# Patient Record
Sex: Female | Born: 1945 | Race: White | Hispanic: No | Smoking: Former smoker
Health system: Southern US, Community
[De-identification: ages and names within clinical notes are randomized; demographics above are authoritative.]

## PROBLEM LIST (undated history)

## (undated) DIAGNOSIS — J45909 Unspecified asthma, uncomplicated: Secondary | ICD-10-CM

## (undated) DIAGNOSIS — R413 Other amnesia: Secondary | ICD-10-CM

## (undated) DIAGNOSIS — F32A Depression, unspecified: Secondary | ICD-10-CM

## (undated) DIAGNOSIS — M549 Dorsalgia, unspecified: Secondary | ICD-10-CM

## (undated) DIAGNOSIS — G3184 Mild cognitive impairment, so stated: Secondary | ICD-10-CM

## (undated) DIAGNOSIS — R296 Repeated falls: Secondary | ICD-10-CM

## (undated) DIAGNOSIS — F419 Anxiety disorder, unspecified: Secondary | ICD-10-CM

## (undated) DIAGNOSIS — E039 Hypothyroidism, unspecified: Secondary | ICD-10-CM

## (undated) DIAGNOSIS — I10 Essential (primary) hypertension: Secondary | ICD-10-CM

## (undated) DIAGNOSIS — K529 Noninfective gastroenteritis and colitis, unspecified: Secondary | ICD-10-CM

## (undated) DIAGNOSIS — T7840XA Allergy, unspecified, initial encounter: Secondary | ICD-10-CM

## (undated) DIAGNOSIS — K219 Gastro-esophageal reflux disease without esophagitis: Secondary | ICD-10-CM

## (undated) DIAGNOSIS — F329 Major depressive disorder, single episode, unspecified: Secondary | ICD-10-CM

## (undated) DIAGNOSIS — E785 Hyperlipidemia, unspecified: Secondary | ICD-10-CM

## (undated) DIAGNOSIS — R011 Cardiac murmur, unspecified: Secondary | ICD-10-CM

## (undated) DIAGNOSIS — N281 Cyst of kidney, acquired: Secondary | ICD-10-CM

## (undated) DIAGNOSIS — Z8719 Personal history of other diseases of the digestive system: Secondary | ICD-10-CM

## (undated) DIAGNOSIS — K5790 Diverticulosis of intestine, part unspecified, without perforation or abscess without bleeding: Secondary | ICD-10-CM

## (undated) DIAGNOSIS — T8859XA Other complications of anesthesia, initial encounter: Secondary | ICD-10-CM

## (undated) DIAGNOSIS — K635 Polyp of colon: Secondary | ICD-10-CM

## (undated) DIAGNOSIS — F988 Other specified behavioral and emotional disorders with onset usually occurring in childhood and adolescence: Secondary | ICD-10-CM

## (undated) DIAGNOSIS — T4145XA Adverse effect of unspecified anesthetic, initial encounter: Secondary | ICD-10-CM

## (undated) DIAGNOSIS — M1990 Unspecified osteoarthritis, unspecified site: Secondary | ICD-10-CM

## (undated) DIAGNOSIS — E079 Disorder of thyroid, unspecified: Secondary | ICD-10-CM

## (undated) DIAGNOSIS — K589 Irritable bowel syndrome without diarrhea: Secondary | ICD-10-CM

## (undated) HISTORY — PX: HIP FRACTURE SURGERY: SHX118

## (undated) HISTORY — DX: Polyp of colon: K63.5

## (undated) HISTORY — PX: COLONOSCOPY: SHX174

## (undated) HISTORY — PX: CARPAL TUNNEL RELEASE: SHX101

## (undated) HISTORY — DX: Repeated falls: R29.6

## (undated) HISTORY — DX: Other amnesia: R41.3

## (undated) HISTORY — DX: Other specified behavioral and emotional disorders with onset usually occurring in childhood and adolescence: F98.8

## (undated) HISTORY — DX: Depression, unspecified: F32.A

## (undated) HISTORY — DX: Essential (primary) hypertension: I10

## (undated) HISTORY — DX: Hyperlipidemia, unspecified: E78.5

## (undated) HISTORY — DX: Disorder of thyroid, unspecified: E07.9

## (undated) HISTORY — PX: BREAST SURGERY: SHX581

## (undated) HISTORY — DX: Unspecified osteoarthritis, unspecified site: M19.90

## (undated) HISTORY — DX: Mild cognitive impairment of uncertain or unknown etiology: G31.84

## (undated) HISTORY — DX: Anxiety disorder, unspecified: F41.9

## (undated) HISTORY — DX: Irritable bowel syndrome, unspecified: K58.9

## (undated) HISTORY — PX: CHOLECYSTECTOMY: SHX55

## (undated) HISTORY — DX: Unspecified asthma, uncomplicated: J45.909

## (undated) HISTORY — DX: Allergy, unspecified, initial encounter: T78.40XA

## (undated) HISTORY — DX: Major depressive disorder, single episode, unspecified: F32.9

## (undated) HISTORY — DX: Gastro-esophageal reflux disease without esophagitis: K21.9

## (undated) HISTORY — DX: Noninfective gastroenteritis and colitis, unspecified: K52.9

## (undated) HISTORY — PX: TOE SURGERY: SHX1073

## (undated) HISTORY — DX: Diverticulosis of intestine, part unspecified, without perforation or abscess without bleeding: K57.90

## (undated) HISTORY — PX: BACK SURGERY: SHX140

## (undated) HISTORY — PX: SHOULDER SURGERY: SHX246

## (undated) HISTORY — PX: TOTAL SHOULDER ARTHROPLASTY: SHX126

---

## 1997-09-28 ENCOUNTER — Ambulatory Visit (HOSPITAL_COMMUNITY): Admission: RE | Admit: 1997-09-28 | Discharge: 1997-09-28 | Payer: Self-pay | Admitting: Neurosurgery

## 1998-09-27 ENCOUNTER — Other Ambulatory Visit: Admission: RE | Admit: 1998-09-27 | Discharge: 1998-09-27 | Payer: Self-pay | Admitting: Gynecology

## 1998-11-28 ENCOUNTER — Ambulatory Visit (HOSPITAL_COMMUNITY): Admission: RE | Admit: 1998-11-28 | Discharge: 1998-11-28 | Payer: Self-pay | Admitting: Gynecology

## 1999-09-07 ENCOUNTER — Encounter: Payer: Self-pay | Admitting: Internal Medicine

## 1999-09-07 ENCOUNTER — Ambulatory Visit (HOSPITAL_COMMUNITY): Admission: RE | Admit: 1999-09-07 | Discharge: 1999-09-07 | Payer: Self-pay | Admitting: Internal Medicine

## 1999-10-12 ENCOUNTER — Other Ambulatory Visit: Admission: RE | Admit: 1999-10-12 | Discharge: 1999-10-12 | Payer: Self-pay | Admitting: Gynecology

## 1999-10-12 ENCOUNTER — Encounter (INDEPENDENT_AMBULATORY_CARE_PROVIDER_SITE_OTHER): Payer: Self-pay

## 2000-03-23 ENCOUNTER — Ambulatory Visit (HOSPITAL_COMMUNITY): Admission: RE | Admit: 2000-03-23 | Discharge: 2000-03-23 | Payer: Self-pay | Admitting: Neurosurgery

## 2000-03-23 ENCOUNTER — Encounter: Payer: Self-pay | Admitting: Neurosurgery

## 2000-03-25 ENCOUNTER — Encounter: Admission: RE | Admit: 2000-03-25 | Discharge: 2000-03-25 | Payer: Self-pay | Admitting: Neurosurgery

## 2000-03-25 ENCOUNTER — Encounter: Payer: Self-pay | Admitting: Neurosurgery

## 2000-04-01 ENCOUNTER — Ambulatory Visit (HOSPITAL_COMMUNITY): Admission: RE | Admit: 2000-04-01 | Discharge: 2000-04-01 | Payer: Self-pay | Admitting: Neurosurgery

## 2000-04-01 ENCOUNTER — Encounter: Payer: Self-pay | Admitting: Neurosurgery

## 2000-04-15 ENCOUNTER — Encounter: Payer: Self-pay | Admitting: Neurosurgery

## 2000-04-15 ENCOUNTER — Ambulatory Visit (HOSPITAL_COMMUNITY): Admission: RE | Admit: 2000-04-15 | Discharge: 2000-04-15 | Payer: Self-pay | Admitting: Neurosurgery

## 2000-04-29 ENCOUNTER — Encounter: Payer: Self-pay | Admitting: Neurosurgery

## 2000-04-29 ENCOUNTER — Ambulatory Visit (HOSPITAL_COMMUNITY): Admission: RE | Admit: 2000-04-29 | Discharge: 2000-04-29 | Payer: Self-pay | Admitting: Neurosurgery

## 2001-08-26 ENCOUNTER — Ambulatory Visit (HOSPITAL_COMMUNITY): Admission: RE | Admit: 2001-08-26 | Discharge: 2001-08-26 | Payer: Self-pay | Admitting: Orthopedic Surgery

## 2001-08-26 ENCOUNTER — Encounter: Payer: Self-pay | Admitting: Orthopedic Surgery

## 2003-08-23 ENCOUNTER — Encounter: Admission: RE | Admit: 2003-08-23 | Discharge: 2003-08-23 | Payer: Self-pay | Admitting: Orthopedic Surgery

## 2003-11-10 ENCOUNTER — Encounter: Admission: RE | Admit: 2003-11-10 | Discharge: 2003-11-10 | Payer: Self-pay | Admitting: Orthopedic Surgery

## 2004-05-02 ENCOUNTER — Ambulatory Visit: Payer: Self-pay | Admitting: Internal Medicine

## 2004-10-11 ENCOUNTER — Ambulatory Visit: Payer: Self-pay | Admitting: Internal Medicine

## 2004-12-21 ENCOUNTER — Ambulatory Visit: Payer: Self-pay | Admitting: Internal Medicine

## 2005-01-21 ENCOUNTER — Ambulatory Visit: Payer: Self-pay | Admitting: Gastroenterology

## 2005-01-24 ENCOUNTER — Ambulatory Visit: Payer: Self-pay | Admitting: Gastroenterology

## 2005-01-24 ENCOUNTER — Ambulatory Visit (HOSPITAL_COMMUNITY): Admission: RE | Admit: 2005-01-24 | Discharge: 2005-01-24 | Payer: Self-pay | Admitting: Gastroenterology

## 2005-02-12 ENCOUNTER — Ambulatory Visit: Payer: Self-pay | Admitting: Gastroenterology

## 2005-02-19 ENCOUNTER — Ambulatory Visit: Payer: Self-pay | Admitting: Gastroenterology

## 2005-02-19 ENCOUNTER — Encounter (INDEPENDENT_AMBULATORY_CARE_PROVIDER_SITE_OTHER): Payer: Self-pay | Admitting: *Deleted

## 2005-02-23 ENCOUNTER — Emergency Department (HOSPITAL_COMMUNITY): Admission: EM | Admit: 2005-02-23 | Discharge: 2005-02-23 | Payer: Self-pay | Admitting: Emergency Medicine

## 2005-02-26 ENCOUNTER — Ambulatory Visit (HOSPITAL_COMMUNITY): Admission: RE | Admit: 2005-02-26 | Discharge: 2005-02-27 | Payer: Self-pay | Admitting: General Surgery

## 2005-02-26 ENCOUNTER — Encounter (INDEPENDENT_AMBULATORY_CARE_PROVIDER_SITE_OTHER): Payer: Self-pay | Admitting: *Deleted

## 2005-12-19 ENCOUNTER — Other Ambulatory Visit: Admission: RE | Admit: 2005-12-19 | Discharge: 2005-12-19 | Payer: Self-pay | Admitting: Gynecology

## 2005-12-23 ENCOUNTER — Encounter: Payer: Self-pay | Admitting: Internal Medicine

## 2006-01-06 ENCOUNTER — Ambulatory Visit: Payer: Self-pay | Admitting: Internal Medicine

## 2006-06-04 ENCOUNTER — Ambulatory Visit: Payer: Self-pay | Admitting: Internal Medicine

## 2006-11-19 ENCOUNTER — Ambulatory Visit: Payer: Self-pay | Admitting: Internal Medicine

## 2006-11-21 ENCOUNTER — Ambulatory Visit: Payer: Self-pay | Admitting: Internal Medicine

## 2006-11-21 LAB — CONVERTED CEMR LAB
Basophils Relative: 0.1 % (ref 0.0–1.0)
Eosinophils Absolute: 0.4 10*3/uL (ref 0.0–0.6)
HCT: 35.6 % — ABNORMAL LOW (ref 36.0–46.0)
Hemoglobin: 12.1 g/dL (ref 12.0–15.0)
Lymphocytes Relative: 20.2 % (ref 12.0–46.0)
MCHC: 33.9 g/dL (ref 30.0–36.0)
MCV: 90.9 fL (ref 78.0–100.0)
Monocytes Absolute: 0.4 10*3/uL (ref 0.2–0.7)
Neutro Abs: 4.3 10*3/uL (ref 1.4–7.7)
RDW: 12.4 % (ref 11.5–14.6)
Sed Rate: 2 mm/hr (ref 0–22)
WBC: 6.4 10*3/uL (ref 4.5–10.5)

## 2007-01-28 ENCOUNTER — Ambulatory Visit: Payer: Self-pay | Admitting: Psychology

## 2007-01-28 ENCOUNTER — Ambulatory Visit: Payer: Self-pay | Admitting: Internal Medicine

## 2007-01-30 ENCOUNTER — Ambulatory Visit: Payer: Self-pay | Admitting: Licensed Clinical Social Worker

## 2007-02-03 ENCOUNTER — Ambulatory Visit: Payer: Self-pay | Admitting: Licensed Clinical Social Worker

## 2007-02-06 ENCOUNTER — Ambulatory Visit: Payer: Self-pay | Admitting: Licensed Clinical Social Worker

## 2007-02-10 ENCOUNTER — Ambulatory Visit: Payer: Self-pay | Admitting: Licensed Clinical Social Worker

## 2007-02-12 ENCOUNTER — Ambulatory Visit: Payer: Self-pay | Admitting: Licensed Clinical Social Worker

## 2007-02-17 ENCOUNTER — Ambulatory Visit: Payer: Self-pay | Admitting: Licensed Clinical Social Worker

## 2007-02-20 ENCOUNTER — Ambulatory Visit: Payer: Self-pay | Admitting: Licensed Clinical Social Worker

## 2007-03-11 ENCOUNTER — Ambulatory Visit: Payer: Self-pay | Admitting: Licensed Clinical Social Worker

## 2007-03-16 ENCOUNTER — Ambulatory Visit: Payer: Self-pay | Admitting: Licensed Clinical Social Worker

## 2007-03-18 ENCOUNTER — Ambulatory Visit: Payer: Self-pay | Admitting: Licensed Clinical Social Worker

## 2007-03-20 ENCOUNTER — Encounter: Payer: Self-pay | Admitting: Internal Medicine

## 2007-03-20 ENCOUNTER — Ambulatory Visit: Payer: Self-pay | Admitting: Internal Medicine

## 2007-03-21 DIAGNOSIS — F988 Other specified behavioral and emotional disorders with onset usually occurring in childhood and adolescence: Secondary | ICD-10-CM

## 2007-03-21 DIAGNOSIS — K589 Irritable bowel syndrome without diarrhea: Secondary | ICD-10-CM

## 2007-03-21 DIAGNOSIS — Z8679 Personal history of other diseases of the circulatory system: Secondary | ICD-10-CM | POA: Insufficient documentation

## 2007-03-21 DIAGNOSIS — F411 Generalized anxiety disorder: Secondary | ICD-10-CM

## 2007-03-21 DIAGNOSIS — E039 Hypothyroidism, unspecified: Secondary | ICD-10-CM

## 2007-03-21 DIAGNOSIS — F329 Major depressive disorder, single episode, unspecified: Secondary | ICD-10-CM

## 2007-03-21 DIAGNOSIS — I10 Essential (primary) hypertension: Secondary | ICD-10-CM

## 2007-03-21 DIAGNOSIS — R1013 Epigastric pain: Secondary | ICD-10-CM

## 2007-03-21 DIAGNOSIS — J45909 Unspecified asthma, uncomplicated: Secondary | ICD-10-CM

## 2007-03-21 DIAGNOSIS — K3189 Other diseases of stomach and duodenum: Secondary | ICD-10-CM

## 2007-03-23 ENCOUNTER — Ambulatory Visit: Payer: Self-pay | Admitting: Licensed Clinical Social Worker

## 2007-03-31 ENCOUNTER — Ambulatory Visit: Payer: Self-pay | Admitting: Licensed Clinical Social Worker

## 2007-04-02 ENCOUNTER — Ambulatory Visit: Payer: Self-pay | Admitting: Internal Medicine

## 2007-04-04 ENCOUNTER — Encounter: Payer: Self-pay | Admitting: Internal Medicine

## 2007-04-04 LAB — CONVERTED CEMR LAB: Dopamine 24 Hr Urine: 202 mcg/24hr (ref <500)

## 2007-04-15 ENCOUNTER — Encounter: Payer: Self-pay | Admitting: Internal Medicine

## 2007-04-15 ENCOUNTER — Other Ambulatory Visit: Admission: RE | Admit: 2007-04-15 | Discharge: 2007-04-15 | Payer: Self-pay | Admitting: Gynecology

## 2007-04-15 ENCOUNTER — Ambulatory Visit: Payer: Self-pay | Admitting: Internal Medicine

## 2007-04-17 ENCOUNTER — Ambulatory Visit: Payer: Self-pay | Admitting: Licensed Clinical Social Worker

## 2007-04-21 ENCOUNTER — Encounter: Admission: RE | Admit: 2007-04-21 | Discharge: 2007-04-21 | Payer: Self-pay | Admitting: Internal Medicine

## 2007-04-29 ENCOUNTER — Ambulatory Visit: Payer: Self-pay | Admitting: Licensed Clinical Social Worker

## 2007-05-08 ENCOUNTER — Ambulatory Visit: Payer: Self-pay | Admitting: Licensed Clinical Social Worker

## 2007-05-19 ENCOUNTER — Telehealth: Payer: Self-pay | Admitting: Internal Medicine

## 2007-05-25 ENCOUNTER — Ambulatory Visit: Payer: Self-pay | Admitting: Licensed Clinical Social Worker

## 2007-05-26 ENCOUNTER — Telehealth: Payer: Self-pay | Admitting: Internal Medicine

## 2007-05-26 ENCOUNTER — Ambulatory Visit: Payer: Self-pay | Admitting: Internal Medicine

## 2007-05-26 DIAGNOSIS — J309 Allergic rhinitis, unspecified: Secondary | ICD-10-CM | POA: Insufficient documentation

## 2007-05-28 ENCOUNTER — Telehealth: Payer: Self-pay | Admitting: Internal Medicine

## 2007-07-20 ENCOUNTER — Telehealth: Payer: Self-pay | Admitting: Internal Medicine

## 2007-09-16 ENCOUNTER — Telehealth: Payer: Self-pay | Admitting: Internal Medicine

## 2007-09-28 ENCOUNTER — Telehealth: Payer: Self-pay | Admitting: Internal Medicine

## 2007-10-12 ENCOUNTER — Encounter: Payer: Self-pay | Admitting: Internal Medicine

## 2007-10-30 ENCOUNTER — Telehealth: Payer: Self-pay | Admitting: Internal Medicine

## 2007-11-20 ENCOUNTER — Telehealth: Payer: Self-pay | Admitting: Internal Medicine

## 2008-01-12 ENCOUNTER — Telehealth: Payer: Self-pay | Admitting: Internal Medicine

## 2008-01-13 ENCOUNTER — Telehealth: Payer: Self-pay | Admitting: Internal Medicine

## 2008-01-15 ENCOUNTER — Ambulatory Visit: Payer: Self-pay | Admitting: Internal Medicine

## 2008-01-15 ENCOUNTER — Observation Stay (HOSPITAL_COMMUNITY): Admission: EM | Admit: 2008-01-15 | Discharge: 2008-01-16 | Payer: Self-pay | Admitting: *Deleted

## 2008-02-02 ENCOUNTER — Ambulatory Visit: Payer: Self-pay | Admitting: Internal Medicine

## 2008-02-02 ENCOUNTER — Telehealth: Payer: Self-pay | Admitting: Internal Medicine

## 2008-02-02 ENCOUNTER — Other Ambulatory Visit (HOSPITAL_COMMUNITY): Admission: RE | Admit: 2008-02-02 | Discharge: 2008-02-22 | Payer: Self-pay | Admitting: Psychiatry

## 2008-02-02 LAB — CONVERTED CEMR LAB
BUN: 18 mg/dL (ref 6–23)
Basophils Absolute: 0.1 10*3/uL (ref 0.0–0.1)
CO2: 29 meq/L (ref 19–32)
Calcium: 9.3 mg/dL (ref 8.4–10.5)
Chloride: 105 meq/L (ref 96–112)
Eosinophils Absolute: 0.4 10*3/uL (ref 0.0–0.7)
Eosinophils Relative: 5.1 % — ABNORMAL HIGH (ref 0.0–5.0)
Glucose, Bld: 87 mg/dL (ref 70–99)
HDL: 39.4 mg/dL (ref >39.0)
Hemoglobin: 13.2 g/dL (ref 12.0–15.0)
Lymphocytes Relative: 21.6 % (ref 12.0–46.0)
MCHC: 34.2 g/dL (ref 30.0–36.0)
Monocytes Absolute: 0.6 10*3/uL (ref 0.1–1.0)
Neutrophils Relative %: 64.6 % (ref 43.0–77.0)
Sodium: 140 meq/L (ref 135–145)
Total CHOL/HDL Ratio: 5.7
VLDL: 18 mg/dL (ref 0–40)

## 2008-02-03 ENCOUNTER — Ambulatory Visit: Payer: Self-pay | Admitting: Psychiatry

## 2008-02-05 ENCOUNTER — Encounter: Payer: Self-pay | Admitting: Internal Medicine

## 2008-02-19 ENCOUNTER — Ambulatory Visit: Payer: Self-pay | Admitting: Internal Medicine

## 2008-02-19 DIAGNOSIS — E785 Hyperlipidemia, unspecified: Secondary | ICD-10-CM

## 2008-05-06 ENCOUNTER — Telehealth: Payer: Self-pay | Admitting: Internal Medicine

## 2008-05-12 ENCOUNTER — Ambulatory Visit: Payer: Self-pay | Admitting: Internal Medicine

## 2008-05-12 ENCOUNTER — Telehealth: Payer: Self-pay | Admitting: Internal Medicine

## 2008-05-12 LAB — CONVERTED CEMR LAB
Bilirubin Urine: NEGATIVE
Crystals: NEGATIVE
Hemoglobin, Urine: NEGATIVE
Ketones, ur: NEGATIVE mg/dL
Urine Glucose: NEGATIVE mg/dL
Urobilinogen, UA: 0.2 (ref 0.0–1.0)

## 2008-05-17 ENCOUNTER — Telehealth: Payer: Self-pay | Admitting: Internal Medicine

## 2008-05-18 ENCOUNTER — Ambulatory Visit: Payer: Self-pay | Admitting: Internal Medicine

## 2008-05-18 LAB — CONVERTED CEMR LAB
Bacteria, UA: NEGATIVE
Bilirubin Urine: NEGATIVE
Hemoglobin, Urine: NEGATIVE
Ketones, ur: NEGATIVE mg/dL
Mucus, UA: NEGATIVE
RBC / HPF: NONE SEEN
Total Protein, Urine: NEGATIVE mg/dL

## 2008-05-25 ENCOUNTER — Telehealth: Payer: Self-pay | Admitting: Internal Medicine

## 2008-05-26 ENCOUNTER — Ambulatory Visit: Payer: Self-pay | Admitting: Internal Medicine

## 2008-05-26 LAB — CONVERTED CEMR LAB
Bilirubin Urine: NEGATIVE
Total Protein, Urine: NEGATIVE mg/dL
Urine Glucose: NEGATIVE mg/dL
Urobilinogen, UA: 0.2 (ref 0.0–1.0)

## 2008-05-27 ENCOUNTER — Encounter: Payer: Self-pay | Admitting: Internal Medicine

## 2008-05-30 ENCOUNTER — Other Ambulatory Visit: Admission: RE | Admit: 2008-05-30 | Discharge: 2008-05-30 | Payer: Self-pay | Admitting: Gynecology

## 2008-05-30 ENCOUNTER — Encounter: Payer: Self-pay | Admitting: Gynecology

## 2008-05-30 ENCOUNTER — Ambulatory Visit: Payer: Self-pay | Admitting: Gynecology

## 2008-06-02 ENCOUNTER — Telehealth: Payer: Self-pay | Admitting: Internal Medicine

## 2008-06-27 ENCOUNTER — Telehealth (INDEPENDENT_AMBULATORY_CARE_PROVIDER_SITE_OTHER): Payer: Self-pay | Admitting: *Deleted

## 2008-06-28 ENCOUNTER — Ambulatory Visit: Payer: Self-pay | Admitting: Internal Medicine

## 2008-07-22 ENCOUNTER — Telehealth: Payer: Self-pay | Admitting: Internal Medicine

## 2008-08-10 ENCOUNTER — Encounter: Payer: Self-pay | Admitting: Internal Medicine

## 2008-08-10 ENCOUNTER — Telehealth: Payer: Self-pay | Admitting: Internal Medicine

## 2008-09-14 ENCOUNTER — Telehealth: Payer: Self-pay | Admitting: Internal Medicine

## 2008-09-15 ENCOUNTER — Encounter: Payer: Self-pay | Admitting: Internal Medicine

## 2008-09-23 LAB — CONVERTED CEMR LAB: Pap Smear: NORMAL

## 2008-10-05 ENCOUNTER — Telehealth: Payer: Self-pay | Admitting: Internal Medicine

## 2008-10-24 ENCOUNTER — Telehealth: Payer: Self-pay | Admitting: Internal Medicine

## 2008-10-30 ENCOUNTER — Encounter: Payer: Self-pay | Admitting: Internal Medicine

## 2008-11-09 ENCOUNTER — Telehealth: Payer: Self-pay | Admitting: Internal Medicine

## 2008-11-25 ENCOUNTER — Ambulatory Visit (HOSPITAL_COMMUNITY): Admission: RE | Admit: 2008-11-25 | Discharge: 2008-11-28 | Payer: Self-pay | Admitting: Orthopedic Surgery

## 2008-11-26 ENCOUNTER — Ambulatory Visit: Payer: Self-pay | Admitting: Internal Medicine

## 2008-11-30 ENCOUNTER — Telehealth: Payer: Self-pay | Admitting: Internal Medicine

## 2009-01-13 ENCOUNTER — Encounter: Payer: Self-pay | Admitting: Internal Medicine

## 2009-01-16 ENCOUNTER — Telehealth: Payer: Self-pay | Admitting: Internal Medicine

## 2009-01-18 ENCOUNTER — Telehealth: Payer: Self-pay | Admitting: Internal Medicine

## 2009-01-19 ENCOUNTER — Encounter: Payer: Self-pay | Admitting: Internal Medicine

## 2009-02-14 ENCOUNTER — Telehealth: Payer: Self-pay | Admitting: Internal Medicine

## 2009-03-08 ENCOUNTER — Telehealth: Payer: Self-pay | Admitting: Internal Medicine

## 2009-03-14 ENCOUNTER — Telehealth: Payer: Self-pay | Admitting: Internal Medicine

## 2009-03-22 ENCOUNTER — Ambulatory Visit: Payer: Self-pay | Admitting: Internal Medicine

## 2009-03-28 ENCOUNTER — Encounter: Admission: RE | Admit: 2009-03-28 | Discharge: 2009-03-28 | Payer: Self-pay | Admitting: Orthopedic Surgery

## 2009-03-29 ENCOUNTER — Ambulatory Visit: Payer: Self-pay | Admitting: Internal Medicine

## 2009-03-29 LAB — CONVERTED CEMR LAB
BUN: 12 mg/dL (ref 6–23)
Basophils Absolute: 0.1 10*3/uL (ref 0.0–0.1)
Basophils Relative: 0.8 % (ref 0.0–3.0)
CO2: 29 meq/L (ref 19–32)
Calcium: 9.5 mg/dL (ref 8.4–10.5)
Creatinine, Ser: 1 mg/dL (ref 0.4–1.2)
Eosinophils Absolute: 0.3 10*3/uL (ref 0.0–0.7)
GFR calc non Af Amer: 59.46 mL/min (ref >60)
HCT: 39.7 % (ref 36.0–46.0)
Hemoglobin: 13.5 g/dL (ref 12.0–15.0)
Lymphs Abs: 1.8 10*3/uL (ref 0.7–4.0)
MCV: 88.2 fL (ref 78.0–100.0)
Monocytes Absolute: 0.7 10*3/uL (ref 0.1–1.0)
Monocytes Relative: 9.1 % (ref 3.0–12.0)
Neutro Abs: 4.7 10*3/uL (ref 1.4–7.7)
Neutrophils Relative %: 62.5 % (ref 43.0–77.0)
Sodium: 141 meq/L (ref 135–145)
TSH: 2.22 microintl units/mL (ref 0.35–5.50)
Total CHOL/HDL Ratio: 3
Total Protein: 6.5 g/dL (ref 6.0–8.3)
Triglycerides: 68 mg/dL (ref 0.0–149.0)
VLDL: 13.6 mg/dL (ref 0.0–40.0)

## 2009-05-02 ENCOUNTER — Ambulatory Visit: Payer: Self-pay | Admitting: Internal Medicine

## 2009-05-02 LAB — CONVERTED CEMR LAB
Basophils Absolute: 0.1 10*3/uL (ref 0.0–0.1)
Basophils Relative: 1.5 % (ref 0.0–3.0)
CO2: 29 meq/L (ref 19–32)
Creatinine, Ser: 0.9 mg/dL (ref 0.4–1.2)
GFR calc non Af Amer: 67.12 mL/min (ref >60)
Glucose, Bld: 101 mg/dL — ABNORMAL HIGH (ref 70–99)
Lymphocytes Relative: 20.5 % (ref 12.0–46.0)
Lymphs Abs: 1.4 10*3/uL (ref 0.7–4.0)
MCHC: 34 g/dL (ref 30.0–36.0)
Monocytes Absolute: 0.7 10*3/uL (ref 0.1–1.0)
Monocytes Relative: 11 % (ref 3.0–12.0)
Neutrophils Relative %: 60.8 % (ref 43.0–77.0)
WBC: 6.6 10*3/uL (ref 4.5–10.5)

## 2009-05-03 ENCOUNTER — Ambulatory Visit: Payer: Self-pay | Admitting: Internal Medicine

## 2009-05-04 ENCOUNTER — Telehealth: Payer: Self-pay | Admitting: Internal Medicine

## 2009-05-10 ENCOUNTER — Telehealth: Payer: Self-pay | Admitting: Internal Medicine

## 2009-06-14 ENCOUNTER — Telehealth (INDEPENDENT_AMBULATORY_CARE_PROVIDER_SITE_OTHER): Payer: Self-pay | Admitting: *Deleted

## 2009-06-22 ENCOUNTER — Telehealth: Payer: Self-pay | Admitting: Internal Medicine

## 2009-09-01 ENCOUNTER — Telehealth: Payer: Self-pay | Admitting: Internal Medicine

## 2009-09-13 ENCOUNTER — Telehealth: Payer: Self-pay | Admitting: Internal Medicine

## 2009-10-20 ENCOUNTER — Telehealth: Payer: Self-pay | Admitting: Internal Medicine

## 2009-10-24 ENCOUNTER — Ambulatory Visit: Payer: Self-pay | Admitting: Internal Medicine

## 2009-11-29 ENCOUNTER — Telehealth: Payer: Self-pay | Admitting: Internal Medicine

## 2009-12-21 ENCOUNTER — Ambulatory Visit: Payer: Self-pay | Admitting: Internal Medicine

## 2009-12-21 LAB — CONVERTED CEMR LAB
Anti Nuclear Antibody(ANA): NEGATIVE
Sed Rate: 5 mm/hr (ref 0–22)

## 2009-12-23 ENCOUNTER — Encounter: Payer: Self-pay | Admitting: Internal Medicine

## 2010-01-22 ENCOUNTER — Telehealth: Payer: Self-pay | Admitting: Internal Medicine

## 2010-01-24 ENCOUNTER — Encounter: Payer: Self-pay | Admitting: Gastroenterology

## 2010-01-25 ENCOUNTER — Ambulatory Visit: Payer: Self-pay | Admitting: Internal Medicine

## 2010-01-28 ENCOUNTER — Telehealth: Payer: Self-pay | Admitting: Internal Medicine

## 2010-02-06 ENCOUNTER — Encounter: Payer: Self-pay | Admitting: Internal Medicine

## 2010-02-08 ENCOUNTER — Encounter: Payer: Self-pay | Admitting: Internal Medicine

## 2010-02-22 ENCOUNTER — Telehealth: Payer: Self-pay | Admitting: Internal Medicine

## 2010-02-28 ENCOUNTER — Telehealth: Payer: Self-pay | Admitting: Internal Medicine

## 2010-03-01 ENCOUNTER — Ambulatory Visit: Payer: Self-pay | Admitting: Internal Medicine

## 2010-03-01 DIAGNOSIS — R109 Unspecified abdominal pain: Secondary | ICD-10-CM | POA: Insufficient documentation

## 2010-04-02 ENCOUNTER — Telehealth: Payer: Self-pay | Admitting: Internal Medicine

## 2010-04-06 ENCOUNTER — Ambulatory Visit: Payer: Self-pay | Admitting: Internal Medicine

## 2010-04-06 ENCOUNTER — Telehealth: Payer: Self-pay | Admitting: Internal Medicine

## 2010-04-06 LAB — CONVERTED CEMR LAB
Bilirubin Urine: NEGATIVE
Ketones, ur: NEGATIVE mg/dL
Nitrite: POSITIVE
Urobilinogen, UA: 0.2 (ref 0.0–1.0)
pH: 6 (ref 5.0–8.0)

## 2010-04-18 ENCOUNTER — Telehealth: Payer: Self-pay | Admitting: Internal Medicine

## 2010-06-06 ENCOUNTER — Telehealth: Payer: Self-pay | Admitting: Internal Medicine

## 2010-06-08 ENCOUNTER — Telehealth: Payer: Self-pay | Admitting: Internal Medicine

## 2010-06-19 ENCOUNTER — Telehealth: Payer: Self-pay | Admitting: Internal Medicine

## 2010-06-27 ENCOUNTER — Telehealth: Payer: Self-pay | Admitting: Internal Medicine

## 2010-07-02 ENCOUNTER — Telehealth: Payer: Self-pay | Admitting: Internal Medicine

## 2010-07-04 ENCOUNTER — Telehealth: Payer: Self-pay | Admitting: Internal Medicine

## 2010-07-05 ENCOUNTER — Other Ambulatory Visit: Payer: Self-pay | Admitting: Internal Medicine

## 2010-07-05 ENCOUNTER — Ambulatory Visit
Admission: RE | Admit: 2010-07-05 | Discharge: 2010-07-05 | Payer: Self-pay | Source: Home / Self Care | Attending: Internal Medicine | Admitting: Internal Medicine

## 2010-07-05 ENCOUNTER — Telehealth: Payer: Self-pay | Admitting: Internal Medicine

## 2010-07-05 LAB — BASIC METABOLIC PANEL
BUN: 11 mg/dL (ref 6–23)
CO2: 27 mEq/L (ref 19–32)
Calcium: 8.8 mg/dL (ref 8.4–10.5)
Chloride: 104 mEq/L (ref 96–112)
Creatinine, Ser: 0.9 mg/dL (ref 0.4–1.2)
GFR: 68.63 mL/min (ref >60.00)
Glucose, Bld: 93 mg/dL (ref 70–99)
Potassium: 3.7 mEq/L (ref 3.5–5.1)
Sodium: 141 mEq/L (ref 135–145)

## 2010-07-05 LAB — TSH: TSH: 1.87 u[IU]/mL (ref 0.35–5.50)

## 2010-07-05 LAB — CBC WITH DIFFERENTIAL/PLATELET
Basophils Absolute: 0 10*3/uL (ref 0.0–0.1)
Basophils Relative: 0.7 % (ref 0.0–3.0)
Eosinophils Absolute: 0.2 10*3/uL (ref 0.0–0.7)
Eosinophils Relative: 2.8 % (ref 0.0–5.0)
HCT: 40 % (ref 36.0–46.0)
Hemoglobin: 13.5 g/dL (ref 12.0–15.0)
Lymphocytes Relative: 16.7 % (ref 12.0–46.0)
Lymphs Abs: 1.2 10*3/uL (ref 0.7–4.0)
MCHC: 33.8 g/dL (ref 30.0–36.0)
MCV: 91 fl (ref 78.0–100.0)
Monocytes Absolute: 0.5 10*3/uL (ref 0.1–1.0)
Monocytes Relative: 7.4 % (ref 3.0–12.0)
Neutro Abs: 5.1 10*3/uL (ref 1.4–7.7)
Neutrophils Relative %: 72.4 % (ref 43.0–77.0)
Platelets: 298 10*3/uL (ref 150.0–400.0)
RBC: 4.4 Mil/uL (ref 3.87–5.11)
RDW: 13.7 % (ref 11.5–14.6)
WBC: 7.1 10*3/uL (ref 4.5–10.5)

## 2010-07-06 ENCOUNTER — Telehealth: Payer: Self-pay | Admitting: Internal Medicine

## 2010-07-24 NOTE — Assessment & Plan Note (Signed)
Summary: abd pain /SD   Vital Signs:  Patient profile:   65 year old female Height:      69 inches Weight:      178 pounds BMI:     26.38 O2 Sat:      89 % on Room air Temp:     97.6 degrees F oral Pulse rate:   74 / minute BP sitting:   122 / 74  (left arm) Cuff size:   regular  Vitals Entered By: Glenda Chroman (March 01, 2010 4:41 PM)  O2 Flow:  Room air CC: pt has OV to discuss ongoing abdominal pain./cp sma    Primary Care Provider:  Norins  CC:  pt has OV to discuss ongoing abdominal pain./cp sma .  History of Present Illness: Patinet presents due to several year h/o abdominal pain and discomfort but worse over the past several weeks. She has a diagnosis of IBS but reports that hyoscymine has not helped. Chart reviewed: MR Abdomen in '08 negative. CT pelvis Oct '10 with question of diverticulitis otherwise normal. No intra-abdominal surgery.   She describes her discomfort as a dull grippe. She does not have bloating. She reports chronic constipation for which she is not taking a routine medication. She has had no hematochezia or melena. She has had a normal diet.   Current Medications (verified): 1)  Concerta 36 Mg Cr-Tabs (Methylphenidate Hcl) .[name].. 1 Tab Daily Fill On or After 03/22/2010 2)  Synthroid 100 Mcg  Tabs (Levothyroxine Sodium) .... Once Daily 3)  Ketoprofen Cr 200 Mg  Cp24 (Ketoprofen) .... Once Daily 4)  Prozac 20 Mg  Caps (Fluoxetine Hcl) .... By Mouth Once Daily 5)  Wellbutrin Xl 300 Mg  Tb24 (Bupropion Hcl) .... Once Daily 6)  Pantoprazole Sodium 40 Mg Tbec (Pantoprazole Sodium) .[name].. 1 Two Times A Day 7)  Neurontin 300 Mg  Caps (Gabapentin) .... Three Times A Day 8)  Maxair Autohaler 200 Mcg/inh  Aerb (Pirbuterol Acetate) .[name].. 1 or 2 Puffs As Needed 9)  Ambien 10 Mg  Tabs (Zolpidem Tartrate) .... At Bedtime As Needed 10)  Losartan Potassium-Hctz 50-12.5 Mg Tabs (Losartan Potassium-Hctz) .[name].. 1 By Mouth Once Daily 11)  Fluticasone Propionate 50  Mcg/act  Susp (Fluticasone Propionate) .[name].. 1 Spray/nares Daily 12)  Simvastatin 20 Mg Tabs (Simvastatin) .[name].. 1 By Mouth Qpm 13)  Alprazolam 0.5 Mg Tabs (Alprazolam) .... 0.5-1 By Mouth Three Times A Day Prn 14)  Hyoscyamine Sulfate 0.125 Mg Tabs (Hyoscyamine Sulfate) .[name].. 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 Judeen Hammans  Family History: Last updated: 2009/04/04 mother - deceased in her late 80's:COPD, cardiomyopathy, CAD Father-1915: HTN, chronic prosthetic hip replacement Neg- breast or colon cancer; DM  Social History: Last updated: 04/04/09 [facility]; post-graduate study [employer]. 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  The patient denies anorexia, fever, weight loss, weight gain, hoarseness, chest pain, peripheral edema, prolonged cough, melena, hematochezia, severe indigestion/heartburn, incontinence, difficulty walking, abnormal bleeding, and enlarged lymph nodes.    Physical Exam  General:  Heavyset white  female in no distress Head:  normocephalic and atraumatic.   Eyes:  C&S clear witout icterus Lungs:  normal respiratory effort and normal breath sounds.   Heart:  normal rate and regular rhythm.   Abdomen:  obese, BS hypoactive. soft, non-tender, no guarding, no rigidity, and no hepatomegaly.   Neurologic:  alert & oriented X3, cranial nerves II-XII intact, and gait normal.   Skin:  turgor normal, color normal, no rashes, and no suspicious lesions.   Psych:  Oriented X3, normally interactive, and good eye contact.     Impression & Recommendations:  Problem # 1:  ABDOMINAL PAIN, CHRONIC (ICD-789.00) Patinet with long-standing abdominal pain. She reports this is different from her IBS type bloating etc. She does admit to chronic constipation.  Plan - KUB           bulk laxative on a daily basis to improve bowel habit.           If x-ray ormal and she continues to have pain after a trial of bulk laxative will refer to GI.   Complete Medication List: 1)  Concerta 36 Mg Cr-tabs (Methylphenidate hcl) .[name].. 1 tab daily fill on or after 03/22/2010 2)  Synthroid 100 Mcg Tabs (Levothyroxine sodium) .... Once daily 3)  Ketoprofen Cr 200 Mg Cp24 (Ketoprofen) .... Once daily 4)  Prozac 20 Mg Caps (Fluoxetine hcl) .... By mouth once daily 5)  Wellbutrin Xl 300 Mg Tb24 (Bupropion hcl) .... Once daily 6)  Pantoprazole Sodium 40 Mg Tbec (Pantoprazole sodium) .[name].. 1 two times a day 7)  Neurontin 300 Mg Caps (Gabapentin) .... Three times a day 8)  Maxair Autohaler 200 Mcg/inh Aerb (Pirbuterol acetate) .[name].. 1 or 2 puffs as needed 9)  Ambien 10 Mg Tabs (Zolpidem tartrate) .... At bedtime as needed 10)  Losartan Potassium-hctz 50-12.5 Mg Tabs (Losartan potassium-hctz) .[name].. 1 by mouth once daily 11)  Fluticasone Propionate 50 Mcg/act Susp (Fluticasone propionate) .[name].. 1 spray/nares daily 12)  Simvastatin 20 Mg Tabs (Simvastatin) .[name].. 1 by mouth qpm 13)  Alprazolam 0.5 Mg Tabs (Alprazolam) .... 0.5-1 by  mouth three times a day prn 14)  Hyoscyamine Sulfate 0.125 Mg Tabs (Hyoscyamine sulfate) .[name].. 1 every 2 to 4 hours as needed  Other Orders: T-Abdomen 2-view ([id number])

## 2010-07-24 NOTE — Progress Notes (Signed)
Summary: concerta  Phone Note Refill Request Call back at [phone] Message from:  Patient on October 20, 2009 11:27 AM  Refills Requested: Medication #1:  Concerta  Patient called requesting a couple of refills on concerta to pick up or either a 40mosupply and appt. Please advise   Initial call taken by: LEstell HarpinCMA,  October 20, 2009 11:29 AM  Follow-up for Phone Call        OK to provide 30 day supply of concerta with 3 Rxs. Follow-up by: MNeena RhymesMD,  October 20, 2009 1:07 PM  Additional Follow-up for Phone Call Additional follow up Details #1::        waiting on md to sign Additional Follow-up by: Ami Bullins CMA,  October 20, 2009 4:33 PM    Additional Follow-up for Phone Call Additional follow up Details #2::    pt has appt tom with Dr NLinda Hedges prescriptions will be given to her at appt. Follow-up by: Ami Bullins CMA,  Oct 23, 2009 9:53 AM  New/Updated Medications: CONCERTA 36 MG CR-TABS (METHYLPHENIDATE HCL) 1 tab daily fill on or after 064/15/8309CONCERTA 36 MG CR-TABS (METHYLPHENIDATE HCL) 1 tab daily fill on or after 040/76/8088CONCERTA 36 MG CR-TABS (METHYLPHENIDATE HCL) 1 tab daily fill on or after 12/20/2009 Prescriptions: CONCERTA 36 MG CR-TABS (METHYLPHENIDATE HCL) 1 tab daily fill on or after 12/20/2009  #30 x 0   Entered by:   Ami Bullins CMA   Authorized by:   MNeena RhymesMD   Signed by:   ACharlynne CousinsCMA on 10/20/2009   Method used:   Print then Give to Patient   RxID:   [id number] 36 MG CR-TABS (METHYLPHENIDATE HCL) 1 tab daily fill on or after 11/19/2009  #30 x 0   Entered by:   Ami Bullins CMA   Authorized by:   MNeena RhymesMD   Signed by:   ACharlynne CousinsCMA on 10/20/2009   Method used:   Print then Give to Patient   RxID:   [id number] 36 MG CR-TABS (METHYLPHENIDATE HCL) 1 tab daily fill on or after 10/20/2009  #30 x 0   Entered by:   Ami Bullins CMA   Authorized by:   MNeena RhymesMD   Signed by:    ACharlynne CousinsCMA on 10/20/2009   Method used:   Print then Give to Patient   RxID:   [id number]

## 2010-07-24 NOTE — Letter (Signed)
Summary: Email from patient  Email from patient   Imported By: Lester [place] 03/01/2010 08:21:37  _____________________________________________________________________  External Attachment:    Type:   Image     Comment:   External Document

## 2010-07-24 NOTE — Assessment & Plan Note (Signed)
Summary: ?throat infection/cd   Vital Signs:  Patient profile:   65 year old female Height:      69 inches Weight:      179 pounds BMI:     26.53 O2 Sat:      96 % on Room air Temp:     98.0 degrees F oral Pulse rate:   78 / minute BP sitting:   128 / 72  (left arm) Cuff size:   regular  Vitals Entered By: Charlynne Cousins CMA (Oct 24, 2009 4:15 PM)  O2 Flow:  Room air CC: pt here with complaint of coughing, sore throat, drainage and fatigue x 2 months. With ears feeling clogged/ ab   Primary Care Provider:  Nehan Flaum  CC:  pt here with complaint of coughing, sore throat, and drainage and fatigue x 2 months. With ears feeling clogged/ ab.  History of Present Illness: The patient began having symptoms approximately three months ago.  Initially she had a severe sore throat.  She was treated by a physician in [place] at that time with a ten day course of antibiotics (unsure which type).  However, her symptoms have not improved since then.  Her primary complaint now is cough, which is worse in the morning and produces small quantities of brown-green sputum.  Other symptoms are a sensation of her ears being plugged and rhinorrhea.  She has tried Mucinex for her symptoms but is not currently taking anything for symptomatic relief.  She denies foreign travel, sick contacts, fever, chills.  She has lost approximately 5lbs over the past three months unintentionally.   Preventive Screening-Counseling & Management  Alcohol-Tobacco     Smoking Status: quit  Caffeine-Diet-Exercise     Does Patient Exercise: no  Current Medications (verified): 1)  Synthroid 100 Mcg  Tabs (Levothyroxine Sodium) .... Once Daily 2)  Ketoprofen Cr 200 Mg  Cp24 (Ketoprofen) .... Once Daily 3)  Prozac 20 Mg  Caps (Fluoxetine Hcl) .... By Mouth Once Daily 4)  Wellbutrin Xl 300 Mg  Tb24 (Bupropion Hcl) .... Once Daily 5)  Pantoprazole Sodium 40 Mg Tbec (Pantoprazole Sodium) .[name].. 1 Two Times A Day 6)  Neurontin 300 Mg   Caps (Gabapentin) .... Three Times A Day 7)  Maxair Autohaler 200 Mcg/inh  Aerb (Pirbuterol Acetate) .[name].. 1 or 2 Puffs As Needed 8)  Ambien 10 Mg  Tabs (Zolpidem Tartrate) .... At Bedtime As Needed 9)  Losartan Potassium-Hctz 50-12.5 Mg Tabs (Losartan Potassium-Hctz) .[name].. 1 By Mouth Once Daily 10)  Fluticasone Propionate 50 Mcg/act  Susp (Fluticasone Propionate) .[name].. 1 Spray/nares Daily 11)  Simvastatin 20 Mg Tabs (Simvastatin) .[name].. 1 By Mouth Qpm 12)  Concerta 36 Mg Cr-Tabs (Methylphenidate Hcl) .[name].. 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 Judeen Hammans  Family History: Last updated: 04/13/09 mother - deceased in her late 80's:COPD, cardiomyopathy, CAD Father-1915: HTN, chronic prosthetic hip replacement Neg- breast or colon cancer; DM  Social History: Last updated: Apr 13, 2009 [facility]; post-graduate study  [employer]. 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   Head:  normocephalic and atraumatic.   Eyes:  vision grossly  intact, pupils equal, pupils round, pupils reactive to light, and no injection.   Ears:  Cerumen present in bilateral ear canals.  No erythema or bulging of the TMs. Nose:  no external erythema, no nasal discharge, and no sinus percussion tenderness.   Mouth:  Mild pharyngeal erythema with no exudates. Neck:  supple, no masses, and no thyromegaly.   Lungs:  normal respiratory effort, no accessory muscle use, normal breath sounds, no crackles, and no wheezes.   Heart:  normal rate, regular rhythm, no murmur, no gallop, and no rub.   Cervical Nodes:  no anterior cervical adenopathy and no posterior cervical adenopathy.     Impression & Recommendations:  Problem # 1:  ALLERGIC RHINITIS CAUSE UNSPECIFIED (ICD-477.9) The patient's current symptoms are likely allergic in nature.  Recommended symptomatic treatment with Claritin, Robitussin DM, hydration, nasal saline, vitamin C and echinacea.    Problem # 2:  DEPRESSION (ICD-311) Patient endorses worsening of depression symptoms after the death of her dog. She denies SI.  She is followed regularly by a psychologist and has an appointment this week.    Her updated medication list for this problem includes:    Prozac 20 Mg Caps (Fluoxetine hcl) ..... By mouth once daily    Wellbutrin Xl 300 Mg Tb24 (Bupropion hcl) ..... Once daily  Complete Medication List: 1)  Synthroid 100 Mcg Tabs (Levothyroxine sodium) .... Once daily 2)  Ketoprofen Cr 200 Mg Cp24 (Ketoprofen) .... Once daily 3)  Prozac 20 Mg Caps (Fluoxetine hcl) .... By mouth once daily 4)  Wellbutrin Xl 300 Mg Tb24 (Bupropion hcl) .... Once daily 5)  Pantoprazole Sodium 40 Mg Tbec (Pantoprazole sodium) .[name].. 1 two times a day 6)  Neurontin 300 Mg Caps (Gabapentin) .... Three times a day 7)  Maxair Autohaler 200 Mcg/inh Aerb (Pirbuterol acetate) .[name].. 1 or 2 puffs as needed 8)  Ambien 10 Mg Tabs (Zolpidem tartrate) .... At bedtime as needed 9)  Losartan Potassium-hctz 50-12.5 Mg Tabs  (Losartan potassium-hctz) .[name].. 1 by mouth once daily 10)  Fluticasone Propionate 50 Mcg/act Susp (Fluticasone propionate) .[name].. 1 spray/nares daily 11)  Simvastatin 20 Mg Tabs (Simvastatin) .[name].. 1 by mouth qpm 12)  Concerta 36 Mg Cr-tabs (Methylphenidate hcl) .[name].. 1 tab daily fill on or after 12/20/2009

## 2010-07-24 NOTE — Progress Notes (Signed)
  Phone Note Outgoing Call   Reason for Call: Discuss lab or test results Summary of Call: Please!  Call patient - lyme titres are negative - no evidence of Lyme's disease. Vit D level is normal at 37.  Thanks Initial call taken by: Neena Rhymes MD,  January 28, 2010 2:41 PM  Follow-up for Phone Call        lmoam for pt to call back Follow-up by: Ami Bullins CMA,  January 29, 2010 10:15 AM  Additional Follow-up for Phone Call Additional follow up Details #1::        lmoam for pt to call back Additional Follow-up by: Ami Bullins CMA,  January 30, 2010 9:52 AM    Additional Follow-up for Phone Call Additional follow up Details #2::    Left message on machine to call back to office. Ernestene Mention CMA  February 01, 2010 9:41 AM   left mess to call office back.....................Marland KitchenCharlsie Quest, CMA  February 03, 2010 9:27 AM   Additional Follow-up for Phone Call Additional follow up Details #3:: Details for Additional Follow-up Action Taken: after several attempt could not reach pt. I mailed pt a letter with her lab results. Additional Follow-up by: Ami Bullins CMA,  February 06, 2010 2:22 PM

## 2010-07-24 NOTE — Progress Notes (Signed)
Summary: u/a?  Phone Note Call from Patient Call back at [phone]   Reason for Call: Insurance Question Summary of Call: Pt c/o urinary burning. Ok for u/a only?  Initial call taken by: Lamar Sprinkles, CMA,  April 06, 2010 10:41 AM  Follow-up for Phone Call        OK u/a per MD, Pt informed, HOLD PHONE NOTE OPEN TO WAIT ON RESULTS. Follow-up by: Lamar Sprinkles, CMA,  April 06, 2010 10:56 AM  Additional Follow-up for Phone Call Additional follow up Details #1::        U/A positive  Plan septra DS generic two times a day x 5 Additional Follow-up by: Jacques Navy MD,  April 06, 2010 1:22 PM    Additional Follow-up for Phone Call Additional follow up Details #2::    Left detailed vm on pt's cell Follow-up by: Lamar Sprinkles, CMA,  April 06, 2010 2:43 PM  New/Updated Medications: SEPTRA DS 800-160 MG TABS (SULFAMETHOXAZOLE-TRIMETHOPRIM) 1 two times a day x 5 days Prescriptions: SEPTRA DS 800-160 MG TABS (SULFAMETHOXAZOLE-TRIMETHOPRIM) 1 two times a day x 5 days  #10 x 0   Entered by:   Lamar Sprinkles, CMA   Authorized by:   Jacques Navy MD   Signed by:   Lamar Sprinkles, CMA on 04/06/2010   Method used:   Electronically to        [facility] (retail)       [address]       [place], [place]  [id number]       Ph: [phone] or [phone]       Fax: [phone]   RxID:   [id number]

## 2010-07-24 NOTE — Progress Notes (Signed)
Summary: Concerta  Phone Note Call from Patient   Summary of Call: Patient left message on triage that she is in [place] and is out of Concerta. Patient needs script sent to [address], [place], [place]. Please advise. Initial call taken by: Ernestene Mention,  September 13, 2009 3:10 PM  Follow-up for Phone Call        OK to call in a refill for her concerta - see medlist, one month supply Follow-up by: Neena Rhymes MD,  September 13, 2009 6:04 PM  Additional Follow-up for Phone Call Additional follow up Details #1::        Mailed Additional Follow-up by: Charlsie Quest, CMA,  September 19, 2009 9:07 AM    New/Updated Medications: CONCERTA 36 MG  TBCR (METHYLPHENIDATE HCL) once daily fill on or after 09/13/2009 Prescriptions: CONCERTA 36 MG  TBCR (METHYLPHENIDATE HCL) once daily fill on or after 09/13/2009  #30 x 0   Entered by:   Ernestene Mention   Authorized by:   Neena Rhymes MD   Signed by:   Ernestene Mention on 09/14/2009   Method used:   Print then Give to Patient   RxID:   [id number]

## 2010-07-24 NOTE — Letter (Signed)
   [facility] [address] [place], [place]  [address] Phone: [phone]      December 24, 2009   [facility] [address] [place],  [address]  RE:  LAB RESULTS  Dear  Ms. [facility],  The following is an interpretation of your most recent lab tests.  Please take note of any instructions provided or changes to medications that have resulted from your lab work.   THYROID STUDIES:  Thyroid studies normal TSH: 1.72    B12 normal @ 383; sed rate normal @ 34m/hr; CK with trivial elevation @ 182 (7-177); ANA negative - no indication of connective tissue disease, e.g. lupus; Rhematoid Factor negative @ <20.   All the lab work is negative. If you have p;rogressive symptoms or more focal symptoms please return to see me.   Sincerely Yours,    MNeena RhymesMD

## 2010-07-24 NOTE — Assessment & Plan Note (Signed)
Summary: TIRED/ LEG ACHES/NWS   Vital Signs:  Patient profile:   65 year old female Height:      69 inches Weight:      173 pounds BMI:     25.64 O2 Sat:      96 % on Room air Temp:     98.3 degrees F oral Pulse rate:   84 / minute BP sitting:   148 / 80  (left arm) Cuff size:   regular  Vitals Entered By: Charlynne Cousins CMA (December 21, 2009 3:47 PM)  O2 Flow:  Room air CC: pt here with c/o weakness and fatigue/ ab Comments 3 original rx's for concerta were printed but signed wrong - must be BMN/DAW. Pt also has rx at [facility] to fill 6/29 - it is not for brand name. Called pharmacy and canclled rx for June. Rx's were reprinted and MD signed on DAW line. Original 3 rx's were shredded. ...........Marland KitchenCharlsie Quest, CMA  December 21, 2009 4:52 PM    Primary Care Provider:  Teigan Sahli  CC:  pt here with c/o weakness and fatigue/ ab.  History of Present Illness: c/o leg weakness and inability to be as active as usual. The discomfort originates distally and radiates cephalad. She has chronic back discomfort but there is some increase. She denies dense paresthesia or loss of sensation but does describe aching and sense of poor circulation.   Current Medications (verified): 1)  Synthroid 100 Mcg  Tabs (Levothyroxine Sodium) .... Once Daily 2)  Ketoprofen Cr 200 Mg  Cp24 (Ketoprofen) .... Once Daily 3)  Prozac 20 Mg  Caps (Fluoxetine Hcl) .... By Mouth Once Daily 4)  Wellbutrin Xl 300 Mg  Tb24 (Bupropion Hcl) .... Once Daily 5)  Pantoprazole Sodium 40 Mg Tbec (Pantoprazole Sodium) .[name].. 1 Two Times A Day 6)  Neurontin 300 Mg  Caps (Gabapentin) .... Three Times A Day 7)  Maxair Autohaler 200 Mcg/inh  Aerb (Pirbuterol Acetate) .[name].. 1 or 2 Puffs As Needed 8)  Ambien 10 Mg  Tabs (Zolpidem Tartrate) .... At Bedtime As Needed 9)  Losartan Potassium-Hctz 50-12.5 Mg Tabs (Losartan Potassium-Hctz) .[name].. 1 By Mouth Once Daily 10)  Fluticasone Propionate 50 Mcg/act  Susp (Fluticasone Propionate) .[name].. 1  Spray/nares Daily 11)  Simvastatin 20 Mg Tabs (Simvastatin) .[name].. 1 By Mouth Qpm 12)  Concerta 36 Mg Cr-Tabs (Methylphenidate Hcl) .[name].. 1 Tab Daily Fill On or After 12/20/2009 13)  Alprazolam 0.5 Mg Tabs (Alprazolam) .... 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 Judeen Hammans [facility] reviewed for relevance, FH reviewed for relevance  Review of Systems  The patient denies anorexia, weight loss, chest pain, dyspnea on exertion, peripheral edema, prolonged cough, abdominal pain, severe indigestion/heartburn, muscle weakness, difficulty walking, and enlarged lymph nodes.    Physical Exam  General:  WNWD mildly overweight white female in no distress Head:  normocephalic and atraumatic.   Eyes:  pupils equal, pupils round, and corneas and lenses clear.   Lungs:  Normal respiratory effort, chest expands symmetrically. Lungs are clear to auscultation, no crackles or wheezes. Heart:  Normal rate and regular rhythm. S1 and S2 normal without gallop, murmur, click, rub or other extra sounds. Abdomen:  soft and normal bowel sounds.   Msk:  no joint tenderness, no joint swelling, no joint warmth, and no joint deformities.  ACE wrap on left knee Pulses:  2+  radial Extremities:  No clubbing, cyanosis, edema, or deformity noted with normal full range of motion of all joints.   Neurologic:  alert & oriented X3, cranial nerves II-XII intact, gait normal, and DTRs symmetrical and normal.   Skin:  turgor normal, color normal, and no suspicious lesions.   Psych:  Oriented X3, normally interactive, and good eye contact.     Patient: [name] Note: All result statuses are Final unless otherwise noted.  Tests: (1) B12 + Folate Panel (B12/FOL)   Vitamin B12               383 pg/mL                   211-911   Folate                    13.1 ng/mL     Deficient  0.4 - 3.4 ng/mL     Indeterminate  3.4 - 5.4 ng/mL     Normal  >5.4 ng/mL  Tests: (2) TSH (TSH)   FastTSH                   1.72 uIU/mL                 0.35-5.50  Tests: (3) Sed Rate (ESR)   Sed Rate                  5 mm/hr                     0-22  Tests: (4) Creatine Kinase (CK)   Creatine Kinase      [H]  182 U/L                     7-177Tests: (1) Anti Nuclear Antibody (ANA) Reflex (23900)  Anti Nuclear Antibody (ANA)                             NEG                         NEGATIVE Tests: (1) Rheumatoid (RA) Factor (49702)  Rheumatoid (RA) Factor                             < 20 IU/mL                  0-20  Impression & Recommendations:  Problem # 1:  PERIPHERAL NEUROPATHY (ICD-356.9) Normal exam eith minimal decreased deep vibratory sensation, i.e. she could not tell when vibration stopped. Lab results are all normal except for trivial elevation in CK.  Plan - no further eval at this time. Watch for progressive symptoms.  Orders: TLB-B12 + Folate Pnl (63785_88502-D74/JOI) TLB-TSH (Thyroid Stimulating Hormone) (84443-TSH) TLB-Sedimentation Rate (ESR) (85652-ESR) T-Antinuclear Antib (ANA) (78676-72094) TLB-CK Total Only(Creatine Kinase/CPK) (82550-CK) T- * Misc. 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  Plan - rewrote Rx for brand-name only  Complete Medication List: 1)  Concerta 36 Mg Cr-tabs (Methylphenidate hcl) .[name].. 1 tab daily fill on or after 03/22/2010 2)  Synthroid 100 Mcg Tabs (Levothyroxine sodium) .... Once daily 3)  Ketoprofen Cr 200 Mg Cp24 (Ketoprofen) .... Once daily 4)  Prozac 20 Mg Caps (Fluoxetine hcl) .... By mouth once daily 5)  Wellbutrin Xl 300 Mg Tb24 (Bupropion hcl) .... Once daily 6)  Pantoprazole Sodium 40 Mg Tbec (Pantoprazole sodium) .[name].. 1 two times a day 7)  Neurontin 300 Mg Caps (Gabapentin) .... Three times a day 8)  Maxair Autohaler 200 Mcg/inh Aerb (Pirbuterol acetate) .[name].. 1 or 2 puffs as needed 9)  Ambien 10 Mg Tabs (Zolpidem tartrate) .... At bedtime as needed 10)  Losartan Potassium-hctz 50-12.5 Mg Tabs (Losartan potassium-hctz) .[name].. 1 by mouth once daily 11)  Fluticasone Propionate 50 Mcg/act Susp (Fluticasone propionate) .[name].. 1 spray/nares daily 12)  Simvastatin 20 Mg Tabs (Simvastatin) .[name].. 1 by mouth qpm 13)  Alprazolam 0.5 Mg Tabs (Alprazolam) .... 0.5-1 by mouth three times a day prn Prescriptions: CONCERTA 36 MG CR-TABS (METHYLPHENIDATE HCL) 1 tab daily fill on or after 03/22/2010 Brand medically necessary #30 x 0   Entered by:   Charlsie Quest, CMA   Authorized by:   Neena Rhymes MD   Signed by:   Charlsie Quest, CMA on 12/21/2009   Method used:   Print then Give to Patient   RxID:   [id number] CONCERTA 36 MG CR-TABS (METHYLPHENIDATE HCL) 1 tab daily fill on or after 02/19/2010 Brand medically necessary #30 x 0   Entered by:   Charlsie Quest, CMA   Authorized by:   Neena Rhymes MD   Signed by:   Charlsie Quest, CMA on 12/21/2009   Method used:   Print then Give to Patient   RxID:   [id number] CONCERTA 36 MG CR-TABS (METHYLPHENIDATE HCL) 1 tab daily fill on or after 01/19/2010 Brand medically necessary #30 x 0   Entered by:   Charlsie Quest, CMA   Authorized by:   Neena Rhymes MD   Signed by:   Charlsie Quest, CMA on 12/21/2009   Method used:   Print then Give to Patient   RxID:   [id number] CONCERTA 36 MG CR-TABS (METHYLPHENIDATE HCL) 1 tab daily fill on or after 12/20/2009 Brand medically necessary #30 x 0   Entered by:   Charlsie Quest, CMA   Authorized by:   Neena Rhymes MD   Signed by:   Charlsie Quest, CMA on 12/21/2009   Method used:   Print then Give to Patient   RxID:   [id number] CONCERTA 36 MG CR-TABS (METHYLPHENIDATE HCL) 1 tab daily fill on or after 03/22/2010 Brand medically necessary #30 x 0   Entered by:   Ami Bullins CMA   Authorized by:   Neena Rhymes MD   Signed by:   Charlynne Cousins CMA on 12/21/2009   Method used:   Print then Give to Patient   RxID:   [id number] CONCERTA 36 MG CR-TABS (METHYLPHENIDATE HCL) 1 tab daily fill on or after 02/19/2010 Brand medically necessary #30 x 0   Entered by:   Ami Bullins CMA   Authorized by:   Neena Rhymes MD   Signed by:   Charlynne Cousins CMA on 12/21/2009   Method used:   Print then Give to Patient   RxID:   [id number] CONCERTA 35 MG  CR-TABS (METHYLPHENIDATE HCL) 1 tab daily fill on or after 01/19/2010 Brand medically necessary #30 x 0   Entered by:   Ami Bullins CMA   Authorized by:   Neena Rhymes MD   Signed by:   Charlynne Cousins CMA on 12/21/2009   Method used:   Print then Give to Patient   RxID:   [id number]

## 2010-07-24 NOTE — Progress Notes (Signed)
Summary: REFILLS  Phone Note Refill Request Call back at [phone]   Refills Requested: Medication #1:  CONCERTA 36 MG CR-TABS 1 tab daily fill on or after 03/22/2010 [BMN] Ok for 3 mths?   Initial call taken by: Charlsie Quest, [place],  April 18, 2010 10:34 AM  Follow-up for Phone Call        ok for 30 day supply x 3 Follow-up by: Neena Rhymes MD,  April 18, 2010 2:01 PM  Additional Follow-up for Phone Call Additional follow up Details #1::        called pt and spoke with her husband Dominica Severin, let him know to inform pt prescriptions are ready to be picked up. They were put up front in cabinet Additional Follow-up by: Ami Bullins CMA,  April 19, 2010 10:56 AM    New/Updated Medications: CONCERTA 36 MG CR-TABS (METHYLPHENIDATE HCL) 1 tab daily fill on or after 48/27/0786 [BMN] CONCERTA 36 MG CR-TABS (METHYLPHENIDATE HCL) 1 tab daily fill on or after 75/44/9201 [BMN] CONCERTA 36 MG CR-TABS (METHYLPHENIDATE HCL) 1 tab daily fill on or after 06/21/2010 [BMN] Prescriptions: CONCERTA 36 MG CR-TABS (METHYLPHENIDATE HCL) 1 tab daily fill on or after 06/21/2010 Brand medically necessary #30 x 0   Entered by:   Charlsie Quest, CMA   Authorized by:   Neena Rhymes MD   Signed by:   Charlsie Quest, CMA on 04/18/2010   Method used:   Print then Give to Patient   RxID:   [id number] CONCERTA 36 MG CR-TABS (METHYLPHENIDATE HCL) 1 tab daily fill on or after 05/22/2010 Brand medically necessary #30 x 0   Entered by:   Charlsie Quest, CMA   Authorized by:   Neena Rhymes MD   Signed by:   Charlsie Quest, CMA on 04/18/2010   Method used:   Print then Give to Patient   RxID:   [id number] CONCERTA 36 MG CR-TABS (METHYLPHENIDATE HCL) 1 tab daily fill on or after 04/21/2010 Brand medically necessary #30 x 0   Entered by:   Charlsie Quest, CMA   Authorized by:   Neena Rhymes MD   Signed by:   Charlsie Quest, CMA on 04/18/2010   Method used:   Print then Give to Patient   RxID:    [id number]

## 2010-07-24 NOTE — Progress Notes (Signed)
  Phone Note Refill Request Message from:  Fax from Pharmacy on September 01, 2009 8:20 AM  Refills Requested: Medication #1:  WELLBUTRIN XL 300 MG  TB24 once daily Initial call taken by: Ami Bullins CMA,  September 01, 2009 8:20 AM    Prescriptions: WELLBUTRIN XL 300 MG  TB24 (BUPROPION HCL) once daily  #30 x 6   Entered by:   Ami Bullins CMA   Authorized by:   Neena Rhymes MD   Signed by:   Charlynne Cousins CMA on 09/01/2009   Method used:   Telephoned to ...       [facility] (retail)       [place]  [address]       Ph: [phone]       Fax: [phone]   RxID:   [id number]

## 2010-07-24 NOTE — Progress Notes (Signed)
Summary: REFERRAL   Phone Note Call from Patient Call back at Home Phone [phone] Call back at [facility] VM ON HM #   Summary of Call: Patient is requesting referral for rheumatologist.  Initial call taken by: Lamar Sprinkles, CMA,  February 22, 2010 11:39 AM  Follow-up for Phone Call        ok. [facility] notified. Will refer to Azzie Roup Follow-up by: Jacques Navy MD,  February 22, 2010 5:56 PM  Additional Follow-up for Phone Call Additional follow up Details #1::        Pt informed  Additional Follow-up by: Lamar Sprinkles, CMA,  February 22, 2010 6:18 PM

## 2010-07-24 NOTE — Letter (Signed)
Summary: Colonoscopy Letter  [facility]  [address] [place], [place] [address]   Phone: [phone]  Fax: [phone]      January 24, 2010 MRN: [id number]   [facility] [address] [place], [place]  [address]   Dear Ms. [facility],   According to your medical record, it is time for you to schedule a Colonoscopy. The American Cancer Society recommends this procedure as a method to detect early colon cancer. Patients with a family history of colon cancer, or a personal history of colon polyps or inflammatory bowel disease are at increased risk.  This letter has been generated based on the recommendations made at the time of your procedure. If you feel that in your particular situation this may no longer apply, please contact our office.  Please call our office at [phone] to schedule this appointment or to update your records at your earliest convenience.  Thank you for cooperating with [place] to provide you with the very best care possible.   Sincerely,   Barbette Hair. Arlyce Dice, M.D.  [facility]astroenterology [facility] [phone]

## 2010-07-24 NOTE — Letter (Signed)
Summary: Generic Letter  [facility]   [place], [place] [address]   Phone: [phone]  Fax: [phone]    02/06/2010  [facility] [address] [place], [place]  [address]  Dear Ms. [facility],     This letter is to inform you that your - lyme titres are negative - no evidence of Lyme's disease. Vit D level is normal at 37. If you have any questions feel free to give our office a call [phone].        Sincerely,   Ami Bullins CMA

## 2010-07-24 NOTE — Progress Notes (Signed)
Summary: CALL  Phone Note Call from Patient Call back at [phone]   Summary of Call: Patient is requesting a call. C/o continued pain. Her psychologist has told pt that there is a "long battery of tests" that can be done to figure out the problem. Initial call taken by: Charlsie Quest, [place],  January 22, 2010 10:05 AM  Follow-up for Phone Call        Spoke w/pt. Advised patient that she had many tests at last office visit and if symptoms continued to make f/u office visit. Her psycologist had told her that someone they know had similar symptoms and had low Vit D levels. She would like labs to check Vit D and anything else that may be the cause. Please advise. Follow-up by: Charlsie Quest, [place],  January 22, 2010 1:56 PM  Additional Follow-up for Phone Call Additional follow up Details #1::        Advise the patient that 1) we are happy to order the test 780.79, 2) no reports of Vit D being symptomatic, i.e. causing pain.  Additional Follow-up by: Neena Rhymes MD,  January 22, 2010 2:35 PM    Additional Follow-up for Phone Call Additional follow up Details #2::    Pt informed. She had a 2 tick bites in the last couple months and would like testing for lymes disease. Please advise...........Marland KitchenCharlsie Quest, CMA  January 23, 2010 3:40 PM   OK - 780.79.   although in the absence of having had a fed tick that she removed, in the  absence of erythema migrans rash and if the tick bites were in [facility] it is very unlikely that she would have late manifestations of lyme's disease Follow-up by: Neena Rhymes MD,  January 23, 2010 4:17 PM  Additional Follow-up for Phone Call Additional follow up Details #3:: Details for Additional Follow-up Action Taken: Pt informed, order in idx Additional Follow-up by: Charlsie Quest, CMA,  January 23, 2010 5:12 PM

## 2010-07-24 NOTE — Progress Notes (Signed)
Summary: ABD PAIN   Phone Note Call from Patient Call back at [facility] Phone [phone]   Summary of Call: Pt c/o abd cramps off and on x approx 1 year. Discomfort is in the center of abd just below navel. She describes it as a painful grabbing sensation, "like someone is squeezing her ovaries". Per pt it is not associated with any bowel change. She has hyoscymine but does not see any relief, even when taking 2 at a time. Does she need eval with PCP? GI referral? GYN referral? Please advise.  Initial call taken by: Lamar Sprinkles, CMA,  February 28, 2010 11:30 AM  Follow-up for Phone Call        always good to start wiht PCP Follow-up by: Jacques Navy MD,  February 28, 2010 3:34 PM  Additional Follow-up for Phone Call Additional follow up Details #1::        Pt informed, scheduled for tomorrow Additional Follow-up by: Lamar Sprinkles, CMA,  February 28, 2010 4:48 PM    New/Updated Medications: HYOSCYAMINE SULFATE 0.125 MG TABS (HYOSCYAMINE SULFATE) 1 every 2 to 4 hours as needed

## 2010-07-24 NOTE — Progress Notes (Signed)
Phone Note Refill Request Message from:  Fax from Pharmacy on April 02, 2010 11:21 AM  Refills Requested: Medication #1:  NEURONTIN 300 MG  CAPS three times a day  Medication #2:  SIMVASTATIN 20 MG TABS 1 by mouth qPM  Medication #3:  LOSARTAN POTASSIUM-HCTZ 50-12.5 MG TABS 1 by mouth once daily  Medication #4:  KETOPROFEN CR 200 MG  CP24 once daily Initial call taken by: Ami Bullins CMA,  April 02, 2010 11:22 AM    Prescriptions: SIMVASTATIN 20 MG TABS (SIMVASTATIN) 1 by mouth qPM  #30 Tablet x 12   Entered by:   Ami Bullins CMA   Authorized by:   Neena Rhymes MD   Signed by:   Charlynne Cousins CMA on 04/02/2010   Method used:   Faxed to ...       [facility] (retail)       [address] [place], [place]  [id number]       Ph: [phone] or [phone]       Fax: [phone]   RxID:   [id number] LOSARTAN POTASSIUM-HCTZ 50-12.5 MG TABS (LOSARTAN POTASSIUM-HCTZ) 1 by mouth once daily  #30 Tablet x 12   Entered by:   Ami Bullins CMA   Authorized by:   Neena Rhymes MD   Signed by:   Charlynne Cousins CMA on 04/02/2010   Method used:   Faxed to ...       [facility] (retail)       [address] [place], [place]  [id number]       Ph: [phone] or [phone]       Fax: [phone]   RxID:   [id number] NEURONTIN 300 MG  CAPS (GABAPENTIN) three times a day  #90 x 1   Entered by:   Charlynne Cousins CMA   Authorized by:   Neena Rhymes MD   Signed by:   Charlynne Cousins CMA on 04/02/2010   Method used:   Faxed to ...       [facility] (retail)       [address] [place], [place]  [id number]       Ph: [phone] or [phone]       Fax: [phone]   RxID:   [id number] KETOPROFEN CR 200 MG  CP24 (KETOPROFEN) once daily  #30 x 12   Entered by:   Ami Bullins CMA   Authorized by:   Neena Rhymes MD   Signed by:   Charlynne Cousins CMA on 04/02/2010   Method used:   Faxed to ...       [facility]  (retail)       [address] [place], [place]  [id number]       Ph: [phone] or [phone]       Fax: [phone]   RxID:   [id number] SYNTHROID 100 MCG  TABS (LEVOTHYROXINE SODIUM) once daily  #30 Tablet x 12   Entered by:   Ami Bullins CMA   Authorized by:   Neena Rhymes MD   Signed by:   Charlynne Cousins CMA on 04/02/2010   Method used:   Faxed to ...       [facility] (retail)       [address]       [place], [place]  [id number]  Ph: [phone] or [phone]       Fax: [phone]   RxID:   [id number]

## 2010-07-24 NOTE — Progress Notes (Signed)
Summary: Refill--Alprazolam  Phone Note Refill Request Message from:  Fax from Pharmacy on November 29, 2009 2:20 PM  Refills Requested: Medication #1:  Alprazolam 0.30m   Notes: 0.5-1 tid prn  Medication #2:  PROZAC 20 MG  CAPS by mouth once daily   Notes: [facility]  Medication #3:  AMBIEN 10 MG  TABS at bedtime as needed   Notes: [facility] Next Appointment Scheduled: none Initial call taken by: KErnestene Mention  November 29, 2009 2:20 PM  Follow-up for Phone Call        OK for refills: xanax x 5, others as needed. Follow-up by: MNeena RhymesMD,  November 29, 2009 5:24 PM    New/Updated Medications: ALPRAZOLAM 0.5 MG TABS (ALPRAZOLAM) 0.5-1 by mouth three times a day prn Prescriptions: ALPRAZOLAM 0.5 MG TABS (ALPRAZOLAM) 0.5-1 by mouth three times a day prn  #90 x 5   Entered by:   KErnestene Mention  Authorized by:   MNeena RhymesMD   Signed by:   KErnestene Mentionon 11/30/2009   Method used:   Telephoned to ...       [facility] (retail)       [address] [place] [place] [id number]      Ph: [phone] [phone]      Fax: [phone]  RxID:   [id number] 10 MG  TABS (ZOLPIDEM TARTRATE) at bedtime as needed  #30 x 11   Entered by:   KErnestene Mention  Authorized by:   MNeena RhymesMD   Signed by:   KErnestene Mentionon 11/30/2009   Method used:   Telephoned to ...       [facility] (retail)       [address] [place] [place] [id number]      Ph: [phone] [phone]      Fax: [phone]  RxID:   [id number] 20 MG  CAPS (FLUOXETINE HCL) by mouth once daily  #90 x 3   Entered by:   KErnestene Mention  Authorized by:   MNeena RhymesMD   Signed by:   KErnestene Mentionon 11/30/2009   Method used:   Telephoned to ...       [facility] (retail)       [address] [place] [place] [id number]      Ph: [phone] [phone]      Fax: [phone]  RxID:    [id number]    Current Medications (verified): 1)  Synthroid 100 Mcg  Tabs (Levothyroxine Sodium) .... Once Daily 2)  Ketoprofen Cr 200 Mg  Cp24 (Ketoprofen) .... Once Daily 3)  Prozac 20 Mg  Caps (Fluoxetine Hcl) .... By Mouth Once Daily 4)  Wellbutrin Xl 300 Mg  Tb24 (Bupropion Hcl) .... Once Daily 5)  Pantoprazole Sodium 40 Mg Tbec (Pantoprazole Sodium) ..[name]. 1 Two Times A Day 6)  Neurontin 300 Mg  Caps (Gabapentin) .... Three Times A Day 7)  Maxair Autohaler 200 Mcg/inh  Aerb (Pirbuterol Acetate) ..[name]. 1 or 2 Puffs As Needed 8)  Ambien 10 Mg  Tabs (Zolpidem Tartrate) .... At Bedtime As Needed 9)  Losartan Potassium-Hctz 50-12.5 Mg Tabs (Losartan Potassium-Hctz) ..[name]. 1 By Mouth Once Daily 10)  Fluticasone Propionate 50 Mcg/act  Susp (  Fluticasone Propionate) .[name].. 1 Spray/nares Daily 11)  Simvastatin 20 Mg Tabs (Simvastatin) .[name].. 1 By Mouth Qpm 12)  Concerta 36 Mg Cr-Tabs (Methylphenidate Hcl) .[name].. 1 Tab Daily Fill On or After 12/20/2009 13)  Alprazolam 0.5 Mg Tabs (Alprazolam) .... 0.5-1 By Mouth Three Times A Day Prn  Allergies: No Known Drug Allergies

## 2010-07-26 NOTE — Progress Notes (Signed)
Summary: REGLAN?  Phone Note Call from Patient Call back at [facility] Phone [phone]   Summary of Call: Patient is requesting to try reglan for nausea b/c phenergan makes her too sleepy.  Initial call taken by: Charlsie Quest, [place],  July 05, 2010 3:13 PM  Follow-up for Phone Call        ok to try reglan 5 mg q 6 # 20 with 1 refill. Can also take promethazine if needed.  Follow-up by: Neena Rhymes MD,  July 05, 2010 3:35 PM  Additional Follow-up for Phone Call Additional follow up Details #1::        Pt informed  Additional Follow-up by: Charlsie Quest, CMA,  July 05, 2010 4:08 PM    New/Updated Medications: REGLAN 5 MG TABS (METOCLOPRAMIDE HCL) 1 every 6 hours as needed nausea Prescriptions: REGLAN 5 MG TABS (METOCLOPRAMIDE HCL) 1 every 6 hours as needed nausea  #20 x 1   Entered by:   Charlsie Quest, CMA   Authorized by:   Neena Rhymes MD   Signed by:   Charlsie Quest, CMA on 07/05/2010   Method used:   Electronically to        [facility] (retail)       [address] [place], [place]  [id number]       Ph: [phone] or [phone]       Fax: [phone]   RxID:   [id number]

## 2010-07-26 NOTE — Progress Notes (Signed)
Summary: RESULTS  Phone Note Outgoing Call   Reason for Call: Discuss lab or test results Summary of Call: please call patient - labs are normal: normal sodium and potassium. Continue supportive care. Call me for problems.  Thanks. Initial call taken by: Neena Rhymes MD,  July 06, 2010 6:03 AM  Follow-up for Phone Call        Pt informed. She forgot to talk to Dr about swalloing problem. She says it is difficult to swallow and the foods and/or liquids get "stuck" about midway down.  She is not choking or coughing on liquids or solids, just gets "stuck"  Follow-up by: Charlsie Quest, [place],  July 06, 2010 11:01 AM  Additional Follow-up for Phone Call Additional follow up Details #1::        when she is ready I recommend GI evaluation for possible esophageal stricture Additional Follow-up by: Neena Rhymes MD,  July 06, 2010 12:51 PM    Additional Follow-up for Phone Call Additional follow up Details #2::    left mess on hm #  Follow-up by: Charlsie Quest, [place],  July 06, 2010 5:19 PM

## 2010-07-26 NOTE — Progress Notes (Signed)
  Phone Note Refill Request Message from:  Fax from Pharmacy on June 06, 2010 2:04 PM  Refills Requested: Medication #1:  NEURONTIN 300 MG  CAPS three times a day fax from [facility], please advise  Initial call taken by: Ami Bullins CMA,  June 06, 2010 2:05 PM  Follow-up for Phone Call        ok for refill as needed  Follow-up by: Neena Rhymes MD,  June 06, 2010 4:59 PM    Prescriptions: NEURONTIN 300 MG  CAPS (GABAPENTIN) three times a day  #90 x 1   Entered by:   Ami Bullins CMA   Authorized by:   Neena Rhymes MD   Signed by:   Charlynne Cousins CMA on 06/07/2010   Method used:   Electronically to        [facility] (retail)       [address] [place], [place]  [id number]       Ph: [phone] or [phone]       Fax: [phone]   RxID:   [id number]

## 2010-07-26 NOTE — Progress Notes (Signed)
Summary: not feeling well  Phone Note Call from Patient Call back at Home Phone [phone]   Caller: Patient Summary of Call: Patient called lmovm c/o cold symptoms, diarrhea, vomiting x 10 days and lack of appetite x 3 days. She states that she has been in bed and wants advisement on if she needs to be seen. Thanks.Marland KitchenMarland KitchenEllison Hughs Archie CMA  July 05, 2010 9:25 AM   Follow-up for Phone Call        Scheduled for office visit this pm Follow-up by: Charlsie Quest, CMA,  July 05, 2010 12:37 PM

## 2010-07-26 NOTE — Progress Notes (Signed)
Summary: CONCERTA RFs  Phone Note Refill Request Message from:  Fax from Pharmacy on June 27, 2010 2:46 PM  Refills Requested: Medication #1:  CONCERTA 36 MG CR-TABS 1 tab daily fill on or after 06/21/2010 [BMN] Please Advise refill  Initial call taken by: Alexander,  June 27, 2010 2:46 PM  Follow-up for Phone Call        ok for refill with 3 scripts Follow-up by: Neena Rhymes MD,  June 28, 2010 12:56 PM  Additional Follow-up for Phone Call Additional follow up Details #1::        rxs printed and signed pt informed put up front for pick up Additional Follow-up by: Ami Bullins CMA,  June 28, 2010 3:51 PM    New/Updated Medications: CONCERTA 36 MG CR-TABS (METHYLPHENIDATE HCL) 1 tab daily fill on or after 40/76/8088 [BMN] CONCERTA 36 MG CR-TABS (METHYLPHENIDATE HCL) 1 tab daily fill on or after 04/26/1593 [BMN] CONCERTA 36 MG CR-TABS (METHYLPHENIDATE HCL) 1 tab daily fill on or after 09/20/2010 [BMN] Prescriptions: CONCERTA 36 MG CR-TABS (METHYLPHENIDATE HCL) 1 tab daily fill on or after 09/20/2010 Brand medically necessary #30 x 0   Entered by:   Ami Bullins CMA   Authorized by:   Neena Rhymes MD   Signed by:   Charlynne Cousins CMA on 06/28/2010   Method used:   Print then Give to Patient   RxID:   [id number] CONCERTA 36 MG CR-TABS (METHYLPHENIDATE HCL) 1 tab daily fill on or after 08/22/2010 Brand medically necessary #30 x 0   Entered by:   Ami Bullins CMA   Authorized by:   Neena Rhymes MD   Signed by:   Charlynne Cousins CMA on 06/28/2010   Method used:   Print then Give to Patient   RxID:   [id number] CONCERTA 36 MG CR-TABS (METHYLPHENIDATE HCL) 1 tab daily fill on or after 07/22/2010 Brand medically necessary #30 x 0   Entered by:   Ami Bullins CMA   Authorized by:   Neena Rhymes MD   Signed by:   Charlynne Cousins CMA on 06/28/2010   Method used:   Print then Give to Patient   RxID:   [id number]

## 2010-07-26 NOTE — Progress Notes (Signed)
Summary: GI MD?   Phone Note Call from Patient Call back at [phone]   Summary of Call: Pt wants to know name of who MD reccomends for GI MD.  Initial call taken by: Charlsie Quest, CMA,  June 19, 2010 11:14 AM  Follow-up for Phone Call        Owens Loffler Follow-up by: Neena Rhymes MD,  June 19, 2010 12:46 PM  Additional Follow-up for Phone Call Additional follow up Details #1::        Pt c/o 5 wks of loose stools, she is usually constipated. Feels this is stress related. Stools are almost after every meal. Should she see GI? Or office visit w/you?   Pt has been taking metimucil as directed by dr Linda Hedges for only about 3 days w/little change. She has IBS, would GI be waste of time?  Additional Follow-up by: Charlsie Quest, [place],  June 19, 2010 1:35 PM    Additional Follow-up for Phone Call Additional follow up Details #2::    as always it is better to start with a visit with the PCP Follow-up by: Neena Rhymes MD,  June 19, 2010 5:54 PM  Additional Follow-up for Phone Call Additional follow up Details #3:: Details for Additional Follow-up Action Taken: Informed pt, she states since she has been taking the metamucil her symptoms are much better. She is going to stick with this and will call later on if she feels she needs an appt Additional Follow-up by: Ami Bullins CMA,  June 20, 2010 1:22 PM

## 2010-07-26 NOTE — Assessment & Plan Note (Signed)
Summary: cold symptoms/diarrhea,vomitingx10 days loa/lb   Vital Signs:  Patient profile:   65 year old female Height:      69 inches Weight:      162 pounds BMI:     24.01 Temp:     97.8 degrees F oral Pulse rate:   88 / minute Pulse rhythm:   regular Resp:     16 per minute BP sitting:   130 / 90  (left arm) Cuff size:   regular  Vitals Entered By: Jonathon Resides, CMA(AAMA) (July 05, 2010 2:08 PM) CC: Nausea, vomitting and diarrhea X 4 days Is Patient Diabetic? No Comments pt is not using Fluticasone   Primary Care Provider:  Aniela Caniglia  CC:  Nausea and vomitting and diarrhea X 4 days.  History of Present Illness: Patient presents for a long illness, maybe a month, with worsening symptoms with nausea and voimting and diarrhea since monday night. She has not been eating or taking in much fluid. She is weak and shaky, confusion and lack of focus, difficulty with concentration when driving and at other times. No fevers, shaking chills, no hematochezia, melena, no hemetemesis. Mild shortness of breath but not worse then usual. She has been coughing productive of a scant amount of green sputum. No focal pain other than sore muscles in the leg from a fall.   Current Medications (verified): 1)  Concerta 36 Mg Cr-Tabs (Methylphenidate Hcl) .[name].. 1 Tab Daily Fill On or After 09/20/2010 2)  Synthroid 100 Mcg  Tabs (Levothyroxine Sodium) .... Once Daily 3)  Ketoprofen Cr 200 Mg  Cp24 (Ketoprofen) .... Once Daily 4)  Prozac 20 Mg  Caps (Fluoxetine Hcl) .... By Mouth Once Daily 5)  Wellbutrin Xl 300 Mg  Tb24 (Bupropion Hcl) .... Once Daily 6)  Pantoprazole Sodium 40 Mg Tbec (Pantoprazole Sodium) .[name].. 1 Two Times A Day 7)  Neurontin 300 Mg  Caps (Gabapentin) .... Three Times A Day 8)  Maxair Autohaler 200 Mcg/inh  Aerb (Pirbuterol Acetate) .[name].. 1 or 2 Puffs As Needed 9)  Ambien 10 Mg  Tabs (Zolpidem Tartrate) .... At Bedtime As Needed 10)  Losartan Potassium-Hctz 50-12.5 Mg Tabs (Losartan  Potassium-Hctz) .[name].. 1 By Mouth Once Daily 11)  Fluticasone Propionate 50 Mcg/act  Susp (Fluticasone Propionate) .[name].. 1 Spray/nares Daily 12)  Simvastatin 20 Mg Tabs (Simvastatin) .[name].. 1 By Mouth Qpm 13)  Alprazolam 0.5 Mg Tabs (Alprazolam) .... 0.5-1 By Mouth Three Times A Day Prn 14)  Hyoscyamine Sulfate 0.125 Mg Tabs (Hyoscyamine Sulfate) .[name].. 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 Judeen Hammans [place] reviewed for relevance, SH/Risk Factors reviewed for relevance  Physical Exam  General:  ortgho-static supine 138/82  84, sitting 140/84 80, standing 140/84 80. Head:  normocephalic, atraumatic, and no abnormalities observed.   Eyes:  vision grossly intact, pupils equal, and pupils round.   Ears:  R ear normal and L ear normal.   Mouth:  good dentition.   Neck:  supple, full ROM, and  no masses.   Chest Wall:  no deformities.   Lungs:  normal respiratory effort and normal breath sounds.   Heart:  normal rate, regular rhythm, and no JVD.   Abdomen:  soft, non-tender, normal bowel sounds, no masses, no rigidity, and no abdominal hernia.   Msk:  normal ROM, no joint tenderness, no joint warmth, and no joint deformities.   Pulses:  2+ radial Neurologic:   alert & oriented X3, cranial nerves II-XII intact, strength normal in all extremities, sensation intact to light touch, gait normal, and DTRs symmetrical and normal.   Skin:  turgor normal, color normal, no suspicious lesions, and no purpura.   Cervical Nodes:  no anterior cervical adenopathy and no posterior cervical adenopathy.   Psych:  Oriented X3, memory intact for recent and remote, good eye contact, and not anxious appearing.     Impression & Recommendations:  Problem # 1:  GASTROENTERITIS, VIRAL, ACUTE (ICD-008.8) Patient with symptoms c/w viral gastroenteritis. Orthostatic vitals are normal and exam is non-focal.  Plan symptomatic treatment for nausea - reglan and or promethazine                                              for diarreha - lomotil  Orders: TLB-BMP (Basic Metabolic Panel-BMET) (28208-HNGITJL) TLB-CBC Platelet - w/Differential (85025-CBCD) TLB-TSH (Thyroid Stimulating Hormone) (84443-TSH)  Addendum - labs are normal.  Complete Medication List: 1)  Concerta 36 Mg Cr-tabs (Methylphenidate hcl) .[name].. 1 tab daily fill on or after 09/20/2010 2)  Synthroid 100 Mcg Tabs (Levothyroxine sodium) .... Once daily 3)  Ketoprofen Cr 200 Mg Cp24 (Ketoprofen) .... Once daily 4)  Prozac 20 Mg Caps (Fluoxetine hcl) .... By mouth once daily 5)  Wellbutrin Xl 300 Mg Tb24 (Bupropion hcl) .... Once daily 6)  Pantoprazole Sodium 40 Mg Tbec (Pantoprazole sodium) .[name].. 1 two times a day 7)  Neurontin 300 Mg Caps (Gabapentin) .... Three times a day 8)  Maxair Autohaler 200 Mcg/inh Aerb (Pirbuterol acetate) .[name].. 1 or 2 puffs as needed 9)  Ambien 10 Mg Tabs (Zolpidem tartrate) .... At bedtime as needed 10)  Losartan Potassium-hctz 50-12.5 Mg Tabs (Losartan potassium-hctz) .[name].. 1 by mouth once daily 11)  Fluticasone Propionate 50 Mcg/act Susp (Fluticasone propionate) .[name].. 1 spray/nares daily 12)  Simvastatin 20 Mg Tabs (Simvastatin) .[name].. 1 by mouth qpm 13)  Alprazolam 0.5 Mg Tabs  (Alprazolam) .... 0.5-1 by mouth three times a day prn 14)  Hyoscyamine Sulfate 0.125 Mg Tabs (Hyoscyamine sulfate) .[name].. 1 every 2 to 4 hours as needed 15)  Promethazine Hcl 12.5 Mg Tabs (Promethazine hcl) .[name].. 1 by mouth q6 as needed fo rnausea 16)  Diphenoxylate-atropine 2.5-0.025 Mg Tabs (Diphenoxylate-atropine) .[name].. 1 by mouth after loose stool with a limit of 4 doses in 24 hrs for diarrhea 17)  Reglan 5 Mg Tabs (Metoclopramide hcl) .[name].. 1 every 6 hours as needed nausea Prescriptions: DIPHENOXYLATE-ATROPINE 2.5-0.025 MG TABS (DIPHENOXYLATE-ATROPINE) 1 by mouth after loose stool with a limit of 4 doses in 24 hrs for diarrhea  #16 x 1   Entered and Authorized by:   Neena Rhymes MD   Signed by:   Neena Rhymes MD on 07/05/2010   Method used:   Telephoned to ...       [facility] (retail)       [address]  [place], [place]  [id number]       Ph: [phone] or [phone]       Fax: [phone]   RxID:   [id number] PROMETHAZINE HCL 12.5 MG TABS (PROMETHAZINE HCL) 1 by mouth q6 as needed fo rnausea  #25 x 1   Entered and Authorized by:   Neena Rhymes MD   Signed by:   Neena Rhymes MD on 07/05/2010   Method used:   Electronically to        [facility] (retail)       [address] [place], [place]  [id number]       Ph: [phone] or [phone]       Fax: [phone]   RxID:   [id number]    Orders Added: 1)  TLB-BMP (Basic Metabolic Panel-BMET) [[id number]] 2)  TLB-CBC Platelet - w/Differential [85025-CBCD] 3)  TLB-TSH (Thyroid Stimulating Hormone) [84443-TSH] 4)  Est. Patient Level III [31281]

## 2010-07-26 NOTE — Progress Notes (Signed)
  Phone Note Refill Request Message from:  Fax from Pharmacy on July 02, 2010 11:55 AM  Refills Requested: Medication #1:  HYOSCYAMINE SULFATE 0.125 MG TABS 1 every 2 to 4 hours as needed.    Prescriptions: HYOSCYAMINE SULFATE 0.125 MG TABS (HYOSCYAMINE SULFATE) 1 every 2 to 4 hours as needed  #90 x 2   Entered by:   Ami Bullins CMA   Authorized by:   Neena Rhymes MD   Signed by:   Charlynne Cousins CMA on 07/02/2010   Method used:   Electronically to        [facility] (retail)       [address] [place], [place]  [id number]       Ph: [phone] or [phone]       Fax: [phone]   RxID:   [id number]

## 2010-07-26 NOTE — Progress Notes (Signed)
Summary: RF - AMBIEN  Phone Note Refill Request   Refills Requested: Medication #1:  AMBIEN 10 MG  TABS at bedtime as needed [facility]  Initial call taken by: Charlsie Quest, [place],  July 04, 2010 6:56 PM  Follow-up for Phone Call        ok for refill x 12 Follow-up by: Neena Rhymes MD,  July 04, 2010 10:51 PM    Prescriptions: AMBIEN 10 MG  TABS (ZOLPIDEM TARTRATE) at bedtime as needed  #30 x 11   Entered and Authorized by:   Crissie Sickles, CMA   Signed by:   Crissie Sickles, CMA on 07/05/2010   Method used:   Telephoned to ...       [facility] (retail)       [address] [place], [place]  [id number]       Ph: [phone] or [phone]       Fax: [phone]   RxID:   [id number]

## 2010-07-26 NOTE — Progress Notes (Signed)
  Phone Note Refill Request Message from:  Fax from Pharmacy on June 08, 2010 9:54 AM  Refills Requested: Medication #1:  AMBIEN 10 MG  TABS at bedtime as needed   Last Refilled: 05/01/2010  Medication #2:  PANTOPRAZOLE SODIUM 40 MG TBEC 1 two times a day fax from [facility], please Advise refills  Initial call taken by: Ami Bullins CMA,  June 08, 2010 9:54 AM  Follow-up for Phone Call        ok to refill as needed  Follow-up by: Neena Rhymes MD,  June 08, 2010 12:59 PM    Prescriptions: PANTOPRAZOLE SODIUM 40 MG TBEC (PANTOPRAZOLE SODIUM) 1 two times a day  #60 x 6   Entered by:   Ami Bullins CMA   Authorized by:   Neena Rhymes MD   Signed by:   Charlynne Cousins CMA on 06/08/2010   Method used:   Telephoned to ...       [facility] (retail)       [address] [place], [place]  [id number]       Ph: [phone] or [phone]       Fax: [phone]   RxID:   [id number] AMBIEN 10 MG  TABS (ZOLPIDEM TARTRATE) at bedtime as needed  #30 x 1   Entered by:   Ami Bullins CMA   Authorized by:   Neena Rhymes MD   Signed by:   Charlynne Cousins CMA on 06/08/2010   Method used:   Telephoned to ...       [facility] (retail)       [address] [place], [place]  [id number]       Ph: [phone] or [phone]       Fax: [phone]   RxID:   [id number]

## 2010-08-09 ENCOUNTER — Telehealth: Payer: Self-pay | Admitting: Internal Medicine

## 2010-08-09 DIAGNOSIS — R1319 Other dysphagia: Secondary | ICD-10-CM | POA: Insufficient documentation

## 2010-08-10 ENCOUNTER — Telehealth: Payer: Self-pay | Admitting: Gastroenterology

## 2010-08-15 NOTE — Progress Notes (Signed)
Summary: Triage  Phone Note Other Incoming   Caller: Debra @ Dr. Debby Bud [phone] Summary of Call: Increased problems swallowing. No inpaction or complete dysphagia....requesting pt. be seen in 7-10 days Initial call taken by: Karna Christmas,  August 10, 2010 9:58 AM  Follow-up for Phone Call        Scheduled patient to see Mike Gip, PA 08/17/10@10am . Scheduled with Debra Dr. Debby Bud, she will notify patient of appointment date and time.  Follow-up by: Selinda Michaels RN,  August 10, 2010 10:14 AM

## 2010-08-15 NOTE — Progress Notes (Signed)
Summary: referral  Phone Note Call from Patient Call back at Home Phone [phone] Call back at c [phone]   Caller: Mom Summary of Call: Patient lmovm requesting status of GI referral that was discussed. Did not see order in EMR, please advise Thanks.Ellison Hughs Archie CMA  August 09, 2010 12:07 PM   Follow-up for Phone Call        refer to GI for dysphagia, chronic N/V Follow-up by: Neena Rhymes MD,  August 09, 2010 1:36 PM  Additional Follow-up for Phone Call Additional follow up Details #1::        Patient notified.Ellison Hughs Archie CMA  August 09, 2010 1:48 PM   New Problems: OTHER DYSPHAGIA [phone])   New Problems: OTHER DYSPHAGIA (ICD-787.29)

## 2010-08-17 ENCOUNTER — Encounter: Payer: Self-pay | Admitting: Physician Assistant

## 2010-08-17 ENCOUNTER — Ambulatory Visit (INDEPENDENT_AMBULATORY_CARE_PROVIDER_SITE_OTHER): Payer: BC Managed Care – PPO | Admitting: Physician Assistant

## 2010-08-17 ENCOUNTER — Encounter: Payer: Self-pay | Admitting: Internal Medicine

## 2010-08-17 DIAGNOSIS — K589 Irritable bowel syndrome without diarrhea: Secondary | ICD-10-CM

## 2010-08-17 DIAGNOSIS — K219 Gastro-esophageal reflux disease without esophagitis: Secondary | ICD-10-CM | POA: Insufficient documentation

## 2010-08-17 DIAGNOSIS — R197 Diarrhea, unspecified: Secondary | ICD-10-CM | POA: Insufficient documentation

## 2010-08-17 DIAGNOSIS — A09 Infectious gastroenteritis and colitis, unspecified: Secondary | ICD-10-CM

## 2010-08-17 DIAGNOSIS — R1310 Dysphagia, unspecified: Secondary | ICD-10-CM

## 2010-08-17 DIAGNOSIS — K5730 Diverticulosis of large intestine without perforation or abscess without bleeding: Secondary | ICD-10-CM | POA: Insufficient documentation

## 2010-08-17 DIAGNOSIS — M1990 Unspecified osteoarthritis, unspecified site: Secondary | ICD-10-CM | POA: Insufficient documentation

## 2010-08-20 ENCOUNTER — Telehealth: Payer: Self-pay | Admitting: Internal Medicine

## 2010-08-20 DIAGNOSIS — R69 Illness, unspecified: Secondary | ICD-10-CM

## 2010-08-21 ENCOUNTER — Encounter: Payer: BC Managed Care – PPO | Admitting: Internal Medicine

## 2010-08-21 NOTE — Procedures (Signed)
Summary: [facility] COLON   Colonoscopy  Procedure date:  02/19/2005  Findings:      Location:  [facility].   Patient Name: Tammy, [name]. MRN:  Procedure Procedures: Colonoscopy CPT: [phone].  Personnel: Endoscopist: Ulyess Mort, MD.  Exam Location: Exam performed in Outpatient Clinic. Outpatient  Patient Consent: Procedure, Alternatives, Risks and Benefits discussed, consent obtained, from patient. Consent was obtained by the RN.  Indications  Surveillance of: Adenomatous Polyp(s).  Average Risk Screening Routine.  History  Current Medications: Patient is not currently taking Coumadin.  Pre-Exam Physical: Entire physical exam was normal.  Exam Exam: Extent of exam reached: Cecum, extent intended: Cecum.  The cecum was identified by appendiceal orifice and IC valve. Images were not taken. ASA Classification: II. Tolerance: good.  Monitoring: Pulse and BP monitoring, Oximetry used. Supplemental O2 given.  Colon Prep Prep results: good.  Sedation Meds: Patient assessed and found to be appropriate for moderate (conscious) sedation. Fentanyl 125 mcg. given IV. Versed 12 given IV.  Findings - DIVERTICULOSIS: Descending Colon to Sigmoid Colon. ICD9: Diverticulosis: 562.10. Comments: mild.   Assessment Abnormal examination, see findings above.  Diagnoses: 562.10: Diverticulosis.   Events  Unplanned Interventions: No intervention was required.  Unplanned Events: There were no complications. Plans Medication Plan: Continue current medications.  Patient Education: Patient given standard instructions for: Diverticulosis. Yearly hemoccult testing recommended. Patient instructed to get routine colonoscopy every 5 years.  Disposition: After procedure patient sent to recovery. After recovery patient sent home.   cc: Illene Regulus, MD  This report was created from the original endoscopy report, which was reviewed and signed by the above  listed endoscopist.

## 2010-08-21 NOTE — Procedures (Signed)
Summary: [facility] EGD Dr. Corinda Gubler   EGD  Procedure date:  02/19/2005  Findings:      Location: [facility]   Patient Name: [name], [name]. MRN:  Procedure Procedures: Panendoscopy (EGD) CPT: 43235.  Personnel: Endoscopist: Ulyess Mort, MD.  Exam Location: Exam performed in Outpatient Clinic. Outpatient  Patient Consent: Procedure, Alternatives, Risks and Benefits discussed, consent obtained, from patient. Consent was obtained by the RN.  Indications Symptoms: Dysphagia. Reflux symptoms  History  Current Medications: Patient is not currently taking Coumadin.  Pre-Exam Physical: Entire physical exam was normal.  Exam Exam Info: Maximum depth of insertion Duodenum, intended Duodenum. Patient position: on left side. Vocal cords visualized. Gastric retroflexion performed. Images were not taken. ASA Classification: II. Tolerance: good.  Sedation Meds: Patient assessed and found to be appropriate for moderate (conscious) sedation. Fentanyl 75 mcg. given IV. Versed 8 mg. given IV. Cetacaine Spray 2 sprays given aerosolized.  Monitoring: BP and pulse monitoring done. Oximetry used. Supplemental O2 given  Findings - OTHER FINDING: in Mid Esophagus. Comments: increased spasm.  - Dilation: Duodenal Bulb. Maloney dilator used, Diameter: 56 mm, Minimal Resistance, No Heme present on extraction. Patient tolerance excellent.  - HIATAL HERNIA: 2 cms. in length. ICD9: GERD: 530.81. - MUCOSAL ABNORMALITY: Body to Antrum. Granular mucosa. RUT done, results pending.  - MUCOSAL ABNORMALITY: Duodenal Bulb to Jejunum. Granular mucosa.   Assessment Abnormal examination, see findings above.  Diagnoses: 530.81: GERD.   Events  Unplanned Intervention: No unplanned interventions were required.  Unplanned Events: There were no complications. Plans Medication(s): Continue current medications. PPI:   Patient Education: Patient given standard instructions for:  Hiatal Hernia. Reflux. Mucosal Abnormality.  Disposition: After procedure patient sent to recovery. After recovery patient sent home.   cc:  Kayleen Memos. Norins, MD  This report was created from the original endoscopy report, which was reviewed and signed by the above listed endoscopist.

## 2010-08-21 NOTE — Letter (Signed)
Summary: [facility] Instructions  [facility]  [place], [place] [address]   Phone: [phone]  Fax: [phone]       [facility] BUTRUM    [birth year]    MRN: [id number]        Procedure Day /Date:08-21-2010     Arrival Time:1:30 PM      Procedure Time: 2:30 PM     Location of Procedure:                    X      [place] (4th Floor)                       [place]   Starting 5 days prior to your procedure 08-16-2010  do not eat nuts, seeds, popcorn, corn, beans, peas,  salads, or any raw vegetables.  Do not take any fiber supplements (e.g. Metamucil, Citrucel, and Benefiber).  THE DAY BEFORE YOUR PROCEDURE         DATE:08-20-2010  DAY: Monday  1.  Drink clear liquids the entire day-NO SOLID FOOD  2.  Do not drink anything colored red or purple.  Avoid juices with pulp.  No orange juice.  3.  Drink at least 64 oz. (8 glasses) of fluid/clear liquids during the day to prevent dehydration and help the prep work efficiently.  CLEAR LIQUIDS INCLUDE: Water Jello Ice Popsicles Tea (sugar ok, no milk/cream) Powdered fruit flavored drinks Coffee (sugar ok, no milk/cream) Gatorade Juice: apple, white grape, white cranberry  Lemonade Clear bullion, consomm, broth Carbonated beverages (any kind) Strained chicken noodle soup Hard Candy                             4.  In the morning, mix first dose of MoviPrep solution:    Empty 1 Pouch A and 1 Pouch B into the disposable container    Add lukewarm drinking water to the top line of the container. Mix to dissolve    Refrigerate (mixed solution should be used within 24 hrs)  5.  Begin drinking the prep at 5:00 p.m. The MoviPrep container is divided by 4 marks.   Every 15 minutes drink the solution down to the next mark (approximately 8 oz) until the full liter is complete.   6.  Follow completed prep with 16 oz of clear liquid of your choice (Nothing red  or purple).  Continue to drink clear liquids until bedtime.  7.  Before going to bed, mix second dose of MoviPrep solution:    Empty 1 Pouch A and 1 Pouch B into the disposable container    Add lukewarm drinking water to the top line of the container. Mix to dissolve    Refrigerate  THE DAY OF YOUR PROCEDURE      DATE: 08-21-2010 DAY: Tuesday  Beginning at 9:30 AM  (5 hours before procedure):         1. Every 15 minutes, drink the solution down to the next mark (approx 8 oz) until the full liter is complete.  2. Follow completed prep with 16 oz. of clear liquid of your choice.    3. You may drink clear liquids until 7:30 Am  (2 HOURS BEFORE PROCEDURE).   MEDICATION INSTRUCTIONS  Unless otherwise instructed, you should take regular prescription medications with a small sip of water   as early as possible the  morning of your procedure.           OTHER INSTRUCTIONS  You will need a responsible adult at least 65 years of age to accompany you and drive you home.   This person must remain in the waiting room during your procedure.  Wear loose fitting clothing that is easily removed.  Leave jewelry and other valuables at home.  However, you may wish to bring a book to read or  an iPod/MP3 player to listen to music as you wait for your procedure to start.  Remove all body piercing jewelry and leave at home.  Total time from sign-in until discharge is approximately 2-3 hours.  You should go home directly after your procedure and rest.  You can resume normal activities the  day after your procedure.  The day of your procedure you should not:   Drive   Make legal decisions   Operate machinery   Drink alcohol   Return to work  You will receive specific instructions about eating, activities and medications before you leave.    The above instructions have been reviewed and explained to me by   _______________________    I fully understand and can verbalize these  instructions _____________________________ Date _________

## 2010-08-27 ENCOUNTER — Telehealth (INDEPENDENT_AMBULATORY_CARE_PROVIDER_SITE_OTHER): Payer: Self-pay | Admitting: *Deleted

## 2010-08-30 ENCOUNTER — Ambulatory Visit (INDEPENDENT_AMBULATORY_CARE_PROVIDER_SITE_OTHER): Payer: BC Managed Care – PPO | Admitting: Internal Medicine

## 2010-08-30 ENCOUNTER — Ambulatory Visit: Payer: BC Managed Care – PPO | Admitting: Internal Medicine

## 2010-08-30 ENCOUNTER — Encounter: Payer: Self-pay | Admitting: Internal Medicine

## 2010-08-30 DIAGNOSIS — R279 Unspecified lack of coordination: Secondary | ICD-10-CM

## 2010-08-30 DIAGNOSIS — F329 Major depressive disorder, single episode, unspecified: Secondary | ICD-10-CM

## 2010-08-30 NOTE — Assessment & Plan Note (Addendum)
Summary: Problems swallowing / DIARRHEA, WT LOSS   History of Present Illness Visit Type: Initial Consult Primary GI MD: Yancey Flemings MD Primary Provider: Illene Regulus, MD Requesting Provider: Illene Regulus, MD Chief Complaint: Abd cramping and loose watery diarrhea  with 15 BM's from yesterday and today. Pt denies any bleeding. Pt did states she has been losing her appetite and has lost 30lbs since her father was dx with colon cancer last November.  History of Present Illness:   PLEASANT 64 YO FEMALE KNOWN TO DR. PERRY FROM EGD AND COLONOSCOPY IN 2006. SHE HAD A DISTAL ESOPHAGEAL STRICTURE DILATED. COLONOSCOPY NEGATIVE EXCEPT FOR DIVERTICULOSIS/LEFT COLON.  SHE COMES IN TODAY WITH C/O GENERALLY FEELING POORLY OVER THE PAST YEAR. SHE IS UNDER A LOT OF CONSTANT STRESS .[name] SHE REPORTS HX OF DIARRHEA OVER THE PAST 3 MONTHS. SHE HAS LOST 6 POUNDS OVER THE PAST MONTH OR SO. SHE SAYS EVERYTHING SHE EATS GOES RIGHT THRU HER.  ABOUT 24 HOURS AGO SHE HAD ONSET OF MULTIPLE DIARRHEAL STOOLS WITH 15 BM'S   ATLEAST. NO NAUSEA OR VOMITING. SHE HAS HAD ABDOMINAL CRAMPING. NO MELENA OR HEME.NO FEVER. SHE HAS NOT HAD ANY RECENT ANTIBIOTICS.  TODAY SO FAR SHE FEELS BETTER.  SHE ALSO HAS C/O INTERMITTENT DUSPHAGIA OVER THE PAST COUPLE MONTHS,PRIMARILY TO SOLIDS. SHE DENIES HEARTBURN OR INDIGESTION. SHE HAS BOTH LOMOTIL AND LEVSIN AT HOME BUT HAS NOT BEEN TAKING EITHER. HAS REGLAN FOR as needed USE BUT SAYS SHE DOES NOT TAKE.   GI Review of Systems    Reports abdominal pain and  weight loss.     Location of  Abdominal pain: lower abdomen. Weight loss of 40 lbs  pounds over 7-8 months.    Reports change in bowel habits and  diarrhea.     Denies anal fissure, black tarry stools, constipation, diverticulosis, fecal incontinence, heme positive stool, hemorrhoids, irritable bowel syndrome, jaundice, light color stool, liver problems, rectal bleeding, and  rectal pain.    Current Medications (verified): 1)  Concerta  36 Mg Cr-Tabs (Methylphenidate Hcl) .[name].. 1 Tab Daily Fill On or After 09/20/2010 2)  Synthroid 100 Mcg  Tabs (Levothyroxine Sodium) .... Once Daily 3)  Ketoprofen Cr 200 Mg  Cp24 (Ketoprofen) .... Once Daily 4)  Prozac 20 Mg  Caps (Fluoxetine Hcl) .... By Mouth Once Daily 5)  Wellbutrin Xl 300 Mg  Tb24 (Bupropion Hcl) .... Once Daily 6)  Pantoprazole Sodium 40 Mg Tbec (Pantoprazole Sodium) .[name].. 1 Two Times A Day 7)  Neurontin 300 Mg  Caps (Gabapentin) .... Three Times A Day 8)  Maxair Autohaler 200 Mcg/inh  Aerb (Pirbuterol Acetate) .[name].. 1 or 2 Puffs As Needed 9)  Ambien 10 Mg  Tabs (Zolpidem Tartrate) .... At Bedtime As Needed 10)  Losartan Potassium-Hctz 50-12.5 Mg Tabs (Losartan Potassium-Hctz) .[name].. 1 By Mouth Once Daily 11)  Fluticasone Propionate 50 Mcg/act  Susp (Fluticasone Propionate) .[name].. 1 Spray/nares Daily 12)  Simvastatin 20 Mg Tabs (Simvastatin) .[name].. 1 By Mouth Qpm 13)  Alprazolam 0.5 Mg Tabs (Alprazolam) .... 0.5-1 By Mouth Three Times A Day Prn 14)  Hyoscyamine Sulfate 0.125 Mg Tabs (Hyoscyamine Sulfate) .[name].. 1 Every 2 To 4 Hours As Needed 15)  Promethazine Hcl 12.5 Mg Tabs (Promethazine Hcl) .[name].. 1 By Mouth Q6 As Needed Fo Rnausea 16)  Diphenoxylate-Atropine 2.5-0.025 Mg Tabs (Diphenoxylate-Atropine) .[name].. 1 By Mouth After Loose Stool With A Limit of 4 Doses in 24 Hrs For Diarrhea 17)  Reglan 5 Mg Tabs (Metoclopramide Hcl) .[name].. 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 Ranell Patrick COLONOSCOPY 2006-PERRY EGD 2006-PERRY   G3P3  Family History: Reviewed history from 03/29/2009 and no changes required. mother - deceased in her late 80's:COPD, cardiomyopathy, CAD Father-1915: HTN, chronic prosthetic hip replacement Neg- breast or colon cancer; DM Family History of Colon Cancer:Father dx in November  Social History: Reviewed history from 03/29/2009 and no changes required. [employer]; post-graduate study [employer]. 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 3 children: 2 daughters - '70, '72; 1 son '82; 6 grand-children Lost her mother 2008, elderly father lives alone-in [place] SO-in good health  Review of Systems  The patient denies allergy/sinus, anemia, anxiety-new, arthritis/joint pain, back pain, blood in urine, breast changes/lumps, change in vision, confusion, cough, coughing up blood, depression-new, fainting, fatigue, fever, headaches-new, hearing problems, heart murmur, heart rhythm changes, itching, menstrual pain, muscle pains/cramps, night sweats, nosebleeds, pregnancy symptoms, shortness of breath, skin rash, sleeping problems, sore throat, swelling of feet/legs, swollen lymph glands, thirst - excessive , urination - excessive , urination changes/pain, urine leakage, vision changes, and voice change.         SEE HPI  Vital Signs:  Patient profile:   65 year old female Height:      69 inches Weight:      156 pounds BMI:     23.12 Pulse rate:   90 / minute Pulse rhythm:   regular BP sitting:   126 / 68  (left arm) Cuff size:   regular  Vitals Entered By: Christie Nottingham CMA Duncan Dull) (August 17, 2010 10:26 AM)  Physical Exam  General:  Well developed, well nourished, no acute  distress. Head:  Normocephalic and atraumatic. Eyes:  PERRLA, no icterus. Lungs:  Clear throughout to auscultation. Heart:  Regular rate and rhythm; no murmurs, rubs,  or bruits. Abdomen:  SOFT, MILDLY TENDER ACROSS LOWER ABDOMEN, NO GUARDING, B, NO MASS OR HSM Rectal:  NOT DONE Extremities:  No clubbing, cyanosis, edema or deformities noted. Neurologic:  Alert and  oriented x4;  grossly normal neurologically. Psych:  Alert and cooperative. Normal mood and affect.anxious.     Impression & Recommendations:  Problem # 1:  DIARRHEA (ICD-787.91) Assessment Deteriorated 64 YO FEMALE WITH HX OF DIVERTICULOSIS,IBS AND DIARRHEA X 3 MONTHS WITH ACUTE WORSENING X 24 HOURS. SUSPECT SXS SECONDARY TO IBS/ANXIETY, R/O UNDERLYING COLITIS/MICROSCOPIC COLITIS  RESTART LEVSIN SL Q 6 HOURS AS NEEDED USE LOMOTIL AS NEEDED 1-2 X DAILY SCHEDULE FOR COLONOSCOPY  WITH DR. PERRY.LAST PROCEDURE 5 YEARS AGO, AND PT NOW ALSO HAS FAMILY HX OF COLON CANCER IN FATHER.PROCEDURE DISCUSSED IN DETAIL WITH PT.  Problem # 2:  OTHER DYSPHAGIA (ICD-787.29) Assessment: Deteriorated RECURRENT DYSPHAGIA/ HX OF ESOPHAGEAL STRICTURE.   CONTINUE PROTONIX 40 MG  TWICE DAILY SCHEDULE FOR EGD WITH PROBABLE SAVARY DILATION WITH DR. PERRY. PROCEDURE DISCUSSED IN DETAIL WITH PT  Orders: Colon/Endo (Colon/Endo)  Problem # 3:  IBS (ICD-564.1) Assessment: Comment Only  Orders: Colon/Endo (Colon/Endo)  Problem # 4:  HYPERTENSION, ESSENTIAL NOS (ICD-401.9) Assessment: Comment Only  Patient Instructions: 1)  We scheduled the colonoscopy /Endoscopy with Dr. Yancey Flemings on 08-21-2010. 2)  Directions and brochure provided. 3)  [facility] Patient Information Guide given to patient. 4)  We sent prescriptions for the colonscopy prep and Levsin Sub Lingual to [facility]. 5)  Copy sent to : Illene Regulus, MD 6)  The medication list was reviewed and reconciled.  All changed / newly prescribed medications were  explained.  A complete medication list was provided to the patient / caregiver. Prescriptions: LEVSIN/SL 0.125 MG SUBL (HYOSCYAMINE SULFATE) Put 1 tab on your tongue to dissolve every 6 hours as needed for pain and spasms  #45 x 1   Entered by:   Lowry Ram NCMA   Authorized by:   Sammuel Cooper PA-c   Signed by:   Lowry Ram NCMA on 08/17/2010   Method used:   Electronically to        [facility] (retail)       [address]       [place], [place]  [id number]       Ph: [phone] or [phone]       Fax: [phone]   RxID:   [id number] MOVIPREP 100 GM  SOLR (PEG-KCL-NACL-NASULF-NA ASC-C) As per prep instructions.  #1 x 0   Entered by:   Lowry Ram NCMA   Authorized by:   Sammuel Cooper PA-c   Signed by:   Lowry Ram NCMA on 08/17/2010   Method used:   Electronically to        [facility] (retail)       [address]       [place], [place]  [id number]       Ph: [phone] or [phone]       Fax: [phone]   RxID:   [id number]

## 2010-09-04 NOTE — Assessment & Plan Note (Signed)
Summary: consult/lb   Vital Signs:  Patient profile:   65 year old female Height:      69 inches Weight:      156 pounds BMI:     23.12 O2 Sat:      97 % on Room air Temp:     98.1 degrees F oral Pulse rate:   83 / minute BP sitting:   122 / 62  (left arm) Cuff size:   regular  Vitals Entered By: Charlynne Cousins CMA (August 30, 2010 9:51 AM)  O2 Flow:  Room air CC: pt here for follow up office visit and to discuss medications/ ab   Primary Care Provider:  Adella Hare, MD  CC:  pt here for follow up office visit and to discuss medications/ ab.  History of Present Illness: Mrs. [name] presents c/o increased depression. She has a history of depresssion, anxiety and ADD. she is on multiple medications managed by her psychiatrist. She has had increased emotional stress with the terminal care for her father, 30 iwth recnelty diagnosed colon cancer at risk for SBO. There is a lot of help with his care but she has shouldered the care-giver role. He is a hospice patient and she has had anticipatory bereavement counseling. This plus the additional strain in her relationship with her husband around this set of circumstances is a major factor in her depression.  She is having increasing trouble with balance and has had several falls. She has had no focal weakness, loss of sensation, headache, double vision or other CNS symptoms. In addition she is having memory problems and has no recall for several events, including falling down the stairs.  Lastly. she is having a problem with memory and cognition.  Current Medications (verified): 1)  Concerta 36 Mg Cr-Tabs (Methylphenidate Hcl) .[name].. 1 Tab Daily Fill On or After 09/20/2010 2)  Synthroid 100 Mcg  Tabs (Levothyroxine Sodium) .... Once Daily 3)  Ketoprofen Cr 200 Mg  Cp24 (Ketoprofen) .... Once Daily 4)  Prozac 20 Mg  Caps (Fluoxetine Hcl) .... By Mouth Once Daily 5)  Wellbutrin Xl 300 Mg  Tb24 (Bupropion Hcl) .... Once Daily 6)   Pantoprazole Sodium 40 Mg Tbec (Pantoprazole Sodium) .[name].. 1 Two Times A Day 7)  Neurontin 300 Mg  Caps (Gabapentin) .... Three Times A Day 8)  Maxair Autohaler 200 Mcg/inh  Aerb (Pirbuterol Acetate) .[name].. 1 or 2 Puffs As Needed 9)  Ambien 10 Mg  Tabs (Zolpidem Tartrate) .... At Bedtime As Needed 10)  Losartan Potassium-Hctz 50-12.5 Mg Tabs (Losartan Potassium-Hctz) .[name].. 1 By Mouth Once Daily 11)  Fluticasone Propionate 50 Mcg/act  Susp (Fluticasone Propionate) .[name].. 1 Spray/nares Daily 12)  Simvastatin 20 Mg Tabs (Simvastatin) .[name].. 1 By Mouth Qpm 13)  Alprazolam 0.5 Mg Tabs (Alprazolam) .... 0.5-1 By Mouth Three Times A Day Prn 14)  Hyoscyamine Sulfate 0.125 Mg Tabs (Hyoscyamine Sulfate) .[name].. 1 Every 2 To 4 Hours As Needed 15)  Promethazine Hcl 12.5 Mg Tabs (Promethazine Hcl) .[name].. 1 By Mouth Q6 As Needed Fo Rnausea 16)  Diphenoxylate-Atropine 2.5-0.025 Mg Tabs (Diphenoxylate-Atropine) .[name].. 1 By Mouth After Loose Stool With A Limit of 4 Doses in 24 Hrs For Diarrhea 17)  Reglan 5 Mg Tabs (Metoclopramide Hcl) .[name].. 1 Every 6 Hours As Needed Nausea 18)  Moviprep 100 Gm  Solr (Peg-Kcl-Nacl-Nasulf-Na Asc-C) .... As Per Prep Instructions. 19)  Levsin/sl 0.125 Mg Subl (Hyoscyamine Sulfate) .... 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 Veverly Fells COLONOSCOPY 2006-PERRY EGD 2006-PERRY   G3P3  Family History: mother - deceased in her late 80's:COPD, cardiomyopathy, CAD Father-1915: HTN, chronic prosthetic hip replacement infection, colon cancer at home with Hospice (W '12) Neg- breast or colon cancer; DM Family History of Colon Cancer:Father dx in November  Social History: Reviewed history from 03/29/2009 and no changes required. [employer]; post-graduate study [employer]. 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  The patient denies anorexia, fever, vision loss, decreased hearing, hoarseness, chest pain, dyspnea on exertion, peripheral edema, prolonged cough, headaches, abdominal pain, severe indigestion/heartburn, hematuria, muscle weakness, suspicious skin lesions, abnormal bleeding, enlarged lymph nodes, and angioedema.   Neuro:  Complains of difficulty with concentration, disturbances in coordination, falling down, memory loss, and poor balance; denies brief paralysis, headaches, inability to speak, seizures, sensation of room spinning, tingling, and visual disturbances. Psych:  Complains of anxiety, depression, easily tearful, and irritability; denies alternate hallucination ( auditory/visual), easily angered, mental problems, panic attacks, sense of great danger, and suicidal thoughts/plans.  Physical Exam  General:  alert, well-developed, well-nourished, and well-hydrated.   Head:  normocephalic and atraumatic.   Eyes:  vision grossly intact, pupils equal, and pupils round.   Neck:  supple  and full ROM.   Lungs:  normal respiratory effort and normal breath sounds.   Heart:  normal rate, regular rhythm, no murmur, no gallop, and no JVD.   Msk:  no joint tenderness, no joint swelling, no joint warmth, and no redness over joints.   Pulses:  2+ radial Neurologic:  MMSE: 1. Day-ok year -ok 2. Content - president -ok, Gov - ok 3. content- current events: new iPAD,  4. 5 fwd - ok; 5 rev - 4 rev -ok; world 5. serial 7's - 1 erro but correct; change -fluid 6. word recall: 2/3  7. naming  -ok, 4 legged - fluid 8. parables - fluid on house, not on unfamiliar 9. judgement - ok 10. Clock - fluid and accurate  alert & oriented X3, cranial nerves II-XII intact, strength normal in all extremities, gait normal, and DTRs symmetrical and normal. Finger to nose slow, heel to shin done with effort. Postive for dysdiadochokinesia with slow movement. Cannot tandem gait.  Skin:  turgor normal and color normal.  Bruises on right arm. Psych:  Oriented X3, memory intact for recent and remote, good eye contact, and moderately anxious.     Impression & Recommendations:  Problem # 1:  ATAXIA (XBM-841.3) Patient's exam is suggestive of cerebellar dysfunction. Concern for possible CVA vs V-B insufficiency  Plan - MRI brain and MRA intracranial  Orders: Radiology Referral (Radiology)  Problem # 2:  DEPRESSION (ICD-311) Chronic conditions with  exacerbation. 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  Plan - continue bereavement counseling and establish boundries in regard to the care of her father           consult with psychiatrist about any modification of medications.           step up personal counselng           encourage husband to seek counseling as well.   Her updated medication list for this problem includes:    Prozac 20 Mg Caps (Fluoxetine hcl) ..... By mouth once daily    Wellbutrin Xl 300 Mg Tb24 (Bupropion hcl)  ..... Once daily    Alprazolam 0.5 Mg Tabs (Alprazolam) .[name]... 0.5-1 by mouth three times a day prn  Complete Medication List: 1)  Concerta 36 Mg Cr-tabs (Methylphenidate hcl) .[name].. 1 tab daily fill on or after 09/20/2010 2)  Synthroid 100 Mcg Tabs (Levothyroxine sodium) .... Once daily 3)  Ketoprofen Cr 200 Mg Cp24 (Ketoprofen) .... Once daily 4)  Prozac 20 Mg Caps (Fluoxetine hcl) .... By mouth once daily 5)  Wellbutrin Xl 300 Mg Tb24 (Bupropion hcl) .... Once daily 6)  Pantoprazole Sodium 40 Mg Tbec (Pantoprazole sodium) .[name].. 1 two times a day 7)  Neurontin 300 Mg Caps (Gabapentin) .... Three times a day 8)  Maxair Autohaler 200 Mcg/inh Aerb (Pirbuterol acetate) .[name].. 1 or 2 puffs as needed 9)  Ambien 10 Mg Tabs (Zolpidem tartrate) .... At bedtime as needed 10)  Losartan Potassium-hctz 50-12.5 Mg Tabs (Losartan potassium-hctz) .[name].. 1 by mouth once daily 11)  Fluticasone Propionate 50 Mcg/act Susp (Fluticasone propionate) .[name].. 1 spray/nares daily 12)  Simvastatin 20 Mg Tabs (Simvastatin) .[name].. 1 by mouth qpm 13)  Alprazolam 0.5 Mg Tabs (Alprazolam) .... 0.5-1 by mouth three times a day prn 14)  Hyoscyamine Sulfate 0.125 Mg Tabs (Hyoscyamine sulfate) .[name].. 1 every 2 to 4 hours as needed 15)  Promethazine Hcl 12.5 Mg Tabs (Promethazine hcl) .[name].. 1 by mouth q6 as needed fo rnausea 16)  Diphenoxylate-atropine 2.5-0.025 Mg Tabs (Diphenoxylate-atropine) .[name].. 1 by mouth after loose stool with a limit of 4 doses in 24 hrs for diarrhea 17)  Reglan 5 Mg Tabs (Metoclopramide hcl) .[name].. 1 every 6 hours as needed nausea 18)  Moviprep 100 Gm Solr (Peg-kcl-nacl-nasulf-na asc-c) .... As per prep instructions. 19)  Levsin/sl 0.125 Mg Subl (Hyoscyamine sulfate) .... Put 1 tab on your tongue to dissolve every 6 hours as needed for pain and spasms   Orders Added: 1)  Radiology Referral [Radiology] 2)  Est. Patient Level IV [63817]

## 2010-09-04 NOTE — Progress Notes (Signed)
Summary: WORK IN?   Phone Note Call from Patient   Summary of Call: Patient is requesting office visit w/Dr Norins for "mental problem" Ok to work pt in tomorrow, end of the day?  Initial call taken by: Lamar Sprinkles, CMA,  August 27, 2010 4:07 PM  Follow-up for Phone Call        Called patient - she is not in urgent distress. Thursday will be just fine. Please work her into my schedule. Follow-up by: Jacques Navy MD,  August 27, 2010 5:56 PM  Additional Follow-up for Phone Call Additional follow up Details #1::        Please set up pt for 30 min office visit on Thursday. THANKS!! Additional Follow-up by: Lamar Sprinkles, CMA,  August 27, 2010 6:00 PM    Additional Follow-up for Phone Call Additional follow up Details #2::    Pt seen 3/8. Follow-up by: Verdell Face,  August 31, 2010 8:14 AM

## 2010-09-04 NOTE — Progress Notes (Signed)
Summary: Canceled COL  Phone Note Call from Patient   Caller: Patient Call For: Dr. Marina Goodell Summary of Call: pt. canceled her procedure for tomorrow. She is very upset, she just found out her father has COL cancer. Would you like pt. charged the cancelation fee? Initial call taken by: Karna Christmas,  August 20, 2010 4:27 PM  Follow-up for Phone Call         as best I can tell, her father was diagnosed with colon cancer last November. I would   she wait  less than  24 hours before the exam to cancel. I think she should be charged a no-show  fee,  as she  should have been aware of the policy Follow-up by: Hilarie Fredrickson MD,  August 20, 2010 4:39 PM  Additional Follow-up for Phone Call Additional follow up Details #1::        Patient BILLED Colon Cx fee. Additional Follow-up by: Leanor Kail [facility],  August 31, 2010 8:56 AM

## 2010-09-05 ENCOUNTER — Other Ambulatory Visit: Payer: Self-pay | Admitting: Internal Medicine

## 2010-09-05 ENCOUNTER — Telehealth (INDEPENDENT_AMBULATORY_CARE_PROVIDER_SITE_OTHER): Payer: Self-pay | Admitting: *Deleted

## 2010-09-05 DIAGNOSIS — R279 Unspecified lack of coordination: Secondary | ICD-10-CM

## 2010-09-11 NOTE — Letter (Signed)
Summary: [facility] / [place]   [facility] / [place]   Imported By: Rise Patience 09/04/2010 10:23:37  _____________________________________________________________________  External Attachment:    Type:   Image     Comment:   External Document

## 2010-09-12 ENCOUNTER — Other Ambulatory Visit (HOSPITAL_COMMUNITY): Payer: BC Managed Care – PPO

## 2010-09-20 NOTE — Progress Notes (Signed)
  Phone Note Call from Patient   Caller: Patient Summary of Call: Patient called [facility] requesting call back from Dr. Debby Bud nurse.Alvy Beal Archie CMA  September 05, 2010 4:51 PM  Initial call taken by: Rock Nephew CMA,  September 05, 2010 4:51 PM  Follow-up for Phone Call        lmoam for pt to call back Follow-up by: Ami Bullins CMA,  September 05, 2010 4:55 PM  Additional Follow-up for Phone Call Additional follow up Details #1::        Pt has an appt for an MRI on September 12, 2010. She states she is going out of town for two weeks and needs the appt sch at the begining of april. Can you resch. this Debra Additional Follow-up by: Ami Bullins CMA,  September 07, 2010 9:48 AM    Additional Follow-up for Phone Call Additional follow up Details #2::    appt  rescheduled for April 9@10 :00 wl Follow-up by: Shelbie Proctor,  September 12, 2010 9:51 AM

## 2010-10-01 ENCOUNTER — Other Ambulatory Visit (HOSPITAL_COMMUNITY): Payer: BC Managed Care – PPO

## 2010-10-01 ENCOUNTER — Inpatient Hospital Stay (HOSPITAL_COMMUNITY): Admission: RE | Admit: 2010-10-01 | Payer: BC Managed Care – PPO | Source: Ambulatory Visit

## 2010-10-01 LAB — URINALYSIS, ROUTINE W REFLEX MICROSCOPIC
Hgb urine dipstick: NEGATIVE
Nitrite: NEGATIVE
Protein, ur: NEGATIVE mg/dL
Specific Gravity, Urine: 1.011 (ref 1.005–1.030)
Urobilinogen, UA: 0.2 mg/dL (ref 0.0–1.0)

## 2010-10-01 LAB — BASIC METABOLIC PANEL
BUN: 21 mg/dL (ref 6–23)
BUN: 24 mg/dL — ABNORMAL HIGH (ref 6–23)
BUN: 28 mg/dL — ABNORMAL HIGH (ref 6–23)
CO2: 25 mEq/L (ref 19–32)
CO2: 27 mEq/L (ref 19–32)
CO2: 27 mEq/L (ref 19–32)
Chloride: 100 mEq/L (ref 96–112)
Chloride: 103 mEq/L (ref 96–112)
Chloride: 103 mEq/L (ref 96–112)
Creatinine, Ser: 1.02 mg/dL (ref 0.4–1.2)
Creatinine, Ser: 1.62 mg/dL — ABNORMAL HIGH (ref 0.4–1.2)
GFR calc Af Amer: 39 mL/min — ABNORMAL LOW (ref >60)
GFR calc non Af Amer: 25 mL/min — ABNORMAL LOW (ref >60)
GFR calc non Af Amer: 32 mL/min — ABNORMAL LOW (ref >60)
GFR calc non Af Amer: 35 mL/min — ABNORMAL LOW (ref >60)
Glucose, Bld: 107 mg/dL — ABNORMAL HIGH (ref 70–99)
Glucose, Bld: 108 mg/dL — ABNORMAL HIGH (ref 70–99)
Potassium: 4.1 mEq/L (ref 3.5–5.1)
Potassium: 4.6 mEq/L (ref 3.5–5.1)
Sodium: 138 mEq/L (ref 135–145)

## 2010-10-01 LAB — CBC
HCT: 29.2 % — ABNORMAL LOW (ref 36.0–46.0)
Hemoglobin: 12.7 g/dL (ref 12.0–15.0)
Hemoglobin: 9.9 g/dL — ABNORMAL LOW (ref 12.0–15.0)
MCHC: 34.3 g/dL (ref 30.0–36.0)
MCV: 91.7 fL (ref 78.0–100.0)
Platelets: 144 10*3/uL — ABNORMAL LOW (ref 150–400)
Platelets: 155 10*3/uL (ref 150–400)
RDW: 12.7 % (ref 11.5–15.5)
RDW: 13.1 % (ref 11.5–15.5)
RDW: 13.3 % (ref 11.5–15.5)
WBC: 6 10*3/uL (ref 4.0–10.5)

## 2010-10-01 LAB — DIFFERENTIAL
Eosinophils Absolute: 0.3 10*3/uL (ref 0.0–0.7)
Eosinophils Relative: 5 % (ref 0–5)
Lymphocytes Relative: 23 % (ref 12–46)
Lymphs Abs: 1.6 10*3/uL (ref 0.7–4.0)
Monocytes Absolute: 0.7 10*3/uL (ref 0.1–1.0)
Monocytes Relative: 10 % (ref 3–12)

## 2010-10-01 LAB — TYPE AND SCREEN
ABO/RH(D): A POS
Antibody Screen: NEGATIVE

## 2010-10-01 LAB — ABO/RH: ABO/RH(D): A POS

## 2010-10-01 LAB — PROTIME-INR: Prothrombin Time: 12.8 seconds (ref 11.6–15.2)

## 2010-10-04 ENCOUNTER — Encounter: Payer: BC Managed Care – PPO | Admitting: Internal Medicine

## 2010-10-25 ENCOUNTER — Telehealth: Payer: Self-pay | Admitting: *Deleted

## 2010-10-25 DIAGNOSIS — F411 Generalized anxiety disorder: Secondary | ICD-10-CM

## 2010-10-25 DIAGNOSIS — F329 Major depressive disorder, single episode, unspecified: Secondary | ICD-10-CM

## 2010-10-25 DIAGNOSIS — F988 Other specified behavioral and emotional disorders with onset usually occurring in childhood and adolescence: Secondary | ICD-10-CM

## 2010-10-25 NOTE — Telephone Encounter (Signed)
Patient requesting referral to Dr Caroll Rancher (psychiatrist) - says her office needs referral.

## 2010-10-25 NOTE — Telephone Encounter (Signed)
Referral done

## 2010-10-30 ENCOUNTER — Encounter: Payer: Self-pay | Admitting: Internal Medicine

## 2010-10-30 ENCOUNTER — Telehealth: Payer: Self-pay | Admitting: *Deleted

## 2010-10-30 ENCOUNTER — Ambulatory Visit (AMBULATORY_SURGERY_CENTER): Payer: BC Managed Care – PPO

## 2010-10-30 ENCOUNTER — Telehealth: Payer: Self-pay

## 2010-10-30 VITALS — Ht 66.0 in | Wt 151.6 lb

## 2010-10-30 DIAGNOSIS — Z800 Family history of malignant neoplasm of digestive organs: Secondary | ICD-10-CM

## 2010-10-30 DIAGNOSIS — R1310 Dysphagia, unspecified: Secondary | ICD-10-CM

## 2010-10-30 MED ORDER — PEG-KCL-NACL-NASULF-NA ASC-C 100 G PO SOLR: 1.0000 | Freq: Once | ORAL | Status: AC

## 2010-10-30 NOTE — Telephone Encounter (Signed)
Pt came into office for a pre-visit today.Due to pt being on Concerta ,Wellbutrin,Xanax, Prozac, Lamictal, I advised pt to schedule her colon/endo with propofol. Pt refused propofol saying she did not need deep sedation because she did fine with the last colon. The pt had Fentanyl and Versed 12mg  with last colon with Dr Doreatha Martin in 2006. Please advise. Ulis Rias

## 2010-10-30 NOTE — Telephone Encounter (Signed)
Pt left mess - She says letter only needs to state pt needs further review regarding meds she takes.

## 2010-10-30 NOTE — Progress Notes (Signed)
Pt came into office for a previsit on 10/30/10.Reviewed meds with pt and since she was on 4 different mood enhancers, I advised pt to schedule the colon/endo  with propofol. Pt refused and stated she did not need deep sedation since she is only taking these meds due to the loss of her father 2 months ago.Will notify Dr Henrene Pastor.

## 2010-10-30 NOTE — Telephone Encounter (Signed)
Pt came in today. She needs a letter of necessity sent to Dr Madaline Guthrie and states she needs another order put in for an MRA

## 2010-10-30 NOTE — Telephone Encounter (Signed)
I think that she would be an excellent propofol candidate and I would support this. However, if she declines, we will do the best that we can do with standard conscious sedation

## 2010-10-31 NOTE — Telephone Encounter (Signed)
Unfortunately all psychiatrists are hard to get in and have long waiting lists. I do not have any particular pull to help here. If it is a medication issue perhaps I can help in the short term....happy to see in the office

## 2010-10-31 NOTE — Telephone Encounter (Signed)
Pt left ANOTHER message - She left her previous psych MD b/c they told her they could not do anything further. Now says this psych Dr Madaline Guthrie ONLY needs a referral but can't get in for 3 weeks. She is desperate to see psychiatrist asap and wants to know what Dr Debby Bud thinks? Does he have MD that could see her soon?

## 2010-11-01 ENCOUNTER — Ambulatory Visit (INDEPENDENT_AMBULATORY_CARE_PROVIDER_SITE_OTHER): Payer: BC Managed Care – PPO | Admitting: Psychology

## 2010-11-01 DIAGNOSIS — F331 Major depressive disorder, recurrent, moderate: Secondary | ICD-10-CM

## 2010-11-02 ENCOUNTER — Encounter: Payer: Self-pay | Admitting: Internal Medicine

## 2010-11-05 ENCOUNTER — Encounter: Payer: Self-pay | Admitting: Internal Medicine

## 2010-11-05 ENCOUNTER — Ambulatory Visit (INDEPENDENT_AMBULATORY_CARE_PROVIDER_SITE_OTHER): Payer: BC Managed Care – PPO | Admitting: Internal Medicine

## 2010-11-05 ENCOUNTER — Other Ambulatory Visit (INDEPENDENT_AMBULATORY_CARE_PROVIDER_SITE_OTHER): Payer: BC Managed Care – PPO

## 2010-11-05 DIAGNOSIS — L659 Nonscarring hair loss, unspecified: Secondary | ICD-10-CM

## 2010-11-05 DIAGNOSIS — E039 Hypothyroidism, unspecified: Secondary | ICD-10-CM

## 2010-11-05 DIAGNOSIS — R634 Abnormal weight loss: Secondary | ICD-10-CM

## 2010-11-05 LAB — HEPATIC FUNCTION PANEL
ALT: 13 U/L (ref 0–35)
AST: 15 U/L (ref 0–37)
Alkaline Phosphatase: 67 U/L (ref 39–117)
Bilirubin, Direct: 0 mg/dL (ref 0.0–0.3)
Total Bilirubin: 0.5 mg/dL (ref 0.3–1.2)
Total Protein: 5.7 g/dL — ABNORMAL LOW (ref 6.0–8.3)

## 2010-11-05 LAB — COMPREHENSIVE METABOLIC PANEL
AST: 15 U/L (ref 0–37)
Albumin: 3.6 g/dL (ref 3.5–5.2)
Alkaline Phosphatase: 67 U/L (ref 39–117)
BUN: 12 mg/dL (ref 6–23)
Creatinine, Ser: 0.8 mg/dL (ref 0.4–1.2)
Glucose, Bld: 92 mg/dL (ref 70–99)
Total Bilirubin: 0.5 mg/dL (ref 0.3–1.2)

## 2010-11-05 LAB — VITAMIN B12: Vitamin B-12: 371 pg/mL (ref 211–911)

## 2010-11-05 LAB — T4, FREE: Free T4: 1.02 ng/dL (ref 0.60–1.60)

## 2010-11-05 MED ORDER — GABAPENTIN 300 MG PO CAPS: 300.0000 mg | ORAL_CAPSULE | Freq: Three times a day (TID) | ORAL | Status: DC

## 2010-11-05 MED ORDER — METHYLPHENIDATE HCL ER (OSM) 36 MG PO TBCR: 36.0000 mg | EXTENDED_RELEASE_TABLET | ORAL | Status: DC

## 2010-11-05 NOTE — Telephone Encounter (Signed)
Pt seen in the office today.  

## 2010-11-06 ENCOUNTER — Encounter: Payer: Self-pay | Admitting: Internal Medicine

## 2010-11-06 NOTE — Assessment & Plan Note (Signed)
[facility]                           PRIMARY CARE OFFICE NOTE   NAME:Berberian, MARGEL JOENS                     MRN:          [id number]  DATE:11/19/2006                            DOB:          12/17/1945    Ms. Orama is a 65 year old women who presents today acutely because  of increased urinary frequency, burning and discomfort.  She has had no  fevers or chills.  She has been able to keep foods and fluids without  difficulty.  She had no back pain or discomfort.   VITAL SIGNS:  Temperature was 97.4, blood pressure 139/87, pulse 83,  weight 187.  GENERAL APPEARANCE:  This is a heavyset women in no acute distress.   Dipstick urinalysis was markedly positive for leukocyte esterase, blood  and bacteria.   PLAN:  Ciprofloxacin 250 mg b.i.d. for 5 days.   SOCIAL HISTORY:  The patient's mother recently passed away.  The patient  has made a good adjustment to her loss.  She reports her father is  living independently.  He is more active than ever.  He continues to  drive and manage his own activities of daily living.   The patient is told to contact me if her symptoms are not relieved by  antibiotic therapy.  Otherwise she will return on a p.r.n. basis.     [name], MD  Electronically Signed    MEN/MedQ  DD: 11/20/2006  DT: 11/20/2006  Job #: [id number]

## 2010-11-06 NOTE — Progress Notes (Signed)
Subjective:    Patient ID: [name], female    DOB: 06-02-1946, 65 y.o.   MRN: [id number]  HPI [name] presents for follow-up. She has had a 45 lb weight loss during the past several months when her father was terminally ill. She is also complaining of severe fatigue, loss of hair and muscle weakness. She denies fever, sweats, abnormal lymphadenopathy, GI symptoms. She has IBS but reports that her diarrhea has been worse for several months: 2-5 loose stools a day. There has been no hematochezia or mucus in the stools. She has been taking levsin prn abdominal pain and bloating. She reports that she has an appointment for EGD and colonoscopy but that she is planning to reschedule until after her 65th birthday this spring.  [name] has chronic depression but this has also been worse with the terminal illness of her father after a several year decline which required a lot of her time and energy. There were many pluses in getting closer to her father but it was very demanding. She has continued on multiple medications including wellbutrin, prozac, lamictal, zolpidem and concerta. She has started to see a counselor, Jon Billings in [facility] and has an appointment in several days to see Dr. Carlton Adam, psychiatrist, for medication management.  She reports that she is not suicidal.   Past Medical History  Diagnosis Date  . Anxiety   . Depression   . Thyroid disease   . Allergy   . GERD (gastroesophageal reflux disease)   . IBS (irritable bowel syndrome)    Past Surgical History  Procedure Date  . Cholecystectomy   . Colonoscopy   . Upper gastrointestinal endoscopy   . Back surgery     fluid removed from between disks  . Shoulder surgery     right shoulder 3x   . Shoulder replscement   . Toe surgery     right big toe   Family History  Problem Relation Age of Onset  . Colon cancer Father   . Hypertension Father   . Cancer Father 25    colon  . Arthritis Father    . Heart disease Father     CHF  . Diabetes Neg Hx   . Hyperlipidemia Neg Hx    History   Social History  . Marital Status: Married    Spouse Name: N/A    Number of Children: 3  . Years of Education: 16   Occupational History  . interior decorator    Social History Main Topics  . Smoking status: Former Smoker    Quit date: 10/30/1987  . Smokeless tobacco: Never Used  . Alcohol Use: 0.5 oz/week    1 drink(s) per week  . Drug Use: No  . Sexually Active: Not on file   Other Topics Concern  . Not on file   Social History Narrative   [employer]; post-graduate study [employer].  married '68.  3 children: 2 daughters - '70, '72; 1 son '82; 6 grand-children. Lost her mother 2008, elderly father lives alone-in [place] died in Sep 25, 2022. owner/operator interior Agricultural consultant business. SO-in good health.  Marriage is a work in progress       Review of Systems Review of Systems  Constitutional:  Negative for fever, chills, activity change. Positive for unexpected weight change.  HENT:  Negative for hearing loss, ear pain, congestion, neck stiffness and postnasal drip.   Eyes: Negative for pain, discharge and visual disturbance.  Respiratory: Negative for chest  tightness and wheezing.   Cardiovascular: Negative for chest pain and palpitations.       [No decreased exercise tolerance Gastrointestinal: Increased diarrhea, increased IBS symptoms, increased abdominal pain and eructation Genitourinary: Negative for urgency, frequency, flank pain and difficulty urinating.  Musculoskeletal:Positive for myalgias, arthralgias. Negative for gait problem.  Neurological: Negative for dizziness, tremors, weakness and headaches.  Hematological: Negative for adenopathy.  Psychiatric/Behavioral: Positive for behavioral problems and dysphoric mood.       Objective:   Physical Exam Constitutiona - vitals reviewed and normal HEENT - C&S clear Pul- no increased work of breathing, no  wheezing Cor- RRR, normal DP/PT and radial pulses Ext - no deformity, no visible muscle wasting Neuro- A&O x 3, CN II-XII normal, able to stand without assist, normal ambulation.    Lab results: thyroid functions normal, Cmet normal. B12 and Vit d pending     Assessment & Plan:  1. Abnormal weight loss along with weakness and alopecia. Physical grossly normal. Will r/o metabolic issues. Suspect psychiatric issues play a big role in her symptoms.  Plan - thyroid labs, B12, CBC, CMet.  2. IBS - patient with alteration in bowel habit along with weight loss. She is to have EGD and colonoscopy and she is encouraged to have this done ASAP  3. Depression - this is a chronic problem for which she takes many medications. She is encouraged to continue with counselling and to keep appointment with Dr. Sabra Heck. She is provided with refills of concerta for ADD at today's visit.  4. Hypertension - well controlled

## 2010-11-06 NOTE — Consult Note (Signed)
NAMEALEXANDREA, [name] NO.:  [id number]   MEDICAL RECORD NO.:  [id number]          PATIENT TYPE:  INP   LOCATION:  3707                         FACILITY:  [facility]   PHYSICIAN:  Felizardo Hoffmann, M.D.  DATE OF BIRTH:  08-29-1945   DATE OF CONSULTATION:  01/15/2008  DATE OF DISCHARGE:                                 CONSULTATION   REASON FOR CONSULTATION:  Depression and anxiety.   REQUESTING PHYSICIAN:  Valerie A. Asa Lente, M.D.   HISTORY OF PRESENT ILLNESS:  [name] is a 65 year old female  admitted to the [facility] on January 14, 2008, for chest pain.   [name] states that her worry has increased over the past 4 weeks.  She has developed worsening feeling on edge and muscle tension as well  as insomnia to the point of catastrophic anxiety last night and suicidal  thought.  She no longer has suicidal thoughts now that she has received  some relief for her anxiety.  Her Xanax dosage today was able to control  her feeling on edge as well as her muscle tension.  She has been able to  regain control of her rational thoughts.   She is not having any thoughts of harming others.  She has no  hallucinations or delusions.  She does have hope.  She has constructive  interests and future goals.  Her energy is decreased.  Her concentration  is mildly decreased.  She points out also that her interests are not  normal even though she is interested in her children.  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   She has intact orientation and memory function.  She is not agitated or  combative.  She is cooperative with her healthcare.   The patient continues on her maintenance antidepression medication which  includes Wellbutrin 300 mg daily and her Prozac 20 mg daily.   PAST PSYCHIATRIC HISTORY:  The patient has no history of suicide  attempt.  She has no history of elevated energy or decreased  need for  sleep.   She has not been in a psychiatric hospital.   She has developed several week period of depressed mood, low energy,  poor concentration, anhedonia and suicidal thoughts.   She was initially treated for depression back in the 1980s.  She has  been on Elavil before as well as lithium.  She has also been on  venlafaxine.  She has been treated with Celexa as well.  She states that  Prozac in combination with Wellbutrin was successful in antidepression.  She notes that when her mother died in 09-29-2006 she had another  recurrence of depression but was doing much better with her depression  until approximately 4 weeks ago when the anxiety began to escalate.   She has sided as mounting worries her father who lives alone and worry  over her children.   The patient states that she was placed on Abilify, but it was stopped.  She has been restarted on it 2 mg  q.a.m. acutely.  She states that it  was stopped in the fall.   In review of the past medical record, the patient has listed in July  2007, Prozac 20 mg daily, Concerta, Ativan 1 mg every night p.r.n., and  Wellbutrin 300 mg daily.   The patient describes lifelong difficulty with attention and  distractibility which has been successfully treated with Concerta 36 mg  daily.   FAMILY PSYCHIATRIC HISTORY:  One of the patient's daughters has been  treated with Xanax for anxiety.   SOCIAL HISTORY:  [name] is married.  She is mostly retired from  Community education officer.  She has 2 daughters and a son.  Her mother passed away  in 27-Nov-2006.  The patient lives with her husband.  She denies illegal  drugs.  She has continued to drink alcohol rarely without complications.   PAST MEDICAL HISTORY:  1. Chest pain, rule out MI acutely.  The patient is staying overnight      for 3 enzyme panels  2. History of laparoscopic cholecystectomy.  3. The patient has a history of back surgery, breast reduction surgery      and  bilateral rotator cuff repair.   MEDICATIONS:  MAR is reviewed.  The patient is on:  1. Synthroid 100 mcg daily.  2. Concerta 36 mg daily.  3. Xanax 0.25 mg b.i.d. p.r.n.  4. Ativan 1 mg every night p.r.n.  5. Ambien 10 mg every night p.r.n. insomnia.  6. Abilify 2 mg q.a.m.  7. Wellbutrin 300 mg q.a.m.  8. Prozac 20 mg q.a.m.  9. Neurontin 100 mg t.i.d.   ALLERGIES:  MORPHINE SULFATE.   LABORATORY DATA:  WBC 7.5, hemoglobin 12, platelet count 209.  Sodium  142, BUN 19, creatinine 1.3, glucose 97, potassium 3.9.  INR within  normal limits.   REVIEW OF SYSTEMS:  Constitutional, head, eyes, ears, nose and throat,  mouth, neurologic, psychiatric, cardiovascular, respiratory,  gastrointestinal, genitourinary, skin, musculoskeletal, hematologic,  lymphatic, endocrine, metabolic all unremarkable.   EXAMINATION:  VITAL SIGNS:  Temperature 98, pulse 70, respiratory rate  20, blood pressure 135/86, O2 saturation on room air 97%.  GENERAL APPEARANCE:  [name] is a middle-aged female lying in a  supine position in her hospital bed partially reclined with no abnormal  involuntary movements.   MENTAL STATUS EXAM:  [name] is alert.  Her eye contact is good.  She is oriented completely to the year, month, day of the month, day of  the week, place and person.  She requests that her son sit in on most of  the session for facilitating social support and education.  Her  concentration is within normal limits.  Her attention span is normal.  She has slight fidgeting upper feet but otherwise no fidgeting.  Her  mood is slightly anxious.  Her affect is slightly anxious.  However, she  does have a broad and appropriate range of affect and mood as the  interview progresses.  Her memory is intact to immediate, recent and  remote.  Fund of knowledge and intelligence within normal limits.  Speech involves normal rate and prosody without dysarthria.  Thought  process is logical, coherent,  goal-directed.  No looseness of  associations.  Thought content:  No thoughts of harming herself, no  thoughts of harming others, no delusions, no hallucinations.  Insight is  intact.  Judgment is intact.   ASSESSMENT:  AXIS I:  293.84 anxiety disorder not otherwise specified,  rule out generalized  anxiety disorder, rule out panic disorder.  296.33 major depressive disorder.  Attention deficit hyperactivity disorder, stable.  AXIS II:  Deferred.  AXIS III:  See past medical history.  AXIS IV:  Primary support group.  AXIS V:  55.   [name] is no longer at risk to harm herself after recovering from  her acute severe anxiety state.  She does agree to call emergency  services immediately for any thoughts of harming herself, any thoughts  of harming others or distress.   Given the patient's history of outpatient psychiatric treatment and her  severe symptoms last night, the undersigned did recommend inpatient  psychiatric care for further evaluation and treatment.  However, the  patient is no longer committable after recovering from her acute  symptoms.   The patient opts for intensive outpatient treatment.   The undersigned provided ego supportive psychotherapy and education.   The indications, alternatives and adverse effects Concerta, Xanax,  Ambien, Wellbutrin, Prozac and Neurontin were reviewed with the patient  including the risk of dependence with the relevant medications above.  The patient understands and would like to proceed as below.   She agrees to not drive if drowsy.   The undersigned provided extensive introduction to cognitive behavioral  therapy providing examples cognitive confrontation as well as  recommending The Feeling Good Book.  In addition to The Feeling Good  Book, the undersigned recommended that she enter cognitive behavioral  therapy with deep breathing and progressive muscle relaxation as an  outpatient once she has finished the intensive  outpatient program.   RECOMMENDATIONS:  1. An intensive outpatient program of the patient's choice starting on      July 27.  The undersigned has asked the case manager to help the      patient arrange this.  Programs are available at [facility], [place], [place].  2. Please see the above.  3. Would discontinue the Ativan.  Utilize Xanax 0.25 to 1 mg p.o.      t.i.d. p.r.n. anxiety.  The patient agrees to not drive if drowsy.  4. Would discontinue the Abilify as long as the patient's catastrophic      anxiety remains absent.  5. Will not change any of the other long-term medications.  Will defer      these psychotropic changes to her      intensive outpatient program given the effect of change can take      several days and a number of weeks.  6. The critical acute medication support has been obtained by      utilizing the Xanax.  However, the long-term goal will be to      eventually eliminate benzodiazepine use.      Felizardo Hoffmann, M.D.  Electronically Signed     JW/MEDQ  D:  01/15/2008  T:  01/15/2008  Job:  [id number]

## 2010-11-06 NOTE — Op Note (Signed)
[name], [name] NO.:  [id number]   MEDICAL RECORD NO.:  [id number]          PATIENT TYPE:  INP   LOCATION:  5039                         FACILITY:  [facility]   PHYSICIAN:  Doran Heater. Veverly Fells, M.D. DATE OF BIRTH:  01/07/1946   DATE OF PROCEDURE:  11/25/2008  DATE OF DISCHARGE:                               OPERATIVE REPORT   PREOPERATIVE DIAGNOSES:  1. Right shoulder rotator cuff tear.  2. Arthropathy.   POSTOPERATIVE DIAGNOSES:  1. Right shoulder rotator cuff tear.  2. Arthropathy.   PROCEDURE PERFORMED:  Right shoulder, reversed total shoulder  arthroplasty using DePuy Delta Xtend prosthesis.   ATTENDING SURGEON:  Doran Heater. Veverly Fells, MD   ASSISTANT:  Abbott Pao. Dixon, PA-C   General anesthesia plus interscalene block anesthesia was used.   ESTIMATED BLOOD LOSS:  200 mL.   FLUID REPLACEMENT:  2000 mL of crystalloid, 500 mL Hextend.   URINE OUTPUT:  900 mL.   INSTRUMENT COUNT:  Correct.   COMPLICATIONS:  None.   Perioperative antibiotics were given.   INDICATIONS:  The patient is a 65 year old female with worsening pain in  the right shoulder with a rotator cuff-deficient shoulder.  The patient  has severe arthritis in the shoulder secondary to her rotator cuff-  deficiency.  She also has significant subscap weakness and poor  function.  Due to the patient's persistent pain and many years of  conservative management and poor function with the shoulder, the patient  elected to proceed with surgical management to increase function and  eliminate pain.  Informed consent was obtained.   DESCRIPTION OF THE PROCEDURE:  After an adequate level of anesthesia was  achieved, the patient was positioned in a modified beach chair position.  Right shoulder was examined under anesthesia.  Passively, the patient's  shoulder was fairly stiff, forward elevation up to about 70-80 degrees,  external rotation is 20, internal rotation with her hand on her abdomen.  After examining her shoulder under anesthesia, we sterilely prepped and  draped the shoulder and arm in the usual manner.  Deltopectoral approach  was utilized.  The patient had a prior Bankart reconstruction with a  deltopectoral approach.  This approach was somewhat difficult,  identifying the appropriate point between the deltoid and the  pectoralis, this was identified, quite a bit of scar tissue was found in  this interval as well as the subcoracoid and the conjoint tendon  interval, but we were able to identify and mobilize the conjoint tendon  off the underlying soft tissue which is basically a scar and a little  bit of subscap and not really significant thickness to the subscap  muscle.  It is more of just a big scar tissue plane.  We were careful  about protecting the musculocutaneous and axillary nerves as we  dissected and removed a lot of soft tissue from this anteroinferior  portion of the shoulder area.  We also mobilized the deltoid off the  humerus.  It was completely scarred down all the way round to the back  of the humerus with the soft tissue/remnant of the subscap released  off  biceps groove in the lesser tuberosity.  We progressively externally  rotated the humerus, releasing the soft tissue from the humeral shaft.  Then, we were able to deliver the humerus up into the wound.  We then  went ahead and set our humeral resection, after reaming through the top  of the humerus just posterior to the biceps groove.  We reamed up to a  size 10 and got good chatter.  At this point, we went ahead and placed  our neck resection guide in place, which is in the medullary and we set  it for about 10-15 degrees of retroversion and then we went ahead and  made a cut with an oscillating saw.  We placed a humeral protector in  place, a flat metal plate.  We then progressively released soft tissue  including a complete release of the glenoid labrum and removal of  glenoid labrum and  removal of inferior capsule.  We were careful to  again protect the axillary nerve, which was palpated during this  procedure.  We placed retractors posterior to the glenoid, so we could  get good view of the glenoid and removed the remaining cartilage which  was not much, with a Cobb elevator, identified the inferior scapular  neck and also the coracoid process and marked 12 o'clock, 6 o'clock, 3  and 9 positions.  We placed our centering guide pin.  We then reamed  down the sclerified subchondral bone, but did not violate the  subchondral plate.  Next, we went ahead and verified that we could get  our glenosphere flush with a trial.  We then removed the excess superior  bone with a hand reamer and then again trialed with the plastic  glenosphere to make sure we could get that flushed all the way down.  We  selected our glenosphere for the Delta Xtend prosthesis and then packed  it down into place, placing our superior and inferior holes  appropriately for the best bone possible.  We then placed inferior and  superior screws both 42 locking screws and then the anterior screw was a  36 locking, posterior screw was an 18 nonlocking.  Once we got all the  screws as tight as we can get them with excellent bone purchase, we went  ahead and locked them with the internal set screw.  With this performed,  we placed our 38 eccentric trial glenosphere in place and then went  ahead and brought our humerus back out into the wound and then went  ahead and reamed out a metaphyseal bone, this was a size 1 eccentric and  reamed out the metaphyseal bone for the humeral stem.  We noted there to  be best fit with the modular trial set at 0.  We had already cut for 20  degrees of retroversion.  This gave [place] the best proximal fit.  We  impacted that trail in place, placed a trial 3 poly spacer in place and  then reduced the shoulder.  We had a nice little pop, good range of  motion, nice stability.  We  actually felt like this 3 would be perfect.  We went ahead then and placed our real HA-coated stem in place.  We  built it on the back table and was set at 0 again and then impacted that  into place.  We removed our trial glenosphere and inserted our real  glenosphere, which is a 38 eccentric and screwed that and tapped it  and  screwed it into place to fully seat the glenosphere, felt like we had  good purchase on that.  Once that was done, we went ahead and re-trailed  the 3 and then we were actually able to get a 6 to fit and popped in  nicely and tightened with good tension on the conjoint tendon absolutely  no shuck at all and no gapping with internal or external rotation.  We  then selected the real 6 poly spacer, inserted that into place and then  once we impacted that, we reduced the shoulder and again we were happy  with the soft tissue tension and balance.  We checked again for any  extraneous bone on the medial aspect of proximal humerus, also any soft  tissue in the  anteroinferior portion of the shoulder, none was noted.  We thoroughly  irrigated and closed with deltopectoral closure with 0 Vicryl suture  followed by 2-0 Vicryl subcutaneous closure and 4-0 Monocryl for skin.  Steri-Strips applied followed by a sterile dressing.  The patient  tolerated the surgery well.      Doran Heater. Veverly Fells, M.D.  Electronically Signed     SRN/MEDQ  D:  11/25/2008  T:  11/26/2008  Job:  [id number]

## 2010-11-06 NOTE — Discharge Summary (Signed)
NAMERINDY, KOLLMAN NO.:  [id number]   MEDICAL RECORD NO.:  [id number]          PATIENT TYPE:  INP   LOCATION:  3707                         FACILITY:  [facility]   PHYSICIAN:  Evie Lacks. Plotnikov, MDDATE OF BIRTH:  05/04/1946   DATE OF ADMISSION:  01/14/2008  DATE OF DISCHARGE:  01/16/2008                               DISCHARGE SUMMARY   DISCHARGE DIAGNOSES:  1. Chest pain, atypical myocardial infarction ruled out.  2. Severe depression.  3. Anxiety.  4. Gastroesophageal reflux disease.  5. Attention deficit disorder.  6. Hypothyroidism.   CONSULTATION:  Felizardo Hoffmann, MD, Psychiatry.   FOLLOWUP PLANS:  1. Dr. Rhona Raider recommended intensive outpatient behavioral treatment      program for depression.  She will start next Monday.  2. Dr. Linda Hedges next week.   DISCHARGE MEDICATIONS:  1. Xanax (alprazolam) 0.5 mg one-half-one p.o. t.i.d. p.r.n., the      prescription was called in [employer].  2. Resume other home medicines.  Do not take Abilify.   SPECIAL INSTRUCTIONS:  Call if problems.   HISTORY:  The patient is a 65 year old female who was admitted through  ED with a complaint of chest tightness and associated pressure and  numbness in both shoulders.  She reported to be very depressed over past  several weeks.  For the details, please address to our history and  physical on January 15, 2008.   HOSPITAL COURSE:  During the course of hospitalization, the patient was  monitored.  The series of cardiac enzymes was negative.  CK-MB was  elevated a couple of times mildly with no associated troponin or  myoglobin increase.  BMET was normal.  TSH pending.  Chest x-ray normal.  EKG without acute changes, some with prolonged QT.  Cholesterol 199, LDL  134, and INR 1.0.  On the day of discharge, she is feeling well.  No  chest pain.   PHYSICAL EXAMINATION:  VITAL SIGNS:  Blood pressure 94/56, heart rate  60, respirations 18, temp 97.3, and sats  94% on room air.  GENERAL:  Looks well.  HEENT:  Moist mucosa.  LUNGS:  Clear.  HEART:  Regular.  ABDOMEN:  Soft and nontender.  LOWER EXTREMITIES:  Without edema.  NEUROLOGIC:  She is alert, oriented, cooperative, depressed, and not  suicidal.      Evie Lacks. Plotnikov, MD  Electronically Signed     AVP/MEDQ  D:  01/16/2008  T:  01/16/2008  Job:  [id number]   cc:   Heinz Knuckles. Norins, MD

## 2010-11-07 LAB — VITAMIN D 1,25 DIHYDROXY
Vitamin D 1, 25 (OH)2 Total: 67 pg/mL (ref 18–72)
Vitamin D3 1, 25 (OH)2: 67 pg/mL

## 2010-11-09 NOTE — Assessment & Plan Note (Signed)
[facility]                             PRIMARY CARE OFFICE NOTE   NAME:[name], [name]                     MRN:          [id number]  DATE:01/06/2006                            DOB:          07-17-1945    IDENTIFICATION:  [name] is followed for hypothyroid disease.  She also  has a history of asthma, anxiety, sinus difficulties.  She has had breast  surgery and shoulder surgery in the past.  The patient was last seen in the  office by Dr. Lyla [name] January 21, 2005.  Last primary care visit December 21, 2004.   PAST MEDICAL HISTORY:  Medical illnesses:  1.  Usual childhood diseases.  2.  Hypothyroid disease.  3.  Asthma.  4.  Mitral valve prolapse.  5.  Degenerative arthritis.  6.  Dyspepsia with reflux.  7.  Irritable bowel syndrome.  8.  History of intermittent chronic anxiety and depression.  9.  Attention deficit disorder.   PAST SURGICAL HISTORY:  1.  Back surgery in 1997.  2.  Breast reduction surgery in 1997.  3.  Rotator cuff repair, left 1998, right 1996 and 1997.  4.  Laparoscopic cholecystectomy in October 2006.   GYN HISTORY:  The patient is gravida 3, para 3 with two daughter's and a  [name].   SOCIAL HISTORY:  The patient has been married for many years.  Her husband  is currently retired and worked as a Mining engineer.  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   CURRENT MEDICATIONS:  1.  Synthroid 100 mcg daily.  2.  Ketoprofen 200 mg daily.  3.  Concerta 36 mg daily.  4.  Prozac 20 mg daily.  5.  Hydrochlorothiazide 12.5 mg daily.  6.  Wellbutrin XL 300 mg daily.  7.  Protonix 40 mg b.i.d.  8.  Neurontin 300 mg t.i.d.  9.  Ambien 10 mg q.h.s. p.r.n.  10. Dicyclomine 10 mg q.8h. p.r.n.  11. Lorazepam 1 mg q.h.s. p.r.n.   HEALTH MAINTENANCE AND CHART REVIEW:  The patient reports she had pelvic and  Pap smear July 2007 with Dr. Elveria [name].  Last mammogram was July 2007  and by her report was  normal.  Her last laboratory was January 21, 2005 with  Dr. Lyla [name] which revealed a normal CBC, normal comprehensive metabolic  panel.  Last thyroid function was Oct 28, 2003 with normal free T4 and TSH.  Normal sed rate.  Last gallbladder ultrasound performed January 24, 2005 which  showed a well dilated gallbladder with multiple mobile and shadowing stones  and pericholecystic fluid.  Last colonoscopy February 19, 2005 with  diverticulosis.  Patient did have a laparoscopic cholecystectomy in October  of 2006.   REASON FOR VISIT:  The patient presents today to transfer prescribing  authority for her psychiatric med's.  She had been seeing a psychiatrist,  Dr. Claiborne [name] in [facility], who is going out of practice.   I discussed with the patient my policy on controlled substance prescribing  including the need for single pharmacy, single prescriber, managing to  get  prescriptions on a timely basis to avoid after-hour calls, weekend calls for  refills and the need for possible random drug screening at my discretion.  I  explained that to break any of these rules would result in cessation of  prescribing, although not cessation of care.   With the clear understanding of the patient's medical history, the fact that  she is current and up to date with health maintenance, I did go ahead and  prescribe for her Concerta 36  mg daily F12 days with fill on or after  prescriptions for both February 06, 2006 and March 09, 2006.  Also  prescribed her Prozac, Wellbutrin and Neurontin.   The patient is stable and doing well.  She is asked to return to see me in  three months for routine followup and renewal of prescriptions.                                   [name], MD   MEN/MedQ  DD:  01/07/2006  DT:  01/07/2006  Job #:  [id number]   cc:   Patient

## 2010-11-09 NOTE — H&P (Signed)
[name], [name] NO.:  [id number]   MEDICAL RECORD NO.:  [id number]          PATIENT TYPE:  INP   LOCATION:                               FACILITY:  [facility]   PHYSICIAN:  Doran Heater. Veverly Fells, M.D. DATE OF BIRTH:  01/08/1946   DATE OF ADMISSION:  11/18/2008  DATE OF DISCHARGE:                              HISTORY & PHYSICAL   CHIEF COMPLAINTS:  Right shoulder pain.   HISTORY OF PRESENT ILLNESS:  The patient is a 65 year old female with  worsening right shoulder pain.  The patient has a known rotator cuff  deficient shoulder along with osteoarthritis.  The patient has elected  to have a reverse total shoulder arthroplasty by Dr. Esmond Plants.   PAST MEDICAL HISTORY:  1. Hypertension.  2. GERD.  3. Hyperlipidemia.  4. Anxiety.  5. Hypothyroidism.  6. Fibromyalgia.   FAMILY MEDICAL HISTORY:  COPD.   SOCIAL HISTORY:  The patient of Dr. Veverly Fells.  Does not smoke.  Occasional  alcohol use.   DRUG ALLERGIES:  AMOXICILLIN.   CURRENT MEDICATIONS:  1. Synthroid 0.1 mcg daily.  2. Pantoprazole 40 mg p.o. daily.  3. Hydrochlorothiazide, unknown dose.  4. Neurontin 300 mg t.i.d.  5. Ketoprofen 200 mg t.i.d. p.r.n.  6. Levsin 0.125 mg t.i.d. p.r.n.  7. Benicar 20/12.5 mg daily.  8. Bupropion 300 mg daily.  9. Concerta 36 mg daily.  10.Paroxetine 20 mg daily.  11.Ambien 10 mg nightly.  12.Zocor 20 mg daily.  13.Lamotrigine 50 mg daily.   REVIEW OF SYSTEMS:  None.  Occasional bouts of IBS with some abdomen  pain and pain with range of motion of right upper extremity.   PHYSICAL EXAMINATION:  VITAL SIGNS:  Pulse 60, respirations 16, blood  pressure 110/60.  GENERAL:  The patient is healthy-appearing 65 year old female, in no  acute distress.  PSYCHIATRIC:  Pleasant mood and affect, alert and oriented x3.  HEAD AND NECK:  Cranial nerves II through XII grossly intact.  Neck  shows full range of motion without tenderness.  CHEST:  Active breath sounds  bilaterally.  No wheezes, rhonchi, or  rales.  HEART:  Regular rate and rhythm.  No murmur.  ABDOMEN:  Nontender and nondistended with active bowel sounds.  EXTREMITIES:  Forward flexion of the right upper extremity of 90  degrees, external rotation of 20 degrees.  Internal rotation of abdomen  does have some moderate tenderness and decreased strength.  Capillary  refill was less 2 seconds.  She has no rashes or edema.   X-rays show right shoulder osteoarthritis, end stage.   IMPRESSION:  End-stage osteoarthritis, right shoulder.   PLAN OF ACTION:  Right total shoulder arthroplasty, reverse by Dr.  Esmond Plants.      Thomas B. Doren Custard, P.A.      Doran Heater. Veverly Fells, M.D.  Electronically Signed    TBD/MEDQ  D:  11/08/2008  T:  11/09/2008  Job:  [id number]

## 2010-11-09 NOTE — Op Note (Signed)
NAMECOLBY, CATANESE NO.:  [id number]   MEDICAL RECORD NO.:  [id number]          PATIENT TYPE:  EMS   LOCATION:  ED                           FACILITY:  [facility]   PHYSICIAN:  Odis Hollingshead, M.D.DATE OF BIRTH:  08-Nov-1945   DATE OF PROCEDURE:  02/26/2005  DATE OF DISCHARGE:  02/23/2005                                 OPERATIVE REPORT   PREOPERATIVE DIAGNOSIS:  Symptomatic cholelithiasis.   POSTOPERATIVE DIAGNOSIS:  Acute cholecystitis with cholelithiasis.   PROCEDURE:  Laparoscopic cholecystectomy with intraoperative.   SURGEON:  Odis Hollingshead.   ASSISTANT:  Georgina Quint, M.D.   ANESTHESIA:  General.   INDICATIONS:  Ms. Newhard is a 65 year old female who has developed  pressure-type epigastric pain with some pains in the subscapular region,  increasing nausea and bloating. She does take medicine for gastroesophageal  reflux disease but continues to have these symptoms and was found to have  cholelithiasis on ultrasound with normal ductal diameters. She now presents  for elective laparoscopic cholecystectomy.  She was seen in the [facility] by one  of my associates over the weekend due to an acute gallbladder attack which  improved with IV Dilaudid. The procedure and the risks have been discussed  with her preoperatively.   TECHNIQUE:  She is seen the holding area and brought to the operating room,  placed supine on the operating table. General anesthetic was administered.  The abdominal wall was sterilely prepped and draped. Dilute Marcaine  solution was infiltrated in the infraumbilical region and an infraumbilical  incision was made through the skin, subcutaneous tissue until the fascia was  identified. A small incision was made in the midline fascia. The peritoneal  cavity was entered. A pursestring suture of #0 Vicryl was placed around the  fascial edges. A Hassan trocar was introduced into the peritoneal cavity and  a pneumoperitoneum was  created by insufflation of CO2 gas.   Next the laparoscope was introduced. She was placed in the reverse  Trendelenburg position with the right side tilted slightly upward. An 11 mm  trocar was placed in the epigastric incision and two 5 mm trocars placed in  the right mid lateral abdomen. Exposure of the gallbladder demonstrates some  acute inflammatory type changes with hyperemia, injection and erythema. The  fundus was grasped and retracted toward the right shoulder. By using blunt  dissection, I mobilized the infundibulum. The infundibulum was then  retracted laterally. Using blunt dissection and staying close to the  gallbladder, I isolated the cystic duct and created a window around it. A  clip was placed at the cystic duct gallbladder junction. A small incision  was made in the cystic duct. Bile was milked back. A cholangiocatheter was  passed through the anterior abdominal wall and placed in the cystic duct and  a cholangiogram was performed.   Under real time fluoroscopy dilute contrast material was injected to the  cystic duct which was of moderate length. The common hepatic, right and left  hepatic and common bile ducts all filled promptly and contrast drained  promptly into the duodenum without obvious evidence  of obstruction. Final  reports pending radiologist interpretation.   The cholangiocatheter was removed, the cystic duct was clipped three times  proximally and divided. I then isolated the cystic artery and created a  window around it. It was clipped and divided. Following this electrocautery  was used to dissect the gallbladder free from the liver bed. Acute edematous  changes and inflammatory changes were noted. The gallbladder was then placed  in an Endopouch bag.   The gallbladder fossa was irrigated. Bleeding points controlled with  cautery. It was irrigated multiple times. No bile leak or bleeding was  noted. The irrigation fluid was evacuated as much as  possible.   The gallbladder was removed through the subumbilical port and the  subumbilical fascial defect was closed by tightening up and tying down the  pursestring suture under laparoscopic vision. The remaining trocars were  removed and pneumoperitoneum was released. The skin incisions were closed  with 4-0 Monocryl subcuticular stitches followed by Steri-Strips and sterile  dressings.   She tolerated the procedure well without any apparent complications and she  was taken to the recovery room in satisfactory condition.      Odis Hollingshead, M.D.  Electronically Signed     TJR/MEDQ  D:  02/26/2005  T:  02/26/2005  Job:  [id number]   cc:   Clarene Reamer, M.D. [facility]   Heinz Knuckles. Norins, M.D. [facility]  [address]  [place] [address]

## 2010-11-13 ENCOUNTER — Encounter: Payer: BC Managed Care – PPO | Admitting: Internal Medicine

## 2010-11-13 ENCOUNTER — Telehealth: Payer: Self-pay | Admitting: *Deleted

## 2010-11-13 NOTE — Telephone Encounter (Signed)
1. Req RF of ambien 10 mg 2. Req results of labs 3. Has MRI/MRA been ordered/scheduled?

## 2010-11-13 NOTE — Telephone Encounter (Signed)
Labs were all normal. Letter forth coming. MRI, MRA for ataxia was never rescheduled. Can do if she is still having trouble with gait and balance.

## 2010-11-15 ENCOUNTER — Ambulatory Visit (INDEPENDENT_AMBULATORY_CARE_PROVIDER_SITE_OTHER): Payer: BC Managed Care – PPO | Admitting: Psychology

## 2010-11-15 DIAGNOSIS — F331 Major depressive disorder, recurrent, moderate: Secondary | ICD-10-CM

## 2010-11-16 ENCOUNTER — Encounter: Payer: Self-pay | Admitting: Internal Medicine

## 2010-11-16 MED ORDER — ZOLPIDEM TARTRATE ER 12.5 MG PO TBCR: 12.5000 mg | EXTENDED_RELEASE_TABLET | Freq: Every evening | ORAL | Status: DC | PRN

## 2010-11-22 ENCOUNTER — Telehealth: Payer: Self-pay | Admitting: *Deleted

## 2010-11-22 NOTE — Telephone Encounter (Signed)
Pt called and said that she does not need OV--she was seen at dermatologist today.

## 2010-11-22 NOTE — Telephone Encounter (Signed)
Patient requesting to know if she needs OV for insect bite that is swollen and red. Left vm for pt to call and set up OV w/Norins for EVAL

## 2010-12-05 ENCOUNTER — Ambulatory Visit (AMBULATORY_SURGERY_CENTER): Payer: Medicare Other | Admitting: Internal Medicine

## 2010-12-05 ENCOUNTER — Encounter: Payer: Self-pay | Admitting: Internal Medicine

## 2010-12-05 DIAGNOSIS — K5289 Other specified noninfective gastroenteritis and colitis: Secondary | ICD-10-CM

## 2010-12-05 DIAGNOSIS — R197 Diarrhea, unspecified: Secondary | ICD-10-CM

## 2010-12-05 DIAGNOSIS — Z1211 Encounter for screening for malignant neoplasm of colon: Secondary | ICD-10-CM

## 2010-12-05 DIAGNOSIS — R1310 Dysphagia, unspecified: Secondary | ICD-10-CM

## 2010-12-05 DIAGNOSIS — Z800 Family history of malignant neoplasm of digestive organs: Secondary | ICD-10-CM

## 2010-12-05 DIAGNOSIS — K529 Noninfective gastroenteritis and colitis, unspecified: Secondary | ICD-10-CM

## 2010-12-05 DIAGNOSIS — K219 Gastro-esophageal reflux disease without esophagitis: Secondary | ICD-10-CM

## 2010-12-05 HISTORY — DX: Noninfective gastroenteritis and colitis, unspecified: K52.9

## 2010-12-05 MED ORDER — SODIUM CHLORIDE 0.9 % IV SOLN: 500.0000 mL | INTRAVENOUS | Status: DC

## 2010-12-05 NOTE — Patient Instructions (Signed)
Findings:  Colitis, Mild Diverticulosis, Normal EGD  Recommendations:  Follow up with Dr Marina Goodell in 2 Weeks.  Office will call with appointment.                                   Repeat colonoscopy in 5 years.                                   Dilated esophagus so needs to stay on clear liquids until 1:00 and then progress to soft diet rest of day.  Discharge instructions, teaching and diet explained and given to pt and care giver

## 2010-12-06 ENCOUNTER — Ambulatory Visit (INDEPENDENT_AMBULATORY_CARE_PROVIDER_SITE_OTHER): Payer: Medicare Other | Admitting: Psychology

## 2010-12-06 ENCOUNTER — Telehealth: Payer: Self-pay

## 2010-12-06 DIAGNOSIS — F331 Major depressive disorder, recurrent, moderate: Secondary | ICD-10-CM

## 2010-12-06 NOTE — Telephone Encounter (Signed)
Follow up Call- Patient questions:  Do you have a fever, pain , or abdominal swelling? no Pain Score  0 *  Have you tolerated food without any problems? yes  Have you been able to return to your normal activities? yes  Do you have any questions about your discharge instructions: Diet   no Medications  no Follow up visit  no  Do you have questions or concerns about your Care? no  Actions: * If pain score is 4 or above: No action needed, pain <4. Per pt "I'm still a little groggy this morning, however I'm on my way to an appoint this am."  No other complaints or sx noted.  I advised the pt to call if grogginess does not improve.MAW

## 2010-12-10 ENCOUNTER — Other Ambulatory Visit: Payer: Self-pay | Admitting: Internal Medicine

## 2010-12-11 ENCOUNTER — Telehealth: Payer: Self-pay | Admitting: Internal Medicine

## 2010-12-11 NOTE — Telephone Encounter (Signed)
Left message for pt to call back  °

## 2010-12-12 ENCOUNTER — Other Ambulatory Visit: Payer: Self-pay | Admitting: Dermatology

## 2010-12-13 ENCOUNTER — Ambulatory Visit (INDEPENDENT_AMBULATORY_CARE_PROVIDER_SITE_OTHER): Payer: Medicare Other | Admitting: Psychology

## 2010-12-13 DIAGNOSIS — F331 Major depressive disorder, recurrent, moderate: Secondary | ICD-10-CM

## 2010-12-13 NOTE — Telephone Encounter (Signed)
Pt states that he daughter is coming in the week of July 4th and she wanted to reschedule her f/u ov. Rescheduled her to see Dr. Marina Goodell 12/14/10 @2 :30pm. Pt aware of appt date and time.

## 2010-12-14 ENCOUNTER — Other Ambulatory Visit (INDEPENDENT_AMBULATORY_CARE_PROVIDER_SITE_OTHER): Payer: Medicare Other

## 2010-12-14 ENCOUNTER — Encounter: Payer: Self-pay | Admitting: Internal Medicine

## 2010-12-14 ENCOUNTER — Ambulatory Visit (INDEPENDENT_AMBULATORY_CARE_PROVIDER_SITE_OTHER): Payer: Medicare Other | Admitting: Internal Medicine

## 2010-12-14 VITALS — BP 134/70 | HR 80 | Ht 66.0 in | Wt 148.4 lb

## 2010-12-14 DIAGNOSIS — K5100 Ulcerative (chronic) pancolitis without complications: Secondary | ICD-10-CM

## 2010-12-14 DIAGNOSIS — R1310 Dysphagia, unspecified: Secondary | ICD-10-CM

## 2010-12-14 DIAGNOSIS — Z86010 Personal history of colonic polyps: Secondary | ICD-10-CM

## 2010-12-14 DIAGNOSIS — K219 Gastro-esophageal reflux disease without esophagitis: Secondary | ICD-10-CM

## 2010-12-14 DIAGNOSIS — R634 Abnormal weight loss: Secondary | ICD-10-CM

## 2010-12-14 LAB — CBC WITH DIFFERENTIAL/PLATELET
Basophils Absolute: 0.1 10*3/uL (ref 0.0–0.1)
Hemoglobin: 13.1 g/dL (ref 12.0–15.0)
Lymphocytes Relative: 15.8 % (ref 12.0–46.0)
Monocytes Relative: 11 % (ref 3.0–12.0)
Neutro Abs: 5.6 10*3/uL (ref 1.4–7.7)
Neutrophils Relative %: 66.1 % (ref 43.0–77.0)
RDW: 14.9 % — ABNORMAL HIGH (ref 11.5–14.6)

## 2010-12-14 LAB — SEDIMENTATION RATE: Sed Rate: 10 mm/hr (ref 0–22)

## 2010-12-14 MED ORDER — MESALAMINE 1.2 G PO TBEC
DELAYED_RELEASE_TABLET | ORAL | Status: DC
Start: 2010-12-14 — End: 2011-02-06

## 2010-12-14 NOTE — Patient Instructions (Signed)
Labs ordered for you to have drawn today.  Go to basement floor to lab. Lialda samples given and prescription sent to your pharmacy. Ulcerative Colitis brochure given for you to review.  Information packet on crohn's and colitis given also for you to read. Follow-up in 4 weeks with Dr. Henrene Pastor.

## 2010-12-14 NOTE — Progress Notes (Signed)
HISTORY OF PRESENT ILLNESS:  [name] is a 65 y.o. female with the below listed medical history who was followed long-term by Dr. Victorino Dike for irritable bowel syndrome and GERD. Also a history of adenomatous colon polyps for which she has undergone prior colonoscopies. I saw the patient, for the first time, 12/05/2010 for surveillance colonoscopy. Prior to the procedure, she had mentioned problems with worsening diarrhea and weight loss. Colonoscopy revealed mild diffuse colitis. The terminal ileum was normal. Incidental sigmoid diverticulosis. Biopsies from the colon revealed chronic active colitis consistent with inflammatory bowel disease. Upper endoscopy was also performed. To evaluate dysphagia. Endoscopy was normal. Empiric dilation of the esophagus with a 52 French dilator was performed. Because of the history of chronic diarrhea, duodenal biopsies were performed. These returned normal. The patient presents today for followup. Today, she seems to think that she has had worsening symptoms of diarrhea and abdominal discomfort for 6 months. This is lead to food avoidance and weight loss. She denies seeing blood or mucus. No upper GI complaints. Her dysphagia has resolved.  REVIEW OF SYSTEMS:  All non-GI ROS negative except for stress with anxiety, muscle cramps, insomnia, and increased thirst.  Past Medical History  Diagnosis Date  . Anxiety   . Depression   . Thyroid disease   . Allergy   . GERD (gastroesophageal reflux disease)   . IBS (irritable bowel syndrome)   . Ulcerative colitis     Past Surgical History  Procedure Date  . Cholecystectomy   . Colonoscopy   . Upper gastrointestinal endoscopy   . Back surgery     fluid removed from between disks  . Shoulder surgery     right shoulder 3x   . Shoulder replscement   . Toe surgery     right big toe  . Cystectomy     left under arm    Social History [name]  reports that she quit smoking about 23 years  ago. She has never used smokeless tobacco. She reports that she drinks about .5 ounces of alcohol per week. She reports that she does not use illicit drugs.  family history includes Arthritis in her father; Colon cancer (age of onset:96) in her father; Heart disease in her father; and Hypertension in her father.  There is no history of Diabetes.  Allergies  Allergen Reactions  . Amoxil (Amoxicillin Trihydrate) Hives       PHYSICAL EXAMINATION: Vital signs: BP 134/70  Pulse 80  Ht 5\' 6"  (1.676 m)  Wt 148 lb 6.4 oz (67.314 kg)  BMI 23.95 kg/m2 General: Well-developed, well-nourished, no acute distress Abdomen: Not reexamined Psychiatric: alert and oriented x3. Cooperative    ASSESSMENT:  #1. Mild universal ulcerative colitis. We discussed the pathophysiology, treatment, complications, and outcomes of the disease. #2. Weight loss. Presumably due to the same #3. Dysphagia without stricture status post empiric dilation. Symptoms improved #4. GERD. No active symptoms on PPI #5. Personal history of adenomatous colon polyps and family history of colon cancer. No neoplasia on recent exam   PLAN:  #1. Prescribed Lialda 2.4 g twice a day #2. CBC and C-reactive protein today #3. Literature of ulcerative colitis provided #4. Office followup in 4 weeks. Contact the office in the interim for questions or problems #5. Followup colonoscopy in 5 years for neoplasia surveillance #6. Continue reflux precautions and PPI for GERD

## 2010-12-20 ENCOUNTER — Ambulatory Visit (INDEPENDENT_AMBULATORY_CARE_PROVIDER_SITE_OTHER): Payer: Medicare Other | Admitting: Psychology

## 2010-12-20 DIAGNOSIS — F331 Major depressive disorder, recurrent, moderate: Secondary | ICD-10-CM

## 2010-12-25 ENCOUNTER — Ambulatory Visit: Payer: BC Managed Care – PPO | Admitting: Internal Medicine

## 2011-01-07 ENCOUNTER — Ambulatory Visit: Payer: Medicare Other | Admitting: Internal Medicine

## 2011-01-08 ENCOUNTER — Ambulatory Visit: Payer: Medicare Other | Admitting: Psychology

## 2011-01-08 ENCOUNTER — Other Ambulatory Visit: Payer: Self-pay | Admitting: Internal Medicine

## 2011-02-06 ENCOUNTER — Encounter: Payer: Self-pay | Admitting: Internal Medicine

## 2011-02-06 ENCOUNTER — Telehealth: Payer: Self-pay | Admitting: *Deleted

## 2011-02-06 ENCOUNTER — Ambulatory Visit (INDEPENDENT_AMBULATORY_CARE_PROVIDER_SITE_OTHER): Payer: Medicare Other | Admitting: Internal Medicine

## 2011-02-06 DIAGNOSIS — K5190 Ulcerative colitis, unspecified, without complications: Secondary | ICD-10-CM

## 2011-02-06 DIAGNOSIS — R197 Diarrhea, unspecified: Secondary | ICD-10-CM

## 2011-02-06 DIAGNOSIS — K219 Gastro-esophageal reflux disease without esophagitis: Secondary | ICD-10-CM

## 2011-02-06 MED ORDER — MESALAMINE 800 MG PO TBEC: 2.0000 | DELAYED_RELEASE_TABLET | Freq: Two times a day (BID) | ORAL | Status: DC

## 2011-02-06 MED ORDER — KETOPROFEN 75 MG PO CAPS: 75.0000 mg | ORAL_CAPSULE | Freq: Three times a day (TID) | ORAL | Status: AC | PRN

## 2011-02-06 NOTE — Patient Instructions (Signed)
Samples of Asacol HD take 2 twice daily  Rx. Also sent to pharmacy Follow-up with Dr. Marina Goodell in 2 months

## 2011-02-06 NOTE — Telephone Encounter (Signed)
Change to ketoprofen 75 mg tid, #90, refill 11. [facility] to [facility]

## 2011-02-06 NOTE — Telephone Encounter (Signed)
Pt came in today and filled out a walk in form. Her Ketapheron SR 200mg  is very expensive she states the SR comes in 75 mg too. She would like a prescription for Kerapheron 75 mg 1 tid prescribed to [facility]. Please Advise

## 2011-02-06 NOTE — Progress Notes (Signed)
HISTORY OF PRESENT ILLNESS:  [name] is a 65 y.o. female with a history of anxiety/depression, thyroid disease, GERD, IBS, and recently diagnosed ulcerative colitis on surveillance colonoscopy 12/05/2010. Preprocedure, she mentioned worsening diarrhea and weight loss. Colonoscopy revealed mild diffuse colitis. The terminal ileum was normal. Incidental diverticulosis present. Biopsies from the colon revealed chronic active colitis consistent with inflammatory bowel disease. Upper endoscopy with esophageal dilation was performed due to dysphagia. Examination was normal. Empiric esophageal dilation, which helped, was performed. Duodenal biopsies were negative. She was seen in the office in followup 12/14/2010. We discussed ulcerative colitis in detail. CBC and C-reactive protein were unremarkable. She was prescribed Lialda 2.4 g twice a day. She was to follow up in one month, but states that she forgot her appointment. She tells her that she feels about the same. She has not been taking Lialda as prescribed. She took the prescribed dosage for about 2 weeks then decrease to 1.2 g daily. She will not continue the medication do to cost. He continues with intermittent loose stool, bloating, and gas. Her weight has been stable. No new complaints  REVIEW OF SYSTEMS:  All non-GI ROS negative except for anxiety  Past Medical History  Diagnosis Date  . Anxiety   . Depression   . Thyroid disease   . Allergy   . GERD (gastroesophageal reflux disease)   . IBS (irritable bowel syndrome)   . Ulcerative colitis     Past Surgical History  Procedure Date  . Cholecystectomy   . Colonoscopy   . Upper gastrointestinal endoscopy   . Back surgery     fluid removed from between disks  . Shoulder surgery     right shoulder 3x   . Shoulder replscement   . Toe surgery     right big toe  . Cystectomy     left under arm    Social History MADLYNN LUNDEEN  reports that she quit smoking about 23 years  ago. She has never used smokeless tobacco. She reports that she drinks about .5 ounces of alcohol per week. She reports that she does not use illicit drugs.  family history includes Arthritis in her father; Colon cancer (age of onset:96) in her father; Heart disease in her father; and Hypertension in her father.  There is no history of Diabetes.  Allergies  Allergen Reactions  . Amoxil (Amoxicillin Trihydrate) Hives       PHYSICAL EXAMINATION: Vital signs: BP 142/70  Pulse 80  Ht 5\' 6"  (1.676 m)  Wt 147 lb 3.2 oz (66.769 kg)  BMI 23.76 kg/m2 General: Well-developed, well-nourished, no acute distress HEENT: Sclerae are anicteric, conjunctiva pink. Oral mucosa intact Lungs: Clear Heart: Regular Abdomen: soft, nontender, nondistended, no obvious ascites, no peritoneal signs, normal bowel sounds. No organomegaly. Extremities: No edema Psychiatric: alert and oriented x3. Cooperative    ASSESSMENT:  #1. Ulcerative colitis. Patient on an inadequate dose of mesalamine to be able to assess effectiveness. Cost concerns an issue. We discussed other therapies, and the pros and cons. She has had common tolerated, prednisone the past, she is not interested in prednisone therapy as she is concerned with the side effect profile. In terms of immunomodulators therapy/monitoring and biologic therapy, she rules out their use, currently, due to cost concerns. I stressed to her the importance of medical therapy. We could try Azulfidine, however the side effect profile is quite high. I stressed the potential for complications of untreated colitis including failure to thrive, protein loss, anemia,  and cancer. Also the risk for superimposed infection such as Clostridium difficile. For each point I made, she had a counter point as to why it would not work for her...   PLAN:  #1. A 30 day supply of Asacol HD 1.6 g twice a day obtained from our supply closet. An electronic prescription for the same submitted. A  company Clinical biochemist card provided. Hopefully, the therapy will help and she will find it reasonably affordable #2. Routine office followup in 2 months. Contact the office in the interim for any questions or problems.

## 2011-02-07 NOTE — Telephone Encounter (Signed)
Informed pt .

## 2011-02-26 ENCOUNTER — Other Ambulatory Visit (HOSPITAL_COMMUNITY): Payer: Self-pay | Admitting: Orthopedic Surgery

## 2011-02-26 DIAGNOSIS — M25511 Pain in right shoulder: Secondary | ICD-10-CM

## 2011-03-06 ENCOUNTER — Encounter (HOSPITAL_COMMUNITY)
Admission: RE | Admit: 2011-03-06 | Discharge: 2011-03-06 | Disposition: A | Discharge status: Home / Self Care | Payer: Medicare Other | Source: Ambulatory Visit | Attending: Orthopedic Surgery | Admitting: Orthopedic Surgery

## 2011-03-06 ENCOUNTER — Encounter (HOSPITAL_COMMUNITY): Payer: Self-pay

## 2011-03-06 DIAGNOSIS — Z96619 Presence of unspecified artificial shoulder joint: Secondary | ICD-10-CM | POA: Insufficient documentation

## 2011-03-06 DIAGNOSIS — M25519 Pain in unspecified shoulder: Secondary | ICD-10-CM | POA: Insufficient documentation

## 2011-03-06 DIAGNOSIS — M25511 Pain in right shoulder: Secondary | ICD-10-CM

## 2011-03-06 MED ORDER — TECHNETIUM TC 99M MEDRONATE IV KIT
23.9000 | PACK | Freq: Once | INTRAVENOUS | Status: AC | PRN
  Administered 2011-03-06: 23.9 via INTRAVENOUS

## 2011-03-08 ENCOUNTER — Telehealth: Payer: Self-pay | Admitting: *Deleted

## 2011-03-08 DIAGNOSIS — R279 Unspecified lack of coordination: Secondary | ICD-10-CM

## 2011-03-08 NOTE — Telephone Encounter (Signed)
Patient requesting order for MRI/MRA of brain that MD had discussed previously.

## 2011-03-08 NOTE — Telephone Encounter (Signed)
Reviewed previous office notes: August 30, 2010 seen for problem with ataxia: plan was MRI/MRA for persistent problems to r/o cerebellar or brainstem lesion and to r/o vertebro-basilar insufficiency. Orders were entered at that visit but she never followed through. OK to reactivate order (can we do that?) if she is still having ataxia.

## 2011-03-11 NOTE — Telephone Encounter (Signed)
Pt informed, orders put in and [facility] will work on appointment

## 2011-03-12 ENCOUNTER — Ambulatory Visit (INDEPENDENT_AMBULATORY_CARE_PROVIDER_SITE_OTHER): Payer: Medicare Other | Admitting: Psychology

## 2011-03-12 DIAGNOSIS — F331 Major depressive disorder, recurrent, moderate: Secondary | ICD-10-CM

## 2011-03-22 LAB — POCT CARDIAC MARKERS
CKMB, poc: 4.2
Myoglobin, poc: 146
Troponin i, poc: <0.05

## 2011-03-22 LAB — CARDIAC PANEL(CRET KIN+CKTOT+MB+TROPI)
CK, MB: 4.5 — ABNORMAL HIGH
Relative Index: INVALID
Relative Index: INVALID
Total CK: 126
Troponin I: <0.01
Troponin I: <0.01

## 2011-03-22 LAB — POCT I-STAT, CHEM 8
Chloride: 107
Creatinine, Ser: 1.3 — ABNORMAL HIGH
HCT: 35 — ABNORMAL LOW
Hemoglobin: 11.9 — ABNORMAL LOW
Potassium: 3.9
Sodium: 142

## 2011-03-22 LAB — BASIC METABOLIC PANEL
CO2: 27
Calcium: 9.2
Chloride: 108
GFR calc Af Amer: 57 — ABNORMAL LOW
Glucose, Bld: 103 — ABNORMAL HIGH
Potassium: 3.9
Sodium: 140

## 2011-03-22 LAB — CBC
HCT: 35.7 — ABNORMAL LOW
Hemoglobin: 12
MCHC: 33.5
RBC: 3.83 — ABNORMAL LOW

## 2011-03-22 LAB — LIPID PANEL
Total CHOL/HDL Ratio: 7.4
VLDL: 38

## 2011-03-22 LAB — PROTIME-INR: Prothrombin Time: 13.4

## 2011-03-22 LAB — CK TOTAL AND CKMB (NOT AT ARMC): CK, MB: 6 — ABNORMAL HIGH

## 2011-03-25 ENCOUNTER — Ambulatory Visit (HOSPITAL_COMMUNITY)
Admission: RE | Admit: 2011-03-25 | Discharge: 2011-03-25 | Disposition: A | Discharge status: Home / Self Care | Payer: Medicare Other | Source: Ambulatory Visit | Attending: Internal Medicine | Admitting: Internal Medicine

## 2011-03-25 DIAGNOSIS — R269 Unspecified abnormalities of gait and mobility: Secondary | ICD-10-CM | POA: Insufficient documentation

## 2011-03-25 DIAGNOSIS — R42 Dizziness and giddiness: Secondary | ICD-10-CM | POA: Insufficient documentation

## 2011-03-25 DIAGNOSIS — F29 Unspecified psychosis not due to a substance or known physiological condition: Secondary | ICD-10-CM | POA: Insufficient documentation

## 2011-03-25 DIAGNOSIS — R279 Unspecified lack of coordination: Secondary | ICD-10-CM | POA: Insufficient documentation

## 2011-03-25 DIAGNOSIS — G9389 Other specified disorders of brain: Secondary | ICD-10-CM | POA: Insufficient documentation

## 2011-03-25 MED ORDER — GADOBENATE DIMEGLUMINE 529 MG/ML IV SOLN
14.0000 mL | Freq: Once | INTRAVENOUS | Status: AC | PRN
  Administered 2011-03-25: 14 mL via INTRAVENOUS

## 2011-03-27 ENCOUNTER — Telehealth: Payer: Self-pay | Admitting: Internal Medicine

## 2011-03-27 NOTE — Telephone Encounter (Signed)
Normal MRI/MRA of brain with no vascular abnormalities.

## 2011-03-28 ENCOUNTER — Ambulatory Visit (INDEPENDENT_AMBULATORY_CARE_PROVIDER_SITE_OTHER): Payer: Medicare Other | Admitting: Psychology

## 2011-03-28 DIAGNOSIS — F331 Major depressive disorder, recurrent, moderate: Secondary | ICD-10-CM

## 2011-03-28 NOTE — Telephone Encounter (Signed)
Informed.

## 2011-03-29 ENCOUNTER — Telehealth: Payer: Self-pay | Admitting: Internal Medicine

## 2011-03-29 NOTE — Telephone Encounter (Signed)
Forwarded to Dr. Linda Hedges for review.

## 2011-04-09 ENCOUNTER — Encounter: Payer: Self-pay | Admitting: *Deleted

## 2011-04-11 ENCOUNTER — Encounter: Payer: Self-pay | Admitting: Internal Medicine

## 2011-04-11 ENCOUNTER — Ambulatory Visit (INDEPENDENT_AMBULATORY_CARE_PROVIDER_SITE_OTHER): Payer: Medicare Other | Admitting: Internal Medicine

## 2011-04-11 DIAGNOSIS — K5190 Ulcerative colitis, unspecified, without complications: Secondary | ICD-10-CM

## 2011-04-11 NOTE — Progress Notes (Signed)
HISTORY OF PRESENT ILLNESS:  [name] is a 65 y.o. female with a history of anxiety/depression, thyroid disease, GERD, IBS, and recently diagnosed ulcerative colitis on surveillance colonoscopy 12/05/2010. Prior to that colonoscopy, the patient reported a history of worsening diarrhea and weight loss. She was last seen in the office 02/06/2011. See that dictation. She was given samples of Asacol HD. 1.6 g twice a day recommended. She initiated this therapy, but subsequently decreased therapy to 800 mg twice a day stating constipation as a side effect. She states that she is unable to afford the medication. We did give her drug assistant card, but states she may have displaced and did not investigate if this would help with the cost of her prescription drug. She requires stay about Azulfidine. We discussed this previously. Importantly, she states that she is feeling well. Describes one formed bowel movement per day. No bleeding or mucus. No further weight loss. Complains of abdominal discomfort unchanged (for many years). No new issues.  REVIEW OF SYSTEMS:  All non-GI ROS negative except for shoulder pain and balance issues  Past Medical History  Diagnosis Date  . Anxiety   . Depression   . Thyroid disease   . Allergy   . GERD (gastroesophageal reflux disease)   . IBS (irritable bowel syndrome)   . Ulcerative colitis   . Attention deficit disorder without mention of hyperactivity   . Unspecified asthma   . Osteoarthrosis, unspecified whether generalized or localized, unspecified site   . Unspecified essential hypertension   . Other and unspecified hyperlipidemia     Past Surgical History  Procedure Date  . Cholecystectomy   . Colonoscopy   . Upper gastrointestinal endoscopy   . Back surgery     fluid removed from between disks  . Shoulder surgery     right shoulder 3x   . Shoulder replscement   . Toe surgery     right big toe  . Cystectomy     left under arm    Social  History [name]  reports that she quit smoking about 23 years ago. She has never used smokeless tobacco. She reports that she drinks about .5 ounces of alcohol per week. She reports that she does not use illicit drugs.  family history includes Arthritis in her father; Colon cancer (age of onset:96) in her father; Heart disease in her father; and Hypertension in her father.  There is no history of Diabetes.  Allergies  Allergen Reactions  . Amoxil (Amoxicillin Trihydrate) Hives       PHYSICAL EXAMINATION: Vital signs: Ht 5' 5"  (1.651 m)  Wt 149 lb 6.4 oz (67.767 kg)  BMI 24.86 kg/m2 General: Well-developed, well-nourished, no acute distress HEENT: Sclerae are anicteric, conjunctiva pink. Oral mucosa intact Lungs: Clear Heart: Regular Abdomen: soft, nontender, nondistended, no obvious ascites, no peritoneal signs, normal bowel sounds. No organomegaly. Extremities: No edema Psychiatric: alert and oriented x3. Cooperative    ASSESSMENT:  #1. Ulcerative colitis. Doing well clinically on Asacol HD 800 mg twice a day #2. Family history of colon cancer #3. IBS   PLAN:  #1. Offered her Azulfidine therapy, but she declined after hearing the side effect profile. As such, continue Asacol HD 800 mg twice a day. Several months of samples provided #2. Encouraged to discuss with her insurance carrier drug Insurance claims handler mesalamine options. As well, encouraged to investigate the drug assistance card #3. Routine office followup in 3 months. Contact the office in the interim for any  questions or problems.

## 2011-04-11 NOTE — Patient Instructions (Signed)
Samples of Asacol HD given with discount card to take to your pharmacy. Follow-up in 2 months with Dr. Marina Goodell.

## 2011-04-12 ENCOUNTER — Telehealth: Payer: Self-pay | Admitting: Internal Medicine

## 2011-04-12 ENCOUNTER — Other Ambulatory Visit: Payer: Self-pay | Admitting: Internal Medicine

## 2011-04-12 DIAGNOSIS — R279 Unspecified lack of coordination: Secondary | ICD-10-CM

## 2011-04-12 NOTE — Telephone Encounter (Signed)
Pt called stating that her Ortho MD though she should be referred to a Neurologist because she is still having problems with Ataxia

## 2011-04-15 ENCOUNTER — Encounter: Payer: Self-pay | Admitting: Neurology

## 2011-04-15 NOTE — Telephone Encounter (Signed)
K. Will refer to Dr. Jacelyn Grip

## 2011-04-16 ENCOUNTER — Telehealth: Payer: Self-pay | Admitting: Neurology

## 2011-04-16 NOTE — Telephone Encounter (Signed)
Pt is scheduled for 05/01/2011 but wants to be worked in sooner because she needs to have sx on her shoulder and Dr. [name] at [facility] [phone]) won't perform the sx until after she sees a neurologist. Is there a time she can come in before 11/7?

## 2011-04-16 NOTE — Telephone Encounter (Signed)
This Friday at 10:30.

## 2011-04-17 NOTE — Telephone Encounter (Signed)
Moved appointment to 04/19/2011 at 10:30 am.

## 2011-04-19 ENCOUNTER — Ambulatory Visit (INDEPENDENT_AMBULATORY_CARE_PROVIDER_SITE_OTHER): Payer: Medicare Other | Admitting: Neurology

## 2011-04-19 ENCOUNTER — Encounter: Payer: Self-pay | Admitting: Neurology

## 2011-04-19 VITALS — BP 136/78 | HR 76 | Wt 149.0 lb

## 2011-04-19 DIAGNOSIS — R29898 Other symptoms and signs involving the musculoskeletal system: Secondary | ICD-10-CM

## 2011-04-19 DIAGNOSIS — M6281 Muscle weakness (generalized): Secondary | ICD-10-CM

## 2011-04-19 DIAGNOSIS — G609 Hereditary and idiopathic neuropathy, unspecified: Secondary | ICD-10-CM

## 2011-04-19 DIAGNOSIS — R7309 Other abnormal glucose: Secondary | ICD-10-CM

## 2011-04-19 LAB — CBC WITH DIFFERENTIAL/PLATELET
Basophils Absolute: 0 10*3/uL (ref 0.0–0.1)
Eosinophils Absolute: 0.2 10*3/uL (ref 0.0–0.7)
Hemoglobin: 12.5 g/dL (ref 12.0–15.0)
Lymphocytes Relative: 15.9 % (ref 12.0–46.0)
MCHC: 32.6 g/dL (ref 30.0–36.0)
Neutro Abs: 5.8 10*3/uL (ref 1.4–7.7)
Platelets: 284 10*3/uL (ref 150.0–400.0)
RDW: 16.4 % — ABNORMAL HIGH (ref 11.5–14.6)

## 2011-04-19 LAB — COMPREHENSIVE METABOLIC PANEL
ALT: 19 U/L (ref 0–35)
AST: 16 U/L (ref 0–37)
Albumin: 3.8 g/dL (ref 3.5–5.2)
CO2: 30 mEq/L (ref 19–32)
Calcium: 9 mg/dL (ref 8.4–10.5)
Chloride: 108 mEq/L (ref 96–112)
Creatinine, Ser: 0.8 mg/dL (ref 0.4–1.2)
GFR: 75.33 mL/min (ref >60.00)
Potassium: 4.7 mEq/L (ref 3.5–5.1)
Sodium: 143 mEq/L (ref 135–145)
Total Protein: 6.1 g/dL (ref 6.0–8.3)

## 2011-04-19 LAB — HEMOGLOBIN A1C: Hgb A1c MFr Bld: 5.7 % (ref 4.6–6.5)

## 2011-04-19 LAB — TSH: TSH: 1.29 u[IU]/mL (ref 0.35–5.50)

## 2011-04-19 LAB — CK: Total CK: 97 U/L (ref 7–177)

## 2011-04-19 LAB — VITAMIN B12: Vitamin B-12: 319 pg/mL (ref 211–911)

## 2011-04-19 NOTE — Progress Notes (Signed)
Dear Dr. Linda [name],  Thank you for having me see [name] in consultation today at [facility] for her problem with weakness in her legs and falls.  As you may recall, she is a 65 y.o. year old female with a history of ulcerative colitis as well as lumbar spine surgery who has had progressive difficulty with weakness in the legs.  She denies involvement of the upper extremities.  She has a long history of "sciatica" that has been worse as of late.  She generally gets shooting pain down her left leg, that is worse when she sits on that side.  She also gets numbness in the left leg, which is temporally unrelated to her shooting pain.  She denies bladder incontinence.  She endorses pain in the muscles.  She has had multiple falls, either when she leans forwards or backwards and her "leg gives out".  She cannot get up from a seated position without using her hands.  Past Medical History  Diagnosis Date  . Anxiety   . Depression   . Thyroid disease   . Allergy   . GERD (gastroesophageal reflux disease)   . IBS (irritable bowel syndrome)   . Ulcerative colitis   . Attention deficit disorder without mention of hyperactivity   . Unspecified asthma   . Osteoarthrosis, unspecified whether generalized or localized, unspecified site   . Unspecified essential hypertension   . Other and unspecified hyperlipidemia     Past Surgical History  Procedure Date  . Cholecystectomy   . Colonoscopy   . Upper gastrointestinal endoscopy   . Back surgery     fluid removed from between disks  . Shoulder surgery     right shoulder 3x   . Shoulder replscement   . Toe surgery     right big toe  . Cystectomy     left under arm    History   Social History  . Marital Status: Married    Spouse Name: N/A    Number of Children: 3  . Years of Education: 16   Occupational History  . interior decorator    Social History Main Topics  . Smoking status: Former Smoker    Quit date: 10/30/1987    . Smokeless tobacco: Never Used  . Alcohol Use: 0.5 oz/week    1 drink(s) per week     once a week  . Drug Use: No  . Sexually Active: None   Other Topics Concern  . None   Social History Narrative   [employer]; post-graduate study [name].  married '68.  3 children: 2 daughters - '70, '72; 1 son '82; 6 grand-children. Lost her mother 2008, elderly father lives alone-in [place] died in 09/18/2022. owner/operator interior Agricultural consultant business. SO-in good health.  Marriage is a work in progress    Family History  Problem Relation Age of Onset  . Hypertension Father   . Colon cancer Father 35  . Arthritis Father   . Heart disease Father     CHF  . Diabetes Neg Hx     Current Outpatient Prescriptions on File Prior to Visit  Medication Sig Dispense Refill  . buPROPion (WELLBUTRIN XL) 300 MG 24 hr tablet TAKE ONE TABLET EACH DAY  30 tablet  6  . fish oil-omega-3 fatty acids 1000 MG capsule Take 2 g by mouth daily.        [name] FLUoxetine (PROZAC) 20 MG capsule Take 40 mg by mouth daily.       [name]  gabapentin (NEURONTIN) 300 MG capsule TAKE ONE CAPSULE THREE TIMES DAILY                                                  Generic for NEURONTI  90 capsule  3  . hyoscyamine (LEVSIN SL) 0.125 MG SL tablet Place 0.125 mg under the tongue as needed. 6 times a month       . ketoprofen (ORUDIS) 75 MG capsule Take 75 mg by mouth 3 (three) times daily as needed.        . lamoTRIgine (LAMICTAL) 200 MG tablet Take 200 mg by mouth daily.        . Mesalamine (ASACOL HD) 800 MG TBEC Take 2 tablets (1,600 mg total) by mouth 2 (two) times daily.  120 tablet  6  . methylphenidate (CONCERTA) 36 MG CR tablet Take 1 tablet (36 mg total) by mouth every morning. Fill on or after 01/05/2011. Brand name only  30 tablet  0  . Nutritional Supplements (GLUCOSAMINE COMPLEX) TABS Take by mouth daily.        . pantoprazole (PROTONIX) 40 MG tablet TAKE ONE TABLET TWICE DAILY  60 tablet  3  . pirbuterol (MAXAIR) 200 MCG/INH  inhaler Inhale 2 puffs into the lungs as needed.        [name] SYNTHROID 100 MCG tablet TAKE ONE TABLET EACH DAY  30 tablet  6  . zolpidem (AMBIEN) 10 MG tablet Take 10 mg by mouth at bedtime as needed.            ROS:  13 systems were reviewed and are notable for chronic right shoulder pain, which requires re-operation.  She has chronic joint pain elsewhere as well.  She takes Neurontin for pain as well as Lamictal for her mood.  She has had worsening of her UC, but it appears under better control at this time.  Concomitant with this was a weight loss of 50lbs.  All other review of systems are unremarkable.   Examination:  Filed Vitals:   04/19/11 1028  BP: 136/78  Pulse: 76  Weight: 149 lb (67.586 kg)     In general, well appearing older woman in NAD.  Cardiovascular: The patient has a regular rate and rhythm and no carotid bruits.  Fundoscopy:  Disks are flat. Vessel caliber within normal limits.  Mental status:   The patient is oriented to person, place and time. Recent and remote memory are intact. Attention span and concentration are normal. Language including repetition, naming, following commands are intact. Fund of knowledge of current and historical events, as well as vocabulary are normal.  Cranial Nerves: Pupils are equally round and reactive to light. 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   Extraocular movements are intact without nystagmus. Facial sensation and muscles of mastication are intact. Muscles of facial expression are symmetric. Hearing intact to bilateral finger rub. Tongue protrusion, uvula, palate midline.  Shoulder shrug intact, but inhibited by pain on the right.  Motor:  The patient has normal bulk and tone(no spasticity in legs), no pronator drift.  There are no adventitious movements.   SA EF EE FA HF KF KE FDF FPF Right 5 5 5 5 5 5 5 5 5  Left 5 5 5 5 4  4+ 4+ 5 5  Reflexes:    Biceps  Triceps Brachioradialis Knee Ankle  Right 2+  2+  2+   2+ 0  Left  2+  2+  2+   2+ 0  Toes down  Coordination:  Normal finger to nose.  Heal 2 shin was impaired, when lying and not looking at leg, but better when sitting and allowed to look at leg.  Sensation is decreased in a length dependent pattern to vibration and temperature in hands and feet.  Gait and Station is narrow based.   Romberg is positive.  MRI brain was reviewed and revealed T2 signal changes in the pons as well as what appeared to be dilated Tawny Hopping Robin spaces in the BG.  The cerebellum looked to be of normal morphology.   Impression: I believe her "weakness"  in her legs is due to her left lumbar spine radiculopathy.  She also has signs of a peripheral neuropathy, but given the lateralization of many of her findings to her left leg I don't think this is explains her gait problems and falls.  Her peripheral neuropathy may be secondary to her ulcerative colitis and as such it is going to be important to determine if it may be demyelinating in nature.     Recommendations:  1.  Peripheral neuropathy/Left lumbar radiculopathy - Left sided EMG/NCS to look for demyelinating features as well as confirm the roots affected.  I will also get an MRI of the L spine.  Also, PN screening labs and a CK because the history of muscle pain.   We will see the patient back in 6 weeks..  Thank you for having [place] see [name] in consultation.  Feel free to contact me with any questions.  Kavin Leech Jacelyn Grip, MD [facility], [place] [address], [place] [address] Phone: [phone] Fax: [phone].

## 2011-04-19 NOTE — Patient Instructions (Signed)
Your MRI is scheduled for Tuesday, October 30th at 12:00 noon.  Please arrive to [facility] by 11:45am.  [phone]  Your appointment for the nerve conduction studies and electromyelogram are scheduled for Monday, November 12th at 10:30am at  [facility] [address] [place] [id number].

## 2011-04-20 LAB — C-REACTIVE PROTEIN: CRP: 1.11 mg/dL — ABNORMAL HIGH (ref <0.60)

## 2011-04-23 ENCOUNTER — Ambulatory Visit (HOSPITAL_COMMUNITY)
Admission: RE | Admit: 2011-04-23 | Discharge: 2011-04-23 | Disposition: A | Discharge status: Home / Self Care | Payer: Medicare Other | Source: Ambulatory Visit | Attending: Neurology | Admitting: Neurology

## 2011-04-23 DIAGNOSIS — M5137 Other intervertebral disc degeneration, lumbosacral region: Secondary | ICD-10-CM | POA: Insufficient documentation

## 2011-04-23 DIAGNOSIS — R29898 Other symptoms and signs involving the musculoskeletal system: Secondary | ICD-10-CM

## 2011-04-23 DIAGNOSIS — M51379 Other intervertebral disc degeneration, lumbosacral region without mention of lumbar back pain or lower extremity pain: Secondary | ICD-10-CM | POA: Insufficient documentation

## 2011-04-23 DIAGNOSIS — M47817 Spondylosis without myelopathy or radiculopathy, lumbosacral region: Secondary | ICD-10-CM | POA: Insufficient documentation

## 2011-04-23 LAB — PROTEIN ELECTROPHORESIS, SERUM
Albumin ELP: 66.5 % — ABNORMAL HIGH (ref 55.8–66.1)
Alpha-1-Globulin: 6.5 % — ABNORMAL HIGH (ref 2.9–4.9)
Alpha-2-Globulin: 13 % — ABNORMAL HIGH (ref 7.1–11.8)
Beta Globulin: 6.3 % (ref 4.7–7.2)
Total Protein, Serum Electrophoresis: 5.7 g/dL — ABNORMAL LOW (ref 6.0–8.3)

## 2011-04-26 ENCOUNTER — Telehealth: Payer: Self-pay | Admitting: Neurology

## 2011-04-26 ENCOUNTER — Encounter: Payer: Self-pay | Admitting: Neurology

## 2011-04-26 NOTE — Telephone Encounter (Signed)
I believe you talked to her already, correct?

## 2011-05-02 LAB — METHYLMALONIC ACID, SERUM: Methylmalonic Acid, Quantitative: 218 nmol/L (ref 87–318)

## 2011-05-08 ENCOUNTER — Encounter: Payer: Self-pay | Admitting: Neurology

## 2011-05-13 ENCOUNTER — Telehealth: Payer: Self-pay | Admitting: *Deleted

## 2011-05-13 NOTE — Telephone Encounter (Signed)
Pt calling to refill on ambein, she request prescription to be sent to [facility]. Please Advise on refills

## 2011-05-14 ENCOUNTER — Telehealth: Payer: Self-pay

## 2011-05-14 MED ORDER — ZOLPIDEM TARTRATE 10 MG PO TABS: 10.0000 mg | ORAL_TABLET | Freq: Every evening | ORAL | Status: DC | PRN

## 2011-05-14 NOTE — Telephone Encounter (Signed)
Ok for refill x 5 

## 2011-05-14 NOTE — Telephone Encounter (Signed)
Pt calling for her MRI results

## 2011-05-20 ENCOUNTER — Telehealth: Payer: Self-pay | Admitting: Neurology

## 2011-05-20 NOTE — Telephone Encounter (Signed)
Spoke to patient about getting iliopsoas and vastus EMG on left as this was left out.    Tiffany - Could you set up a repeat EMG for [name].  She needs to have an EMG for her left hip flexor and knee extensor weakness.  I talked to Stacy Gardner and she said she would do it for free as she did not do it the first time.  Thx.

## 2011-05-21 ENCOUNTER — Ambulatory Visit (INDEPENDENT_AMBULATORY_CARE_PROVIDER_SITE_OTHER): Payer: Medicare Other | Admitting: Internal Medicine

## 2011-05-21 VITALS — BP 146/78 | HR 80 | Temp 98.1°F | Wt 150.0 lb

## 2011-05-21 DIAGNOSIS — J029 Acute pharyngitis, unspecified: Secondary | ICD-10-CM

## 2011-05-21 MED ORDER — AZITHROMYCIN 250 MG PO TABS: ORAL_TABLET | ORAL | Status: AC

## 2011-05-21 NOTE — Patient Instructions (Signed)
So sorry about the up-coming surgery but it is nice to have treatable problems. Today - most likely a pharyngitis, mild in that there is no fever or visible pus.   Plan - z-pak as directed           Gargle of choice.

## 2011-05-21 NOTE — Progress Notes (Signed)
  Subjective:    Patient ID: [name], female    DOB: 1946-05-31, 65 y.o.   MRN: [id number]  HPI [name] presents for follow-up. She has seen Dr. Jacelyn Grip for pain left leg, weakness in both legs with multiple falls. Dr. Jacelyn Grip ordered an MRI LS spine- reviewed - revealing multi-level DDD with spinal stenosis and foraminal crowding. She has had NCS/EMG w/ Dr. Jannifer Franklin in [facility]. She is diagnosed with sciatica and she will be seeing Dr. Vertell Limber for possible surgical intervention.   She also has increased pain in right shoulder with intra-articular derangement - [name] will be seeing her for surgical intervention.  She also has colitis - followed by Dr. Henrene Pastor. She is on asacol and she still has frequent loose stools.   She has seen Dr. Sabra Heck for psychiatry who increased her prozac to 40 mg q AM and on this dose she is feeling better. She reports that the above medical problems have contributed to a persistent depression.  She awoke this AM with a very painfu throat, difficulty with swallow due to pain, she has been able to handle her secretions. She has had a cold but no fever. She has had no enlarged lymph nodes, no myalgias.  I have reviewed the patient's medical history in detail and updated the computerized patient record.    Review of Systems  Constitutional: Positive for chills. Negative for fever, activity change and appetite change.  HENT: Positive for congestion, sore throat, rhinorrhea, postnasal drip and sinus pressure. Negative for ear pain, facial swelling and neck pain.   Eyes: Negative.   Respiratory: Negative for cough, chest tightness and wheezing.   Cardiovascular: Negative.   Gastrointestinal: Negative.   Genitourinary: Negative.   Musculoskeletal: Positive for back pain and gait problem.  Neurological: Positive for weakness and numbness. Negative for dizziness and headaches.  Hematological: Negative.   Psychiatric/Behavioral: Positive for dysphoric mood.        Objective:   Physical Exam Vitals reviewed - BP mildly elevated, afebril Gen'l - WNWD white woman in no distress who has kept her weight down HEENT - no sinus tenderness to percussion, mild erythema uvula and posterior pharynx Chest - clear       Assessment & Plan:  Pharyngitis - mild  Plan - z-pak            Gargle of choice

## 2011-06-12 ENCOUNTER — Other Ambulatory Visit: Payer: Self-pay | Admitting: Internal Medicine

## 2011-06-28 DIAGNOSIS — M545 Low back pain: Secondary | ICD-10-CM | POA: Diagnosis not present

## 2011-07-04 ENCOUNTER — Telehealth: Payer: Self-pay | Admitting: Internal Medicine

## 2011-07-04 NOTE — Telephone Encounter (Signed)
Called and lmom for pt

## 2011-07-04 NOTE — Telephone Encounter (Signed)
Ok for end of the day

## 2011-07-04 NOTE — Telephone Encounter (Signed)
The pt called requesting a same day appt for cough and congestion.  She was offered an opening with Dr.Jones, but refused.  Can she be worked in today?  Thanks!

## 2011-07-10 DIAGNOSIS — J019 Acute sinusitis, unspecified: Secondary | ICD-10-CM | POA: Diagnosis not present

## 2011-07-10 DIAGNOSIS — J029 Acute pharyngitis, unspecified: Secondary | ICD-10-CM | POA: Diagnosis not present

## 2011-07-12 ENCOUNTER — Telehealth: Payer: Self-pay | Admitting: *Deleted

## 2011-07-12 NOTE — Telephone Encounter (Signed)
Refill request for gabapentin 300mg  #90 with 3 refills. Last refilled on 5.2.12 OK to refill?

## 2011-07-12 NOTE — Telephone Encounter (Signed)
OK to fill this prescription with additional refills x3 Thank you!

## 2011-07-16 MED ORDER — GABAPENTIN 300 MG PO CAPS: 300.0000 mg | ORAL_CAPSULE | Freq: Three times a day (TID) | ORAL | Status: DC

## 2011-07-16 NOTE — Telephone Encounter (Signed)
Phoned in refill.

## 2011-07-17 ENCOUNTER — Other Ambulatory Visit (HOSPITAL_COMMUNITY): Payer: Medicare Other

## 2011-07-19 ENCOUNTER — Other Ambulatory Visit (HOSPITAL_COMMUNITY): Payer: Medicare Other

## 2011-07-22 ENCOUNTER — Ambulatory Visit: Payer: Medicare Other | Admitting: Internal Medicine

## 2011-07-29 DIAGNOSIS — B351 Tinea unguium: Secondary | ICD-10-CM | POA: Diagnosis not present

## 2011-07-29 DIAGNOSIS — Z9660 Presence of unspecified orthopedic joint implant: Secondary | ICD-10-CM | POA: Diagnosis not present

## 2011-07-29 DIAGNOSIS — Z87891 Personal history of nicotine dependence: Secondary | ICD-10-CM | POA: Diagnosis not present

## 2011-07-29 DIAGNOSIS — M79609 Pain in unspecified limb: Secondary | ICD-10-CM | POA: Diagnosis not present

## 2011-07-29 DIAGNOSIS — M25539 Pain in unspecified wrist: Secondary | ICD-10-CM | POA: Diagnosis not present

## 2011-07-29 DIAGNOSIS — M24576 Contracture, unspecified foot: Secondary | ICD-10-CM | POA: Diagnosis not present

## 2011-07-29 DIAGNOSIS — Q828 Other specified congenital malformations of skin: Secondary | ICD-10-CM | POA: Diagnosis not present

## 2011-07-29 DIAGNOSIS — M25519 Pain in unspecified shoulder: Secondary | ICD-10-CM | POA: Diagnosis not present

## 2011-07-29 DIAGNOSIS — S0180XA Unspecified open wound of other part of head, initial encounter: Secondary | ICD-10-CM | POA: Diagnosis not present

## 2011-07-29 DIAGNOSIS — S01501A Unspecified open wound of lip, initial encounter: Secondary | ICD-10-CM | POA: Diagnosis not present

## 2011-07-29 DIAGNOSIS — W010XXA Fall on same level from slipping, tripping and stumbling without subsequent striking against object, initial encounter: Secondary | ICD-10-CM | POA: Diagnosis not present

## 2011-07-29 DIAGNOSIS — E039 Hypothyroidism, unspecified: Secondary | ICD-10-CM | POA: Diagnosis not present

## 2011-07-29 DIAGNOSIS — M25529 Pain in unspecified elbow: Secondary | ICD-10-CM | POA: Diagnosis not present

## 2011-07-29 DIAGNOSIS — M2040 Other hammer toe(s) (acquired), unspecified foot: Secondary | ICD-10-CM | POA: Diagnosis not present

## 2011-07-29 DIAGNOSIS — M24573 Contracture, unspecified ankle: Secondary | ICD-10-CM | POA: Diagnosis not present

## 2011-07-30 DIAGNOSIS — M2040 Other hammer toe(s) (acquired), unspecified foot: Secondary | ICD-10-CM | POA: Diagnosis not present

## 2011-07-30 DIAGNOSIS — M24573 Contracture, unspecified ankle: Secondary | ICD-10-CM | POA: Diagnosis not present

## 2011-08-01 ENCOUNTER — Telehealth: Payer: Self-pay | Admitting: Internal Medicine

## 2011-08-01 NOTE — Telephone Encounter (Signed)
Need another Dx insurance will not cover the dx provided 781.3 per carrie at [facility] scheduling [phone] [id number] - Victor was not done per [facility] could not use that dx code . Need a different sent a dx code  Sent: 03/13/2011 9:20 AM to you regarding did not get a response

## 2011-08-01 NOTE — Telephone Encounter (Signed)
Error closing phone note

## 2011-08-09 ENCOUNTER — Other Ambulatory Visit: Payer: Self-pay

## 2011-08-09 ENCOUNTER — Ambulatory Visit: Payer: Medicare Other | Admitting: Internal Medicine

## 2011-08-09 ENCOUNTER — Encounter (HOSPITAL_COMMUNITY)
Admission: RE | Admit: 2011-08-09 | Discharge: 2011-08-09 | Disposition: A | Discharge status: Home / Self Care | Payer: Medicare Other | Source: Ambulatory Visit | Attending: Orthopedic Surgery | Admitting: Orthopedic Surgery

## 2011-08-09 ENCOUNTER — Encounter (HOSPITAL_COMMUNITY)
Admission: RE | Admit: 2011-08-09 | Discharge: 2011-08-09 | Disposition: A | Discharge status: Home / Self Care | Payer: Medicare Other | Source: Ambulatory Visit | Attending: Anesthesiology | Admitting: Anesthesiology

## 2011-08-09 DIAGNOSIS — I10 Essential (primary) hypertension: Secondary | ICD-10-CM | POA: Diagnosis not present

## 2011-08-09 DIAGNOSIS — J9819 Other pulmonary collapse: Secondary | ICD-10-CM | POA: Diagnosis not present

## 2011-08-09 DIAGNOSIS — M899 Disorder of bone, unspecified: Secondary | ICD-10-CM | POA: Diagnosis not present

## 2011-08-09 DIAGNOSIS — Z96619 Presence of unspecified artificial shoulder joint: Secondary | ICD-10-CM | POA: Diagnosis not present

## 2011-08-09 DIAGNOSIS — E039 Hypothyroidism, unspecified: Secondary | ICD-10-CM | POA: Diagnosis not present

## 2011-08-09 LAB — TYPE AND SCREEN: Antibody Screen: NEGATIVE

## 2011-08-09 LAB — CBC
HCT: 37.6 % (ref 36.0–46.0)
Hemoglobin: 11.8 g/dL — ABNORMAL LOW (ref 12.0–15.0)
WBC: 9.3 10*3/uL (ref 4.0–10.5)

## 2011-08-09 LAB — BASIC METABOLIC PANEL
Chloride: 105 mEq/L (ref 96–112)
GFR calc Af Amer: 84 mL/min — ABNORMAL LOW (ref >90)
Potassium: 4.5 mEq/L (ref 3.5–5.1)
Sodium: 142 mEq/L (ref 135–145)

## 2011-08-09 LAB — SURGICAL PCR SCREEN
MRSA, PCR: NEGATIVE
Staphylococcus aureus: NEGATIVE

## 2011-08-09 MED ORDER — CHLORHEXIDINE GLUCONATE 4 % EX LIQD: 60.0000 mL | Freq: Once | CUTANEOUS | Status: DC

## 2011-08-09 NOTE — Pre-Procedure Instructions (Signed)
20 [name]  08/09/2011   Your procedure is scheduled on:  Aug 16, 2011 (Friday)  Report to [place] at 5:30 AM.  Call this number if you have problems the morning of surgery: [phone]   Remember:   Do not eat food:After Midnight.  May have clear liquids: up to 4 Hours before arrival.  Clear liquids include soda, tea, black coffee, apple or grape juice, broth.  Take these medicines the morning of surgery with A SIP OF WATER: wellbutrin,prozac,lamictal,concerta,protonix,synthroid   Do not wear jewelry, make-up or nail polish.  Do not wear lotions, powders, or perfumes. You may wear deodorant.  Do not shave 48 hours prior to surgery.  Do not bring valuables to the hospital.  Contacts, dentures or bridgework may not be worn into surgery.  Leave suitcase in the car. After surgery it may be brought to your room.  For patients admitted to the hospital, checkout time is 11:00 AM the day of discharge.   Patients discharged the day of surgery will not be allowed to drive home.  Name and phone number of your driver: NA  Special Instructions: Incentive Spirometry - Practice and bring it with you on the day of surgery. and CHG Shower Use Special Wash: 1/2 bottle night before surgery and 1/2 bottle morning of surgery.   Please read over the following fact sheets that you were given: Pain Booklet, Blood Transfusion Information, MRSA Information and Surgical Site Infection Prevention

## 2011-08-13 ENCOUNTER — Ambulatory Visit: Payer: Medicare Other | Admitting: Internal Medicine

## 2011-08-14 DIAGNOSIS — F331 Major depressive disorder, recurrent, moderate: Secondary | ICD-10-CM | POA: Diagnosis not present

## 2011-08-14 DIAGNOSIS — F909 Attention-deficit hyperactivity disorder, unspecified type: Secondary | ICD-10-CM | POA: Diagnosis not present

## 2011-08-15 DIAGNOSIS — M25519 Pain in unspecified shoulder: Secondary | ICD-10-CM | POA: Diagnosis not present

## 2011-08-15 MED ORDER — VANCOMYCIN HCL IN DEXTROSE 1-5 GM/200ML-% IV SOLN
1000.0000 mg | INTRAVENOUS | Status: AC
  Administered 2011-08-16: 1000 mg via INTRAVENOUS
  Filled 2011-08-15: qty 200

## 2011-08-15 MED ORDER — SODIUM CHLORIDE 0.9 % IV SOLN: INTRAVENOUS | Status: DC

## 2011-08-15 NOTE — H&P (Signed)
CC: right shoulder pain HPI: 66 y/o female with worsening right shoulder pain. Pt has elected to have a revision total shoulder arthroplasty by Dr. Veverly Fells to decrease pain and increase function PMH: anxiety, depression, GERD, hypothyroid Allergies: augmentin, intolerace to morphine Social: non smoker, occasional etoh, no illicit drugs ROS: pain with rom right shoulder otherwise negative PE: alert and appropriate 66 y/o female in no acute distress alert and oriented x 3 Cervical spine: full rom, cranial nerves 2-12 intact Right shoulder: moderately decreased rom, nv intact distally, no rashes or edema, strength 3.5/5 Chest: active breath sounds bilaterally Abd: nontender, nondistended Pelvis stable Normal heel toe gait Assessment: right shoulder pain s/p arthroplasty Plan: revision right total shoulder arthroplasty to a reverse to decrease pain

## 2011-08-16 ENCOUNTER — Encounter (HOSPITAL_COMMUNITY): Payer: Self-pay | Admitting: Surgery

## 2011-08-16 ENCOUNTER — Inpatient Hospital Stay (HOSPITAL_COMMUNITY)
Admission: RE | Admit: 2011-08-16 | Discharge: 2011-08-18 | DRG: 517 | Disposition: A | Discharge status: Home / Self Care | Payer: Medicare Other | Source: Ambulatory Visit | Attending: Orthopedic Surgery | Admitting: Orthopedic Surgery

## 2011-08-16 ENCOUNTER — Ambulatory Visit (HOSPITAL_COMMUNITY): Payer: Medicare Other

## 2011-08-16 ENCOUNTER — Other Ambulatory Visit: Payer: Self-pay | Admitting: Orthopedic Surgery

## 2011-08-16 ENCOUNTER — Encounter (HOSPITAL_COMMUNITY): Payer: Self-pay

## 2011-08-16 ENCOUNTER — Encounter (HOSPITAL_COMMUNITY)
Admission: RE | Disposition: A | Discharge status: Home / Self Care | Payer: Self-pay | Source: Ambulatory Visit | Attending: Orthopedic Surgery

## 2011-08-16 ENCOUNTER — Encounter (HOSPITAL_COMMUNITY): Payer: Self-pay | Admitting: Orthopedic Surgery

## 2011-08-16 DIAGNOSIS — Z471 Aftercare following joint replacement surgery: Secondary | ICD-10-CM | POA: Diagnosis not present

## 2011-08-16 DIAGNOSIS — F411 Generalized anxiety disorder: Secondary | ICD-10-CM | POA: Diagnosis present

## 2011-08-16 DIAGNOSIS — Z01812 Encounter for preprocedural laboratory examination: Secondary | ICD-10-CM

## 2011-08-16 DIAGNOSIS — M25819 Other specified joint disorders, unspecified shoulder: Secondary | ICD-10-CM | POA: Diagnosis not present

## 2011-08-16 DIAGNOSIS — F3289 Other specified depressive episodes: Secondary | ICD-10-CM | POA: Diagnosis present

## 2011-08-16 DIAGNOSIS — M949 Disorder of cartilage, unspecified: Secondary | ICD-10-CM | POA: Diagnosis present

## 2011-08-16 DIAGNOSIS — K219 Gastro-esophageal reflux disease without esophagitis: Secondary | ICD-10-CM | POA: Diagnosis present

## 2011-08-16 DIAGNOSIS — M25519 Pain in unspecified shoulder: Secondary | ICD-10-CM | POA: Diagnosis not present

## 2011-08-16 DIAGNOSIS — F329 Major depressive disorder, single episode, unspecified: Secondary | ICD-10-CM | POA: Diagnosis present

## 2011-08-16 DIAGNOSIS — Z886 Allergy status to analgesic agent status: Secondary | ICD-10-CM | POA: Diagnosis not present

## 2011-08-16 DIAGNOSIS — Z96619 Presence of unspecified artificial shoulder joint: Secondary | ICD-10-CM | POA: Diagnosis not present

## 2011-08-16 DIAGNOSIS — Z888 Allergy status to other drugs, medicaments and biological substances status: Secondary | ICD-10-CM | POA: Diagnosis not present

## 2011-08-16 DIAGNOSIS — M659 Synovitis and tenosynovitis, unspecified: Secondary | ICD-10-CM | POA: Diagnosis not present

## 2011-08-16 DIAGNOSIS — M899 Disorder of bone, unspecified: Secondary | ICD-10-CM | POA: Diagnosis not present

## 2011-08-16 DIAGNOSIS — T84498A Other mechanical complication of other internal orthopedic devices, implants and grafts, initial encounter: Secondary | ICD-10-CM | POA: Diagnosis not present

## 2011-08-16 DIAGNOSIS — E039 Hypothyroidism, unspecified: Secondary | ICD-10-CM | POA: Diagnosis not present

## 2011-08-16 DIAGNOSIS — M869 Osteomyelitis, unspecified: Secondary | ICD-10-CM | POA: Diagnosis not present

## 2011-08-16 DIAGNOSIS — G8918 Other acute postprocedural pain: Secondary | ICD-10-CM | POA: Diagnosis not present

## 2011-08-16 DIAGNOSIS — I10 Essential (primary) hypertension: Secondary | ICD-10-CM | POA: Diagnosis present

## 2011-08-16 DIAGNOSIS — R899 Unspecified abnormal finding in specimens from other organs, systems and tissues: Secondary | ICD-10-CM | POA: Diagnosis not present

## 2011-08-16 LAB — GRAM STAIN

## 2011-08-16 SURGERY — REVISION, TOTAL ARTHROPLASTY, SHOULDER
Anesthesia: Regional | Site: Shoulder | Laterality: Right | Wound class: Clean

## 2011-08-16 MED ORDER — WHITE PETROLATUM GEL
Status: AC
  Administered 2011-08-16: 14:00:00
  Filled 2011-08-16: qty 5

## 2011-08-16 MED ORDER — ONDANSETRON HCL 4 MG PO TABS: 4.0000 mg | ORAL_TABLET | Freq: Four times a day (QID) | ORAL | Status: DC | PRN

## 2011-08-16 MED ORDER — PHENOL 1.4 % MT LIQD
1.0000 | OROMUCOSAL | Status: DC | PRN
  Filled 2011-08-16: qty 177

## 2011-08-16 MED ORDER — CEFAZOLIN SODIUM 1-5 GM-% IV SOLN
1.0000 g | Freq: Four times a day (QID) | INTRAVENOUS | Status: AC
  Administered 2011-08-16 – 2011-08-17 (×3): 1 g via INTRAVENOUS
  Filled 2011-08-16 (×4): qty 50

## 2011-08-16 MED ORDER — METHOCARBAMOL 100 MG/ML IJ SOLN: 500.0000 mg | Freq: Four times a day (QID) | INTRAVENOUS | Status: DC | PRN

## 2011-08-16 MED ORDER — FLUTICASONE PROPIONATE 50 MCG/ACT NA SUSP
2.0000 | Freq: Every day | NASAL | Status: DC
  Filled 2011-08-16: qty 16

## 2011-08-16 MED ORDER — DIPHENOXYLATE-ATROPINE 2.5-0.025 MG PO TABS: 1.0000 | ORAL_TABLET | Freq: Four times a day (QID) | ORAL | Status: DC | PRN

## 2011-08-16 MED ORDER — LACTATED RINGERS IV SOLN
INTRAVENOUS | Status: DC | PRN
  Administered 2011-08-16 (×2): via INTRAVENOUS

## 2011-08-16 MED ORDER — PROMETHAZINE HCL 12.5 MG PO TABS: 12.5000 mg | ORAL_TABLET | Freq: Four times a day (QID) | ORAL | Status: DC | PRN

## 2011-08-16 MED ORDER — LAMOTRIGINE 200 MG PO TABS
200.0000 mg | ORAL_TABLET | Freq: Every day | ORAL | Status: DC
  Administered 2011-08-16 – 2011-08-18 (×3): 200 mg via ORAL
  Filled 2011-08-16 (×3): qty 1

## 2011-08-16 MED ORDER — ONDANSETRON HCL 4 MG/2ML IJ SOLN
INTRAMUSCULAR | Status: DC | PRN
  Administered 2011-08-16: 4 mg via INTRAVENOUS

## 2011-08-16 MED ORDER — MIDAZOLAM HCL 5 MG/5ML IJ SOLN
INTRAMUSCULAR | Status: DC | PRN
  Administered 2011-08-16: 2 mg via INTRAVENOUS

## 2011-08-16 MED ORDER — FLUOXETINE HCL 20 MG PO CAPS
40.0000 mg | ORAL_CAPSULE | Freq: Every day | ORAL | Status: DC
  Administered 2011-08-17 – 2011-08-18 (×2): 40 mg via ORAL
  Filled 2011-08-16 (×2): qty 2

## 2011-08-16 MED ORDER — NEOSTIGMINE METHYLSULFATE 1 MG/ML IJ SOLN
INTRAMUSCULAR | Status: DC | PRN
  Administered 2011-08-16: 3.5 mg via INTRAVENOUS

## 2011-08-16 MED ORDER — BUPIVACAINE-EPINEPHRINE PF 0.5-1:200000 % IJ SOLN
INTRAMUSCULAR | Status: DC | PRN
  Administered 2011-08-16: 150 mg

## 2011-08-16 MED ORDER — ACETAMINOPHEN 650 MG RE SUPP: 650.0000 mg | Freq: Four times a day (QID) | RECTAL | Status: DC | PRN

## 2011-08-16 MED ORDER — BUPROPION HCL ER (XL) 300 MG PO TB24
300.0000 mg | ORAL_TABLET | Freq: Every day | ORAL | Status: DC
  Administered 2011-08-17 – 2011-08-18 (×2): 300 mg via ORAL
  Filled 2011-08-16 (×2): qty 1

## 2011-08-16 MED ORDER — METOCLOPRAMIDE HCL 5 MG/ML IJ SOLN
5.0000 mg | Freq: Three times a day (TID) | INTRAMUSCULAR | Status: DC | PRN
  Filled 2011-08-16: qty 2

## 2011-08-16 MED ORDER — BUPIVACAINE-EPINEPHRINE 0.25% -1:200000 IJ SOLN
INTRAMUSCULAR | Status: DC | PRN
  Administered 2011-08-16: 7 mL

## 2011-08-16 MED ORDER — ZOLPIDEM TARTRATE 10 MG PO TABS: 10.0000 mg | ORAL_TABLET | Freq: Every evening | ORAL | Status: DC | PRN

## 2011-08-16 MED ORDER — LOSARTAN POTASSIUM 50 MG PO TABS
50.0000 mg | ORAL_TABLET | Freq: Every day | ORAL | Status: DC
  Filled 2011-08-16 (×3): qty 1

## 2011-08-16 MED ORDER — GABAPENTIN 300 MG PO CAPS
300.0000 mg | ORAL_CAPSULE | Freq: Three times a day (TID) | ORAL | Status: DC
Start: 2011-08-16 — End: 2011-08-18
  Administered 2011-08-16 – 2011-08-18 (×6): 300 mg via ORAL
  Filled 2011-08-16 (×8): qty 1

## 2011-08-16 MED ORDER — ALPRAZOLAM 0.5 MG PO TABS: 0.5000 mg | ORAL_TABLET | Freq: Three times a day (TID) | ORAL | Status: DC | PRN

## 2011-08-16 MED ORDER — METHYLPHENIDATE HCL ER (OSM) 18 MG PO TBCR: 36.0000 mg | EXTENDED_RELEASE_TABLET | Freq: Every day | ORAL | Status: DC

## 2011-08-16 MED ORDER — LOSARTAN POTASSIUM-HCTZ 50-12.5 MG PO TABS: 1.0000 | ORAL_TABLET | Freq: Every day | ORAL | Status: DC

## 2011-08-16 MED ORDER — EPHEDRINE SULFATE 50 MG/ML IJ SOLN
INTRAMUSCULAR | Status: DC | PRN
  Administered 2011-08-16: 10 mg via INTRAVENOUS

## 2011-08-16 MED ORDER — ACETAMINOPHEN 325 MG PO TABS: 650.0000 mg | ORAL_TABLET | Freq: Four times a day (QID) | ORAL | Status: DC | PRN

## 2011-08-16 MED ORDER — ONDANSETRON HCL 4 MG/2ML IJ SOLN: 4.0000 mg | Freq: Four times a day (QID) | INTRAMUSCULAR | Status: DC | PRN

## 2011-08-16 MED ORDER — GLYCOPYRROLATE 0.2 MG/ML IJ SOLN
INTRAMUSCULAR | Status: DC | PRN
  Administered 2011-08-16: 0.2 mg via INTRAVENOUS
  Administered 2011-08-16: .7 mg via INTRAVENOUS

## 2011-08-16 MED ORDER — ONDANSETRON HCL 4 MG/2ML IJ SOLN: 4.0000 mg | Freq: Once | INTRAMUSCULAR | Status: DC | PRN

## 2011-08-16 MED ORDER — SODIUM CHLORIDE 0.9 % IR SOLN
Status: DC | PRN
  Administered 2011-08-16: 1
  Administered 2011-08-16: 3000 mL

## 2011-08-16 MED ORDER — HYDROCHLOROTHIAZIDE 12.5 MG PO CAPS
12.5000 mg | ORAL_CAPSULE | Freq: Every day | ORAL | Status: DC
  Filled 2011-08-16 (×3): qty 1

## 2011-08-16 MED ORDER — LEVOTHYROXINE SODIUM 100 MCG PO TABS
100.0000 ug | ORAL_TABLET | Freq: Every day | ORAL | Status: DC
  Administered 2011-08-17 – 2011-08-18 (×2): 100 ug via ORAL
  Filled 2011-08-16 (×3): qty 1

## 2011-08-16 MED ORDER — METHYLPHENIDATE HCL ER (OSM) 36 MG PO TBCR: 36.0000 mg | EXTENDED_RELEASE_TABLET | ORAL | Status: DC

## 2011-08-16 MED ORDER — PROPOFOL 10 MG/ML IV EMUL
INTRAVENOUS | Status: DC | PRN
  Administered 2011-08-16: 200 mg via INTRAVENOUS

## 2011-08-16 MED ORDER — OXYCODONE-ACETAMINOPHEN 5-325 MG PO TABS
1.0000 | ORAL_TABLET | ORAL | Status: DC | PRN
  Administered 2011-08-16 (×2): 1 via ORAL
  Administered 2011-08-17: 2 via ORAL
  Administered 2011-08-17 (×2): 1 via ORAL
  Administered 2011-08-17 – 2011-08-18 (×2): 2 via ORAL
  Administered 2011-08-18: 1 via ORAL
  Filled 2011-08-16: qty 1
  Filled 2011-08-16: qty 2
  Filled 2011-08-16 (×2): qty 1
  Filled 2011-08-16: qty 2
  Filled 2011-08-16 (×2): qty 1
  Filled 2011-08-16: qty 2

## 2011-08-16 MED ORDER — SODIUM CHLORIDE 0.9 % IV SOLN
10.0000 mg | INTRAVENOUS | Status: DC | PRN
  Administered 2011-08-16: 15 ug/min via INTRAVENOUS

## 2011-08-16 MED ORDER — MESALAMINE 800 MG PO TBEC: 1.0000 | DELAYED_RELEASE_TABLET | Freq: Every day | ORAL | Status: DC

## 2011-08-16 MED ORDER — HYOSCYAMINE SULFATE 0.125 MG SL SUBL
0.1250 mg | SUBLINGUAL_TABLET | SUBLINGUAL | Status: DC | PRN
  Filled 2011-08-16: qty 1

## 2011-08-16 MED ORDER — HYDROMORPHONE HCL PF 1 MG/ML IJ SOLN: 0.2500 mg | INTRAMUSCULAR | Status: DC | PRN

## 2011-08-16 MED ORDER — MENTHOL 3 MG MT LOZG: 1.0000 | LOZENGE | OROMUCOSAL | Status: DC | PRN

## 2011-08-16 MED ORDER — POTASSIUM CHLORIDE IN NACL 20-0.9 MEQ/L-% IV SOLN
INTRAVENOUS | Status: DC
  Administered 2011-08-16: 17:00:00 via INTRAVENOUS
  Filled 2011-08-16 (×4): qty 1000

## 2011-08-16 MED ORDER — METOCLOPRAMIDE HCL 10 MG PO TABS: 5.0000 mg | ORAL_TABLET | Freq: Three times a day (TID) | ORAL | Status: DC | PRN

## 2011-08-16 MED ORDER — METOCLOPRAMIDE HCL 10 MG PO TABS: 5.0000 mg | ORAL_TABLET | Freq: Four times a day (QID) | ORAL | Status: DC | PRN

## 2011-08-16 MED ORDER — PANTOPRAZOLE SODIUM 40 MG PO TBEC
40.0000 mg | DELAYED_RELEASE_TABLET | Freq: Two times a day (BID) | ORAL | Status: DC
  Administered 2011-08-17 – 2011-08-18 (×3): 40 mg via ORAL
  Filled 2011-08-16 (×3): qty 1

## 2011-08-16 MED ORDER — VECURONIUM BROMIDE 10 MG IV SOLR
INTRAVENOUS | Status: DC | PRN
  Administered 2011-08-16: 6 mg via INTRAVENOUS

## 2011-08-16 MED ORDER — METHOCARBAMOL 500 MG PO TABS
500.0000 mg | ORAL_TABLET | Freq: Four times a day (QID) | ORAL | Status: DC | PRN
  Administered 2011-08-16 – 2011-08-18 (×4): 500 mg via ORAL
  Filled 2011-08-16 (×4): qty 1

## 2011-08-16 MED ORDER — HYDROMORPHONE HCL PF 1 MG/ML IJ SOLN
0.5000 mg | INTRAMUSCULAR | Status: DC | PRN
  Administered 2011-08-17 (×2): 1 mg via INTRAVENOUS
  Filled 2011-08-16 (×3): qty 1

## 2011-08-16 MED ORDER — ALBUTEROL SULFATE HFA 108 (90 BASE) MCG/ACT IN AERS
1.0000 | INHALATION_SPRAY | Freq: Four times a day (QID) | RESPIRATORY_TRACT | Status: DC | PRN
  Filled 2011-08-16: qty 6.7

## 2011-08-16 MED ORDER — SIMVASTATIN 20 MG PO TABS
20.0000 mg | ORAL_TABLET | Freq: Every evening | ORAL | Status: DC
  Filled 2011-08-16 (×3): qty 1

## 2011-08-16 MED ORDER — ACETAMINOPHEN 10 MG/ML IV SOLN
INTRAVENOUS | Status: DC | PRN
  Administered 2011-08-16: 1000 mg via INTRAVENOUS

## 2011-08-16 MED ORDER — FENTANYL CITRATE 0.05 MG/ML IJ SOLN
INTRAMUSCULAR | Status: DC | PRN
  Administered 2011-08-16: 100 ug via INTRAVENOUS
  Administered 2011-08-16: 50 ug via INTRAVENOUS

## 2011-08-16 MED ORDER — MESALAMINE 400 MG PO TBEC
800.0000 mg | DELAYED_RELEASE_TABLET | Freq: Every day | ORAL | Status: DC
  Administered 2011-08-17 – 2011-08-18 (×2): 800 mg via ORAL
  Filled 2011-08-16 (×3): qty 2

## 2011-08-16 MED ORDER — KETOPROFEN 75 MG PO CAPS
75.0000 mg | ORAL_CAPSULE | Freq: Three times a day (TID) | ORAL | Status: DC
  Administered 2011-08-16 – 2011-08-18 (×6): 75 mg via ORAL
  Filled 2011-08-16 (×8): qty 1

## 2011-08-16 SURGICAL SUPPLY — 73 items
BOWL SMART MIX CTS (DISPOSABLE) IMPLANT
BRUSH FEMORAL CANAL (MISCELLANEOUS) IMPLANT
BUR SURG 4X8 MED (BURR) IMPLANT
BURR SURG 4X8 MED (BURR)
CLOTH BEACON ORANGE TIMEOUT ST (SAFETY) ×2 IMPLANT
CONT SPECI 4OZ STER CLIK (MISCELLANEOUS) ×4 IMPLANT
COVER SURGICAL LIGHT HANDLE (MISCELLANEOUS) ×2 IMPLANT
DRAPE INCISE IOBAN 66X45 STRL (DRAPES) ×2 IMPLANT
DRAPE U-SHAPE 47X51 STRL (DRAPES) ×2 IMPLANT
DRAPE X-RAY CASS 24X20 (DRAPES) IMPLANT
DRILL BIT 5/64 (BIT) IMPLANT
DRSG ADAPTIC 3X8 NADH LF (GAUZE/BANDAGES/DRESSINGS) ×2 IMPLANT
DRSG PAD ABDOMINAL 8X10 ST (GAUZE/BANDAGES/DRESSINGS) ×2 IMPLANT
DURAPREP 26ML APPLICATOR (WOUND CARE) ×2 IMPLANT
ELECT BLADE 4.0 EZ CLEAN MEGAD (MISCELLANEOUS) ×2
ELECT NEEDLE TIP 2.8 STRL (NEEDLE) ×2 IMPLANT
ELECT REM PT RETURN 9FT ADLT (ELECTROSURGICAL) ×2
ELECTRODE BLDE 4.0 EZ CLN MEGD (MISCELLANEOUS) ×1 IMPLANT
ELECTRODE REM PT RTRN 9FT ADLT (ELECTROSURGICAL) ×1 IMPLANT
GLENOSPHERE ECC 42 (Orthopedic Implant) ×2 IMPLANT
GLOVE BIOGEL PI IND STRL 8 (GLOVE) ×1 IMPLANT
GLOVE BIOGEL PI INDICATOR 8 (GLOVE) ×1
GLOVE BIOGEL PI ORTHO PRO 7.5 (GLOVE) ×2
GLOVE BIOGEL PI ORTHO PRO SZ7 (GLOVE) ×1
GLOVE BIOGEL PI ORTHO PRO SZ8 (GLOVE) ×1
GLOVE ORTHO TXT STRL SZ7.5 (GLOVE) ×4 IMPLANT
GLOVE PI ORTHO PRO STRL 7.5 (GLOVE) ×2 IMPLANT
GLOVE PI ORTHO PRO STRL SZ7 (GLOVE) ×1 IMPLANT
GLOVE PI ORTHO PRO STRL SZ8 (GLOVE) ×1 IMPLANT
GLOVE SURG ORTHO 8.5 STRL (GLOVE) ×2 IMPLANT
GLOVE SURG SS PI 7.0 STRL IVOR (GLOVE) ×2 IMPLANT
GLOVE SURG SS PI 7.5 STRL IVOR (GLOVE) ×4 IMPLANT
GOWN STRL NON-REIN LRG LVL3 (GOWN DISPOSABLE) IMPLANT
GOWN STRL REIN XL XLG (GOWN DISPOSABLE) ×8 IMPLANT
HANDPIECE INTERPULSE COAX TIP (DISPOSABLE) ×1
KIT BASIN OR (CUSTOM PROCEDURE TRAY) ×2 IMPLANT
KIT ROOM TURNOVER OR (KITS) ×2 IMPLANT
MANIFOLD NEPTUNE II (INSTRUMENTS) ×2 IMPLANT
NDL SUT 6 .5 CRC .975X.05 MAYO (NEEDLE) IMPLANT
NEEDLE 1/2 CIR MAYO (NEEDLE) IMPLANT
NEEDLE HYPO 25GX1X1/2 BEV (NEEDLE) ×2 IMPLANT
NEEDLE MAYO TAPER (NEEDLE)
NS IRRIG 1000ML POUR BTL (IV SOLUTION) ×2 IMPLANT
PACK SHOULDER (CUSTOM PROCEDURE TRAY) ×2 IMPLANT
PAD ARMBOARD 7.5X6 YLW CONV (MISCELLANEOUS) ×4 IMPLANT
SET HNDPC FAN SPRY TIP SCT (DISPOSABLE) ×1 IMPLANT
SLING ARM IMMOBILIZER LRG (SOFTGOODS) IMPLANT
SLING ARM IMMOBILIZER MED (SOFTGOODS) ×2 IMPLANT
SPACER 42 PLUS 6 (Orthopedic Implant) ×2 IMPLANT
SPONGE GAUZE 4X4 12PLY (GAUZE/BANDAGES/DRESSINGS) ×2 IMPLANT
SPONGE LAP 18X18 X RAY DECT (DISPOSABLE) ×2 IMPLANT
SPONGE LAP 4X18 X RAY DECT (DISPOSABLE) ×2 IMPLANT
STRIP CLOSURE SKIN 1/2X4 (GAUZE/BANDAGES/DRESSINGS) ×2 IMPLANT
SUCTION FRAZIER TIP 10 FR DISP (SUCTIONS) ×2 IMPLANT
SUT ETHIBOND NAB CT1 #1 30IN (SUTURE) ×2 IMPLANT
SUT FIBERWIRE #2 38 T-5 BLUE (SUTURE) ×4
SUT MNCRL AB 4-0 PS2 18 (SUTURE) ×2 IMPLANT
SUT VIC AB 0 CT1 27 (SUTURE) ×1
SUT VIC AB 0 CT1 27XBRD ANBCTR (SUTURE) ×1 IMPLANT
SUT VIC AB 2-0 CT1 27 (SUTURE) ×1
SUT VIC AB 2-0 CT1 TAPERPNT 27 (SUTURE) ×1 IMPLANT
SUT VICRYL AB 2 0 TIES (SUTURE) IMPLANT
SUTURE FIBERWR #2 38 T-5 BLUE (SUTURE) ×2 IMPLANT
SYR CONTROL 10ML LL (SYRINGE) ×2 IMPLANT
SYR TOOMEY 50ML (SYRINGE) ×2 IMPLANT
SYRINGE 10CC LL (SYRINGE) ×2 IMPLANT
TOWEL OR 17X24 6PK STRL BLUE (TOWEL DISPOSABLE) ×2 IMPLANT
TOWEL OR 17X26 10 PK STRL BLUE (TOWEL DISPOSABLE) ×2 IMPLANT
TOWER CARTRIDGE SMART MIX (DISPOSABLE) IMPLANT
TRAY FOLEY CATH 14FR (SET/KITS/TRAYS/PACK) ×2 IMPLANT
TUBE CONNECTING 12X1/4 (SUCTIONS) ×2 IMPLANT
WATER STERILE IRR 1000ML POUR (IV SOLUTION) IMPLANT
YANKAUER SUCT BULB TIP NO VENT (SUCTIONS) ×2 IMPLANT

## 2011-08-16 NOTE — Anesthesia Postprocedure Evaluation (Signed)
Anesthesia Post Note  Patient: [name]  Procedure(s) Performed: Procedure(s) (LRB): TOTAL SHOULDER REVISION (Right)  Anesthesia type: general  Patient location: PACU  Post pain: Pain level controlled  Post assessment: Patient's Cardiovascular Status Stable  Last Vitals:  Filed Vitals:   08/16/11 1104  BP: 121/61  Pulse: 67  Temp:   Resp: 17    Post vital signs: Reviewed and stable  Level of consciousness: sedated  Complications: No apparent anesthesia complications

## 2011-08-16 NOTE — Transfer of Care (Signed)
Immediate Anesthesia Transfer of Care Note  Patient: [name]  Procedure(s) Performed: Procedure(s) (LRB): TOTAL SHOULDER REVISION (Right)  Patient Location: PACU  Anesthesia Type: General  Level of Consciousness: awake, alert  and oriented  Airway & Oxygen Therapy: Patient Spontanous Breathing and Patient connected to nasal cannula oxygen  Post-op Assessment: Report given to PACU RN, Post -op Vital signs reviewed and stable and Patient moving all extremities X 4  Post vital signs: Reviewed and stable  Complications: No apparent anesthesia complications

## 2011-08-16 NOTE — Anesthesia Procedure Notes (Addendum)
Anesthesia Regional Block:  Interscalene brachial plexus block  Pre-Anesthetic Checklist: ,, timeout performed, Correct Patient, Correct Site, Correct Laterality, Correct Procedure,, site marked, risks and benefits discussed, Surgical consent,  Pre-op evaluation,  At surgeon's request and post-op pain management  Laterality: Right  Prep: chloraprep       Needles:  Injection technique: Single-shot  Needle Type: Echogenic Stimulator Needle     Needle Length: 5cm 5 cm Needle Gauge: 22 and 22 G    Additional Needles:  Procedures: ultrasound guided and nerve stimulator Interscalene brachial plexus block  Nerve Stimulator or Paresthesia:  Response: bicep contraction, 0.45 mA,   Additional Responses:   Narrative:  Start time: 08/16/2011 7:01 AM End time: 08/16/2011 7:11 AM Injection made incrementally with aspirations every 5 mL.  Performed by: Personally  Anesthesiologist: J. Tamela Gammon, MD  Additional Notes: Functioning IV was confirmed and monitors applied.  A 52m 22ga echogenic arrow stimulator was used. Sterile prep and drape,hand hygiene and sterile gloves were used.Ultrasound guidance: relevent anatomy identified, needle position confirmed, local anesthetic spread visualized around nerve(s)., vascular puncture avoided.  Image printed for medical record.  Negative aspiration and negative test dose prior to incremental administration of local anesthetic. The patient tolerated the procedure well.  Interscalene brachial plexus block Procedure Name: Intubation Date/Time: 08/16/2011 7:45 AM Performed by: MNeldon NewportPre-anesthesia Checklist: Patient identified, Timeout performed, Emergency Drugs available, Suction available and Patient being monitored Patient Re-evaluated:Patient Re-evaluated prior to inductionOxygen Delivery Method: Circle system utilized Preoxygenation: Pre-oxygenation with 100% oxygen Intubation Type: IV induction Ventilation: Mask ventilation without  difficulty Laryngoscope Size: Mac and 3 Grade View: Grade I Tube type: Oral Tube size: 7.5 mm Number of attempts: 1 Placement Confirmation: ETT inserted through vocal cords under direct vision,  breath sounds checked- equal and bilateral,  positive ETCO2 and CO2 detector Secured at: 22 cm Tube secured with: Tape Dental Injury: Teeth and Oropharynx as per pre-operative assessment

## 2011-08-16 NOTE — Progress Notes (Signed)
Right Shoulder XRAY taken at bedside.

## 2011-08-16 NOTE — Op Note (Signed)
[name], [name] NO.:  [id number]  MEDICAL RECORD NO.:  [id number]  LOCATION:  [facility]                         FACILITY:  [facility]  PHYSICIAN:  Doran Heater. Veverly Fells, M.D. DATE OF BIRTH:  17-Dec-1945  DATE OF PROCEDURE:  08/16/2011 DATE OF DISCHARGE:                              OPERATIVE REPORT   PREOPERATIVE DIAGNOSES:  Right shoulder pain and concern for implant failure, status post right shoulder reverse total shoulder arthroplasty.  POSTOPERATIVE DIAGNOSES:  Right shoulder pain, likely secondary to implant impingement and foreign body reaction, right shoulder.  PROCEDURE PERFORMED:  Right shoulder arthrotomy with removal of polyethylene, removal of impinging bone lesion off the inferior glenoid and replacement of the glenoid sphere and polyethylene right reverse total shoulder.  ATTENDING SURGEON:  Doran Heater. Veverly Fells, M.D.  ASSISTANT:  Abbott Pao. Doren Custard, P.A.  ANESTHESIA:  General anesthesia was used plus interscalene block.  ESTIMATED BLOOD LOSS:  Minimal.  FLUID REPLACEMENT:  1500 mL crystalloid.  URINE OUTPUT:  250 mL.  INSTRUMENT COUNTS:  Correct.  COMPLICATIONS:  No complications.  Perioperative antibiotics were given.  INDICATIONS:  The patient is a 66 year old female, status post reverse total shoulder arthroplasty back in 2010.  The patient did well for several years and then began having pain about 9 months ago in her right shoulder.  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  The patient agreed and  informed consent obtained.  DESCRIPTION OF PROCEDURE:  After an adequate level of anesthesia was achieved, the patient was positioned in modified beachchair position. Right shoulder was sterilely prepped and draped in usual manner.  Time- out called.  After sterile prep and drape, we entered the shoulder using standard deltopectoral approach starting at coracoid process extending down to the anterior humerus.  We found the deltopectoral interval developed that, identified the conjoined tendon, freed up the pectoralis off the conjoin superficially on the deep side, we had to pull the conjoined tendon and muscle off the underlying humerus.  There was noted to be fluid within the capsule. We opened that up, there was some fluid off sent it for stat Gram stain and culture as well as assessment for polyethylene wear debris and metallic wear debris that came back during surgery.  The Gram stain showed a few monocytes but no PMNs and no organisms.  We then did a capsulectomy, we noted there to be normal- appearing polyethylene.  The shoulder itself was just a touch loose but was not unstable.  There was clear evidence of exposed hydroxyapatite proximal humeral implant.  The bone around the stem itself seemed to be hard, no signs of any active infection.  We did go ahead and dislocate the shoulder, removed the poly, the stem was stable.  We checked the tightness of the metaphyseal to stem connection  that was tightened with the screwdriver and the stem did not rotate in the canal.  We then went ahead and removed the 38 eccentric glenoid sphere using screwdriver, and there was some mucinous material but no infectious findings underneath that.  We did a 360 degree dissection careful to protect the axillary nerve during the entire procedure.  We removed the piece of bone that was inferior to the glenoid that likely was impinging at least against the poly possibly against the metal and that it seemed  to maybe formed in the triceps tendon area.  We were careful to tease that away from surrounding tissue.  We basically had the scapular neck skeletonized, and the position of the Metaglene was perfect at the inferior margin of the scapula.  The screws were checked for tightness, there were all secured, the implant was secured and stable.  We then went and placed a 42 eccentric glenoid sphere in place.  We pulse irrigated prior to doing that with 3 L normal saline irrigation to clean the shoulder out, checked the nerve again with a 42 eccentric glenoid sphere in place, and then trialed with first +3 and +6 poly in light of +6 better, we removed that, irrigated again and placed the +6 polyethylene.  This is a 42+ 6 and reduced the shoulder.  We are happy with the tension on the conjoin tendon, very minimal sulcus gapping present and no gapping with external rotation, it was completely clear.  I put the patient's arm at her side of any impingement.  We thoroughly irrigated, closed the deltopectoral interval with #1 Ethibond suture followed by 2-0 Vicryl for subcutaneous closure, 4-0 Monocryl for skin.  Steri-Strips applied followed by sterile dressing.  The patient tolerated the surgery well.     Doran Heater. Veverly Fells, M.D.     SRN/MEDQ  D:  08/16/2011  T:  08/16/2011  Job:  [id number]

## 2011-08-16 NOTE — Anesthesia Preprocedure Evaluation (Addendum)
Anesthesia Evaluation  Patient identified by MRN, date of birth, ID band Patient awake    Reviewed: Allergy & Precautions, H&P , NPO status , Patient's Chart, lab work & pertinent test results  Airway Mallampati: II TM Distance: >3 FB Neck ROM: Full    Dental  (+) Teeth Intact and Dental Advisory Given   Pulmonary asthma ,  clear to auscultation  Pulmonary exam normal       Cardiovascular hypertension,     Neuro/Psych Anxiety Depression Negative Neurological ROS     GI/Hepatic Neg liver ROS, PUD, GERD-  ,  Endo/Other  Hypothyroidism   Renal/GU negative Renal ROS     Musculoskeletal   Abdominal   Peds  Hematology   Anesthesia Other Findings   Reproductive/Obstetrics                          Anesthesia Physical Anesthesia Plan  ASA: II  Anesthesia Plan: General   Post-op Pain Management:    Induction: Intravenous  Airway Management Planned: Oral ETT  Additional Equipment:   Intra-op Plan:   Post-operative Plan: Extubation in OR  Informed Consent: I have reviewed the patients History and Physical, chart, labs and discussed the procedure including the risks, benefits and alternatives for the proposed anesthesia with the patient or authorized representative who has indicated his/her understanding and acceptance.   Dental advisory given  Plan Discussed with: CRNA, Anesthesiologist and Surgeon  Anesthesia Plan Comments:         Anesthesia Quick Evaluation

## 2011-08-16 NOTE — Interval H&P Note (Signed)
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 After consideration of risks, benefits and other options for treatment, the patient has consented to  Procedure(s) (LRB): TOTAL SHOULDER REVISION (Right) as a surgical intervention .  The patients' history has been reviewed, patient examined, no change in status, stable for surgery.  I have reviewed the patients' chart and labs.  Questions were answered to the patient's satisfaction.     Dyanne Yorks,STEVEN R

## 2011-08-16 NOTE — Preoperative (Signed)
Beta Blockers   Reason not to administer Beta Blockers:Not Applicable 

## 2011-08-16 NOTE — Brief Op Note (Signed)
08/16/2011  9:45 AM  PATIENT:  [name]  66 y.o. 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  POST-OPERATIVE DIAGNOSIS:  right shoulder pain/impingement/osteolysis s/p reverse total shoulder arthroplasty  PROCEDURE:  Procedure(s) (LRB): TOTAL SHOULDER REVISION (Right), removal of glenosphere and polyethylene, removal of inferior heterotopic bone and capsulectomy  SURGEON:  Augustin Schooling, MD- Primary  PHYSICIAN ASSISTANT:   ASSISTANTS: Ventura Bruns, PA-C   ANESTHESIA:   regional and general  EBL:  Total I/O In: 1600 [I.V.:1600] Out: 525 [Urine:425; Blood:100]  BLOOD ADMINISTERED:none  DRAINS: none   LOCAL MEDICATIONS USED:  MARCAINE     SPECIMEN:  No Specimen and Source of Specimen:  3 specimens, synovial fluid, capsule, deposits in inferior capsule  DISPOSITION OF SPECIMEN:  PATHOLOGY  COUNTS:  YES  TOURNIQUET:  * No tourniquets in log *  DICTATION: .Other Dictation: Dictation Number [id number]  PLAN OF CARE: Admit to inpatient   PATIENT DISPOSITION:  PACU - hemodynamically stable.   Delay start of Pharmacological VTE agent (>24hrs) due to surgical blood loss or risk of bleeding: yes

## 2011-08-16 NOTE — Plan of Care (Signed)
Problem: Phase I Progression Outcomes Goal: Voiding-avoid urinary catheter unless indicated Outcome: Not Met (add Reason) Foley catheter in place     

## 2011-08-17 LAB — HEMOGLOBIN AND HEMATOCRIT, BLOOD: HCT: 34.9 % — ABNORMAL LOW (ref 36.0–46.0)

## 2011-08-17 LAB — BASIC METABOLIC PANEL
Calcium: 8.7 mg/dL (ref 8.4–10.5)
GFR calc non Af Amer: 87 mL/min — ABNORMAL LOW (ref >90)
Glucose, Bld: 96 mg/dL (ref 70–99)
Sodium: 139 mEq/L (ref 135–145)

## 2011-08-17 MED ORDER — METHOCARBAMOL 500 MG PO TABS: 500.0000 mg | ORAL_TABLET | Freq: Three times a day (TID) | ORAL | Status: AC | PRN

## 2011-08-17 MED ORDER — DIPHENHYDRAMINE HCL 50 MG/ML IJ SOLN
25.0000 mg | Freq: Four times a day (QID) | INTRAMUSCULAR | Status: DC | PRN
  Administered 2011-08-17: 25 mg via INTRAVENOUS

## 2011-08-17 MED ORDER — DIPHENHYDRAMINE HCL 25 MG PO CAPS: 25.0000 mg | ORAL_CAPSULE | Freq: Four times a day (QID) | ORAL | Status: DC | PRN

## 2011-08-17 MED ORDER — OXYCODONE-ACETAMINOPHEN 5-325 MG PO TABS: 1.0000 | ORAL_TABLET | ORAL | Status: AC | PRN

## 2011-08-17 MED ORDER — DIPHENHYDRAMINE HCL 50 MG/ML IJ SOLN
INTRAMUSCULAR | Status: AC
  Filled 2011-08-17: qty 1

## 2011-08-17 NOTE — Discharge Summary (Signed)
Physician Discharge Summary  Patient ID: [name] MRN: [id number] DOB/AGE: 1946/03/06 66 y.o.  Admit date: 08/16/2011 Discharge date: 08/17/2011  Admission Diagnoses:  Shoulder pain  Discharge Diagnoses:  Same   Surgeries: Procedure(s): TOTAL SHOULDER REVISION on 08/16/2011   Consultants: OT  Discharged Condition: Stable  Hospital Course: [name] is an 66 y.o. 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  They were brought to the operating room on 08/16/2011 and underwent the above named procedures.    The patient had an uncomplicated hospital course and was stable for discharge.  Recent vital signs:  Filed Vitals:   08/17/11 0603  BP: 105/56  Pulse: 71  Temp: 97.7 F (36.5 C)  Resp: 18    Recent laboratory studies:  Results for orders placed during the hospital encounter of 08/16/11  BODY FLUID CULTURE      Component Value Range   Specimen Description SYNOVIAL FLUID SHOULDER RIGHT     Special Requests RIGHT SHOULDER SYNOVIAL FLUID     Gram Stain       Value: RARE WBC PRESENT, PREDOMINANTLY MONONUCLEAR     NO ORGANISMS SEEN     CALLED TO OR 08/16/11 0927 BY K SCHULTZ Performed at [facility]   Culture PENDING     Report Status PENDING    GRAM STAIN      Component Value Range   Specimen Description SYNOVIAL FLUID SHOULDER RIGHT     Special Requests RIGHT SHOULDER SYNOVIAL FLUID     Gram Stain       Value: RARE WBC PRESENT, PREDOMINANTLY MONONUCLEAR     NO ORGANISMS SEEN     CALLED TO OR 08/16/11 0927 BY K SCHULTZ   Report Status 08/16/2011 FINAL      Discharge Medications:   Medication List  As of 08/17/2011  6:49 AM   ASK your doctor about these medications         ALPRAZolam 0.5 MG tablet   Commonly known as: XANAX   Take 0.5 mg by mouth 3 (three) times daily as needed.      buPROPion 300  MG 24 hr tablet   Commonly known as: WELLBUTRIN XL   TAKE ONE TABLET EACH DAY      diphenoxylate-atropine 2.5-0.025 MG per tablet   Commonly known as: LOMOTIL   Take 1 tablet by mouth 4 (four) times daily as needed.      FLUoxetine 20 MG capsule   Commonly known as: PROZAC   Take 40 mg by mouth daily.      fluticasone 50 MCG/ACT nasal spray   Commonly known as: FLONASE   Place 2 sprays into the nose daily.      gabapentin 300 MG capsule   Commonly known as: NEURONTIN   Take 1 capsule (300 mg total) by mouth 3 (three) times daily.      hyoscyamine 0.125 MG SL tablet   Commonly known as: LEVSIN SL   Place 0.125 mg under the tongue as needed. 6 times a month      ketoprofen 75 MG capsule   Commonly known as: ORUDIS   Take 75 mg by mouth 3 (three) times daily.      lamoTRIgine 200 MG tablet   Commonly known as: LAMICTAL   Take 200 mg by mouth daily.      losartan-hydrochlorothiazide 50-12.5 MG  per tablet   Commonly known as: HYZAAR   Take 1 tablet by mouth daily.      Mesalamine 800 MG Tbec   Take 1 tablet by mouth daily.      methylphenidate 36 MG CR tablet   Commonly known as: CONCERTA   Take 1 tablet (36 mg total) by mouth every morning. Fill on or after 01/05/2011. Brand name only      metoCLOPramide 5 MG tablet   Commonly known as: REGLAN   Take 5 mg by mouth every 6 (six) hours as needed.      pantoprazole 40 MG tablet   Commonly known as: PROTONIX   TAKE ONE TABLET TWICE DAILY      pirbuterol 200 MCG/INH inhaler   Commonly known as: MAXAIR   Inhale 2 puffs into the lungs as needed. As needed for asthma/allergies.      promethazine 12.5 MG tablet   Commonly known as: PHENERGAN   Take by mouth every 6 (six) hours as needed.      simvastatin 20 MG tablet   Commonly known as: ZOCOR   Take 20 mg by mouth every evening.      SYNTHROID 100 MCG tablet   Generic drug: levothyroxine   TAKE ONE TABLET EACH DAY      zolpidem 10 MG tablet   Commonly known as:  AMBIEN   Take 10 mg by mouth at bedtime as needed. As needed for sleep.            Diagnostic Studies: Dg Chest 2 View  08/09/2011  *RADIOLOGY REPORT*  Clinical Data: Revision right shoulder arthroplasty.  Osteolysis around the right shoulder arthroplasty.  CHEST - 2 VIEW  Comparison: None.  Findings: Subsegmental atelectasis is present at the right lung base.  There is no airspace disease or effusion.  Cardiopericardial silhouette appears within normal limits.  Reverse right shoulder hemiarthroplasty is present.  This is only partially visualized.  The humeral component is not well seen. Cholecystectomy clips are present.  IMPRESSION: No acute cardiopulmonary disease.  Mild subsegmental atelectasis and / or scarring at the right lung base is stable compared to prior.  Original Report Authenticated By: Dereck Ligas, M.D.   Dg Shoulder 1v Right  08/16/2011  *RADIOLOGY REPORT*  Clinical Data: Postop revision of shoulder arthroplasty  RIGHT SHOULDER - 1 VIEW  Comparison: Portable right shoulder film of 11/25/2008  Findings: The prosthetic components of the right shoulder replacement are in good position on the portable view obtained.  No acute bony abnormality is seen.  IMPRESSION: Right shoulder replacement good position on single portable view.  Original Report Authenticated By: Joretta Bachelor, M.D.    Disposition: home, recommended ice to the shoulder and sling wear while up and around.  Ms Repetto was instructed to use the shoulder for ADLs but to minimize WB with the arm.  She will follow up with me in 2 weeks in the office.  Percocet and Robaxin scripts given.  Discharge Orders    Future Appointments: Provider: Department: Dept Phone: Center:   09/04/2011 10:00 AM Scarlette Shorts, MD [facility] Office [phone] [facility]      Follow-up Information    Follow up with Rozlyn Yerby,STEVEN R, MD. Schedule an appointment as soon as possible for a visit in 2 weeks. [phone])    Contact information:     [facility] [address], [place] [phone]           Signed: Esmond Plants  R 08/17/2011, 6:49 AM

## 2011-08-17 NOTE — Progress Notes (Signed)
Occupational Therapy Evaluation Patient Details Name: [name] MRN: [id number] DOB: 1946/05/20 Today's Date: 08/17/2011  Problem List:  Patient Active Problem List  Diagnoses  . HYPOTHYROIDISM  . HYPERLIPIDEMIA  . ANXIETY  . DEPRESSION  . ATTENTION DEFICIT DISORDER, ADULT  . HYPERTENSION, ESSENTIAL NOS  . ALLERGIC RHINITIS CAUSE UNSPECIFIED  . ASTHMA  . DYSPEPSIA, CHRONIC  . IRRITABLE BOWEL SYNDROME  . OSTEOARTHRITIS  . ABDOMINAL PAIN, CHRONIC  . MITRAL VALVE PROLAPSE, HX OF  . OTHER DYSPHAGIA  . GERD  . DIVERTICULOSIS-COLON  . DIARRHEA  . ATAXIA    Past Medical History:  Past Medical History  Diagnosis Date  . Anxiety   . Depression   . Thyroid disease   . Allergy   . GERD (gastroesophageal reflux disease)   . IBS (irritable bowel syndrome)   . Ulcerative colitis   . Attention deficit disorder without mention of hyperactivity   . Unspecified asthma   . Osteoarthrosis, unspecified whether generalized or localized, unspecified site   . Unspecified essential hypertension   . Other and unspecified hyperlipidemia    Past Surgical History:  Past Surgical History  Procedure Date  . Cholecystectomy   . Colonoscopy   . Upper gastrointestinal endoscopy   . Back surgery     fluid removed from between disks  . Shoulder surgery     right shoulder 3x   . Shoulder replscement   . Toe surgery     right big toe  . Cystectomy     left under arm    OT Assessment/Plan/Recommendation OT Assessment Clinical Impression Statement: 66 yo s/p  R shoulder  total  revision. All education regarding HEP for RUE A/AA/PROM completed. Pt given handout. PT allowed to use RUE as needed for ADL without weightbearing via RUE.All excercises, in addition to positioning, use of ice and sling mgnt completed with pt. OT Recommendation/Assessment: All further OT needs can be met in the next venue of care OT Problem List: Impaired UE functional use;Decreased strength;Decreased range of  motion OT Therapy Diagnosis : Generalized weakness OT Recommendation Follow Up Recommendations: Outpatient OT;Other (comment) (as needed. TBA at next MD visit) Equipment Recommended: None recommended by OT Individuals Consulted Consulted and Agree with Results and Recommendations: Patient OT Goals Acute Rehab OT Goals OT Goal Formulation:  (eval only)  OT Evaluation Precautions/Restrictions  Precautions Precautions: Shoulder Precaution Booklet Issued: Yes (comment) Required Braces or Orthoses: Yes Other Brace/Splint: sling for comfort Restrictions Weight Bearing Restrictions: Yes RUE Weight Bearing: Non weight bearing Prior Functioning Home Living Lives With: Spouse Type of Home: House Home Layout: One level Home Access: Stairs to enter [employer] of Steps: 2 Bathroom Shower/Tub: Tub/shower unit Prior Function Level of Independence: Independent with basic ADLs;Independent with homemaking with ambulation;Independent with gait;Independent with transfers Able to Take Stairs?: Yes Driving: Yes Vocation: Retired ADL ADL Eating/Feeding: Simulated;Modified independent Where Assessed - Eating/Feeding: Edge of bed Grooming: Simulated;Supervision/safety Where Assessed - Grooming: Standing at sink Upper Body Bathing: Performed;Supervision/safety Where Assessed - Upper Body Bathing: Sitting, chair Lower Body Bathing: Simulated;Set up Where Assessed - Lower Body Bathing: Sit to stand from chair Upper Body Dressing: Simulated;Supervision/safety;Set up Where Assessed - Upper Body Dressing: Sitting, chair Lower Body Dressing: Simulated;Set up Where Assessed - Lower Body Dressing: Sit to stand from chair Toilet Transfer: Performed;Supervision/safety Toilet Transfer Method: Counselling psychologist: Regular height toilet Toileting - Clothing Manipulation: Simulated;Supervision/safety Where Assessed - Toileting Clothing Manipulation: Standing Toileting -  Hygiene: Simulated;Independent Where Assessed - Toileting Hygiene: Standing  Tub/Shower Transfer: Not assessed Ambulation Related to ADLs: supervision ADL Comments: Pt able to use RUE to assist with ADL. Cues to not pull/push or lift weight with RUE. Discussee importance to rest RUE and to not overuse RUE. Vision/Perception  Vision - History Baseline Vision: Wears glasses all the time Cognition Cognition Arousal/Alertness: Awake/alert Overall Cognitive Status: Appears within functional limits for tasks assessed Orientation Level: Oriented X4 Sensation/Coordination Sensation Light Touch: Appears Intact Proprioception: Appears Intact Coordination Gross Motor Movements are Fluid and Coordinated: No Fine Motor Movements are Fluid and Coordinated: Yes Coordination and Movement Description: dur to surgical limitations and positioning. Extremity Assessment RUE Assessment RUE Assessment: Exceptions to [facility] RUE AROM (degrees) Overall AROM Right Upper Extremity: Deficits;Due to precautions;Due to premorbid status LUE Assessment LUE Assessment: Within Functional Limits Mobility  Bed Mobility Bed Mobility: Yes (mod I) Transfers Transfers: Yes (mod I) Exercises Shoulder Exercises Pendulum Exercise: Self ROM;Right;10 reps;Supine;Seated Shoulder Flexion: AAROM;Right;10 reps;Supine Shoulder ABduction: AAROM;10 reps;Seated Shoulder External Rotation: AROM;AAROM;Self ROM;Right;10 reps;Supine;Seated Elbow Flexion: AROM;10 reps;Seated Elbow Extension: AROM;10 reps;Seated Wrist Flexion: AROM;Right;10 reps Wrist Extension: AROM;10 reps;Seated Digit Composite Flexion: AROM;10 reps;Right;Seated Neck Flexion: AROM;10 reps;Seated Neck Extension: PROM;10 reps;Seated Neck Lateral Flexion - Right: AROM;10 reps;Seated Neck Lateral Flexion - Left: AROM;10 reps;Seated End of Session OT - End of Session Equipment Utilized During Treatment: Gait belt Activity Tolerance: Patient tolerated treatment  well Patient left: in chair;with call bell in reach;with family/visitor present Nurse Communication: Mobility status for transfers General Behavior During Session: [facility] for tasks performed Cognition: [facility] for tasks performed   [employer] 08/17/2011, 5:01 PM  [facility], OTR/L  [phone] 08/17/2011

## 2011-08-17 NOTE — Consult Note (Signed)
Orthopedics Progress Note  Subjective: I feel better this morning, rough night after block wore off.  Objective:  Filed Vitals:   08/17/11 0603  BP: 105/56  Pulse: 71  Temp: 97.7 F (36.5 C)  Resp: 18    General: Awake and alert  Musculoskeletal: shoulder dressing CDI Neurovascularly intact  Lab Results  Component Value Date   WBC 9.3 08/09/2011   HGB 10.7* 08/17/2011   HCT 34.9* 08/17/2011   MCV 82.6 08/09/2011   PLT 276 08/09/2011       Component Value Date/Time   NA 142 08/09/2011 1048   K 4.5 08/09/2011 1048   CL 105 08/09/2011 1048   CO2 28 08/09/2011 1048   GLUCOSE 92 08/09/2011 1048   BUN 15 08/09/2011 1048   CREATININE 0.83 08/09/2011 1048   CALCIUM 9.4 08/09/2011 1048   GFRNONAA 72* 08/09/2011 1048   GFRAA 84* 08/09/2011 1048    Lab Results  Component Value Date   INR 1.0 11/25/2008   INR 1.0 01/15/2008    Assessment/Plan: POD #1 s/p Procedure(s): TOTAL SHOULDER REVISION Doing very well.   OT today, then mobilization with assistance. Rx on chart, D/C summary done D/C today or tomorrow based upon how she does with PT/OT. Thanks!  Doran Heater. Veverly Fells, MD 08/17/2011 7:26 AM

## 2011-08-18 NOTE — Progress Notes (Signed)
Subjective: 2 Days Post-Op Procedure(s) (LRB): TOTAL SHOULDER REVISION (Right) Patient reports pain as mild.   Denies CP or SOB.  Voiding without difficulty. Positive flatus. Objective: Vital signs in last 24 hours: Temp:  [97.2 F (36.2 C)-98.6 F (37 C)] 98.6 F (37 C) (02/24 0620) Pulse Rate:  [71-76] 71  (02/24 0620) Resp:  [16-18] 16  (02/24 0620) BP: (113-115)/(62-69) 113/64 mmHg (02/24 0620) SpO2:  [96 %-97 %] 96 % (02/24 0620)  Intake/Output from previous day: 02/23 0701 - 02/24 0700 In: 600 [P.O.:600] Out: -  Intake/Output this shift:     Basename 08/17/11 0700  HGB 10.7*    Basename 08/17/11 0700  WBC --  RBC --  HCT 34.9*  PLT --    Basename 08/17/11 0700  NA 139  K 3.7  CL 106  CO2 27  BUN 8  CREATININE 0.76  GLUCOSE 96  CALCIUM 8.7   No results found for this basename: LABPT:2,INR:2 in the last 72 hours  Neurologically intact Neurovascular intact Sensation intact distally Incision: dressing C/D/I Compartment soft  Assessment/Plan: 2 Days Post-Op Procedure(s) (LRB): TOTAL SHOULDER REVISION (Right) Advance diet Up with therapy D/c home today  Tammy Castilleja R. 08/18/2011, 9:16 AM

## 2011-08-18 NOTE — Discharge Instructions (Signed)
Please ice the shoulder constantly. Use pillow behind the elbow to keep the arm across the waist (hugging position) when out of sling in the home.  Use sling while out of the home or walking around.  May get wound wet in one week, sponge bathe until then.  May use the arm for light activity in front of you.  No heavy pushing , pulling , or lifting.  F/u in two weeks with Dr Veverly Fells.  [phone]

## 2011-08-19 LAB — TISSUE CULTURE
Culture: NO GROWTH
Culture: NO GROWTH
Gram Stain: NONE SEEN
Gram Stain: NONE SEEN

## 2011-08-19 LAB — BODY FLUID CULTURE

## 2011-08-21 LAB — ANAEROBIC CULTURE

## 2011-08-28 DIAGNOSIS — M25519 Pain in unspecified shoulder: Secondary | ICD-10-CM | POA: Diagnosis not present

## 2011-08-30 LAB — ANAEROBIC CULTURE

## 2011-08-31 LAB — ANAEROBIC CULTURE: Culture: NO GROWTH

## 2011-09-04 ENCOUNTER — Ambulatory Visit (INDEPENDENT_AMBULATORY_CARE_PROVIDER_SITE_OTHER): Payer: Medicare Other | Admitting: Internal Medicine

## 2011-09-04 ENCOUNTER — Encounter: Payer: Self-pay | Admitting: Internal Medicine

## 2011-09-04 DIAGNOSIS — D649 Anemia, unspecified: Secondary | ICD-10-CM | POA: Diagnosis not present

## 2011-09-04 DIAGNOSIS — K222 Esophageal obstruction: Secondary | ICD-10-CM

## 2011-09-04 DIAGNOSIS — K219 Gastro-esophageal reflux disease without esophagitis: Secondary | ICD-10-CM

## 2011-09-04 DIAGNOSIS — K5190 Ulcerative colitis, unspecified, without complications: Secondary | ICD-10-CM | POA: Diagnosis not present

## 2011-09-04 MED ORDER — MESALAMINE 800 MG PO TBEC: 1.0000 | DELAYED_RELEASE_TABLET | Freq: Two times a day (BID) | ORAL | Status: DC

## 2011-09-04 NOTE — Patient Instructions (Signed)
We have sent the following medications to your pharmacy for you to pick up at your convenience:  Asacol.  We have also given you some samples to take.
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
## 2011-10-08 DIAGNOSIS — M25519 Pain in unspecified shoulder: Secondary | ICD-10-CM | POA: Diagnosis not present

## 2011-11-01 ENCOUNTER — Telehealth: Payer: Self-pay | Admitting: *Deleted

## 2011-11-01 ENCOUNTER — Ambulatory Visit (INDEPENDENT_AMBULATORY_CARE_PROVIDER_SITE_OTHER): Payer: Medicare Other | Admitting: Internal Medicine

## 2011-11-01 ENCOUNTER — Encounter: Payer: Self-pay | Admitting: Internal Medicine

## 2011-11-01 ENCOUNTER — Other Ambulatory Visit (INDEPENDENT_AMBULATORY_CARE_PROVIDER_SITE_OTHER): Payer: Medicare Other

## 2011-11-01 VITALS — BP 158/90 | HR 88 | Temp 98.2°F | Resp 16

## 2011-11-01 DIAGNOSIS — R5381 Other malaise: Secondary | ICD-10-CM | POA: Diagnosis not present

## 2011-11-01 DIAGNOSIS — R202 Paresthesia of skin: Secondary | ICD-10-CM

## 2011-11-01 DIAGNOSIS — R519 Headache, unspecified: Secondary | ICD-10-CM | POA: Insufficient documentation

## 2011-11-01 DIAGNOSIS — R5383 Other fatigue: Secondary | ICD-10-CM

## 2011-11-01 DIAGNOSIS — R030 Elevated blood-pressure reading, without diagnosis of hypertension: Secondary | ICD-10-CM

## 2011-11-01 DIAGNOSIS — R209 Unspecified disturbances of skin sensation: Secondary | ICD-10-CM

## 2011-11-01 DIAGNOSIS — R51 Headache: Secondary | ICD-10-CM | POA: Insufficient documentation

## 2011-11-01 DIAGNOSIS — M2550 Pain in unspecified joint: Secondary | ICD-10-CM | POA: Diagnosis not present

## 2011-11-01 LAB — URINALYSIS, ROUTINE W REFLEX MICROSCOPIC
Nitrite: NEGATIVE
Specific Gravity, Urine: 1.025 (ref 1.000–1.030)
Urobilinogen, UA: 0.2 (ref 0.0–1.0)
pH: 6 (ref 5.0–8.0)

## 2011-11-01 LAB — CBC WITH DIFFERENTIAL/PLATELET
Basophils Relative: 0.7 % (ref 0.0–3.0)
Eosinophils Absolute: 0.2 10*3/uL (ref 0.0–0.7)
Lymphocytes Relative: 14.4 % (ref 12.0–46.0)
MCHC: 32.3 g/dL (ref 30.0–36.0)
Neutrophils Relative %: 75 % (ref 43.0–77.0)
Platelets: 299 10*3/uL (ref 150.0–400.0)
RBC: 4.93 Mil/uL (ref 3.87–5.11)
WBC: 8.4 10*3/uL (ref 4.5–10.5)

## 2011-11-01 LAB — BASIC METABOLIC PANEL
CO2: 26 mEq/L (ref 19–32)
Calcium: 9.4 mg/dL (ref 8.4–10.5)
Chloride: 105 mEq/L (ref 96–112)
Glucose, Bld: 134 mg/dL — ABNORMAL HIGH (ref 70–99)
Sodium: 141 mEq/L (ref 135–145)

## 2011-11-01 LAB — SEDIMENTATION RATE: Sed Rate: 9 mm/hr (ref 0–22)

## 2011-11-01 LAB — TSH: TSH: 3.74 u[IU]/mL (ref 0.35–5.50)

## 2011-11-01 MED ORDER — OLMESARTAN MEDOXOMIL-HCTZ 20-12.5 MG PO TABS: 1.0000 | ORAL_TABLET | Freq: Every day | ORAL | Status: DC

## 2011-11-01 MED ORDER — BUTALBITAL-ACETAMINOPHEN 50-650 MG PO TABS: 1.0000 | ORAL_TABLET | Freq: Three times a day (TID) | ORAL | Status: DC | PRN

## 2011-11-01 NOTE — Assessment & Plan Note (Signed)
?  tension 5/13 new ? etiol -- viral vs other BUPAP prn Labs

## 2011-11-01 NOTE — Telephone Encounter (Signed)
Left detailed mess informing pt of below.  

## 2011-11-01 NOTE — Telephone Encounter (Signed)
Message copied by Cresenciano Lick on Fri Nov 01, 2011  4:39 PM ------      Message from: Creig Hines      Created: Fri Nov 01, 2011  3:51 PM       Erline Levine, please, inform patient that all labs are OK      Thank you!

## 2011-11-01 NOTE — Assessment & Plan Note (Signed)
Start Benic-HCT if elevated (samples) Labs Dr Linda Hedges in 2 wks

## 2011-11-01 NOTE — Progress Notes (Signed)
  Subjective:    Patient ID: [name], female    DOB: Jan 20, 1946, 66 y.o.   MRN: [id number]  HPI  Walked in c/o elev BP (it was 170/100 earlier at the drug store) and not feeling well x 3-4 d with some HAs, stiff neck and achy joints. No fever.Marland KitchenMarland KitchenNo new meds, tick bites etc  BP Readings from Last 3 Encounters:  11/01/11 158/90  09/04/11 122/78  08/18/11 113/64   Wt Readings from Last 3 Encounters:  09/04/11 149 lb 3.2 oz (67.677 kg)  08/09/11 151 lb 7.3 oz (68.7 kg)  05/21/11 150 lb (68.04 kg)     Review of Systems  Constitutional: Positive for fatigue. Negative for fever and chills.  HENT: Negative for rhinorrhea and sneezing.   Respiratory: Negative for chest tightness.   Gastrointestinal: Negative for abdominal pain.  Genitourinary: Negative for urgency and frequency.  Musculoskeletal: Negative for myalgias.  Neurological: Negative for light-headedness.  Psychiatric/Behavioral: The patient is nervous/anxious.        Objective:   Physical Exam  Constitutional: She appears well-developed. No distress.  HENT:  Head: Normocephalic.  Right Ear: External ear normal.  Left Ear: External ear normal.  Nose: Nose normal.  Mouth/Throat: Oropharynx is clear and moist. No oropharyngeal exudate.  Eyes: Conjunctivae are normal. Pupils are equal, round, and reactive to light. Right eye exhibits no discharge. Left eye exhibits no discharge.  Neck: Normal range of motion. Neck supple. No JVD present. No tracheal deviation present. No thyromegaly present.       supple  Cardiovascular: Normal rate, regular rhythm and normal heart sounds.   Pulmonary/Chest: No stridor. No respiratory distress. She has no wheezes.  Abdominal: Soft. Bowel sounds are normal. She exhibits no distension and no mass. There is no tenderness. There is no rebound and no guarding.  Musculoskeletal: She exhibits no edema and no tenderness.  Lymphadenopathy:    She has no cervical adenopathy.  Neurological:  She displays normal reflexes. No cranial nerve deficit. She exhibits normal muscle tone. Coordination normal.  Skin: No rash noted. No erythema.  Psychiatric: She has a normal mood and affect. Her behavior is normal. Judgment and thought content normal.   Lab Results  Component Value Date   WBC 9.3 08/09/2011   HGB 10.7* 08/17/2011   HCT 34.9* 08/17/2011   PLT 276 08/09/2011   GLUCOSE 96 08/17/2011   CHOL 170 03/29/2009   TRIG 68.0 03/29/2009   HDL 52.10 03/29/2009   LDLDIRECT 182.5 02/02/2008   LDLCALC 104* 03/29/2009   ALT 19 04/19/2011   AST 16 04/19/2011   NA 139 08/17/2011   K 3.7 08/17/2011   CL 106 08/17/2011   CREATININE 0.76 08/17/2011   BUN 8 08/17/2011   CO2 27 08/17/2011   TSH 1.29 04/19/2011   INR 1.0 11/25/2008   HGBA1C 5.7 04/19/2011          Assessment & Plan:

## 2011-11-01 NOTE — Patient Instructions (Signed)
Call if sick Normal <130/85

## 2011-11-01 NOTE — Assessment & Plan Note (Signed)
Labs 5/13 new ? etiol -- viral vs other

## 2011-11-20 ENCOUNTER — Other Ambulatory Visit: Payer: Self-pay | Admitting: Internal Medicine

## 2011-12-17 DIAGNOSIS — F909 Attention-deficit hyperactivity disorder, unspecified type: Secondary | ICD-10-CM | POA: Diagnosis not present

## 2011-12-17 DIAGNOSIS — F064 Anxiety disorder due to known physiological condition: Secondary | ICD-10-CM | POA: Diagnosis not present

## 2011-12-25 DIAGNOSIS — L84 Corns and callosities: Secondary | ICD-10-CM | POA: Diagnosis not present

## 2011-12-25 DIAGNOSIS — M7750 Other enthesopathy of unspecified foot: Secondary | ICD-10-CM | POA: Diagnosis not present

## 2011-12-25 DIAGNOSIS — M2040 Other hammer toe(s) (acquired), unspecified foot: Secondary | ICD-10-CM | POA: Diagnosis not present

## 2012-01-23 ENCOUNTER — Other Ambulatory Visit: Payer: Self-pay | Admitting: Internal Medicine

## 2012-01-30 DIAGNOSIS — S93409A Sprain of unspecified ligament of unspecified ankle, initial encounter: Secondary | ICD-10-CM | POA: Diagnosis not present

## 2012-01-30 DIAGNOSIS — M779 Enthesopathy, unspecified: Secondary | ICD-10-CM | POA: Diagnosis not present

## 2012-02-12 DIAGNOSIS — M7750 Other enthesopathy of unspecified foot: Secondary | ICD-10-CM | POA: Diagnosis not present

## 2012-02-19 DIAGNOSIS — M545 Low back pain: Secondary | ICD-10-CM | POA: Diagnosis not present

## 2012-02-21 ENCOUNTER — Other Ambulatory Visit: Payer: Self-pay | Admitting: Internal Medicine

## 2012-02-26 DIAGNOSIS — M25519 Pain in unspecified shoulder: Secondary | ICD-10-CM | POA: Diagnosis not present

## 2012-03-02 DIAGNOSIS — M25519 Pain in unspecified shoulder: Secondary | ICD-10-CM | POA: Diagnosis not present

## 2012-03-05 DIAGNOSIS — M25519 Pain in unspecified shoulder: Secondary | ICD-10-CM | POA: Diagnosis not present

## 2012-03-06 DIAGNOSIS — M545 Low back pain: Secondary | ICD-10-CM | POA: Diagnosis not present

## 2012-03-06 DIAGNOSIS — M47817 Spondylosis without myelopathy or radiculopathy, lumbosacral region: Secondary | ICD-10-CM | POA: Diagnosis not present

## 2012-03-10 DIAGNOSIS — M25519 Pain in unspecified shoulder: Secondary | ICD-10-CM | POA: Diagnosis not present

## 2012-03-12 DIAGNOSIS — M545 Low back pain: Secondary | ICD-10-CM | POA: Diagnosis not present

## 2012-03-17 DIAGNOSIS — L821 Other seborrheic keratosis: Secondary | ICD-10-CM | POA: Diagnosis not present

## 2012-03-17 DIAGNOSIS — D692 Other nonthrombocytopenic purpura: Secondary | ICD-10-CM | POA: Diagnosis not present

## 2012-03-17 DIAGNOSIS — M545 Low back pain: Secondary | ICD-10-CM | POA: Diagnosis not present

## 2012-03-18 ENCOUNTER — Other Ambulatory Visit: Payer: Self-pay | Admitting: Internal Medicine

## 2012-03-20 DIAGNOSIS — M545 Low back pain: Secondary | ICD-10-CM | POA: Diagnosis not present

## 2012-03-24 DIAGNOSIS — M545 Low back pain: Secondary | ICD-10-CM | POA: Diagnosis not present

## 2012-03-26 ENCOUNTER — Ambulatory Visit (INDEPENDENT_AMBULATORY_CARE_PROVIDER_SITE_OTHER): Payer: Medicare Other | Admitting: Internal Medicine

## 2012-03-26 ENCOUNTER — Encounter: Payer: Self-pay | Admitting: Internal Medicine

## 2012-03-26 VITALS — BP 124/80 | HR 78 | Temp 98.2°F | Resp 16 | Wt 150.0 lb

## 2012-03-26 DIAGNOSIS — M1990 Unspecified osteoarthritis, unspecified site: Secondary | ICD-10-CM

## 2012-03-26 DIAGNOSIS — IMO0002 Reserved for concepts with insufficient information to code with codable children: Secondary | ICD-10-CM

## 2012-03-26 DIAGNOSIS — I10 Essential (primary) hypertension: Secondary | ICD-10-CM

## 2012-03-26 DIAGNOSIS — F988 Other specified behavioral and emotional disorders with onset usually occurring in childhood and adolescence: Secondary | ICD-10-CM

## 2012-03-26 DIAGNOSIS — R279 Unspecified lack of coordination: Secondary | ICD-10-CM | POA: Diagnosis not present

## 2012-03-26 DIAGNOSIS — F329 Major depressive disorder, single episode, unspecified: Secondary | ICD-10-CM

## 2012-03-26 MED ORDER — METHYLPHENIDATE HCL ER (OSM) 27 MG PO TBCR: 27.0000 mg | EXTENDED_RELEASE_TABLET | ORAL | Status: DC

## 2012-03-26 MED ORDER — RAMELTEON 8 MG PO TABS: 8.0000 mg | ORAL_TABLET | Freq: Every day | ORAL | Status: DC

## 2012-03-26 NOTE — Assessment & Plan Note (Signed)
Has had multiple surgeries and has chronic decreased ROM

## 2012-03-26 NOTE — Patient Instructions (Addendum)
Bad back and weakness - known multi-level disk disease L2-S1. If the epidural steroid injections work this is good.  Arms - may have degenerative cervical disk disease as well - keep appointment with Dr. Vertell Limber  Balance/coordination/gait issues - two potential drug issues: gabapentin and ambien. Plan -  Taper off the gabapentin  Taper off the Ambien  A substitute product for sleep - rozerem, a melatonin type drug with fewer potential side effects.  Memory and cognitive function - definitely may be drug related (gaba and ambien). Also may want to discuss drug effects with your psychiatrist. If after tapering off the gaba and ambien, and clearing the med table with your psychiatrist - will arrange for full cognitive evaluation to see if you will benefit from a product like aricept or namenda for memory loss.

## 2012-03-29 DIAGNOSIS — M539 Dorsopathy, unspecified: Secondary | ICD-10-CM | POA: Insufficient documentation

## 2012-03-29 NOTE — Assessment & Plan Note (Signed)
Increasing problem with gait but also with generalized lack of coordination and clumsiness: can't swing a gold club. She feels she can't remember how to do these things and then cannot execute the activity. She has no focal neurologic complaints. Unlikely that this would be a dementia of any kind. Reviewed all medications - regular dosing of gabapentin may be a culprit drug.  Plan D/c gabapentin with a taper.

## 2012-03-29 NOTE — Assessment & Plan Note (Signed)
BP Readings from Last 3 Encounters:  03/26/12 124/80  11/01/11 158/90  09/04/11 122/78   Adequate control on present regimen.  No change in treatment

## 2012-03-29 NOTE — Progress Notes (Signed)
Subjective:    Patient ID: [name], female    DOB: 12/28/1945, 66 y.o.   MRN: [id number]  HPI [name] presents with an array of problems.  She is having problems with coordination and physical function: gait, athletic ability. This has been progressively getting worse so that she cannot play golf and has difficulties with ADLs.  She has chronic DDD lumbar spine for which she has had ESI but she continues to have leg weakness and pain. She is now having bilateral UE discomfort and weakness. She has not been evaluated for cervical DDD.  Mental capacity - she feels that she has had marked loss of memory and cognitive ability. She remains independent in ADLs but feels she is not functioning at her usual level.   Past Medical History  Diagnosis Date  . Anxiety   . Depression   . Thyroid disease   . Allergy   . GERD (gastroesophageal reflux disease)   . IBS (irritable bowel syndrome)   . Ulcerative colitis   . Attention deficit disorder without mention of hyperactivity   . Unspecified asthma   . Osteoarthrosis, unspecified whether generalized or localized, unspecified site   . Unspecified essential hypertension   . Other and unspecified hyperlipidemia   . Diverticulosis    Past Surgical History  Procedure Date  . Cholecystectomy   . Colonoscopy   . Upper gastrointestinal endoscopy   . Back surgery     fluid removed from between disks  . Shoulder surgery     right shoulder 3x   . Total shoulder arthroplasty   . Toe surgery     right big toe  . Cystectomy     left under arm   Family History  Problem Relation Age of Onset  . Hypertension Father   . Colon cancer Father 43  . Arthritis Father   . Heart disease Father     CHF  . Diabetes Neg Hx    History   Social History  . Marital Status: Married    Spouse Name: N/A    Number of Children: 3  . Years of Education: 16   Occupational History  . interior decorator    Social History Main Topics  .  Smoking status: Former Smoker    Quit date: 10/30/1987  . Smokeless tobacco: Never Used  . Alcohol Use: 0.5 oz/week    1 drink(s) per week     once a week  . Drug Use: No  . Sexually Active: Not on file   Other Topics Concern  . Not on file   Social History Narrative   [employer]; post-graduate study [employer].  married '68.  3 children: 2 daughters - '70, '72; 1 son '82; 6 grand-children. Lost her mother 2008, elderly father lives alone-in [place] died in September 03, 2022. owner/operator interior Agricultural consultant business. SO-in good health.  Marriage is a work in progress    Current Outpatient Prescriptions on File Prior to Visit  Medication Sig Dispense Refill  . ACETAMINOPHEN-BUTALBITAL (BUPAP) 50-650 MG TABS Take 1 tablet by mouth 3 (three) times daily as needed.  60 each  0  . ALPRAZolam (XANAX) 0.5 MG tablet Take 0.5 mg by mouth 3 (three) times daily as needed.      . ASACOL HD 800 MG TBEC TAKE ONE TABLET TWICE DAILY  60 each  3  . buPROPion (WELLBUTRIN XL) 300 MG 24 hr tablet TAKE ONE TABLET EACH DAY  30 tablet  6  . diphenoxylate-atropine (LOMOTIL)  2.5-0.025 MG per tablet Take 1 tablet by mouth 4 (four) times daily as needed.      [name] FLUoxetine (PROZAC) 20 MG capsule Take 40 mg by mouth daily.       [name] gabapentin (NEURONTIN) 300 MG capsule TAKE ONE CAPSULE THREE TIMES DAILY  90 capsule  5  . hyoscyamine (LEVSIN SL) 0.125 MG SL tablet Place 0.125 mg under the tongue as needed. 6 times a month       . ketoprofen (ORUDIS) 75 MG capsule TAKE ONE CAPSULE 3 TIMES DAILY          AS NEEDED FOR PAIN  90 capsule  1  . lamoTRIgine (LAMICTAL) 200 MG tablet Take 200 mg by mouth daily.        . metoCLOPramide (REGLAN) 5 MG tablet Take 5 mg by mouth every 6 (six) hours as needed.      . pantoprazole (PROTONIX) 40 MG tablet TAKE ONE TABLET TWICE DAILY  60 tablet  11  . pirbuterol (MAXAIR) 200 MCG/INH inhaler Inhale 2 puffs into the lungs as needed. As needed for asthma/allergies.      . promethazine  (PHENERGAN) 12.5 MG tablet Take by mouth every 6 (six) hours as needed.      [name] SYNTHROID 100 MCG tablet TAKE ONE TABLET EVERY MORNING  30 tablet  4  . zolpidem (AMBIEN) 10 MG tablet Take 10 mg by mouth at bedtime as needed. As needed for sleep.      . methylphenidate (CONCERTA) 27 MG CR tablet Take 1 tablet (27 mg total) by mouth every morning.    0  . ramelteon (ROZEREM) 8 MG tablet Take 1 tablet (8 mg total) by mouth at bedtime.  30 tablet  5  . zolpidem (AMBIEN CR) 12.5 MG CR tablet Take 10 mg by mouth at bedtime as needed.        [name] DISCONTD: losartan-hydrochlorothiazide (HYZAAR) 50-12.5 MG per tablet Take 1 tablet by mouth daily.      [name] DISCONTD: simvastatin (ZOCOR) 20 MG tablet Take 20 mg by mouth every evening.          Review of Systems System review is negative for any constitutional, cardiac, pulmonary, GI or neuro symptoms or complaints other than as described in the HPI.      Objective:   Physical Exam Filed Vitals:   03/26/12 1203  BP: 124/80  Pulse: 78  Temp: 98.2 F (36.8 C)  Resp: 16   Gen'l- WNWD white woman in no acute distress HEENT - unremarkable and normal Cor- RRR Pulm - normal respirations Neuro - normal speech and cognition, CN II-XII normal, MS - able to stand w/o assistance, moves about exam room OK, has normal gait. MAE Psych - worried but at her baseline. Not overly anxious. Able to focus on conversation, describe her symptoms and seems to understand recommendations for treatment       Assessment & Plan:

## 2012-03-29 NOTE — Assessment & Plan Note (Signed)
[name] reports that she continues to have problems c/w ADD and has not done well of stimulants.  Plan Resume concerta once daily - Rx written.

## 2012-03-29 NOTE — Assessment & Plan Note (Signed)
Established history of Lumbar DDD with leg pain/weakness s/p ESI on previous occasions. Still having symptoms c/w multi-level disk disease but also ataxia - see other problem. She is now having pain and weakness in both UE - may have DDD involving cervical spine.  Plan She is already scheduled to see Dr. Vertell Limber about these issues.

## 2012-03-29 NOTE — Assessment & Plan Note (Signed)
Chronic issues. Gabapentin may have been added by psychiatry, she isn't sure.  Plan - med changes as in ataxia problem description  She is concerned about mental function - may be med related or pseudo-dementia with depression.

## 2012-03-31 ENCOUNTER — Telehealth: Payer: Self-pay | Admitting: Internal Medicine

## 2012-03-31 NOTE — Telephone Encounter (Signed)
Patient calling, was given a script for a new sleeping pill.  Pharmacist has just called that it will cost > $200 for a month's supply.  She is asking if there is a cheaper medication to switch too please. Uses [facility] at [phone].

## 2012-04-01 DIAGNOSIS — F064 Anxiety disorder due to known physiological condition: Secondary | ICD-10-CM | POA: Diagnosis not present

## 2012-04-01 DIAGNOSIS — M545 Low back pain: Secondary | ICD-10-CM | POA: Diagnosis not present

## 2012-04-01 NOTE — Telephone Encounter (Signed)
Had stopped sonata (zaplon) for conerns about memory. Before trying another benzodiazepine or non-benzodiazepine please try benadryl 25-50 mg qhs. If this fails let us know - will try lunesta, which may also be pricey

## 2012-04-01 NOTE — Telephone Encounter (Signed)
Patient notified on cell # of message concerning medication for sleep. Work # incorrect in system. Home # no way to leave message.

## 2012-04-02 DIAGNOSIS — M76899 Other specified enthesopathies of unspecified lower limb, excluding foot: Secondary | ICD-10-CM | POA: Diagnosis not present

## 2012-04-09 ENCOUNTER — Other Ambulatory Visit (INDEPENDENT_AMBULATORY_CARE_PROVIDER_SITE_OTHER): Payer: Medicare Other

## 2012-04-09 ENCOUNTER — Telehealth: Payer: Self-pay | Admitting: *Deleted

## 2012-04-09 ENCOUNTER — Other Ambulatory Visit: Payer: Self-pay | Admitting: Internal Medicine

## 2012-04-09 DIAGNOSIS — E039 Hypothyroidism, unspecified: Secondary | ICD-10-CM

## 2012-04-09 NOTE — Telephone Encounter (Signed)
Left message on voice mail of cell # of lab orders have been placed for thyroid test. Also left message on home # with Mr. Silvester  Of this.

## 2012-04-10 ENCOUNTER — Encounter: Payer: Self-pay | Admitting: Internal Medicine

## 2012-04-15 DIAGNOSIS — M545 Low back pain: Secondary | ICD-10-CM | POA: Diagnosis not present

## 2012-04-21 ENCOUNTER — Telehealth: Payer: Self-pay | Admitting: Internal Medicine

## 2012-04-21 NOTE — Telephone Encounter (Signed)
Metaline but in the PM- mornings are tough this week due to large hospital census

## 2012-04-21 NOTE — Telephone Encounter (Signed)
The patient called and is hoping to be worked in sometime this week for general weakness.  Would you like a slot double booked to accommodate her?

## 2012-04-22 ENCOUNTER — Telehealth: Payer: Self-pay | Admitting: Internal Medicine

## 2012-04-22 DIAGNOSIS — R739 Hyperglycemia, unspecified: Secondary | ICD-10-CM

## 2012-04-22 DIAGNOSIS — M76899 Other specified enthesopathies of unspecified lower limb, excluding foot: Secondary | ICD-10-CM | POA: Diagnosis not present

## 2012-04-22 DIAGNOSIS — R5381 Other malaise: Secondary | ICD-10-CM

## 2012-04-22 NOTE — Telephone Encounter (Signed)
The patient is hoping to come in for "extensive labwork".  She was offered an apt, but refused, stating she has been tired and unable to eat.  She is hoping to get lab work done, and then to schedule a follow up apt after the lab work results are back.

## 2012-04-23 NOTE — Telephone Encounter (Signed)
Had lab in Feb and May: blood count, Bmet. Had thyroid labs 10/17 - normal. Have order A1C , hepatic and ANA.

## 2012-04-23 NOTE — Telephone Encounter (Signed)
Patient notified of lab studies ordered for her.

## 2012-04-24 ENCOUNTER — Other Ambulatory Visit (INDEPENDENT_AMBULATORY_CARE_PROVIDER_SITE_OTHER): Payer: Medicare Other

## 2012-04-24 DIAGNOSIS — R7309 Other abnormal glucose: Secondary | ICD-10-CM | POA: Diagnosis not present

## 2012-04-24 DIAGNOSIS — M76899 Other specified enthesopathies of unspecified lower limb, excluding foot: Secondary | ICD-10-CM | POA: Diagnosis not present

## 2012-04-24 DIAGNOSIS — R5381 Other malaise: Secondary | ICD-10-CM

## 2012-04-24 DIAGNOSIS — R5383 Other fatigue: Secondary | ICD-10-CM | POA: Diagnosis not present

## 2012-04-24 DIAGNOSIS — R739 Hyperglycemia, unspecified: Secondary | ICD-10-CM

## 2012-04-24 LAB — HEMOGLOBIN A1C: Hgb A1c MFr Bld: 5.5 % (ref 4.6–6.5)

## 2012-04-24 LAB — HEPATIC FUNCTION PANEL
Bilirubin, Direct: 0.1 mg/dL (ref 0.0–0.3)
Total Bilirubin: 0.4 mg/dL (ref 0.3–1.2)

## 2012-04-27 ENCOUNTER — Telehealth: Payer: Self-pay | Admitting: *Deleted

## 2012-04-27 NOTE — Telephone Encounter (Signed)
LEFT MESSAGE ON PATIENT HOME AND CELL VOICE MAIL OF NORMAL A1C , NO EVIDENCE OF DIABETES. TO CALL IF ANY QUESTIONS.

## 2012-04-27 NOTE — Telephone Encounter (Signed)
Message copied by Elnora Morrison on Mon Apr 27, 2012  9:22 AM ------      Message from: Anselm Jungling      Created: Mon Apr 27, 2012  9:13 AM                   ----- Message -----         From: Jacques Navy, MD         Sent: 04/27/2012   8:49 AM           To: Anselm Jungling, CMA            Please call patient - A1C is normal, no evidence of diabetes

## 2012-04-28 DIAGNOSIS — M76899 Other specified enthesopathies of unspecified lower limb, excluding foot: Secondary | ICD-10-CM | POA: Diagnosis not present

## 2012-04-30 ENCOUNTER — Telehealth: Payer: Self-pay | Admitting: Internal Medicine

## 2012-05-01 NOTE — Telephone Encounter (Signed)
Left message for patient to call back  

## 2012-05-05 ENCOUNTER — Encounter: Payer: Self-pay | Admitting: Internal Medicine

## 2012-05-05 ENCOUNTER — Ambulatory Visit (INDEPENDENT_AMBULATORY_CARE_PROVIDER_SITE_OTHER): Payer: Medicare Other | Admitting: Internal Medicine

## 2012-05-05 VITALS — BP 148/90 | HR 86 | Temp 98.2°F | Ht 66.0 in | Wt 143.0 lb

## 2012-05-05 DIAGNOSIS — IMO0002 Reserved for concepts with insufficient information to code with codable children: Secondary | ICD-10-CM

## 2012-05-05 DIAGNOSIS — R627 Adult failure to thrive: Secondary | ICD-10-CM

## 2012-05-05 DIAGNOSIS — M545 Low back pain: Secondary | ICD-10-CM | POA: Diagnosis not present

## 2012-05-05 NOTE — Progress Notes (Signed)
Subjective:    Patient ID: [name], female    DOB: 1946-04-29, 66 y.o.   MRN: [id number]  HPI [name] presents for generalized sense of feeling ill: she has had diarrhea, poor balance with falls, s/p right shoulder surgery. She has followed with Dr. Veverly Fells for shoulder issues, bilateral bursitis of the hip. She has had leg pain and weakness. MRI lumbar spine revealed marked DDD L2-S1 but she has not been a surgical candidate after full evaluation by Dr. Vertell Limber. She did have ESI by Dr. Maryjean Ka that did help.   For GI issues she has been diagnosed with colitis and saw Dr. Henrene Pastor in April '13 and was restarted on Asacol. She has also been on a gluten free, egg free, no greenery, citrus, honey etc and reports she initially felt better. She is still having a lot of diarrhea but no blood but does have mucus. She has a lot of weight loss due in part to this very low carb diet. She did have negative EGD in June '12. She does get some relief of diarrhea with Lomotil.  She has continued to have balance issues. This may not be due to DDD. She was taken off gabapentin and her head is more clear and her balance was improved. She had MRI brain Oct '12 - IMPRESSION:  1. No evidence of acute ischemia.  2. Nonspecific subcortical white matter changes supratentorially  and in the paramedian pons. These probably representsequelae of  chronic microvascular ischemia which may relate to hypertension  and/or diabetes, with less likely possibility of a vasculitis or  demyelinating process. Clinical correlation suggested. MRA brain - that was negative for any vascular occlusion, stenosis or aneurysm.   Psychiatrically - she feels that she is doing OK . She continues on lamotrigine, methyphenidate, bupropion, fluoxetine. Sleep issues - she has restarted [place]  She is on thyroid replacement - TSH, FT4 - Apr 09, 2012.   Rheumatologic eval - RF negative June '11; ANA neg Nov '13; B12  Nl ; CRP mildly elevated  at 1.11 Oct '12. SPEP with minor abnormalities - but no M spike.   PMH, FamHx and SocHx reviewed for any changes and relevance.  Current Outpatient Prescriptions on File Prior to Visit  Medication Sig Dispense Refill  . ACETAMINOPHEN-BUTALBITAL (BUPAP) 50-650 MG TABS Take 1 tablet by mouth 3 (three) times daily as needed.  60 each  0  . ALPRAZolam (XANAX) 0.5 MG tablet Take 0.5 mg by mouth 3 (three) times daily as needed.      . ASACOL HD 800 MG TBEC TAKE ONE TABLET TWICE DAILY  60 each  3  . buPROPion (WELLBUTRIN XL) 300 MG 24 hr tablet TAKE ONE TABLET EACH DAY  30 tablet  6  . diphenoxylate-atropine (LOMOTIL) 2.5-0.025 MG per tablet Take 1 tablet by mouth 4 (four) times daily as needed.      [name] FLUoxetine (PROZAC) 20 MG capsule Take 40 mg by mouth daily.       . hyoscyamine (LEVSIN SL) 0.125 MG SL tablet Place 0.125 mg under the tongue as needed. 6 times a month       . lamoTRIgine (LAMICTAL) 200 MG tablet Take 200 mg by mouth daily.        . methylphenidate (CONCERTA) 27 MG CR tablet Take 1 tablet (27 mg total) by mouth every morning.    0  . metoCLOPramide (REGLAN) 5 MG tablet Take 5 mg by mouth every 6 (six) hours as needed.      [name]  pantoprazole (PROTONIX) 40 MG tablet TAKE ONE TABLET TWICE DAILY  60 tablet  11  . pirbuterol (MAXAIR) 200 MCG/INH inhaler Inhale 2 puffs into the lungs as needed. As needed for asthma/allergies.      . promethazine (PHENERGAN) 12.5 MG tablet Take by mouth every 6 (six) hours as needed.      [name] SYNTHROID 100 MCG tablet TAKE ONE TABLET EVERY MORNING  30 tablet  4  . zolpidem (AMBIEN CR) 12.5 MG CR tablet Take 10 mg by mouth at bedtime as needed.        . [DISCONTINUED] losartan-hydrochlorothiazide (HYZAAR) 50-12.5 MG per tablet Take 1 tablet by mouth daily.      . [DISCONTINUED] simvastatin (ZOCOR) 20 MG tablet Take 20 mg by mouth every evening.          Review of Systems System review is negative for any constitutional, cardiac, pulmonary, GI or neuro  symptoms or complaints other than as described in the HPI.     Objective:   Physical Exam Filed Vitals:   05/05/12 1608  BP: 148/90  Pulse: 86  Temp: 98.2 F (36.8 C)   Wt Readings from Last 3 Encounters:  05/06/12 144 lb 6.4 oz (65.499 kg)  05/05/12 143 lb (64.864 kg)  03/26/12 150 lb (68.04 kg)   Gen'l- WNWD haggard appearing white woman in no distress HEENT- C&S clear, PERRLA Cor - RRR Pulm - normal respirations Psych - not agitated but unhappy        Assessment & Plan:  Failure to thrive - no clearly identified medical source of her sense of ill health. Rheumatologic/serologic eval negative, GI issues are still a problem but she has been under treatment, psych issues are a prime potential etiology -- on many psychotropic meds under the direction of psychiatry, neuro eval, including MRI/MRA unremarkable..  Plan GI follow up  Consider adjustment of psych meds.

## 2012-05-05 NOTE — Patient Instructions (Addendum)
Many issues to explore in regard to your complex of issues: colitis may play a role, depression is always a possibility. Weight loss is very much related to the diet you are on. Plus the effects of colitis. Neuro/rheumatologic issues need to be explored- Dr. Jodi Mourning eval did not reveal anything specific but I need to review his notes and the studies. There are only a few rheumatologic issues with most of the testing, ANA, Rheumatoid factor, B12, Vit D being normal. There is a possibility of vasculitis that I need to think more about but we may need a rheumatologic evaluation. The MRI of the brain did show some subtle cortical white matter changes with no definitive diagnosis but several potential issues such as vasculitis. Of course we have to include in our thinking the effects of fibromyalgia.  Bear with me as I sort all of this out and I will be back in touch with you in regard to a plan for further evaluating your symptoms.

## 2012-05-05 NOTE — Telephone Encounter (Signed)
Pt called requesting to be seen sooner than 1st available. Pt thinks her colitis is flaring up. States she is not eating, has lost weight, has diarrhea and feels terrible. Pt scheduled to see Willette Cluster NP tomorrow at 2pm. Pt aware of appt date and time.

## 2012-05-06 ENCOUNTER — Ambulatory Visit (INDEPENDENT_AMBULATORY_CARE_PROVIDER_SITE_OTHER): Payer: Medicare Other | Admitting: Nurse Practitioner

## 2012-05-06 ENCOUNTER — Encounter: Payer: Self-pay | Admitting: *Deleted

## 2012-05-06 ENCOUNTER — Other Ambulatory Visit (INDEPENDENT_AMBULATORY_CARE_PROVIDER_SITE_OTHER): Payer: Medicare Other

## 2012-05-06 VITALS — BP 130/80 | HR 74 | Ht 66.0 in | Wt 144.4 lb

## 2012-05-06 DIAGNOSIS — K5190 Ulcerative colitis, unspecified, without complications: Secondary | ICD-10-CM | POA: Diagnosis not present

## 2012-05-06 DIAGNOSIS — K589 Irritable bowel syndrome without diarrhea: Secondary | ICD-10-CM | POA: Diagnosis not present

## 2012-05-06 LAB — CBC WITH DIFFERENTIAL/PLATELET
Basophils Absolute: 0 10*3/uL (ref 0.0–0.1)
Basophils Relative: 0.7 % (ref 0.0–3.0)
Eosinophils Absolute: 0 10*3/uL (ref 0.0–0.7)
Eosinophils Relative: 0.1 % (ref 0.0–5.0)
HCT: 37 % (ref 36.0–46.0)
Hemoglobin: 11.8 g/dL — ABNORMAL LOW (ref 12.0–15.0)
Lymphocytes Relative: 16.8 % (ref 12.0–46.0)
Lymphs Abs: 1.1 10*3/uL (ref 0.7–4.0)
MCHC: 31.9 g/dL (ref 30.0–36.0)
MCV: 82.9 fl (ref 78.0–100.0)
Monocytes Absolute: 0.7 10*3/uL (ref 0.1–1.0)
Monocytes Relative: 10.5 % (ref 3.0–12.0)
Neutro Abs: 4.8 10*3/uL (ref 1.4–7.7)
Neutrophils Relative %: 71.9 % (ref 43.0–77.0)
Platelets: 272 10*3/uL (ref 150.0–400.0)
RBC: 4.47 Mil/uL (ref 3.87–5.11)
RDW: 16.7 % — ABNORMAL HIGH (ref 11.5–14.6)
WBC: 6.6 10*3/uL (ref 4.5–10.5)

## 2012-05-06 LAB — SEDIMENTATION RATE: Sed Rate: 11 mm/hr (ref 0–22)

## 2012-05-06 MED ORDER — MOVIPREP 100 G PO SOLR: 1.0000 | Freq: Once | ORAL | Status: AC

## 2012-05-06 NOTE — Progress Notes (Signed)
05/06/2012 DONN WILMOT [id number] [birth year]   History of Present Illness:  Patient is a 66 year old female followed by Dr. Marina Goodell for history of ulcerative colitis diagnosed in June 2012. She is on Asacol HD 800 mg twice daily. She was last seen in March of this year and was doing okay until 2 months ago when she developed increased frequency of loose,mucoid stools. Patient decided to be tested for food allergies and was found to have several of them including, but not limited to, eggs, several fruits, honey, and lettuce. Despite avoiding these items she does not feel much better. In fact, she reports feeling terrible with lethargy, a 7 pound weight loss, short term memory loss, and hot flashes. Patient wonders if depression may be contributing. Her bowel movements are "atrocious". Stools are all liquid. They vary in frequency from 1-4 times a day. Over the last couple  of days she has developed lower abdominal cramps.  Patient is very frustrated, tired of being tired.   Current Medications, Allergies, Past Medical History, Past Surgical History, Family History and Social History were reviewed in [employer] record.   Physical Exam: General: Well developed , white female in no acute distress Head: Normocephalic and atraumatic Eyes:  sclerae anicteric, conjunctiva pink  Ears: Normal auditory acuity Lungs: Clear throughout to auscultation Heart: Regular rate and rhythm Abdomen: Soft, non tender and non distended. No masses, no hepatomegaly. Normal bowel sounds Musculoskeletal: Symmetrical with no gross deformities  Extremities: No edema  Neurological: Alert oriented x 4, grossly nonfocal Psychological:  Alert and cooperative. Normal mood and affect  Assessment and Recommendations: 1. pleasant 66 year old female with ulcerative colitis diagnosed June 2012. Colonoscopy revealed granularity and mild friability in the right colon. Her terminal ileum was normal. There  were questionable subtle changes in the transverse and left colon. Patient has been on Asacol HD 800 mg twice daily since her last appointment here March 2013. Over the last 1- 2 months she's had increased frequency of stools. Her stools are much more watery in consistency and contain mucus. She has now recently developed lower abdominal cramping. Patient complains of extreme fatigue. I think we do need to exclude superimposed infectious colitis, will check stool studies. In addition will check a CBC, ESR and CRP. Patient will be tentatively scheduled for a colonoscopy sometime in December in case above studies are nondiagnostic and symptoms persist.  2. multiple general medical complaints, not sure they can all be attributed to her history of ulcerative colitis. Patient is followed by Dr. Debby Bud, she saw him yesterday in fact.  Patient does wonder if depression may be a contributing factor. She is on several psychiatric medications.

## 2012-05-06 NOTE — Patient Instructions (Addendum)
Please go to the basement level to have your labs drawn.  We sent a prescription for the prep for the colonoscopy to [facility]. Take Imodium as needed 2-3 times a day for diarrhea. You have been scheduled for a colonoscopy with propofol. Please follow written instructions given to you at your visit today.  Please pick up your prep kit at the pharmacy within the next 1-3 days. If you use inhalers (even only as needed) or a CPAP machine, please bring them with you on the day of your procedure.

## 2012-05-07 ENCOUNTER — Other Ambulatory Visit: Payer: Medicare Other

## 2012-05-07 ENCOUNTER — Encounter: Payer: Self-pay | Admitting: Nurse Practitioner

## 2012-05-07 DIAGNOSIS — K589 Irritable bowel syndrome without diarrhea: Secondary | ICD-10-CM | POA: Diagnosis not present

## 2012-05-07 DIAGNOSIS — K5190 Ulcerative colitis, unspecified, without complications: Secondary | ICD-10-CM

## 2012-05-07 NOTE — Progress Notes (Signed)
Noted. Paula followup on stool studies and patient clinical status

## 2012-05-08 DIAGNOSIS — F064 Anxiety disorder due to known physiological condition: Secondary | ICD-10-CM | POA: Diagnosis not present

## 2012-05-08 DIAGNOSIS — M545 Low back pain: Secondary | ICD-10-CM | POA: Diagnosis not present

## 2012-05-11 LAB — STOOL CULTURE

## 2012-05-12 ENCOUNTER — Telehealth: Payer: Self-pay | Admitting: *Deleted

## 2012-05-12 DIAGNOSIS — M545 Low back pain: Secondary | ICD-10-CM | POA: Diagnosis not present

## 2012-05-12 NOTE — Telephone Encounter (Signed)
Pt called stating she is still feeling lousy. Pt has been to the gastroenterologist and they say everything looks fine. Pt wants to know where to go from here. Please advise.

## 2012-05-13 ENCOUNTER — Other Ambulatory Visit: Payer: Self-pay | Admitting: Internal Medicine

## 2012-05-14 ENCOUNTER — Telehealth: Payer: Self-pay | Admitting: *Deleted

## 2012-05-14 NOTE — Telephone Encounter (Signed)
Pt called again stating she feels lousy and is unsure what to do. Please advise.

## 2012-05-15 ENCOUNTER — Ambulatory Visit: Payer: Medicare Other | Admitting: Internal Medicine

## 2012-05-18 ENCOUNTER — Telehealth: Payer: Self-pay | Admitting: Internal Medicine

## 2012-05-18 NOTE — Telephone Encounter (Signed)
Called pt, lvmom for pt to call back.  Will try back if call is not returned.

## 2012-05-18 NOTE — Telephone Encounter (Signed)
I have reviewed my note, the patients chart including the most recent visit to GI.  Best to schedule her for appointment - Hoyle Sauer notified.

## 2012-05-18 NOTE — Telephone Encounter (Signed)
Message copied by Newell Coral on Mon May 18, 2012 10:20 AM ------      Message from: Illene Regulus E      Created: Mon May 18, 2012  9:08 AM       Appointment to review her situation and discuss where we can go from here.

## 2012-06-04 ENCOUNTER — Ambulatory Visit (AMBULATORY_SURGERY_CENTER): Payer: Medicare Other | Admitting: Internal Medicine

## 2012-06-04 ENCOUNTER — Encounter: Payer: Self-pay | Admitting: Internal Medicine

## 2012-06-04 VITALS — BP 128/77 | HR 69 | Temp 98.3°F | Resp 19 | Ht 66.0 in | Wt 144.0 lb

## 2012-06-04 DIAGNOSIS — Z800 Family history of malignant neoplasm of digestive organs: Secondary | ICD-10-CM | POA: Diagnosis not present

## 2012-06-04 DIAGNOSIS — Z85038 Personal history of other malignant neoplasm of large intestine: Secondary | ICD-10-CM | POA: Diagnosis not present

## 2012-06-04 DIAGNOSIS — Z1211 Encounter for screening for malignant neoplasm of colon: Secondary | ICD-10-CM | POA: Diagnosis not present

## 2012-06-04 DIAGNOSIS — K5190 Ulcerative colitis, unspecified, without complications: Secondary | ICD-10-CM

## 2012-06-04 DIAGNOSIS — R197 Diarrhea, unspecified: Secondary | ICD-10-CM

## 2012-06-04 MED ORDER — PREDNISONE 20 MG PO TABS: ORAL_TABLET | ORAL | Status: DC

## 2012-06-04 MED ORDER — MESALAMINE 800 MG PO TBEC: DELAYED_RELEASE_TABLET | ORAL | Status: DC

## 2012-06-04 MED ORDER — SODIUM CHLORIDE 0.9 % IV SOLN: 500.0000 mL | INTRAVENOUS | Status: DC

## 2012-06-04 NOTE — Progress Notes (Signed)
Called to room to assist during endoscopic procedure.  Patient ID and intended procedure confirmed with present staff. Received instructions for my participation in the procedure from the performing physician.  

## 2012-06-04 NOTE — Op Note (Signed)
[facility] [address] [place], [address]   COLONOSCOPY PROCEDURE REPORT  PATIENT: [name], [name]  MR#: [id number] BIRTHDATE: 10-09-1945 , 66  yrs. old GENDER: Female ENDOSCOPIST: Roxy Cedar, MD REFERRED BY:.  Self / Office PROCEDURE DATE:  06/04/2012 PROCEDURE:   Colonoscopy with biopsies ASA CLASS:   Class II INDICATIONS:Patient's immediate family history of colon cancer, High risk patient with previously diagnosed UC, and Patient's personal history of adenomatous colon polyps. MEDICATIONS: MAC sedation, administered by CRNA and propofol (Diprivan) 150mg  IV  DESCRIPTION OF PROCEDURE:   After the risks benefits and alternatives of the procedure were thoroughly explained, informed consent was obtained.  A digital rectal exam revealed no abnormalities of the rectum.   The LB CF-Q180AL [id number] and LB CF-H180AL [id number]  endoscope was introduced through the anus and advanced to the cecum, which was identified by both the appendix and ileocecal valve. No adverse events experienced.   The quality of the prep was adequate, using MoviPrep  The instrument was then slowly withdrawn as the colon was fully examined.      COLON FINDINGS: Diffuse mild to moderate colitis throughout (granularity, friability, mild ulceration).  Varying degrees of severity.  Biopsies taken throughout.  Retroflexed views revealed no abnormalities. The time to cecum=5 minutes 16 seconds. Withdrawal time=10 minutes 30 seconds.  The scope was withdrawn and the procedure completed. COMPLICATIONS: There were no complications.  ENDOSCOPIC IMPRESSION: 1. Diffuse mild to moderate colitis throughout (granularity, friability, mild ulceration).  Biopsies taken throughout  RECOMMENDATIONS: 1.  Start Prednisone 40 mg daily for 2 weeks then 30 mg daily for 2 weeks then 20 mg daily (20mg ; #100; no refills) 2.  Increase Asacol HD 800 mg po twice daily 3.  Repeat colon 5 years 4.  Await  biopsy results 5.  Call office for follow-up appointment in 4 weeks   eSigned:  Roxy Cedar, MD 06/04/2012 3:17 PM  cc: The Patient and Jacques Navy, MD   PATIENT NAME:  [name] MR#: [id number]

## 2012-06-04 NOTE — Patient Instructions (Addendum)

## 2012-06-04 NOTE — Progress Notes (Signed)
Propofol given over incremental dosages 

## 2012-06-04 NOTE — Progress Notes (Signed)
Patient did not experience any of the following events: a burn prior to discharge; a fall within the facility; wrong site/side/patient/procedure/implant event; or a hospital transfer or hospital admission upon discharge from the facility. (G8907) Patient did not have preoperative order for IV antibiotic SSI prophylaxis. (G8918)  

## 2012-06-05 ENCOUNTER — Telehealth: Payer: Self-pay

## 2012-06-05 NOTE — Telephone Encounter (Signed)
  Follow up Call-  Call back number 06/04/2012 12/05/2010  Post procedure Call Back phone  # [phone]  Permission to leave phone message Yes -     Patient questions:  Do you have a fever, pain , or abdominal swelling? no Pain Score  0 *  Have you tolerated food without any problems? yes  Have you been able to return to your normal activities? yes  Do you have any questions about your discharge instructions: Diet   no Medications  no Follow up visit  no  Do you have questions or concerns about your Care? no  Actions: * If pain score is 4 or above: No action needed, pain <4.

## 2012-06-10 ENCOUNTER — Encounter: Payer: Self-pay | Admitting: Internal Medicine

## 2012-06-10 ENCOUNTER — Ambulatory Visit (INDEPENDENT_AMBULATORY_CARE_PROVIDER_SITE_OTHER): Payer: Medicare Other | Admitting: Internal Medicine

## 2012-06-10 VITALS — BP 142/80 | HR 89 | Temp 98.4°F | Resp 12 | Wt 143.0 lb

## 2012-06-10 DIAGNOSIS — E039 Hypothyroidism, unspecified: Secondary | ICD-10-CM | POA: Diagnosis not present

## 2012-06-10 DIAGNOSIS — Z23 Encounter for immunization: Secondary | ICD-10-CM | POA: Diagnosis not present

## 2012-06-10 DIAGNOSIS — K5190 Ulcerative colitis, unspecified, without complications: Secondary | ICD-10-CM

## 2012-06-10 DIAGNOSIS — R279 Unspecified lack of coordination: Secondary | ICD-10-CM

## 2012-06-10 DIAGNOSIS — F329 Major depressive disorder, single episode, unspecified: Secondary | ICD-10-CM | POA: Diagnosis not present

## 2012-06-10 DIAGNOSIS — I10 Essential (primary) hypertension: Secondary | ICD-10-CM

## 2012-06-10 NOTE — Patient Instructions (Addendum)
Glad you are doing well.  We will set you up for neuro rehab - you will be called.

## 2012-06-10 NOTE — Progress Notes (Signed)
Subjective:    Patient ID: [name], female    DOB: [birth year], 66 y.o.   MRN: [id number]  HPI [name] returns for follow up of generalized illness with weight loss, abdominal pain, weakness, and depression. In the interval she had full lab evaluation including thyroid function, rheumatologic serologies, general chemistries - all normal. She has been seen by Dr. Henrene Pastor and had colonoscopy. Her IBD was flaring. She had her asacol increased and was put on steroids. She is feeling MUCH better: return of appetite, weight has stabilized. She continues to have issues with balance and leg weakness. Her neurosurgeon feels this may be a CNS related, rather than orthopedic related, problem but she does not have a cervical myelopathy on his exam or any surgical problems. She has been advised to use a cane for balance.  Past Medical History  Diagnosis Date  . Anxiety   . Depression   . Thyroid disease   . Allergy   . GERD (gastroesophageal reflux disease)   . IBS (irritable bowel syndrome)   . Ulcerative colitis   . Attention deficit disorder without mention of hyperactivity   . Unspecified asthma   . Osteoarthrosis, unspecified whether generalized or localized, unspecified site   . Unspecified essential hypertension   . Other and unspecified hyperlipidemia   . Diverticulosis   . Colitis 12/05/2010   Past Surgical History  Procedure Date  . Cholecystectomy   . Colonoscopy   . Upper gastrointestinal endoscopy   . Back surgery     fluid removed from between disks  . Shoulder surgery     right shoulder 3x   . Total shoulder arthroplasty   . Toe surgery     right big toe  . Cystectomy     left under arm   Family History  Problem Relation Age of Onset  . Hypertension Father   . Colon cancer Father 44  . Arthritis Father   . Heart disease Father     CHF  . Diabetes Neg Hx    History   Social History  . Marital Status: Married    Spouse Name: N/A    Number of Children: 3  .  Years of Education: 16   Occupational History  . interior decorator    Social History Main Topics  . Smoking status: Former Smoker    Quit date: 10/30/1987  . Smokeless tobacco: Never Used  . Alcohol Use: 0.5 oz/week    1 drink(s) per week     Comment: once a week  . Drug Use: No  . Sexually Active: Not on file   Other Topics Concern  . Not on file   Social History Narrative   [employer]; post-graduate study [employer].  married '68.  3 children: 2 daughters - '70, '72; 1 son '82; 6 grand-children. Lost her mother 2008, elderly father lives alone-in [place] died in 09-03-2022. owner/operator interior Agricultural consultant business. SO-in good health.  Marriage is a work in progress    Current Outpatient Prescriptions on File Prior to Visit  Medication Sig Dispense Refill  . ALPRAZolam (XANAX) 0.5 MG tablet Take 0.5 mg by mouth 3 (three) times daily as needed.      . ASACOL HD 800 MG TBEC TAKE ONE TABLET TWICE DAILY  60 each  3  . buPROPion (WELLBUTRIN XL) 300 MG 24 hr tablet TAKE ONE TABLET EACH DAY  30 tablet  6  . FLUoxetine (PROZAC) 20 MG capsule Take 40 mg by mouth daily.       [name]  ketoprofen (ORUDIS) 75 MG capsule TAKE ONE CAPSULE 3 TIMES DAILY          AS NEEDED FOR PAIN  90 capsule  1  . lamoTRIgine (LAMICTAL) 200 MG tablet Take 200 mg by mouth daily.        . Mesalamine (ASACOL HD) 800 MG TBEC Increase to two twice daily  240 tablet  0  . methylphenidate (CONCERTA) 27 MG CR tablet Take 1 tablet (27 mg total) by mouth every morning.    0  . metoCLOPramide (REGLAN) 5 MG tablet Take 5 mg by mouth every 6 (six) hours as needed.      . pantoprazole (PROTONIX) 40 MG tablet TAKE ONE TABLET TWICE DAILY  60 tablet  11  . pirbuterol (MAXAIR) 200 MCG/INH inhaler Inhale 2 puffs into the lungs as needed. As needed for asthma/allergies.      . predniSONE (DELTASONE) 20 MG tablet 40 mg daily x2 weeks, then 30 mg daily x2 weeks, then 20 mg every day  100 tablet  0  . promethazine (PHENERGAN) 12.5 MG  tablet Take by mouth every 6 (six) hours as needed.      . zolpidem (AMBIEN CR) 12.5 MG CR tablet Take 10 mg by mouth at bedtime as needed.        . ACETAMINOPHEN-BUTALBITAL (BUPAP) 50-650 MG TABS Take 1 tablet by mouth 3 (three) times daily as needed.  60 each  0  . diphenoxylate-atropine (LOMOTIL) 2.5-0.025 MG per tablet Take 1 tablet by mouth 4 (four) times daily as needed.      . hyoscyamine (LEVSIN SL) 0.125 MG SL tablet Place 0.125 mg under the tongue as needed. 6 times a month       . SYNTHROID 100 MCG tablet TAKE ONE TABLET EVERY MORNING  30 tablet  4  . [DISCONTINUED] losartan-hydrochlorothiazide (HYZAAR) 50-12.5 MG per tablet Take 1 tablet by mouth daily.      . [DISCONTINUED] simvastatin (ZOCOR) 20 MG tablet Take 20 mg by mouth every evening.          Review of Systems System review is negative for any constitutional, cardiac, pulmonary, GI or neuro symptoms or complaints other than as described in the HPI.     Objective:   Physical Exam Filed Vitals:   06/10/12 1312  BP: 142/80  Pulse: 89  Temp: 98.4 F (36.9 C)  Resp: 12   Wt Readings from Last 3 Encounters:  06/10/12 143 lb (64.864 kg)  06/04/12 144 lb (65.318 kg)  05/06/12 144 lb 6.4 oz (65.499 kg)   Gen'l- WNWD woman in no acute distress - seemingly much happier than at last visit HEENT- C&S clear Cor - RRR Pulm - normal respirations Neuro - A&O x 3, ambulates w/o assistance.       Assessment & Plan:

## 2012-06-11 NOTE — Assessment & Plan Note (Signed)
Patient with a flare of IBD doing MUCH better on increased mesalamine and steroids.  Plan  Per Dr. Henrene Pastor.

## 2012-06-11 NOTE — Assessment & Plan Note (Signed)
Patient with continued balance and gait problems. No surgical problems identified. She is very functional  Plan  Neuro Rehabilitation to improve gait and balance.

## 2012-06-11 NOTE — Assessment & Plan Note (Signed)
Appears to be doing much better, due in large measure to treatment of her IBD

## 2012-06-11 NOTE — Assessment & Plan Note (Signed)
BP Readings from Last 3 Encounters:  06/10/12 142/80  06/04/12 128/77  05/06/12 130/80   Adequate control

## 2012-06-11 NOTE — Assessment & Plan Note (Signed)
Lab Results  Component Value Date   TSH 2.55 04/09/2012   Normal thyroid functions on current dose of levothyroxine

## 2012-06-12 ENCOUNTER — Encounter: Payer: Self-pay | Admitting: Internal Medicine

## 2012-06-25 ENCOUNTER — Other Ambulatory Visit: Payer: Self-pay | Admitting: Internal Medicine

## 2012-07-02 ENCOUNTER — Ambulatory Visit: Payer: Medicare Other | Admitting: Physical Therapy

## 2012-07-06 ENCOUNTER — Ambulatory Visit (INDEPENDENT_AMBULATORY_CARE_PROVIDER_SITE_OTHER): Payer: Medicare Other | Admitting: Internal Medicine

## 2012-07-06 ENCOUNTER — Encounter: Payer: Self-pay | Admitting: Internal Medicine

## 2012-07-06 VITALS — BP 126/70 | HR 100 | Ht 66.0 in | Wt 143.4 lb

## 2012-07-06 DIAGNOSIS — Z860100 Personal history of colon polyps, unspecified: Secondary | ICD-10-CM

## 2012-07-06 DIAGNOSIS — K589 Irritable bowel syndrome without diarrhea: Secondary | ICD-10-CM | POA: Diagnosis not present

## 2012-07-06 DIAGNOSIS — K5190 Ulcerative colitis, unspecified, without complications: Secondary | ICD-10-CM

## 2012-07-06 NOTE — Progress Notes (Signed)
HISTORY OF PRESENT ILLNESS:  [name] is a 67 y.o. female with a history of ulcerative colitis, irritable bowel syndrome, and adenomatous colon polyps. She was seen in the office November 2013 regarding diarrhea and fatigue. She was found to be mildly anemic with hemoglobin of 11.8. Multiple stool studies were negative for enteric pathogens. She subsequently underwent complete colonoscopy on 06/04/2012. At that time she was found to have diffuse mild to moderate colitis. Biopsies were consistent with the same. Asacol was increased to 1.6 g twice a day. Prednisone 40 mg daily initiated. She has tapered by 10 mg every 2 weeks. Currently on 20 mg daily. She states that she is "500% better". She describes 2-3 bowel movements per day, mostly in the morning. No bleeding. No dominant pain. Energy levels improved. No new complaints. Planning on leaving for [place] in 2 weeks.  REVIEW OF SYSTEMS:  All non-GI ROS negative except for skin rash  Past Medical History  Diagnosis Date  . Anxiety   . Depression   . Thyroid disease   . Allergy   . GERD (gastroesophageal reflux disease)   . IBS (irritable bowel syndrome)   . Ulcerative colitis   . Attention deficit disorder without mention of hyperactivity   . Unspecified asthma   . Osteoarthrosis, unspecified whether generalized or localized, unspecified site   . Unspecified essential hypertension   . Other and unspecified hyperlipidemia   . Diverticulosis   . Colitis 12/05/2010    Past Surgical History  Procedure Date  . Cholecystectomy   . Colonoscopy   . Upper gastrointestinal endoscopy   . Back surgery     fluid removed from between disks  . Shoulder surgery     right shoulder 3x   . Total shoulder arthroplasty   . Toe surgery     right big toe  . Cystectomy     left under arm    Social History ADJA RUFF  reports that she quit smoking about 24 years ago. She has never used smokeless tobacco. She reports that she drinks  about .5 ounces of alcohol per week. She reports that she does not use illicit drugs.  family history includes Arthritis in her father; Colon cancer (age of onset:96) in her father; Heart disease in her father; and Hypertension in her father.  There is no history of Diabetes.  Allergies  Allergen Reactions  . Amoxicillin-Pot Clavulanate Other (See Comments)    GI upset  . Erythromycin   . Morphine And Related        PHYSICAL EXAMINATION: Vital signs: BP 126/70  Pulse 100  Ht 5\' 6"  (1.676 m)  Wt 143 lb 6.4 oz (65.046 kg)  BMI 23.15 kg/m2  Constitutional: generally well-appearing, no acute distress Psychiatric: alert and oriented x3, cooperative Eyes: extraocular movements intact, anicteric, conjunctiva pink Mouth: oral pharynx moist, no lesions Neck: supple no lymphadenopathy Cardiovascular: heart regular rate and rhythm, no murmur Lungs: clear to auscultation bilaterally Abdomen: soft, nontender, nondistended, no obvious ascites, no peritoneal signs, normal bowel sounds, no organomegaly Extremities: no lower extremity edema bilaterally Skin: Scattered ecchymoses on visible extremities Neuro: No focal deficits.   ASSESSMENT:  #1. Ulcerative colitis, diagnosed June 2012. Improved on medical therapy #2. IBS, history of #3. Adenomatous colon polyps, history of   PLAN:  #1. Continue prednisone 20 mg daily for 2 weeks then 10 mg daily for 2 weeks then stop #2. Continue Asacol 2.4 g daily #3. Patient will followup with me in April when  she returns from [place]. She does contact the office in the interim for any questions or problems #4. Surveillance colonoscopy in 5 years

## 2012-07-06 NOTE — Patient Instructions (Addendum)
Stay on Prednisone - 20mg  for 2 weeks then 10mg  for 2 weeks, then stop.  Stay on Asacol as directed  Follow up with Dr. Marina Goodell in April

## 2012-07-07 ENCOUNTER — Ambulatory Visit: Payer: Medicare Other | Attending: Internal Medicine | Admitting: Physical Therapy

## 2012-07-23 DIAGNOSIS — I998 Other disorder of circulatory system: Secondary | ICD-10-CM | POA: Diagnosis not present

## 2012-07-30 ENCOUNTER — Other Ambulatory Visit: Payer: Self-pay | Admitting: *Deleted

## 2012-07-30 MED ORDER — KETOPROFEN 75 MG PO CAPS: 75.0000 mg | ORAL_CAPSULE | Freq: Three times a day (TID) | ORAL | Status: DC | PRN

## 2012-07-30 NOTE — Telephone Encounter (Signed)
Pt is in [place]. [facility] sent a fax. [phone]

## 2012-08-24 ENCOUNTER — Telehealth: Payer: Self-pay | Admitting: Internal Medicine

## 2012-08-24 NOTE — Telephone Encounter (Signed)
Pt last seen by Dr. Marina Goodell 07/06/12. Pt states that she finished taking prednisone on 08-10-12. States that she feels awful now, no energy, her legs ache, and her stomach hurts. States she is having frequent stools. Had 6 loose stools yesterday but no blood or mucous present. Pt is still taking Asacol 2.4gm daily. Pt is currently in [place]. Dr. Marina Goodell please advise.

## 2012-08-24 NOTE — Telephone Encounter (Signed)
Spoke with pt and she knows to start Prednisone 45m daily and to call [place] 1 week.

## 2012-08-24 NOTE — Telephone Encounter (Signed)
Sounds like a relapse of her UC. Have her restart prednisone 40 mg daily and give [place] a follow up call in one week for further instructions

## 2012-08-31 ENCOUNTER — Other Ambulatory Visit: Payer: Self-pay | Admitting: *Deleted

## 2012-08-31 NOTE — Telephone Encounter (Signed)
Last refilled 07/11/2011

## 2012-09-01 MED ORDER — ZOLPIDEM TARTRATE ER 12.5 MG PO TBCR: 10.0000 mg | EXTENDED_RELEASE_TABLET | Freq: Every evening | ORAL | Status: DC | PRN

## 2012-09-03 ENCOUNTER — Telehealth: Payer: Self-pay | Admitting: Internal Medicine

## 2012-09-03 MED ORDER — MESALAMINE 800 MG PO TBEC: DELAYED_RELEASE_TABLET | ORAL | Status: DC

## 2012-09-03 NOTE — Telephone Encounter (Signed)
Pt was instructed to take prednisone 40mg  daily on 08/24/12 and to call us in 1 week. Pt states that the prednisone is helping with the diarrhea but she is still c/o some abdominal discomfort. Also c/o bruising a lot on her hands and arms when she hits anything. Pt wants to know if she can stop the prednisone. She is still taking the asacol 2.4gm daily. Dr. Marina Goodell please advise.

## 2012-09-03 NOTE — Telephone Encounter (Signed)
Called Asacol rx to [facility] in [place].  Spoke to [place].  Rx was 3 tablets 2 times a day (2492m bid).  Called patient and let her know I had refilled the rx.  Patient acknowledged.

## 2012-09-04 NOTE — Telephone Encounter (Signed)
She should not just stop prednisone. Recommend tapering by 10 mg every 2 weeks, then stop

## 2012-09-04 NOTE — Telephone Encounter (Signed)
Left message for pt to call back.  Spoke with pt and she is aware of Dr. Blanch Media instructions regarding tapering the prednisone.

## 2012-09-08 ENCOUNTER — Telehealth: Payer: Self-pay | Admitting: Internal Medicine

## 2012-09-08 MED ORDER — PREDNISONE 20 MG PO TABS: ORAL_TABLET | ORAL | Status: DC

## 2012-09-08 NOTE — Telephone Encounter (Signed)
Pt did not get a prescription when we started the prednisone, she had some left over. Pt needs refill to be able to taper off as instructed. OK to refill.

## 2012-09-22 ENCOUNTER — Telehealth: Payer: Self-pay

## 2012-09-22 NOTE — Telephone Encounter (Signed)
Received fax from pharmacy stating that pantorazole is not covered by insurance. Insurance company will cover omeprazole, please advise if ok

## 2012-09-22 NOTE — Telephone Encounter (Signed)
Omeprazole 40 mg AM and PM, #60, 11 refills

## 2012-09-23 ENCOUNTER — Telehealth: Payer: Self-pay | Admitting: Internal Medicine

## 2012-09-23 MED ORDER — OMEPRAZOLE 40 MG PO CPDR: 40.0000 mg | DELAYED_RELEASE_CAPSULE | Freq: Two times a day (BID) | ORAL | Status: DC

## 2012-09-23 NOTE — Telephone Encounter (Signed)
Medication list updates and rx sent to pharmacy.

## 2012-09-23 NOTE — Telephone Encounter (Signed)
Pt wants Pantoprazole (Protonix generic) called in instead of Omeprazole.  She says it works better.

## 2012-09-23 NOTE — Telephone Encounter (Signed)
LM for pt to returncall

## 2012-09-25 NOTE — Telephone Encounter (Signed)
I called [phone] to initiate prior authorization for Pantoprazole 40 mg 1 po BID. Tasia Catchings will be faxing the prior authorization form for Dr Debby Bud to fill out and sign.

## 2012-10-02 ENCOUNTER — Other Ambulatory Visit: Payer: Self-pay

## 2012-10-02 MED ORDER — ESOMEPRAZOLE MAGNESIUM 40 MG PO CPDR: 40.0000 mg | DELAYED_RELEASE_CAPSULE | Freq: Every day | ORAL | Status: DC

## 2012-10-02 NOTE — Telephone Encounter (Signed)
Pantoprazole was denied. Drugs that are covered are Nexium and Dexilant. A prescription of Nexium was routed electronically to pt's pharmacy.

## 2012-10-02 NOTE — Telephone Encounter (Signed)
Nexium routed electronically to [facility] since Pantoprazole was denied coverage by pt's insurance. (NOTE: the other covered drug is Dexilant)

## 2012-10-19 ENCOUNTER — Other Ambulatory Visit: Payer: Self-pay

## 2012-10-19 MED ORDER — KETOPROFEN 75 MG PO CAPS: 75.0000 mg | ORAL_CAPSULE | Freq: Three times a day (TID) | ORAL | Status: DC | PRN

## 2012-10-20 ENCOUNTER — Other Ambulatory Visit: Payer: Self-pay

## 2012-10-20 MED ORDER — KETOPROFEN 75 MG PO CAPS: 75.0000 mg | ORAL_CAPSULE | Freq: Three times a day (TID) | ORAL | Status: DC | PRN

## 2012-10-20 NOTE — Telephone Encounter (Signed)
Ketoprofen should have been routed electronically to the pharmacy, not printed.

## 2012-10-28 DIAGNOSIS — R030 Elevated blood-pressure reading, without diagnosis of hypertension: Secondary | ICD-10-CM | POA: Diagnosis not present

## 2012-10-28 DIAGNOSIS — S81009A Unspecified open wound, unspecified knee, initial encounter: Secondary | ICD-10-CM | POA: Diagnosis not present

## 2012-10-28 DIAGNOSIS — L738 Other specified follicular disorders: Secondary | ICD-10-CM | POA: Diagnosis not present

## 2012-11-02 DIAGNOSIS — Z006 Encounter for examination for normal comparison and control in clinical research program: Secondary | ICD-10-CM | POA: Diagnosis not present

## 2012-11-02 DIAGNOSIS — F064 Anxiety disorder due to known physiological condition: Secondary | ICD-10-CM | POA: Diagnosis not present

## 2012-11-02 DIAGNOSIS — F331 Major depressive disorder, recurrent, moderate: Secondary | ICD-10-CM | POA: Diagnosis not present

## 2012-11-02 DIAGNOSIS — F909 Attention-deficit hyperactivity disorder, unspecified type: Secondary | ICD-10-CM | POA: Diagnosis not present

## 2012-11-03 ENCOUNTER — Telehealth: Payer: Self-pay | Admitting: Internal Medicine

## 2012-11-03 NOTE — Telephone Encounter (Signed)
Pt states she has been off of prednisone for about a month. States she has been having bruising, abdominal pain, body aches, and no strength. Pt requesting to be seen. Pt scheduled to see Dr. Henrene Pastor tomorrow at 10:45am. Pt aware of appt date and time.

## 2012-11-04 ENCOUNTER — Ambulatory Visit (INDEPENDENT_AMBULATORY_CARE_PROVIDER_SITE_OTHER): Payer: Medicare Other | Admitting: Internal Medicine

## 2012-11-04 ENCOUNTER — Encounter: Payer: Self-pay | Admitting: Internal Medicine

## 2012-11-04 ENCOUNTER — Other Ambulatory Visit (INDEPENDENT_AMBULATORY_CARE_PROVIDER_SITE_OTHER): Payer: Medicare Other

## 2012-11-04 VITALS — BP 124/80 | HR 100 | Ht 66.0 in | Wt 158.0 lb

## 2012-11-04 DIAGNOSIS — R5383 Other fatigue: Secondary | ICD-10-CM | POA: Diagnosis not present

## 2012-11-04 DIAGNOSIS — R5381 Other malaise: Secondary | ICD-10-CM

## 2012-11-04 DIAGNOSIS — K219 Gastro-esophageal reflux disease without esophagitis: Secondary | ICD-10-CM | POA: Diagnosis not present

## 2012-11-04 DIAGNOSIS — K5190 Ulcerative colitis, unspecified, without complications: Secondary | ICD-10-CM

## 2012-11-04 LAB — CBC WITH DIFFERENTIAL/PLATELET
Basophils Relative: 0.7 % (ref 0.0–3.0)
Eosinophils Relative: 0 % (ref 0.0–5.0)
HCT: 37.9 % (ref 36.0–46.0)
Lymphs Abs: 1.3 10*3/uL (ref 0.7–4.0)
MCV: 78.4 fl (ref 78.0–100.0)
Monocytes Absolute: 0.8 10*3/uL (ref 0.1–1.0)
Monocytes Relative: 9.2 % (ref 3.0–12.0)
Neutrophils Relative %: 75.2 % (ref 43.0–77.0)
RBC: 4.83 Mil/uL (ref 3.87–5.11)
WBC: 8.6 10*3/uL (ref 4.5–10.5)

## 2012-11-04 LAB — BASIC METABOLIC PANEL
Chloride: 108 mEq/L (ref 96–112)
GFR: 68.14 mL/min (ref >60.00)
Potassium: 4.9 mEq/L (ref 3.5–5.1)

## 2012-11-04 LAB — HEPATIC FUNCTION PANEL
Albumin: 3.7 g/dL (ref 3.5–5.2)
Total Protein: 6.2 g/dL (ref 6.0–8.3)

## 2012-11-04 NOTE — Progress Notes (Signed)
HISTORY OF PRESENT ILLNESS:  [name] is a 67 y.o. female with a history of ulcerative colitis, IBS, and adenomatous colon polyps. She was seen in November of 2013 for diarrhea and fatigue. She was mildly anemic. She subsequently underwent complete colonoscopy 06/04/2012. She was found to have diffuse mild to moderate colitis consistent with ulcerative colitis. Biopsies supported that diagnosis. Mesalamine was increased and prednisone started. She was seen in followup in January 2014 (her last office visit). At that time she was markedly improved. At that point, she was to continue on mesalamine 2.4 g and initiate prednisone taper. She left for [place]. She contacted the office March 3 complaining of a colitis flare. He was placed on prednisone 40 mg daily. She weaned off prednisone about 3 weeks ago. She states that she will never take prednisone again citing easy bruisability, weight gain, and irritability significant side effects. She is currently taking Asacol HD 3.2 g daily. She continues on omeprazole for GERD. Her chief complaint today is that of fatigue. In terms of her bowels, she reports 1-6 bowel movements per day. On average 4 bowel movements per day which are never formed. Looser diarrhea most times. No blood or mucus. Fairly constant lower abdominal cramping discomfort. She has not had blood work in 6 months.  REVIEW OF SYSTEMS:  All non-GI ROS negative except for  Past Medical History  Diagnosis Date  . Anxiety   . Depression   . Thyroid disease   . Allergy   . GERD (gastroesophageal reflux disease)   . IBS (irritable bowel syndrome)   . Ulcerative colitis   . Attention deficit disorder without mention of hyperactivity   . Unspecified asthma   . Osteoarthrosis, unspecified whether generalized or localized, unspecified site   . Unspecified essential hypertension   . Other and unspecified hyperlipidemia   . Diverticulosis   . Colitis 12/05/2010    Past Surgical History   Procedure Laterality Date  . Cholecystectomy    . Colonoscopy    . Upper gastrointestinal endoscopy    . Back surgery      fluid removed from between disks  . Shoulder surgery      right shoulder 3x   . Total shoulder arthroplasty    . Toe surgery      right big toe  . Cystectomy      left under arm    Social History MARNIE FAZZINO  reports that she quit smoking about 25 years ago. She has never used smokeless tobacco. She reports that she drinks about 0.5 ounces of alcohol per week. She reports that she does not use illicit drugs.  family history includes Arthritis in her father; Colon cancer (age of onset: 97) in her father; Heart disease in her father; and Hypertension in her father.  There is no history of Diabetes.  Allergies  Allergen Reactions  . Amoxicillin-Pot Clavulanate Other (See Comments)    GI upset  . Erythromycin   . Morphine And Related        PHYSICAL EXAMINATION: Vital signs: BP 124/80  Pulse 100  Ht 5\' 6"  (1.676 m)  Wt 158 lb (71.668 kg)  BMI 25.51 kg/m2 General: Well-developed, well-nourished, no acute distress HEENT: Sclerae are anicteric, conjunctiva pink. Oral mucosa intact Lungs: Clear Heart: Regular Abdomen: soft, nontender, nondistended, no obvious ascites, no peritoneal signs, normal bowel sounds. No organomegaly. Extremities: No edema. Multiple ecchymoses on the arms and legs Psychiatric: alert and oriented x3. Cooperative   ASSESSMENT:  #1.  Universal ulcerative colitis. Continues to have symptoms as manifested by frequent loose stools, abdominal discomfort, and fatigue. Off prednisone for 3 weeks. #2. GERD. Stable on PPI #3. Fatigue. Suspect secondary to #1 above  PLAN:  #1. Laboratories including CBC, comprehensive metabolic panel, TSH, and C. reactive protein #2. Increase Asacol HD from 3.2 g daily to 4.8 g daily #3. Long discussion (15 minutes) on immunomodulator therapy, the risks and benefits and IBD. She has been provided  literature to review. #4. Obtain TPMT. genetics #5. Office followup in 4 weeks. We can decide on immunomodulatory therapy at that time. As well, introduced topic of Biologics

## 2012-11-04 NOTE — Patient Instructions (Addendum)
Your physician has requested that you go to the basement for lab work before leaving today  Increase your Asacol 800 to 3 capsules twice a day (6 a day total)  You have been given some information on medication  Please follow up in 4 weeks

## 2012-11-05 ENCOUNTER — Other Ambulatory Visit (INDEPENDENT_AMBULATORY_CARE_PROVIDER_SITE_OTHER): Payer: Medicare Other

## 2012-11-05 ENCOUNTER — Ambulatory Visit (INDEPENDENT_AMBULATORY_CARE_PROVIDER_SITE_OTHER): Payer: Medicare Other | Admitting: Internal Medicine

## 2012-11-05 ENCOUNTER — Encounter: Payer: Self-pay | Admitting: Internal Medicine

## 2012-11-05 VITALS — BP 150/80 | HR 80 | Temp 97.8°F | Resp 16 | Wt 158.0 lb

## 2012-11-05 DIAGNOSIS — R5381 Other malaise: Secondary | ICD-10-CM | POA: Diagnosis not present

## 2012-11-05 DIAGNOSIS — R209 Unspecified disturbances of skin sensation: Secondary | ICD-10-CM | POA: Diagnosis not present

## 2012-11-05 DIAGNOSIS — M25569 Pain in unspecified knee: Secondary | ICD-10-CM | POA: Insufficient documentation

## 2012-11-05 DIAGNOSIS — R5383 Other fatigue: Secondary | ICD-10-CM

## 2012-11-05 DIAGNOSIS — R202 Paresthesia of skin: Secondary | ICD-10-CM

## 2012-11-05 DIAGNOSIS — K5190 Ulcerative colitis, unspecified, without complications: Secondary | ICD-10-CM

## 2012-11-05 DIAGNOSIS — E559 Vitamin D deficiency, unspecified: Secondary | ICD-10-CM

## 2012-11-05 DIAGNOSIS — M48061 Spinal stenosis, lumbar region without neurogenic claudication: Secondary | ICD-10-CM | POA: Insufficient documentation

## 2012-11-05 LAB — VITAMIN B12: Vitamin B-12: 578 pg/mL (ref 211–911)

## 2012-11-05 LAB — CK: Total CK: 151 U/L (ref 7–177)

## 2012-11-05 MED ORDER — TRAMADOL HCL 50 MG PO TABS: 50.0000 mg | ORAL_TABLET | Freq: Two times a day (BID) | ORAL | Status: DC | PRN

## 2012-11-05 NOTE — Assessment & Plan Note (Signed)
Labs B12

## 2012-11-05 NOTE — Progress Notes (Signed)
  Subjective:   HPI  C/o colitis C/o fatigue C/o B LE pain x 2 months constant, numbness all the time - 6/10. NSAIDs helped a little  Wt Readings from Last 3 Encounters:  11/05/12 158 lb (71.668 kg)  11/04/12 158 lb (71.668 kg)  07/06/12 143 lb 6.4 oz (65.046 kg)   BP Readings from Last 3 Encounters:  11/05/12 150/80  11/04/12 124/80  07/06/12 126/70      Review of Systems  Constitutional: Negative.  Negative for fever, chills, diaphoresis, activity change, appetite change, fatigue and unexpected weight change.  HENT: Negative for hearing loss, ear pain, nosebleeds, congestion, sore throat, facial swelling, rhinorrhea, sneezing, mouth sores, trouble swallowing, neck pain, neck stiffness, postnasal drip, sinus pressure and tinnitus.   Eyes: Negative for pain, discharge, redness, itching and visual disturbance.  Respiratory: Negative for cough, chest tightness, shortness of breath, wheezing and stridor.   Cardiovascular: Negative for chest pain, palpitations and leg swelling.  Gastrointestinal: Positive for abdominal pain and diarrhea. Negative for nausea, constipation, blood in stool, abdominal distention, anal bleeding and rectal pain.  Genitourinary: Negative for dysuria, urgency, frequency, hematuria, flank pain, vaginal bleeding, vaginal discharge, difficulty urinating, genital sores and pelvic pain.  Musculoskeletal: Positive for back pain, arthralgias and gait problem. Negative for joint swelling.  Skin: Negative.  Negative for rash.  Neurological: Negative for dizziness, tremors, seizures, syncope, speech difficulty, weakness, numbness and headaches.  Hematological: Negative for adenopathy. Does not bruise/bleed easily.  Psychiatric/Behavioral: Negative for suicidal ideas, behavioral problems, sleep disturbance, dysphoric mood and decreased concentration. The patient is not nervous/anxious.        Objective:   Physical Exam  Constitutional: She appears well-developed and  well-nourished. No distress.  Musculoskeletal: She exhibits edema (trace R foot) and tenderness.  LS is tender B troch major is tender  Neurological: She displays normal reflexes. Coordination normal.     Lab Results  Component Value Date   WBC 8.6 11/04/2012   HGB 12.3 11/04/2012   HCT 37.9 11/04/2012   PLT 353.0 11/04/2012   GLUCOSE 93 11/04/2012   CHOL 170 03/29/2009   TRIG 68.0 03/29/2009   HDL 52.10 03/29/2009   LDLDIRECT 182.5 02/02/2008   LDLCALC 104* 03/29/2009   ALT 20 11/04/2012   AST 20 11/04/2012   NA 141 11/04/2012   K 4.9 11/04/2012   CL 108 11/04/2012   CREATININE 0.9 11/04/2012   BUN 18 11/04/2012   CO2 27 11/04/2012   TSH 1.69 11/04/2012   INR 1.0 11/25/2008   HGBA1C 5.5 04/24/2012        Assessment & Plan:

## 2012-11-05 NOTE — Patient Instructions (Addendum)
Spiky ball Foam roller Gluten free diet

## 2012-11-05 NOTE — Assessment & Plan Note (Signed)
Off Prednisone now

## 2012-11-12 DIAGNOSIS — M7750 Other enthesopathy of unspecified foot: Secondary | ICD-10-CM | POA: Diagnosis not present

## 2012-11-12 DIAGNOSIS — M779 Enthesopathy, unspecified: Secondary | ICD-10-CM | POA: Diagnosis not present

## 2012-11-13 LAB — THIOPURINE METHYLTRANSFERASE (TPMT), RBC: Thiopurine Methyltransferase, RBC: 12 — ABNORMAL LOW

## 2012-11-28 DIAGNOSIS — F064 Anxiety disorder due to known physiological condition: Secondary | ICD-10-CM | POA: Diagnosis not present

## 2012-12-09 ENCOUNTER — Ambulatory Visit (INDEPENDENT_AMBULATORY_CARE_PROVIDER_SITE_OTHER): Payer: Medicare Other | Admitting: Internal Medicine

## 2012-12-09 ENCOUNTER — Telehealth: Payer: Self-pay | Admitting: Internal Medicine

## 2012-12-09 ENCOUNTER — Encounter: Payer: Self-pay | Admitting: Internal Medicine

## 2012-12-09 VITALS — BP 140/78 | HR 84 | Ht 64.75 in | Wt 153.5 lb

## 2012-12-09 DIAGNOSIS — K5190 Ulcerative colitis, unspecified, without complications: Secondary | ICD-10-CM | POA: Diagnosis not present

## 2012-12-09 DIAGNOSIS — K219 Gastro-esophageal reflux disease without esophagitis: Secondary | ICD-10-CM | POA: Diagnosis not present

## 2012-12-09 DIAGNOSIS — M2550 Pain in unspecified joint: Secondary | ICD-10-CM

## 2012-12-09 DIAGNOSIS — M1990 Unspecified osteoarthritis, unspecified site: Secondary | ICD-10-CM

## 2012-12-09 NOTE — Progress Notes (Signed)
HISTORY OF PRESENT ILLNESS:  [name] is a 67 y.o. female with a history of universal ulcerative colitis, IBS, and adenomatous colon polyps. Complete colonoscopy December of 2013 revealed mild to moderate colitis consistent with ulcerative colitis. She was treated with mesalamine and prednisone. She improved significantly. Some problems recurred off prednisone while in [place]. Seen most recently 11/04/2012. See that dictation. At that time, off prednisone for 3 weeks. We increased mesalamine. Multiple laboratories that they were unremarkable. We also discussed immunomodulatory therapy. TPM T. testing was performed and revealed that she is borderline heterozygous metabolizer (12, with greater than 12 the normal). She presents today for followup as requested. She tells me that she reduce her mesalamine back to 3.2 g daily, about one week ago, due to constipation. Overall sounds like her colitis symptoms have improved except for occasional mucus. No bleeding or significant abdominal pain. Her chief complaint is joint aches, which became increasingly noticeable after discontinuing steroids. We again discussed immunomodulatory therapy and biologic. She is unequivocally uninterested at this time in any additional therapies, soft and mesalamine. Her GERD is under good control with Protonix  REVIEW OF SYSTEMS:  All non-GI ROS negative except for arthritis, back pain, confusion, fatigue, ankle swelling  Past Medical History  Diagnosis Date  . Anxiety   . Depression   . Thyroid disease   . Allergy   . GERD (gastroesophageal reflux disease)   . IBS (irritable bowel syndrome)   . Ulcerative colitis   . Attention deficit disorder without mention of hyperactivity   . Unspecified asthma(493.90)   . Osteoarthrosis, unspecified whether generalized or localized, unspecified site   . Unspecified essential hypertension   . Other and unspecified hyperlipidemia   . Diverticulosis   . Colitis 12/05/2010     Past Surgical History  Procedure Laterality Date  . Cholecystectomy    . Colonoscopy    . Upper gastrointestinal endoscopy    . Back surgery      fluid removed from between disks  . Shoulder surgery      right shoulder 3x   . Total shoulder arthroplasty    . Toe surgery      right big toe  . Cystectomy      left under arm    Social History [name]  reports that she quit smoking about 25 years ago. She has never used smokeless tobacco. She reports that she drinks about 0.5 ounces of alcohol per week. She reports that she does not use illicit drugs.  family history includes Arthritis in her father; Colon cancer (age of onset: 29) in her father; Heart disease in her father; and Hypertension in her father.  There is no history of Diabetes.  Allergies  Allergen Reactions  . Amoxicillin-Pot Clavulanate Other (See Comments)    GI upset  . Erythromycin   . Morphine And Related        PHYSICAL EXAMINATION: Vital signs: BP 140/78  Pulse 84  Ht 5' 4.75" (1.645 m)  Wt 153 lb 8 oz (69.627 kg)  BMI 25.73 kg/m2 General: Well-developed, well-nourished, no acute distress HEENT: Sclerae are anicteric, conjunctiva pink. Oral mucosa intact Lungs: Clear Heart: Regular Abdomen: soft, nontender, nondistended, no obvious ascites, no peritoneal signs, normal bowel sounds. No organomegaly. Extremities: No edema Psychiatric: alert and oriented x3. Cooperative   ASSESSMENT:  #1. Universal ulcerative colitis. Now on mesalamine 3.2 g daily. Seems to be under reasonable control. I was interested in having her initiate either immunomodulator or biologic therapy.  She is not interested. #2. GERD. Controlled with PPI #3. History of adenomatous colon polyps #4. General medical problems   PLAN:  #1. Continue Asacol HD 3.2 g daily #2. Continue reflux precautions and PPI #3. Routine surveillance colonoscopy around December 2018 #4. Routine GI followup in 1 year. Sooner as  needed. #5. Resume general medical care with Dr. Debby Bud

## 2012-12-09 NOTE — Telephone Encounter (Signed)
Patient would like to see if she could be referred to a rheumatologist.  Please give patient a call regarding this.

## 2012-12-09 NOTE — Patient Instructions (Addendum)
Please follow up with Dr. Perry in one year 

## 2012-12-10 ENCOUNTER — Ambulatory Visit: Payer: Medicare Other | Admitting: Internal Medicine

## 2012-12-11 DIAGNOSIS — H2510 Age-related nuclear cataract, unspecified eye: Secondary | ICD-10-CM | POA: Diagnosis not present

## 2012-12-11 DIAGNOSIS — H40019 Open angle with borderline findings, low risk, unspecified eye: Secondary | ICD-10-CM | POA: Diagnosis not present

## 2012-12-14 NOTE — Telephone Encounter (Signed)
Ok. [facility] request placed: Dr. Ouida Sills or Trudie Reed

## 2012-12-16 ENCOUNTER — Ambulatory Visit: Payer: Medicare Other | Admitting: Internal Medicine

## 2012-12-23 ENCOUNTER — Telehealth: Payer: Self-pay

## 2012-12-23 DIAGNOSIS — Z96619 Presence of unspecified artificial shoulder joint: Secondary | ICD-10-CM | POA: Diagnosis not present

## 2012-12-23 NOTE — Telephone Encounter (Signed)
Phone call from patient requesting a referral for a neurologist. Please advise. Thanks

## 2012-12-23 NOTE — Telephone Encounter (Signed)
Need a reason for referral. Thanks

## 2012-12-24 ENCOUNTER — Ambulatory Visit (INDEPENDENT_AMBULATORY_CARE_PROVIDER_SITE_OTHER): Payer: Medicare Other | Admitting: Internal Medicine

## 2012-12-24 ENCOUNTER — Encounter: Payer: Self-pay | Admitting: Internal Medicine

## 2012-12-24 VITALS — BP 150/80 | HR 85 | Temp 97.4°F | Wt 150.8 lb

## 2012-12-24 DIAGNOSIS — M2550 Pain in unspecified joint: Secondary | ICD-10-CM

## 2012-12-24 DIAGNOSIS — F329 Major depressive disorder, single episode, unspecified: Secondary | ICD-10-CM

## 2012-12-24 NOTE — Telephone Encounter (Signed)
Patient is being seen today

## 2012-12-24 NOTE — Telephone Encounter (Signed)
Left message for pt to return so can find out why she needs referral.

## 2012-12-24 NOTE — Progress Notes (Signed)
Subjective:    Patient ID: [name], female    DOB: Dec 25, 1945, 67 y.o.   MRN: [id number]  HPI Mrs. Noll presents for diffuse body pain, stiffness and her body hurts all over. She has gotten to the point where she has trouble walking and is using a cane. She still has sciatica. Her other medical problems although bothersome are without acute change.  Past Medical History  Diagnosis Date  . Anxiety   . Depression   . Thyroid disease   . Allergy   . GERD (gastroesophageal reflux disease)   . IBS (irritable bowel syndrome)   . Ulcerative colitis   . Attention deficit disorder without mention of hyperactivity   . Unspecified asthma(493.90)   . Osteoarthrosis, unspecified whether generalized or localized, unspecified site   . Unspecified essential hypertension   . Other and unspecified hyperlipidemia   . Diverticulosis   . Colitis 12/05/2010   Past Surgical History  Procedure Laterality Date  . Cholecystectomy    . Colonoscopy    . Upper gastrointestinal endoscopy    . Back surgery      fluid removed from between disks  . Shoulder surgery      right shoulder 3x   . Total shoulder arthroplasty    . Toe surgery      right big toe  . Cystectomy      left under arm   Family History  Problem Relation Age of Onset  . Hypertension Father   . Colon cancer Father 93  . Arthritis Father   . Heart disease Father     CHF  . Diabetes Neg Hx    History   Social History  . Marital Status: Married    Spouse Name: N/A    Number of Children: 3  . Years of Education: 16   Occupational History  . interior decorator    Social History Main Topics  . Smoking status: Former Smoker    Quit date: 10/30/1987  . Smokeless tobacco: Never Used  . Alcohol Use: 0.5 oz/week    1 drink(s) per week     Comment: once a week  . Drug Use: No  . Sexually Active: Not on file   Other Topics Concern  . Not on file   Social History Narrative   [employer]; post-graduate study  [employer].  married '68.  3 children: 2 daughters - '70, '72; 1 son '82; 6 grand-children. Lost her mother 2008, elderly father lives alone-in [place] died in 09-15-22. owner/operator interior Agricultural consultant business. SO-in good health.  Marriage is a work in progress    Current Outpatient Prescriptions on File Prior to Visit  Medication Sig Dispense Refill  . ALPRAZolam (XANAX) 0.5 MG tablet Take 0.5 mg by mouth as needed for sleep.      [name] buPROPion (WELLBUTRIN XL) 300 MG 24 hr tablet TAKE ONE TABLET EACH DAY  30 tablet  6  . diphenoxylate-atropine (LOMOTIL) 2.5-0.025 MG per tablet Take 1 tablet by mouth 4 (four) times daily as needed.      [name] FLUoxetine (PROZAC) 20 MG capsule Take 40 mg by mouth daily.       [name] ketoprofen (ORUDIS) 75 MG capsule Take 1 capsule (75 mg total) by mouth 3 (three) times daily as needed for pain.  90 capsule  1  . lamoTRIgine (LAMICTAL) 200 MG tablet Take 200 mg by mouth daily.        . Mesalamine (ASACOL HD) 800 MG TBEC Take 2  tablets by mouth 2 (two) times daily.       . methylphenidate (CONCERTA) 27 MG CR tablet Take 1 tablet (27 mg total) by mouth every morning.    0  . metoCLOPramide (REGLAN) 5 MG tablet Take 5 mg by mouth every 6 (six) hours as needed.      . pantoprazole (PROTONIX) 40 MG tablet Take 40 mg by mouth daily.      . pirbuterol (MAXAIR) 200 MCG/INH inhaler Inhale 2 puffs into the lungs as needed. As needed for asthma/allergies.      . promethazine (PHENERGAN) 12.5 MG tablet Take by mouth every 6 (six) hours as needed.      [name] SYNTHROID 100 MCG tablet TAKE ONE TABLET EVERY MORNING  30 tablet  5  . traMADol (ULTRAM) 50 MG tablet Take 1-2 tablets (50-100 mg total) by mouth 2 (two) times daily as needed for pain.  100 tablet  1  . zolpidem (AMBIEN CR) 12.5 MG CR tablet Take 1 tablet (12.5 mg total) by mouth at bedtime as needed.  30 tablet  5  . [DISCONTINUED] losartan-hydrochlorothiazide (HYZAAR) 50-12.5 MG per tablet Take 1 tablet by mouth daily.      .  [DISCONTINUED] simvastatin (ZOCOR) 20 MG tablet Take 20 mg by mouth every evening.       No current facility-administered medications on file prior to visit.      Review of Systems System review is negative for any constitutional, cardiac, pulmonary, GI or neuro symptoms or complaints other than as described in the HPI.     Objective:   Physical Exam Filed Vitals:   12/24/12 1658  BP: 150/80  Pulse: 85  Temp: 97.4 F (36.3 C)   Wt Readings from Last 3 Encounters:  12/24/12 150 lb 12.8 oz (68.402 kg)  12/09/12 153 lb 8 oz (69.627 kg)  11/05/12 158 lb (71.668 kg)   Gen'l- a bit haggard appearing white woman who is uncomfortable but in no distress Cor - 2+ radial pulse, RRR Pulm - normal respirations MSK - no obviously enlarged, swollen or erythematous joints. She does use a cane for walking, mostly for balance.  Psych - well composed and rational.        Assessment & Plan:

## 2012-12-27 NOTE — Assessment & Plan Note (Signed)
Progressive arthralgias and myalgias that are activity limiting. She presented because she had request referral to neurology. She does have an up-coming appointment with Dr. Lora Havens for rheumatology. We discussed her symptoms at length and she does not appear to have any problems that are related to the CNS. Her neurosurgeon had set up the appointment with Dr. Ouida Sills.   Plan At this time she is advised to keep her appointment with Dr. Ouida Sills for rheumatology evaluation.  If there is no explanation for her symptoms, including weakness, can consider neuro eval.

## 2012-12-27 NOTE — Assessment & Plan Note (Signed)
Discussed the use of cymbalta for diffuse pain, especially for fibromyalgia.   Plan No addition of psychotropic medication w/o consult with Dr. Sabra Heck, her psychiatrist.

## 2012-12-28 ENCOUNTER — Ambulatory Visit (INDEPENDENT_AMBULATORY_CARE_PROVIDER_SITE_OTHER): Payer: Medicare Other | Admitting: Internal Medicine

## 2012-12-28 ENCOUNTER — Other Ambulatory Visit: Payer: Self-pay | Admitting: *Deleted

## 2012-12-28 ENCOUNTER — Other Ambulatory Visit: Payer: Medicare Other

## 2012-12-28 DIAGNOSIS — A6920 Lyme disease, unspecified: Secondary | ICD-10-CM

## 2012-12-28 DIAGNOSIS — W57XXXA Bitten or stung by nonvenomous insect and other nonvenomous arthropods, initial encounter: Secondary | ICD-10-CM | POA: Diagnosis not present

## 2012-12-28 NOTE — Progress Notes (Signed)
  Subjective:    Patient ID: [name], female    DOB: 1946/04/02, 67 y.o.   MRN: [id number]  HPI Patient presents for lab - lyme's testing   Review of Systems     Objective:   Physical Exam        Assessment & Plan:

## 2012-12-29 ENCOUNTER — Telehealth: Payer: Self-pay | Admitting: *Deleted

## 2012-12-29 ENCOUNTER — Other Ambulatory Visit: Payer: Self-pay | Admitting: Internal Medicine

## 2012-12-29 LAB — LYME, TOTAL AB TEST/REFLEX: Lyme Ab: <0.91 index (ref 0.00–0.90)

## 2012-12-29 MED ORDER — CELECOXIB 200 MG PO CAPS: 200.0000 mg | ORAL_CAPSULE | Freq: Every day | ORAL | Status: DC

## 2012-12-29 NOTE — Telephone Encounter (Signed)
Filled Celebrex 200mg , #30 1 po QD, 11 refills as per MD order.

## 2012-12-29 NOTE — Telephone Encounter (Signed)
K. Celebrex 200 mg #30, sig 1 po qd, refill 11

## 2012-12-29 NOTE — Telephone Encounter (Signed)
Pt called states she liked the Celebrex and requests a Rx for it to be sent to [facility].

## 2012-12-31 ENCOUNTER — Telehealth: Payer: Self-pay

## 2012-12-31 ENCOUNTER — Ambulatory Visit: Payer: Medicare Other | Admitting: Internal Medicine

## 2012-12-31 DIAGNOSIS — M7750 Other enthesopathy of unspecified foot: Secondary | ICD-10-CM | POA: Diagnosis not present

## 2012-12-31 NOTE — Telephone Encounter (Signed)
Message copied by Noreene Larsson on Thu Dec 31, 2012  8:21 AM ------      Message from: Illene Regulus E      Created: Wed Dec 30, 2012 11:17 PM       Please call patient: lyme's titre is negative. thanks ------

## 2012-12-31 NOTE — Telephone Encounter (Signed)
Patient notified of negative results.

## 2013-01-05 ENCOUNTER — Telehealth: Payer: Self-pay

## 2013-01-05 NOTE — Telephone Encounter (Signed)
Phone call from patient. She says about a year ago you recommended a psychologist for her husband and wants to know the name/address if you remember. I looked in his chart and it did not show anything. He never saw the particular doctor, the doctor was just recommended. I don't see any information in her chart either. Please advise. Thanks

## 2013-01-13 DIAGNOSIS — M2550 Pain in unspecified joint: Secondary | ICD-10-CM | POA: Diagnosis not present

## 2013-01-13 DIAGNOSIS — M79609 Pain in unspecified limb: Secondary | ICD-10-CM | POA: Diagnosis not present

## 2013-01-13 NOTE — Telephone Encounter (Signed)
See phone note Horace Porteous from earlier

## 2013-01-14 NOTE — Telephone Encounter (Signed)
Noted and wife advised per note on her husbands chart.

## 2013-01-26 DIAGNOSIS — K5100 Ulcerative (chronic) pancolitis without complications: Secondary | ICD-10-CM | POA: Diagnosis not present

## 2013-01-26 DIAGNOSIS — K639 Disease of intestine, unspecified: Secondary | ICD-10-CM | POA: Diagnosis not present

## 2013-02-02 ENCOUNTER — Telehealth: Payer: Self-pay | Admitting: Internal Medicine

## 2013-02-02 DIAGNOSIS — K5100 Ulcerative (chronic) pancolitis without complications: Secondary | ICD-10-CM | POA: Diagnosis not present

## 2013-02-02 NOTE — Telephone Encounter (Signed)
Pt states she thinks she has "screwed up" and she wants to make sure Dr. Marina Goodell is ok with what she has done. She was seen by Dr. Dareen Piano and received Remicade for "Rheumatoid Colitis." She is concerned and states she does not know what she is to do next, wants to make sure she is not messing up by getting the Remicade. Please advise.

## 2013-02-07 NOTE — Telephone Encounter (Signed)
I don't know what she is alluding to... Who is Dr Dareen Piano? Does she mean arthritis? Are the office records I can review? Get records and then set her up for an ROV to discuss.

## 2013-02-08 NOTE — Telephone Encounter (Signed)
Pt was referred to Dr. Tobie Lords by her PCP. Called and requested records be faxed over from her visit. Pt scheduled to see Dr. Henrene Pastor 02/26/13@9 :15am. Pt aware of appt.

## 2013-02-08 NOTE — Telephone Encounter (Signed)
Records received

## 2013-02-17 DIAGNOSIS — K5100 Ulcerative (chronic) pancolitis without complications: Secondary | ICD-10-CM | POA: Diagnosis not present

## 2013-02-24 ENCOUNTER — Other Ambulatory Visit: Payer: Self-pay | Admitting: Internal Medicine

## 2013-02-26 ENCOUNTER — Ambulatory Visit (INDEPENDENT_AMBULATORY_CARE_PROVIDER_SITE_OTHER): Payer: Medicare Other | Admitting: Internal Medicine

## 2013-02-26 ENCOUNTER — Encounter: Payer: Self-pay | Admitting: Internal Medicine

## 2013-02-26 VITALS — BP 124/64 | HR 76 | Ht 64.75 in | Wt 149.1 lb

## 2013-02-26 DIAGNOSIS — Z86010 Personal history of colonic polyps: Secondary | ICD-10-CM

## 2013-02-26 DIAGNOSIS — K219 Gastro-esophageal reflux disease without esophagitis: Secondary | ICD-10-CM | POA: Diagnosis not present

## 2013-02-26 DIAGNOSIS — K5190 Ulcerative colitis, unspecified, without complications: Secondary | ICD-10-CM

## 2013-02-26 NOTE — Patient Instructions (Addendum)
Please follow up with Dr. Perry in 6 months 

## 2013-02-26 NOTE — Progress Notes (Signed)
HISTORY OF PRESENT ILLNESS:  [name] is a 67 y.o. female with universal ulcerative colitis IBS, and adenomatous colon polyps. Patient was diagnosed with ulcerative colitis (mild to moderate) in December of 2013. She had been treated successfully with prednisone but intolerable side effects. She has been on mesalamine therapy and was last evaluated 12/09/2012. At that time, I was interested in having her initiate either immunomodulators therapy or biologic therapy. After and extensive discussion, she was not interested. She seemed to be doing moderately well, we continued mesalamine at 3.2 g daily. She tells me that she is taking 4.8 g daily. In the interim, she was referred to dermatology for significant arthritic pain. She was evaluated by Dr. Ouida Sills in July and was felt to have arthritis associated with inflammatory bowel disease. Subsequently started on Remicade therapy. She has received her first 2 doses without issues and is anticipating the third. After begin Remicade therapy, she contacted the office to see my opinion regarding this in reference to her inflammatory bowel disease. She did not realize that this is a therapy for inflammatory bowel disease. She did not realize that this was the medication we recommended. Any event, asked to come in to discuss further and see how she is doing. Overall, she seems to be doing well. She reports 1-2 grossly formed bowel movements daily. No blood or mucus. No abdominal pain. Her joint aches have improved. No appreciable medication side effects. She continues on Protonix for GERD without symptoms  REVIEW OF SYSTEMS:  All non-GI ROS negative except for sinus and allergy, fatigue, muscle cramps, insomnia, joint aches  Past Medical History  Diagnosis Date  . Anxiety   . Depression   . Thyroid disease   . Allergy   . GERD (gastroesophageal reflux disease)   . IBS (irritable bowel syndrome)   . Ulcerative colitis   . Attention deficit disorder  without mention of hyperactivity   . Unspecified asthma(493.90)   . Osteoarthrosis, unspecified whether generalized or localized, unspecified site   . Unspecified essential hypertension   . Other and unspecified hyperlipidemia   . Diverticulosis   . Colitis 12/05/2010    Past Surgical History  Procedure Laterality Date  . Cholecystectomy    . Colonoscopy    . Upper gastrointestinal endoscopy    . Back surgery      fluid removed from between disks  . Shoulder surgery      right shoulder 3x   . Total shoulder arthroplasty    . Toe surgery      right big toe  . Cystectomy      left under arm    Social History [name]  reports that she quit smoking about 25 years ago. She has never used smokeless tobacco. She reports that she drinks about 0.5 ounces of alcohol per week. She reports that she does not use illicit drugs.  family history includes Arthritis in her father; Colon cancer (age of onset: 30) in her father; Heart disease in her father; Hypertension in her father. There is no history of Diabetes.  Allergies  Allergen Reactions  . Amoxicillin-Pot Clavulanate Other (See Comments)    GI upset  . Erythromycin   . Morphine And Related        PHYSICAL EXAMINATION: Vital signs: BP 124/64  Pulse 76  Ht 5' 4.75" (1.645 m)  Wt 149 lb 2 oz (67.643 kg)  BMI 25 kg/m2 General: Well-developed, well-nourished, no acute distress HEENT: Sclerae are anicteric, conjunctiva pink. Oral  mucosa intact Lungs: Clear Heart: Regular Abdomen: soft, nontender, nondistended, no obvious ascites, no peritoneal signs, normal bowel sounds. No organomegaly. Extremities: No edema Psychiatric: alert and oriented x3. Cooperative   ASSESSMENT:  #1. Universal ulcerative colitis diagnosed December 2013. Remains on mesalamine therapy. Recently started on Remicade, by rheumatology, for arthritis associated with inflammatory bowel disease. Seems to be doing well clinically. I am pleased. #2.  GERD. Under control with PPI #3. History of adenomatous colon polyps. Due for surveillance around December 2018  PLAN:  #1. Continue mesalamine 4.8 g daily #2. Continue Remicade infusions under the direction of Dr. Ouida Sills #3. GI office followup in 3 months. Interval followup as needed #4. Surveillance colonoscopy 2018

## 2013-03-16 DIAGNOSIS — K5100 Ulcerative (chronic) pancolitis without complications: Secondary | ICD-10-CM | POA: Diagnosis not present

## 2013-03-16 DIAGNOSIS — K639 Disease of intestine, unspecified: Secondary | ICD-10-CM | POA: Diagnosis not present

## 2013-04-03 DIAGNOSIS — F064 Anxiety disorder due to known physiological condition: Secondary | ICD-10-CM | POA: Diagnosis not present

## 2013-04-06 DIAGNOSIS — M79609 Pain in unspecified limb: Secondary | ICD-10-CM | POA: Diagnosis not present

## 2013-04-06 DIAGNOSIS — IMO0002 Reserved for concepts with insufficient information to code with codable children: Secondary | ICD-10-CM | POA: Diagnosis not present

## 2013-04-06 DIAGNOSIS — Z96619 Presence of unspecified artificial shoulder joint: Secondary | ICD-10-CM | POA: Diagnosis not present

## 2013-04-06 DIAGNOSIS — M1710 Unilateral primary osteoarthritis, unspecified knee: Secondary | ICD-10-CM | POA: Diagnosis not present

## 2013-04-20 ENCOUNTER — Other Ambulatory Visit: Payer: Medicare Other

## 2013-04-20 ENCOUNTER — Encounter: Payer: Self-pay | Admitting: Internal Medicine

## 2013-04-20 ENCOUNTER — Ambulatory Visit (INDEPENDENT_AMBULATORY_CARE_PROVIDER_SITE_OTHER): Payer: Medicare Other | Admitting: Internal Medicine

## 2013-04-20 VITALS — BP 150/80 | HR 81 | Temp 98.1°F | Wt 143.0 lb

## 2013-04-20 DIAGNOSIS — R3129 Other microscopic hematuria: Secondary | ICD-10-CM

## 2013-04-20 NOTE — Patient Instructions (Signed)
Good to see you.  At Dr. Tonette Bihari office your urinalysis with microscopic exam did reveal 0 white blood cells, no bacteria, 10-20 red blood cells per high powered field. Previous urinalyses were negative for red blood cells so this is a new finding. If confirmed this is a cardinal sign that requires further evaluation to rule out any serious underlying bladder or urinary track problem. Microscopic hematuria would not make you feel bad nor would it be a cause of weight loss.  Plan Repeat urinalysis today  If microscopic hematuria is confirmed will need a referral to urology which we will set up for you.

## 2013-04-20 NOTE — Assessment & Plan Note (Signed)
Discovered as part of routine screening for side effects of Remicade at Dr. Audelia Acton Anderson's office. Discussed implications of painless microscopic hematuria.  Plan Repeat U/A  If positive will refer to Urology

## 2013-04-20 NOTE — Progress Notes (Signed)
Subjective:    Patient ID: [name], female    DOB: 1945-07-08, 67 y.o.   MRN: [id number]  HPI In the interval she has seen by Dr. 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 As part of screening by Dr. Tonette Bihari office she had normal lab but was noted to have microscopic hematuria. Verified with Dr. Tonette Bihari office that this was a full microscopic U/A: WBC - 0; Bacteria - none; RBC 10-20. Quick search for connection between Remicade and microscopic hematuria - not productive search. She has been totally asymptomatic.  Past Medical History  Diagnosis Date  . Anxiety   . Depression   . Thyroid disease   . Allergy   . GERD (gastroesophageal reflux disease)   . IBS (irritable bowel syndrome)   . Ulcerative colitis   . Attention deficit disorder without mention of hyperactivity   . Unspecified asthma(493.90)   . Osteoarthrosis, unspecified whether generalized or localized, unspecified site   . Unspecified essential hypertension   . Other and unspecified hyperlipidemia   . Diverticulosis   . Colitis 12/05/2010   Past Surgical History  Procedure Laterality Date  . Cholecystectomy    . Colonoscopy    . Upper gastrointestinal endoscopy    . Back surgery      fluid removed from between disks  . Shoulder surgery      right shoulder 3x   . Total shoulder arthroplasty    . Toe surgery      right big toe  . Cystectomy      left under arm   Family History  Problem Relation Age of Onset  . Hypertension Father   . Colon cancer Father 74  . Arthritis Father   . Heart disease Father     CHF  . Diabetes Neg Hx    History   Social History  . Marital Status: Married    Spouse Name: N/A    Number of Children: 3  . Years of Education: 16   Occupational History  . interior decorator    Social History Main Topics  . Smoking status: Former Smoker    Quit date: 10/30/1987  . Smokeless tobacco: Never Used  . Alcohol Use: 0.5 oz/week    1 drink(s)  per week     Comment: once a week  . Drug Use: No  . Sexual Activity: Not on file   Other Topics Concern  . Not on file   Social History Narrative   [employer]; post-graduate study [employer].  married '68.  3 children: 2 daughters - '70, '72; 1 son '82; 6 grand-children. Lost her mother 2008, elderly father lives alone-in [place] died in 2022/10/02. owner/operator interior Agricultural consultant business. SO-in good health.  Marriage is a work in progress     Current Outpatient Prescriptions on File Prior to Visit  Medication Sig Dispense Refill  . ALPRAZolam (XANAX) 0.5 MG tablet Take 0.5 mg by mouth as needed for sleep.      [name] buPROPion (WELLBUTRIN XL) 300 MG 24 hr tablet TAKE ONE TABLET EACH DAY  30 tablet  6  . celecoxib (CELEBREX) 200 MG capsule Take 1 capsule (200 mg total) by mouth daily.  30 capsule  11  . diphenoxylate-atropine (LOMOTIL) 2.5-0.025 MG per tablet Take 1 tablet by mouth 4 (four) times daily as needed.      [name] FLUoxetine (PROZAC) 20 MG capsule Take 40 mg by mouth daily.       [name]  inFLIXimab (REMICADE) 100 MG injection Inject into the vein. Every 2 months      . lamoTRIgine (LAMICTAL) 200 MG tablet Take 200 mg by mouth daily.        . Mesalamine 800 MG TBEC Take 3 tablets by mouth 2 (two) times daily.      . methylphenidate (CONCERTA) 27 MG CR tablet Take 1 tablet (27 mg total) by mouth every morning.    0  . metoCLOPramide (REGLAN) 5 MG tablet Take 5 mg by mouth every 6 (six) hours as needed.      . pantoprazole (PROTONIX) 40 MG tablet Take 40 mg by mouth daily.      . pirbuterol (MAXAIR) 200 MCG/INH inhaler Inhale 2 puffs into the lungs as needed. As needed for asthma/allergies.      . promethazine (PHENERGAN) 12.5 MG tablet Take by mouth every 6 (six) hours as needed.      [name] SYNTHROID 100 MCG tablet TAKE ONE TABLET EVERY MORNING  30 tablet  5  . traMADol (ULTRAM) 50 MG tablet Take 1-2 tablets (50-100 mg total) by mouth 2 (two) times daily as needed for pain.  100 tablet  1  .  zolpidem (AMBIEN CR) 12.5 MG CR tablet Take 1 tablet (12.5 mg total) by mouth at bedtime as needed.  30 tablet  5  . [DISCONTINUED] losartan-hydrochlorothiazide (HYZAAR) 50-12.5 MG per tablet Take 1 tablet by mouth daily.      . [DISCONTINUED] simvastatin (ZOCOR) 20 MG tablet Take 20 mg by mouth every evening.       No current facility-administered medications on file prior to visit.       Review of Systems System review is negative for any constitutional, cardiac, pulmonary, GI or neuro symptoms or complaints other than as described in the HPI.     Objective:   Physical Exam Filed Vitals:   04/20/13 1040  BP: 150/80  Pulse: 81  Temp: 98.1 F (36.7 C)   Wt Readings from Last 3 Encounters:  04/20/13 143 lb (64.864 kg)  02/26/13 149 lb 2 oz (67.643 kg)  12/24/12 150 lb 12.8 oz (68.402 kg)   Gen'l- pleasant woman who looks like she has lost weight HEENT- C&S clear Cor - RRR Pulm - normal respirations        Assessment & Plan:

## 2013-04-21 LAB — URINALYSIS, MICROSCOPIC ONLY
Bacteria, UA: NONE SEEN
Casts: NONE SEEN
Squamous Epithelial / LPF: NONE SEEN

## 2013-04-25 DIAGNOSIS — M25519 Pain in unspecified shoulder: Secondary | ICD-10-CM | POA: Diagnosis not present

## 2013-04-25 DIAGNOSIS — S42199A Fracture of other part of scapula, unspecified shoulder, initial encounter for closed fracture: Secondary | ICD-10-CM | POA: Diagnosis not present

## 2013-04-25 DIAGNOSIS — Z79899 Other long term (current) drug therapy: Secondary | ICD-10-CM | POA: Diagnosis not present

## 2013-04-25 DIAGNOSIS — W1809XA Striking against other object with subsequent fall, initial encounter: Secondary | ICD-10-CM | POA: Diagnosis not present

## 2013-04-25 DIAGNOSIS — S42109A Fracture of unspecified part of scapula, unspecified shoulder, initial encounter for closed fracture: Secondary | ICD-10-CM | POA: Diagnosis not present

## 2013-04-27 DIAGNOSIS — M25519 Pain in unspecified shoulder: Secondary | ICD-10-CM | POA: Diagnosis not present

## 2013-04-29 ENCOUNTER — Other Ambulatory Visit: Payer: Self-pay

## 2013-05-18 DIAGNOSIS — K5100 Ulcerative (chronic) pancolitis without complications: Secondary | ICD-10-CM | POA: Diagnosis not present

## 2013-05-21 ENCOUNTER — Telehealth: Payer: Self-pay

## 2013-05-21 ENCOUNTER — Other Ambulatory Visit: Payer: Self-pay | Admitting: Internal Medicine

## 2013-05-21 DIAGNOSIS — Z1239 Encounter for other screening for malignant neoplasm of breast: Secondary | ICD-10-CM

## 2013-05-21 NOTE — Telephone Encounter (Signed)
Phone call to patient and left a message that she is due for a Prevnar vaccine.

## 2013-05-24 ENCOUNTER — Encounter: Payer: Self-pay | Admitting: Internal Medicine

## 2013-05-24 ENCOUNTER — Ambulatory Visit (INDEPENDENT_AMBULATORY_CARE_PROVIDER_SITE_OTHER): Payer: Medicare Other | Admitting: Internal Medicine

## 2013-05-24 VITALS — BP 154/86 | HR 80 | Temp 98.2°F | Wt 141.0 lb

## 2013-05-24 DIAGNOSIS — M436 Torticollis: Secondary | ICD-10-CM

## 2013-05-24 DIAGNOSIS — I10 Essential (primary) hypertension: Secondary | ICD-10-CM

## 2013-05-24 DIAGNOSIS — R279 Unspecified lack of coordination: Secondary | ICD-10-CM | POA: Diagnosis not present

## 2013-05-24 MED ORDER — CYCLOBENZAPRINE HCL 10 MG PO TABS: 10.0000 mg | ORAL_TABLET | Freq: Three times a day (TID) | ORAL | Status: DC | PRN

## 2013-05-24 NOTE — Progress Notes (Signed)
Pre visit review using our clinic review tool, if applicable. No additional management support is needed unless otherwise documented below in the visit note. 

## 2013-05-24 NOTE — Patient Instructions (Addendum)
1) Neck Pain: Your pain is most likely due to acute torticollis. The primary treatment is muscle relaxants. We will prescribe you flexeril 10 mg. Take flexeril three times a day. Your pain should resolve with time.   Try to perform simple neck exercises such as neck rolls.    If no relief or improvement after 4 - 5 days, let us know.   2) Difficulty walking: Your gait may be acutely worse because you have difficulty moving your neck. If you can't move your neck, you may have difficulty balancing. Please use a cane to help with your gait. We will referral to Dr. Arbutus Leas (a neurologist) for the problem with your gait.   Torticollis, Acute You have suddenly (acutely) developed a twisted neck (torticollis). This is usually a self-limited condition. CAUSES  Acute torticollis may be caused by malposition, trauma or infection. Most commonly, acute torticollis is caused by sleeping in an awkward position. Torticollis may also be caused by the flexion, extension or twisting of the neck muscles beyond their normal position. Sometimes, the exact cause may not be known. SYMPTOMS  Usually, there is pain and limited movement of the neck. Your neck may twist to one side. DIAGNOSIS  The diagnosis is often made by physical examination. X-rays, CT scans or MRIs may be done if there is a history of trauma or concern of infection. TREATMENT  For a common, stiff neck that develops during sleep, treatment is focused on relaxing the contracted neck muscle. Medications (including shots) may be used to treat the problem. Most cases resolve in several days. Torticollis usually responds to conservative physical therapy. If left untreated, the shortened and spastic neck muscle can cause deformities in the face and neck. Rarely, surgery is required. HOME CARE INSTRUCTIONS   Use over-the-counter and prescription medications as directed by your caregiver.  Do stretching exercises and massage the neck as directed by your  caregiver.  Follow up with physical therapy if needed and as directed by your caregiver. SEEK IMMEDIATE MEDICAL CARE IF:   You develop difficulty breathing or noisy breathing (stridor).  You drool, develop trouble swallowing or have pain with swallowing.  You develop numbness or weakness in the hands or feet.  You have changes in speech or vision.  You have problems with urination or bowel movements.  You have difficulty walking.  You have a fever.  You have increased pain. MAKE SURE YOU:   Understand these instructions.  Will watch your condition.  Will get help right away if you are not doing well or get worse. Document Released: 06/07/2000 Document Revised: 09/02/2011 Document Reviewed: 07/19/2009 [facility] Patient Information 2014 [place], [place].

## 2013-05-24 NOTE — Progress Notes (Signed)
Subjective:     Patient ID: [name], female   DOB: [birth year], 67 y.o.   MRN: [id number]  HPI [name] is a 67 yo female with PMH of HTN, degenerative disc disease and osteoarthritis who presents today with a week history of a stiff neck. Her stiff neck began a week ago.  Her pain began suddenly and she initially could not move her neck. Both sides of her neck bothered her. She went to bed with her neck bothering her and she woke up unable to get out of bed. . She initially had a shooting sharp pain in the back of her head and neck. Her pain started in the back of her head and has spread forward to her neck and jaw Her pain is somewhat improved now, but still has some occasional sharp, shooting pain in her necks. Her pain is worse on the left side of her neck. Movement aggravates her pain.   She has been using a heating pad and icy hot on her shoulders which slightly improved symptoms. She has had some difficulty walking. She fell last week. She fell again this morning because she felt off balance. She reports she has had problems with her balance for a long time, but this is much worse. She is unable to walk a straight line she states, which she could before.   She denies any pain, numbness or tingling in her arms. She has difficulty concentrating. She denies any headache, nausea or vomiting.   Review of Systems Negative except as mentioned in HPI.     Objective:   Physical Exam Gen: Well appearing woman in no acute distress HEENT: MMM. Sclerae antiicteric. Normal tympanic membranes. Patent nares. No thyromegaly. No lymphadenopathy.  CV: RRR. No murmurs, rubs or gallops.  Pulm: Normal work of breathing. Lungs clear to auscultation bilaterally.  MSK: Neck tender to palpation (L > R). Limited neck mobility. 5/5 hand grip strength. 5/5 Triceps and biceps strength. 5/5 shoulder strength.  Neuro: 2+ biceps and patellar reflexes. No cogwheel rigidity. Negative romberg. Swaying gait.      Assessment and Plan:     [name] is a 67 yo female with PMH of HTN, degenerative disc disease and osteoarthritis who presents today with a week history of a stiff neck likely due to acute torticollis.   1) Neck Pain: Most likely due to acute torticollis. Prescribe flexeril 10 mg TID. Advised patient to perform simple neck exercises to loosen her neck muscles.   2) Difficulty walking: Referral to Dr. Arbutus Leas, a neurologist, for difficulty walking. She was recommended to use a cane to avoid falling.

## 2013-05-25 DIAGNOSIS — S42109A Fracture of unspecified part of scapula, unspecified shoulder, initial encounter for closed fracture: Secondary | ICD-10-CM | POA: Diagnosis not present

## 2013-05-25 NOTE — Assessment & Plan Note (Signed)
Worsening problems with gait and balance since the onset of torticollis. She had previously seen Dr. Fabio Asa, however he left the practice before fully developing a diagnosis and treatment plan for her ataxia.  Plan Refer to Dr. Carles Collet for movement disorder/neuro consultation.

## 2013-05-25 NOTE — Assessment & Plan Note (Signed)
BP Readings from Last 3 Encounters:  05/24/13 154/86  04/20/13 150/80  02/26/13 124/64   subopitmal control.  Plan At next ov if still running elevated SBP will start a diuretic

## 2013-05-26 DIAGNOSIS — M62838 Other muscle spasm: Secondary | ICD-10-CM | POA: Diagnosis not present

## 2013-05-26 DIAGNOSIS — K5289 Other specified noninfective gastroenteritis and colitis: Secondary | ICD-10-CM | POA: Diagnosis not present

## 2013-05-26 DIAGNOSIS — K5100 Ulcerative (chronic) pancolitis without complications: Secondary | ICD-10-CM | POA: Diagnosis not present

## 2013-06-02 ENCOUNTER — Encounter: Payer: Self-pay | Admitting: Internal Medicine

## 2013-06-02 ENCOUNTER — Ambulatory Visit (INDEPENDENT_AMBULATORY_CARE_PROVIDER_SITE_OTHER): Payer: Medicare Other | Admitting: Internal Medicine

## 2013-06-02 VITALS — BP 120/82 | HR 83 | Temp 97.0°F | Wt 147.4 lb

## 2013-06-02 DIAGNOSIS — M436 Torticollis: Secondary | ICD-10-CM

## 2013-06-02 DIAGNOSIS — K117 Disturbances of salivary secretion: Secondary | ICD-10-CM

## 2013-06-02 MED ORDER — DIAZEPAM 2 MG PO TABS: 1.0000 mg | ORAL_TABLET | ORAL | Status: DC

## 2013-06-02 MED ORDER — MAGIC MOUTHWASH W/LIDOCAINE: 5.0000 mL | Freq: Four times a day (QID) | ORAL | Status: DC | PRN

## 2013-06-02 NOTE — Patient Instructions (Signed)
Please stop taking flexeril as this likely is causing your dry mouth. For your mouth ulcer, you can take orbase or orajel to help for your pain. We will send in a prescription for magic mouth wash to help with your mouth pain.  We will prescribe you diazepam to help with muscles spasms/neck pain instead of the flexeril. Start at 1 mg every 4 hours. You can titrate the dose as diazepam up to 4 mg every 4 hours if needed to help. Continue to use heat and perform the exercises for your neck.  Check with your dentist regarding your bite and see if your bite could be causing some of the pain you have with yawning.   Canker Sores  Canker sores are painful, open sores on the inside of the mouth and cheek. They may be white or yellow. The sores usually heal in 1 to 2 weeks. Women are more likely than men to have recurrent canker sores. CAUSES The cause of canker sores is not well understood. More than one cause is likely. Canker sores do not appear to be caused by certain types of germs (viruses or bacteria). Canker sores may be caused by:  An allergic reaction to certain foods.  Digestive problems.  Not having enough vitamin B12, folic acid, and iron.  Female sex hormones. Sores may come only during certain phases of a menstrual cycle. Often, there is improvement during pregnancy.  Genetics. Some people seem to inherit canker sore problems. Emotional stress and injuries to the mouth may trigger outbreaks, but not cause them.  DIAGNOSIS Canker sores are diagnosed by exam.  TREATMENT  Patients who have frequent bouts of canker sores may have cultures taken of the sores, blood tests, or allergy tests. This helps determine if their sores are caused by a poor diet, an allergy, or some other preventable or treatable disease.  Vitamins may prevent recurrences or reduce the severity of canker sores in people with poor nutrition.  Numbing ointments can relieve pain. These are available in drug stores  without a prescription.  Anti-inflammatory steroid mouth rinses or gels may be prescribed by your caregiver for severe sores.  Oral steroids may be prescribed if you have severe, recurrent canker sores. These strong medicines can cause many side effects and should be used only under the close direction of a dentist or physician.  Mouth rinses containing the antibiotic medicine may be prescribed. They may lessen symptoms and speed healing. Healing usually happens in about 1 or 2 weeks with or without treatment. Certain antibiotic mouth rinses given to pregnant women and young children can permanently stain teeth. Talk to your caregiver about your treatment. HOME CARE INSTRUCTIONS   Avoid foods that cause canker sores for you.  Avoid citrus juices, spicy or salty foods, and coffee until the sores are healed.  Use a soft-bristled toothbrush.  Chew your food carefully to avoid biting your cheek.  Apply topical numbing medicine to the sore to help relieve pain.  Apply a thin paste of baking soda and water to the sore to help heal the sore.  Only use mouth rinses or medicines for pain or discomfort as directed by your caregiver. SEEK MEDICAL CARE IF:   Your symptoms are not better in 1 week.  Your sores are still present after 2 weeks.  Your sores are very painful.  You have trouble breathing or swallowing.  Your sores come back frequently. Document Released: 10/05/2010 Document Revised: 10/05/2012 Document Reviewed: 10/05/2010 [employer] Patient Information 2014 [employer].  

## 2013-06-02 NOTE — Progress Notes (Signed)
Pre visit review using our clinic review tool, if applicable. No additional management support is needed unless otherwise documented below in the visit note. 

## 2013-06-02 NOTE — Progress Notes (Signed)
Subjective:     Patient ID: [name], female   DOB: 1945-12-01, 67 y.o.   MRN: [id number]  HPI [name] is a 67 yo female with PMH of HTN, degenerative disc disease and osteoarthritis who presents today for follow up on her "stiff neck" and "raw tongue."  She stopped taking flexeril because she did not like it. She took it for 5 days without any improvement. The pain pill improved her symptoms. She cannot look up or down and cannot turn her head. She has some improved range of motion and the right side of your neck is improved. She complains of a pain in the back of her left head/behind the ear that radiates to her ear. The horrible shooting pain she was having is no longer occuring. She now just has a pain sensation in her left neck.  She also has been having "dry mouth" which started Thursday or Friday. She is having some difficulty swallowing which began Saturday/Sunday. Her symptoms have not improved since stopping taking the flexeril Friday. She has sores on her mouth and tongue that have been present since Thursday or Friday. Her sores are extremely painful.   PMH, FamHx and SocHx reviewed for any changes and relevance. Current Outpatient Prescriptions on File Prior to Visit  Medication Sig Dispense Refill  . ALPRAZolam (XANAX) 0.5 MG tablet Take 0.5 mg by mouth as needed for sleep.      [name] buPROPion (WELLBUTRIN XL) 300 MG 24 hr tablet TAKE ONE TABLET EACH DAY  30 tablet  6  . celecoxib (CELEBREX) 200 MG capsule Take 1 capsule (200 mg total) by mouth daily.  30 capsule  11  . cyclobenzaprine (FLEXERIL) 10 MG tablet Take 1 tablet (10 mg total) by mouth 3 (three) times daily as needed for muscle spasms.  30 tablet  2  . diphenoxylate-atropine (LOMOTIL) 2.5-0.025 MG per tablet Take 1 tablet by mouth 4 (four) times daily as needed.      [name] FLUoxetine (PROZAC) 20 MG capsule Take 40 mg by mouth daily.       [name] inFLIXimab (REMICADE) 100 MG injection Inject into the vein. Every 2 months       . lamoTRIgine (LAMICTAL) 200 MG tablet Take 200 mg by mouth daily.        . Mesalamine 800 MG TBEC Take 3 tablets by mouth 2 (two) times daily.      . methylphenidate (CONCERTA) 27 MG CR tablet Take 1 tablet (27 mg total) by mouth every morning.    0  . metoCLOPramide (REGLAN) 5 MG tablet Take 5 mg by mouth every 6 (six) hours as needed.      . pantoprazole (PROTONIX) 40 MG tablet Take 40 mg by mouth daily.      . pirbuterol (MAXAIR) 200 MCG/INH inhaler Inhale 2 puffs into the lungs as needed. As needed for asthma/allergies.      . promethazine (PHENERGAN) 12.5 MG tablet Take by mouth every 6 (six) hours as needed.      [name] SYNTHROID 100 MCG tablet TAKE ONE TABLET EVERY MORNING  30 tablet  5  . traMADol (ULTRAM) 50 MG tablet Take 1-2 tablets (50-100 mg total) by mouth 2 (two) times daily as needed for pain.  100 tablet  1  . zolpidem (AMBIEN CR) 12.5 MG CR tablet Take 1 tablet (12.5 mg total) by mouth at bedtime as needed.  30 tablet  5  . [DISCONTINUED] losartan-hydrochlorothiazide (HYZAAR) 50-12.5 MG per tablet Take 1 tablet by  mouth daily.      . [DISCONTINUED] simvastatin (ZOCOR) 20 MG tablet Take 20 mg by mouth every evening.       No current facility-administered medications on file prior to visit.      Review of Systems Negative except as mentioned in the HPI.      Objective:   Physical Exam HEENT:  Antiicteric sclerae. EOM intact without nystagmus. Tympanic membranes are clear. Nares patent. No thromegaly. No lymphadenopathy. L TMJ slightly tender to palpation. No noticeable crepitus. Oropharynx - buccal membranes/palate w/o lesions, resolving shallow ulceration mucosal aspect lower lip; tongue normal coloration, loss of normal papillations, no ulcers or lesions noted, no fixed exudate or coating. CV: RRR. No murmurs, rubs or gallops. 2+ Radial pulses bilaterally. Pulm: Lungs clear to auscultation bilaterally. Normal work of breathing. No crackles, wheezes or rhonchi.  Neuro: AOx3.  CN II- XII intact. 5/5 shoulder shrug and grip bilaterally. Normal sensation to pin prick and light touch in upper and lower extremities.      Assessment and Plan:    [name] is a 67 yo female with PMH of HTN, degenerative disc disease and osteoarthritis who presents today with a week history of a stiff neck and dry mouth.  1) Acute torticollis: Stop flexeril. Start Valium for muscle spasms. Advised patient to start at 1 mg every 4 hours and that she could titrate up to 4 mg every 4 hours.  2) Dry mouth: Likely secondary to flexeril. Will stop this medication and substitute valium for muscle spasm.  Mouth pain is due to ulcers. Provided prescription for magic mouth wash to help control her mouth pain.  Advised patient to check with her dentist to see if TMJ could be causing the jaw pain she has been having.  3) Difficulty walking: She has an appointment scheduled with Dr. Allena Katz  (neurology) for January 2nd.

## 2013-06-05 DIAGNOSIS — F064 Anxiety disorder due to known physiological condition: Secondary | ICD-10-CM | POA: Diagnosis not present

## 2013-06-22 ENCOUNTER — Ambulatory Visit (INDEPENDENT_AMBULATORY_CARE_PROVIDER_SITE_OTHER): Payer: Medicare Other | Admitting: Nurse Practitioner

## 2013-06-22 ENCOUNTER — Telehealth: Payer: Self-pay | Admitting: *Deleted

## 2013-06-22 ENCOUNTER — Encounter: Payer: Self-pay | Admitting: Nurse Practitioner

## 2013-06-22 VITALS — BP 120/68 | HR 81 | Temp 98.4°F | Ht 66.0 in | Wt 144.2 lb

## 2013-06-22 DIAGNOSIS — J020 Streptococcal pharyngitis: Secondary | ICD-10-CM | POA: Diagnosis not present

## 2013-06-22 MED ORDER — LIDOCAINE 2.5 % MT LIQD: OROMUCOSAL | Status: DC

## 2013-06-22 MED ORDER — AMOXICILLIN 875 MG PO TABS: 875.0000 mg | ORAL_TABLET | Freq: Two times a day (BID) | ORAL | Status: DC

## 2013-06-22 NOTE — Patient Instructions (Signed)
Start antibiotic. Use lidocaine to soothe throat-gargle & spit. Also you may use benzocaine throat lozenges. Ibuprophen can help as well. Please let us know if your symptoms are not improved in 3-4 days. Feel better!  Strep Throat Strep throat is an infection of the throat caused by a bacteria named Streptococcus pyogenes. Your caregiver may call the infection streptococcal "tonsillitis" or "pharyngitis" depending on whether there are signs of inflammation in the tonsils or back of the throat. Strep throat is most common in children aged 5 15 years during the cold months of the year, but it can occur in people of any age during any season. This infection is spread from person to person (contagious) through coughing, sneezing, or other close contact. SYMPTOMS   Fever or chills.  Painful, swollen, red tonsils or throat.  Pain or difficulty when swallowing.  White or yellow spots on the tonsils or throat.  Swollen, tender lymph nodes or "glands" of the neck or under the jaw.  Red rash all over the body (rare). DIAGNOSIS  Many different infections can cause the same symptoms. A test must be done to confirm the diagnosis so the right treatment can be given. A "rapid strep test" can help your caregiver make the diagnosis in a few minutes. If this test is not available, a light swab of the infected area can be used for a throat culture test. If a throat culture test is done, results are usually available in a day or two. TREATMENT  Strep throat is treated with antibiotic medicine. HOME CARE INSTRUCTIONS   Gargle with 1 tsp of salt in 1 cup of warm water, 3 4 times per day or as needed for comfort.  Family members who also have a sore throat or fever should be tested for strep throat and treated with antibiotics if they have the strep infection.  Make sure everyone in your household washes their hands well.  Do not share food, drinking cups, or personal items that could cause the infection to  spread to others.  You may need to eat a soft food diet until your sore throat gets better.  Drink enough water and fluids to keep your urine clear or pale yellow. This will help prevent dehydration.  Get plenty of rest.  Stay home from school, daycare, or work until you have been on antibiotics for 24 hours.  Only take over-the-counter or prescription medicines for pain, discomfort, or fever as directed by your caregiver.  If antibiotics are prescribed, take them as directed. Finish them even if you start to feel better. SEEK MEDICAL CARE IF:   The glands in your neck continue to enlarge.  You develop a rash, cough, or earache.  You cough up green, yellow-brown, or bloody sputum.  You have pain or discomfort not controlled by medicines.  Your problems seem to be getting worse rather than better. SEEK IMMEDIATE MEDICAL CARE IF:   You develop any new symptoms such as vomiting, severe headache, stiff or painful neck, chest pain, shortness of breath, or trouble swallowing.  You develop severe throat pain, drooling, or changes in your voice.  You develop swelling of the neck, or the skin on the neck becomes red and tender.  You have a fever.  You develop signs of dehydration, such as fatigue, dry mouth, and decreased urination.  You become increasingly sleepy, or you cannot wake up completely. Document Released: 06/07/2000 Document Revised: 05/27/2012 Document Reviewed: 08/09/2010 [facility] Patient Information 2014 [place], [place].

## 2013-06-22 NOTE — Telephone Encounter (Signed)
[facility] called office concerning Lid 2.5% rx that was sent into the pharmacy. Pharmacist stated that they do not have this medication. The med that they have that comes close to this is Lidocaine 2 % oral topical solution that is very viscus. Pharmacist said that she didn't think this is what you wanted. Please advise?

## 2013-06-22 NOTE — Telephone Encounter (Signed)
Pharmacy notified ok to switch to Lid 2% w/same directions.

## 2013-06-22 NOTE — Progress Notes (Signed)
   Subjective:    Patient ID: [name], female    DOB: Jan 07, 1946, 67 y.o.   MRN: [id number]  Sore Throat  This is a new problem. The current episode started yesterday. The problem has been gradually worsening. The pain is worse on the left side. There has been no fever. The pain is severe. Associated symptoms include abdominal pain, coughing, headaches and neck pain (left sided chronic neck pain). Pertinent negatives include no congestion, diarrhea, shortness of breath, swollen glands or vomiting. Associated symptoms comments: nausea. She has had exposure to strep. She has tried nothing for the symptoms.      Review of Systems  Constitutional: Negative for fever, chills and fatigue.       Pt is imuno-compromised- has RA & takes Remicade.  HENT: Positive for sore throat (not wanting to eat or drink due to throat pain). Negative for congestion and postnasal drip.   Respiratory: Positive for cough. Negative for shortness of breath.   Gastrointestinal: Positive for abdominal pain. Negative for nausea, vomiting and diarrhea.  Musculoskeletal: Positive for neck pain (left sided chronic neck pain). Negative for arthralgias, back pain and myalgias.  Skin: Negative for rash.  Neurological: Positive for headaches.  Hematological: Negative for adenopathy.       Objective:   Physical Exam  Vitals reviewed. Constitutional: She is oriented to person, place, and time. She appears well-developed and well-nourished. No distress.  HENT:  Head: Normocephalic and atraumatic.  Right Ear: External ear normal.  Left Ear: External ear normal.  Mouth/Throat: No oropharyngeal exudate.  Red posterior pharynx, no tonsils.  Eyes: Conjunctivae are normal. Right eye exhibits no discharge. Left eye exhibits no discharge.  Neck: Normal range of motion. Neck supple. No thyromegaly present.  Cardiovascular: Normal rate and regular rhythm.   No murmur heard. Pulmonary/Chest: Effort normal and breath sounds  normal. No respiratory distress. She has no wheezes.  Lymphadenopathy:    She has no cervical adenopathy.  Neurological: She is alert and oriented to person, place, and time.  Skin: Skin is warm and dry.  Psychiatric: She has a normal mood and affect. Her behavior is normal. Thought content normal.          Assessment & Plan:  1. Streptococcal sore throat  - POCT rapid strep A- pos - Lidocaine 2.5 % LIQD; Gargle & spit prn sore throat.  Dispense: 15 mL; Refill: 1 - amoxicillin (AMOXIL) 875 MG tablet; Take 1 tablet (875 mg total) by mouth 2 (two) times daily.  Dispense: 20 tablet; Refill: 0 See pt instructions.

## 2013-06-22 NOTE — Progress Notes (Signed)
Pre-visit discussion using our clinic review tool. No additional management support is needed unless otherwise documented below in the visit note.  

## 2013-06-22 NOTE — Telephone Encounter (Signed)
Ok to substitute?

## 2013-06-25 ENCOUNTER — Ambulatory Visit (INDEPENDENT_AMBULATORY_CARE_PROVIDER_SITE_OTHER): Payer: Medicare Other | Admitting: Neurology

## 2013-06-25 ENCOUNTER — Encounter: Payer: Self-pay | Admitting: Neurology

## 2013-06-25 VITALS — BP 140/66 | HR 76 | Resp 20 | Ht 66.0 in | Wt 144.4 lb

## 2013-06-25 DIAGNOSIS — G609 Hereditary and idiopathic neuropathy, unspecified: Secondary | ICD-10-CM | POA: Diagnosis not present

## 2013-06-25 DIAGNOSIS — R413 Other amnesia: Secondary | ICD-10-CM | POA: Diagnosis not present

## 2013-06-25 DIAGNOSIS — W19XXXA Unspecified fall, initial encounter: Secondary | ICD-10-CM | POA: Diagnosis not present

## 2013-06-25 NOTE — Patient Instructions (Addendum)
1.  Check blood work today 2.  Formal neuropsychiatric testing  3.  EMG of the right leg 4.  Physical therapy for gait training and strongly encouraged gait assistance 6.  Return to clinic in 6-weeks

## 2013-06-25 NOTE — Progress Notes (Signed)
[facility] Clinic Note - Initial Visit   Date: 06/25/2013    [name] MRN: [id number] DOB: [name]   Dear Dr Linda Hedges:  Thank you for your kind referral of [name] for consultation of gait difficulty. Although her history is well known to you, please allow [place] to reiterate it for the purpose of our medical record. The patient was accompanied to the clinic by self.   History of Present Illness: [name] is a 68 y.o. right-handed Caucasian female with history of hypothyroidism, GERD, ulcerative colitis (diagnosed 2012, currently on remicade), rheumatoid arthritis, spinal stenosis s/p L5-S1 lamintectomy, asthma, depression, and depression, presenting for evaluation of gait problems, confusion, and leg weakness.    Around 2012, she started noticing that she would fall easily and her legs felt weak. She was seen here by Dr. Jacelyn Grip in October 2012 for the same complaints and sciatica and was being managed for left lumbar spine radiculopathy and possible peripheral neuropathy given history of ulcerative colitis.  She underwent EMG in 04/2011 by [facility] which was consistent with left S1 radiculopathy.  Imaging of her lumbar spine showed multilevel spondylosis and degenerative disc disease with impingement at all levels between L2 and S1.  She was referred to Dr. Vertell Limber, neurosurgeon, who recommended surgery, but patient would like surgery only as a last resort.  Conservative management with steroid epidural injections and physical therapy (2013) was pursued which alleviated pain, but did not improve her leg weakness and falls. Walking with a cane was recommended, but did not follow through.  Since then, she feels that her falls and gait unsteadiness has worsened.  She falls about 4-5 times per month and always falls forwards.  She does not have any prodromal lightheadedness, dizziness, changes in vision, or palpitations.   She can tell she is going to fall because she feels that her legs are weak and then she falls forwards. Leaning forward, backwards, or to the side too far will cause her to fall.  She is able to get up herself after falling without difficulty.  She continues to have mild low back pain. She had sustained significant injuries from her falls, including lip laceration last year and fractured left scapular (2014), and broken her toe.  She denies any muscle aches, dark-colored urine, or myalgias.  She has occasional numbness and tingling of her feet.  Denies changes in smell, vision, constipation, freezing spells, dry eyes, dry mouth, or early satiety.  She is very active at baseline and still works part-time as a Futures trader.    She also complains of short-term memory loss which has been ongoing for the past 3 years.  She is forgetting telephone numbers, cannot finish a sentence, or forgets tasks.  Denies getting lost or any recent car accidents.     Out-side paper records, electronic medical record, and images have been reviewed where available and summarized as:  Lyme antibody 12/28/2012:  Negative Component     Latest Ref Rng 11/05/2012  Vitamin B-12     211 - 911 pg/mL 578  Sed Rate     0 - 22 mm/hr 10  CK Total     7 - 177 U/L 151   Component     Latest Ref Rng 04/24/2012  Hemoglobin A1C     4.6 - 6.5 % 5.5  ANA     NEGATIVE NEG   MRI lumbar spine 04/19/2011:   Findings: Dextroconvex lumbar scoliosis noted.  As  on the prior exam, the lowest full intervertebral disk space is labeled L5-S1. If procedural intervention is to be performed, careful correlation with this numbering strategy is recommended.  The conus medullaris appears unremarkable. Conus level: L1-2.  Loss of intervertebral disc height that the L2-3, L4-5, and L5-S1 levels. Degenerative endplate findings are more striking at the L2-3 level.  There is 2 mm of anterior subluxation of L2 on L3.  Multiple T2 hyperintense left  renal lesions are likely cysts are only partially characterized. There is a significant rotary component of the dextroconvex lumbar scoliosis.  Additional findings at individual levels are as follows:  L1-2: Diffuse disc bulge and facet arthropathy noted. No impingement.  L2-3: Diffuse disc bulge noted along with a right inferior foraminal disc protrusion as well as a suspected left foraminal disc protrusion The AP diameter of the thecal sac is narrowed to 7 mm, compatible with moderate central stenosis. There is moderate left foraminal stenosis and left lateral extraforaminal disc material abuts the left L2 nerve in the lateral extraforaminal space. There is moderate bilateral subarticular lateral recess stenosis.  L3-4: Disc bulge noted with right foraminal disc protrusion. Degenerative facet encroachment noted, right greater left. There is mild to moderate central stenosis along with mild right foraminal stenosis. There is moderate right and borderline left subarticular lateral recess stenosis.  L4-5: Right greater than left facet arthropathy noted with diffuse disc osteophyte complex. In conjunction with the facet spurring there is moderate right foraminal stenosis as well as mild right subarticular lateral recess stenosis. The facet encroachment causes mild central stenosis.  L5-S1: Prior right laminectomy noted. Facet arthropathy and intervertebral spurring cause moderate right and mild left foraminal stenosis.   IMPRESSION:  1. Lumbar spondylosis and degenerative disc disease with impingement at all levels between L2 and S1.  MRI/A brain 03/25/2013: 1. No evidence of acute ischemia.  2. Nonspecific subcortical white matter changes supratentorially and in the paramedian pons. These probably representsequelae of chronic microvascular ischemia which may relate to hypertension and/or diabetes, with less likely possibility of a vasculitis or demyelinating process. Clinical correlation suggested.  3.  Probable sebaceous cyst in the left occipital subcutaneous region. 4. No occlusions, stenosis, dissections or aneurysms seen on the study.   Past Medical History  Diagnosis Date  . Anxiety   . Depression   . Thyroid disease   . Allergy   . GERD (gastroesophageal reflux disease)   . IBS (irritable bowel syndrome)   . Ulcerative colitis   . Attention deficit disorder without mention of hyperactivity   . Unspecified asthma(493.90)   . Osteoarthrosis, unspecified whether generalized or localized, unspecified site   . Unspecified essential hypertension   . Other and unspecified hyperlipidemia   . Diverticulosis   . Colitis 12/05/2010    Past Surgical History  Procedure Laterality Date  . Cholecystectomy    . Colonoscopy    . Upper gastrointestinal endoscopy    . Back surgery      fluid removed from between disks  . Shoulder surgery      right shoulder 3x   . Total shoulder arthroplasty    . Toe surgery      right big toe  . Cystectomy      left under arm     Medications:  Current Outpatient Prescriptions on File Prior to Visit  Medication Sig Dispense Refill  . ALPRAZolam (XANAX) 0.5 MG tablet Take 0.5 mg by mouth as needed for sleep.      [name] Alum &  Mag Hydroxide-Simeth (MAGIC MOUTHWASH W/LIDOCAINE) SOLN Take 5 mLs by mouth 4 (four) times daily as needed for mouth pain.  250 mL  2  . amoxicillin (AMOXIL) 875 MG tablet Take 1 tablet (875 mg total) by mouth 2 (two) times daily.  20 tablet  0  . buPROPion (WELLBUTRIN XL) 300 MG 24 hr tablet TAKE ONE TABLET EACH DAY  30 tablet  6  . celecoxib (CELEBREX) 200 MG capsule Take 1 capsule (200 mg total) by mouth daily.  30 capsule  11  . cyclobenzaprine (FLEXERIL) 10 MG tablet Take 1 tablet (10 mg total) by mouth 3 (three) times daily as needed for muscle spasms.  30 tablet  2  . diazepam (VALIUM) 2 MG tablet Take 0.5 tablets (1 mg total) by mouth as directed. Take 1 mg and up to 4 mg every 6 hrs as needed for neck pain/spasm  60  tablet  2  . diphenoxylate-atropine (LOMOTIL) 2.5-0.025 MG per tablet Take 1 tablet by mouth 4 (four) times daily as needed.      [name] FLUoxetine (PROZAC) 20 MG capsule Take 40 mg by mouth daily.       [name] HYDROcodone-acetaminophen (NORCO/VICODIN) 5-325 MG per tablet       . inFLIXimab (REMICADE) 100 MG injection Inject into the vein. Every 2 months      . lamoTRIgine (LAMICTAL) 200 MG tablet Take 200 mg by mouth daily.        . Lidocaine 2.5 % LIQD Gargle & spit prn sore throat.  15 mL  1  . Mesalamine 800 MG TBEC Take 3 tablets by mouth 2 (two) times daily.      . methylphenidate (CONCERTA) 27 MG CR tablet Take 1 tablet (27 mg total) by mouth every morning.    0  . metoCLOPramide (REGLAN) 5 MG tablet Take 5 mg by mouth every 6 (six) hours as needed.      . pantoprazole (PROTONIX) 40 MG tablet Take 40 mg by mouth daily.      . pirbuterol (MAXAIR) 200 MCG/INH inhaler Inhale 2 puffs into the lungs as needed. As needed for asthma/allergies.      . promethazine (PHENERGAN) 12.5 MG tablet Take by mouth every 6 (six) hours as needed.      [name] SYNTHROID 100 MCG tablet TAKE ONE TABLET EVERY MORNING  30 tablet  5  . traMADol (ULTRAM) 50 MG tablet Take 1-2 tablets (50-100 mg total) by mouth 2 (two) times daily as needed for pain.  100 tablet  1  . zolpidem (AMBIEN CR) 12.5 MG CR tablet Take 1 tablet (12.5 mg total) by mouth at bedtime as needed.  30 tablet  5  . [DISCONTINUED] losartan-hydrochlorothiazide (HYZAAR) 50-12.5 MG per tablet Take 1 tablet by mouth daily.      . [DISCONTINUED] simvastatin (ZOCOR) 20 MG tablet Take 20 mg by mouth every evening.       No current facility-administered medications on file prior to visit.    Allergies:  Allergies  Allergen Reactions  . Ampicillin Nausea And Vomiting  . Erythromycin   . Morphine And Related     Family History: Family History  Problem Relation Age of Onset  . Hypertension Father   . Colon cancer Father 57  . Arthritis Father   . Heart disease  Father     CHF  . Diabetes Neg Hx     Social History: History   Social History  . Marital Status: Married    Spouse Name: N/A  Number of Children: 3  . Years of Education: 16   Occupational History  . interior decorator    Social History Main Topics  . Smoking status: Former Smoker    Quit date: 10/30/1987  . Smokeless tobacco: Never Used  . Alcohol Use: 0.5 oz/week    1 drink(s) per week     Comment: once a week  . Drug Use: No  . Sexual Activity: Not on file   Other Topics Concern  . Not on file   Social History Narrative   [employer]; post-graduate study [employer].  married '68.  3 children: 2 daughters - '70, '72; 1 son '82; 6 grand-children. Lost her mother 2008, elderly father lives alone-in [place] died in 09/09/2022. owner/operator interior Agricultural consultant business. SO-in good health.  Marriage is a work in progress    Review of Systems:  CONSTITUTIONAL: No fevers, chills, night sweats, or weight loss.   EYES: No visual changes or eye pain ENT: No hearing changes.  No history of nose bleeds.   RESPIRATORY: No cough, wheezing and shortness of breath.   CARDIOVASCULAR: Negative for chest pain, and palpitations.   GI: Negative for abdominal discomfort, blood in stools or black stools.  +change in bowel habits.   GU:  No history of incontinence.   MUSCLOSKELETAL: No history of joint pain or swelling.  No myalgias.   SKIN: Negative for lesions, rash, and itching.   HEMATOLOGY/ONCOLOGY: Negative for prolonged bleeding, bruising easily, and swollen nodes.   ENDOCRINE: Negative for cold or heat intolerance, polydipsia or goiter.   PSYCH:  +depression or anxiety symptoms.   NEURO: As Above.   Vital Signs:  BP 140/66  Pulse 76  Resp 20  Ht $R'5\' 6"'yU$  (1.676 m)  Wt 144 lb 7 oz (65.516 kg)  BMI 23.32 kg/m2    Neurological Exam: MENTAL STATUS including orientation to time, place, person, recent and remote memory, attention span and concentration, language, and fund of  knowledge is fairly normal.  Speech is not dysarthric. Serial presidents:  Obama, Bush, ??, Bush F words:  8 Naming intact  CRANIAL NERVES: II:  No visual field defects.  Unremarkable fundi.   III-IV-VI: Pupils equal round and reactive to light.  Normal conjugate, extra-ocular eye movements in all directions of gaze.  No nystagmus.  No ptosis.   V:  Normal facial sensation.   VII:  Normal facial symmetry and movements.   VIII:  Normal hearing and vestibular function.   IX-X:  Normal palatal movement.   XI:  Normal shoulder shrug and head rotation.   XII:  Normal tongue strength and range of motion, no deviation or fasciculation.  MOTOR:  No atrophy, fasciculations or abnormal movements.  No pronator drift.  Tone is normal.    Right Upper Extremity:    Left Upper Extremity:    Deltoid  5/5   Deltoid  5/5   Biceps  5/5   Biceps  5/5   Triceps  5/5   Triceps  5/5   Wrist extensors  5/5   Wrist extensors  5/5   Wrist flexors  5/5   Wrist flexors  5/5   Finger extensors  5/5   Finger extensors  5/5   Finger flexors  5/5   Finger flexors  5/5   Dorsal interossei  5/5   Dorsal interossei  5/5   Abductor pollicis  5/5   Abductor pollicis  5/5   Tone (Ashworth scale)  0  Tone (Ashworth scale)  0   Right Lower Extremity:    Left Lower Extremity:    Hip flexors  5/5   Hip flexors  5/5   Hip extensors  5/5   Hip extensors  5/5   Knee flexors  5/5   Knee flexors  5/5   Knee extensors  5/5   Knee extensors  5/5   Dorsiflexors  5/5   Dorsiflexors  5/5   Plantarflexors  5/5   Plantarflexors  5/5   Toe extensors  5/5   Toe extensors  5/5   Toe flexors  5/5   Toe flexors  5/5   Tone (Ashworth scale)  0  Tone (Ashworth scale)  0   MSRs:  Right                                                                 Left brachioradialis 2+  brachioradialis 2+  biceps 2+  biceps 2+  triceps 2+  triceps 2+  patellar 2+  patellar 2+  ankle jerk 0  ankle jerk 0  Hoffman no  Hoffman no  plantar  response down  plantar response down   SENSORY:  Vibration diminished to 40% at great toe bilaterally.  Normal and symmetric perception of light touch, pinprick, and proprioception.  Romberg's sign present.   COORDINATION/GAIT: Mild ataxia with finger to nose and heel-to-shin testing on left. Intact rapid alternating movements bilaterally.  Able to rise from a chair without using arms.  Unassisted gait is very unstable and ataxic with veering to the right and using the wall for support at times. She is unable to perform tandem gait.   IMPRESSION: 1.  Moderately severe gait ataxia, likely multifactorial due to neuropathy and severe spinal stenosis  - CK normal, no focal weakness.   - Recommended PT for gait training  - Strongly encouraged patient to use gait assist device  - Fall precautions discussed 2.  Distal predominant large fiber neuropathy  - check labs for treatable causes of neuropathy  - EMG to better characterize symptoms  - Vitamin B12, TSH, ESR is normal 3.  Multilevel severe spinal stenosis  - MRI lumbar spine reviewed  - She saw Dr. Vertell Limber in 2012, patient not interested in surgery at this time 4.  Short term memory loss, possible mild cognitive impairment vs age-related memory loss  -No significant volume loss on MRI brain 2012.  There is white matter changes in the paramedian pons, ?etiology since she has no hypertension or diabetes 5.  Ulcerative colitis 6.  Rheumatoid arthritis   PLAN/RECOMMENDATIONS:  1.  Check 2-hr glucose tolerance test, copper, SPEP/UPEP with IFE 2.  Formal neuropsychiatric testing  3.  EMG of the right leg 4.  Physical therapy for gait training and strongly encouraged gait assistance 5.  Return to clinic in 6-weeks or sooner   The duration of this appointment visit was 60 minutes of face-to-face time with the patient.  Greater than 50% of this time was spent in counseling, explanation of diagnosis, planning of further management, and  coordination of care.   Thank you for allowing me to participate in patient's care.  If I can answer any additional questions, I would be pleased to do so.    Sincerely,    Diago Haik K. Posey Pronto,  DO

## 2013-06-27 LAB — COPPER, SERUM: Copper: 125 ug/dL (ref 70–175)

## 2013-06-28 NOTE — Addendum Note (Signed)
Addended by: Alda Berthold on: 06/28/2013 08:59 AM   Modules accepted: Level of Service

## 2013-06-29 LAB — UIFE/LIGHT CHAINS/TP QN, 24-HR UR
ALBUMIN, U: DETECTED
ALPHA 1 UR: DETECTED — AB
ALPHA 2 UR: DETECTED — AB
BETA UR: DETECTED — AB
Free Kappa Lt Chains,Ur: 1.28 mg/dL (ref 0.14–2.42)
Free Kappa/Lambda Ratio: 2.61 ratio (ref 2.04–10.37)
Free Lambda Lt Chains,Ur: 0.49 mg/dL (ref 0.02–0.67)
Gamma Globulin, Urine: DETECTED — AB
Total Protein, Urine: 2.4 mg/dL

## 2013-06-29 LAB — PROTEIN ELECTROPHORESIS, SERUM
ALBUMIN ELP: 64.1 % (ref 55.8–66.1)
ALPHA-1-GLOBULIN: 6.2 % — AB (ref 2.9–4.9)
ALPHA-2-GLOBULIN: 12.8 % — AB (ref 7.1–11.8)
BETA GLOBULIN: 6.9 % (ref 4.7–7.2)
Beta 2: 3.1 % — ABNORMAL LOW (ref 3.2–6.5)
Gamma Globulin: 6.9 % — ABNORMAL LOW (ref 11.1–18.8)
TOTAL PROTEIN, SERUM ELECTROPHOR: 6.5 g/dL (ref 6.0–8.3)

## 2013-07-01 ENCOUNTER — Other Ambulatory Visit: Payer: Medicare Other

## 2013-07-01 DIAGNOSIS — R413 Other amnesia: Secondary | ICD-10-CM

## 2013-07-01 DIAGNOSIS — W19XXXA Unspecified fall, initial encounter: Secondary | ICD-10-CM

## 2013-07-01 DIAGNOSIS — G609 Hereditary and idiopathic neuropathy, unspecified: Secondary | ICD-10-CM

## 2013-07-01 LAB — GLUCOSE TOLERANCE, 2 HOURS
GLUCOSE 1 HOUR GTT: 130 mg/dL
GLUCOSE, 2 HOUR: 91 mg/dL
GLUCOSE, FASTING: 103 mg/dL — AB (ref 70–99)

## 2013-07-01 NOTE — Addendum Note (Signed)
Addended by: Ella Jubilee on: 07/01/2013 08:49 AM   Modules accepted: Orders

## 2013-07-05 ENCOUNTER — Ambulatory Visit (INDEPENDENT_AMBULATORY_CARE_PROVIDER_SITE_OTHER): Payer: Medicare Other | Admitting: Neurology

## 2013-07-05 ENCOUNTER — Other Ambulatory Visit: Payer: Self-pay | Admitting: Internal Medicine

## 2013-07-05 ENCOUNTER — Encounter: Payer: Self-pay | Admitting: Neurology

## 2013-07-05 DIAGNOSIS — G609 Hereditary and idiopathic neuropathy, unspecified: Secondary | ICD-10-CM | POA: Diagnosis not present

## 2013-07-05 NOTE — Progress Notes (Signed)
See procedure note for EMG results.  [name] K. Christabelle Hanzlik, DO  

## 2013-07-05 NOTE — Procedures (Signed)
[facility]  [address], [address]  [place], [place] [address] Tel: [phone] Fax:  [phone] Test Date:  07/05/2013  Patient: [name] DOB: 1946-06-06 Physician: Narda Amber, DO  Sex: Female Height: 5' 6"  Ref Phys: Narda Amber  ID#: [id number] Temp: 32.4 Technician:    Patient Complaints: This is a 68 year-old female presenting with leg weakness, falls, and gait instability.  NCV & EMG Findings: Extensive evaluation of the right lower extremity and additional studies of the left reveals: 1. Absent sural and superficial peroneal sensory responses bilaterally. 2. The peroneal motor response is reduced at the extensor digitorum brevis and normal at the tibialis anterior bilaterally. The tibial motor responses are also reduced. 3. H-reflex is absent bilaterally. 4. Needle electrode examination shows chronic motor axon loss changes the tibialis anterior, medial gastrocnemius, and posterior tibialis muscles bilaterally.  Impression: There is electrophysiological evidence of a chronic sensoriomotor length-dependent, axonal polyneuropathy affecting bilateral lower extremities; mild-moderate in degree electrically.  ___________________________ Narda Amber, DO    Nerve Conduction Studies Anti Sensory Summary Table   Site NR Peak (ms) Norm Peak (ms) P-T Amp (V) Norm P-T Amp  Left Sup Peroneal Anti Sensory (Ant Lat Mall)  12 cm NR  <4.6  >3  Right Sup Peroneal Anti Sensory (Ant Lat Mall)  12 cm NR  <4.6  >3  Left Sural Anti Sensory (Lat Mall)  Calf NR  <4.6  >3  Right Sural Anti Sensory (Lat Mall)  Calf NR  <4.6  >3   Motor Summary Table   Site NR Onset (ms) Norm Onset (ms) O-P Amp (mV) Norm O-P Amp Site1 Site2 Delta-0 (ms) Dist (cm) Vel (m/s) Norm Vel (m/s)  Left Peroneal Motor (Ext Dig Brev)  Ankle    4.3 <6.0 0.7 >2.5 B Fib Ankle 9.5 34.0 36 >40  B Fib    13.8  0.5  Poplt B Fib  0.0  >40  Poplt NR            Right Peroneal Motor (Ext Dig Brev)    Ankle    4.4 <6.0 1.6 >2.5 B Fib Ankle 10.4 39.0 38 >40  B Fib    14.8  1.5  Poplt B Fib 1.5 10.0 67 >40  Poplt    16.3  1.8         Post-exercise    4.4  1.6         Left Peroneal TA Motor (Tib Ant)  Fib Head    4.5 <4.5 3.6 >3 Poplit Fib Head 1.7 10.0 59 >40  Poplit    6.2  3.6         Right Peroneal TA Motor (Tib Ant)  Fib Head    4.2 <4.5 5.3 >3 Poplit Fib Head 1.7 10.0 59 >40  Poplit    5.9  5.3         Left Tibial Motor (Abd Hall Brev)  Ankle    4.1 <6.0 1.4 >4 Knee Ankle 11.2 41.0 37 >40  Knee    15.3  1.1         Right Tibial Motor (Abd Hall Brev)  Ankle    5.9 <6.0 2.9 >4 Knee Ankle 11.8 41.0 35 >40  Knee    17.7  2.7         Post-exercise    5.5  2.7          H Reflex Studies   NR H-Lat (ms) Lat Norm (ms) L-R H-Lat (ms)  Left Tibial (Gastroc)  NR  <35   Right Tibial (Gastroc)  NR  <35    EMG   Side Muscle Ins Act Fibs Psw Fasc Number Recrt Dur Dur. Amp Amp. Poly Poly. Comment  Right AntTibialis Nml Nml Nml Nml 1- Mod Few 1+ Nml Nml Nml Nml N/A  Right Gastroc Nml Nml Nml Nml 1- Mod Few 1+ Nml Nml Nml Nml N/A  Right PostTibialis Nml Nml Nml Nml 1- Mod Few 1+ Nml Nml Nml Nml N/A  Right RectFemoris Nml Nml Nml Nml Nml Nml Nml Nml Nml Nml Nml Nml N/A  Right VastusLat Nml Nml Nml Nml Nml Nml Nml Nml Nml Nml Nml Nml N/A  Right BicepsFemS Nml Nml Nml Nml Nml Nml Nml Nml Nml Nml Nml Nml N/A  Right GluteusMed Nml Nml Nml Nml Nml Nml Nml Nml Nml Nml Nml Nml N/A  Left PostTibialis Nml Nml Nml Nml 1- Mod Few 1+ Nml Nml Nml Nml N/A  Left Gastroc Nml Nml Nml Nml 1- Mod Few 1+ Nml Nml Nml Nml N/A  Left VastusLat Nml Nml Nml Nml Nml Nml Nml Nml Nml Nml Nml Nml N/A  Left BicepsFemS Nml Nml Nml Nml Nml Nml Nml Nml Nml Nml Nml Nml N/A  Left AntTibialis Nml Nml Nml Nml 1- Mod Few 1+ Nml Nml Nml Nml N/A      Waveforms:

## 2013-07-07 ENCOUNTER — Ambulatory Visit (INDEPENDENT_AMBULATORY_CARE_PROVIDER_SITE_OTHER): Payer: Medicare Other

## 2013-07-07 ENCOUNTER — Telehealth: Payer: Self-pay | Admitting: Internal Medicine

## 2013-07-07 ENCOUNTER — Telehealth: Payer: Self-pay

## 2013-07-07 DIAGNOSIS — J020 Streptococcal pharyngitis: Secondary | ICD-10-CM | POA: Diagnosis not present

## 2013-07-07 LAB — POCT RAPID STREP A (OFFICE): Rapid Strep A Screen: NEGATIVE

## 2013-07-07 NOTE — Telephone Encounter (Signed)
The patient called and is hoping to get advice on what to do for her strep (spelling?) throat.  She states she was told that she could come in for a nurse visit to be tested.  However, usually that is an ov with the dr.  She was offered the next available apt, but is insisting to come in this afternoon or tomorrow morning.    Please call the pt back and discuss her symptoms.  She is confused on what to do next.    Callback - [phone]

## 2013-07-07 NOTE — Telephone Encounter (Signed)
(  see previous phone note) she is to be scheduled for nurse visit for strep test per Dr Linda Hedges

## 2013-07-07 NOTE — Telephone Encounter (Signed)
07/07/2013  Pt is having a sore throat again and is recently recovering from strep throat (tookamoxicillin (AMOXIL) 875 MG 06/22/2013) and is wanting to know if she can get an antibiotic or just have a strep test done instead of scheduling an appt.  Please contact pt to advise.

## 2013-07-07 NOTE — Telephone Encounter (Signed)
Ok to come by for a repeat strep test - nurse visit

## 2013-07-07 NOTE — Telephone Encounter (Signed)
Patient transferred to scheduling desk for nurse visit appt for strep test

## 2013-07-08 ENCOUNTER — Ambulatory Visit
Admission: RE | Admit: 2013-07-08 | Discharge: 2013-07-08 | Disposition: A | Discharge status: Home / Self Care | Payer: Medicare Other | Source: Ambulatory Visit | Attending: Internal Medicine | Admitting: Internal Medicine

## 2013-07-08 ENCOUNTER — Telehealth: Payer: Self-pay | Admitting: Neurology

## 2013-07-08 DIAGNOSIS — Z1239 Encounter for other screening for malignant neoplasm of breast: Secondary | ICD-10-CM

## 2013-07-08 DIAGNOSIS — Z1231 Encounter for screening mammogram for malignant neoplasm of breast: Secondary | ICD-10-CM | POA: Diagnosis not present

## 2013-07-08 NOTE — Telephone Encounter (Signed)
Patient called to get phone number and name of Neuro rehab physician she was referred to. I let her know it is Dr Valentina Shaggy and gave her the contact number and address. Made aware they will usually call her to set up an appt but if she doesn't hear from them, I let her know she should call. She will call us with any questions.

## 2013-07-09 ENCOUNTER — Other Ambulatory Visit: Payer: Self-pay | Admitting: Internal Medicine

## 2013-07-12 ENCOUNTER — Telehealth: Payer: Self-pay | Admitting: Neurology

## 2013-07-12 ENCOUNTER — Encounter: Payer: Self-pay | Admitting: Neurology

## 2013-07-12 ENCOUNTER — Ambulatory Visit (INDEPENDENT_AMBULATORY_CARE_PROVIDER_SITE_OTHER): Payer: Medicare Other | Admitting: Neurology

## 2013-07-12 VITALS — BP 138/72 | HR 68 | Resp 18 | Ht 65.5 in | Wt 147.0 lb

## 2013-07-12 DIAGNOSIS — K529 Noninfective gastroenteritis and colitis, unspecified: Secondary | ICD-10-CM

## 2013-07-12 DIAGNOSIS — R269 Unspecified abnormalities of gait and mobility: Secondary | ICD-10-CM

## 2013-07-12 DIAGNOSIS — K5289 Other specified noninfective gastroenteritis and colitis: Secondary | ICD-10-CM

## 2013-07-12 DIAGNOSIS — G63 Polyneuropathy in diseases classified elsewhere: Secondary | ICD-10-CM | POA: Diagnosis not present

## 2013-07-12 DIAGNOSIS — K6389 Other specified diseases of intestine: Principal | ICD-10-CM

## 2013-07-12 NOTE — Telephone Encounter (Signed)
Referral to [facility] faxed to [phone] with confirmation received.

## 2013-07-12 NOTE — Progress Notes (Signed)
Follow-up Visit   Date: 07/12/2013    [name] MRN: [id number] DOB: 10/20/1945   Interim History: [name] is a 68 y.o. right-handed Caucasian female with history of hypothyroidism, GERD, ulcerative colitis (diagnosed 2012, currently on remicade), rheumatoid arthritis, spinal stenosis s/p L5-S1 lamintectomy, asthma, depression, and depression returning to the clinic for follow-up of gait abnormality.  The patient was accompanied to the clinic by self.  She was last seen in the office on 06/25/2013.  Since then, there has been no interval change in her symptoms.  No recent falls.  She completed neuropathy labs and EMG which showed mild-moderate distal and symmetric peripheral neuropathy.  She reports that her UC and RA seems to be well controlled on remicade and gait instability preceded starting remicade.  History of present illness: Around 2012, she started noticing that she would fall easily and her legs felt weak. She was seen here by Dr. Jacelyn Grip in October 2012 for the same complaints and sciatica and was being managed for left lumbar spine radiculopathy and possible peripheral neuropathy given history of ulcerative colitis. She underwent EMG in 04/2011 by [facility] which was consistent with left S1 radiculopathy. Imaging of her lumbar spine showed multilevel spondylosis and degenerative disc disease with impingement at all levels between L2 and S1. She was referred to Dr. Vertell Limber, neurosurgeon, who recommended surgery, but patient would like surgery only as a last resort. Conservative management with steroid epidural injections and physical therapy (2013) was pursued which alleviated pain, but did not improve her leg weakness and falls.   Since then, her falls and gait unsteadiness has worsened. She falls about 4-5 times per month and always falls forwards. She does not have any prodromal symptoms.  Leaning forward, backwards, or to the side too far  will cause her to fall. She is able to get up herself after falling without difficulty. She continues to have mild low back pain. She had sustained significant injuries from her falls, including lip laceration last year and fractured left scapular (2014), and broken her toe.   She also complains of short-term memory loss which has been ongoing for the past 3 years. She is forgetting telephone numbers, cannot finish a sentence, or forgets tasks. Denies getting lost or any recent car accidents.     Medications:  Current Outpatient Prescriptions on File Prior to Visit  Medication Sig Dispense Refill  . ALPRAZolam (XANAX) 0.5 MG tablet Take 0.5 mg by mouth as needed for sleep.      [name] buPROPion (WELLBUTRIN XL) 300 MG 24 hr tablet TAKE ONE TABLET EACH DAY  30 tablet  6  . celecoxib (CELEBREX) 200 MG capsule Take 1 capsule (200 mg total) by mouth daily.  30 capsule  11  . diazepam (VALIUM) 2 MG tablet Take 0.5 tablets (1 mg total) by mouth as directed. Take 1 mg and up to 4 mg every 6 hrs as needed for neck pain/spasm  60 tablet  2  . FLUoxetine (PROZAC) 20 MG capsule Take 40 mg by mouth daily.       [name] inFLIXimab (REMICADE) 100 MG injection Inject into the vein. Every 2 months      . lamoTRIgine (LAMICTAL) 200 MG tablet Take 200 mg by mouth daily.        . Mesalamine (ASACOL HD) 800 MG TBEC Take 3 tablets twice daily  180 each  1  . methylphenidate (CONCERTA) 27 MG CR tablet Take 1 tablet (27 mg  total) by mouth every morning.    0  . metoCLOPramide (REGLAN) 5 MG tablet Take 5 mg by mouth every 6 (six) hours as needed.      . pantoprazole (PROTONIX) 40 MG tablet Take 40 mg by mouth daily.      . pirbuterol (MAXAIR) 200 MCG/INH inhaler Inhale 2 puffs into the lungs as needed. As needed for asthma/allergies.      . promethazine (PHENERGAN) 12.5 MG tablet Take by mouth every 6 (six) hours as needed.      [name] SYNTHROID 100 MCG tablet TAKE ONE TABLET EVERY MORNING  30 tablet  5  . zolpidem (AMBIEN) 10 MG  tablet Take 10 mg by mouth at bedtime as needed for sleep.      . [DISCONTINUED] losartan-hydrochlorothiazide (HYZAAR) 50-12.5 MG per tablet Take 1 tablet by mouth daily.      . [DISCONTINUED] simvastatin (ZOCOR) 20 MG tablet Take 20 mg by mouth every evening.       No current facility-administered medications on file prior to visit.    Allergies:  Allergies  Allergen Reactions  . Ampicillin Nausea And Vomiting  . Erythromycin   . Morphine And Related      Review of Systems:  CONSTITUTIONAL: No fevers, chills, night sweats, + 50lb weight loss.   EYES: No visual changes or eye pain ENT: No hearing changes.  No history of nose bleeds.   RESPIRATORY: No cough, wheezing and shortness of breath.   CARDIOVASCULAR: Negative for chest pain, and palpitations.   GI: Negative for abdominal discomfort, blood in stools or black stools.  No recent change in bowel habits.   GU:  No history of incontinence.   MUSCLOSKELETAL: No history of joint pain or swelling.  No myalgias.   SKIN: Negative for lesions, rash, and itching.   ENDOCRINE: Negative for cold or heat intolerance, polydipsia or goiter.   PSYCH:  No depression or anxiety symptoms.   NEURO: As Above.   Vital Signs:  BP 138/72  Pulse 68  Resp 18  Ht 5' 5.5" (1.664 m)  Wt 147 lb (66.679 kg)  BMI 24.08 kg/m2  Neurological Exam: MENTAL STATUS including orientation to time, place, person, recent and remote memory, attention span and concentration, language, and fund of knowledge is normal.  Speech is not dysarthric.  CRANIAL NERVES: Pupils equal round and reactive to light.  Normal conjugate, extra-ocular eye movements in all directions of gaze.  No ptosis. Face is symmetric. Palate elevates symmetrically.  Tongue is midline.  MOTOR:  Motor strength is 5/5 in all extremities.  No atrophy, fasciculations or abnormal movements.  No pronator drift.  Tone is normal.    MSRs:  Reflexes are 2+/4 throughout, except absent Achilles  bilaterally.  SENSORY:  Vibration diminished to 40% at great toe bilaterally.  COORDINATION/GAIT:  Intact rapid alternating movements bilaterally.  Gait is tested unassisted and is scissoring at times and ataxic.    Data: MRI lumbar spine 04/19/2011:  Findings: Dextroconvex lumbar scoliosis noted.  As on the prior exam, the lowest full intervertebral disk space is labeled L5-S1. If procedural intervention is to be performed, careful correlation with this numbering strategy is recommended.  The conus medullaris appears unremarkable. Conus level: L1-2.  Loss of intervertebral disc height that the L2-3, L4-5, and L5-S1 levels. Degenerative endplate findings are more striking at the L2-3 level.  There is 2 mm of anterior subluxation of L2 on L3.  Multiple T2 hyperintense left renal lesions are likely cysts are only  partially characterized. There is a significant rotary component of the dextroconvex lumbar scoliosis.  Additional findings at individual levels are as follows:  L1-2: Diffuse disc bulge and facet arthropathy noted. No impingement.  L2-3: Diffuse disc bulge noted along with a right inferior foraminal disc protrusion as well as a suspected left foraminal disc protrusion The AP diameter of the thecal sac is narrowed to 7 mm, compatible with moderate central stenosis. There is moderate left foraminal stenosis and left lateral extraforaminal disc material abuts the left L2 nerve in the lateral extraforaminal space. There is moderate bilateral subarticular lateral recess stenosis.  L3-4: Disc bulge noted with right foraminal disc protrusion. Degenerative facet encroachment noted, right greater left. There is mild to moderate central stenosis along with mild right foraminal stenosis. There is moderate right and borderline left subarticular lateral recess stenosis.  L4-5: Right greater than left facet arthropathy noted with diffuse disc osteophyte complex. In conjunction with the facet spurring  there is moderate right foraminal stenosis as well as mild right subarticular lateral recess stenosis. The facet encroachment causes mild central stenosis.  L5-S1: Prior right laminectomy noted. Facet arthropathy and intervertebral spurring cause moderate right and mild left foraminal stenosis.  IMPRESSION:  1. Lumbar spondylosis and degenerative disc disease with impingement at all levels between L2 and S1.   MRI/A brain 03/25/2013:  1. No evidence of acute ischemia.  2. Nonspecific subcortical white matter changes supratentorially and in the paramedian pons. These probably representsequelae of chronic microvascular ischemia which may relate to hypertension and/or diabetes, with less likely possibility of a vasculitis or demyelinating process. Clinical correlation suggested.  3. Probable sebaceous cyst in the left occipital subcutaneous region.  4. No occlusions, stenosis, dissections or aneurysms seen on the study.  EMG of the legs 07/05/2013: NCV & EMG Findings:  Extensive evaluation of the right lower extremity and additional studies of the left reveals:  1. Absent sural and superficial peroneal sensory responses bilaterally. 2. The peroneal motor response is reduced at the extensor digitorum brevis and normal at the tibialis anterior bilaterally. The tibial motor responses are also reduced. 3. H-reflex is absent bilaterally. 4. Needle electrode examination shows chronic motor axon loss changes the tibialis anterior, medial gastrocnemius, and posterior tibialis muscles bilaterally. Impression:  There is electrophysiological evidence of a chronic sensoriomotor length-dependent, axonal polyneuropathy affecting bilateral lower extremities; mild-moderate in degree electrically.  Component     Latest Ref Rng 11/05/2012 06/25/2013 07/01/2013  Glucose, Fasting     70 - 99 mg/dL   103 (H)  Glucose, GTT - 1 Hour        130  Glucose, 2 hour        91  Vitamin B-12     211 - 911 pg/mL 578    Sed  Rate     0 - 22 mm/hr 10    CK Total     7 - 177 U/L 151    Copper     70 - 175 mcg/dL  125   SPEP/UPEP with IFE: no M protein   IMPRESSION/PLAN: 1.  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  - No evidence of demyelinating neuropathy on EMG, so doubt this is a medication effect from remicade  - Neuropathy labs are normal (2hr GTT, B12, ESR, copper, TSH, SPEP/UPEP with IFE)  - Due to colitis and possible malabsorption,  will also check vitamin E, B1, zinc, and MMA  - Interestingly, she has a scissoring gait which is usually seen in adductor spasticity, but there I cannot appreciate increased tone on exam  - I would like to refer her to PT for gait training.  I have also encouraged her to use a cane to prevent falls  - Literature on fall precautions provided and was discussed 2.  Multilevel spinal stenosis  - Previously seen Dr. Vertell Limber, patient not interested in surgery at this time  - Likely also contributing to gait problems 3.  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 Ulcerative colitis 5. Rheumatoid arthritis 6. Return to clinic in 86-month   The duration of this appointment visit was 30 minutes of face-to-face time with the patient.  Greater than 50% of this time was spent in counseling, explanation of diagnosis, planning of further management, and coordination of care.   Thank you for allowing me to participate in patient's care.  If I can answer any additional questions, I would be pleased to do so.    Sincerely,    Donika K. PPosey Pronto DO

## 2013-07-12 NOTE — Patient Instructions (Addendum)
Check vitamin E, B1, zinc, MMA - We will send you to [facility] today on the first floor of our building.  Start physical therapy for gait training and leg strengthening - we will send referral to [facility]. Use gait assistive device for stability. Return to clinic in 62-month.

## 2013-07-14 ENCOUNTER — Encounter: Payer: Self-pay | Admitting: Internal Medicine

## 2013-07-14 ENCOUNTER — Ambulatory Visit (INDEPENDENT_AMBULATORY_CARE_PROVIDER_SITE_OTHER): Payer: Medicare Other | Admitting: Internal Medicine

## 2013-07-14 VITALS — BP 132/82 | HR 77 | Temp 97.9°F | Wt 146.4 lb

## 2013-07-14 DIAGNOSIS — J029 Acute pharyngitis, unspecified: Secondary | ICD-10-CM | POA: Diagnosis not present

## 2013-07-14 LAB — METHYLMALONIC ACID, SERUM: METHYLMALONIC ACID, QUANT: 0.12 umol/L (ref <0.40)

## 2013-07-14 MED ORDER — AMOXICILLIN 875 MG PO TABS: 875.0000 mg | ORAL_TABLET | Freq: Two times a day (BID) | ORAL | Status: DC

## 2013-07-14 NOTE — Progress Notes (Signed)
Subjective:    Patient ID: [name], female    DOB: April 14, 1946, 68 y.o.   MRN: [id number]  HPI [name] was seen Dec 30th for strep throat with a positive strep test. She was treated with amoxicillin and viscous xylocaine. She had a repeat strep screen Jan 14th - negative. She presents now recurrent pharyngitis: sore throat, painful swallow. Of note she does take Remicade treatments for RA and is coming to the end of cycle with next treatment coming due.  PMH, FamHx and SocHx reviewed for any changes and relevance. Current Outpatient Prescriptions on File Prior to Visit  Medication Sig Dispense Refill  . ALPRAZolam (XANAX) 0.5 MG tablet Take 0.5 mg by mouth as needed for sleep.      [name] buPROPion (WELLBUTRIN XL) 300 MG 24 hr tablet TAKE ONE TABLET EACH DAY  30 tablet  6  . celecoxib (CELEBREX) 200 MG capsule Take 1 capsule (200 mg total) by mouth daily.  30 capsule  11  . diazepam (VALIUM) 2 MG tablet Take 0.5 tablets (1 mg total) by mouth as directed. Take 1 mg and up to 4 mg every 6 hrs as needed for neck pain/spasm  60 tablet  2  . FLUoxetine (PROZAC) 20 MG capsule Take 40 mg by mouth daily.       [name] inFLIXimab (REMICADE) 100 MG injection Inject into the vein. Every 2 months      . lamoTRIgine (LAMICTAL) 200 MG tablet Take 200 mg by mouth daily.        . Mesalamine (ASACOL HD) 800 MG TBEC Take 3 tablets twice daily  180 each  1  . methylphenidate (CONCERTA) 27 MG CR tablet Take 1 tablet (27 mg total) by mouth every morning.    0  . metoCLOPramide (REGLAN) 5 MG tablet Take 5 mg by mouth every 6 (six) hours as needed.      . pantoprazole (PROTONIX) 40 MG tablet Take 40 mg by mouth daily.      . pirbuterol (MAXAIR) 200 MCG/INH inhaler Inhale 2 puffs into the lungs as needed. As needed for asthma/allergies.      . promethazine (PHENERGAN) 12.5 MG tablet Take by mouth every 6 (six) hours as needed.      [name] SYNTHROID 100 MCG tablet TAKE ONE TABLET EVERY MORNING  30 tablet  5  .  zolpidem (AMBIEN) 10 MG tablet Take 10 mg by mouth at bedtime as needed for sleep.      . [DISCONTINUED] losartan-hydrochlorothiazide (HYZAAR) 50-12.5 MG per tablet Take 1 tablet by mouth daily.      . [DISCONTINUED] simvastatin (ZOCOR) 20 MG tablet Take 20 mg by mouth every evening.       No current facility-administered medications on file prior to visit.      Review of Systems System review is negative for any constitutional, cardiac, pulmonary, GI or neuro symptoms or complaints other than as described in the HPI.     Objective:   Physical Exam Filed Vitals:   07/14/13 0836  BP: 132/82  Pulse: 77  Temp: 97.9 F (36.6 C)   Wt Readings from Last 3 Encounters:  07/14/13 146 lb 6.4 oz (66.407 kg)  07/12/13 147 lb (66.679 kg)  06/25/13 144 lb 7 oz (65.516 kg)   Gen'l - WNWD woman in no acute distress.  HEENT - posterior pharynx is very raw and red with raised papules, but no exudate Nodes - negative cervical region Cor - RRR Pulm - normal respirations.  Assessment & Plan:   Strep Pharyngitis - appears to be a recurrence. Checked up to date - no pencillin resistance documented/.  Plan Amoxicillin 875 mg twice a day for 10 days.  Use lidocaine gargle as needed

## 2013-07-14 NOTE — Progress Notes (Signed)
Pre visit review using our clinic review tool, if applicable. No additional management support is needed unless otherwise documented below in the visit note. 

## 2013-07-14 NOTE — Patient Instructions (Signed)
Strep Pharyngitis - appears to be a recurrence. Checked up to date - no pencillin resistance documented/.  Plan Amoxicillin 875 mg twice a day for 10 days.  Use lidocaine gargle as needed   Strep Throat Strep throat is an infection of the throat caused by a bacteria named Streptococcus pyogenes. Your caregiver may call the infection streptococcal "tonsillitis" or "pharyngitis" depending on whether there are signs of inflammation in the tonsils or back of the throat. Strep throat is most common in children aged 5 15 years during the cold months of the year, but it can occur in people of any age during any season. This infection is spread from person to person (contagious) through coughing, sneezing, or other close contact. SYMPTOMS   Fever or chills.  Painful, swollen, red tonsils or throat.  Pain or difficulty when swallowing.  White or yellow spots on the tonsils or throat.  Swollen, tender lymph nodes or "glands" of the neck or under the jaw.  Red rash all over the body (rare). DIAGNOSIS  Many different infections can cause the same symptoms. A test must be done to confirm the diagnosis so the right treatment can be given. A "rapid strep test" can help your caregiver make the diagnosis in a few minutes. If this test is not available, a light swab of the infected area can be used for a throat culture test. If a throat culture test is done, results are usually available in a day or two. TREATMENT  Strep throat is treated with antibiotic medicine. HOME CARE INSTRUCTIONS   Gargle with 1 tsp of salt in 1 cup of warm water, 3 4 times per day or as needed for comfort.  Family members who also have a sore throat or fever should be tested for strep throat and treated with antibiotics if they have the strep infection.  Make sure everyone in your household washes their hands well.  Do not share food, drinking cups, or personal items that could cause the infection to spread to others.  You  may need to eat a soft food diet until your sore throat gets better.  Drink enough water and fluids to keep your urine clear or pale yellow. This will help prevent dehydration.  Get plenty of rest.  Stay home from school, daycare, or work until you have been on antibiotics for 24 hours.  Only take over-the-counter or prescription medicines for pain, discomfort, or fever as directed by your caregiver.  If antibiotics are prescribed, take them as directed. Finish them even if you start to feel better. SEEK MEDICAL CARE IF:   The glands in your neck continue to enlarge.  You develop a rash, cough, or earache.  You cough up green, yellow-brown, or bloody sputum.  You have pain or discomfort not controlled by medicines.  Your problems seem to be getting worse rather than better. SEEK IMMEDIATE MEDICAL CARE IF:   You develop any new symptoms such as vomiting, severe headache, stiff or painful neck, chest pain, shortness of breath, or trouble swallowing.  You develop severe throat pain, drooling, or changes in your voice.  You develop swelling of the neck, or the skin on the neck becomes red and tender.  You have a fever.  You develop signs of dehydration, such as fatigue, dry mouth, and decreased urination.  You become increasingly sleepy, or you cannot wake up completely. Document Released: 06/07/2000 Document Revised: 05/27/2012 Document Reviewed: 08/09/2010 [facility] Patient Information 2014 [place], [place].

## 2013-07-14 NOTE — Progress Notes (Signed)
Pt presented  for a nurse visit to have a POC Strep test performed

## 2013-07-15 LAB — VITAMIN E
Gamma-Tocopherol (Vit E): 1.6 mg/L (ref <=4.3)
VITAMIN E (ALPHA TOCOPHEROL): 17.5 mg/L (ref 5.7–19.9)

## 2013-07-15 LAB — ZINC: ZINC: 73 ug/dL (ref 60–130)

## 2013-07-16 LAB — VITAMIN B1: Vitamin B1 (Thiamine): 10 nmol/L (ref 8–30)

## 2013-07-20 ENCOUNTER — Other Ambulatory Visit: Payer: Self-pay | Admitting: Internal Medicine

## 2013-07-22 ENCOUNTER — Encounter: Payer: Medicare Other | Admitting: Neurology

## 2013-07-28 ENCOUNTER — Telehealth: Payer: Self-pay | Admitting: Neurology

## 2013-07-28 NOTE — Telephone Encounter (Signed)
Beth w/ PT at [facility] called for [facility]. They had recv'd and order for the patient. They are not certain that they are best suited to provide gait training. Please call Beth [phone] / Sherri S.

## 2013-07-28 NOTE — Telephone Encounter (Signed)
Spoke with Beth at [facility], made her aware patient wanted to go to them specifically because she had PT from their office in the past. She will contact patient.

## 2013-07-29 DIAGNOSIS — K5100 Ulcerative (chronic) pancolitis without complications: Secondary | ICD-10-CM | POA: Diagnosis not present

## 2013-07-30 ENCOUNTER — Ambulatory Visit: Payer: Medicare Other | Admitting: Neurology

## 2013-09-14 ENCOUNTER — Telehealth: Payer: Self-pay | Admitting: *Deleted

## 2013-09-14 ENCOUNTER — Telehealth: Payer: Self-pay | Admitting: Neurology

## 2013-09-14 DIAGNOSIS — F411 Generalized anxiety disorder: Secondary | ICD-10-CM

## 2013-09-14 DIAGNOSIS — F988 Other specified behavioral and emotional disorders with onset usually occurring in childhood and adolescence: Secondary | ICD-10-CM

## 2013-09-14 DIAGNOSIS — F3289 Other specified depressive episodes: Secondary | ICD-10-CM

## 2013-09-14 DIAGNOSIS — F329 Major depressive disorder, single episode, unspecified: Secondary | ICD-10-CM

## 2013-09-14 NOTE — Telephone Encounter (Signed)
Spoke with pt and she wanted a new psychiatrist.  Informed her that we don't really have one that we refer to but just refer patients to a couple of different offices.  Gave her the names of [facility].

## 2013-09-14 NOTE — Telephone Encounter (Signed)
Referral sent-pt notified

## 2013-09-14 NOTE — Telephone Encounter (Signed)
Pt called requesting to speak to a nurse...she stated that her different providers she is seeing now Are leaving and is wanting Dr. Posey Pronto to refer/guide her to new providers.

## 2013-09-14 NOTE — Telephone Encounter (Signed)
Referral to Dr. Toy Care - p[facility] notified

## 2013-09-14 NOTE — Telephone Encounter (Signed)
Pt called requesting a referral to a Psychiatrist, she is not wanting to go to Dr Cheryln Manly.  Please advise

## 2013-10-05 ENCOUNTER — Other Ambulatory Visit: Payer: Self-pay | Admitting: *Deleted

## 2013-10-05 MED ORDER — FLUOXETINE HCL 20 MG PO CAPS: 40.0000 mg | ORAL_CAPSULE | Freq: Every day | ORAL | Status: DC

## 2013-10-08 ENCOUNTER — Encounter: Payer: Self-pay | Admitting: Internal Medicine

## 2013-10-12 ENCOUNTER — Encounter: Payer: Self-pay | Admitting: Internal Medicine

## 2013-10-14 DIAGNOSIS — K5100 Ulcerative (chronic) pancolitis without complications: Secondary | ICD-10-CM | POA: Diagnosis not present

## 2013-10-16 DIAGNOSIS — F064 Anxiety disorder due to known physiological condition: Secondary | ICD-10-CM | POA: Diagnosis not present

## 2013-10-18 ENCOUNTER — Ambulatory Visit: Payer: Medicare Other | Admitting: Neurology

## 2013-10-19 DIAGNOSIS — R413 Other amnesia: Secondary | ICD-10-CM | POA: Diagnosis not present

## 2013-10-26 ENCOUNTER — Ambulatory Visit: Payer: Medicare Other | Admitting: Neurology

## 2013-10-26 ENCOUNTER — Telehealth: Payer: Self-pay | Admitting: Neurology

## 2013-10-26 NOTE — Telephone Encounter (Signed)
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  Jamariyah Johannsen K. Posey Pronto, DO

## 2013-10-29 ENCOUNTER — Encounter: Payer: Self-pay | Admitting: Neurology

## 2013-10-29 ENCOUNTER — Ambulatory Visit (INDEPENDENT_AMBULATORY_CARE_PROVIDER_SITE_OTHER): Payer: Medicare Other | Admitting: Neurology

## 2013-10-29 VITALS — BP 164/80 | HR 74 | Ht 64.96 in | Wt 151.0 lb

## 2013-10-29 DIAGNOSIS — M48061 Spinal stenosis, lumbar region without neurogenic claudication: Secondary | ICD-10-CM

## 2013-10-29 DIAGNOSIS — G63 Polyneuropathy in diseases classified elsewhere: Secondary | ICD-10-CM | POA: Diagnosis not present

## 2013-10-29 DIAGNOSIS — K5289 Other specified noninfective gastroenteritis and colitis: Secondary | ICD-10-CM | POA: Diagnosis not present

## 2013-10-29 DIAGNOSIS — M4807 Spinal stenosis, lumbosacral region: Secondary | ICD-10-CM

## 2013-10-29 DIAGNOSIS — K6389 Other specified diseases of intestine: Principal | ICD-10-CM

## 2013-10-29 MED ORDER — CYCLOBENZAPRINE HCL 5 MG PO TABS: 5.0000 mg | ORAL_TABLET | Freq: Every day | ORAL | Status: DC

## 2013-10-29 NOTE — Progress Notes (Signed)
Follow-up Visit   Date: 10/29/2013    [name] MRN: [id number] DOB: 1946/06/09   Interim History: [name] is a 68 y.o. right-handed Caucasian female with history of hypothyroidism, GERD, ulcerative colitis (diagnosed 2012, currently on remicade), rheumatoid arthritis, spinal stenosis s/p L5-S1 lamintectomy, asthma, depression, and depression returning to the clinic for follow-up of gait abnormality.  She was last seen in the office on 07/12/2013.  History of present illness: Around 2012, she started noticing that she would fall easily and her legs felt weak. She was seen here by Dr. Jacelyn Grip in October 2012 for the same complaints and sciatica and was being managed for left lumbar spine radiculopathy and possible peripheral neuropathy given history of ulcerative colitis. She underwent EMG in 04/2011 by [facility] which was consistent with left S1 radiculopathy. Imaging of her lumbar spine showed multilevel spondylosis and degenerative disc disease with impingement at all levels between L2 and S1. She was referred to Dr. Vertell Limber, neurosurgeon, who recommended surgery, but patient would like surgery only as a last resort. Conservative management with steroid epidural injections and physical therapy (2013) was pursued which alleviated pain, but did not improve leg weakness and falls.   Since then, her falls and gait unsteadiness has worsened. She falls about 4-5 times per month and always falls forwards. She does not have any prodromal symptoms.  Leaning forward, backwards, or to the side too far will cause her to fall. She is able to get up herself after falling without difficulty. She continues to have mild low back pain. She had sustained significant injuries from her falls, including lip laceration last year and fractured left scapular (2014), and broken her toe.   She also complains of short-term memory loss which has been ongoing for the past 3 years.  She is forgetting telephone numbers, cannot finish a sentence, or forgets tasks. Denies getting lost or any recent car accidents.   - Follow-up 07/12/2013:  EMG which showed mild-moderate distal and symmetric peripheral neuropathy.  She reports that her UC and RA seems to be well controlled on remicade and gait instability preceded starting remicade.  - Follow-up 10/29/2013:  Since her last visit, she had neuropsychiatric testing for memory changes which was consistent with mild cognitive impairment.  She did not complete PT because she travelled to [place].  She continues to have difficulty climbing stairs and raising out of a chair and feels that her legs are weaker. No back pain or bowel/bladder problems.  There has been no interval falls.     Medications:  Current Outpatient Prescriptions on File Prior to Visit  Medication Sig Dispense Refill  . ALPRAZolam (XANAX) 0.5 MG tablet Take 0.5 mg by mouth as needed for sleep.      [name] amoxicillin (AMOXIL) 875 MG tablet Take 1 tablet (875 mg total) by mouth 2 (two) times daily.  20 tablet  0  . buPROPion (WELLBUTRIN XL) 300 MG 24 hr tablet TAKE ONE TABLET EACH DAY  30 tablet  6  . celecoxib (CELEBREX) 200 MG capsule Take 1 capsule (200 mg total) by mouth daily.  30 capsule  11  . diazepam (VALIUM) 2 MG tablet Take 0.5 tablets (1 mg total) by mouth as directed. Take 1 mg and up to 4 mg every 6 hrs as needed for neck pain/spasm  60 tablet  2  . FLUoxetine (PROZAC) 20 MG capsule Take 2 capsules (40 mg total) by mouth daily.  60 capsule  3  .  inFLIXimab (REMICADE) 100 MG injection Inject into the vein. Every 2 months      . lamoTRIgine (LAMICTAL) 200 MG tablet Take 200 mg by mouth daily.        . Mesalamine (ASACOL HD) 800 MG TBEC Take 3 tablets twice daily  180 each  1  . methylphenidate (CONCERTA) 27 MG CR tablet Take 1 tablet (27 mg total) by mouth every morning.    0  . metoCLOPramide (REGLAN) 5 MG tablet Take 5 mg by mouth every 6 (six) hours as needed.       . pantoprazole (PROTONIX) 40 MG tablet TAKE ONE TABLET TWICE DAILY  60 tablet  5  . pirbuterol (MAXAIR) 200 MCG/INH inhaler Inhale 2 puffs into the lungs as needed. As needed for asthma/allergies.      . promethazine (PHENERGAN) 12.5 MG tablet Take by mouth every 6 (six) hours as needed.      [name] SYNTHROID 100 MCG tablet TAKE ONE TABLET EVERY MORNING  30 tablet  5  . zolpidem (AMBIEN) 10 MG tablet Take 10 mg by mouth at bedtime as needed for sleep.      . [DISCONTINUED] losartan-hydrochlorothiazide (HYZAAR) 50-12.5 MG per tablet Take 1 tablet by mouth daily.      . [DISCONTINUED] simvastatin (ZOCOR) 20 MG tablet Take 20 mg by mouth every evening.       No current facility-administered medications on file prior to visit.    Allergies:  Allergies  Allergen Reactions  . Ampicillin Nausea And Vomiting  . Erythromycin   . Morphine And Related      Review of Systems:  CONSTITUTIONAL: No fevers, chills, night sweats.   EYES: No visual changes or eye pain ENT: No hearing changes.  No history of nose bleeds.   RESPIRATORY: No cough, wheezing and shortness of breath.   CARDIOVASCULAR: Negative for chest pain, and palpitations.   GI: Negative for abdominal discomfort, blood in stools or black stools.  No recent change in bowel habits.   GU:  No history of incontinence.   MUSCLOSKELETAL: No history of joint pain or swelling.  No myalgias.   SKIN: Negative for lesions, rash, and itching.   ENDOCRINE: Negative for cold or heat intolerance, polydipsia or goiter.   PSYCH:  No depression or anxiety symptoms.   NEURO: As Above.   Vital Signs:  BP 164/80  Pulse 74  Ht 5' 4.96" (1.65 m)  Wt 151 lb (68.493 kg)  BMI 25.16 kg/m2  SpO2 95%  Neurological Exam: MENTAL STATUS including orientation to time, place, person, recent and remote memory, attention span and concentration, language, and fund of knowledge is normal.  Speech is not dysarthric.  CRANIAL NERVES: Pupils equal round and  reactive to light.  Normal conjugate, extra-ocular eye movements in all directions of gaze.  No ptosis. Face is symmetric.   MOTOR:  Motor strength is 5/5 in all extremities, except right hip flexion 5-/5.  No atrophy, fasciculations or abnormal movements.  No pronator drift.  Increased spascity of the legs (1).    MSRs:  Reflexes are 2+/4 throughout, except absent Achilles bilaterally.  SENSORY:  Vibration diminished to 40% at great toe bilaterally.  COORDINATION/GAIT:  Intact rapid alternating movements bilaterally.  Gait is tested unassisted and is scissoring at times and ataxic (improved from previously).    Data: MRI lumbar spine 04/19/2011:  Additional findings at individual levels are as follows:  L1-2: Diffuse disc bulge and facet arthropathy noted. No impingement.  L2-3: Diffuse disc bulge  noted along with a right inferior foraminal disc protrusion as well as a suspected left foraminal disc protrusion The AP diameter of the thecal sac is narrowed to 7 mm, compatible with moderate central stenosis. There is moderate left foraminal stenosis and left lateral extraforaminal disc material abuts the left L2 nerve in the lateral extraforaminal space. There is moderate bilateral subarticular lateral recess stenosis.  L3-4: Disc bulge noted with right foraminal disc protrusion. Degenerative facet encroachment noted, right greater left. There is mild to moderate central stenosis along with mild right foraminal stenosis. There is moderate right and borderline left subarticular lateral recess stenosis.  L4-5: Right greater than left facet arthropathy noted with diffuse disc osteophyte complex. In conjunction with the facet spurring there is moderate right foraminal stenosis as well as mild right subarticular lateral recess stenosis. The facet encroachment causes mild central stenosis.  L5-S1: Prior right laminectomy noted. Facet arthropathy and intervertebral spurring cause moderate right and mild left  foraminal stenosis.  IMPRESSION:  1. Lumbar spondylosis and degenerative disc disease with impingement at all levels between L2 and S1.   MRI/A brain 03/25/2013:  1. No evidence of acute ischemia.  2. Nonspecific subcortical white matter changes supratentorially and in the paramedian pons. These probably representsequelae of chronic microvascular ischemia which may relate to hypertension and/or diabetes, with less likely possibility of a vasculitis or demyelinating process. Clinical correlation suggested.  3. Probable sebaceous cyst in the left occipital subcutaneous region.  4. No occlusions, stenosis, dissections or aneurysms seen on the study.  EMG of the legs 07/05/2013:  There is electrophysiological evidence of a chronic sensoriomotor length-dependent, axonal polyneuropathy affecting bilateral lower extremities; mild-moderate in degree electrically.  Labs 07/12/2013:  Vitamin B1 10, MMA 0.12, zinc 73, vitamin E 1.6 Labs 07/01/2013:  2hr GGT 103*-130-91 Labs 06/25/2013:  Copper 125, SPEP/UPEP with IFE: no M protein Labs 11/05/2012:  B12 578, ESR 10, CK 151  Neuropsychiatric testing dated 10/19/2013: Mild cognitive impairment with overlap of anxiety, depression, and ADHD.    IMPRESSION/PLAN: 1.  Multilevel severe spinal stenosis with myelopathy  - Clinically with slight worsening of proximal leg weakness  - Previously seen Dr. Vertell Limber, patient not interested in surgery at this time  - Start PT for gait training and leg strengthening   - Start flexeril 40m qhs for muscle spasticity 2.  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  Multifactorial gait abnormality due to #1 and #2  - Start PT, muscle relaxants 4.  Mild cognitive impairment  - Recommended continuing to see psychotherapist for known  anxiety, depression, and ADHD  - Follow clinically 4. Ulcerative colitis 5. Rheumatoid arthritis 6. Return to clinic in 459-month  The duration of this appointment visit was 30 minutes of face-to-face time with the patient.  Greater than 50% of this time was spent in counseling, explanation of diagnosis, planning of further management, and coordination of care.   Thank you for allowing me to participate in patient's care.  If I can answer any additional questions, I would be pleased to do so.    Sincerely,    Donika K. PaPosey ProntoDO

## 2013-10-29 NOTE — Patient Instructions (Addendum)
1.  Start flexeril 26m at bedtime 2.  Start physical therapy for leg strengthening and gait training  3.  Return to clinic in 424-month

## 2013-10-31 DIAGNOSIS — R079 Chest pain, unspecified: Secondary | ICD-10-CM | POA: Diagnosis not present

## 2013-10-31 DIAGNOSIS — W108XXA Fall (on) (from) other stairs and steps, initial encounter: Secondary | ICD-10-CM | POA: Diagnosis not present

## 2013-10-31 DIAGNOSIS — K5190 Ulcerative colitis, unspecified, without complications: Secondary | ICD-10-CM | POA: Diagnosis not present

## 2013-10-31 DIAGNOSIS — D62 Acute posthemorrhagic anemia: Secondary | ICD-10-CM | POA: Diagnosis not present

## 2013-10-31 DIAGNOSIS — G8918 Other acute postprocedural pain: Secondary | ICD-10-CM | POA: Diagnosis not present

## 2013-10-31 DIAGNOSIS — W19XXXA Unspecified fall, initial encounter: Secondary | ICD-10-CM | POA: Diagnosis not present

## 2013-10-31 DIAGNOSIS — S72143A Displaced intertrochanteric fracture of unspecified femur, initial encounter for closed fracture: Secondary | ICD-10-CM | POA: Diagnosis present

## 2013-10-31 DIAGNOSIS — R5381 Other malaise: Secondary | ICD-10-CM | POA: Diagnosis not present

## 2013-10-31 DIAGNOSIS — R5383 Other fatigue: Secondary | ICD-10-CM | POA: Diagnosis not present

## 2013-10-31 DIAGNOSIS — K219 Gastro-esophageal reflux disease without esophagitis: Secondary | ICD-10-CM | POA: Diagnosis present

## 2013-10-31 DIAGNOSIS — G8929 Other chronic pain: Secondary | ICD-10-CM | POA: Diagnosis present

## 2013-10-31 DIAGNOSIS — D500 Iron deficiency anemia secondary to blood loss (chronic): Secondary | ICD-10-CM | POA: Diagnosis not present

## 2013-10-31 DIAGNOSIS — F411 Generalized anxiety disorder: Secondary | ICD-10-CM | POA: Diagnosis present

## 2013-10-31 DIAGNOSIS — Z9181 History of falling: Secondary | ICD-10-CM | POA: Diagnosis not present

## 2013-10-31 DIAGNOSIS — S72009A Fracture of unspecified part of neck of unspecified femur, initial encounter for closed fracture: Secondary | ICD-10-CM | POA: Diagnosis not present

## 2013-10-31 DIAGNOSIS — Z79899 Other long term (current) drug therapy: Secondary | ICD-10-CM | POA: Diagnosis not present

## 2013-10-31 DIAGNOSIS — E039 Hypothyroidism, unspecified: Secondary | ICD-10-CM | POA: Diagnosis not present

## 2013-10-31 DIAGNOSIS — M549 Dorsalgia, unspecified: Secondary | ICD-10-CM | POA: Diagnosis present

## 2013-10-31 DIAGNOSIS — F319 Bipolar disorder, unspecified: Secondary | ICD-10-CM | POA: Diagnosis present

## 2013-10-31 DIAGNOSIS — F3289 Other specified depressive episodes: Secondary | ICD-10-CM | POA: Diagnosis not present

## 2013-10-31 DIAGNOSIS — R030 Elevated blood-pressure reading, without diagnosis of hypertension: Secondary | ICD-10-CM | POA: Diagnosis not present

## 2013-10-31 DIAGNOSIS — IMO0002 Reserved for concepts with insufficient information to code with codable children: Secondary | ICD-10-CM | POA: Diagnosis not present

## 2013-10-31 DIAGNOSIS — Z5189 Encounter for other specified aftercare: Secondary | ICD-10-CM | POA: Diagnosis not present

## 2013-10-31 DIAGNOSIS — R269 Unspecified abnormalities of gait and mobility: Secondary | ICD-10-CM | POA: Diagnosis not present

## 2013-10-31 DIAGNOSIS — M25559 Pain in unspecified hip: Secondary | ICD-10-CM | POA: Diagnosis not present

## 2013-10-31 DIAGNOSIS — F329 Major depressive disorder, single episode, unspecified: Secondary | ICD-10-CM | POA: Diagnosis not present

## 2013-10-31 DIAGNOSIS — Z4789 Encounter for other orthopedic aftercare: Secondary | ICD-10-CM | POA: Diagnosis not present

## 2013-10-31 DIAGNOSIS — Z9889 Other specified postprocedural states: Secondary | ICD-10-CM | POA: Diagnosis not present

## 2013-10-31 DIAGNOSIS — S79919A Unspecified injury of unspecified hip, initial encounter: Secondary | ICD-10-CM | POA: Diagnosis not present

## 2013-11-04 DIAGNOSIS — Z9181 History of falling: Secondary | ICD-10-CM | POA: Diagnosis not present

## 2013-11-04 DIAGNOSIS — R269 Unspecified abnormalities of gait and mobility: Secondary | ICD-10-CM | POA: Diagnosis not present

## 2013-11-04 DIAGNOSIS — K219 Gastro-esophageal reflux disease without esophagitis: Secondary | ICD-10-CM | POA: Diagnosis not present

## 2013-11-04 DIAGNOSIS — Z5189 Encounter for other specified aftercare: Secondary | ICD-10-CM | POA: Diagnosis not present

## 2013-11-04 DIAGNOSIS — Z9889 Other specified postprocedural states: Secondary | ICD-10-CM | POA: Diagnosis not present

## 2013-11-04 DIAGNOSIS — IMO0002 Reserved for concepts with insufficient information to code with codable children: Secondary | ICD-10-CM | POA: Diagnosis not present

## 2013-11-04 DIAGNOSIS — D500 Iron deficiency anemia secondary to blood loss (chronic): Secondary | ICD-10-CM | POA: Diagnosis not present

## 2013-11-04 DIAGNOSIS — S72009A Fracture of unspecified part of neck of unspecified femur, initial encounter for closed fracture: Secondary | ICD-10-CM | POA: Diagnosis not present

## 2013-11-04 DIAGNOSIS — F411 Generalized anxiety disorder: Secondary | ICD-10-CM | POA: Diagnosis not present

## 2013-11-04 DIAGNOSIS — M549 Dorsalgia, unspecified: Secondary | ICD-10-CM | POA: Diagnosis not present

## 2013-11-04 DIAGNOSIS — Z79899 Other long term (current) drug therapy: Secondary | ICD-10-CM | POA: Diagnosis not present

## 2013-11-04 DIAGNOSIS — F329 Major depressive disorder, single episode, unspecified: Secondary | ICD-10-CM | POA: Diagnosis not present

## 2013-11-04 DIAGNOSIS — G8929 Other chronic pain: Secondary | ICD-10-CM | POA: Diagnosis not present

## 2013-11-04 DIAGNOSIS — E039 Hypothyroidism, unspecified: Secondary | ICD-10-CM | POA: Diagnosis not present

## 2013-11-04 DIAGNOSIS — M25559 Pain in unspecified hip: Secondary | ICD-10-CM | POA: Diagnosis not present

## 2013-11-04 DIAGNOSIS — D62 Acute posthemorrhagic anemia: Secondary | ICD-10-CM | POA: Diagnosis not present

## 2013-11-04 DIAGNOSIS — W19XXXA Unspecified fall, initial encounter: Secondary | ICD-10-CM | POA: Diagnosis not present

## 2013-11-04 DIAGNOSIS — R5383 Other fatigue: Secondary | ICD-10-CM | POA: Diagnosis not present

## 2013-11-04 DIAGNOSIS — F3289 Other specified depressive episodes: Secondary | ICD-10-CM | POA: Diagnosis not present

## 2013-11-04 DIAGNOSIS — R5381 Other malaise: Secondary | ICD-10-CM | POA: Diagnosis not present

## 2013-11-08 ENCOUNTER — Telehealth: Payer: Self-pay | Admitting: Neurology

## 2013-11-08 ENCOUNTER — Other Ambulatory Visit: Payer: Self-pay | Admitting: *Deleted

## 2013-11-08 NOTE — Telephone Encounter (Signed)
Pt fell this weekend and would like to talk to someone please call her back

## 2013-11-08 NOTE — Telephone Encounter (Signed)
Patient fell on Mother's day weekend and she is in [place] recovering.  She was supposed to start PT here but they have not called her yet.  She is doing PT in [place] right now so I just told her to continue there until she returns and can set up appointment.

## 2013-11-08 NOTE — Telephone Encounter (Signed)
Received fax stating received electronic refill on 10/05/13 for pt fluoxetine 20 mg. Pt states she should be taking 3 caps daily (25m)- that is what we have been filling for from Dr. LSabra Heck Requesting new rx w/correct directions 90 day supply. Faxed back denied pt need to establish with new PCP since Dr. NLinda Hedgeshas retired.../[name]

## 2013-11-15 DIAGNOSIS — IMO0002 Reserved for concepts with insufficient information to code with codable children: Secondary | ICD-10-CM | POA: Diagnosis not present

## 2013-11-19 ENCOUNTER — Telehealth: Payer: Self-pay | Admitting: Neurology

## 2013-11-19 NOTE — Telephone Encounter (Signed)
Has broken her hip, call about PT. CB# [phone] / Sherri S.

## 2013-11-19 NOTE — Telephone Encounter (Signed)
Patient is wanting to go to rehab.  She will call the place where she had been before and have them call us for a referral.

## 2013-11-20 DIAGNOSIS — S72009A Fracture of unspecified part of neck of unspecified femur, initial encounter for closed fracture: Secondary | ICD-10-CM | POA: Diagnosis not present

## 2013-11-20 DIAGNOSIS — R5383 Other fatigue: Secondary | ICD-10-CM | POA: Diagnosis not present

## 2013-11-20 DIAGNOSIS — D62 Acute posthemorrhagic anemia: Secondary | ICD-10-CM | POA: Diagnosis not present

## 2013-11-20 DIAGNOSIS — R5381 Other malaise: Secondary | ICD-10-CM | POA: Diagnosis not present

## 2013-11-20 DIAGNOSIS — Z9889 Other specified postprocedural states: Secondary | ICD-10-CM | POA: Diagnosis not present

## 2013-11-24 DIAGNOSIS — R269 Unspecified abnormalities of gait and mobility: Secondary | ICD-10-CM | POA: Diagnosis not present

## 2013-11-24 DIAGNOSIS — IMO0001 Reserved for inherently not codable concepts without codable children: Secondary | ICD-10-CM | POA: Diagnosis not present

## 2013-11-25 ENCOUNTER — Ambulatory Visit: Payer: Medicare Other | Admitting: Internal Medicine

## 2013-11-26 DIAGNOSIS — R269 Unspecified abnormalities of gait and mobility: Secondary | ICD-10-CM | POA: Diagnosis not present

## 2013-11-26 DIAGNOSIS — IMO0001 Reserved for inherently not codable concepts without codable children: Secondary | ICD-10-CM | POA: Diagnosis not present

## 2013-11-30 ENCOUNTER — Telehealth: Payer: Self-pay | Admitting: Neurology

## 2013-11-30 DIAGNOSIS — R269 Unspecified abnormalities of gait and mobility: Secondary | ICD-10-CM | POA: Diagnosis not present

## 2013-11-30 DIAGNOSIS — IMO0001 Reserved for inherently not codable concepts without codable children: Secondary | ICD-10-CM | POA: Diagnosis not present

## 2013-11-30 NOTE — Telephone Encounter (Signed)
Please call [phone] she would like to talk to someone

## 2013-11-30 NOTE — Telephone Encounter (Signed)
Patient called stating that she is still at her [facility] and doing PT in [place].  She will be staying there until she is better.  Her doctor and therapist will be calling here for her notes.

## 2013-12-02 DIAGNOSIS — R269 Unspecified abnormalities of gait and mobility: Secondary | ICD-10-CM | POA: Diagnosis not present

## 2013-12-02 DIAGNOSIS — IMO0001 Reserved for inherently not codable concepts without codable children: Secondary | ICD-10-CM | POA: Diagnosis not present

## 2013-12-06 DIAGNOSIS — IMO0002 Reserved for concepts with insufficient information to code with codable children: Secondary | ICD-10-CM | POA: Diagnosis not present

## 2013-12-06 DIAGNOSIS — S72143A Displaced intertrochanteric fracture of unspecified femur, initial encounter for closed fracture: Secondary | ICD-10-CM | POA: Diagnosis not present

## 2013-12-07 DIAGNOSIS — IMO0001 Reserved for inherently not codable concepts without codable children: Secondary | ICD-10-CM | POA: Diagnosis not present

## 2013-12-07 DIAGNOSIS — R269 Unspecified abnormalities of gait and mobility: Secondary | ICD-10-CM | POA: Diagnosis not present

## 2013-12-09 DIAGNOSIS — R269 Unspecified abnormalities of gait and mobility: Secondary | ICD-10-CM | POA: Diagnosis not present

## 2013-12-09 DIAGNOSIS — IMO0001 Reserved for inherently not codable concepts without codable children: Secondary | ICD-10-CM | POA: Diagnosis not present

## 2013-12-10 DIAGNOSIS — R269 Unspecified abnormalities of gait and mobility: Secondary | ICD-10-CM | POA: Diagnosis not present

## 2013-12-10 DIAGNOSIS — IMO0001 Reserved for inherently not codable concepts without codable children: Secondary | ICD-10-CM | POA: Diagnosis not present

## 2013-12-11 DIAGNOSIS — M545 Low back pain, unspecified: Secondary | ICD-10-CM | POA: Diagnosis not present

## 2013-12-14 ENCOUNTER — Telehealth: Payer: Self-pay | Admitting: Neurology

## 2013-12-14 DIAGNOSIS — R269 Unspecified abnormalities of gait and mobility: Secondary | ICD-10-CM | POA: Diagnosis not present

## 2013-12-14 DIAGNOSIS — IMO0001 Reserved for inherently not codable concepts without codable children: Secondary | ICD-10-CM | POA: Diagnosis not present

## 2013-12-14 NOTE — Telephone Encounter (Signed)
Patient called to let us know that she is still in [place] and had MRI done of her hip.  She will have results sent to [place].

## 2013-12-14 NOTE — Telephone Encounter (Signed)
Pt called requesting to speak to a nurse regarding her current condition. C/b [phone]

## 2013-12-15 ENCOUNTER — Ambulatory Visit: Payer: Medicare Other | Admitting: Internal Medicine

## 2013-12-15 NOTE — Telephone Encounter (Signed)
Imaging report received from [facility]   MRI lumbar spine without contrast 12/11/2013: Advanced multilevel degenerative disc disease and facet arthropathy. Moderate central canal stenosis and neural foraminal stenosis at L3-4. Severe right greater than left neural foraminal stenosis at L4-5 and L5-S1.  Donika K. Posey Pronto, DO

## 2013-12-16 DIAGNOSIS — R269 Unspecified abnormalities of gait and mobility: Secondary | ICD-10-CM | POA: Diagnosis not present

## 2013-12-16 DIAGNOSIS — IMO0001 Reserved for inherently not codable concepts without codable children: Secondary | ICD-10-CM | POA: Diagnosis not present

## 2013-12-17 ENCOUNTER — Telehealth: Payer: Self-pay | Admitting: Neurology

## 2013-12-17 NOTE — Telephone Encounter (Signed)
Recommend patient to follow-up with Dr. Vertell Limber for surgical evaluation, discussed goal of surgery is to prevent further injury.  Imaging with known multilevel degenerative changes and stenosis.  Donika K. Posey Pronto, DO

## 2013-12-17 NOTE — Telephone Encounter (Signed)
Pt needs to talk to someone please call [phone]

## 2013-12-17 NOTE — Telephone Encounter (Signed)
She called back again and wants to know what you think about MRI.  What do you think about surgery.

## 2013-12-21 DIAGNOSIS — IMO0001 Reserved for inherently not codable concepts without codable children: Secondary | ICD-10-CM | POA: Diagnosis not present

## 2013-12-21 DIAGNOSIS — R269 Unspecified abnormalities of gait and mobility: Secondary | ICD-10-CM | POA: Diagnosis not present

## 2013-12-22 DIAGNOSIS — IMO0002 Reserved for concepts with insufficient information to code with codable children: Secondary | ICD-10-CM | POA: Diagnosis not present

## 2013-12-23 ENCOUNTER — Telehealth: Payer: Self-pay | Admitting: *Deleted

## 2013-12-23 DIAGNOSIS — R269 Unspecified abnormalities of gait and mobility: Secondary | ICD-10-CM | POA: Diagnosis not present

## 2013-12-23 DIAGNOSIS — IMO0001 Reserved for inherently not codable concepts without codable children: Secondary | ICD-10-CM | POA: Diagnosis not present

## 2013-12-23 NOTE — Telephone Encounter (Signed)
Left msg on triage stating that she had fractured her hip and needing to see surgeon. She stated she contacted Dr. Linda Hedges and he told her to just call the office and someone can place referral. Called pt back no answer left msg on vm will need to make appt with a physician here before they can set-up referral.../lmb

## 2013-12-24 DIAGNOSIS — R269 Unspecified abnormalities of gait and mobility: Secondary | ICD-10-CM | POA: Diagnosis not present

## 2013-12-24 DIAGNOSIS — IMO0001 Reserved for inherently not codable concepts without codable children: Secondary | ICD-10-CM | POA: Diagnosis not present

## 2013-12-28 DIAGNOSIS — K5100 Ulcerative (chronic) pancolitis without complications: Secondary | ICD-10-CM | POA: Diagnosis not present

## 2013-12-29 DIAGNOSIS — IMO0001 Reserved for inherently not codable concepts without codable children: Secondary | ICD-10-CM | POA: Diagnosis not present

## 2013-12-29 DIAGNOSIS — R269 Unspecified abnormalities of gait and mobility: Secondary | ICD-10-CM | POA: Diagnosis not present

## 2013-12-31 DIAGNOSIS — R269 Unspecified abnormalities of gait and mobility: Secondary | ICD-10-CM | POA: Diagnosis not present

## 2013-12-31 DIAGNOSIS — IMO0001 Reserved for inherently not codable concepts without codable children: Secondary | ICD-10-CM | POA: Diagnosis not present

## 2014-01-04 DIAGNOSIS — R269 Unspecified abnormalities of gait and mobility: Secondary | ICD-10-CM | POA: Diagnosis not present

## 2014-01-04 DIAGNOSIS — IMO0001 Reserved for inherently not codable concepts without codable children: Secondary | ICD-10-CM | POA: Diagnosis not present

## 2014-01-07 DIAGNOSIS — IMO0001 Reserved for inherently not codable concepts without codable children: Secondary | ICD-10-CM | POA: Diagnosis not present

## 2014-01-07 DIAGNOSIS — R269 Unspecified abnormalities of gait and mobility: Secondary | ICD-10-CM | POA: Diagnosis not present

## 2014-01-10 DIAGNOSIS — IMO0001 Reserved for inherently not codable concepts without codable children: Secondary | ICD-10-CM | POA: Diagnosis not present

## 2014-01-10 DIAGNOSIS — R269 Unspecified abnormalities of gait and mobility: Secondary | ICD-10-CM | POA: Diagnosis not present

## 2014-01-14 ENCOUNTER — Telehealth: Payer: Self-pay | Admitting: Neurology

## 2014-01-18 ENCOUNTER — Ambulatory Visit: Payer: Medicare Other | Admitting: Internal Medicine

## 2014-01-19 DIAGNOSIS — M25559 Pain in unspecified hip: Secondary | ICD-10-CM | POA: Diagnosis not present

## 2014-01-20 ENCOUNTER — Telehealth: Payer: Self-pay | Admitting: *Deleted

## 2014-01-20 DIAGNOSIS — J45909 Unspecified asthma, uncomplicated: Secondary | ICD-10-CM

## 2014-01-20 MED ORDER — MOMETASONE FURO-FORMOTEROL FUM 100-5 MCG/ACT IN AERO: 2.0000 | INHALATION_SPRAY | Freq: Two times a day (BID) | RESPIRATORY_TRACT | Status: DC

## 2014-01-20 NOTE — Telephone Encounter (Signed)
Called pt no answer LMOM the change on inhaler new rx has been sent to [facility] to replace the maxair...[name]

## 2014-01-20 NOTE — Telephone Encounter (Signed)
Try dulera??

## 2014-01-20 NOTE — Telephone Encounter (Signed)
Left msg on triage stating pt is wanting a refill on her maxair inhaler they no longer make that inhaler. Wanting to see if md can change to something else to replace...[name]

## 2014-01-21 DIAGNOSIS — R269 Unspecified abnormalities of gait and mobility: Secondary | ICD-10-CM | POA: Diagnosis not present

## 2014-01-21 DIAGNOSIS — IMO0001 Reserved for inherently not codable concepts without codable children: Secondary | ICD-10-CM | POA: Diagnosis not present

## 2014-01-24 DIAGNOSIS — M545 Low back pain, unspecified: Secondary | ICD-10-CM | POA: Diagnosis not present

## 2014-01-24 DIAGNOSIS — M4120 Other idiopathic scoliosis, site unspecified: Secondary | ICD-10-CM | POA: Diagnosis not present

## 2014-01-24 DIAGNOSIS — M5126 Other intervertebral disc displacement, lumbar region: Secondary | ICD-10-CM | POA: Diagnosis not present

## 2014-01-24 DIAGNOSIS — M48061 Spinal stenosis, lumbar region without neurogenic claudication: Secondary | ICD-10-CM | POA: Diagnosis not present

## 2014-01-24 DIAGNOSIS — IMO0002 Reserved for concepts with insufficient information to code with codable children: Secondary | ICD-10-CM | POA: Diagnosis not present

## 2014-01-25 ENCOUNTER — Telehealth: Payer: Self-pay | Admitting: *Deleted

## 2014-01-25 DIAGNOSIS — J45909 Unspecified asthma, uncomplicated: Secondary | ICD-10-CM

## 2014-01-25 MED ORDER — ALBUTEROL SULFATE 108 (90 BASE) MCG/ACT IN AEPB: 1.0000 | INHALATION_SPRAY | Freq: Four times a day (QID) | RESPIRATORY_TRACT | Status: DC | PRN

## 2014-01-25 NOTE — Telephone Encounter (Signed)
Left msg on triage stating md sent a inhaler in by the name of Dulera. Pt doesn't think she need that she is wanting a rescue inhaler instead. Ex proventil, Proair or albuterol. The dulero cost $100 copay...[name]

## 2014-01-28 ENCOUNTER — Encounter: Payer: Self-pay | Admitting: Internal Medicine

## 2014-01-28 ENCOUNTER — Ambulatory Visit (INDEPENDENT_AMBULATORY_CARE_PROVIDER_SITE_OTHER): Payer: Medicare Other | Admitting: Internal Medicine

## 2014-01-28 VITALS — BP 158/80 | HR 80 | Temp 98.5°F | Wt 152.8 lb

## 2014-01-28 DIAGNOSIS — F3289 Other specified depressive episodes: Secondary | ICD-10-CM

## 2014-01-28 DIAGNOSIS — Z79899 Other long term (current) drug therapy: Secondary | ICD-10-CM | POA: Diagnosis not present

## 2014-01-28 DIAGNOSIS — N281 Cyst of kidney, acquired: Secondary | ICD-10-CM

## 2014-01-28 DIAGNOSIS — F329 Major depressive disorder, single episode, unspecified: Secondary | ICD-10-CM | POA: Diagnosis not present

## 2014-01-28 DIAGNOSIS — Q619 Cystic kidney disease, unspecified: Secondary | ICD-10-CM | POA: Diagnosis not present

## 2014-01-28 NOTE — Progress Notes (Signed)
Pre visit review using our clinic review tool, if applicable. No additional management support is needed unless otherwise documented below in the visit note. 

## 2014-01-28 NOTE — Progress Notes (Signed)
   Subjective:    Patient ID: [name], female    DOB: Jul 06, 1945, 68 y.o.   MRN: [id number]  HPI  She is here to refill her medications. Her long-term primary care physician,Dr Norins, as well as her psychiatrist,Dr Denmark, have retired. 18 medications are listed as active; some on the [facility] list were discussed with explanation of the potential adverse effects. She has had recent hip surgery following a fall. Recurrent falls are an issue & she employs a cane. She's been taking Celebrex for >  two years to treat musculoskeletal pain. Its cardiac (increased risk of heart failure) and gastrointestinal potential risks were discussed. She also has Tramadol which is not on that list; this may be a safer option as long as it is taken as needed only at the lowest effective dose. Specific medications with increased risk of altered mental status and increased risk of falls were reviewed. She states that she rarely takes cyclobenzaprine,alprazolam, diazepam or zolpidem. Psychotropic drugs include Bupropion 300 mg daily; fluoxetine 20 mg daily; Lamictal 200 mg daily; & Concerta 27 mg daily. Because of the complexity of this regimen; I recommended a Psychaitry consultation .That specialist in neurochemistry may be be able to simplify this polypharmacy regimen.      Review of Systems   An incidental finding @ the time of her hip surgery was a kidney "polyp" ( ? Renal cyst).   Objective:   Physical Exam No physical exam completed. Psych:  Cognition and judgment appear intact. Obviuosly intelligent.Alert, communicative  and cooperative with normal attention span and concentration. Oriented X 3. No apparent delusions, illusions, hallucinations.She stands with difficulty & ambulates with a broad ,slightly unsteady gait employing a cane.         Assessment & Plan:   #1 polypharmacy with a complex psychotropic regimen far beyond my expertise and multiple agents which dramatically increase risk of falls  or cardiac /GI adverse affects.  She is receptive to pursuing such a review of her medical regimen despite the emotional trauma of having to interact with a new physician with whom she has no established rapport.  #2 ? Renal cysts; medical records requested for review

## 2014-01-31 NOTE — Patient Instructions (Signed)
I recommend a Psychiatry consultation to determine optimal medical therapy;that physician will assess your present regimen & employ their expertise in neurochemistry to hopefully simplify this therapeutic program and decrease risk of drug:drug interaction. As we discussed ,you are on multiple medications which experts have documented to have a very  high risk of affecting  mental  alertness  & balance. This results in increased risk of falling with serious health or life threatening injury. Such medications should be taken as infrequently as possible and @  the lowest possible dose.These should not be taken with alcohol, sedatives  or other agents which have a similar  adverse risk potential. These risks are greater as we age as there is decreased ability of the liver and kidneys to metabolize and excrete the medication, resulting in   increased blood levels of the active ingredients.

## 2014-02-01 ENCOUNTER — Other Ambulatory Visit: Payer: Self-pay | Admitting: Neurosurgery

## 2014-02-01 DIAGNOSIS — M419 Scoliosis, unspecified: Secondary | ICD-10-CM

## 2014-02-02 ENCOUNTER — Other Ambulatory Visit: Payer: Self-pay | Admitting: Neurosurgery

## 2014-02-02 DIAGNOSIS — M8580 Other specified disorders of bone density and structure, unspecified site: Secondary | ICD-10-CM

## 2014-02-03 ENCOUNTER — Other Ambulatory Visit: Payer: Self-pay | Admitting: *Deleted

## 2014-02-03 MED ORDER — CELECOXIB 200 MG PO CAPS: 200.0000 mg | ORAL_CAPSULE | Freq: Every day | ORAL | Status: DC

## 2014-02-04 ENCOUNTER — Telehealth: Payer: Self-pay | Admitting: *Deleted

## 2014-02-04 NOTE — Telephone Encounter (Signed)
Unfortunately I have never examined this patient, and do not feel comfortable with pain medication prescribing.  Please take tylenol OTC for now, f/u with PCP regarding pain

## 2014-02-04 NOTE — Telephone Encounter (Signed)
Left smg on triage stating Dr. Linna Darner d/c her celebrex, but didn't give her anything to replace. Pt states she is in a lot of pain she can't hardly walk. Requesting md to rx alternative...[name]

## 2014-02-07 MED ORDER — TRAMADOL HCL 50 MG PO TABS: 50.0000 mg | ORAL_TABLET | Freq: Two times a day (BID) | ORAL | Status: DC | PRN

## 2014-02-07 NOTE — Telephone Encounter (Signed)
Tramadol 50 mg q 12 hrs prn only #30  Needs to establish with new PCP

## 2014-02-07 NOTE — Telephone Encounter (Signed)
Called pharmacy spoke with [facility] gave md authorization for tramadol. Called pt no answer LMOM with md response...[name]

## 2014-02-07 NOTE — Telephone Encounter (Signed)
Dr. Linna Darner pt stated you d/c her celebrex. She is wanting to continue taking or another alternative. Pls advise...[name]

## 2014-02-10 ENCOUNTER — Telehealth: Payer: Self-pay | Admitting: *Deleted

## 2014-02-10 NOTE — Telephone Encounter (Signed)
   There are significant health concerns about taking meloxicam for prolonged periods.  She has significant degenerative disc disease. I would recommend consultation with a nonoperative back specialist such as Dr. Nelva Bush or Dr Ron Agee  to determine all optimal therapy.  I can order Meloxicam short term until seen by one of them

## 2014-02-10 NOTE — Telephone Encounter (Signed)
Called pt no answer LMOM RTC.../lmb 

## 2014-02-10 NOTE — Telephone Encounter (Signed)
Left msg on triage stating the tramadol is not helping at all. Requesting to go back on the celebrex or prescribe something else...[name]

## 2014-02-11 NOTE — Telephone Encounter (Signed)
Called pt no answer LMOM RTC.../lmb 

## 2014-02-11 NOTE — Telephone Encounter (Signed)
Called pt gave her md response below. Pt stated she already see a neurosurgeon " Dr. Vertell Limber" and he may due surgery depending on her bone scan that is set up for Tues. She states it ok to not take the celebrex but she need something for the pain. She also has a broken hip which making it worse. She was once taking Kepaprosen but was d/c by Dr. Linda Hedges. Pt is requesting md to rx it again...[name]

## 2014-02-11 NOTE — Telephone Encounter (Signed)
Celebrex 100 mg  Bid prn #20. Dr Vertell Limber will need to Rx what he feels is best long term as he is expert

## 2014-02-14 ENCOUNTER — Telehealth: Payer: Self-pay | Admitting: Internal Medicine

## 2014-02-14 MED ORDER — KETOPROFEN 75 MG PO CAPS: 75.0000 mg | ORAL_CAPSULE | Freq: Three times a day (TID) | ORAL | Status: DC

## 2014-02-14 NOTE — Telephone Encounter (Signed)
Orudis 75 mg one 3 times a day as needed with food only. Dispense 21.  This medication should be taken as infrequently as possible because of its long term cardiovascular and gastrointestinal risk.

## 2014-02-14 NOTE — Telephone Encounter (Signed)
Returning call in regards to med from Dr. Linna Darner.

## 2014-02-14 NOTE — Telephone Encounter (Signed)
Pt requesting refill on orudis 75 mg tid prn for pain, pt is establishing with dr. Doug Sou on 03/30/2014.  Please advise

## 2014-02-14 NOTE — Telephone Encounter (Signed)
See renewal for Orudis tid prn only

## 2014-02-15 ENCOUNTER — Ambulatory Visit
Admission: RE | Admit: 2014-02-15 | Discharge: 2014-02-15 | Disposition: A | Discharge status: Home / Self Care | Payer: Medicare Other | Source: Ambulatory Visit | Attending: Neurosurgery | Admitting: Neurosurgery

## 2014-02-15 ENCOUNTER — Other Ambulatory Visit: Payer: Self-pay | Admitting: Internal Medicine

## 2014-02-15 ENCOUNTER — Other Ambulatory Visit: Payer: Self-pay

## 2014-02-15 DIAGNOSIS — M949 Disorder of cartilage, unspecified: Secondary | ICD-10-CM | POA: Diagnosis not present

## 2014-02-15 DIAGNOSIS — M8580 Other specified disorders of bone density and structure, unspecified site: Secondary | ICD-10-CM

## 2014-02-15 DIAGNOSIS — M899 Disorder of bone, unspecified: Secondary | ICD-10-CM | POA: Diagnosis not present

## 2014-02-15 DIAGNOSIS — E039 Hypothyroidism, unspecified: Secondary | ICD-10-CM

## 2014-02-15 MED ORDER — SYNTHROID 100 MCG PO TABS: ORAL_TABLET | ORAL | Status: DC

## 2014-02-15 NOTE — Telephone Encounter (Signed)
Pt notified, rx sent to pharm

## 2014-02-15 NOTE — Telephone Encounter (Signed)
#  30 but needs TSH ; last done 6/14 Orders entered

## 2014-02-15 NOTE — Telephone Encounter (Signed)
Okay for refill until 03/30/14 appt with Dr Doug Sou?

## 2014-02-17 ENCOUNTER — Telehealth: Payer: Self-pay | Admitting: Internal Medicine

## 2014-02-17 NOTE — Telephone Encounter (Signed)
Please review the mental health providers available to you; this will be  on the back of your insurance card as a [phone]- [phone]. Please provide me with that list ; I'll help you choose

## 2014-02-17 NOTE — Telephone Encounter (Signed)
Patient states Dr. Linna Darner gave her the name of a psychiatrist.  She has forgotten this name and would like a call in regards.

## 2014-02-18 NOTE — Telephone Encounter (Signed)
Pt never return call bck closing encounter...[name]

## 2014-02-18 NOTE — Telephone Encounter (Signed)
Patient has been advised

## 2014-02-22 ENCOUNTER — Other Ambulatory Visit: Payer: Medicare Other

## 2014-02-25 DIAGNOSIS — K5289 Other specified noninfective gastroenteritis and colitis: Secondary | ICD-10-CM | POA: Diagnosis not present

## 2014-02-25 DIAGNOSIS — K5100 Ulcerative (chronic) pancolitis without complications: Secondary | ICD-10-CM | POA: Diagnosis not present

## 2014-03-01 ENCOUNTER — Telehealth: Payer: Self-pay

## 2014-03-01 DIAGNOSIS — F988 Other specified behavioral and emotional disorders with onset usually occurring in childhood and adolescence: Secondary | ICD-10-CM | POA: Diagnosis not present

## 2014-03-01 DIAGNOSIS — F331 Major depressive disorder, recurrent, moderate: Secondary | ICD-10-CM | POA: Diagnosis not present

## 2014-03-01 MED ORDER — MESALAMINE 800 MG PO TBEC: 3.0000 | DELAYED_RELEASE_TABLET | Freq: Every day | ORAL | Status: DC

## 2014-03-01 NOTE — Telephone Encounter (Signed)
Refilled Asacol 

## 2014-03-03 ENCOUNTER — Telehealth: Payer: Self-pay | Admitting: Neurology

## 2014-03-03 NOTE — Telephone Encounter (Signed)
Pt called and states that she broke her hip and can not make it 03-04-14 and will call back to resch

## 2014-03-04 ENCOUNTER — Ambulatory Visit: Payer: Medicare Other | Admitting: Neurology

## 2014-03-08 ENCOUNTER — Other Ambulatory Visit: Payer: Self-pay | Admitting: Geriatric Medicine

## 2014-03-08 MED ORDER — PANTOPRAZOLE SODIUM 40 MG PO TBEC: DELAYED_RELEASE_TABLET | ORAL | Status: DC

## 2014-03-14 ENCOUNTER — Other Ambulatory Visit: Payer: Self-pay | Admitting: Internal Medicine

## 2014-03-14 ENCOUNTER — Ambulatory Visit
Admission: RE | Admit: 2014-03-14 | Discharge: 2014-03-14 | Disposition: A | Discharge status: Home / Self Care | Payer: Medicare Other | Source: Ambulatory Visit | Attending: Neurosurgery | Admitting: Neurosurgery

## 2014-03-14 ENCOUNTER — Other Ambulatory Visit: Payer: Self-pay | Admitting: Neurosurgery

## 2014-03-14 DIAGNOSIS — M4120 Other idiopathic scoliosis, site unspecified: Secondary | ICD-10-CM

## 2014-03-14 DIAGNOSIS — IMO0002 Reserved for concepts with insufficient information to code with codable children: Secondary | ICD-10-CM | POA: Diagnosis not present

## 2014-03-14 DIAGNOSIS — M48061 Spinal stenosis, lumbar region without neurogenic claudication: Secondary | ICD-10-CM | POA: Diagnosis not present

## 2014-03-14 DIAGNOSIS — M5126 Other intervertebral disc displacement, lumbar region: Secondary | ICD-10-CM | POA: Diagnosis not present

## 2014-03-21 NOTE — Telephone Encounter (Signed)
Error

## 2014-03-23 DIAGNOSIS — K5100 Ulcerative (chronic) pancolitis without complications: Secondary | ICD-10-CM | POA: Diagnosis not present

## 2014-03-28 ENCOUNTER — Other Ambulatory Visit: Payer: Self-pay | Admitting: Neurosurgery

## 2014-03-30 ENCOUNTER — Ambulatory Visit (INDEPENDENT_AMBULATORY_CARE_PROVIDER_SITE_OTHER): Payer: Medicare Other | Admitting: Internal Medicine

## 2014-03-30 ENCOUNTER — Encounter: Payer: Self-pay | Admitting: Internal Medicine

## 2014-03-30 ENCOUNTER — Other Ambulatory Visit (INDEPENDENT_AMBULATORY_CARE_PROVIDER_SITE_OTHER): Payer: Medicare Other

## 2014-03-30 VITALS — BP 158/82 | HR 73 | Temp 98.0°F | Resp 16 | Ht 65.0 in | Wt 153.0 lb

## 2014-03-30 DIAGNOSIS — K5190 Ulcerative colitis, unspecified, without complications: Secondary | ICD-10-CM

## 2014-03-30 DIAGNOSIS — I10 Essential (primary) hypertension: Secondary | ICD-10-CM | POA: Diagnosis not present

## 2014-03-30 DIAGNOSIS — J4520 Mild intermittent asthma, uncomplicated: Secondary | ICD-10-CM | POA: Diagnosis not present

## 2014-03-30 DIAGNOSIS — E039 Hypothyroidism, unspecified: Secondary | ICD-10-CM

## 2014-03-30 DIAGNOSIS — R197 Diarrhea, unspecified: Secondary | ICD-10-CM | POA: Diagnosis not present

## 2014-03-30 DIAGNOSIS — M489 Spondylopathy, unspecified: Secondary | ICD-10-CM

## 2014-03-30 DIAGNOSIS — K219 Gastro-esophageal reflux disease without esophagitis: Secondary | ICD-10-CM

## 2014-03-30 DIAGNOSIS — M539 Dorsopathy, unspecified: Secondary | ICD-10-CM

## 2014-03-30 DIAGNOSIS — F411 Generalized anxiety disorder: Secondary | ICD-10-CM

## 2014-03-30 DIAGNOSIS — E785 Hyperlipidemia, unspecified: Secondary | ICD-10-CM

## 2014-03-30 LAB — LIPID PANEL
Cholesterol: 259 mg/dL — ABNORMAL HIGH (ref 0–200)
HDL: 47.4 mg/dL (ref >39.00)
LDL Cholesterol: 176 mg/dL — ABNORMAL HIGH (ref 0–99)
NonHDL: 211.6
Total CHOL/HDL Ratio: 5
Triglycerides: 177 mg/dL — ABNORMAL HIGH (ref 0.0–149.0)
VLDL: 35.4 mg/dL (ref 0.0–40.0)

## 2014-03-30 LAB — BASIC METABOLIC PANEL
BUN: 12 mg/dL (ref 6–23)
CO2: 24 mEq/L (ref 19–32)
Calcium: 9.6 mg/dL (ref 8.4–10.5)
Chloride: 104 mEq/L (ref 96–112)
Creatinine, Ser: 0.7 mg/dL (ref 0.4–1.2)
GFR: 84.18 mL/min (ref >60.00)
Glucose, Bld: 91 mg/dL (ref 70–99)
Potassium: 3.2 mEq/L — ABNORMAL LOW (ref 3.5–5.1)
Sodium: 142 mEq/L (ref 135–145)

## 2014-03-30 LAB — TSH: TSH: 2.37 u[IU]/mL (ref 0.35–4.50)

## 2014-03-30 MED ORDER — NAPROXEN 500 MG PO TABS: 500.0000 mg | ORAL_TABLET | Freq: Two times a day (BID) | ORAL | Status: DC

## 2014-03-30 NOTE — Patient Instructions (Signed)
We will check your blood work today and call you with the results. Come back in about 4-5 months to give you time to recover from the surgery.   Call us with problems or questions before then.

## 2014-03-30 NOTE — Assessment & Plan Note (Signed)
She is doing better with remicaid and mesalamine. Follows with GI.

## 2014-03-30 NOTE — Assessment & Plan Note (Signed)
Sees psychiatrist for this and using prozac and xanax about 3 times per week. Advised her that valium is the same kind of medicine as this and it was not safe to use both so she will D/C valium and have removed from medication list.
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
## 2014-03-30 NOTE — Assessment & Plan Note (Signed)
Check TSH today

## 2014-03-30 NOTE — Assessment & Plan Note (Addendum)
Having back surgery in two operations later this month. She will take naproxen for pain. Stopped celebrex, etodolac, ketoprofen, flexeril, tramadol.

## 2014-03-30 NOTE — Assessment & Plan Note (Signed)
Check lipid panel  

## 2014-03-30 NOTE — Assessment & Plan Note (Signed)
Taking protonix with good results.

## 2014-03-30 NOTE — Assessment & Plan Note (Signed)
BP good today off medicines, check BMP.

## 2014-03-30 NOTE — Assessment & Plan Note (Signed)
She uses dulera about once per month and does pretty well. Explained difference in controller and emergency medicine.

## 2014-03-30 NOTE — Assessment & Plan Note (Signed)
Somewhat better with mesalamine and remicaid but still having some episodes of this.

## 2014-04-04 ENCOUNTER — Telehealth: Payer: Self-pay | Admitting: Vascular Surgery
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
## 2014-04-08 ENCOUNTER — Other Ambulatory Visit: Payer: Self-pay

## 2014-04-12 ENCOUNTER — Encounter (HOSPITAL_COMMUNITY): Payer: Self-pay | Admitting: Pharmacy Technician

## 2014-04-12 ENCOUNTER — Inpatient Hospital Stay (HOSPITAL_COMMUNITY)
Admission: RE | Admit: 2014-04-12 | Discharge: 2014-04-12 | Disposition: A | Discharge status: Home / Self Care | Payer: Medicare Other | Source: Ambulatory Visit

## 2014-04-12 NOTE — Pre-Procedure Instructions (Addendum)
[name]  04/12/2014   Your procedure is scheduled on:  Friday, Oct. 30th   Report to [facility] Admitting at  5:30 AM.   Call this number if you have problems the morning of surgery: [phone]   Remember:   Do not eat food or drink liquids after midnight Thursday.   Take these medicines the morning of surgery with A SIP OF WATER: Synthroid, Protonix, Prozac, Xanax.  Please use your inhalers that morning.               Stop taking Naproxen, Advil, Motrin, Ibuprofen, Aspirin 4-5 days prior to surgery.   Do not wear jewelry, make-up or nail polish.  Do not wear lotions, powders, or perfumes. You may NOT wear deodorant the morning of surgery.  Do not shave underarms & legs 48 hours prior to surgery.    Do not bring valuables to the hospital.  [facility] is not responsible for any belongings or valuables.               Contacts, dentures or bridgework may not be worn into surgery.  Leave suitcase in the car. After surgery it may be brought to your room.  For patients admitted to the hospital, discharge time is determined by your treatment team.    Name and phone number of your driver:    Special Instructions: "Preparing for Surgery" instruction sheet.   Please read over the following fact sheets that you were given: Pain Booklet, Coughing and Deep Breathing, Blood Transfusion Information, MRSA Information and Surgical Site Infection Prevention

## 2014-04-14 ENCOUNTER — Other Ambulatory Visit: Payer: Self-pay | Admitting: Internal Medicine

## 2014-04-14 ENCOUNTER — Telehealth: Payer: Self-pay | Admitting: Internal Medicine

## 2014-04-14 NOTE — Telephone Encounter (Signed)
Patient need refill of Phenergan 12.5 mg

## 2014-04-15 ENCOUNTER — Other Ambulatory Visit: Payer: Self-pay | Admitting: Geriatric Medicine

## 2014-04-15 MED ORDER — PROMETHAZINE HCL 12.5 MG PO TABS: 12.5000 mg | ORAL_TABLET | Freq: Four times a day (QID) | ORAL | Status: DC | PRN

## 2014-04-15 NOTE — Telephone Encounter (Signed)
Sent to [facility]

## 2014-04-20 ENCOUNTER — Encounter (HOSPITAL_COMMUNITY)
Admission: RE | Admit: 2014-04-20 | Discharge: 2014-04-20 | Disposition: A | Discharge status: Home / Self Care | Payer: Medicare Other | Source: Ambulatory Visit | Attending: Neurosurgery | Admitting: Neurosurgery

## 2014-04-20 ENCOUNTER — Encounter (HOSPITAL_COMMUNITY): Payer: Self-pay

## 2014-04-20 ENCOUNTER — Telehealth: Payer: Self-pay | Admitting: *Deleted

## 2014-04-20 ENCOUNTER — Encounter: Payer: Self-pay | Admitting: Vascular Surgery

## 2014-04-20 DIAGNOSIS — F411 Generalized anxiety disorder: Secondary | ICD-10-CM | POA: Diagnosis not present

## 2014-04-20 DIAGNOSIS — E039 Hypothyroidism, unspecified: Secondary | ICD-10-CM | POA: Diagnosis present

## 2014-04-20 DIAGNOSIS — K219 Gastro-esophageal reflux disease without esophagitis: Secondary | ICD-10-CM | POA: Diagnosis present

## 2014-04-20 DIAGNOSIS — M47896 Other spondylosis, lumbar region: Secondary | ICD-10-CM | POA: Diagnosis not present

## 2014-04-20 DIAGNOSIS — F909 Attention-deficit hyperactivity disorder, unspecified type: Secondary | ICD-10-CM | POA: Diagnosis not present

## 2014-04-20 DIAGNOSIS — M4856XA Collapsed vertebra, not elsewhere classified, lumbar region, initial encounter for fracture: Secondary | ICD-10-CM | POA: Diagnosis not present

## 2014-04-20 DIAGNOSIS — Z79899 Other long term (current) drug therapy: Secondary | ICD-10-CM | POA: Diagnosis not present

## 2014-04-20 DIAGNOSIS — M5416 Radiculopathy, lumbar region: Secondary | ICD-10-CM | POA: Diagnosis present

## 2014-04-20 DIAGNOSIS — M438X9 Other specified deforming dorsopathies, site unspecified: Secondary | ICD-10-CM | POA: Diagnosis not present

## 2014-04-20 DIAGNOSIS — M4806 Spinal stenosis, lumbar region: Secondary | ICD-10-CM | POA: Diagnosis present

## 2014-04-20 DIAGNOSIS — F329 Major depressive disorder, single episode, unspecified: Secondary | ICD-10-CM | POA: Diagnosis present

## 2014-04-20 DIAGNOSIS — I10 Essential (primary) hypertension: Secondary | ICD-10-CM | POA: Diagnosis not present

## 2014-04-20 DIAGNOSIS — F419 Anxiety disorder, unspecified: Secondary | ICD-10-CM | POA: Diagnosis present

## 2014-04-20 DIAGNOSIS — R260 Ataxic gait: Secondary | ICD-10-CM | POA: Diagnosis not present

## 2014-04-20 DIAGNOSIS — M5116 Intervertebral disc disorders with radiculopathy, lumbar region: Secondary | ICD-10-CM | POA: Diagnosis not present

## 2014-04-20 DIAGNOSIS — Z881 Allergy status to other antibiotic agents status: Secondary | ICD-10-CM | POA: Diagnosis not present

## 2014-04-20 DIAGNOSIS — M8580 Other specified disorders of bone density and structure, unspecified site: Secondary | ICD-10-CM | POA: Diagnosis present

## 2014-04-20 DIAGNOSIS — Z4889 Encounter for other specified surgical aftercare: Secondary | ICD-10-CM | POA: Diagnosis not present

## 2014-04-20 DIAGNOSIS — M479 Spondylosis, unspecified: Secondary | ICD-10-CM | POA: Diagnosis present

## 2014-04-20 DIAGNOSIS — Z981 Arthrodesis status: Secondary | ICD-10-CM | POA: Diagnosis not present

## 2014-04-20 DIAGNOSIS — M419 Scoliosis, unspecified: Secondary | ICD-10-CM | POA: Diagnosis not present

## 2014-04-20 DIAGNOSIS — M4126 Other idiopathic scoliosis, lumbar region: Secondary | ICD-10-CM | POA: Diagnosis not present

## 2014-04-20 DIAGNOSIS — R030 Elevated blood-pressure reading, without diagnosis of hypertension: Secondary | ICD-10-CM | POA: Diagnosis not present

## 2014-04-20 DIAGNOSIS — M5126 Other intervertebral disc displacement, lumbar region: Secondary | ICD-10-CM | POA: Diagnosis not present

## 2014-04-20 DIAGNOSIS — Z96611 Presence of right artificial shoulder joint: Secondary | ICD-10-CM | POA: Diagnosis present

## 2014-04-20 DIAGNOSIS — M4854XA Collapsed vertebra, not elsewhere classified, thoracic region, initial encounter for fracture: Secondary | ICD-10-CM | POA: Diagnosis not present

## 2014-04-20 DIAGNOSIS — M4186 Other forms of scoliosis, lumbar region: Secondary | ICD-10-CM | POA: Diagnosis not present

## 2014-04-20 DIAGNOSIS — Z87891 Personal history of nicotine dependence: Secondary | ICD-10-CM | POA: Diagnosis not present

## 2014-04-20 DIAGNOSIS — M5136 Other intervertebral disc degeneration, lumbar region: Secondary | ICD-10-CM | POA: Diagnosis not present

## 2014-04-20 DIAGNOSIS — R410 Disorientation, unspecified: Secondary | ICD-10-CM | POA: Diagnosis not present

## 2014-04-20 DIAGNOSIS — M6281 Muscle weakness (generalized): Secondary | ICD-10-CM | POA: Diagnosis not present

## 2014-04-20 HISTORY — DX: Personal history of other diseases of the digestive system: Z87.19

## 2014-04-20 LAB — CBC
HCT: 39.6 % (ref 36.0–46.0)
Hemoglobin: 12.8 g/dL (ref 12.0–15.0)
MCH: 28.3 pg (ref 26.0–34.0)
MCHC: 32.3 g/dL (ref 30.0–36.0)
MCV: 87.4 fL (ref 78.0–100.0)
PLATELETS: 277 10*3/uL (ref 150–400)
RBC: 4.53 MIL/uL (ref 3.87–5.11)
RDW: 15.4 % (ref 11.5–15.5)
WBC: 7.1 10*3/uL (ref 4.0–10.5)

## 2014-04-20 LAB — BASIC METABOLIC PANEL
ANION GAP: 13 (ref 5–15)
BUN: 13 mg/dL (ref 6–23)
CO2: 27 meq/L (ref 19–32)
CREATININE: 0.77 mg/dL (ref 0.50–1.10)
Calcium: 9.1 mg/dL (ref 8.4–10.5)
Chloride: 102 mEq/L (ref 96–112)
GFR calc Af Amer: >90 mL/min (ref >90)
GFR, EST NON AFRICAN AMERICAN: 84 mL/min — AB (ref >90)
Glucose, Bld: 92 mg/dL (ref 70–99)
Potassium: 3.2 mEq/L — ABNORMAL LOW (ref 3.7–5.3)
SODIUM: 142 meq/L (ref 137–147)

## 2014-04-20 LAB — SURGICAL PCR SCREEN
MRSA, PCR: NEGATIVE
Staphylococcus aureus: POSITIVE — AB

## 2014-04-20 NOTE — Telephone Encounter (Signed)
Left msg on triage pt is requesting refills on her xanax...[name]

## 2014-04-20 NOTE — Progress Notes (Signed)
Pt notified of positive PCR of staph. She voiced understanding and will pick up Rx in AM.

## 2014-04-20 NOTE — Progress Notes (Signed)
LEFT VOICEMAIL FOR PATIENT TO CALL.     CALLED IN RX FOR MUPIROCIN TO [facility].

## 2014-04-20 NOTE — Progress Notes (Addendum)
LEFT MESSAGE ON PATIENT VOICEMAIL TO PICK UP RX  AND USE AS DIRECTED.

## 2014-04-21 ENCOUNTER — Ambulatory Visit (INDEPENDENT_AMBULATORY_CARE_PROVIDER_SITE_OTHER): Payer: Medicare Other | Admitting: Vascular Surgery

## 2014-04-21 ENCOUNTER — Encounter: Payer: Self-pay | Admitting: Vascular Surgery

## 2014-04-21 VITALS — BP 169/92 | HR 72 | Temp 97.9°F | Resp 16 | Ht 66.0 in | Wt 153.0 lb

## 2014-04-21 DIAGNOSIS — M5137 Other intervertebral disc degeneration, lumbosacral region: Secondary | ICD-10-CM | POA: Diagnosis not present

## 2014-04-21 MED ORDER — VANCOMYCIN HCL IN DEXTROSE 1-5 GM/200ML-% IV SOLN
1000.0000 mg | INTRAVENOUS | Status: DC
  Filled 2014-04-21: qty 200

## 2014-04-21 MED ORDER — VANCOMYCIN HCL IN DEXTROSE 1-5 GM/200ML-% IV SOLN
1000.0000 mg | INTRAVENOUS | Status: AC
  Administered 2014-04-22: 1000 mg via INTRAVENOUS
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
## 2014-04-21 NOTE — Telephone Encounter (Signed)
Sent md response to [facility]...[name]

## 2014-04-21 NOTE — Addendum Note (Signed)
Addended by: Earnstine Regal on: 04/21/2014 01:37 PM   Modules accepted: Orders

## 2014-04-21 NOTE — Assessment & Plan Note (Signed)
The patient appears to be a good candidate for anterior retroperitoneal exposure of L5-S1. I have reviewed our role in exposure of the spine in order to allow anterior lumbar interbody fusion at the appropriate levels. We have discussed the potential complications of surgery, including but not limited to, arterial or venous injury, thrombosis, or bleeding. We have also discussed the potential risks of wound healing problems, the development of a hernia, nerve injury, leg swelling, or other unpredictable medical problems. All the patient's questions were answered and they are agreeable to proceed.

## 2014-04-21 NOTE — H&P (Signed)
[name], [place] [address] Phone: [phone]   Patient ID:   [id number] Patient: [name]  Date of Birth: [birth year] Visit Type: Office Visit   Date: 04/20/2014 12:00 PM Provider: Marchia Meiers. Vertell Limber MD   This 68 year old female presents for Follow Up of back pain.  History of Present Illness: 1.  Follow Up of back pain  Patient comes in today to review specifics of surgical plan and discuss risks and benefits of surgery.  Her exam is unchanged and her pain remains severe.  She wishes to proceed with surgery, which will consist of decompression and fusion L 1 - S 1 levels.  She will have ALIF of L 5 S1 and XLIF of L 12, L 23, L 34, L 45 levels followed by percutaneous pedicle screw fixation.  Long axis spine films were reviewed which show she is 11 degrees out of balance and has significant coronal deformity.  She was fitted for TLSO brace.  Questions were answered and she wishes to proceed.      Medical/Surgical/Interim History Reviewed, no change.  Last detailed document date:01/24/2014.   PAST MEDICAL HISTORY, SURGICAL HISTORY, FAMILY HISTORY, SOCIAL HISTORY AND REVIEW OF SYSTEMS I have reviewed the patient's past medical, surgical, family and social history as well as the comprehensive review of systems as included on the [facility] history form dated 01/24/2014, which I have signed.  Family History: Reviewed, no changes.  Last detailed document: 01/24/2014.   Social History: Tobacco use reviewed. Reviewed, no changes. Last detailed document date: 01/24/2014.      MEDICATIONS(added, continued or stopped this visit):   Started Medication Directions Instruction Stopped   Asacol HD 800 mg tablet,delayed release take 3 tablet by oral route 2 times every day     bupropion HCl XL 300 mg 24 hr tablet, extended release take 1 tablet by oral route  every day     Celebrex 200 mg capsule take 1 capsule by oral  route  every day     Concerta 36 mg tablet,extended release take 1 tablet by oral route  every day in the morning     cyclobenzaprine 10 mg tablet take 1 tablet by oral route 3 times every day as needed for spasms     fluoxetine 40 mg capsule take 1 capsule by oral route  every day     lamotrigine 200 mg tablet take 1 tablet by oral route  every day     pantoprazole 40 mg tablet,delayed release take 1 tablet by oral route 2 times every day     Synthroid 100 mcg tablet take 1 tablet by oral route  every day     zolpidem 10 mg tablet take 1 tablet by oral route  every day at bedtime as needed      ALLERGIES:  Ingredient Reaction Medication Name Comment  AMPICILLIN Nausea    Reviewed, no changes.    Vitals Date Temp F BP Pulse Ht In Wt Lb BMI BSA Pain Score  04/20/2014  189/74 80 66 154 24.86  2/10        IMPRESSION Severe scoliosis L 1 S 1 levels.  Completed Orders (this encounter) Order Details Reason Side Interpretation Result Initial Treatment Date Region  Hypertension education Continue to monitor blood pressure. If remains elevated, contact primary care physician.         Assessment/Plan # Detail Type Description   1. Assessment Acquired scoliosis (M41.9).  2. Assessment Lumbar radiculopathy (M54.16).       3. Assessment Lumbar stenosis (M48.06).       4. Assessment Elevated blood-pressure reading, w/o diagnosis of htn (R03.0).         Pain Assessment/Treatment Pain Scale: 2/10. Method: Numeric Pain Intensity Scale. Location: legs. Onset: 01/25/2011. Duration: varies. Quality: discomforting. Pain Assessment/Treatment follow-up plan of care: Patient is not currently taking any medication for pain..  Fall Risk Plan The patient has not fallen in the last year.  Spinal reconstructive surgery in 2 stages.  Orders: Instruction(s)/Education: Assessment Instruction  R03.0 Hypertension education             Provider:  Marchia Meiers. Vertell Limber MD   04/21/2014 11:25 AM Dictation edited by: Marchia Meiers. Vertell Limber    CC Providers: [place], [place] [id number] ----------------------------------------------------------------------------------------------------------------------------------------------------------------------          [name], [name] [address] Phone: [phone]   Patient ID:   [id number] Patient: [name]  Date of Birth: 05-01-1946 Visit Type: Office Visit   Date: 03/14/2014 01:45 PM Provider: Marchia Meiers. Vertell Limber MD   This 69 year old female presents for Follow Up of back pain.  History of Present Illness: 1.  Follow Up of back pain  Patient has had bone density testing which reveals osteopenia without frank osteoporosis.  In meantime she says she is no better in terms of her pain.  I recommended obtaining AP and lateral scoliosis radiographs at [facility] to assess alignment and sagittal balance.  Patient feels that she is miserable and wants to proceed with surgery.  She has recovered from the standpoint of her hip issues.      Medical/Surgical/Interim History Reviewed, no change.  Last detailed document date:01/24/2014.   Family History: Reviewed, no changes.  Last detailed document: 01/24/2014.   Social History: Tobacco use reviewed. Reviewed, no changes. Last detailed document date: 01/24/2014.      MEDICATIONS(added, continued or stopped this visit):   Started Medication Directions Instruction Stopped   Asacol HD 800 mg tablet,delayed release take 3 tablet by oral route 2 times every day     bupropion HCl XL 300 mg 24 hr tablet, extended release take 1 tablet by oral route  every day     Celebrex 200 mg capsule take 1 capsule by oral route  every day     Concerta 36 mg tablet,extended release take 1 tablet by oral route  every day in the morning     cyclobenzaprine 10 mg tablet take 1 tablet by  oral route 3 times every day as needed for spasms     fluoxetine 40 mg capsule take 1 capsule by oral route  every day     lamotrigine 200 mg tablet take 1 tablet by oral route  every day     pantoprazole 40 mg tablet,delayed release take 1 tablet by oral route 2 times every day     Synthroid 100 mcg tablet take 1 tablet by oral route  every day     zolpidem 10 mg tablet take 1 tablet by oral route  every day at bedtime as needed      ALLERGIES:  Ingredient Reaction Medication Name Comment  AMPICILLIN Nausea    Reviewed, no changes.   Vitals Date Temp F BP Pulse Ht In Wt Lb BMI BSA Pain Score  03/14/2014  180/80 77 66 152 24.53  0/10  IMPRESSION Patient with persistent radiculopathy and scoliosis.  Completed Orders (this encounter) Order Details Reason Side Interpretation Result Initial Treatment Date Region  Hypertension education Continue to monitor blood pressure. If remains elevated, contact primary care physician.         Assessment/Plan # Detail Type Description   1. 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       2. Assessment Lumbar radiculopathy (724.4).       3. Assessment Lumbar scoliosis (737.30).       4. Assessment Lumbar spinal stenosis (724.02).       5. Assessment Abnormal findings, elevated BP w/o HTN (796.2).         Pain Assessment/Treatment Pain Scale: 0/10. Method: Numeric Pain Intensity Scale. Onset: 01/25/2011.  Return with scoliosis radiographs and we will discuss the specifics of surgical intervention.  Orders: Diagnostic Procedures: Assessment Procedure  737.30 Return to Clinic after study is performed  737.30 Scoliosis- AP/Lat  Instruction(s)/Education: Assessment Instruction  796.2 Hypertension education             Provider:  Marchia Meiers. Vertell Limber MD  03/20/2014 05:57 PM Dictation edited by: Marchia Meiers. Vertell Limber    CC Providers: [place] [place], [place]  [id number]- ----------------------------------------------------------------------------------------------------------------------------------------------------------------------         Electronically signed by Marchia Meiers Vertell Limber MD on 03/20/2014 05:58 PM   [address] [place], [place] [address] Phone: [phone]   Patient ID:   [id number] Patient: [name]  Date of Birth: [birth year] Visit Type: Office Visit   Date: 01/24/2014 12:00 PM Provider: Marchia Meiers. Vertell Limber MD   This 68 year old female presents for Back pain.  History of Present Illness: 1.  Back pain  Natonya Finstad returns reporting lumbar pain & RLE weakness worse since falling on wet stairs May 10th. Uses cane for ambulation (right shoulder repair 2yr ago)  PT ended last week - max benefit.  [facility].  Flexeril 114m1/day Celebrex 20020m/day  Patient complains of right leg weakness.  She says she fell on her left leg on 10/31/13.  She complains of numbness into her first and second toes on the right.  She says she slipped on her stairs when she fell.  She comes today with her husband to review her situation and to discuss treatment options.  She is currently [age]ars old.  On examination the patient is quite miserable today.  She complains of numbness in her first and second toes on the right.  She has focal weakness in L5 distribution on the right involving EHL.  She has a positive seated straight leg raise on the right.  She has a great deal of difficulty standing and bearing full weight on her right leg because of significant discomfort.        PAST MEDICAL/SURGICAL HISTORY   (Detailed)      PAST MEDICAL HISTORY, SURGICAL HISTORY, FAMILY HISTORY, SOCIAL HISTORY AND REVIEW OF SYSTEMS I have reviewed the patient's past medical, surgical, family and social history as well as the comprehensive review of systems as included on the  [facility] history form dated 01/24/2014, which I have signed.  Family History  (Detailed)  SOCIAL HISTORY  (Detailed) Tobacco use reviewed. Preferred language is Unknown.   Smoking status: Former smoker.  SMOKING STATUS Use Status Type Smoking Status Usage Per Day Years Used Total Pack Years  yes  Former smoker  HOME ENVIRONMENT/SAFETY  The Patient has fallen 1 times in the last year.  The fall(s) resulted in injury.  Details: left hip.       MEDICATIONS(added, continued or stopped this visit):   Started Medication Directions Instruction Stopped   Asacol HD 800 mg tablet,delayed release take 3 tablet by oral route 2 times every day     bupropion HCl XL 300 mg 24 hr tablet, extended release take 1 tablet by oral route  every day     Celebrex 200 mg capsule take 1 capsule by oral route  every day     Concerta 36 mg tablet,extended release take 1 tablet by oral route  every day in the morning     cyclobenzaprine 10 mg tablet take 1 tablet by oral route 3 times every day as needed for spasms     fluoxetine 20 mg capsule take 3 capsules by oral route  every day  01/25/2014   fluoxetine 40 mg capsule take 1 capsule by oral route  every day     lamotrigine 200 mg tablet take 1 tablet by oral route  every day     pantoprazole 40 mg tablet,delayed release take 1 tablet by oral route 2 times every day     Synthroid 100 mcg tablet take 1 tablet by oral route  every day     zolpidem 10 mg tablet take 1 tablet by oral route  every day at bedtime as needed      ALLERGIES:  Ingredient Reaction Medication Name Comment  AMPICILLIN Nausea    Reviewed, updated.   Vitals Date Temp F BP Pulse Ht In Wt Lb BMI BSA Pain Score  01/24/2014  146/80 86 66 153 24.69  3/10      DIAGNOSTIC RESULTS I reviewed lumbar radiographs in the office today which show multilevel spondylolisthesis and severe lumbar scoliosis leave O convex at the lower lumbar spine.   Multilevel degenerative changes throughout the lumbar spine.  In addition an MRI of her lumbar spine from 12/31/13 which was performed at an outside institution shows severe nerve root and canal stenosis secondary to scoliosis and disc degeneration, particularly severe on the right at the L5-S1 level, causing foraminal nerve root compression of the L5 nerve root on the right.  There are also significant degenerative changes at the L4 L5 level on the right as well.    IMPRESSION The patient has a significant lumbar scoliotic curvature with nerve root compression.  She says she cannot function in her current state and wants to have this repaired.  I explained that this would be and involved surgery given the severity of her degenerative changes.  I have recommended that she have a bone density test performed and if this shows that her bones are strong enough then we will proceed with surgical intervention.  Completed Orders (this encounter) Order Details Reason Side Interpretation Result Initial Treatment Date Region  Lumbar Spine- AP/Lat/Flex/Ex      01/24/2014 All Levels to All Levels   Assessment/Plan # Detail Type Description   1. Assessment Lumbar scoliosis (737.30).       2. Assessment Lumbar spinal stenosis (724.02).       3. 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       4. Assessment Lumbar radiculopathy (724.4).         Pain Assessment/Treatment Pain Scale: 3/10. Method: Numeric Pain Intensity Scale. Location: legs. Onset: 01/25/2011. Duration: varies. Quality: aching. Pain Assessment/Treatment follow-up plan of care: Patient not taking  any pain medication..  Fall Risk Plan The Patient has fallen 1 times in the last year. The fall(s) resulted in injury. Details: left hip. Falls risk follow-up plan of care: Assisted devices: Advised patient to use walking cane..  Patient will return with bone density test results to discuss further treatment options.  She  has already had injections and has had limited benefit from these.  She is not anxious to repeat these.  She has also completed physical therapy and does not feel they would have much more to offer her at this point.  Orders: Office Procedures/Services: Assessment Service Comments   Bone density test    Diagnostic Procedures: Assessment Procedure   Lumbar Spine- AP/Lat/Flex/Ex             Provider:  Marchia Meiers. Vertell Limber MD  01/30/2014 05:47 PM Dictation edited by: Marchia Meiers. Vertell Limber    CC Providers: [place], [place] [id number]- ----------------------------------------------------------------------------------------------------------------------------------------------------------------------         Electronically signed by Marchia Meiers Vertell Limber MD on 01/30/2014 05:48 PM

## 2014-04-21 NOTE — Telephone Encounter (Signed)
She sees psych for this and will not refill.

## 2014-04-22 ENCOUNTER — Inpatient Hospital Stay (HOSPITAL_COMMUNITY)
Admission: RE | Admit: 2014-04-22 | Discharge: 2014-04-29 | DRG: 458 | Disposition: A | Discharge status: Skilled Nursing Facility | Payer: Medicare Other | Source: Ambulatory Visit | Attending: Neurosurgery | Admitting: Neurosurgery

## 2014-04-22 ENCOUNTER — Encounter (HOSPITAL_COMMUNITY): Payer: Medicare Other | Admitting: Anesthesiology

## 2014-04-22 ENCOUNTER — Inpatient Hospital Stay (HOSPITAL_COMMUNITY): Payer: Medicare Other

## 2014-04-22 ENCOUNTER — Inpatient Hospital Stay (HOSPITAL_COMMUNITY): Payer: Medicare Other | Admitting: Anesthesiology

## 2014-04-22 ENCOUNTER — Encounter (HOSPITAL_COMMUNITY)
Admission: RE | Disposition: A | Discharge status: Skilled Nursing Facility | Payer: Self-pay | Source: Ambulatory Visit | Attending: Neurosurgery

## 2014-04-22 ENCOUNTER — Encounter (HOSPITAL_COMMUNITY): Payer: Self-pay | Admitting: *Deleted

## 2014-04-22 DIAGNOSIS — M4186 Other forms of scoliosis, lumbar region: Secondary | ICD-10-CM | POA: Diagnosis not present

## 2014-04-22 DIAGNOSIS — K219 Gastro-esophageal reflux disease without esophagitis: Secondary | ICD-10-CM | POA: Diagnosis present

## 2014-04-22 DIAGNOSIS — F909 Attention-deficit hyperactivity disorder, unspecified type: Secondary | ICD-10-CM | POA: Diagnosis not present

## 2014-04-22 DIAGNOSIS — Z4889 Encounter for other specified surgical aftercare: Secondary | ICD-10-CM | POA: Diagnosis not present

## 2014-04-22 DIAGNOSIS — E039 Hypothyroidism, unspecified: Secondary | ICD-10-CM | POA: Diagnosis not present

## 2014-04-22 DIAGNOSIS — M6281 Muscle weakness (generalized): Secondary | ICD-10-CM | POA: Diagnosis not present

## 2014-04-22 DIAGNOSIS — Z96611 Presence of right artificial shoulder joint: Secondary | ICD-10-CM | POA: Diagnosis present

## 2014-04-22 DIAGNOSIS — R260 Ataxic gait: Secondary | ICD-10-CM | POA: Diagnosis not present

## 2014-04-22 DIAGNOSIS — R410 Disorientation, unspecified: Secondary | ICD-10-CM | POA: Diagnosis not present

## 2014-04-22 DIAGNOSIS — M419 Scoliosis, unspecified: Secondary | ICD-10-CM | POA: Diagnosis not present

## 2014-04-22 DIAGNOSIS — I10 Essential (primary) hypertension: Secondary | ICD-10-CM | POA: Diagnosis not present

## 2014-04-22 DIAGNOSIS — M5136 Other intervertebral disc degeneration, lumbar region: Secondary | ICD-10-CM

## 2014-04-22 DIAGNOSIS — F419 Anxiety disorder, unspecified: Secondary | ICD-10-CM | POA: Diagnosis present

## 2014-04-22 DIAGNOSIS — Z881 Allergy status to other antibiotic agents status: Secondary | ICD-10-CM

## 2014-04-22 DIAGNOSIS — M4806 Spinal stenosis, lumbar region: Secondary | ICD-10-CM | POA: Diagnosis present

## 2014-04-22 DIAGNOSIS — Z87891 Personal history of nicotine dependence: Secondary | ICD-10-CM

## 2014-04-22 DIAGNOSIS — Z79899 Other long term (current) drug therapy: Secondary | ICD-10-CM

## 2014-04-22 DIAGNOSIS — M4856XA Collapsed vertebra, not elsewhere classified, lumbar region, initial encounter for fracture: Secondary | ICD-10-CM | POA: Diagnosis not present

## 2014-04-22 DIAGNOSIS — M5416 Radiculopathy, lumbar region: Secondary | ICD-10-CM | POA: Diagnosis present

## 2014-04-22 DIAGNOSIS — F329 Major depressive disorder, single episode, unspecified: Secondary | ICD-10-CM | POA: Diagnosis present

## 2014-04-22 DIAGNOSIS — M47896 Other spondylosis, lumbar region: Secondary | ICD-10-CM | POA: Diagnosis not present

## 2014-04-22 DIAGNOSIS — M4126 Other idiopathic scoliosis, lumbar region: Secondary | ICD-10-CM | POA: Diagnosis not present

## 2014-04-22 DIAGNOSIS — M5126 Other intervertebral disc displacement, lumbar region: Secondary | ICD-10-CM | POA: Diagnosis not present

## 2014-04-22 DIAGNOSIS — Z981 Arthrodesis status: Secondary | ICD-10-CM | POA: Diagnosis not present

## 2014-04-22 DIAGNOSIS — F411 Generalized anxiety disorder: Secondary | ICD-10-CM | POA: Diagnosis not present

## 2014-04-22 DIAGNOSIS — M48061 Spinal stenosis, lumbar region without neurogenic claudication: Secondary | ICD-10-CM

## 2014-04-22 DIAGNOSIS — M479 Spondylosis, unspecified: Secondary | ICD-10-CM | POA: Diagnosis present

## 2014-04-22 DIAGNOSIS — M5116 Intervertebral disc disorders with radiculopathy, lumbar region: Secondary | ICD-10-CM | POA: Diagnosis not present

## 2014-04-22 DIAGNOSIS — T819XXA Unspecified complication of procedure, initial encounter: Secondary | ICD-10-CM

## 2014-04-22 DIAGNOSIS — M8580 Other specified disorders of bone density and structure, unspecified site: Secondary | ICD-10-CM | POA: Diagnosis present

## 2014-04-22 DIAGNOSIS — M4854XA Collapsed vertebra, not elsewhere classified, thoracic region, initial encounter for fracture: Secondary | ICD-10-CM | POA: Diagnosis not present

## 2014-04-22 DIAGNOSIS — M438X9 Other specified deforming dorsopathies, site unspecified: Secondary | ICD-10-CM | POA: Diagnosis not present

## 2014-04-22 HISTORY — PX: ANTERIOR LATERAL LUMBAR FUSION 4 LEVELS: SHX5552

## 2014-04-22 HISTORY — PX: ABDOMINAL EXPOSURE: SHX5708

## 2014-04-22 HISTORY — PX: ANTERIOR LUMBAR FUSION: SHX1170

## 2014-04-22 LAB — TYPE AND SCREEN
ABO/RH(D): A POS
ABO/RH(D): A POS
Antibody Screen: NEGATIVE
Antibody Screen: NEGATIVE

## 2014-04-22 SURGERY — ANTERIOR LUMBAR FUSION 1 LEVEL
Anesthesia: General

## 2014-04-22 MED ORDER — GLYCOPYRROLATE 0.2 MG/ML IJ SOLN
INTRAMUSCULAR | Status: DC | PRN
  Administered 2014-04-22: 0.4 mg via INTRAVENOUS

## 2014-04-22 MED ORDER — EPHEDRINE SULFATE 50 MG/ML IJ SOLN
INTRAMUSCULAR | Status: DC | PRN
  Administered 2014-04-22: 10 mg via INTRAVENOUS

## 2014-04-22 MED ORDER — FENTANYL CITRATE 0.05 MG/ML IJ SOLN
INTRAMUSCULAR | Status: AC
  Filled 2014-04-22: qty 5

## 2014-04-22 MED ORDER — MIDAZOLAM HCL 2 MG/2ML IJ SOLN
INTRAMUSCULAR | Status: AC
  Filled 2014-04-22: qty 2

## 2014-04-22 MED ORDER — ACETAMINOPHEN 325 MG PO TABS
650.0000 mg | ORAL_TABLET | ORAL | Status: DC | PRN
  Administered 2014-04-29: 650 mg via ORAL
  Filled 2014-04-22: qty 2

## 2014-04-22 MED ORDER — GLYCOPYRROLATE 0.2 MG/ML IJ SOLN
INTRAMUSCULAR | Status: AC
  Filled 2014-04-22: qty 3

## 2014-04-22 MED ORDER — ARTIFICIAL TEARS OP OINT
TOPICAL_OINTMENT | OPHTHALMIC | Status: AC
  Filled 2014-04-22: qty 3.5

## 2014-04-22 MED ORDER — LAMOTRIGINE 100 MG PO TABS
200.0000 mg | ORAL_TABLET | Freq: Every day | ORAL | Status: DC
  Administered 2014-04-22 – 2014-04-29 (×8): 200 mg via ORAL
  Filled 2014-04-22 (×8): qty 2

## 2014-04-22 MED ORDER — SODIUM CHLORIDE 0.9 % IJ SOLN
3.0000 mL | Freq: Two times a day (BID) | INTRAMUSCULAR | Status: DC
  Administered 2014-04-23 – 2014-04-29 (×8): 3 mL via INTRAVENOUS

## 2014-04-22 MED ORDER — FLEET ENEMA 7-19 GM/118ML RE ENEM: 1.0000 | ENEMA | Freq: Once | RECTAL | Status: AC | PRN

## 2014-04-22 MED ORDER — ROCURONIUM BROMIDE 50 MG/5ML IV SOLN
INTRAVENOUS | Status: AC
  Filled 2014-04-22: qty 1

## 2014-04-22 MED ORDER — VECURONIUM BROMIDE 10 MG IV SOLR
INTRAVENOUS | Status: AC
  Filled 2014-04-22: qty 10

## 2014-04-22 MED ORDER — CHLORHEXIDINE GLUCONATE 4 % EX LIQD: 60.0000 mL | Freq: Once | CUTANEOUS | Status: DC

## 2014-04-22 MED ORDER — HYDROCODONE-ACETAMINOPHEN 5-325 MG PO TABS
1.0000 | ORAL_TABLET | ORAL | Status: DC | PRN
  Administered 2014-04-22 – 2014-04-23 (×4): 2 via ORAL
  Administered 2014-04-24: 1 via ORAL
  Filled 2014-04-22 (×2): qty 2
  Filled 2014-04-22: qty 1
  Filled 2014-04-22 (×2): qty 2

## 2014-04-22 MED ORDER — ALBUMIN HUMAN 5 % IV SOLN
INTRAVENOUS | Status: DC | PRN
  Administered 2014-04-22 (×2): via INTRAVENOUS

## 2014-04-22 MED ORDER — PANTOPRAZOLE SODIUM 40 MG IV SOLR
40.0000 mg | Freq: Every day | INTRAVENOUS | Status: DC
  Administered 2014-04-22: 40 mg via INTRAVENOUS
  Filled 2014-04-22: qty 40

## 2014-04-22 MED ORDER — DIPHENHYDRAMINE HCL 50 MG/ML IJ SOLN
INTRAMUSCULAR | Status: DC | PRN
  Administered 2014-04-22: 12.5 mg via INTRAVENOUS

## 2014-04-22 MED ORDER — HYDROMORPHONE HCL 1 MG/ML IJ SOLN
0.5000 mg | INTRAMUSCULAR | Status: DC | PRN
  Administered 2014-04-23 – 2014-04-26 (×7): 1 mg via INTRAVENOUS
  Filled 2014-04-22 (×9): qty 1

## 2014-04-22 MED ORDER — ALPRAZOLAM 0.5 MG PO TABS
0.5000 mg | ORAL_TABLET | Freq: Every evening | ORAL | Status: DC | PRN
  Administered 2014-04-24 – 2014-04-26 (×3): 0.5 mg via ORAL
  Filled 2014-04-22 (×3): qty 1

## 2014-04-22 MED ORDER — DIAZEPAM 5 MG PO TABS: 5.0000 mg | ORAL_TABLET | Freq: Four times a day (QID) | ORAL | Status: DC | PRN

## 2014-04-22 MED ORDER — PROMETHAZINE HCL 25 MG/ML IJ SOLN: 6.2500 mg | INTRAMUSCULAR | Status: DC | PRN

## 2014-04-22 MED ORDER — PROMETHAZINE HCL 25 MG PO TABS: 12.5000 mg | ORAL_TABLET | Freq: Four times a day (QID) | ORAL | Status: DC | PRN

## 2014-04-22 MED ORDER — SODIUM CHLORIDE 0.9 % IJ SOLN
INTRAMUSCULAR | Status: AC
Start: 2014-04-22 — End: 2014-04-22
  Filled 2014-04-22: qty 10

## 2014-04-22 MED ORDER — SCOPOLAMINE 1 MG/3DAYS TD PT72
1.0000 | MEDICATED_PATCH | TRANSDERMAL | Status: DC
  Administered 2014-04-22: 1 via TRANSDERMAL

## 2014-04-22 MED ORDER — SURGIFOAM 100 EX MISC
CUTANEOUS | Status: DC | PRN
  Administered 2014-04-22: 09:00:00 via TOPICAL

## 2014-04-22 MED ORDER — METHYLPHENIDATE HCL ER 10 MG PO TBCR: 27.0000 mg | EXTENDED_RELEASE_TABLET | Freq: Every day | ORAL | Status: DC

## 2014-04-22 MED ORDER — CALCIUM CARBONATE-VITAMIN D 500-200 MG-UNIT PO TABS
1.0000 | ORAL_TABLET | Freq: Every day | ORAL | Status: DC
  Administered 2014-04-23 – 2014-04-29 (×6): 1 via ORAL
  Filled 2014-04-22 (×6): qty 1

## 2014-04-22 MED ORDER — FLUOXETINE HCL 20 MG PO CAPS
60.0000 mg | ORAL_CAPSULE | Freq: Every day | ORAL | Status: DC
  Administered 2014-04-23 – 2014-04-29 (×7): 60 mg via ORAL
  Filled 2014-04-22 (×7): qty 3

## 2014-04-22 MED ORDER — VECURONIUM BROMIDE 10 MG IV SOLR
INTRAVENOUS | Status: DC | PRN
  Administered 2014-04-22 (×2): 2 mg via INTRAVENOUS

## 2014-04-22 MED ORDER — CHLORPHENIRAMINE MALEATE 4 MG PO TABS
4.0000 mg | ORAL_TABLET | ORAL | Status: DC | PRN
  Filled 2014-04-22: qty 1

## 2014-04-22 MED ORDER — DOCUSATE SODIUM 100 MG PO CAPS
100.0000 mg | ORAL_CAPSULE | Freq: Two times a day (BID) | ORAL | Status: DC
  Administered 2014-04-22 – 2014-04-28 (×10): 100 mg via ORAL
  Filled 2014-04-22 (×11): qty 1

## 2014-04-22 MED ORDER — BUPIVACAINE HCL (PF) 0.5 % IJ SOLN
INTRAMUSCULAR | Status: DC | PRN
  Administered 2014-04-22: 5 mL

## 2014-04-22 MED ORDER — VITAMIN B-12 100 MCG PO TABS
100.0000 ug | ORAL_TABLET | Freq: Every day | ORAL | Status: DC
  Administered 2014-04-23 – 2014-04-29 (×7): 100 ug via ORAL
  Filled 2014-04-22 (×7): qty 1

## 2014-04-22 MED ORDER — PROPOFOL 10 MG/ML IV EMUL
INTRAVENOUS | Status: AC
  Filled 2014-04-22: qty 100

## 2014-04-22 MED ORDER — MESALAMINE 400 MG PO CPDR
2400.0000 mg | DELAYED_RELEASE_CAPSULE | Freq: Two times a day (BID) | ORAL | Status: DC
  Administered 2014-04-22 – 2014-04-27 (×10): 2400 mg via ORAL
  Administered 2014-04-28: 1200 mg via ORAL
  Filled 2014-04-22 (×15): qty 6

## 2014-04-22 MED ORDER — ALUM & MAG HYDROXIDE-SIMETH 200-200-20 MG/5ML PO SUSP: 30.0000 mL | Freq: Four times a day (QID) | ORAL | Status: DC | PRN

## 2014-04-22 MED ORDER — BUPROPION HCL ER (XL) 150 MG PO TB24
300.0000 mg | ORAL_TABLET | Freq: Every day | ORAL | Status: DC
  Administered 2014-04-23 – 2014-04-29 (×7): 300 mg via ORAL
  Filled 2014-04-22 (×7): qty 2

## 2014-04-22 MED ORDER — ROCURONIUM BROMIDE 100 MG/10ML IV SOLN
INTRAVENOUS | Status: DC | PRN
  Administered 2014-04-22: 40 mg via INTRAVENOUS

## 2014-04-22 MED ORDER — LACTATED RINGERS IV SOLN
INTRAVENOUS | Status: DC | PRN
  Administered 2014-04-22 (×5): via INTRAVENOUS

## 2014-04-22 MED ORDER — ONDANSETRON HCL 4 MG/2ML IJ SOLN
INTRAMUSCULAR | Status: DC | PRN
  Administered 2014-04-22: 4 mg via INTRAVENOUS

## 2014-04-22 MED ORDER — PROPOFOL 10 MG/ML IV BOLUS
INTRAVENOUS | Status: AC
  Filled 2014-04-22: qty 20

## 2014-04-22 MED ORDER — MENTHOL 3 MG MT LOZG: 1.0000 | LOZENGE | OROMUCOSAL | Status: DC | PRN

## 2014-04-22 MED ORDER — SENNA 8.6 MG PO TABS
1.0000 | ORAL_TABLET | Freq: Two times a day (BID) | ORAL | Status: DC
  Administered 2014-04-22 – 2014-04-28 (×9): 8.6 mg via ORAL
  Filled 2014-04-22 (×11): qty 1

## 2014-04-22 MED ORDER — ALBUTEROL SULFATE (2.5 MG/3ML) 0.083% IN NEBU: 3.0000 mL | INHALATION_SOLUTION | Freq: Four times a day (QID) | RESPIRATORY_TRACT | Status: DC | PRN

## 2014-04-22 MED ORDER — BISACODYL 10 MG RE SUPP: 10.0000 mg | Freq: Every day | RECTAL | Status: DC | PRN

## 2014-04-22 MED ORDER — POLYETHYLENE GLYCOL 3350 17 G PO PACK: 17.0000 g | PACK | Freq: Every day | ORAL | Status: DC | PRN

## 2014-04-22 MED ORDER — PANTOPRAZOLE SODIUM 40 MG PO TBEC: 40.0000 mg | DELAYED_RELEASE_TABLET | Freq: Two times a day (BID) | ORAL | Status: DC

## 2014-04-22 MED ORDER — DEXAMETHASONE SODIUM PHOSPHATE 10 MG/ML IJ SOLN
INTRAMUSCULAR | Status: DC | PRN
  Administered 2014-04-22: 10 mg via INTRAVENOUS

## 2014-04-22 MED ORDER — CALCIUM CITRATE-VITAMIN D 315-200 MG-UNIT PO TABS: 1.0000 | ORAL_TABLET | Freq: Two times a day (BID) | ORAL | Status: DC

## 2014-04-22 MED ORDER — FENTANYL CITRATE 0.05 MG/ML IJ SOLN
INTRAMUSCULAR | Status: DC | PRN
  Administered 2014-04-22 (×6): 50 ug via INTRAVENOUS
  Administered 2014-04-22: 100 ug via INTRAVENOUS
  Administered 2014-04-22 (×5): 50 ug via INTRAVENOUS

## 2014-04-22 MED ORDER — SODIUM CHLORIDE 0.9 % IV SOLN: 250.0000 mL | INTRAVENOUS | Status: DC

## 2014-04-22 MED ORDER — KCL IN DEXTROSE-NACL 20-5-0.45 MEQ/L-%-% IV SOLN
INTRAVENOUS | Status: DC
  Administered 2014-04-22 – 2014-04-23 (×2): via INTRAVENOUS
  Filled 2014-04-22 (×4): qty 1000

## 2014-04-22 MED ORDER — NEOSTIGMINE METHYLSULFATE 10 MG/10ML IV SOLN
INTRAVENOUS | Status: DC | PRN
  Administered 2014-04-22: 3 mg via INTRAVENOUS

## 2014-04-22 MED ORDER — ONDANSETRON HCL 4 MG/2ML IJ SOLN
INTRAMUSCULAR | Status: AC
  Filled 2014-04-22: qty 2

## 2014-04-22 MED ORDER — OXYCODONE-ACETAMINOPHEN 5-325 MG PO TABS
1.0000 | ORAL_TABLET | ORAL | Status: DC | PRN
  Administered 2014-04-24 – 2014-04-29 (×2): 2 via ORAL
  Filled 2014-04-22 (×2): qty 2

## 2014-04-22 MED ORDER — HYDROMORPHONE HCL 1 MG/ML IJ SOLN: 0.2500 mg | INTRAMUSCULAR | Status: DC | PRN

## 2014-04-22 MED ORDER — LIDOCAINE-EPINEPHRINE 1 %-1:100000 IJ SOLN
INTRAMUSCULAR | Status: DC | PRN
  Administered 2014-04-22: 5 mL

## 2014-04-22 MED ORDER — LIDOCAINE HCL (CARDIAC) 20 MG/ML IV SOLN
INTRAVENOUS | Status: AC
  Filled 2014-04-22: qty 5

## 2014-04-22 MED ORDER — ZOLPIDEM TARTRATE 5 MG PO TABS
5.0000 mg | ORAL_TABLET | Freq: Every evening | ORAL | Status: DC | PRN
Start: 2014-04-22 — End: 2014-04-29

## 2014-04-22 MED ORDER — NEOSTIGMINE METHYLSULFATE 10 MG/10ML IV SOLN
INTRAVENOUS | Status: AC
  Filled 2014-04-22: qty 1

## 2014-04-22 MED ORDER — DEXAMETHASONE SODIUM PHOSPHATE 10 MG/ML IJ SOLN
INTRAMUSCULAR | Status: AC
  Filled 2014-04-22: qty 1

## 2014-04-22 MED ORDER — PHENOL 1.4 % MT LIQD: 1.0000 | OROMUCOSAL | Status: DC | PRN

## 2014-04-22 MED ORDER — ACETAMINOPHEN 650 MG RE SUPP
650.0000 mg | RECTAL | Status: DC | PRN
Start: 2014-04-22 — End: 2014-04-29

## 2014-04-22 MED ORDER — PHENYLEPHRINE HCL 10 MG/ML IJ SOLN
10.0000 mg | INTRAVENOUS | Status: DC | PRN
  Administered 2014-04-22: 10 ug/min via INTRAVENOUS

## 2014-04-22 MED ORDER — VITAMIN D 1000 UNITS PO TABS
1000.0000 [IU] | ORAL_TABLET | Freq: Every day | ORAL | Status: DC
  Administered 2014-04-23 – 2014-04-29 (×7): 1000 [IU] via ORAL
  Filled 2014-04-22 (×7): qty 1

## 2014-04-22 MED ORDER — SODIUM CHLORIDE 0.9 % IJ SOLN
3.0000 mL | INTRAMUSCULAR | Status: DC | PRN
  Administered 2014-04-24: 3 mL via INTRAVENOUS
  Filled 2014-04-22: qty 3

## 2014-04-22 MED ORDER — SUCCINYLCHOLINE CHLORIDE 20 MG/ML IJ SOLN
INTRAMUSCULAR | Status: AC
  Filled 2014-04-22: qty 1

## 2014-04-22 MED ORDER — SCOPOLAMINE 1 MG/3DAYS TD PT72
MEDICATED_PATCH | TRANSDERMAL | Status: AC
  Filled 2014-04-22: qty 1

## 2014-04-22 MED ORDER — LEVOTHYROXINE SODIUM 100 MCG PO TABS
100.0000 ug | ORAL_TABLET | Freq: Every day | ORAL | Status: DC
  Administered 2014-04-23 – 2014-04-29 (×6): 100 ug via ORAL
  Filled 2014-04-22 (×7): qty 1

## 2014-04-22 MED ORDER — ONDANSETRON HCL 4 MG/2ML IJ SOLN: 4.0000 mg | INTRAMUSCULAR | Status: DC | PRN

## 2014-04-22 MED ORDER — EPHEDRINE SULFATE 50 MG/ML IJ SOLN
INTRAMUSCULAR | Status: AC
  Filled 2014-04-22: qty 1

## 2014-04-22 MED ORDER — DIPHENHYDRAMINE HCL 50 MG/ML IJ SOLN
INTRAMUSCULAR | Status: AC
  Filled 2014-04-22: qty 1

## 2014-04-22 MED ORDER — ARTIFICIAL TEARS OP OINT
TOPICAL_OINTMENT | OPHTHALMIC | Status: DC | PRN
  Administered 2014-04-22: 1 via OPHTHALMIC

## 2014-04-22 MED ORDER — 0.9 % SODIUM CHLORIDE (POUR BTL) OPTIME
TOPICAL | Status: DC | PRN
  Administered 2014-04-22: 1000 mL

## 2014-04-22 MED ORDER — MIDAZOLAM HCL 5 MG/5ML IJ SOLN
INTRAMUSCULAR | Status: DC | PRN
  Administered 2014-04-22: 2 mg via INTRAVENOUS

## 2014-04-22 MED ORDER — IBUPROFEN 800 MG PO TABS: 800.0000 mg | ORAL_TABLET | Freq: Every day | ORAL | Status: DC | PRN

## 2014-04-22 MED ORDER — PROPOFOL 10 MG/ML IV BOLUS
INTRAVENOUS | Status: DC | PRN
  Administered 2014-04-22: 100 mg via INTRAVENOUS

## 2014-04-22 SURGICAL SUPPLY — 113 items
APPLIER CLIP 11 MED OPEN (CLIP) ×8
BLADE CLIPPER SURG (BLADE) IMPLANT
BUR BARREL STRAIGHT FLUTE 4.0 (BURR) IMPLANT
CANISTER SUCT 3000ML (MISCELLANEOUS) ×4 IMPLANT
CLIP APPLIE 11 MED OPEN (CLIP) ×4 IMPLANT
CONT SPEC 4OZ CLIKSEAL STRL BL (MISCELLANEOUS) ×4 IMPLANT
COROENT XL-W 8X22X50 (Orthopedic Implant) ×4 IMPLANT
COROENT XL-W 8X22X55-10 (Orthopedic Implant) ×4 IMPLANT
COROENT XLW 10X22X45 (Cage) ×4 IMPLANT
COVER BACK TABLE 24X17X13 BIG (DRAPES) IMPLANT
COVER BACK TABLE 60X90IN (DRAPES) ×4 IMPLANT
DECANTER SPIKE VIAL GLASS SM (MISCELLANEOUS) ×8 IMPLANT
DERMABOND ADHESIVE PROPEN (GAUZE/BANDAGES/DRESSINGS) ×4
DERMABOND ADVANCED .7 DNX6 (GAUZE/BANDAGES/DRESSINGS) ×4 IMPLANT
DRAPE C-ARM 42X72 X-RAY (DRAPES) ×12 IMPLANT
DRAPE C-ARMOR (DRAPES) ×4 IMPLANT
DRAPE INCISE IOBAN 66X45 STRL (DRAPES) ×4 IMPLANT
DRAPE LAPAROTOMY 100X72X124 (DRAPES) ×8 IMPLANT
DRAPE POUCH INSTRU U-SHP 10X18 (DRAPES) ×8 IMPLANT
DRSG OPSITE POSTOP 3X4 (GAUZE/BANDAGES/DRESSINGS) ×4 IMPLANT
DRSG OPSITE POSTOP 4X6 (GAUZE/BANDAGES/DRESSINGS) ×8 IMPLANT
DRSG TELFA 3X8 NADH (GAUZE/BANDAGES/DRESSINGS) IMPLANT
DURAPREP 26ML APPLICATOR (WOUND CARE) ×8 IMPLANT
ELECT BLADE 4.0 EZ CLEAN MEGAD (MISCELLANEOUS) ×4
ELECT REM PT RETURN 9FT ADLT (ELECTROSURGICAL) ×8
ELECTRODE BLDE 4.0 EZ CLN MEGD (MISCELLANEOUS) ×2 IMPLANT
ELECTRODE REM PT RTRN 9FT ADLT (ELECTROSURGICAL) ×4 IMPLANT
FELT TEFLON 6X6 (MISCELLANEOUS) IMPLANT
GAUZE SPONGE 4X4 12PLY STRL (GAUZE/BANDAGES/DRESSINGS) IMPLANT
GAUZE SPONGE 4X4 16PLY XRAY LF (GAUZE/BANDAGES/DRESSINGS) IMPLANT
GLOVE BIO SURGEON STRL SZ8 (GLOVE) ×20 IMPLANT
GLOVE BIOGEL PI IND STRL 7.5 (GLOVE) ×2 IMPLANT
GLOVE BIOGEL PI IND STRL 8 (GLOVE) ×10 IMPLANT
GLOVE BIOGEL PI IND STRL 8.5 (GLOVE) ×12 IMPLANT
GLOVE BIOGEL PI INDICATOR 7.5 (GLOVE) ×2
GLOVE BIOGEL PI INDICATOR 8 (GLOVE) ×10
GLOVE BIOGEL PI INDICATOR 8.5 (GLOVE) ×12
GLOVE ECLIPSE 7.5 STRL STRAW (GLOVE) ×4 IMPLANT
GLOVE ECLIPSE 8.0 STRL XLNG CF (GLOVE) ×28 IMPLANT
GLOVE EXAM NITRILE LRG STRL (GLOVE) IMPLANT
GLOVE EXAM NITRILE MD LF STRL (GLOVE) IMPLANT
GLOVE EXAM NITRILE XL STR (GLOVE) IMPLANT
GLOVE EXAM NITRILE XS STR PU (GLOVE) IMPLANT
GOWN SPEC L3 XXLG W/TWL (GOWN DISPOSABLE) ×16 IMPLANT
GOWN STRL NON-REIN LRG LVL3 (GOWN DISPOSABLE) ×4 IMPLANT
GOWN STRL REUS W/ TWL LRG LVL3 (GOWN DISPOSABLE) IMPLANT
GOWN STRL REUS W/ TWL XL LVL3 (GOWN DISPOSABLE) ×12 IMPLANT
GOWN STRL REUS W/TWL 2XL LVL3 (GOWN DISPOSABLE) IMPLANT
GOWN STRL REUS W/TWL LRG LVL3 (GOWN DISPOSABLE)
GOWN STRL REUS W/TWL XL LVL3 (GOWN DISPOSABLE) ×12
IMPLANT COROENT LORDOT 8X18X50 ×4 IMPLANT
INSERT FOGARTY 61MM (MISCELLANEOUS) IMPLANT
INSERT FOGARTY SM (MISCELLANEOUS) IMPLANT
KIT BASIN OR (CUSTOM PROCEDURE TRAY) ×8 IMPLANT
KIT DILATOR XLIF 5 (KITS) ×2 IMPLANT
KIT INFUSE MEDIUM (Orthopedic Implant) ×4 IMPLANT
KIT INFUSE X SMALL 1.4CC (Orthopedic Implant) ×4 IMPLANT
KIT NEEDLE NVM5 EMG ELECT (KITS) ×2 IMPLANT
KIT NEEDLE NVM5 EMG ELECTRODE (KITS) ×2
KIT ROOM TURNOVER OR (KITS) ×12 IMPLANT
KIT SURGICAL ACCESS MAXCESS 4 (KITS) ×4 IMPLANT
KIT XLIF (KITS) ×2
LIQUID BAND (GAUZE/BANDAGES/DRESSINGS) ×16 IMPLANT
LOOP VESSEL MAXI BLUE (MISCELLANEOUS) ×4 IMPLANT
LOOP VESSEL MINI RED (MISCELLANEOUS) ×4 IMPLANT
NEEDLE HYPO 25X1 1.5 SAFETY (NEEDLE) ×4 IMPLANT
NEEDLE RANFAC BLUNT 8X15 (NEEDLE) ×4 IMPLANT
NEEDLE SPNL 18GX3.5 QUINCKE PK (NEEDLE) ×4 IMPLANT
NS IRRIG 1000ML POUR BTL (IV SOLUTION) ×8 IMPLANT
PACK FOAM VITOSS 10CC (Orthopedic Implant) ×8 IMPLANT
PACK FOAM VITOSS 5CC (Orthopedic Implant) ×4 IMPLANT
PACK LAMINECTOMY NEURO (CUSTOM PROCEDURE TRAY) ×8 IMPLANT
PAD ARMBOARD 7.5X6 YLW CONV (MISCELLANEOUS) ×8 IMPLANT
SCREW 25MM (Screw) ×4 IMPLANT
SCREW BONE 4.5X22MM (Screw) ×4 IMPLANT
SPONGE INTESTINAL PEANUT (DISPOSABLE) ×20 IMPLANT
SPONGE LAP 18X18 X RAY DECT (DISPOSABLE) ×4 IMPLANT
SPONGE LAP 4X18 X RAY DECT (DISPOSABLE) IMPLANT
SPONGE SURGIFOAM ABS GEL SZ50 (HEMOSTASIS) IMPLANT
STAPLER SKIN PROX WIDE 3.9 (STAPLE) ×4 IMPLANT
STAPLER VISISTAT 35W (STAPLE) IMPLANT
SUT PROLENE 4 0 RB 1 (SUTURE) ×8
SUT PROLENE 4-0 RB1 .5 CRCL 36 (SUTURE) ×8 IMPLANT
SUT PROLENE 5 0 CC1 (SUTURE) IMPLANT
SUT PROLENE 6 0 C 1 30 (SUTURE) ×4 IMPLANT
SUT PROLENE 6 0 CC (SUTURE) IMPLANT
SUT SILK 0 TIES 10X30 (SUTURE) ×4 IMPLANT
SUT SILK 2 0 TIES 10X30 (SUTURE) ×4 IMPLANT
SUT SILK 2 0 TIES 17X18 (SUTURE) ×2
SUT SILK 2 0SH CR/8 30 (SUTURE) IMPLANT
SUT SILK 2-0 18XBRD TIE BLK (SUTURE) ×2 IMPLANT
SUT SILK 3 0 TIES 10X30 (SUTURE) ×4 IMPLANT
SUT SILK 3 0SH CR/8 30 (SUTURE) IMPLANT
SUT VIC AB 0 CT1 27 (SUTURE) ×6
SUT VIC AB 0 CT1 27XBRD ANBCTR (SUTURE) ×6 IMPLANT
SUT VIC AB 1 CT1 18XBRD ANBCTR (SUTURE) ×4 IMPLANT
SUT VIC AB 1 CT1 8-18 (SUTURE) ×4
SUT VIC AB 2-0 CT1 18 (SUTURE) ×8 IMPLANT
SUT VIC AB 2-0 CT1 27 (SUTURE) ×2
SUT VIC AB 2-0 CT1 27XBRD (SUTURE) ×2 IMPLANT
SUT VIC AB 2-0 CTB1 (SUTURE) ×4 IMPLANT
SUT VIC AB 3-0 SH 27 (SUTURE) ×2
SUT VIC AB 3-0 SH 27X BRD (SUTURE) ×2 IMPLANT
SUT VIC AB 3-0 SH 8-18 (SUTURE) ×8 IMPLANT
SUT VICRYL 4-0 PS2 18IN ABS (SUTURE) IMPLANT
SYR 20ML ECCENTRIC (SYRINGE) ×8 IMPLANT
SYR CONTROL 10ML LL (SYRINGE) ×4 IMPLANT
TAPE CLOTH 3X10 TAN LF (GAUZE/BANDAGES/DRESSINGS) ×12 IMPLANT
TOWEL OR 17X24 6PK STRL BLUE (TOWEL DISPOSABLE) ×12 IMPLANT
TOWEL OR 17X26 10 PK STRL BLUE (TOWEL DISPOSABLE) ×12 IMPLANT
TRAP SPECIMEN MUCOUS 40CC (MISCELLANEOUS) ×4 IMPLANT
TRAY FOLEY CATH 14FRSI W/METER (CATHETERS) ×8 IMPLANT
WATER STERILE IRR 1000ML POUR (IV SOLUTION) ×8 IMPLANT

## 2014-04-22 NOTE — Interval H&P Note (Signed)
History and Physical Interval Note:  04/22/2014 7:22 AM  [name]  has presented today for surgery, with the diagnosis of Scoliosis, HNP, Stenosis, Radiculopathy  The various methods of treatment have been discussed with the patient and family. After consideration of risks, benefits and other options for treatment, the patient has consented to  Procedure(s) with comments: L5-S1 Anterior lumbar interbody fusion with Dr. Scot Dock; Left L1-2 L2-3 L3-4 L4-5 Anterior lateral lumbar interbody fusion**Stage 1** (N/A) - L5-S1 Anterior lumbar interbody fusion with Dr. Scot Dock Left L1-2 L2-3 L3-4 L4-5 Anterior lateral lumbar interbody fusion**Stage 1** (Left) - Left L1-2 L2-3 L3-4 L4-5 Anterior lateral lumbar interbody fusion**Stage 1** ABDOMINAL EXPOSURE (N/A) as a surgical intervention .  The patient's history has been reviewed, patient examined, no change in status, stable for surgery.  I have reviewed the patient's chart and labs.  Questions were answered to the patient's satisfaction.     [name] S

## 2014-04-22 NOTE — Anesthesia Preprocedure Evaluation (Addendum)
Anesthesia Evaluation  Patient identified by MRN, date of birth, ID band Patient awake    Reviewed: Allergy & Precautions, H&P , NPO status   History of Anesthesia Complications Negative for: history of anesthetic complications  Airway Mallampati: II  TM Distance: >3 FB Neck ROM: Full    Dental  (+) Teeth Intact, Dental Advisory Given,    Pulmonary asthma , former smoker,    Pulmonary exam normal       Cardiovascular hypertension,     Neuro/Psych PSYCHIATRIC DISORDERS Anxiety Depression negative neurological ROS     GI/Hepatic Neg liver ROS, hiatal hernia, PUD, GERD-  ,  Endo/Other  Hypothyroidism   Renal/GU negative Renal ROS     Musculoskeletal   Abdominal   Peds  Hematology   Anesthesia Other Findings   Reproductive/Obstetrics                           Anesthesia Physical Anesthesia Plan  ASA: II  Anesthesia Plan: General   Post-op Pain Management:    Induction: Intravenous  Airway Management Planned: Oral ETT  Additional Equipment: Arterial line  Intra-op Plan:   Post-operative Plan: Extubation in OR  Informed Consent: I have reviewed the patients History and Physical, chart, labs and discussed the procedure including the risks, benefits and alternatives for the proposed anesthesia with the patient or authorized representative who has indicated his/her understanding and acceptance.   Dental advisory given  Plan Discussed with: CRNA, Anesthesiologist and Surgeon  Anesthesia Plan Comments:       Anesthesia Quick Evaluation

## 2014-04-22 NOTE — Brief Op Note (Signed)
04/22/2014  3:15 PM  PATIENT:  [name]  68 y.o. female  PRE-OPERATIVE DIAGNOSIS:  Scoliosis, HNP, Stenosis, spondylosis,Radiculopathy L 1 - S 1   POST-OPERATIVE DIAGNOSIS:  Scoliosis, HNP, Stenosis, spondylosis,Radiculopathy L 1 - S 1   PROCEDURE:  Procedure(s): Lumbar five/-Sacral one. Anterior lumbar interbody fusion with Dr. Scot Dock; Left Lumbar one/two, two/three/three/four, four/five. Anterior lateral lumbar interbody fusion**Stage 1** (N/A) Left Lumbar one/two, two/three, three/four, four,five. Anterior lateral lumbar interbody fusion**Stage 1** (Left) ABDOMINAL EXPOSURE (N/A)  SURGEON:  Surgeon(s) and Role: Panel 1:    * Erline Levine, MD - Primary    * Elaina Hoops, MD - Assisting  Panel 2:    * Angelia Mould, MD - Primary  PHYSICIAN ASSISTANT:   ASSISTANTS: [place], RN   ANESTHESIA:   general  EBL:  Total I/O In: 4000 [I.V.:3500; IV Piggyback:500] Out: 67 [Urine:730; Blood:200]  BLOOD ADMINISTERED:none  DRAINS: none   LOCAL MEDICATIONS USED:  LIDOCAINE   SPECIMEN:  No Specimen  DISPOSITION OF SPECIMEN:  N/A  COUNTS:  YES  TOURNIQUET:  * No tourniquets in log *  DICTATION: INDICATIONS:  Pateint is 68 year old female with chronic and intractable back and bilateral lower extremity pain with marked disc degeneration and scoliosis with spondylosis and stenosis throughout the lumbar spine.  She has previously undergone laminectomy at L5S1 level.  It was elected to take her to surgery for anterior lumbar decompression and fusion at the L 5 S1 level and XLIF l12, L 23, L 34, L 45 levels.  PROCEDURE:  Doctor Scot Dock performed exposure and his portion of the procedure will be dictated separately.  Upon exposing the L 5 S1 level, a localizing X ray was obtained with the C arm.  I then incised the anterior annulus and performed a thorough discectomy.  The endplates were cleared of disc and cartilagenous material and a thorough discectomy was performed with  decompression of the ventral annulus and disc material.  After trial, a 10 x 38 x 28 8 degree lordotic degree lordotic  was selected, packed with extra small BMP and Vitoss which was reconstituted with marrow rich blood aspirated from the S1 vertebral body.  The implant was tamped into position and positioning was confirmed with C arm.  5.5 mm Screws were then placed ( 25 mm at L5 and 22.5 mm at S 1).  Locking mechanisms were engaged, soft tissues were inspected and found to be in good repair.  Retractors were removed.  Fascia was closed with 0 vicryl running stitch, skin edges closed with 2-0 and 3-0 vicryl sutures.  Wound was dressed with a sterile occlusive dressing.  Patient was then placed in a right lateral decubitus position on the operative table and using orthogonally projected C-arm fluoroscopy the patient was placed so that the L 12, L2-3 L3-4 and L4-5 levels were visualized in AP and lateral plane. The patient was then taped into position. The table was flexed minimally because of previous left hip fracture. Skin was marked along with a posterior finger dissection incision. Her flank was then prepped and draped in usual sterile fashion and incisions were made sequentially at L4-5 L3-4 and L2-3 levels. Posterior finger dissection was made to enter the retroperitoneal space and then subsequently the probe was inserted into the psoas muscle from the left side initially at the L4-5 level. After mapping the neural elements were able to dock the probe per the midpoint of this vertebral level and without indications electrically of too close  proximity to the neural tissues. Subsequently the self-retaining tractor was.after sequential dilators were utilized the shim was employed and the interspace was cleared of psoas muscle and then incised. A thorough discectomy was performed. Instruments were used to clear the interspace of disc material. After thorough discectomy was performed and this was performed using AP  and lateral fluoroscopy an  8 lordotic by 55 x 22 mm implant was packed with BMP and Vitoss with autologous blood. This was tamped into position using the slides and its position was confirmed on AP and lateral fluoroscopy. Subsequently exposure was performed at the L3-4 level and similar dissection was performed with locking of the self-retaining retractor. At this level were able to place a 10 lordotic by 22 x 50 mm implant packed in a similar fashion. At the L2-3 level were able to place an 8 mm lordotic by 50 x 18 mm implant packed in a similar fashion. At the L 12 level, similar exposure was performed and an 8 x 22 x 45 mm implant was trialed, packed with BMP and Vitoss, then tamped into position. Hemostasis was assured the wounds were irrigated interrupted Vicryl sutures. Sterile occlusive dressing was placed with Dermabond and occlusive dressings. The patient was then extubated in the operating room and taken to recovery in stable and satisfactory condition having tolerated her operation well. Counts were correct at the end of the case.  The plan is for patient to return for Stage II pedicle screw fixation early next week.   PLAN OF CARE: Admit to inpatient   PATIENT DISPOSITION:  PACU - hemodynamically stable.   Delay start of Pharmacological VTE agent (>24hrs) due to surgical blood loss or risk of bleeding: no

## 2014-04-22 NOTE — Progress Notes (Signed)
Pt arrived to unit per stretcher from PACU with 2 PACU RNs. Report recd at bedside from Veryl Speak, PACU RN. No acute distress noted. Pt drowsy but arousable by voice/touch. 2 family members present at bedside. Will monitor pt closely.   Angeline Slim I 04/22/2014 5:22 PM

## 2014-04-22 NOTE — Anesthesia Postprocedure Evaluation (Signed)
Anesthesia Post Note  Patient: [name]  Procedure(s) Performed: Procedure(s) (LRB): Lumbar five/-Sacral one. Anterior lumbar interbody fusion with Dr. Scot Dock; Left Lumbar one/two, two/three/three/four, four/five. Anterior lateral lumbar interbody fusion**Stage 1** (N/A) Left Lumbar one/two, two/three, three/four, four,five. Anterior lateral lumbar interbody fusion**Stage 1** (Left) ABDOMINAL EXPOSURE (N/A)  Anesthesia type: general  Patient location: PACU  Post pain: Pain level controlled  Post assessment: Patient's Cardiovascular Status Stable  Last Vitals:  Filed Vitals:   04/22/14 1656  BP: 186/84  Pulse: 78  Temp: 36.9 C  Resp: 20    Post vital signs: Reviewed and stable  Level of consciousness: sedated  Complications: No apparent anesthesia complications

## 2014-04-22 NOTE — Progress Notes (Signed)
Awake, alert, conversant.  MAEW.  Doing well.

## 2014-04-22 NOTE — Op Note (Signed)
    NAME: [name]   MRN: [id number] DOB: Sep 01, 1945    DATE OF OPERATION: 04/22/2014  PREOP DIAGNOSIS: scoliosis and degenerative disc disease  POSTOP DIAGNOSIS: same  PROCEDURE: anterior retroperitoneal exposure of L5-S1  EXPOSURE SURGEON: Judeth Cornfield. Scot Dock, MD  SPINE SURGEON: Erline Levine, MD  ASSIST: Verdis Prime  ANESTHESIA: Gen.   EBL: minimal  INDICATIONS: [name] is a 68 y.o. female who I was asked to provide anterior retroperitoneal exposure of L5-S1  FINDINGS: significant calcific disease of the left common iliac artery  TECHNIQUE: The patient was taken to the operating room and received a general anesthetic. After careful positioning the level of the L5-S1 disc space was marked under fluoroscopy. The abdomen was prepped and draped in usual sterile fashion. A transverse incision was made at the marked level to the left of the midline. The dissection was carried down to the subcutaneous tissue to the anterior rectus sheath. The anterior rectus sheath was divided transversely and this and incision extended beyond the linea alba medially out to the lateral border of the rectus abdominis muscle laterally. The rectus abdominis muscle was then mobilized circumferentially initially by dissecting the anterior rectus sheath away from the muscle over a distance of proximally 6 cm. Likewise the anterior rectus sheath was mobilized inferiorly over a distance of approximate 6 cm. Initially the muscle was retracted medially allowing entry into the rectoperineal space laterally. The dissection was carried down bluntly to the cell S and then the iliac arteries were identified. The common iliac artery was significantly calcified. The dissection was carried distally this with the ureter mobilized medially. This allowed palpation of the sacral promontory. The crossing left common iliac vein was mobilized enough that it could be retracted superiorly area the Thompson retractor was  then placed in reverse lip retractor placed initially on the right side of the spine and then on the left side of the spine allowing adequate exposure of the L5-S1 disc. Position was confirmed under fluoroscopy. The remainder of the procedure is dictated by Dr. Vertell Limber.   Deitra Mayo, MD, FACS [facility]  DATE OF DICTATION:   04/22/2014

## 2014-04-22 NOTE — Transfer of Care (Signed)
Immediate Anesthesia Transfer of Care Note  Patient: [name]  Procedure(s) Performed: Procedure(s): Lumbar five/-Sacral one. Anterior lumbar interbody fusion with Dr. Scot Dock; Left Lumbar one/two, two/three/three/four, four/five. Anterior lateral lumbar interbody fusion**Stage 1** (N/A) Left Lumbar one/two, two/three, three/four, four,five. Anterior lateral lumbar interbody fusion**Stage 1** (Left) ABDOMINAL EXPOSURE (N/A)  Patient Location: PACU  Anesthesia Type:General  Level of Consciousness: sedated, patient cooperative and lethargic  Airway & Oxygen Therapy: Patient Spontanous Breathing and Patient connected to nasal cannula oxygen  Post-op Assessment: Report given to PACU RN, Post -op Vital signs reviewed and stable and Patient moving all extremities X 4  Post vital signs: Reviewed and stable  Complications: No apparent anesthesia complications

## 2014-04-22 NOTE — H&P (View-Only) (Signed)
Patient ID: [name], female   DOB: [birth year], 68 y.o.   MRN: [id number]  Reason for Consult:  Evaluate for anterior retroperitoneal exposure of L5-S1.    Referred by Dr Vertell Limber  Subjective:     HPI:  [name] is a 68 y.o. female who is scheduled for extensive back surgery tomorrow. She will have an ALIF at the L5-S1 level and also XLIF at L1-L2, L2-L3, L3-L4, and L4-L5. Vascular surgery was counseled to provide anterior retroperitoneal exposure of L5-S1.  She has a long history of back issues. However, she fractured her left hip in May and had a difficult time in rehabilitation because of weakness in both lower extremities more significantly on the right side which she has had for years. She denies significant pain session with her back problems.  She has had a previous laparoscopic cholecystectomy.  Past Medical History  Diagnosis Date  . Anxiety   . Depression   . Thyroid disease   . Allergy   . GERD (gastroesophageal reflux disease)   . IBS (irritable bowel syndrome)   . Ulcerative colitis   . Attention deficit disorder without mention of hyperactivity   . Unspecified asthma(493.90)   . Osteoarthrosis, unspecified whether generalized or localized, unspecified site   . Other and unspecified hyperlipidemia   . Diverticulosis   . Colitis 12/05/2010  . Unspecified essential hypertension     taken off meds since lost wt  . H/O hiatal hernia    Family History  Problem Relation Age of Onset  . Hypertension Father   . Colon cancer Father 58  . Arthritis Father   . Heart disease Father     CHF  . Diabetes Neg Hx   . Heart disease Mother     Died, 26  . Healthy Brother   . Healthy Daughter    Past Surgical History  Procedure Laterality Date  . Colonoscopy    . Upper gastrointestinal endoscopy    . Back surgery      fluid removed from between disks  . Shoulder surgery      right shoulder 3x   . Total shoulder arthroplasty    . Toe surgery      right  big toe  . Breast surgery      bil breast reduction   . Hip fracture surgery      left  11/01/2013  . Cholecystectomy      Laparoscopic cholecystectomy    Short Social History:  History  Substance Use Topics  . Smoking status: Former Smoker    Quit date: 10/30/1987  . Smokeless tobacco: Never Used  . Alcohol Use: 0.5 oz/week    1 drink(s) per week     Comment: once a week    Allergies  Allergen Reactions  . Ampicillin Nausea And Vomiting  . Erythromycin Nausea And Vomiting  . Morphine And Related Nausea And Vomiting    Current Outpatient Prescriptions  Medication Sig Dispense Refill  . albuterol (PROVENTIL HFA;VENTOLIN HFA) 108 (90 BASE) MCG/ACT inhaler Inhale 1 puff into the lungs 4 (four) times daily as needed for wheezing or shortness of breath.      . ALPRAZolam (XANAX) 0.5 MG tablet Take 0.5 mg by mouth as needed for sleep.      [name] buPROPion (WELLBUTRIN XL) 300 MG 24 hr tablet Take 300 mg by mouth daily.      . calcium citrate-vitamin D (CITRACAL+D) 315-200 MG-UNIT per tablet Take 1 tablet by mouth 2 (two)  times daily.      . Chlorpheniramine Maleate (ALLERGY PO) Take 1 tablet by mouth daily.      . Cholecalciferol (VITAMIN D PO) Take 1 tablet by mouth daily.      . Cyanocobalamin (VITAMIN B-12 PO) Take 2 tablets by mouth daily.      [name] etodolac (LODINE) 500 MG tablet Take 500 mg by mouth 2 (two) times daily.      [name] FLUoxetine (PROZAC) 20 MG capsule Take 60 mg by mouth daily.      [name] ibuprofen (ADVIL,MOTRIN) 200 MG tablet Take 800 mg by mouth daily as needed (pain).      . inFLIXimab (REMICADE) 100 MG injection Inject into the vein. Every 2 months      . lamoTRIgine (LAMICTAL) 200 MG tablet Take 200 mg by mouth daily.        [name] levothyroxine (SYNTHROID, LEVOTHROID) 100 MCG tablet Take 100 mcg by mouth daily before breakfast.      . Mesalamine (ASACOL HD) 800 MG TBEC Take 2,400 mg by mouth 2 (two) times daily.      . methylphenidate 27 MG PO CR tablet Take 27 mg by mouth  daily.       . Omega-3 Fatty Acids (OMEGA 3 PO) Take 1 capsule by mouth daily.      . pantoprazole (PROTONIX) 40 MG tablet Take 40 mg by mouth 2 (two) times daily.      . Probiotic Product (PROBIOTIC PO) Take 1 capsule by mouth daily.      . promethazine (PHENERGAN) 12.5 MG tablet Take 1 tablet (12.5 mg total) by mouth every 6 (six) hours as needed for nausea or vomiting.  30 tablet  3  . [DISCONTINUED] losartan-hydrochlorothiazide (HYZAAR) 50-12.5 MG per tablet Take 1 tablet by mouth daily.      . [DISCONTINUED] simvastatin (ZOCOR) 20 MG tablet Take 20 mg by mouth every evening.       No current facility-administered medications for this visit.   Facility-Administered Medications Ordered in Other Visits  Medication Dose Route Frequency Provider Last Rate Last Dose  . [START ON 04/25/2014] vancomycin (VANCOCIN) IVPB 1000 mg/200 mL premix  1,000 mg Intravenous On Call to Laird, MD      . Derrill Memo ON 04/22/2014] vancomycin (VANCOCIN) IVPB 1000 mg/200 mL premix  1,000 mg Intravenous On Call to Avalon, MD        Review of Systems  Constitutional: Negative for chills and fever.  Eyes: Negative for loss of vision.  Respiratory: Positive for wheezing. Negative for cough.  Cardiovascular: Negative for chest pain, chest tightness, claudication, dyspnea with exertion, orthopnea and palpitations.  GI: Negative for blood in stool and vomiting.  GU: Negative for dysuria and hematuria.  Musculoskeletal: Negative for leg pain, joint pain and myalgias.  Skin: Negative for rash and wound.  Neurological: Positive for focal weakness. Negative for dizziness and speech difficulty.  Hematologic: Negative for bruises/bleeds easily. Psychiatric: Positive for depressed mood.        Objective:  Objective  Filed Vitals:   04/21/14 1425  BP: 169/92  Pulse: 72  Temp: 97.9 F (36.6 C)  TempSrc: Oral  Resp: 16  Height: 5' 6"  (1.676 m)  Weight: 153 lb (69.4 kg)  SpO2: 100%   Body mass  index is 24.71 kg/(m^2).  Physical Exam  Constitutional: She is oriented to person, place, and time. She appears well-developed and well-nourished.  HENT:  Head: Normocephalic and atraumatic.  Neck: Neck supple. No  JVD present. No thyromegaly present.  Cardiovascular: Normal rate, regular rhythm and normal heart sounds.  Exam reveals no friction rub.   No murmur heard. Pulses:      Femoral pulses are 2+ on the right side, and 2+ on the left side.      Posterior tibial pulses are 2+ on the right side, and 2+ on the left side.  I do not detect carotid bruits  Pulmonary/Chest: Breath sounds normal. She has no wheezes. She has no rales.  Abdominal: Soft. Bowel sounds are normal. There is no tenderness.  Musculoskeletal: Normal range of motion. She exhibits no edema.  Lymphadenopathy:    She has no cervical adenopathy.  Neurological: She is alert and oriented to person, place, and time. She has normal strength. No sensory deficit.  Skin: No lesion and no rash noted.  Psychiatric: She has a normal mood and affect.    Data: I have reviewed her previous CT scans, MRIs, and plain x-rays. She has significant degenerative disc disease at multiple levels in her back.      Assessment/Plan:     DDD (degenerative disc disease), lumbosacral The patient appears to be a good candidate for anterior retroperitoneal exposure of L5-S1. I have reviewed our role in exposure of the spine in order to allow anterior lumbar interbody fusion at the appropriate levels. We have discussed the potential complications of surgery, including but not limited to, arterial or venous injury, thrombosis, or bleeding. We have also discussed the potential risks of wound healing problems, the development of a hernia, nerve injury, leg swelling, or other unpredictable medical problems. All the patient's questions were answered and they are agreeable to proceed.    Angelia Mould MD [facility]

## 2014-04-22 NOTE — Progress Notes (Signed)
Utilization review completed.  

## 2014-04-22 NOTE — Op Note (Signed)
04/22/2014  3:15 PM  PATIENT:  [name]  68 y.o. female  PRE-OPERATIVE DIAGNOSIS:  Scoliosis, HNP, Stenosis, spondylosis,Radiculopathy L 1 - S 1   POST-OPERATIVE DIAGNOSIS:  Scoliosis, HNP, Stenosis, spondylosis,Radiculopathy L 1 - S 1   PROCEDURE:  Procedure(s): Lumbar five/-Sacral one. Anterior lumbar interbody fusion with Dr. Scot Dock; Left Lumbar one/two, two/three/three/four, four/five. Anterior lateral lumbar interbody fusion**Stage 1** (N/A) Left Lumbar one/two, two/three, three/four, four,five. Anterior lateral lumbar interbody fusion**Stage 1** (Left) ABDOMINAL EXPOSURE (N/A)  SURGEON:  Surgeon(s) and Role: Panel 1:    * Erline Levine, MD - Primary    * Elaina Hoops, MD - Assisting  Panel 2:    * Angelia Mould, MD - Primary  PHYSICIAN ASSISTANT:   ASSISTANTS: Poteat, RN   ANESTHESIA:   general  EBL:  Total I/O In: 4000 [I.V.:3500; IV Piggyback:500] Out: 63 [Urine:730; Blood:200]  BLOOD ADMINISTERED:none  DRAINS: none   LOCAL MEDICATIONS USED:  LIDOCAINE   SPECIMEN:  No Specimen  DISPOSITION OF SPECIMEN:  N/A  COUNTS:  YES  TOURNIQUET:  * No tourniquets in log *  DICTATION: INDICATIONS:  Pateint is 68 year old female with chronic and intractable back and bilateral lower extremity pain with marked disc degeneration and scoliosis with spondylosis and stenosis throughout the lumbar spine.  She has previously undergone laminectomy at L5S1 level.  It was elected to take her to surgery for anterior lumbar decompression and fusion at the L 5 S1 level and XLIF l12, L 23, L 34, L 45 levels.  PROCEDURE:  Doctor Scot Dock performed exposure and his portion of the procedure will be dictated separately.  Upon exposing the L 5 S1 level, a localizing X ray was obtained with the C arm.  I then incised the anterior annulus and performed a thorough discectomy.  The endplates were cleared of disc and cartilagenous material and a thorough discectomy was performed with  decompression of the ventral annulus and disc material.  After trial, a 10 x 38 x 28 8 degree lordotic degree lordotic  was selected, packed with extra small BMP and Vitoss which was reconstituted with marrow rich blood aspirated from the S1 vertebral body.  The implant was tamped into position and positioning was confirmed with C arm.  5.5 mm Screws were then placed ( 25 mm at L5 and 22.5 mm at S 1).  Locking mechanisms were engaged, soft tissues were inspected and found to be in good repair.  Retractors were removed.  Fascia was closed with 0 vicryl running stitch, skin edges closed with 2-0 and 3-0 vicryl sutures.  Wound was dressed with a sterile occlusive dressing.  Patient was then placed in a right lateral decubitus position on the operative table and using orthogonally projected C-arm fluoroscopy the patient was placed so that the L 12, L2-3 L3-4 and L4-5 levels were visualized in AP and lateral plane. The patient was then taped into position. The table was flexed minimally because of previous left hip fracture. Skin was marked along with a posterior finger dissection incision. Her flank was then prepped and draped in usual sterile fashion and incisions were made sequentially at L4-5 L3-4 and L2-3 levels. Posterior finger dissection was made to enter the retroperitoneal space and then subsequently the probe was inserted into the psoas muscle from the left side initially at the L4-5 level. After mapping the neural elements were able to dock the probe per the midpoint of this vertebral level and without indications electrically of too close  proximity to the neural tissues. Subsequently the self-retaining tractor was.after sequential dilators were utilized the shim was employed and the interspace was cleared of psoas muscle and then incised. A thorough discectomy was performed. Instruments were used to clear the interspace of disc material. After thorough discectomy was performed and this was performed using AP  and lateral fluoroscopy an  8 lordotic by 55 x 22 mm implant was packed with BMP and Vitoss with autologous blood. This was tamped into position using the slides and its position was confirmed on AP and lateral fluoroscopy. Subsequently exposure was performed at the L3-4 level and similar dissection was performed with locking of the self-retaining retractor. At this level were able to place a 10 lordotic by 22 x 50 mm implant packed in a similar fashion. At the L2-3 level were able to place an 8 mm lordotic by 50 x 18 mm implant packed in a similar fashion. At the L 12 level, similar exposure was performed and an 8 x 22 x 45 mm implant was trialed, packed with BMP and Vitoss, then tamped into position. Hemostasis was assured the wounds were irrigated interrupted Vicryl sutures. Sterile occlusive dressing was placed with Dermabond and occlusive dressings. The patient was then extubated in the operating room and taken to recovery in stable and satisfactory condition having tolerated her operation well. Counts were correct at the end of the case.  The plan is for patient to return for Stage II pedicle screw fixation early next week.   PLAN OF CARE: Admit to inpatient   PATIENT DISPOSITION:  PACU - hemodynamically stable.   Delay start of Pharmacological VTE agent (>24hrs) due to surgical blood loss or risk of bleeding: no

## 2014-04-22 NOTE — Anesthesia Procedure Notes (Signed)
Procedure Name: Intubation Date/Time: 04/22/2014 7:50 AM Performed by: Ned Grace Pre-anesthesia Checklist: Patient identified, Timeout performed, Emergency Drugs available, Suction available and Patient being monitored Patient Re-evaluated:Patient Re-evaluated prior to inductionOxygen Delivery Method: Circle system utilized Preoxygenation: Pre-oxygenation with 100% oxygen Intubation Type: IV induction Ventilation: Mask ventilation without difficulty Laryngoscope Size: Mac and 3 Grade View: Grade I Tube type: Oral Tube size: 7.0 mm Number of attempts: 1 Airway Equipment and Method: Stylet Placement Confirmation: ETT inserted through vocal cords under direct vision,  breath sounds checked- equal and bilateral and positive ETCO2 Secured at: 20 cm Tube secured with: Tape Dental Injury: Teeth and Oropharynx as per pre-operative assessment

## 2014-04-23 MED ORDER — METHYLPHENIDATE HCL ER (OSM) 18 MG PO TBCR
18.0000 mg | EXTENDED_RELEASE_TABLET | Freq: Every day | ORAL | Status: DC
  Administered 2014-04-24: 18 mg via ORAL
  Filled 2014-04-23: qty 1

## 2014-04-23 MED ORDER — PANTOPRAZOLE SODIUM 40 MG PO TBEC
40.0000 mg | DELAYED_RELEASE_TABLET | Freq: Every day | ORAL | Status: DC
  Administered 2014-04-23 – 2014-04-29 (×7): 40 mg via ORAL
  Filled 2014-04-23 (×7): qty 1

## 2014-04-23 NOTE — Evaluation (Signed)
Physical Therapy Evaluation Patient Details Name: [name] MRN: [id number] DOB: 09-19-1945 Today's Date: 04/23/2014   History of Present Illness  68 y.o female who underwent ALIF and XLIF 04-22-14.  She is scheduled for a PLIF 04-25-14.  Clinical Impression  Patient is s/p above surgery resulting in the deficits listed below (see PT Problem List). Mobility was limited on eval by lethargy, suspected to be due to medication.  It is the opinion of this PT that pt will progress quickly once better able to participate in therapy sessions.  She required +2 mod to max assist for functional mobiltiy. Bed to chair performed with RW. Patient will benefit from skilled PT to increase their independence and safety with mobility (while adhering to their precautions) to allow discharge to SNF for further PT services.        Follow Up Recommendations SNF;Supervision/Assistance - 24 hour    Equipment Recommendations  None recommended by PT    Recommendations for Other Services       Precautions / Restrictions Precautions Precautions: Fall;Back Required Braces or Orthoses: Spinal Brace Spinal Brace: Thoracolumbosacral orthotic;Applied in sitting position Restrictions Weight Bearing Restrictions: No      Mobility  Bed Mobility Overal bed mobility: Needs Assistance;+2 for physical assistance Bed Mobility: Supine to Sit;Rolling Rolling: Mod assist   Supine to sit: +2 for physical assistance;Max assist;HOB elevated     General bed mobility comments: pt very lethargic requiring constant verbal cues to stay awake  Transfers Overall transfer level: Needs assistance Equipment used: Rolling walker (2 wheeled) Transfers: Sit to/from [facility] Sit to Stand: +2 physical assistance;Mod assist Stand pivot transfers: Mod assist;+2 physical assistance       General transfer comment: continuous verbal cues for sequencing and to stay awake, assist with RW  management  Ambulation/Gait                Stairs            Wheelchair Mobility    Modified Rankin (Stroke Patients Only)       Balance                                             Pertinent Vitals/Pain Pain Assessment: Faces Faces Pain Scale: Hurts little more Pain Location: back Pain Intervention(s): Monitored during session;Repositioned    Home Living Family/patient expects to be discharged to:: Private residence Living Arrangements: Spouse/significant other Available Help at Discharge: Family Type of Home: House Home Access: Stairs to enter   Technical brewer of Steps: 2 Home Layout: One level Home Equipment: Environmental consultant - 2 wheels      Prior Function Level of Independence: Independent with assistive device(s)               Hand Dominance        Extremity/Trunk Assessment   Upper Extremity Assessment: Defer to OT evaluation           Lower Extremity Assessment: Overall WFL for tasks assessed         Communication   Communication: No difficulties  Cognition Arousal/Alertness: Lethargic;Suspect due to medications Behavior During Therapy: [facility] for tasks assessed/performed Overall Cognitive Status: Within Functional Limits for tasks assessed       Memory: Decreased recall of precautions              General Comments  Exercises        Assessment/Plan    PT Assessment Patient needs continued PT services  PT Diagnosis Difficulty walking;Acute pain   PT Problem List Decreased strength;Decreased balance;Decreased activity tolerance;Decreased mobility;Decreased safety awareness;Pain  PT Treatment Interventions DME instruction;Gait training;Stair training;Functional mobility training;Therapeutic activities;Patient/family education;Balance training   PT Goals (Current goals can be found in the Care Plan section) Acute Rehab PT Goals Patient Stated Goal: unable to state PT Goal Formulation: Patient  unable to participate in goal setting Time For Goal Achievement: 04/30/14 Potential to Achieve Goals: Good    Frequency Min 5X/week   Barriers to discharge        Co-evaluation               End of Session Equipment Utilized During Treatment: Gait belt;Back brace Activity Tolerance: Patient limited by lethargy Patient left: in chair;with call bell/phone within reach;with chair alarm set Nurse Communication: Mobility status         Time: 6378-5885 PT Time Calculation (min): 27 min   Charges:   PT Evaluation $Initial PT Evaluation Tier I: 1 Procedure PT Treatments $Therapeutic Activity: 8-22 mins   PT G Codes:          Lorriane Shire 04/23/2014, 10:30 AM

## 2014-04-23 NOTE — Progress Notes (Signed)
Patient ID: [name], female   DOB: 12-12-1945, 68 y.o.   MRN: [id number] Patient had exposure by Dr. Shanon Rosser for ALIF yesterday. Patient confused this a.m. Abdomen soft nontender 2+ posterior tibial pulse left foot  Doing well from a vascular standpoint We'll see again at your request

## 2014-04-23 NOTE — Progress Notes (Signed)
Occupational Therapy Evaluation Patient Details Name: [name] MRN: [id number] DOB: 04-02-1946 Today's Date: 04/23/2014    History of Present Illness 67 y.o female who underwent ALIF and XLIF 04-22-14.  She is scheduled for a PLIF 04-25-14.   Clinical Impression   PTA pt lived at home with her husband, however pt presents with decreased cognition and unsure of accuracy of information provided. Per chart, pt was independent with use of assistive device. Pt requires assist for functional transfers and for ADLs due to back pain and decreased cognition and would benefit from ST Rehab at SNF prior to return home. Pt will benefit from acute OT to address functional mobility, back precautions, and safety with ADLs.     Follow Up Recommendations  SNF;Supervision/Assistance - 24 hour    Equipment Recommendations  3 in 1 bedside comode    Recommendations for Other Services       Precautions / Restrictions Precautions Precautions: Fall;Back Precaution Booklet Issued: Yes (comment) Precaution Comments: Educated pt briefly on 3/3 back precautions.  Required Braces or Orthoses: Spinal Brace Spinal Brace: Thoracolumbosacral orthotic;Applied in sitting position Restrictions Weight Bearing Restrictions: No      Mobility Bed Mobility Overal bed mobility: Needs Assistance;+2 for physical assistance Bed Mobility: Sit to Sidelying;Rolling Rolling: Mod assist       Sit to sidelying: Max assist General bed mobility comments: Pt required max (A) and VC's to lie on side and bring LEs up.   Transfers Overall transfer level: Needs assistance Equipment used: Rolling walker (2 wheeled) Transfers: Sit to/from [facility] Sit to Stand: Min assist;+2 physical assistance Stand pivot transfers: Min assist;+2 physical assistance       General transfer comment: VC's for sequencing and assist with RW management.          ADL Overall ADL's : Needs  assistance/impaired Eating/Feeding: Independent;Sitting   Grooming: Set up;Sitting   Upper Body Bathing: Minimal assitance;Sitting   Lower Body Bathing: Maximal assistance;Sit to/from stand;+2 for safety/equipment   Upper Body Dressing : Minimal assistance;Sitting   Lower Body Dressing: Total assistance;+2 for physical assistance;Sit to/from stand   Toilet Transfer: Minimal assistance;+2 for physical assistance;Stand-pivot;BSC;RW   Toileting- Clothing Manipulation and Hygiene: Total assistance;+2 for physical assistance;Sit to/from stand Toileting - Clothing Manipulation Details (indicate cue type and reason): Pt required assist to maintain balance and (A) to perform toilet hygiene.        General ADL Comments: Pt limited by confusion and back pain.         Perception Perception Perception Tested?: No   Praxis Praxis Praxis tested?: Within functional limits    Pertinent Vitals/Pain Pain Assessment: Faces Pain Score: 4  Faces Pain Scale: Hurts little more Pain Location: back Pain Intervention(s): Limited activity within patient's tolerance;Monitored during session;Repositioned        Extremity/Trunk Assessment Upper Extremity Assessment Upper Extremity Assessment: Overall WFL for tasks assessed   Lower Extremity Assessment Lower Extremity Assessment: Overall WFL for tasks assessed   Cervical / Trunk Assessment Cervical / Trunk Assessment: Normal   Communication Communication Communication: No difficulties   Cognition Arousal/Alertness: Lethargic;Suspect due to medications Behavior During Therapy: Flat affect Overall Cognitive Status: Impaired/Different from baseline Area of Impairment: Memory;Following commands;Safety/judgement;Awareness;Problem solving;Attention   Current Attention Level: Sustained Memory: Decreased recall of precautions Following Commands: Follows one step commands with increased time Safety/Judgement: Decreased awareness of  safety Awareness: Emergent Problem Solving: Slow processing;Decreased initiation;Difficulty sequencing;Requires verbal cues;Requires tactile cues  Home Living Family/patient expects to be discharged to:: Private residence Living Arrangements: Spouse/significant other Available Help at Discharge: Family Type of Home: Apartment                           Additional Comments: Pt confused and unable to provide reliable information. Per PT note, pt lives in house however pt reports that she lives in a [place] with her husband. Will plan to ask family when at bedside.      Prior Functioning/Environment Level of Independence: Independent with assistive device(s)        Comments: per PT note    OT Diagnosis: Generalized weakness;Acute pain   OT Problem List: Decreased strength;Decreased range of motion;Decreased activity tolerance;Impaired balance (sitting and/or standing);Decreased cognition;Decreased safety awareness;Decreased knowledge of use of DME or AE;Decreased knowledge of precautions;Pain   OT Treatment/Interventions: Self-care/ADL training;Therapeutic exercise;Energy conservation;DME and/or AE instruction;Therapeutic activities;Cognitive remediation/compensation;Patient/family education;Balance training    OT Goals(Current goals can be found in the care plan section) Acute Rehab OT Goals Patient Stated Goal: unable to state OT Goal Formulation: Patient unable to participate in goal setting Time For Goal Achievement: 05/07/14 Potential to Achieve Goals: Good ADL Goals Pt Will Perform Grooming: with supervision;standing;with set-up Pt Will Perform Lower Body Bathing: with set-up;with supervision;with adaptive equipment;sit to/from stand Pt Will Perform Lower Body Dressing: with set-up;with supervision;with adaptive equipment;sit to/from stand Pt Will Transfer to Toilet: with supervision;ambulating;bedside commode Pt Will Perform Toileting - Clothing  Manipulation and hygiene: with min assist;sit to/from stand Additional ADL Goal #1: Pt will Independently verbalize 3/3 back precautions 100% accurately.   OT Frequency: Min 2X/week              End of Session Equipment Utilized During Treatment: Gait belt;Rolling walker;Back brace Nurse Communication: Mobility status  Activity Tolerance: Patient limited by pain Patient left: in bed;with call bell/phone within reach;with bed alarm set   Time: 1457-1515 OT Time Calculation (min): 18 min Charges:  OT General Charges $OT Visit: 1 Procedure OT Evaluation $Initial OT Evaluation Tier I: 1 Procedure OT Treatments $Self Care/Home Management : 8-22 mins  Villa Herb M 04/23/2014, 3:48 PM  Secundino Ginger Lynetta Mare, OTR/L Occupational Therapist [phone] (pager)

## 2014-04-23 NOTE — Progress Notes (Signed)
Patient ID: [name], female   DOB: 11-20-1945, 68 y.o.   MRN: [id number] Doing well postop from stage I of anterior posterior fusion  Condition of back pain and no leg pain  Strength 5 out of 5 wound clean dry and intact  Continue to observe and work with therapy over the weekend plan posterior fusion on Monday

## 2014-04-24 ENCOUNTER — Inpatient Hospital Stay (HOSPITAL_COMMUNITY): Payer: Medicare Other

## 2014-04-24 LAB — URINALYSIS, ROUTINE W REFLEX MICROSCOPIC
Bilirubin Urine: NEGATIVE
Glucose, UA: NEGATIVE mg/dL
HGB URINE DIPSTICK: NEGATIVE
Ketones, ur: NEGATIVE mg/dL
Leukocytes, UA: NEGATIVE
NITRITE: NEGATIVE
Protein, ur: NEGATIVE mg/dL
SPECIFIC GRAVITY, URINE: 1.008 (ref 1.005–1.030)
Urobilinogen, UA: 0.2 mg/dL (ref 0.0–1.0)
pH: 6 (ref 5.0–8.0)

## 2014-04-24 LAB — BASIC METABOLIC PANEL
ANION GAP: 10 (ref 5–15)
BUN: 10 mg/dL (ref 6–23)
CHLORIDE: 100 meq/L (ref 96–112)
CO2: 28 mEq/L (ref 19–32)
Calcium: 9.6 mg/dL (ref 8.4–10.5)
Creatinine, Ser: 0.62 mg/dL (ref 0.50–1.10)
GFR calc Af Amer: >90 mL/min (ref >90)
GFR calc non Af Amer: >90 mL/min (ref >90)
GLUCOSE: 104 mg/dL — AB (ref 70–99)
Potassium: 4.1 mEq/L (ref 3.7–5.3)
Sodium: 138 mEq/L (ref 137–147)

## 2014-04-24 LAB — CBC
HEMATOCRIT: 30.2 % — AB (ref 36.0–46.0)
HEMOGLOBIN: 9.6 g/dL — AB (ref 12.0–15.0)
MCH: 27.6 pg (ref 26.0–34.0)
MCHC: 31.8 g/dL (ref 30.0–36.0)
MCV: 86.8 fL (ref 78.0–100.0)
Platelets: 235 10*3/uL (ref 150–400)
RBC: 3.48 MIL/uL — AB (ref 3.87–5.11)
RDW: 15.4 % (ref 11.5–15.5)
WBC: 11.6 10*3/uL — AB (ref 4.0–10.5)

## 2014-04-24 MED ORDER — METHYLPHENIDATE HCL ER 18 MG PO TB24
18.0000 mg | ORAL_TABLET | Freq: Every day | ORAL | Status: DC
  Administered 2014-04-25 – 2014-04-29 (×4): 18 mg via ORAL
  Filled 2014-04-24 (×5): qty 1

## 2014-04-24 NOTE — Plan of Care (Signed)
Problem: Phase I Progression Outcomes Goal: Voiding-avoid urinary catheter unless indicated Outcome: Completed/Met Date Met:  04/24/14     

## 2014-04-24 NOTE — Progress Notes (Signed)
Night RN reports patient up all night; attempting to get out of bed without assistance; freq. Voiding; confused to place, time, and reason for hospitalization.  Attempting to call her husband, "I want to leave", per patient.  Dr.Cram notified of confusion; orders received; patient safety maintained; bed alarm in use; reoriented patient; patient's spouse called; he is in route to see her; reorienting patient frequently and back precautions.

## 2014-04-24 NOTE — Progress Notes (Signed)
Pt was up all night and did not sleep any.  Since patient was so confused we have been monitoring her pain and treating when asked to.  Within 49mins of giving only one of the two Vicodin patient was anxious, energized, and made numerous attempts to get out of bed with no regard to surgical guidelines or back prevention techniques.  Patient was reorientated to surroundings and precautions for her back, patient was not compliant with this information.

## 2014-04-24 NOTE — Plan of Care (Signed)
Problem: Phase I Progression Outcomes Goal: OOB as tolerated unless otherwise ordered Outcome: Progressing     

## 2014-04-24 NOTE — Care Management Note (Signed)
    Page 1 of 1   04/24/2014     3:24:34 PM CARE MANAGEMENT NOTE 04/24/2014  Patient:  VINETTE, CRITES   Account Number:  [id number]  Date Initiated:  04/24/2014  Documentation initiated by:  [facility]  Subjective/Objective Assessment:   adm: Lumbar five/-Sacral one. Anterior lumbar interbody fusion with Dr. Scot Dock; Left Lumbar one/two, two/three/three/four, four/five. Anterior lateral lumbar interbody fusion**Stage 1** (N/A)  Left Lumbar one/two, two/three, three/four, fo     Action/Plan:   SNF for rehab   Anticipated DC Date:  04/26/2014   Anticipated DC Plan:  [place]  CM consult      Choice offered to / List presented to:             Status of service:  Completed, signed off Medicare Important Message given?   (If response is "NO", the following Medicare IM given date fields will be blank) Date Medicare IM given:   Medicare IM given by:   Date Additional Medicare IM given:   Additional Medicare IM given by:    Discharge Disposition:  [place]  Per UR Regulation:    If discussed at Long Length of Stay Meetings, dates discussed:    Comments:  06/24/13 15:22 CM notes both PT and OT are recc. SNF.  Pt sleeping when visited by CM; CSW to arrange.  No other CM needs were comunicated.  Mariane Masters, BSN, CM [phone].

## 2014-04-24 NOTE — Progress Notes (Signed)
Patient ID: [name], female   DOB: 1945/07/21, 68 y.o.   MRN: [id number] Patient's overall seems to be doing well had some episodes of confusion and agitation this morning but seems to resolve.   Neurologically intact and nonfocal  We checked a chest x-ray and ordered CBC and BMP labs are still pending.

## 2014-04-24 NOTE — Progress Notes (Signed)
Physical Therapy Treatment Patient Details Name: [name] MRN: [id number] DOB: [birth year] Today's Date: 04/24/2014    History of Present Illness      PT Comments    Pt very confused today.  Pt is scheduled for part 2 of her back surgery tomorrow.  Labs have been ordered to determine the source of the confusion.  Her procedure may be postponed pending the results.  Follow Up Recommendations  SNF;Supervision/Assistance - 24 hour     Equipment Recommendations  None recommended by PT    Recommendations for Other Services       Precautions / Restrictions Precautions Precautions: Back;Fall Precaution Comments: Reviewed 3/3 back precautions.  Pt not retaining information/education at this time due to confusion. Required Braces or Orthoses: Spinal Brace Spinal Brace: Thoracolumbosacral orthotic;Applied in sitting position    Mobility  Bed Mobility               General bed mobility comments: Pt is recliner upon PT arrival.  Transfers   Equipment used: Rolling walker (2 wheeled)   Sit to Stand: Min assist;+2 safety/equipment Stand pivot transfers: Min assist;+2 safety/equipment       General transfer comment: VC's for sequencing and assist with RW management.   Ambulation/Gait Ambulation/Gait assistance: Min assist Ambulation Distance (Feet): 50 Feet Assistive device: Rolling walker (2 wheeled) Gait Pattern/deviations: Step-through pattern;Decreased stride length Gait velocity: decreased   General Gait Details: unsteady with poor safety awareness, verbal cues for back precautions during gait   Stairs            Wheelchair Mobility    Modified Rankin (Stroke Patients Only)       Balance Overall balance assessment: Needs assistance Sitting-balance support: No upper extremity supported;Feet supported Sitting balance-Leahy Scale: Fair     Standing balance support: During functional activity;Bilateral upper extremity supported Standing  balance-Leahy Scale: Poor                      Cognition Arousal/Alertness: Awake/alert Behavior During Therapy: Impulsive Overall Cognitive Status: Impaired/Different from baseline Area of Impairment: Memory;Following commands;Safety/judgement;Awareness;Problem solving;Attention   Current Attention Level: Sustained Memory: Decreased recall of precautions;Decreased short-term memory Following Commands: Follows one step commands inconsistently Safety/Judgement: Decreased awareness of safety;Decreased awareness of deficits Awareness: Emergent Problem Solving: Slow processing;Decreased initiation;Difficulty sequencing;Requires verbal cues;Requires tactile cues      Exercises      General Comments        Pertinent Vitals/Pain Pain Assessment: Faces Faces Pain Scale: Hurts little more Pain Location: back Pain Intervention(s): Monitored during session;Repositioned;Limited activity within patient's tolerance    Home Living                      Prior Function            PT Goals (current goals can now be found in the care plan section) Progress towards PT goals: Progressing toward goals    Frequency  Min 5X/week    PT Plan Current plan remains appropriate    Co-evaluation             End of Session Equipment Utilized During Treatment: Gait belt;Back brace Activity Tolerance: Patient limited by lethargy Patient left: in chair;with call bell/phone within reach;with chair alarm set;with family/visitor present     Time: 9038-3338 PT Time Calculation (min): 17 min  Charges:  $Gait Training: 8-22 mins  G Codes:      Lorriane Shire 04/24/2014, 1:01 PM

## 2014-04-25 ENCOUNTER — Inpatient Hospital Stay (HOSPITAL_COMMUNITY): Payer: Medicare Other

## 2014-04-25 ENCOUNTER — Encounter (HOSPITAL_COMMUNITY): Payer: Self-pay | Admitting: Anesthesiology

## 2014-04-25 ENCOUNTER — Inpatient Hospital Stay (HOSPITAL_COMMUNITY): Admission: RE | Admit: 2014-04-25 | Payer: Medicare Other | Source: Ambulatory Visit | Admitting: Neurosurgery

## 2014-04-25 ENCOUNTER — Inpatient Hospital Stay (HOSPITAL_COMMUNITY): Payer: Medicare Other | Admitting: Anesthesiology

## 2014-04-25 ENCOUNTER — Encounter (HOSPITAL_COMMUNITY)
Admission: RE | Disposition: A | Discharge status: Skilled Nursing Facility | Payer: Self-pay | Source: Ambulatory Visit | Attending: Neurosurgery

## 2014-04-25 HISTORY — PX: LUMBAR PERCUTANEOUS PEDICLE SCREW 4 LEVEL: SHX6318

## 2014-04-25 SURGERY — LUMBAR PERCUTANEOUS PEDICLE SCREW 4 LEVEL
Anesthesia: General | Site: Spine Lumbar

## 2014-04-25 MED ORDER — DIPHENHYDRAMINE HCL 50 MG/ML IJ SOLN
INTRAMUSCULAR | Status: AC
  Filled 2014-04-25: qty 1

## 2014-04-25 MED ORDER — OXYCODONE HCL 5 MG PO TABS: 5.0000 mg | ORAL_TABLET | Freq: Once | ORAL | Status: DC | PRN

## 2014-04-25 MED ORDER — ARTIFICIAL TEARS OP OINT
TOPICAL_OINTMENT | OPHTHALMIC | Status: DC | PRN
  Administered 2014-04-25: 1 via OPHTHALMIC

## 2014-04-25 MED ORDER — PROPOFOL 10 MG/ML IV BOLUS
INTRAVENOUS | Status: DC | PRN
  Administered 2014-04-25: 150 mg via INTRAVENOUS

## 2014-04-25 MED ORDER — SODIUM CHLORIDE 0.9 % IJ SOLN
3.0000 mL | Freq: Two times a day (BID) | INTRAMUSCULAR | Status: DC
  Administered 2014-04-25 – 2014-04-28 (×3): 3 mL via INTRAVENOUS

## 2014-04-25 MED ORDER — SENNA 8.6 MG PO TABS
1.0000 | ORAL_TABLET | Freq: Two times a day (BID) | ORAL | Status: DC
  Administered 2014-04-25 – 2014-04-28 (×5): 8.6 mg via ORAL
  Filled 2014-04-25 (×3): qty 1

## 2014-04-25 MED ORDER — VANCOMYCIN HCL IN DEXTROSE 1-5 GM/200ML-% IV SOLN
INTRAVENOUS | Status: AC
  Administered 2014-04-25: 1000 mg via INTRAVENOUS
  Filled 2014-04-25: qty 200

## 2014-04-25 MED ORDER — LACTATED RINGERS IV SOLN
INTRAVENOUS | Status: DC | PRN
  Administered 2014-04-25 (×2): via INTRAVENOUS

## 2014-04-25 MED ORDER — BUPIVACAINE HCL (PF) 0.5 % IJ SOLN
INTRAMUSCULAR | Status: DC | PRN
  Administered 2014-04-25: 15 mL

## 2014-04-25 MED ORDER — ARTIFICIAL TEARS OP OINT
TOPICAL_OINTMENT | OPHTHALMIC | Status: AC
  Filled 2014-04-25: qty 3.5

## 2014-04-25 MED ORDER — ONDANSETRON HCL 4 MG/2ML IJ SOLN: 4.0000 mg | Freq: Four times a day (QID) | INTRAMUSCULAR | Status: DC | PRN

## 2014-04-25 MED ORDER — MIDAZOLAM HCL 5 MG/5ML IJ SOLN
INTRAMUSCULAR | Status: DC | PRN
  Administered 2014-04-25: 1 mg via INTRAVENOUS

## 2014-04-25 MED ORDER — BISACODYL 10 MG RE SUPP: 10.0000 mg | Freq: Every day | RECTAL | Status: DC | PRN

## 2014-04-25 MED ORDER — LIDOCAINE HCL (CARDIAC) 20 MG/ML IV SOLN
INTRAVENOUS | Status: AC
  Filled 2014-04-25: qty 5

## 2014-04-25 MED ORDER — ONDANSETRON HCL 4 MG/2ML IJ SOLN
INTRAMUSCULAR | Status: DC | PRN
  Administered 2014-04-25: 4 mg via INTRAVENOUS

## 2014-04-25 MED ORDER — VANCOMYCIN HCL IN DEXTROSE 1-5 GM/200ML-% IV SOLN
1000.0000 mg | Freq: Once | INTRAVENOUS | Status: AC
  Administered 2014-04-25: 1000 mg via INTRAVENOUS
  Filled 2014-04-25 (×2): qty 200

## 2014-04-25 MED ORDER — ONDANSETRON HCL 4 MG/2ML IJ SOLN
INTRAMUSCULAR | Status: AC
Start: 2014-04-25 — End: 2014-04-25
  Filled 2014-04-25: qty 2

## 2014-04-25 MED ORDER — ALUM & MAG HYDROXIDE-SIMETH 200-200-20 MG/5ML PO SUSP: 30.0000 mL | Freq: Four times a day (QID) | ORAL | Status: DC | PRN

## 2014-04-25 MED ORDER — PHENOL 1.4 % MT LIQD: 1.0000 | OROMUCOSAL | Status: DC | PRN

## 2014-04-25 MED ORDER — ACETAMINOPHEN 650 MG RE SUPP: 650.0000 mg | RECTAL | Status: DC | PRN

## 2014-04-25 MED ORDER — HYDROMORPHONE HCL 1 MG/ML IJ SOLN: 0.5000 mg | INTRAMUSCULAR | Status: DC | PRN

## 2014-04-25 MED ORDER — HYDROMORPHONE HCL 1 MG/ML IJ SOLN
INTRAMUSCULAR | Status: AC
  Administered 2014-04-25: 0.5 mg via INTRAVENOUS
  Filled 2014-04-25: qty 1

## 2014-04-25 MED ORDER — PHENYLEPHRINE 40 MCG/ML (10ML) SYRINGE FOR IV PUSH (FOR BLOOD PRESSURE SUPPORT)
PREFILLED_SYRINGE | INTRAVENOUS | Status: AC
  Filled 2014-04-25: qty 10

## 2014-04-25 MED ORDER — SODIUM CHLORIDE 0.9 % IV SOLN: 250.0000 mL | INTRAVENOUS | Status: DC

## 2014-04-25 MED ORDER — DEXTROSE 5 % IV SOLN
500.0000 mg | Freq: Four times a day (QID) | INTRAVENOUS | Status: DC | PRN
  Filled 2014-04-25: qty 5

## 2014-04-25 MED ORDER — MIDAZOLAM HCL 2 MG/2ML IJ SOLN
INTRAMUSCULAR | Status: AC
  Filled 2014-04-25: qty 2

## 2014-04-25 MED ORDER — ACETAMINOPHEN 325 MG PO TABS: 650.0000 mg | ORAL_TABLET | ORAL | Status: DC | PRN

## 2014-04-25 MED ORDER — HYDROMORPHONE HCL 1 MG/ML IJ SOLN
0.2500 mg | INTRAMUSCULAR | Status: DC | PRN
  Administered 2014-04-25 (×2): 0.5 mg via INTRAVENOUS

## 2014-04-25 MED ORDER — FENTANYL CITRATE 0.05 MG/ML IJ SOLN
INTRAMUSCULAR | Status: DC | PRN
  Administered 2014-04-25: 50 ug via INTRAVENOUS
  Administered 2014-04-25: 25 ug via INTRAVENOUS
  Administered 2014-04-25: 50 ug via INTRAVENOUS
  Administered 2014-04-25: 25 ug via INTRAVENOUS
  Administered 2014-04-25 (×2): 50 ug via INTRAVENOUS

## 2014-04-25 MED ORDER — LIDOCAINE-EPINEPHRINE 1 %-1:100000 IJ SOLN
INTRAMUSCULAR | Status: DC | PRN
  Administered 2014-04-25: 15 mL

## 2014-04-25 MED ORDER — PHENYLEPHRINE HCL 10 MG/ML IJ SOLN
INTRAMUSCULAR | Status: DC | PRN
  Administered 2014-04-25 (×2): 40 ug via INTRAVENOUS
  Administered 2014-04-25: 80 ug via INTRAVENOUS
  Administered 2014-04-25: 40 ug via INTRAVENOUS
  Administered 2014-04-25: 80 ug via INTRAVENOUS
  Administered 2014-04-25 (×2): 40 ug via INTRAVENOUS
  Administered 2014-04-25: 80 ug via INTRAVENOUS
  Administered 2014-04-25: 40 ug via INTRAVENOUS

## 2014-04-25 MED ORDER — ALBUMIN HUMAN 5 % IV SOLN
INTRAVENOUS | Status: DC | PRN
  Administered 2014-04-25 (×2): via INTRAVENOUS

## 2014-04-25 MED ORDER — ZOLPIDEM TARTRATE 5 MG PO TABS: 5.0000 mg | ORAL_TABLET | Freq: Every evening | ORAL | Status: DC | PRN

## 2014-04-25 MED ORDER — SENNOSIDES-DOCUSATE SODIUM 8.6-50 MG PO TABS: 1.0000 | ORAL_TABLET | Freq: Every evening | ORAL | Status: DC | PRN

## 2014-04-25 MED ORDER — SCOPOLAMINE 1 MG/3DAYS TD PT72
MEDICATED_PATCH | TRANSDERMAL | Status: DC | PRN
  Administered 2014-04-25: 1 via TRANSDERMAL

## 2014-04-25 MED ORDER — DEXAMETHASONE SODIUM PHOSPHATE 10 MG/ML IJ SOLN
INTRAMUSCULAR | Status: DC | PRN
  Administered 2014-04-25: 10 mg via INTRAVENOUS

## 2014-04-25 MED ORDER — FLEET ENEMA 7-19 GM/118ML RE ENEM: 1.0000 | ENEMA | Freq: Once | RECTAL | Status: AC | PRN

## 2014-04-25 MED ORDER — OXYCODONE HCL 5 MG/5ML PO SOLN: 5.0000 mg | Freq: Once | ORAL | Status: DC | PRN

## 2014-04-25 MED ORDER — PROPOFOL 10 MG/ML IV BOLUS
INTRAVENOUS | Status: AC
  Filled 2014-04-25: qty 20

## 2014-04-25 MED ORDER — KCL IN DEXTROSE-NACL 20-5-0.45 MEQ/L-%-% IV SOLN
INTRAVENOUS | Status: DC
  Administered 2014-04-25 – 2014-04-26 (×2): via INTRAVENOUS

## 2014-04-25 MED ORDER — DOCUSATE SODIUM 100 MG PO CAPS
100.0000 mg | ORAL_CAPSULE | Freq: Two times a day (BID) | ORAL | Status: DC
  Administered 2014-04-25 – 2014-04-28 (×4): 100 mg via ORAL
  Filled 2014-04-25 (×3): qty 1

## 2014-04-25 MED ORDER — METHOCARBAMOL 500 MG PO TABS
500.0000 mg | ORAL_TABLET | Freq: Four times a day (QID) | ORAL | Status: DC | PRN
  Administered 2014-04-26 – 2014-04-28 (×3): 500 mg via ORAL
  Filled 2014-04-25 (×3): qty 1

## 2014-04-25 MED ORDER — DIPHENHYDRAMINE HCL 50 MG/ML IJ SOLN
INTRAMUSCULAR | Status: DC | PRN
  Administered 2014-04-25: 12.5 mg via INTRAVENOUS

## 2014-04-25 MED ORDER — SODIUM CHLORIDE 0.9 % IJ SOLN: 3.0000 mL | INTRAMUSCULAR | Status: DC | PRN

## 2014-04-25 MED ORDER — DEXAMETHASONE SODIUM PHOSPHATE 10 MG/ML IJ SOLN
INTRAMUSCULAR | Status: AC
Start: 2014-04-25 — End: 2014-04-25
  Filled 2014-04-25: qty 1

## 2014-04-25 MED ORDER — 0.9 % SODIUM CHLORIDE (POUR BTL) OPTIME
TOPICAL | Status: DC | PRN
  Administered 2014-04-25: 1000 mL

## 2014-04-25 MED ORDER — SCOPOLAMINE 1 MG/3DAYS TD PT72
MEDICATED_PATCH | TRANSDERMAL | Status: AC
  Filled 2014-04-25: qty 1

## 2014-04-25 MED ORDER — SUCCINYLCHOLINE CHLORIDE 20 MG/ML IJ SOLN
INTRAMUSCULAR | Status: DC | PRN
  Administered 2014-04-25: 100 mg via INTRAVENOUS

## 2014-04-25 MED ORDER — FENTANYL CITRATE 0.05 MG/ML IJ SOLN
INTRAMUSCULAR | Status: AC
  Filled 2014-04-25: qty 5

## 2014-04-25 MED ORDER — MENTHOL 3 MG MT LOZG: 1.0000 | LOZENGE | OROMUCOSAL | Status: DC | PRN

## 2014-04-25 MED ORDER — ONDANSETRON HCL 4 MG/2ML IJ SOLN: 4.0000 mg | INTRAMUSCULAR | Status: DC | PRN

## 2014-04-25 MED ORDER — LIDOCAINE HCL (CARDIAC) 20 MG/ML IV SOLN
INTRAVENOUS | Status: DC | PRN
  Administered 2014-04-25: 100 mg via INTRAVENOUS

## 2014-04-25 MED ORDER — LACTATED RINGERS IV SOLN
INTRAVENOUS | Status: DC | PRN
  Administered 2014-04-25: 10:00:00 via INTRAVENOUS

## 2014-04-25 MED ORDER — PANTOPRAZOLE SODIUM 40 MG IV SOLR
40.0000 mg | Freq: Every day | INTRAVENOUS | Status: DC
  Administered 2014-04-25: 40 mg via INTRAVENOUS
  Filled 2014-04-25: qty 40

## 2014-04-25 MED ORDER — SUCCINYLCHOLINE CHLORIDE 20 MG/ML IJ SOLN
INTRAMUSCULAR | Status: AC
Start: 2014-04-25 — End: 2014-04-25
  Filled 2014-04-25: qty 1

## 2014-04-25 SURGICAL SUPPLY — 71 items
BENZOIN TINCTURE PRP APPL 2/3 (GAUZE/BANDAGES/DRESSINGS) ×3 IMPLANT
CLIP NEUROVISION LG (CLIP) ×3 IMPLANT
CLOSURE WOUND 1/2 X4 (GAUZE/BANDAGES/DRESSINGS)
CONT SPEC 4OZ CLIKSEAL STRL BL (MISCELLANEOUS) ×6 IMPLANT
COVER BACK TABLE 24X17X13 BIG (DRAPES) IMPLANT
COVER BACK TABLE 60X90IN (DRAPES) ×3 IMPLANT
DECANTER SPIKE VIAL GLASS SM (MISCELLANEOUS) ×3 IMPLANT
DRAPE C-ARM 42X72 X-RAY (DRAPES) ×3 IMPLANT
DRAPE C-ARMOR (DRAPES) ×3 IMPLANT
DRAPE LAPAROTOMY 100X72X124 (DRAPES) ×3 IMPLANT
DRAPE POUCH INSTRU U-SHP 10X18 (DRAPES) ×3 IMPLANT
DRAPE SURG 17X23 STRL (DRAPES) ×3 IMPLANT
DRSG OPSITE POSTOP 3X4 (GAUZE/BANDAGES/DRESSINGS) ×3 IMPLANT
DRSG OPSITE POSTOP 4X8 (GAUZE/BANDAGES/DRESSINGS) ×6 IMPLANT
DRSG TELFA 3X8 NADH (GAUZE/BANDAGES/DRESSINGS) ×3 IMPLANT
DURAPREP 26ML APPLICATOR (WOUND CARE) ×3 IMPLANT
ELECT REM PT RETURN 9FT ADLT (ELECTROSURGICAL) ×3
ELECTRODE REM PT RTRN 9FT ADLT (ELECTROSURGICAL) ×1 IMPLANT
GAUZE SPONGE 4X4 12PLY STRL (GAUZE/BANDAGES/DRESSINGS) ×3 IMPLANT
GAUZE SPONGE 4X4 16PLY XRAY LF (GAUZE/BANDAGES/DRESSINGS) ×6 IMPLANT
GLOVE BIO SURGEON STRL SZ8 (GLOVE) ×6 IMPLANT
GLOVE BIOGEL PI IND STRL 7.0 (GLOVE) ×2 IMPLANT
GLOVE BIOGEL PI IND STRL 8 (GLOVE) ×1 IMPLANT
GLOVE BIOGEL PI IND STRL 8.5 (GLOVE) ×2 IMPLANT
GLOVE BIOGEL PI INDICATOR 7.0 (GLOVE) ×4
GLOVE BIOGEL PI INDICATOR 8 (GLOVE) ×2
GLOVE BIOGEL PI INDICATOR 8.5 (GLOVE) ×4
GLOVE ECLIPSE 8.0 STRL XLNG CF (GLOVE) ×3 IMPLANT
GLOVE EXAM NITRILE LRG STRL (GLOVE) IMPLANT
GLOVE EXAM NITRILE MD LF STRL (GLOVE) IMPLANT
GLOVE EXAM NITRILE XL STR (GLOVE) IMPLANT
GLOVE EXAM NITRILE XS STR PU (GLOVE) IMPLANT
GLOVE SURG SS PI 7.0 STRL IVOR (GLOVE) ×6 IMPLANT
GOWN STRL REUS W/ TWL LRG LVL3 (GOWN DISPOSABLE) IMPLANT
GOWN STRL REUS W/ TWL XL LVL3 (GOWN DISPOSABLE) ×2 IMPLANT
GOWN STRL REUS W/TWL 2XL LVL3 (GOWN DISPOSABLE) ×3 IMPLANT
GOWN STRL REUS W/TWL LRG LVL3 (GOWN DISPOSABLE)
GOWN STRL REUS W/TWL XL LVL3 (GOWN DISPOSABLE) ×4
GUIDEWIRE NITINOL BEVEL TIP (WIRE) ×3 IMPLANT
KIT BASIN OR (CUSTOM PROCEDURE TRAY) ×3 IMPLANT
KIT NEEDLE NVM5 EMG ELECT (KITS) ×1 IMPLANT
KIT NEEDLE NVM5 EMG ELECTRODE (KITS) ×2
KIT POSITION SURG JACKSON T1 (MISCELLANEOUS) ×3 IMPLANT
KIT ROOM TURNOVER OR (KITS) ×3 IMPLANT
LIQUID BAND (GAUZE/BANDAGES/DRESSINGS) ×3 IMPLANT
MARKER SKIN DUAL TIP RULER LAB (MISCELLANEOUS) ×3 IMPLANT
NEEDLE HYPO 25X1 1.5 SAFETY (NEEDLE) ×3 IMPLANT
NEEDLE I PASS (NEEDLE) ×6 IMPLANT
NS IRRIG 1000ML POUR BTL (IV SOLUTION) ×3 IMPLANT
PACK LAMINECTOMY NEURO (CUSTOM PROCEDURE TRAY) ×3 IMPLANT
PAD ARMBOARD 7.5X6 YLW CONV (MISCELLANEOUS) ×9 IMPLANT
PATTIES SURGICAL .5 X.5 (GAUZE/BANDAGES/DRESSINGS) IMPLANT
PATTIES SURGICAL .5 X1 (DISPOSABLE) IMPLANT
PATTIES SURGICAL 1X1 (DISPOSABLE) IMPLANT
ROD RELINE LORDOTIC 5.5X140 (Rod) ×3 IMPLANT
SCREW LOCK RELINE 5.5 TULIP (Screw) ×33 IMPLANT
SCREW MAS RELINE 6.5X45 POLY (Screw) ×12 IMPLANT
SCREW MAS RELINE POLY 6.5X40 (Screw) ×21 IMPLANT
SPONGE LAP 4X18 X RAY DECT (DISPOSABLE) IMPLANT
STAPLER SKIN PROX WIDE 3.9 (STAPLE) IMPLANT
STRIP CLOSURE SKIN 1/2X4 (GAUZE/BANDAGES/DRESSINGS) IMPLANT
SUT VIC AB 1 CT1 18XBRD ANBCTR (SUTURE) ×2 IMPLANT
SUT VIC AB 1 CT1 8-18 (SUTURE) ×4
SUT VIC AB 2-0 CT1 18 (SUTURE) ×6 IMPLANT
SUT VIC AB 3-0 SH 8-18 (SUTURE) ×9 IMPLANT
SYR 20ML ECCENTRIC (SYRINGE) ×3 IMPLANT
SYR INSULIN 1ML 31GX6 SAFETY (SYRINGE) IMPLANT
TOWEL OR 17X24 6PK STRL BLUE (TOWEL DISPOSABLE) ×3 IMPLANT
TOWEL OR 17X26 10 PK STRL BLUE (TOWEL DISPOSABLE) ×3 IMPLANT
TRAY FOLEY CATH 14FRSI W/METER (CATHETERS) ×3 IMPLANT
WATER STERILE IRR 1000ML POUR (IV SOLUTION) ×3 IMPLANT

## 2014-04-25 NOTE — Progress Notes (Signed)
Pt returned from PACU with Ronda,PACU RN in stretcher.pt transferred to bed.  Pt tolerated well.  No acute distress noted.assessment performed as charted.  Report recd from [place], PACU RN.    Angeline Slim I 04/25/2014 2:20 PM

## 2014-04-25 NOTE — H&P (View-Only) (Signed)
Awake, alert, conversant.  MAEW.  Doing well.

## 2014-04-25 NOTE — Anesthesia Postprocedure Evaluation (Signed)
Anesthesia Post Note  Patient: [name]  Procedure(s) Performed: Procedure(s) (LRB): **Stage 2** Percutaneous pedicle screw placement L1-5 (N/A)  Anesthesia type: General  Patient location: PACU  Post pain: Pain level controlled and Adequate analgesia  Post assessment: Post-op Vital signs reviewed, Patient's Cardiovascular Status Stable, Respiratory Function Stable, Patent Airway and Pain level controlled  Last Vitals:  Filed Vitals:   04/25/14 1353  BP:   Pulse: 83  Temp: 37.1 C  Resp: 17    Post vital signs: Reviewed and stable  Level of consciousness: awake, alert  and oriented  Complications: No apparent anesthesia complications

## 2014-04-25 NOTE — Progress Notes (Signed)
Subjective: Patient reports "I don't think I was confused...maybe just anxious" "My back has hurt a lot"  Objective: Vital signs in last 24 hours: Temp:  [98.2 F (36.8 C)-98.9 F (37.2 C)] 98.7 F (37.1 C) (11/02 0526) Pulse Rate:  [77-85] 77 (11/02 0526) Resp:  [18-20] 18 (11/02 0526) BP: (130-150)/(49-70) 142/70 mmHg (11/02 0526) SpO2:  [96 %-98 %] 96 % (11/02 0526)  Intake/Output from previous day: 11/01 0701 - 11/02 0700 In: 3 [I.V.:3] Out: -  Intake/Output this shift:    Alert and conversant, initially without evidence of confusion.  Daughter at bedside reporting episodic confusion, aggitation, and delerium (consistent with notes from weekend). Apparently, Hydrocodone causes agitation, as she has calmed significantly overnight using only Dilaudid. Strength is good BLE. Bruising as expected left flank. Incisions without erythema, swelling, or drainage. Lumbar pain with ROM. Belly nondistended but firm, tender all quadrants. No BS to auscultation before or after palpation. No BM yet.   She has been OOB with PT in room over weekend.  Lab Results:  Recent Labs  04/24/14 1045  WBC 11.6*  HGB 9.6*  HCT 30.2*  PLT 235   BMET  Recent Labs  04/24/14 1045  NA 138  K 4.1  CL 100  CO2 28  GLUCOSE 104*  BUN 10  CREATININE 0.62  CALCIUM 9.6    Studies/Results: Dg Thoracolumbar Erect  04/25/2014   CLINICAL DATA:  Scoliosis.  EXAM: THORACOLUMBAR SCOLIOSIS STUDY - STANDING VIEWS  COMPARISON:  MRI lumbar spine 12/11/2013.  FINDINGS: Sacrum is not well imaged on the lateral view. Numbering of lumbar vertebrae is difficult. Lumbar vertebra are numbered with the lowest apparent segmented vertebra as L5 and highest non ribbed vertebrae scratched as L1. Prior lumbar fusion L1 through S1. Normal alignment on lateral view. Scoliosis of the lumbar spine 20 degrees concave left is present. There is a mild T12 and L1 compression fracture. These appear new from prior MRI lumbar spine  12/11/2013. Surgical clips right upper quadrant. Mild colonic distention is noted. Air-fluid levels are noted in the colon. Stool is present in the rectum. Prior cholecystectomy . Aortic atherosclerotic vascular disease.  IMPRESSION: 1. New mild compression fractures T12 and L1. 2. Lumbar scoliosis, 20 degrees concave left. Prior lumbar fusion L1 through S1. 3. Mild colonic distention.  Stool is noted in the rectum. 4. Aortic atherosclerotic vascular disease.   Electronically Signed   By: Marcello Moores  Register   On: 04/25/2014 07:32   Dg Chest Port 1 View  04/24/2014   CLINICAL DATA:  Postoperative complication.  EXAM: PORTABLE CHEST - 1 VIEW  COMPARISON:  04/20/2014  FINDINGS: Heart size and vascularity are normal. Negative for heart failure. Negative for pneumonia or effusion. Lungs remain clear.  IMPRESSION: No active disease.   Electronically Signed   By: Franchot Gallo M.D.   On: 04/24/2014 09:06    Assessment/Plan:   LOS: 3 days  Plan reamins for second stage surgery this morning (percutaneous screws and rods L1-5). Pt & daughter verbalize understanding and wish to proceed.  Dr  Vertell Limber will evaluate and discuss again in Neuro OR Holding Area.   Verdis Prime 04/25/2014, 8:01 AM

## 2014-04-25 NOTE — Op Note (Signed)
04/22/2014 - 04/25/2014  12:22 PM  PATIENT:  [name]  68 y.o. female  PRE-OPERATIVE DIAGNOSIS:  Scoliosis, Stenosis, HNP, Radiculopathy, low back pain  POST-OPERATIVE DIAGNOSIS:  Scoliosis, Stenosis, HNP, Radiculopathy, low back pain  PROCEDURE:  Procedure(s) with comments: **Stage 2** Percutaneous pedicle screw placement L 1-S 1 (N/A) - **Stage 2** Percutaneous pedicle screw placement L 1-S 1  SURGEON:  Surgeon(s) and Role:    * Erline Levine, MD - Primary  PHYSICIAN ASSISTANT:   ASSISTANTS: [place], RN   ANESTHESIA:   general  EBL:  Total I/O In: 1750 [I.V.:1500; IV Piggyback:250] Out: 350 [Urine:300; Blood:50]  BLOOD ADMINISTERED:none  DRAINS: none   LOCAL MEDICATIONS USED:  LIDOCAINE   SPECIMEN:  No Specimen  DISPOSITION OF SPECIMEN:  N/A  COUNTS:  YES  TOURNIQUET:  * No tourniquets in log *  DICTATION: After the smooth and uncomplicated induction of anesthesia and placement of neural monitoring, the patient was then turned into a prone position on the [place] table on chest rolls and using AP and lateral fluoroscopy throughout this portion of the procedure, pedicle screws were placed using Nuvasive cannulated percutaneous screws. After placing guide wires at each level with the use of nerve monitoring throughout.  Pedicle screws were placed,  2 at L 1 (6.5 x 40), Left L 2 (6.5 x 45), 2 at L 3 (6.5 40 Right and 6.5 x 45 Left), 2 at L 4 (6.5 x 45), 2 at  L 5 (6.5 x 40), and S1 (6.5 x 40 at each level).150 mm rods were then bent using Bendini rod contouring system and affixed to the screw heads through a separate stab incision and locked down on the screws. All connections were then torqued and the Towers were disassembled. The wounds were irrigated and then closed with 1, 2-0 and 3-0 Vicryl stitches. Sterile occlusive dressing was placed with Dermabond and occlusive dressings. The patient was then extubated in the operating room and taken to recovery in stable and  satisfactory condition having tolerated her operation well. Counts were correct at the end of the case.  PLAN OF CARE: Admit to inpatient   PATIENT DISPOSITION:  PACU - hemodynamically stable.   Delay start of Pharmacological VTE agent (>24hrs) due to surgical blood loss or risk of bleeding: yes

## 2014-04-25 NOTE — Anesthesia Preprocedure Evaluation (Signed)
Anesthesia Evaluation  Patient identified by MRN, date of birth, ID band Patient awake    Reviewed: Allergy & Precautions, H&P , NPO status , Patient's Chart, lab work & pertinent test results  Airway Mallampati: II   Neck ROM: full    Dental   Pulmonary asthma , former smoker,          Cardiovascular hypertension,     Neuro/Psych Anxiety Depression    GI/Hepatic hiatal hernia, PUD, GERD-  ,  Endo/Other  Hypothyroidism   Renal/GU      Musculoskeletal  (+) Arthritis -,   Abdominal   Peds  Hematology   Anesthesia Other Findings   Reproductive/Obstetrics                             Anesthesia Physical Anesthesia Plan  ASA: II  Anesthesia Plan: General   Post-op Pain Management:    Induction: Intravenous  Airway Management Planned: Oral ETT  Additional Equipment:   Intra-op Plan:   Post-operative Plan: Extubation in OR  Informed Consent: I have reviewed the patients History and Physical, chart, labs and discussed the procedure including the risks, benefits and alternatives for the proposed anesthesia with the patient or authorized representative who has indicated his/her understanding and acceptance.     Plan Discussed with: CRNA, Anesthesiologist and Surgeon  Anesthesia Plan Comments:         Anesthesia Quick Evaluation

## 2014-04-25 NOTE — Interval H&P Note (Signed)
History and Physical Interval Note:  04/25/2014 7:16 AM  [name]  has presented today for surgery, with the diagnosis of Scoliosis, Stenosis, HNP, Radiculopathy  The various methods of treatment have been discussed with the patient and family. After consideration of risks, benefits and other options for treatment, the patient has consented to  Procedure(s) with comments: **Stage 2** Percutaneous pedicle screw placement L1-5 (N/A) - **Stage 2** Percutaneous pedicle screw placement L1-5 as a surgical intervention .  The patient's history has been reviewed, patient examined, no change in status, stable for surgery.  I have reviewed the patient's chart and labs.  Questions were answered to the patient's satisfaction.     [name]

## 2014-04-25 NOTE — Progress Notes (Signed)
Pt's husband arrived and bedside and nurse informed of pt's behavior.  Pt's husband providing emotional support. Will monitor.   [name] I   04/25/2014   6:21 PM

## 2014-04-25 NOTE — Progress Notes (Signed)
Pharmacy: vancomycin  68 yo female s/p spinal fusion on 04/25/14 and pharmacy to does vancomycin x1 post-op (no drain in place) -SCr= 0.62, CrCl ~ 65, wt= 70.1kg -vancomycin 1000mg  IV given at 9:48am today  Plan -vancomycin 1000mg  IV x1 at 10pm today -Will sign off, please call pharmacy with any other needs  Hildred Laser, Pharm D 04/25/2014 2:35 PM

## 2014-04-25 NOTE — Brief Op Note (Signed)
04/22/2014 - 04/25/2014  12:22 PM  PATIENT:  [name]  68 y.o. female  PRE-OPERATIVE DIAGNOSIS:  Scoliosis, Stenosis, HNP, Radiculopathy, low back pain  POST-OPERATIVE DIAGNOSIS:  Scoliosis, Stenosis, HNP, Radiculopathy, low back pain  PROCEDURE:  Procedure(s) with comments: **Stage 2** Percutaneous pedicle screw placement L 1-S 1 (N/A) - **Stage 2** Percutaneous pedicle screw placement L 1-S 1  SURGEON:  Surgeon(s) and Role:    * Erline Levine, MD - Primary  PHYSICIAN ASSISTANT:   ASSISTANTS: Poteat, RN   ANESTHESIA:   general  EBL:  Total I/O In: 1750 [I.V.:1500; IV Piggyback:250] Out: 350 [Urine:300; Blood:50]  BLOOD ADMINISTERED:none  DRAINS: none   LOCAL MEDICATIONS USED:  LIDOCAINE   SPECIMEN:  No Specimen  DISPOSITION OF SPECIMEN:  N/A  COUNTS:  YES  TOURNIQUET:  * No tourniquets in log *  DICTATION: After the smooth and uncomplicated induction of anesthesia and placement of neural monitoring, the patient was then turned into a prone position on the Curtiss table on chest rolls and using AP and lateral fluoroscopy throughout this portion of the procedure, pedicle screws were placed using Nuvasive cannulated percutaneous screws. After placing guide wires at each level with the use of nerve monitoring throughout.  Pedicle screws were placed,  2 at L 1 (6.5 x 40), Left L 2 (6.5 x 45), 2 at L 3 (6.5 40 Right and 6.5 x 45 Left), 2 at L 4 (6.5 x 45), 2 at  L 5 (6.5 x 40), and S1 (6.5 x 40 at each level).150 mm rods were then bent using Bendini rod contouring system and affixed to the screw heads through a separate stab incision and locked down on the screws. All connections were then torqued and the Towers were disassembled. The wounds were irrigated and then closed with 1, 2-0 and 3-0 Vicryl stitches. Sterile occlusive dressing was placed with Dermabond and occlusive dressings. The patient was then extubated in the operating room and taken to recovery in stable and  satisfactory condition having tolerated her operation well. Counts were correct at the end of the case.  PLAN OF CARE: Admit to inpatient   PATIENT DISPOSITION:  PACU - hemodynamically stable.   Delay start of Pharmacological VTE agent (>24hrs) due to surgical blood loss or risk of bleeding: yes

## 2014-04-25 NOTE — Progress Notes (Signed)
UR completed 

## 2014-04-25 NOTE — Progress Notes (Signed)
PT Cancellation Note  Patient Details Name: LESLEIGH HUGHSON MRN: [id number] DOB: [birth year]   Cancelled Treatment:    Reason Eval/Treat Not Completed: Patient at procedure or test/unavailable. Patient going for redo PLIF this AM. Will follow up tomorrow with reeval as order indicates.    Jacqualyn Posey 04/25/2014, 7:36 AM

## 2014-04-25 NOTE — Progress Notes (Signed)
Awake, alert, conversant.  MAEW.  Doing well.

## 2014-04-25 NOTE — Progress Notes (Signed)
1800 - pt is confused, alert and oriented to self only. Pt stated she was at a foot game and requested to sit on bleechers.  Pt attempting to get out of bed. Nurse oriented pt.  Nurse notified charge RN, Olivia Mackie, of situation and requesting one-on-one sitter.  Will closely monitor pt.   Angeline Slim I 04/25/2014 6:20 PM

## 2014-04-26 MED ORDER — DEXAMETHASONE 4 MG PO TABS
4.0000 mg | ORAL_TABLET | Freq: Two times a day (BID) | ORAL | Status: AC
  Administered 2014-04-26 – 2014-04-28 (×4): 4 mg via ORAL
  Filled 2014-04-26 (×5): qty 1

## 2014-04-26 MED ORDER — HYDROMORPHONE HCL 2 MG PO TABS
2.0000 mg | ORAL_TABLET | ORAL | Status: DC | PRN
  Administered 2014-04-26 – 2014-04-29 (×9): 4 mg via ORAL
  Filled 2014-04-26 (×9): qty 2

## 2014-04-26 NOTE — Transfer of Care (Signed)
Immediate Anesthesia Transfer of Care Note  Patient: [name]  Procedure(s) Performed: Procedure(s) with comments: **Stage 2** Percutaneous pedicle screw placement L1-5 (N/A) - **Stage 2** Percutaneous pedicle screw placement L1-5  Patient Location: PACU  Anesthesia Type:General  Level of Consciousness: awake, alert , oriented and sedated  Airway & Oxygen Therapy: Patient Spontanous Breathing and Patient connected to nasal cannula oxygen  Post-op Assessment: Report given to PACU RN, Post -op Vital signs reviewed and stable and Patient moving all extremities  Post vital signs: Reviewed and stable  Complications: No apparent anesthesia complications

## 2014-04-26 NOTE — Progress Notes (Signed)
Physical Therapy Re-Evaluation and Treatment Patient Details Name: [name] MRN: [id number] DOB: 1946/02/13 Today's Date: 04/26/2014    History of Present Illness 68 y.o female who underwent ALIF and XLIF for L1-S1 04-22-14.; returned to OR for stage II (posterior approach) surgery 04/25/14 (L1-S1 pedicle screws) Pt with post-op confusion after each surgery. PMHx- hip fx surgery 10/2013; Rt shoulder replacement; back surgery; anxiety; depression; ADD; OA    PT Comments    Pt remains confused, however mostly participates with tasks as requested. She denies any changes in sensation in her legs and had no specific complaints re: pain, although she did grimace with transitional movements. Due to confusion and slow progress, continue to feel she will need additional therapies prior to d/c home.   Follow Up Recommendations  SNF;Supervision/Assistance - 24 hour     Equipment Recommendations  None recommended by PT    Recommendations for Other Services       Precautions / Restrictions Precautions Precautions: Fall;Back Precaution Booklet Issued: No Precaution Comments: Pt easily agitated and would not engage in discussion re: back precautions Required Braces or Orthoses: Spinal Brace Spinal Brace: Thoracolumbosacral orthotic;Applied in sitting position Restrictions Weight Bearing Restrictions: No    Mobility  Bed Mobility Overal bed mobility: Needs Assistance;+2 for physical assistance Bed Mobility: Sit to Sidelying;Rolling Rolling: Max assist   Supine to sit: Mod assist;+2 for physical assistance   Sit to sidelying: Max assist;+2 for physical assistance General bed mobility comments: limited attention requiring repeated cuing and incr physical assist to mainatian back precautions  Transfers Overall transfer level: Needs assistance Equipment used: Rolling walker (2 wheeled) Transfers: Sit to/from Stand Sit to Stand: Min assist;+2 physical assistance         General  transfer comment: VC's for sequencing and assist with RW management. physical assist due to decr balance  Ambulation/Gait Ambulation/Gait assistance: Mod assist;+2 physical assistance;+2 safety/equipment Ambulation Distance (Feet): 10 Feet (seated rest, then 5 ft) Assistive device: Rolling walker (2 wheeled) Gait Pattern/deviations: Step-through pattern;Decreased stride length;Decreased weight shift to right;Decreased weight shift to left;Shuffle Gait velocity: decreased Gait velocity interpretation: <1.8 ft/sec, indicative of risk for recurrent falls General Gait Details: shuffling feet without fully clearing feet off floor; occasional max assist to advance LLE; constant cues for longer steps and to lift her feet; pt too weak to ambulate full distance back to chair from [place] nd chair brought to her   Stairs            Wheelchair Mobility    Modified Rankin (Stroke Patients Only)       Balance Overall balance assessment: Needs assistance Sitting-balance support: No upper extremity supported;Feet supported Sitting balance-Leahy Scale: Fair     Standing balance support: Bilateral upper extremity supported;During functional activity Standing balance-Leahy Scale: Zero Standing balance comment: Slight left lateral lean standing with RW                    Cognition Arousal/Alertness: Lethargic;Suspect due to medications Behavior During Therapy: Flat affect;Agitated Overall Cognitive Status: Impaired/Different from baseline Area of Impairment: Memory;Following commands;Safety/judgement;Awareness;Problem solving;Attention;Orientation Orientation Level: Situation;Time;Place Current Attention Level: Sustained Memory: Decreased recall of precautions Following Commands: Follows one step commands with increased time Safety/Judgement: Decreased awareness of safety;Decreased awareness of deficits Awareness:  (not yet intellectual with regards to current diagnosis) Problem  Solving: Slow processing;Decreased initiation;Difficulty sequencing;Requires verbal cues;Requires tactile cues General Comments: thought she just had hip surgery (which she had in May)    Exercises  General Comments General comments (skin integrity, edema, etc.): Kept her eyes closed most of session, although would answer questions and claimed she was not sleepy "just disgusted with all of you"      Pertinent Vitals/Pain Pain Assessment: Faces Faces Pain Scale: Hurts even more Pain Location: assumed back Pain Descriptors / Indicators: Grimacing Pain Intervention(s): Limited activity within patient's tolerance;Monitored during session;Repositioned    Home Living Family/patient expects to be discharged to:: Private residence Living Arrangements: Spouse/significant other Available Help at Discharge: Family Type of Home: House Home Access: Stairs to enter     Home Equipment: Grab bars - tub/shower;Shower seat - built in;Bedside commode;Walker - 2 wheels;Cane - single point Additional Comments: Pt lives locally in [place] with husband, but pt's [facility] [place] [facility] [place] [place]room [place] level and there are fewer stairs.    Prior Function Level of Independence: Independent with assistive device(s)          PT Goals (current goals can now be found in the care plan section) Acute Rehab PT Goals Patient Stated Goal: unable to state PT Goal Formulation: Patient unable to participate in goal setting Time For Goal Achievement: 05/03/14 Potential to Achieve Goals: Fair Progress towards PT goals:  (current goals still appropriate)    Frequency  Min 5X/week    PT Plan Current plan remains appropriate    Co-evaluation             End of Session Equipment Utilized During Treatment: Gait belt;Back brace Activity Tolerance: Patient limited by fatigue;Patient limited by lethargy Patient left: in chair;with call bell/phone within reach;with  nursing/sitter in room     Time: 1032-1100 PT Time Calculation (min): 28 min  Charges:  $Gait Training: 8-22 mins                    G Codes:      [name] May 11, 2014, 12:51 PM Pager [phone]

## 2014-04-26 NOTE — Clinical Social Work Psychosocial (Signed)
Clinical Social Work Department BRIEF PSYCHOSOCIAL ASSESSMENT 04/26/2014  Patient:  JANEQUA, KIPNIS     Account Number:  [id number]     Admit date:  04/22/2014  Clinical Social Worker:  Marciano Sequin  Date/Time:  04/26/2014 01:50 PM  Referred by:  RN  Date Referred:  04/26/2014 Referred for  SNF Placement   Other Referral:   Interview type:  Family Other interview type:   Pt is oriented to self. Husband Macey, [place] [phone] or [phone]    PSYCHOSOCIAL DATA Living Status:  HUSBAND Admitted from facility:   Level of care:   Primary support name:  Buel,Gary P. Primary support relationship to patient:  SPOUSE Degree of support available:   Strong Support System    CURRENT CONCERNS  Other Concerns:    SOCIAL WORK ASSESSMENT / PLAN CSW met the pt's husband Pearline Cables at bedside. CSW introduced self and purpose of the visit. CSW discussed the clinical team's recommendations for rehab. Dominica Severin acknowledged the pt needs rehab to heal from her surgery. Pearline Cables emphasized wanting the pt to go to [place]. CSW explained the SNF process with Pearline Cables. Pearline Cables acknowledged understanding the process. CSW provided the Pearline Cables with contact information for further questions. CSW will continue to follow this pt and assist with discharge as needed.   Assessment/plan status:  Psychosocial Support/Ongoing Assessment of Needs Other assessment/ plan:   Information/referral to community resources:    PATIENT'S/FAMILY'S RESPONSE TO PLAN OF CARE: Pt's husband Pearline Cables presented with a plesanted affect and mood. It appears that Pearline Cables is ready to make the appropriate accommodations for the pt. Pearline Cables provided great insight into the pt's current condition. The pt will tranition to [facility].    [place], MSW, [place]

## 2014-04-26 NOTE — Progress Notes (Signed)
Occupational Therapy Evaluation Patient Details Name: [name] MRN: [id number] DOB: March 22, 1946 Today's Date: 04/26/2014    History of Present Illness 68 y.o female who underwent ALIF and XLIF for L1-S1 04-22-14.; returned to OR for stage II (posterior approach) surgery 04/25/14 (L1-S1 pedicle screws) Pt with post-op confusion after each surgery. PMHx- hip fx surgery 10/2013; Rt shoulder replacement; back surgery; anxiety; depression; ADD; OA   Clinical Impression   Pt limited by confusion and pain today. Pt answering questions incorrectly and with unrelated responses. Pt's husband present during session and able to provide home and d/c information. Pt completing sit/stand transfer with min +2 level and bed mobility at max +2 assist level. Patient will benefit from skilled OT acutely to increase independence and safety with ADLS to allow discharge to SNF.     Follow Up Recommendations  SNF;Supervision/Assistance - 24 hour    Equipment Recommendations  None recommended by OT    Recommendations for Other Services       Precautions / Restrictions Precautions Precautions: Back Precaution Booklet Issued: No Precaution Comments: Pt easily agitated and would not engage in discussion re: back precautions Required Braces or Orthoses: Spinal Brace Spinal Brace: Thoracolumbosacral orthotic;Applied in sitting position Restrictions Weight Bearing Restrictions: No      Mobility Bed Mobility Overal bed mobility: Needs Assistance Bed Mobility: Rolling;Sit to Sidelying Rolling: Min assist     Sit to sidelying: Max assist;+2 for physical assistance General bed mobility comments: Pt in recliner upon OT arrival.  Transfers Overall transfer level: Needs assistance Equipment used: Rolling walker (2 wheeled) Transfers: Sit to/from Stand Sit to Stand: Min assist;+2 physical assistance         General transfer comment: Min +2 (A) to keep RW on floor. Verbal cues for sequencing and  safe hand placement.      Balance Overall balance assessment: Needs assistance Sitting-balance support: Feet supported;No upper extremity supported Sitting balance-Leahy Scale: Fair     Standing balance support: Bilateral upper extremity supported;During functional activity Standing balance-Leahy Scale: Poor Standing balance comment: Slight left lateral lean while standing with RW                            ADL Overall ADL's : Needs assistance/impaired                                     Functional mobility during ADLs: Minimal assistance;+2 for safety/equipment;Rolling walker General ADL Comments: Pt limited by confusion and back pain. Pt pulling up on RW during sit<>stand transfer and required (A) to keep it on floor. Pt shuffling feet while ambulating from recliner to bed      Vision                     Perception     Praxis      Pertinent Vitals/Pain Pain Assessment: Faces Faces Pain Scale: Hurts little more Pain Location: Low back wraps around to front Pain Descriptors / Indicators: Grimacing Pain Intervention(s): Limited activity within patient's tolerance;Monitored during session;Repositioned     Hand Dominance     Extremity/Trunk Assessment Upper Extremity Assessment Upper Extremity Assessment: Overall WFL for tasks assessed   Lower Extremity Assessment Lower Extremity Assessment: Defer to PT evaluation   Cervical / Trunk Assessment Cervical / Trunk Assessment: Normal   Communication Communication Communication: No difficulties   Cognition Arousal/Alertness: Lethargic;Suspect  due to medications Behavior During Therapy: Flat affect Overall Cognitive Status: Difficult to assess Area of Impairment: Attention;Memory;Following commands;Safety/judgement;Awareness;Problem solving Current Attention Level: Sustained Memory: Decreased recall of precautions;Decreased short-term memory Following Commands: Follows one step  commands inconsistently;Follows one step commands with increased time Safety/Judgement: Decreased awareness of safety Awareness: Emergent Problem Solving: Slow processing;Decreased initiation;Difficulty sequencing;Requires verbal cues;Requires tactile cues   General Comments       Exercises       Shoulder Instructions      Home Living Family/patient expects to be discharged to:: Private residence Living Arrangements: Spouse/significant other Available Help at Discharge: Family Type of Home: House Home Access: Stairs to enter           [employer] Shower/Tub: Occupational psychologist: Standard     Home Equipment: Grab bars - tub/shower;Shower seat - built in;Bedside commode;Walker - 2 wheels;Cane - single point   Additional Comments: Pt lives locally in [place] with husband, but pt's [place] [place]room [place] one level and there are fewer stairs.      Prior Functioning/Environment Level of Independence: Independent with assistive device(s)             OT Diagnosis: Generalized weakness;Acute pain   OT Problem List: Decreased strength;Decreased activity tolerance;Impaired balance (sitting and/or standing);Decreased coordination;Decreased safety awareness;Decreased knowledge of use of DME or AE;Decreased knowledge of precautions;Pain   OT Treatment/Interventions: Self-care/ADL training;Therapeutic exercise;DME and/or AE instruction;Therapeutic activities;Patient/family education;Balance training    OT Goals(Current goals can be found in the care plan section) Acute Rehab OT Goals Patient Stated Goal: unable to state OT Goal Formulation: Patient unable to participate in goal setting Time For Goal Achievement: 05/07/14 Potential to Achieve Goals: Good ADL Goals Pt Will Perform Lower Body Bathing: with adaptive equipment;with supervision;sit to/from stand Pt Will Perform Lower Body Dressing: with supervision;with  adaptive equipment;sit to/from stand Pt Will Transfer to Toilet: with supervision;ambulating;bedside commode Pt Will Perform Toileting - Clothing Manipulation and hygiene: with min assist;sit to/from stand Additional ADL Goal #1: Pt will independently verbalize 3/3 precautions 100% accurately.  OT Frequency: Min 2X/week   Barriers to D/C:            Co-evaluation              End of Session Equipment Utilized During Treatment: Gait belt;Rolling walker;Back brace Nurse Communication: Mobility status;Precautions  Activity Tolerance: Patient limited by pain Patient left: in bed;with call bell/phone within reach;with bed alarm set;with family/visitor present;with nursing/sitter in room   Time: 4680-3212 OT Time Calculation (min): 19 min Charges:    G-Codes:    Redmond Baseman 2014-05-09, 1:01 PM

## 2014-04-26 NOTE — Clinical Social Work Placement (Addendum)
Clinical Social Work Department CLINICAL SOCIAL WORK PLACEMENT NOTE 04/26/2014  Patient:  [name], [name]  Account Number:  [id number] Admit date:  04/22/2014  Clinical Social Worker:  Paulette Blanch Clementine Soulliere, LCSWA  Date/time:  04/26/2014 02:05 PM  Clinical Social Work is seeking post-discharge placement for this patient at the following level of care:   SKILLED NURSING   (*CSW will update this form in Epic as items are completed)   04/26/2014  Patient/family provided with [facility] Department of Clinical Social Work's list of facilities offering this level of care within the geographic area requested by the patient (or if unable, by the patient's family).  04/26/2014  Patient/family informed of their freedom to choose among providers that offer the needed level of care, that participate in Medicare, Medicaid or managed care program needed by the patient, have an available bed and are willing to accept the patient.  04/26/2014  Patient/family informed of [facility]' ownership interest in [facility], as well as of the fact that they are under no obligation to receive care at this facility.  PASARR submitted to EDS on 04/26/2014 PASARR number received on   FL2 transmitted to all facilities in geographic area requested by pt/family on  04/26/2014 FL2 transmitted to all facilities within larger geographic area on 04/26/2014  Patient informed that his/her managed care company has contracts with or will negotiate with  certain facilities, including the following:     Patient/family informed of bed offers received:  04/26/2014 Patient chooses bed at [facility]  Physician recommends and patient chooses bed at    Patient [facility] on 04/29/2014   Patient to be transferred to facility by [facility]  Patient and family notified of transfer on today  Name of family member notified: Pt and Pt's husband    The following physician request were entered in  Epic:   Additional Comments:   [place], MSW, [place]

## 2014-04-26 NOTE — Plan of Care (Signed)
Problem: Phase I Progression Outcomes Goal: Sutures/staples intact Outcome: Completed/Met Date Met:  04/26/14

## 2014-04-26 NOTE — Progress Notes (Signed)
Subjective: Patient reports "How can I rest with my back hurting? I'm sure they have ruined my surgery"  Objective: Vital signs in last 24 hours: Temp:  [97 F (36.1 C)-99 F (37.2 C)] 98.6 F (37 C) (11/03 0530) Pulse Rate:  [43-96] 76 (11/03 0530) Resp:  [15-19] 18 (11/03 0530) BP: (145-198)/(61-96) 145/69 mmHg (11/03 0530) SpO2:  [81 %-100 %] 100 % (11/03 0530)  Intake/Output from previous day: 11/02 0701 - 11/03 0700 In: 1753 [I.V.:1503; IV Piggyback:250] Out: 3250 [Urine:3200; Blood:50] Intake/Output this shift: Total I/O In: 80 [P.O.:80] Out: 300 [Urine:300]  Awake, aggitated and restless in bed. CNA present sitting with pt d/t confusion and recent attempts to get out of bed on her own. Pt reports "back pain" & motions chest to waist. Good strength BLE, turning herself in bed without assistance. Incisions without erythema, swelling, or drainage. Belly soft.   Lab Results:  Recent Labs  04/24/14 1045  WBC 11.6*  HGB 9.6*  HCT 30.2*  PLT 235   BMET  Recent Labs  04/24/14 1045  NA 138  K 4.1  CL 100  CO2 28  GLUCOSE 104*  BUN 10  CREATININE 0.62  CALCIUM 9.6    Studies/Results: Dg Thoracolumbar Erect  04/25/2014   CLINICAL DATA:  Scoliosis.  EXAM: THORACOLUMBAR SCOLIOSIS STUDY - STANDING VIEWS  COMPARISON:  MRI lumbar spine 12/11/2013.  FINDINGS: Sacrum is not well imaged on the lateral view. Numbering of lumbar vertebrae is difficult. Lumbar vertebra are numbered with the lowest apparent segmented vertebra as L5 and highest non ribbed vertebrae scratched as L1. Prior lumbar fusion L1 through S1. Normal alignment on lateral view. Scoliosis of the lumbar spine 20 degrees concave left is present. There is a mild T12 and L1 compression fracture. These appear new from prior MRI lumbar spine 12/11/2013. Surgical clips right upper quadrant. Mild colonic distention is noted. Air-fluid levels are noted in the colon. Stool is present in the rectum. Prior cholecystectomy  . Aortic atherosclerotic vascular disease.  IMPRESSION: 1. New mild compression fractures T12 and L1. 2. Lumbar scoliosis, 20 degrees concave left. Prior lumbar fusion L1 through S1. 3. Mild colonic distention.  Stool is noted in the rectum. 4. Aortic atherosclerotic vascular disease.   Electronically Signed   By: Marcello Moores  Register   On: 04/25/2014 07:32   Dg Lumbar Spine Complete  04/25/2014   CLINICAL DATA:  Lumbar scoliosis.  EXAM: LUMBAR SPINE - COMPLETE 4+ VIEW  COMPARISON:  January 24, 2014.  FINDINGS: Four intraoperative fluoroscopic images were obtained of the lumbar spine. These images demonstrate posterior fusion of L1-S1 with intrapedicular screw placement at all levels. Compression deformity is noted involving the L2 and L5 vertebral bodies which is stable. No spondylolisthesis is noted. Good alignment of vertebral bodies is noted.  IMPRESSION: Status post posterior fusion of L1-S1.   Electronically Signed   By: Sabino Dick M.D.   On: 04/25/2014 12:27   Dg C-arm 61-120 Min  04/25/2014   CLINICAL DATA:  Lumbar scoliosis.  EXAM: LUMBAR SPINE - COMPLETE 4+ VIEW  COMPARISON:  January 24, 2014.  FINDINGS: Four intraoperative fluoroscopic images were obtained of the lumbar spine. These images demonstrate posterior fusion of L1-S1 with intrapedicular screw placement at all levels. Compression deformity is noted involving the L2 and L5 vertebral bodies which is stable. No spondylolisthesis is noted. Good alignment of vertebral bodies is noted.  IMPRESSION: Status post posterior fusion of L1-S1.   Electronically Signed   By: Dionne Ano.D.  On: 04/25/2014 12:27    Assessment/Plan:   LOS: 4 days  Mobilize in LSO with PT today. Will monitor level of confusion (no Norco in 2 days, only Dilaudid IV).     Verdis Prime 04/26/2014, 9:36 AM

## 2014-04-27 ENCOUNTER — Encounter (HOSPITAL_COMMUNITY): Payer: Self-pay | Admitting: Neurosurgery

## 2014-04-27 NOTE — Progress Notes (Signed)
PT Cancellation Note  Patient Details Name: [name] MRN: [id number] DOB: 01-21-1946   Cancelled Treatment:    Reason Eval/Treat Not Completed: Medical issues which prohibited therapy;Patient declined, no reason specified. Attempted to see patient earlier this AM and patient declining bc of nausea. Attempted again this afternoon and patient frustrated due to being moved to different room and she is unable to locate her wedding rings. Patient refused ambulation this time as well. Will follow up in AM   Yorel Redder, Tonia Brooms 04/27/2014, 2:29 PM

## 2014-04-27 NOTE — Progress Notes (Signed)
Patient ID: [name], female   DOB: [birth year], 68 y.o.   MRN: [id number] Patient doing better this afternoon with less confusion  Strength out of 5 neurologically stable wound is clean dry and intact.  Continue mobilizes physical outpatient therapy agree with everything Mr. Poteet documented in his previous note.

## 2014-04-27 NOTE — Progress Notes (Signed)
Subjective: Patient reports "I don't know why I've been confused, but I have. I still can't remember where I am."  Objective: Vital signs in last 24 hours: Temp:  [97.9 F (36.6 C)-99 F (37.2 C)] 97.9 F (36.6 C) (11/04 0534) Pulse Rate:  [71-81] 81 (11/04 0534) Resp:  [17-20] 20 (11/04 0534) BP: (127-162)/(49-70) 157/70 mmHg (11/04 0534) SpO2:  [95 %-97 %] 96 % (11/04 0534)  Intake/Output from previous day: 11/03 0701 - 11/04 0700 In: 303 [P.O.:300; I.V.:3] Out: 1200 [Urine:1200] Intake/Output this shift:    Alert, sitting in cahir eating breakfast. LSO in use. Pt's affect very pleasant today, no longer aggitated or anxious, but confusion persists. [Confusion took 2 days to resolve after first stage surgery also.]  She is cooperative.  She denies leg pain, noting only lumbar pain & some abdominal tenderness on the left. Good strength BLE.  Lab Results:  Recent Labs  04/24/14 1045  WBC 11.6*  HGB 9.6*  HCT 30.2*  PLT 235   BMET  Recent Labs  04/24/14 1045  NA 138  K 4.1  CL 100  CO2 28  GLUCOSE 104*  BUN 10  CREATININE 0.62  CALCIUM 9.6    Studies/Results: Dg Lumbar Spine Complete  04/25/2014   CLINICAL DATA:  Lumbar scoliosis.  EXAM: LUMBAR SPINE - COMPLETE 4+ VIEW  COMPARISON:  January 24, 2014.  FINDINGS: Four intraoperative fluoroscopic images were obtained of the lumbar spine. These images demonstrate posterior fusion of L1-S1 with intrapedicular screw placement at all levels. Compression deformity is noted involving the L2 and L5 vertebral bodies which is stable. No spondylolisthesis is noted. Good alignment of vertebral bodies is noted.  IMPRESSION: Status post posterior fusion of L1-S1.   Electronically Signed   By: Sabino Dick M.D.   On: 04/25/2014 12:27   Dg C-arm 61-120 Min  04/25/2014   CLINICAL DATA:  Lumbar scoliosis.  EXAM: LUMBAR SPINE - COMPLETE 4+ VIEW  COMPARISON:  January 24, 2014.  FINDINGS: Four intraoperative fluoroscopic images were obtained  of the lumbar spine. These images demonstrate posterior fusion of L1-S1 with intrapedicular screw placement at all levels. Compression deformity is noted involving the L2 and L5 vertebral bodies which is stable. No spondylolisthesis is noted. Good alignment of vertebral bodies is noted.  IMPRESSION: Status post posterior fusion of L1-S1.   Electronically Signed   By: Sabino Dick M.D.   On: 04/25/2014 12:27    Assessment/Plan: Improving   LOS: 5 days  Continue to mobilize in LSO with PT. Contuinue to monitor confusion. Planning for [facility] upon discharge.    Verdis Prime 04/27/2014, 8:12 AM

## 2014-04-27 NOTE — Plan of Care (Signed)
Problem: Phase I Progression Outcomes Goal: OOB as tolerated unless otherwise ordered Outcome: Completed/Met Date Met:  04/27/14

## 2014-04-27 NOTE — Plan of Care (Signed)
Problem: Phase I Progression Outcomes Goal: Pain controlled with appropriate interventions Outcome: Completed/Met Date Met:  04/27/14     

## 2014-04-28 MED ORDER — CLONIDINE HCL 0.1 MG PO TABS
0.2000 mg | ORAL_TABLET | Freq: Two times a day (BID) | ORAL | Status: DC
  Administered 2014-04-28 – 2014-04-29 (×3): 0.2 mg via ORAL
  Filled 2014-04-28 (×3): qty 2

## 2014-04-28 NOTE — Progress Notes (Signed)
Physical Therapy Treatment Patient Details Name: [name] MRN: [id number] DOB: [birth year] Today's Date: 04/28/2014    History of Present Illness 68 y.o female who underwent ALIF and XLIF for L1-S1 04-22-14.; returned to OR for stage II (posterior approach) surgery 04/25/14 (L1-S1 pedicle screws) Pt with post-op confusion after each surgery. PMHx- hip fx surgery 10/2013; Rt shoulder replacement; back surgery; anxiety; depression; ADD; OA    PT Comments    Patient is progressing with ambulation today and able walk out in hallway. Patient with some impulsiveness with use of RW. Cues to focus on task and attempt one thing at a time. Continue to recommend SNF for ongoing Physical Therapy.     Follow Up Recommendations  SNF;Supervision/Assistance - 24 hour     Equipment Recommendations  None recommended by PT    Recommendations for Other Services       Precautions / Restrictions Precautions Precautions: Back Precaution Comments: Patient reeducated on all precautions as she could not recall initially Required Braces or Orthoses: Spinal Brace Spinal Brace: Thoracolumbosacral orthotic;Applied in sitting position Restrictions Weight Bearing Restrictions: No    Mobility  Bed Mobility               General bed mobility comments: Pt in chair on OT arrival  Transfers Overall transfer level: Needs assistance Equipment used: Rolling walker (2 wheeled) Transfers: Sit to/from Stand Sit to Stand: Min assist         General transfer comment: MIn A to steady stand and cues for positioning and hand placement prior to stand  Ambulation/Gait Ambulation/Gait assistance: Min assist Ambulation Distance (Feet): 250 Feet Assistive device: Rolling walker (2 wheeled) Gait Pattern/deviations: Step-to pattern;Decreased weight shift to right;Decreased weight shift to left Gait velocity: decreased   General Gait Details: shuffling feet without fully clearing feet off floor. Cues for  positioning within RW and A to avoid object/walls with RW.    Stairs            Wheelchair Mobility    Modified Rankin (Stroke Patients Only)       Balance Overall balance assessment: Needs assistance Sitting-balance support: No upper extremity supported;Feet supported Sitting balance-Leahy Scale: Fair     Standing balance support: Single extremity supported;During functional activity Standing balance-Leahy Scale: Poor Standing balance comment: Requires single or BUE support on RW at all times to sustain balance                    Cognition Arousal/Alertness: Awake/alert Behavior During Therapy: WFL for tasks assessed/performed Overall Cognitive Status: History of cognitive impairments - at baseline Area of Impairment: Safety/judgement;Awareness         Safety/Judgement: Decreased awareness of safety Awareness: Emergent   General Comments: Pt much more alert and oriented. Pt appears back to cognitive baseline.    Exercises      General Comments        Pertinent Vitals/Pain Pain Assessment: 0-10 Pain Score: 8  Pain Location: R hip and back Pain Descriptors / Indicators: Sore;Aching Pain Intervention(s): Monitored during session    Home Living                      Prior Function            PT Goals (current goals can now be found in the care plan section) Acute Rehab PT Goals Patient Stated Goal: to go home Progress towards PT goals: Progressing toward goals    Frequency  Min 5X/week  PT Plan Current plan remains appropriate    Co-evaluation             End of Session Equipment Utilized During Treatment: Gait belt;Back brace Activity Tolerance: Patient tolerated treatment well Patient left: in chair;with call bell/phone within reach     Time: 0819-0843 PT Time Calculation (min): 24 min  Charges:  $Gait Training: 23-37 mins                    G Codes:      Jacqualyn Posey 04/28/2014, 12:13  PM 04/28/2014 Jacqualyn Posey PTA [phone] pager [phone] office
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
## 2014-04-28 NOTE — Clinical Social Work Note (Signed)
CSW contacted Ivin Booty at [facility] to reconfirm bed offer. Ivin Booty reported that they are ready and they can accept the pt once she is medically ready. CSW will continue to follow and assist with needs.   [place], MSW, [place]

## 2014-04-28 NOTE — Progress Notes (Signed)
Subjective: Patient reports patient rolls were K feels, achy all over a nonfocal no new numbness or tingling in her legs concerned of blood pressures riding a little high but she is ambulating well to the bathroom.  Objective: Vital signs in last 24 hours: Temp:  [97.9 F (36.6 C)-99.1 F (37.3 C)] 98.1 F (36.7 C) (11/05 5621) Pulse Rate:  [71-80] 72 (11/05 0637) Resp:  [18-20] 18 (11/05 0637) BP: (134-171)/(56-81) 166/81 mmHg (11/05 0637) SpO2:  [96 %-98 %] 97 % (11/05 0637)  Intake/Output from previous day: 11/04 0701 - 11/05 0700 In: 6 [I.V.:6] Out: -  Intake/Output this shift:    continue with physical occupational therapy will start clonidine for blood pressure   Lab Results: No results for input(s): WBC, HGB, HCT, PLT in the last 72 hours. BMET No results for input(s): NA, K, CL, CO2, GLUCOSE, BUN, CREATININE, CALCIUM in the last 72 hours.  Studies/Results: No results found.  Assessment/Plan: Postoperative day 3 from posterior augmentation L1-L5 interbody fusion. Expected postoperative recovery generalized arthralgias myalgias this morning day 3 after surgery will start clonidine for blood pressure continue to work with physical and occupational therapy.   LOS: 6 days     Alexes Lamarque P 04/28/2014, 7:38 AM

## 2014-04-29 DIAGNOSIS — M6281 Muscle weakness (generalized): Secondary | ICD-10-CM | POA: Diagnosis not present

## 2014-04-29 DIAGNOSIS — D62 Acute posthemorrhagic anemia: Secondary | ICD-10-CM | POA: Diagnosis not present

## 2014-04-29 DIAGNOSIS — K219 Gastro-esophageal reflux disease without esophagitis: Secondary | ICD-10-CM | POA: Diagnosis not present

## 2014-04-29 DIAGNOSIS — K5190 Ulcerative colitis, unspecified, without complications: Secondary | ICD-10-CM | POA: Diagnosis not present

## 2014-04-29 DIAGNOSIS — J811 Chronic pulmonary edema: Secondary | ICD-10-CM | POA: Diagnosis not present

## 2014-04-29 DIAGNOSIS — M5416 Radiculopathy, lumbar region: Secondary | ICD-10-CM | POA: Diagnosis not present

## 2014-04-29 DIAGNOSIS — F411 Generalized anxiety disorder: Secondary | ICD-10-CM | POA: Diagnosis not present

## 2014-04-29 DIAGNOSIS — F329 Major depressive disorder, single episode, unspecified: Secondary | ICD-10-CM | POA: Diagnosis not present

## 2014-04-29 DIAGNOSIS — E785 Hyperlipidemia, unspecified: Secondary | ICD-10-CM | POA: Diagnosis not present

## 2014-04-29 DIAGNOSIS — D72829 Elevated white blood cell count, unspecified: Secondary | ICD-10-CM | POA: Diagnosis not present

## 2014-04-29 DIAGNOSIS — J4520 Mild intermittent asthma, uncomplicated: Secondary | ICD-10-CM | POA: Diagnosis not present

## 2014-04-29 DIAGNOSIS — F419 Anxiety disorder, unspecified: Secondary | ICD-10-CM | POA: Diagnosis not present

## 2014-04-29 DIAGNOSIS — E039 Hypothyroidism, unspecified: Secondary | ICD-10-CM | POA: Diagnosis not present

## 2014-04-29 DIAGNOSIS — Z981 Arthrodesis status: Secondary | ICD-10-CM | POA: Diagnosis not present

## 2014-04-29 DIAGNOSIS — F418 Other specified anxiety disorders: Secondary | ICD-10-CM | POA: Diagnosis not present

## 2014-04-29 DIAGNOSIS — M4806 Spinal stenosis, lumbar region: Secondary | ICD-10-CM | POA: Diagnosis not present

## 2014-04-29 DIAGNOSIS — B370 Candidal stomatitis: Secondary | ICD-10-CM | POA: Diagnosis not present

## 2014-04-29 DIAGNOSIS — M47816 Spondylosis without myelopathy or radiculopathy, lumbar region: Secondary | ICD-10-CM | POA: Diagnosis not present

## 2014-04-29 DIAGNOSIS — M4126 Other idiopathic scoliosis, lumbar region: Secondary | ICD-10-CM | POA: Diagnosis not present

## 2014-04-29 DIAGNOSIS — M419 Scoliosis, unspecified: Secondary | ICD-10-CM | POA: Diagnosis not present

## 2014-04-29 DIAGNOSIS — F900 Attention-deficit hyperactivity disorder, predominantly inattentive type: Secondary | ICD-10-CM | POA: Diagnosis not present

## 2014-04-29 DIAGNOSIS — F909 Attention-deficit hyperactivity disorder, unspecified type: Secondary | ICD-10-CM | POA: Diagnosis not present

## 2014-04-29 DIAGNOSIS — R260 Ataxic gait: Secondary | ICD-10-CM | POA: Diagnosis not present

## 2014-04-29 NOTE — Progress Notes (Signed)
Report called in to nurse Kathlee Nations at [facility].

## 2014-04-29 NOTE — Discharge Summary (Signed)
Physician Discharge Summary  Patient ID: [name] MRN: [id number] DOB/AGE: 68-24-1947 68 y.o.  Admit date: 04/22/2014 Discharge date: 04/29/2014  Admission Diagnoses:  Discharge Diagnoses:  Active Problems:   Lumbar spine scoliosis   Discharged Condition: good  Hospital Course: patient admitted to the hospital where she underwent an anterior-posterior lumbar fusion from L1-S1. Postoperative she is done reasonably well. She's had some difficulty with postoperative confusion which is cleared. She has been slow to mobilize. Plan is for discharge to skilled nursing facility for further convalescence. Currently patient is awake and alert. She is dissipating in therapy. Her pain level is improving. Her mobility is improving.  Consults:   Significant Diagnostic Studies:   Treatments:   Discharge Exam: Blood pressure 118/55, pulse 64, temperature 98.1 F (36.7 C), temperature source Oral, resp. rate 16, height 5' 6"  (1.676 m), weight 70.1 kg (154 lb 8.7 oz), SpO2 100 %. Awake and alert. Oriented and appropriate. Motor and sensory function stable. Wound clean dry and intact. Chest and abdomen benign.  Disposition: 01-Home or Self Care     Medication List    TAKE these medications        albuterol 108 (90 BASE) MCG/ACT inhaler  Commonly known as:  PROVENTIL HFA;VENTOLIN HFA  Inhale 1 puff into the lungs 4 (four) times daily as needed for wheezing or shortness of breath.     ALLERGY PO  Take 1 tablet by mouth daily.     ALPRAZolam 0.5 MG tablet  Commonly known as:  XANAX  Take 0.5 mg by mouth as needed for sleep.     ASACOL HD 800 MG Tbec  Generic drug:  Mesalamine  Take 2,400 mg by mouth 2 (two) times daily.     buPROPion 300 MG 24 hr tablet  Commonly known as:  WELLBUTRIN XL  Take 300 mg by mouth daily.     calcium citrate-vitamin D 315-200 MG-UNIT per tablet  Commonly known as:  CITRACAL+D  Take 1 tablet by mouth 2 (two) times daily.     etodolac 500 MG  tablet  Commonly known as:  LODINE  Take 500 mg by mouth 2 (two) times daily.     FLUoxetine 20 MG capsule  Commonly known as:  PROZAC  Take 60 mg by mouth daily.     ibuprofen 200 MG tablet  Commonly known as:  ADVIL,MOTRIN  Take 800 mg by mouth daily as needed (pain).     inFLIXimab 100 MG injection  Commonly known as:  REMICADE  Inject into the vein. Every 2 months     lamoTRIgine 200 MG tablet  Commonly known as:  LAMICTAL  Take 200 mg by mouth daily.     levothyroxine 100 MCG tablet  Commonly known as:  SYNTHROID, LEVOTHROID  Take 100 mcg by mouth daily before breakfast.     methylphenidate 27 MG CR tablet  Commonly known as:  CONCERTA  Take 27 mg by mouth daily.     OMEGA 3 PO  Take 1 capsule by mouth daily.     pantoprazole 40 MG tablet  Commonly known as:  PROTONIX  Take 40 mg by mouth 2 (two) times daily.     PROBIOTIC PO  Take 1 capsule by mouth daily.     promethazine 12.5 MG tablet  Commonly known as:  PHENERGAN  Take 1 tablet (12.5 mg total) by mouth every 6 (six) hours as needed for nausea or vomiting.     VITAMIN B-12 PO  Take 2 tablets  by mouth daily.     VITAMIN D PO  Take 1 tablet by mouth daily.         Signed: Sashay Felling A 04/29/2014, 1:50 PM
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
## 2014-05-02 ENCOUNTER — Other Ambulatory Visit: Payer: Self-pay | Admitting: *Deleted

## 2014-05-02 ENCOUNTER — Non-Acute Institutional Stay (SKILLED_NURSING_FACILITY): Payer: Medicare Other | Admitting: Internal Medicine

## 2014-05-02 ENCOUNTER — Encounter: Payer: Self-pay | Admitting: Internal Medicine

## 2014-05-02 DIAGNOSIS — E785 Hyperlipidemia, unspecified: Secondary | ICD-10-CM | POA: Diagnosis not present

## 2014-05-02 DIAGNOSIS — D72829 Elevated white blood cell count, unspecified: Secondary | ICD-10-CM | POA: Diagnosis not present

## 2014-05-02 DIAGNOSIS — D62 Acute posthemorrhagic anemia: Secondary | ICD-10-CM | POA: Diagnosis not present

## 2014-05-02 DIAGNOSIS — F418 Other specified anxiety disorders: Secondary | ICD-10-CM | POA: Diagnosis not present

## 2014-05-02 DIAGNOSIS — M419 Scoliosis, unspecified: Secondary | ICD-10-CM

## 2014-05-02 DIAGNOSIS — K589 Irritable bowel syndrome without diarrhea: Secondary | ICD-10-CM | POA: Insufficient documentation

## 2014-05-02 DIAGNOSIS — J4520 Mild intermittent asthma, uncomplicated: Secondary | ICD-10-CM | POA: Diagnosis not present

## 2014-05-02 DIAGNOSIS — M4126 Other idiopathic scoliosis, lumbar region: Secondary | ICD-10-CM

## 2014-05-02 DIAGNOSIS — E039 Hypothyroidism, unspecified: Secondary | ICD-10-CM | POA: Diagnosis not present

## 2014-05-02 DIAGNOSIS — B370 Candidal stomatitis: Secondary | ICD-10-CM | POA: Diagnosis not present

## 2014-05-02 DIAGNOSIS — K219 Gastro-esophageal reflux disease without esophagitis: Secondary | ICD-10-CM

## 2014-05-02 MED ORDER — METHYLPHENIDATE HCL ER (OSM) 27 MG PO TBCR: EXTENDED_RELEASE_TABLET | ORAL | Status: DC

## 2014-05-02 NOTE — Telephone Encounter (Signed)
[facility] 

## 2014-05-02 NOTE — Progress Notes (Signed)
Patient ID: [name], female   DOB: 05-Mar-1946, 68 y.o.   MRN: [id number]     [facility]   PCP: Olga Millers, MD  Code Status: full code  Allergies  Allergen Reactions  . Ampicillin Nausea And Vomiting  . Erythromycin Nausea And Vomiting  . Morphine And Related Nausea And Vomiting    Chief Complaint  Patient presents with  . New Admit To SNF     HPI:  68 y/o female patient is here for STR after hospital admission from 04/22/2014- 04/29/2014 with lumbar spine scoliosis and underwent L1-S1 fusion. She is seen in her room today. Her pain is under control and 2/10 at present. She has loose stools and mentions this is normal for her with her hx of IBS. She mentions having discomfort in her mouth and is concerned for a thrush. Denies dysphagia or odynophagia. She feels tired but mentions feeling better than on the day of admission. Has been working with therapy team. No other concerns.  She has PMH of HTN, asthma, GERD, DDD among others  Review of Systems:  Constitutional: Negative for fever, chills, diaphoresis.  HENT: Negative for congestion   Eyes: Negative for eye pain, blurred vision, double vision and discharge.  Respiratory: Negative for cough, sputum production, shortness of breath and wheezing.   Cardiovascular: Negative for chest pain, palpitations, orthopnea and leg swelling.  Gastrointestinal: Negative for heartburn, nausea, vomiting, abdominal pain. Poor appetite Genitourinary: Negative for dysuria Musculoskeletal: Negative for falls. Using wheelchair and walker with assistance only Skin: Negative for itching, sores and rash.  Neurological: Negative for dizziness, tingling, focal weakness and headaches.  Psychiatric/Behavioral: Negative for depression  Past Medical History  Diagnosis Date  . Anxiety   . Depression   . Thyroid disease   . Allergy   . GERD (gastroesophageal reflux disease)   . IBS (irritable bowel  syndrome)   . Ulcerative colitis   . Attention deficit disorder without mention of hyperactivity   . Unspecified asthma(493.90)   . Osteoarthrosis, unspecified whether generalized or localized, unspecified site   . Other and unspecified hyperlipidemia   . Diverticulosis   . Colitis 12/05/2010  . Unspecified essential hypertension     taken off meds since lost wt  . H/O hiatal hernia    Past Surgical History  Procedure Laterality Date  . Colonoscopy    . Upper gastrointestinal endoscopy    . Back surgery      fluid removed from between disks  . Shoulder surgery      right shoulder 3x   . Total shoulder arthroplasty    . Toe surgery      right big toe  . Breast surgery      bil breast reduction   . Hip fracture surgery      left  11/01/2013  . Cholecystectomy      Laparoscopic cholecystectomy  . Anterior lumbar fusion N/A 04/22/2014    Procedure: Lumbar five/-Sacral one. Anterior lumbar interbody fusion with Dr. Scot Dock; Left Lumbar one/two, two/three/three/four, four/five. Anterior lateral lumbar interbody fusion**Stage 1**;  Surgeon: Erline Levine, MD;  Location: [place] [facility];  Service: Neurosurgery;  Laterality: N/A;  . Anterior lateral lumbar fusion 4 levels Left 04/22/2014    Procedure: Left Lumbar one/two, two/three, three/four, four,five. Anterior lateral lumbar interbody fusion**Stage 1**;  Surgeon: Erline Levine, MD;  Location: [place] [facility];  Service: Neurosurgery;  Laterality: Left;  . Abdominal exposure N/A 04/22/2014    Procedure: ABDOMINAL EXPOSURE;  Surgeon: Angelia Mould, MD;  Location: [facility];  Service: Vascular;  Laterality: N/A;  . Lumbar percutaneous pedicle screw 4 level N/A 04/25/2014    Procedure: **Stage 2** Percutaneous pedicle screw placement L1-5;  Surgeon: Erline Levine, MD;  Location: [place] [facility];  Service: Neurosurgery;  Laterality: N/A;  **Stage 2** Percutaneous pedicle screw placement L1-5   Social History:   reports that she quit smoking  about 26 years ago. She has never used smokeless tobacco. She reports that she drinks about 0.5 oz of alcohol per week. She reports that she does not use illicit drugs.  Family History  Problem Relation Age of Onset  . Hypertension Father   . Colon cancer Father 84  . Arthritis Father   . Heart disease Father     CHF  . Diabetes Neg Hx   . Heart disease Mother     Died, 26  . Healthy Brother   . Healthy Daughter     Medications: Patient's Medications  New Prescriptions   No medications on file  Previous Medications   ALBUTEROL (PROVENTIL HFA;VENTOLIN HFA) 108 (90 BASE) MCG/ACT INHALER    Inhale 1 puff into the lungs 4 (four) times daily as needed for wheezing or shortness of breath.   ALPRAZOLAM (XANAX) 0.5 MG TABLET    Take 0.5 mg by mouth as needed for sleep.   BUPROPION (WELLBUTRIN XL) 300 MG 24 HR TABLET    Take 300 mg by mouth daily.   CALCIUM CITRATE-VITAMIN D (CITRACAL+D) 315-200 MG-UNIT PER TABLET    Take 1 tablet by mouth 2 (two) times daily.   CHLORPHENIRAMINE MALEATE (ALLERGY PO)    Take 1 tablet by mouth daily.   CHOLECALCIFEROL (VITAMIN D PO)    Take 1 tablet by mouth daily.   CYANOCOBALAMIN (VITAMIN B-12 PO)    Take 2 tablets by mouth daily.   ETODOLAC (LODINE) 500 MG TABLET    Take 500 mg by mouth 2 (two) times daily.   FLUOXETINE (PROZAC) 20 MG CAPSULE    Take 60 mg by mouth daily.   IBUPROFEN (ADVIL,MOTRIN) 200 MG TABLET    Take 800 mg by mouth daily as needed (pain).   INFLIXIMAB (REMICADE) 100 MG INJECTION    Inject into the vein. Every 2 months   LAMOTRIGINE (LAMICTAL) 200 MG TABLET    Take 200 mg by mouth daily.     LEVOTHYROXINE (SYNTHROID, LEVOTHROID) 100 MCG TABLET    Take 100 mcg by mouth daily before breakfast.   MESALAMINE (ASACOL HD) 800 MG TBEC    Take 2,400 mg by mouth 2 (two) times daily.   METHYLPHENIDATE 27 MG PO CR TABLET    Take one tablet by mouth once daily for ADHD. Do Not Crush   OMEGA-3 FATTY ACIDS (OMEGA 3 PO)    Take 1 capsule by mouth  daily.   PANTOPRAZOLE (PROTONIX) 40 MG TABLET    Take 40 mg by mouth 2 (two) times daily.   PROBIOTIC PRODUCT (PROBIOTIC PO)    Take 1 capsule by mouth daily.   PROMETHAZINE (PHENERGAN) 12.5 MG TABLET    Take 1 tablet (12.5 mg total) by mouth every 6 (six) hours as needed for nausea or vomiting.  Modified Medications   No medications on file  Discontinued Medications   No medications on file     Physical Exam: Filed Vitals:   05/02/14 0940  BP: 148/76  Pulse: 78  Temp: 98.8 F (37.1 C)  Resp: 18  Weight: 154  lb (69.854 kg)  SpO2: 98%    General- elderly female in no acute distress Head- atraumatic, normocephalic Eyes- PERRLA, EOMI, no pallor, no icterus, no discharge Neck- no cervical lymphadenopathy Throat- moist mucus membrane, has white coating in her tongue, no buccal mucosa involvement Cardiovascular- normal s1,s2, no murmurs, good dorsalis pedis Respiratory- bilateral clear to auscultation, no wheeze, no rhonchi, no crackles, no use of accessory muscles Abdomen- bowel sounds present, soft, non tender Musculoskeletal- able to move all 4 extremities, has back brace, no leg edema Neurological- no focal deficit Skin- warm and dry, honeycomb with dermabond dressing on abdomen with incision well approximated and healing well, incision on her back- 6 incision healing well with dermabond, some ecchymoses noted on the skin area Psychiatry- alert and oriented to person, place and time, normal mood and affect    Labs reviewed: Basic Metabolic Panel:  Recent Labs  03/30/14 1126 04/20/14 0926 04/24/14 1045  NA 142 142 138  K 3.2* 3.2* 4.1  CL 104 102 100  CO2 24 27 28   GLUCOSE 91 92 104*  BUN 12 13 10   CREATININE 0.7 0.77 0.62  CALCIUM 9.6 9.1 9.6   CBC:  Recent Labs  04/20/14 0926 04/24/14 1045  WBC 7.1 11.6*  HGB 12.8 9.6*  HCT 39.6 30.2*  MCV 87.4 86.8  PLT 277 235    Assessment/Plan  Lumbar spinal scoliosis S/p lumbar fusion. Incision dressing looks  fine. Continue skin care. Will have patient work with PT/OT as tolerated to regain strength and restore function.  Fall precautions are in place. Pain under control with Dilaudid 2 mg q6h prn for pain and Robaxin 500 mg q6h prn muscle spasm. Also on lodine 500 mg bid. Will not make any changes to pain regimen for now. Continue vit d supplement  Anemia Check h&h with pt being post op, monitor  Leukocytosis Likely stress response, recheck cbc with diff, monitor for fever  Oral thrush Start nystatin swish and swallow tid for now, if no improvement consider fluconazole dosing. Oral hygiene to be continued  IBS On asacol 1600 mg bid, monitor clinically  Depression and anxiety On wellbutrin 300 mg daily, prozac 60 mg daily and prn xanax. Mood stable. No changes made  Seizure Remains seizure free. Continue lamictal 200 mg daily  Hypothyroidism Continue synthroid 100 mcg daily  HLD On omega 3, continue this  Asthma Stable, continue prn proventil  gerd Continue protonix 40 mg daily   Family/ staff Communication: reviewed care plan with patient and nursing supervisor    Goals of care: short term rehabilitation    Labs/tests ordered: cbc with diff, cmp    Blanchie Serve, MD  [facility] [phone] (Monday-Friday 8 am - 5 pm) [phone] (afterhours)

## 2014-05-05 DIAGNOSIS — J811 Chronic pulmonary edema: Secondary | ICD-10-CM | POA: Diagnosis not present

## 2014-05-18 DIAGNOSIS — M419 Scoliosis, unspecified: Secondary | ICD-10-CM | POA: Diagnosis not present

## 2014-05-18 DIAGNOSIS — M5416 Radiculopathy, lumbar region: Secondary | ICD-10-CM | POA: Diagnosis not present

## 2014-05-18 DIAGNOSIS — M4806 Spinal stenosis, lumbar region: Secondary | ICD-10-CM | POA: Diagnosis not present

## 2014-05-19 ENCOUNTER — Encounter: Payer: Self-pay | Admitting: Adult Health

## 2014-05-19 ENCOUNTER — Non-Acute Institutional Stay (SKILLED_NURSING_FACILITY): Payer: Medicare Other | Admitting: Adult Health

## 2014-05-19 DIAGNOSIS — E039 Hypothyroidism, unspecified: Secondary | ICD-10-CM | POA: Diagnosis not present

## 2014-05-19 DIAGNOSIS — K5190 Ulcerative colitis, unspecified, without complications: Secondary | ICD-10-CM | POA: Diagnosis not present

## 2014-05-19 DIAGNOSIS — K219 Gastro-esophageal reflux disease without esophagitis: Secondary | ICD-10-CM | POA: Diagnosis not present

## 2014-05-19 DIAGNOSIS — M4126 Other idiopathic scoliosis, lumbar region: Secondary | ICD-10-CM

## 2014-05-19 DIAGNOSIS — F419 Anxiety disorder, unspecified: Secondary | ICD-10-CM

## 2014-05-19 DIAGNOSIS — F900 Attention-deficit hyperactivity disorder, predominantly inattentive type: Secondary | ICD-10-CM | POA: Diagnosis not present

## 2014-05-19 DIAGNOSIS — F329 Major depressive disorder, single episode, unspecified: Secondary | ICD-10-CM

## 2014-05-19 DIAGNOSIS — M47816 Spondylosis without myelopathy or radiculopathy, lumbar region: Secondary | ICD-10-CM | POA: Diagnosis not present

## 2014-05-19 DIAGNOSIS — F988 Other specified behavioral and emotional disorders with onset usually occurring in childhood and adolescence: Secondary | ICD-10-CM

## 2014-05-19 DIAGNOSIS — M419 Scoliosis, unspecified: Secondary | ICD-10-CM

## 2014-05-19 DIAGNOSIS — J4520 Mild intermittent asthma, uncomplicated: Secondary | ICD-10-CM

## 2014-05-19 DIAGNOSIS — F32A Depression, unspecified: Secondary | ICD-10-CM

## 2014-05-19 NOTE — Progress Notes (Signed)
Patient ID: [name], female   DOB: 1945-09-13, 68 y.o.   MRN: [id number]   05/19/2014  Facility:  Nursing Home Location:  [place] Room Number: 908-P LEVEL OF CARE:  SNF (31)  Chief Complaint  Patient presents with  . Discharge Note    Lumbar spine scoliosis S/P anterior posterior lumbar fusion from L1-S1, ulcerative colitis, depression with anxiety, osteoarthritis, hypothyroidism, GERD, hyperlipidemia and asthma    HISTORY OF PRESENT ILLNESS:  This is a 68 year old female who is for discharge home with home health PT and OT. She has been admitted to [facility] on 04/29/14 from [facility] with lumbar spine scoliosis status post anterior posterior lumbar fusion from L1-S1. Patient was admitted to this facility for short-term rehabilitation after the patient's recent hospitalization.  Patient has completed SNF rehabilitation and therapy has cleared the patient for discharge.  REASSESSMENT OF ONGOING PROBLEMS:  HYPOTHYROIDISM: The hypothyroidism remains stable. No complications noted from the medications presently being used.  The patient denies fatigue or constipation.  10/15 TSH 2.37  DEPRESSION: The depression remains stable. Patient denies ongoing feelings of sadness, insomnia, anedhonia or lack of appetite. No complications reported from the medications currently being used. Staff do not report behavioral problems.  HYPERLIPIDEMIA: No complications from the medications presently being used. 10/15 fasting lipid panel showed : Cholesterol 259 LDL 176 HDL 47.40 triglycerides 177.0   PAST MEDICAL HISTORY:  Past Medical History  Diagnosis Date  . Anxiety   . Depression   . Thyroid disease   . Allergy   . GERD (gastroesophageal reflux disease)   . IBS (irritable bowel syndrome)   . Ulcerative colitis   . Attention deficit disorder without mention of hyperactivity   . Unspecified asthma(493.90)   . Osteoarthrosis, unspecified  whether generalized or localized, unspecified site   . Other and unspecified hyperlipidemia   . Diverticulosis   . Colitis 12/05/2010  . Unspecified essential hypertension     taken off meds since lost wt  . H/O hiatal hernia     CURRENT MEDICATIONS: Reviewed per MAR/see medication list  Allergies  Allergen Reactions  . Ampicillin Nausea And Vomiting  . Erythromycin Nausea And Vomiting  . Morphine And Related Nausea And Vomiting     REVIEW OF SYSTEMS:  GENERAL: no change in appetite, no fatigue, no weight changes, no fever, chills or weakness RESPIRATORY: no cough, SOB, DOE, wheezing, hemoptysis CARDIAC: no chest pain, edema or palpitations GI: no abdominal pain, diarrhea, constipation, heart burn, nausea or vomiting  PHYSICAL EXAMINATION  GENERAL: no acute distress, normal body habitus EYES: conjunctivae normal, sclerae normal, normal eye lids NECK: supple, trachea midline, no neck masses, no thyroid tenderness, no thyromegaly LYMPHATICS: no LAN in the neck, no supraclavicular LAN RESPIRATORY: breathing is even & unlabored, BS CTAB CARDIAC: RRR, no murmur,no extra heart sounds, no edema GI: abdomen soft, normal BS, no masses, no tenderness, no hepatomegaly, no splenomegaly EXTREMITIES:  Able to move 4 extremities; wears TLSO PSYCHIATRIC: the patient is alert & oriented to person, affect & behavior appropriate  LABS/RADIOLOGY: 05/06/14  urine culture has no growth 05/05/14 chest x-ray show 110 AST 11 ALT 19 WBC 14.1 hemoglobin 9.4 hematocrit 31.1 s no acute cardiopulmonary disease 05/03/14  sodium 137 potassium 3.8 glucose 78 BUN 12 creatinine 0.7 calcium 9.1 total protein 5.8 albumin 3.7 total bilirubin 0.7 ALP Labs reviewed: Basic Metabolic Panel:  Recent Labs  03/30/14 1126 04/20/14 0926 04/24/14 1045  NA 142 142 138  K 3.2* 3.2* 4.1  CL 104 102 100  CO2 24 27 28   GLUCOSE 91 92 104*  BUN 12 13 10   CREATININE 0.7 0.77 0.62  CALCIUM 9.6 9.1 9.6    CBC:  Recent Labs  04/20/14 0926 04/24/14 1045  WBC 7.1 11.6*  HGB 12.8 9.6*  HCT 39.6 30.2*  MCV 87.4 86.8  PLT 277 235   A1C: Invalid input(s): A1C Lipid Panel:  Recent Labs  03/30/14 1126  HDL 47.40   Dg Chest 2 View  04/20/2014   CLINICAL DATA:  Hypertension.  EXAM: CHEST  2 VIEW  COMPARISON:  08/09/2011.  11/25/2008  FINDINGS: Mediastinum and hilar structures are normal. The lungs are clear. Nodular opacity projected over the mid lungs on lateral view only is stable from multiple prior exam and may be related to overlapping shadows. Heart size normal. No pleural effusion or pneumothorax. Right shoulder replacement. Degenerative changes thoracic spine. Surgical clips right upper quadrant.  IMPRESSION: No active cardiopulmonary disease.   Electronically Signed   By: Marcello Moores  Register   On: 04/20/2014 10:09   Dg Thoracolumbar Erect  04/25/2014   CLINICAL DATA:  Scoliosis.  EXAM: THORACOLUMBAR SCOLIOSIS STUDY - STANDING VIEWS  COMPARISON:  MRI lumbar spine 12/11/2013.  FINDINGS: Sacrum is not well imaged on the lateral view. Numbering of lumbar vertebrae is difficult. Lumbar vertebra are numbered with the lowest apparent segmented vertebra as L5 and highest non ribbed vertebrae scratched as L1. Prior lumbar fusion L1 through S1. Normal alignment on lateral view. Scoliosis of the lumbar spine 20 degrees concave left is present. There is a mild T12 and L1 compression fracture. These appear new from prior MRI lumbar spine 12/11/2013. Surgical clips right upper quadrant. Mild colonic distention is noted. Air-fluid levels are noted in the colon. Stool is present in the rectum. Prior cholecystectomy . Aortic atherosclerotic vascular disease.  IMPRESSION: 1. New mild compression fractures T12 and L1. 2. Lumbar scoliosis, 20 degrees concave left. Prior lumbar fusion L1 through S1. 3. Mild colonic distention.  Stool is noted in the rectum. 4. Aortic atherosclerotic vascular disease.    Electronically Signed   By: Marcello Moores  Register   On: 04/25/2014 07:32   Dg Lumbar Spine 2-3 Views  04/22/2014   CLINICAL DATA:  CT the the  EXAM: DG C-ARM GT 120 MIN; LUMBAR SPINE - 2-3 VIEW  : COMPARISON:  None.  FINDINGS: Three intraoperative spot fluoro images are submitted after the procedure for review. On the provided images, there is evidence of interbody graft material at 4 disc levels. Anterior fixation screws are seen at the caudal most of the visualize levels. Given the limited field of view, definite segment numbering is not possible.  IMPRESSION: Intraoperative localization during lumbar fusion.   Electronically Signed   By: Misty Stanley M.D.   On: 04/22/2014 16:33   Dg Lumbar Spine Complete  04/25/2014   CLINICAL DATA:  Lumbar scoliosis.  EXAM: LUMBAR SPINE - COMPLETE 4+ VIEW  COMPARISON:  January 24, 2014.  FINDINGS: Four intraoperative fluoroscopic images were obtained of the lumbar spine. These images demonstrate posterior fusion of L1-S1 with intrapedicular screw placement at all levels. Compression deformity is noted involving the L2 and L5 vertebral bodies which is stable. No spondylolisthesis is noted. Good alignment of vertebral bodies is noted.  IMPRESSION: Status post posterior fusion of L1-S1.   Electronically Signed   By: Sabino Dick M.D.   On: 04/25/2014 12:27   Dg Chest Port 1 View  04/24/2014  CLINICAL DATA:  Postoperative complication.  EXAM: PORTABLE CHEST - 1 VIEW  COMPARISON:  04/20/2014  FINDINGS: Heart size and vascularity are normal. Negative for heart failure. Negative for pneumonia or effusion. Lungs remain clear.  IMPRESSION: No active disease.   Electronically Signed   By: Franchot Gallo M.D.   On: 04/24/2014 09:06   Dg C-arm 61-120 Min  04/25/2014   CLINICAL DATA:  Lumbar scoliosis.  EXAM: LUMBAR SPINE - COMPLETE 4+ VIEW  COMPARISON:  January 24, 2014.  FINDINGS: Four intraoperative fluoroscopic images were obtained of the lumbar spine. These images demonstrate  posterior fusion of L1-S1 with intrapedicular screw placement at all levels. Compression deformity is noted involving the L2 and L5 vertebral bodies which is stable. No spondylolisthesis is noted. Good alignment of vertebral bodies is noted.  IMPRESSION: Status post posterior fusion of L1-S1.   Electronically Signed   By: Sabino Dick M.D.   On: 04/25/2014 12:27   Dg C-arm Gt 120 Min  04/22/2014   CLINICAL DATA:  CT the the  EXAM: DG C-ARM GT 120 MIN; LUMBAR SPINE - 2-3 VIEW  : COMPARISON:  None.  FINDINGS: Three intraoperative spot fluoro images are submitted after the procedure for review. On the provided images, there is evidence of interbody graft material at 4 disc levels. Anterior fixation screws are seen at the caudal most of the visualize levels. Given the limited field of view, definite segment numbering is not possible.  IMPRESSION: Intraoperative localization during lumbar fusion.   Electronically Signed   By: Misty Stanley M.D.   On: 04/22/2014 16:33   Dg Or Local Abdomen  04/22/2014   CLINICAL DATA:  Instrument count for anterior fusion.  EXAM: OR LOCAL ABDOMEN  COMPARISON:  None.  FINDINGS: Change of anterior fusion noted at L5-S1. No unexpected radiopaque foreign body. Degenerative changes and scoliosis in the lumbar spine.  IMPRESSION: No unexpected radiopaque foreign body.  These results were called to the nurse in the OR at the time of interpretation.   Electronically Signed   By: Rolm Baptise M.D.   On: 04/22/2014 10:55    ASSESSMENT/PLAN:  Lumbar spine scoliosis status post anterior posterior lumbar fusion from L1-S1 - for home health PT and the; continue Dilaudid 2 mg 1 by mouth every 6 hours when necessary and Robaxin 500 mg by mouth every 6 hours when necessary for pain management Ulcerative colitis - stable; continue Asacol HD 800 mg take 3 tabs =2,400 mg by mouth twice a day Depression - mood is stable; continue Prozac 20 mg take 3 capsules= 60 mg by mouth daily and Wellbutrin  XL 300 mg by mouth daily Anxiety - stable; continue Xanax 0.5 mg by mouth daily at bedtime when necessary ADD, adult - continue Concerta 27 mg by mouth daily  Osteoarthritis - stable; continue Lodine 500 mg by mouth twice a day Hypothyroidism - stable; TSH 2.37; continue Synthroid 100 g by mouth daily GERD - stable; continue Protonix 40 mg by mouth twice a day Hyperlipidemia - continue omega-3 1000 mg 1 capsule by mouth daily Asthma - stable; continue Proventil when necessary   I have filled out patient's discharge paperwork and written prescriptions.  Patient will receive home health PT and OT.     Total discharge time: Greater than 30 minutes  Discharge time involved coordination of the discharge process with social worker, nursing staff and therapy department. Medical justification for home health services verified.    CPT CODE: 73419   [place], NP  [place] [phone]

## 2014-05-24 DIAGNOSIS — I10 Essential (primary) hypertension: Secondary | ICD-10-CM | POA: Diagnosis not present

## 2014-05-24 DIAGNOSIS — Z981 Arthrodesis status: Secondary | ICD-10-CM | POA: Diagnosis not present

## 2014-05-24 DIAGNOSIS — K5190 Ulcerative colitis, unspecified, without complications: Secondary | ICD-10-CM | POA: Diagnosis not present

## 2014-05-24 DIAGNOSIS — Z4789 Encounter for other orthopedic aftercare: Secondary | ICD-10-CM | POA: Diagnosis not present

## 2014-05-24 DIAGNOSIS — G20 Parkinson's disease: Secondary | ICD-10-CM | POA: Diagnosis not present

## 2014-05-24 DIAGNOSIS — F413 Other mixed anxiety disorders: Secondary | ICD-10-CM | POA: Diagnosis not present

## 2014-05-24 DIAGNOSIS — G40909 Epilepsy, unspecified, not intractable, without status epilepticus: Secondary | ICD-10-CM | POA: Diagnosis not present

## 2014-05-24 DIAGNOSIS — K5790 Diverticulosis of intestine, part unspecified, without perforation or abscess without bleeding: Secondary | ICD-10-CM | POA: Diagnosis not present

## 2014-05-24 DIAGNOSIS — K589 Irritable bowel syndrome without diarrhea: Secondary | ICD-10-CM | POA: Diagnosis not present

## 2014-05-24 DIAGNOSIS — J4520 Mild intermittent asthma, uncomplicated: Secondary | ICD-10-CM | POA: Diagnosis not present

## 2014-05-24 DIAGNOSIS — Z9181 History of falling: Secondary | ICD-10-CM | POA: Diagnosis not present

## 2014-05-26 DIAGNOSIS — I10 Essential (primary) hypertension: Secondary | ICD-10-CM | POA: Diagnosis not present

## 2014-05-26 DIAGNOSIS — J4520 Mild intermittent asthma, uncomplicated: Secondary | ICD-10-CM | POA: Diagnosis not present

## 2014-05-26 DIAGNOSIS — Z4789 Encounter for other orthopedic aftercare: Secondary | ICD-10-CM | POA: Diagnosis not present

## 2014-05-26 DIAGNOSIS — K5190 Ulcerative colitis, unspecified, without complications: Secondary | ICD-10-CM | POA: Diagnosis not present

## 2014-05-26 DIAGNOSIS — F413 Other mixed anxiety disorders: Secondary | ICD-10-CM | POA: Diagnosis not present

## 2014-05-26 DIAGNOSIS — G20 Parkinson's disease: Secondary | ICD-10-CM | POA: Diagnosis not present

## 2014-05-30 DIAGNOSIS — F413 Other mixed anxiety disorders: Secondary | ICD-10-CM | POA: Diagnosis not present

## 2014-05-30 DIAGNOSIS — G20 Parkinson's disease: Secondary | ICD-10-CM | POA: Diagnosis not present

## 2014-05-30 DIAGNOSIS — K5190 Ulcerative colitis, unspecified, without complications: Secondary | ICD-10-CM | POA: Diagnosis not present

## 2014-05-30 DIAGNOSIS — Z4789 Encounter for other orthopedic aftercare: Secondary | ICD-10-CM | POA: Diagnosis not present

## 2014-05-30 DIAGNOSIS — J4520 Mild intermittent asthma, uncomplicated: Secondary | ICD-10-CM | POA: Diagnosis not present

## 2014-05-30 DIAGNOSIS — I10 Essential (primary) hypertension: Secondary | ICD-10-CM | POA: Diagnosis not present

## 2014-06-01 DIAGNOSIS — K5190 Ulcerative colitis, unspecified, without complications: Secondary | ICD-10-CM | POA: Diagnosis not present

## 2014-06-01 DIAGNOSIS — I10 Essential (primary) hypertension: Secondary | ICD-10-CM | POA: Diagnosis not present

## 2014-06-01 DIAGNOSIS — F413 Other mixed anxiety disorders: Secondary | ICD-10-CM | POA: Diagnosis not present

## 2014-06-01 DIAGNOSIS — G20 Parkinson's disease: Secondary | ICD-10-CM | POA: Diagnosis not present

## 2014-06-01 DIAGNOSIS — J4520 Mild intermittent asthma, uncomplicated: Secondary | ICD-10-CM | POA: Diagnosis not present

## 2014-06-01 DIAGNOSIS — Z4789 Encounter for other orthopedic aftercare: Secondary | ICD-10-CM | POA: Diagnosis not present

## 2014-06-02 DIAGNOSIS — F413 Other mixed anxiety disorders: Secondary | ICD-10-CM | POA: Diagnosis not present

## 2014-06-02 DIAGNOSIS — Z4789 Encounter for other orthopedic aftercare: Secondary | ICD-10-CM | POA: Diagnosis not present

## 2014-06-02 DIAGNOSIS — I10 Essential (primary) hypertension: Secondary | ICD-10-CM | POA: Diagnosis not present

## 2014-06-02 DIAGNOSIS — K5190 Ulcerative colitis, unspecified, without complications: Secondary | ICD-10-CM | POA: Diagnosis not present

## 2014-06-02 DIAGNOSIS — G20 Parkinson's disease: Secondary | ICD-10-CM | POA: Diagnosis not present

## 2014-06-02 DIAGNOSIS — J4520 Mild intermittent asthma, uncomplicated: Secondary | ICD-10-CM | POA: Diagnosis not present

## 2014-06-06 DIAGNOSIS — Z4789 Encounter for other orthopedic aftercare: Secondary | ICD-10-CM | POA: Diagnosis not present

## 2014-06-06 DIAGNOSIS — G20 Parkinson's disease: Secondary | ICD-10-CM | POA: Diagnosis not present

## 2014-06-06 DIAGNOSIS — F413 Other mixed anxiety disorders: Secondary | ICD-10-CM | POA: Diagnosis not present

## 2014-06-06 DIAGNOSIS — J4520 Mild intermittent asthma, uncomplicated: Secondary | ICD-10-CM | POA: Diagnosis not present

## 2014-06-06 DIAGNOSIS — K5190 Ulcerative colitis, unspecified, without complications: Secondary | ICD-10-CM | POA: Diagnosis not present

## 2014-06-06 DIAGNOSIS — I10 Essential (primary) hypertension: Secondary | ICD-10-CM | POA: Diagnosis not present

## 2014-06-07 DIAGNOSIS — I10 Essential (primary) hypertension: Secondary | ICD-10-CM | POA: Diagnosis not present

## 2014-06-07 DIAGNOSIS — J4520 Mild intermittent asthma, uncomplicated: Secondary | ICD-10-CM | POA: Diagnosis not present

## 2014-06-07 DIAGNOSIS — Z4789 Encounter for other orthopedic aftercare: Secondary | ICD-10-CM | POA: Diagnosis not present

## 2014-06-07 DIAGNOSIS — F413 Other mixed anxiety disorders: Secondary | ICD-10-CM | POA: Diagnosis not present

## 2014-06-07 DIAGNOSIS — K5190 Ulcerative colitis, unspecified, without complications: Secondary | ICD-10-CM | POA: Diagnosis not present

## 2014-06-07 DIAGNOSIS — G20 Parkinson's disease: Secondary | ICD-10-CM | POA: Diagnosis not present

## 2014-06-09 DIAGNOSIS — I10 Essential (primary) hypertension: Secondary | ICD-10-CM | POA: Diagnosis not present

## 2014-06-09 DIAGNOSIS — Z4789 Encounter for other orthopedic aftercare: Secondary | ICD-10-CM | POA: Diagnosis not present

## 2014-06-09 DIAGNOSIS — G20 Parkinson's disease: Secondary | ICD-10-CM | POA: Diagnosis not present

## 2014-06-09 DIAGNOSIS — K5190 Ulcerative colitis, unspecified, without complications: Secondary | ICD-10-CM | POA: Diagnosis not present

## 2014-06-09 DIAGNOSIS — J4520 Mild intermittent asthma, uncomplicated: Secondary | ICD-10-CM | POA: Diagnosis not present

## 2014-06-09 DIAGNOSIS — F413 Other mixed anxiety disorders: Secondary | ICD-10-CM | POA: Diagnosis not present

## 2014-06-10 DIAGNOSIS — I10 Essential (primary) hypertension: Secondary | ICD-10-CM | POA: Diagnosis not present

## 2014-06-10 DIAGNOSIS — G20 Parkinson's disease: Secondary | ICD-10-CM | POA: Diagnosis not present

## 2014-06-10 DIAGNOSIS — K5190 Ulcerative colitis, unspecified, without complications: Secondary | ICD-10-CM | POA: Diagnosis not present

## 2014-06-10 DIAGNOSIS — F413 Other mixed anxiety disorders: Secondary | ICD-10-CM | POA: Diagnosis not present

## 2014-06-10 DIAGNOSIS — J4520 Mild intermittent asthma, uncomplicated: Secondary | ICD-10-CM | POA: Diagnosis not present

## 2014-06-10 DIAGNOSIS — Z4789 Encounter for other orthopedic aftercare: Secondary | ICD-10-CM | POA: Diagnosis not present

## 2014-06-14 DIAGNOSIS — Z4789 Encounter for other orthopedic aftercare: Secondary | ICD-10-CM | POA: Diagnosis not present

## 2014-06-14 DIAGNOSIS — I10 Essential (primary) hypertension: Secondary | ICD-10-CM | POA: Diagnosis not present

## 2014-06-14 DIAGNOSIS — K5190 Ulcerative colitis, unspecified, without complications: Secondary | ICD-10-CM | POA: Diagnosis not present

## 2014-06-14 DIAGNOSIS — F413 Other mixed anxiety disorders: Secondary | ICD-10-CM | POA: Diagnosis not present

## 2014-06-14 DIAGNOSIS — J4520 Mild intermittent asthma, uncomplicated: Secondary | ICD-10-CM | POA: Diagnosis not present

## 2014-06-14 DIAGNOSIS — G20 Parkinson's disease: Secondary | ICD-10-CM | POA: Diagnosis not present

## 2014-06-15 DIAGNOSIS — M4806 Spinal stenosis, lumbar region: Secondary | ICD-10-CM | POA: Diagnosis not present

## 2014-06-15 DIAGNOSIS — M419 Scoliosis, unspecified: Secondary | ICD-10-CM | POA: Diagnosis not present

## 2014-06-15 DIAGNOSIS — R030 Elevated blood-pressure reading, without diagnosis of hypertension: Secondary | ICD-10-CM | POA: Diagnosis not present

## 2014-06-15 DIAGNOSIS — M5416 Radiculopathy, lumbar region: Secondary | ICD-10-CM | POA: Diagnosis not present

## 2014-06-18 DIAGNOSIS — I10 Essential (primary) hypertension: Secondary | ICD-10-CM | POA: Diagnosis not present

## 2014-06-18 DIAGNOSIS — G20 Parkinson's disease: Secondary | ICD-10-CM | POA: Diagnosis not present

## 2014-06-18 DIAGNOSIS — F413 Other mixed anxiety disorders: Secondary | ICD-10-CM | POA: Diagnosis not present

## 2014-06-18 DIAGNOSIS — J4520 Mild intermittent asthma, uncomplicated: Secondary | ICD-10-CM | POA: Diagnosis not present

## 2014-06-18 DIAGNOSIS — K5190 Ulcerative colitis, unspecified, without complications: Secondary | ICD-10-CM | POA: Diagnosis not present

## 2014-06-18 DIAGNOSIS — Z4789 Encounter for other orthopedic aftercare: Secondary | ICD-10-CM | POA: Diagnosis not present

## 2014-06-22 ENCOUNTER — Other Ambulatory Visit: Payer: Self-pay | Admitting: Internal Medicine

## 2014-06-27 DIAGNOSIS — K5190 Ulcerative colitis, unspecified, without complications: Secondary | ICD-10-CM | POA: Diagnosis not present

## 2014-06-27 DIAGNOSIS — Z4789 Encounter for other orthopedic aftercare: Secondary | ICD-10-CM | POA: Diagnosis not present

## 2014-06-27 DIAGNOSIS — J4520 Mild intermittent asthma, uncomplicated: Secondary | ICD-10-CM | POA: Diagnosis not present

## 2014-06-27 DIAGNOSIS — G20 Parkinson's disease: Secondary | ICD-10-CM | POA: Diagnosis not present

## 2014-06-27 DIAGNOSIS — F413 Other mixed anxiety disorders: Secondary | ICD-10-CM | POA: Diagnosis not present

## 2014-06-27 DIAGNOSIS — I10 Essential (primary) hypertension: Secondary | ICD-10-CM | POA: Diagnosis not present

## 2014-06-28 DIAGNOSIS — F39 Unspecified mood [affective] disorder: Secondary | ICD-10-CM | POA: Diagnosis not present

## 2014-06-28 DIAGNOSIS — F900 Attention-deficit hyperactivity disorder, predominantly inattentive type: Secondary | ICD-10-CM | POA: Diagnosis not present

## 2014-07-01 DIAGNOSIS — K5190 Ulcerative colitis, unspecified, without complications: Secondary | ICD-10-CM | POA: Diagnosis not present

## 2014-07-04 DIAGNOSIS — M4120 Other idiopathic scoliosis, site unspecified: Secondary | ICD-10-CM | POA: Diagnosis not present

## 2014-07-04 DIAGNOSIS — M5416 Radiculopathy, lumbar region: Secondary | ICD-10-CM | POA: Diagnosis not present

## 2014-07-04 DIAGNOSIS — R030 Elevated blood-pressure reading, without diagnosis of hypertension: Secondary | ICD-10-CM | POA: Diagnosis not present

## 2014-07-04 DIAGNOSIS — M47816 Spondylosis without myelopathy or radiculopathy, lumbar region: Secondary | ICD-10-CM | POA: Diagnosis not present

## 2014-07-04 DIAGNOSIS — M545 Low back pain: Secondary | ICD-10-CM | POA: Diagnosis not present

## 2014-07-05 DIAGNOSIS — M5416 Radiculopathy, lumbar region: Secondary | ICD-10-CM | POA: Diagnosis not present

## 2014-07-07 DIAGNOSIS — F39 Unspecified mood [affective] disorder: Secondary | ICD-10-CM | POA: Diagnosis not present

## 2014-07-07 DIAGNOSIS — F900 Attention-deficit hyperactivity disorder, predominantly inattentive type: Secondary | ICD-10-CM | POA: Diagnosis not present

## 2014-07-07 DIAGNOSIS — M5416 Radiculopathy, lumbar region: Secondary | ICD-10-CM | POA: Diagnosis not present

## 2014-07-12 DIAGNOSIS — M5416 Radiculopathy, lumbar region: Secondary | ICD-10-CM | POA: Diagnosis not present

## 2014-07-20 DIAGNOSIS — F39 Unspecified mood [affective] disorder: Secondary | ICD-10-CM | POA: Diagnosis not present

## 2014-07-21 ENCOUNTER — Other Ambulatory Visit: Payer: Self-pay | Admitting: Internal Medicine

## 2014-07-21 DIAGNOSIS — M5416 Radiculopathy, lumbar region: Secondary | ICD-10-CM | POA: Diagnosis not present

## 2014-07-27 DIAGNOSIS — M4120 Other idiopathic scoliosis, site unspecified: Secondary | ICD-10-CM | POA: Diagnosis not present

## 2014-07-27 DIAGNOSIS — M545 Low back pain: Secondary | ICD-10-CM | POA: Diagnosis not present

## 2014-07-27 DIAGNOSIS — M4806 Spinal stenosis, lumbar region: Secondary | ICD-10-CM | POA: Diagnosis not present

## 2014-07-27 DIAGNOSIS — R030 Elevated blood-pressure reading, without diagnosis of hypertension: Secondary | ICD-10-CM | POA: Diagnosis not present

## 2014-07-27 DIAGNOSIS — M5416 Radiculopathy, lumbar region: Secondary | ICD-10-CM | POA: Diagnosis not present

## 2014-07-28 DIAGNOSIS — M5416 Radiculopathy, lumbar region: Secondary | ICD-10-CM | POA: Diagnosis not present

## 2014-08-08 ENCOUNTER — Ambulatory Visit: Payer: Medicare Other | Admitting: Internal Medicine

## 2014-08-09 DIAGNOSIS — F39 Unspecified mood [affective] disorder: Secondary | ICD-10-CM | POA: Diagnosis not present

## 2014-08-09 DIAGNOSIS — F900 Attention-deficit hyperactivity disorder, predominantly inattentive type: Secondary | ICD-10-CM | POA: Diagnosis not present

## 2014-08-10 DIAGNOSIS — M5416 Radiculopathy, lumbar region: Secondary | ICD-10-CM | POA: Diagnosis not present

## 2014-08-11 ENCOUNTER — Encounter: Payer: Self-pay | Admitting: Family

## 2014-08-11 ENCOUNTER — Ambulatory Visit (INDEPENDENT_AMBULATORY_CARE_PROVIDER_SITE_OTHER): Payer: Medicare Other | Admitting: Family

## 2014-08-11 VITALS — BP 170/80 | HR 76 | Temp 97.9°F | Resp 18 | Ht 64.0 in | Wt 145.1 lb

## 2014-08-11 DIAGNOSIS — R42 Dizziness and giddiness: Secondary | ICD-10-CM | POA: Diagnosis not present

## 2014-08-11 NOTE — Progress Notes (Signed)
Subjective:    Patient ID: [name], female    DOB: [birth year], 69 y.o.   MRN: [id number]  Chief Complaint  Patient presents with  . Dizziness    she says she has no balance, broke a hip last may and had surgery to help her walk normal, loss of appetite,     HPI:  [name] is a 69 y.o. female who presents today for an acute visit.   This is a new problem. Associated symptoms of being off balance has been going on for several days. Indicates that she got up this morning and has been more wobbly then previous. She notes fell and has had hip surgery. Notes a loss of appetite as well. Denies the sensation of room spinning. Notes that she has recently started taking Belsomra to help her sleep, when she was previously taking Ambien.    Allergies  Allergen Reactions  . Ampicillin Nausea And Vomiting  . Erythromycin Nausea And Vomiting  . Morphine And Related Nausea And Vomiting    Current Outpatient Prescriptions on File Prior to Visit  Medication Sig Dispense Refill  . albuterol (PROVENTIL HFA;VENTOLIN HFA) 108 (90 BASE) MCG/ACT inhaler Inhale 1 puff into the lungs 4 (four) times daily as needed for wheezing or shortness of breath.    . ALPRAZolam (XANAX) 0.5 MG tablet Take 0.5 mg by mouth as needed for sleep.    [name] buPROPion (WELLBUTRIN XL) 300 MG 24 hr tablet Take 300 mg by mouth daily.    . calcium citrate-vitamin D (CITRACAL+D) 315-200 MG-UNIT per tablet Take 1 tablet by mouth 2 (two) times daily.    . Chlorpheniramine Maleate (ALLERGY PO) Take 1 tablet by mouth daily.    . Cholecalciferol (VITAMIN D PO) Take 1 tablet by mouth daily.    . Cyanocobalamin (VITAMIN B-12 PO) Take 2 tablets by mouth daily.    [name] etodolac (LODINE) 500 MG tablet Take 500 mg by mouth 2 (two) times daily.    [name] FLUoxetine (PROZAC) 20 MG capsule Take 60 mg by mouth daily.    [name] ibuprofen (ADVIL,MOTRIN) 200 MG tablet Take 800 mg by mouth daily as needed (pain).    . inFLIXimab (REMICADE) 100 MG  injection Inject into the vein. Every 2 months    . lamoTRIgine (LAMICTAL) 200 MG tablet Take 200 mg by mouth daily.      [name] levothyroxine (SYNTHROID, LEVOTHROID) 100 MCG tablet Take 100 mcg by mouth daily before breakfast.    . Mesalamine (ASACOL HD) 800 MG TBEC Take 2,400 mg by mouth 2 (two) times daily.    . methylphenidate 27 MG PO CR tablet Take one tablet by mouth once daily for ADHD. Do Not Crush 30 tablet 0  . Omega-3 Fatty Acids (OMEGA 3 PO) Take 1 capsule by mouth daily.    . pantoprazole (PROTONIX) 40 MG tablet Take 40 mg by mouth 2 (two) times daily.    . Probiotic Product (PROBIOTIC PO) Take 1 capsule by mouth daily.    . promethazine (PHENERGAN) 12.5 MG tablet Take 1 tablet (12.5 mg total) by mouth every 6 (six) hours as needed for nausea or vomiting. 30 tablet 3  . [DISCONTINUED] losartan-hydrochlorothiazide (HYZAAR) 50-12.5 MG per tablet Take 1 tablet by mouth daily.    . [DISCONTINUED] simvastatin (ZOCOR) 20 MG tablet Take 20 mg by mouth every evening.     No current facility-administered medications on file prior to visit.    Review of Systems  Constitutional: Negative for  fever and chills.  Gastrointestinal: Positive for nausea.  Neurological: Positive for dizziness and weakness.      Objective:    BP 170/80 mmHg  Pulse 76  Temp(Src) 97.9 F (36.6 C) (Oral)  Resp 18  Ht 5\' 4"  (1.626 m)  Wt 145 lb 1.9 oz (65.826 kg)  BMI 24.90 kg/m2  SpO2 99% Nursing note and vital signs reviewed.  Physical Exam  Constitutional: She is oriented to person, place, and time. She appears well-developed and well-nourished. No distress.  HENT:  Right Ear: Hearing, tympanic membrane, external ear and ear canal normal.  Left Ear: Hearing, external ear and ear canal normal.  Moderate cerumen noted in left ear blocking TM.   Eyes: Conjunctivae and EOM are normal. Pupils are equal, round, and reactive to light.  Cardiovascular: Normal rate, regular rhythm, normal heart sounds and intact  distal pulses.   Pulmonary/Chest: Effort normal and breath sounds normal.  Neurological: She is alert and oriented to person, place, and time. No cranial nerve deficit. She exhibits normal muscle tone. Coordination normal.  Skin: Skin is warm and dry.  Psychiatric: She has a normal mood and affect. Her behavior is normal. Judgment and thought content normal.       Assessment & Plan:

## 2014-08-11 NOTE — Assessment & Plan Note (Signed)
Symptoms of disequilibrium are potentially related to impacted cerumen or side effects from Conde. Patient will discontinue both summary use at this time. Recipe for solution to clean out her ears with provided to patient. Discussed risk factors of polypharmacy and several medications that may result in this sensation. Follow-up with Dr. Doug Sou in the morning to determine if Belsomra was indeed the cause.

## 2014-08-11 NOTE — Progress Notes (Signed)
Pre visit review using our clinic review tool, if applicable. No additional management support is needed unless otherwise documented below in the visit note. 

## 2014-08-11 NOTE — Patient Instructions (Addendum)
Thank you for choosing [facility].  Summary/Instructions:  Please stop taking the Belsomra tonight to see if there is any improvement.   If your symptoms worsen or fail to improve, please contact our office for further instruction, or in case of emergency go directly to the emergency room at the closest medical facility.   Cerumen Impaction A cerumen impaction is when the wax in your ear forms a plug. This plug usually causes reduced hearing. Sometimes it also causes an earache or dizziness. Removing a cerumen impaction can be difficult and painful. The wax sticks to the ear canal. The canal is sensitive and bleeds easily. If you try to remove a heavy wax buildup with a cotton tipped swab, you may push it in further. Irrigation with water, suction, and small ear curettes may be used to clear out the wax. If the impaction is fixed to the skin in the ear canal, ear drops may be needed for a few days to loosen the wax. People who build up a lot of wax frequently can use ear wax removal products available in your local drugstore. SEEK MEDICAL CARE IF:  You develop an earache, increased hearing loss, or marked dizziness. Document Released: 07/18/2004 Document Revised: 09/02/2011 Document Reviewed: 09/07/2009 [facility] Patient Information 2015 [place], [place]. This information is not intended to replace advice given to you by your health care provider. Make sure you discuss any questions you have with your health care provider.  To prevent wax buildup within the ear:   Use equal parts of water and white vinegar  Soak a cotton ball in the solution and place several drops within the ear  Insert cotton ball in external ear canal and let sit for 30 minutes prior to shower  Remove cotton ball and gently irrigate the ear canal in the shower.  Do not irrigate directly into the ear but rather let it hit the external canal and irrigate.  For maintenance, this can be done 1 time weekly.

## 2014-08-12 DIAGNOSIS — M5416 Radiculopathy, lumbar region: Secondary | ICD-10-CM | POA: Diagnosis not present

## 2014-08-16 DIAGNOSIS — M5416 Radiculopathy, lumbar region: Secondary | ICD-10-CM | POA: Diagnosis not present

## 2014-08-19 DIAGNOSIS — M5416 Radiculopathy, lumbar region: Secondary | ICD-10-CM | POA: Diagnosis not present

## 2014-08-23 DIAGNOSIS — M5416 Radiculopathy, lumbar region: Secondary | ICD-10-CM | POA: Diagnosis not present

## 2014-08-26 DIAGNOSIS — M5416 Radiculopathy, lumbar region: Secondary | ICD-10-CM | POA: Diagnosis not present

## 2014-08-29 DIAGNOSIS — K5180 Other ulcerative colitis without complications: Secondary | ICD-10-CM | POA: Diagnosis not present

## 2014-08-29 DIAGNOSIS — K5190 Ulcerative colitis, unspecified, without complications: Secondary | ICD-10-CM | POA: Diagnosis not present

## 2014-10-03 ENCOUNTER — Telehealth: Payer: Self-pay | Admitting: Internal Medicine

## 2014-10-05 MED ORDER — MESALAMINE 800 MG PO TBEC: 2400.0000 mg | DELAYED_RELEASE_TABLET | Freq: Two times a day (BID) | ORAL | Status: DC

## 2014-10-05 NOTE — Telephone Encounter (Signed)
Sent rx for Asacol to prescription shop in [place], [place] per patient's request

## 2014-10-07 DIAGNOSIS — I639 Cerebral infarction, unspecified: Secondary | ICD-10-CM | POA: Diagnosis not present

## 2014-10-07 DIAGNOSIS — E079 Disorder of thyroid, unspecified: Secondary | ICD-10-CM | POA: Diagnosis not present

## 2014-10-07 DIAGNOSIS — M6281 Muscle weakness (generalized): Secondary | ICD-10-CM | POA: Diagnosis not present

## 2014-10-07 DIAGNOSIS — M5412 Radiculopathy, cervical region: Secondary | ICD-10-CM | POA: Diagnosis not present

## 2014-10-07 DIAGNOSIS — Z880 Allergy status to penicillin: Secondary | ICD-10-CM | POA: Diagnosis not present

## 2014-10-07 DIAGNOSIS — Z87891 Personal history of nicotine dependence: Secondary | ICD-10-CM | POA: Diagnosis not present

## 2014-10-07 DIAGNOSIS — R209 Unspecified disturbances of skin sensation: Secondary | ICD-10-CM | POA: Diagnosis not present

## 2014-10-07 DIAGNOSIS — Z888 Allergy status to other drugs, medicaments and biological substances status: Secondary | ICD-10-CM | POA: Diagnosis not present

## 2014-10-27 DIAGNOSIS — K5190 Ulcerative colitis, unspecified, without complications: Secondary | ICD-10-CM | POA: Diagnosis not present

## 2014-11-02 DIAGNOSIS — M4806 Spinal stenosis, lumbar region: Secondary | ICD-10-CM | POA: Diagnosis not present

## 2014-11-02 DIAGNOSIS — R030 Elevated blood-pressure reading, without diagnosis of hypertension: Secondary | ICD-10-CM | POA: Diagnosis not present

## 2014-11-02 DIAGNOSIS — Z6825 Body mass index (BMI) 25.0-25.9, adult: Secondary | ICD-10-CM | POA: Diagnosis not present

## 2014-11-02 DIAGNOSIS — M5416 Radiculopathy, lumbar region: Secondary | ICD-10-CM | POA: Diagnosis not present

## 2014-11-02 DIAGNOSIS — M545 Low back pain: Secondary | ICD-10-CM | POA: Diagnosis not present

## 2014-11-02 DIAGNOSIS — M47816 Spondylosis without myelopathy or radiculopathy, lumbar region: Secondary | ICD-10-CM | POA: Diagnosis not present

## 2014-11-02 DIAGNOSIS — M4120 Other idiopathic scoliosis, site unspecified: Secondary | ICD-10-CM | POA: Diagnosis not present

## 2014-11-23 ENCOUNTER — Other Ambulatory Visit: Payer: Self-pay | Admitting: Internal Medicine

## 2014-11-23 DIAGNOSIS — M5416 Radiculopathy, lumbar region: Secondary | ICD-10-CM | POA: Diagnosis not present

## 2014-11-29 DIAGNOSIS — M5416 Radiculopathy, lumbar region: Secondary | ICD-10-CM | POA: Diagnosis not present

## 2014-12-01 DIAGNOSIS — M5416 Radiculopathy, lumbar region: Secondary | ICD-10-CM | POA: Diagnosis not present

## 2014-12-05 DIAGNOSIS — F39 Unspecified mood [affective] disorder: Secondary | ICD-10-CM | POA: Diagnosis not present

## 2014-12-05 DIAGNOSIS — F900 Attention-deficit hyperactivity disorder, predominantly inattentive type: Secondary | ICD-10-CM | POA: Diagnosis not present

## 2014-12-06 DIAGNOSIS — M5416 Radiculopathy, lumbar region: Secondary | ICD-10-CM | POA: Diagnosis not present

## 2014-12-08 DIAGNOSIS — Z96611 Presence of right artificial shoulder joint: Secondary | ICD-10-CM | POA: Diagnosis not present

## 2014-12-08 DIAGNOSIS — Z471 Aftercare following joint replacement surgery: Secondary | ICD-10-CM | POA: Diagnosis not present

## 2014-12-14 ENCOUNTER — Encounter: Payer: Self-pay | Admitting: Internal Medicine

## 2014-12-14 ENCOUNTER — Ambulatory Visit (INDEPENDENT_AMBULATORY_CARE_PROVIDER_SITE_OTHER): Payer: Medicare Other | Admitting: Internal Medicine

## 2014-12-14 VITALS — BP 130/74 | HR 78 | Temp 97.8°F | Resp 12 | Ht 66.0 in | Wt 150.0 lb

## 2014-12-14 DIAGNOSIS — T63461A Toxic effect of venom of wasps, accidental (unintentional), initial encounter: Secondary | ICD-10-CM | POA: Insufficient documentation

## 2014-12-14 MED ORDER — PREDNISONE 20 MG PO TABS: ORAL_TABLET | ORAL | Status: DC

## 2014-12-14 NOTE — Progress Notes (Signed)
Pre visit review using our clinic review tool, if applicable. No additional management support is needed unless otherwise documented below in the visit note. 

## 2014-12-14 NOTE — Assessment & Plan Note (Signed)
Prednisone taper for the reaction to bee sting. Not true allergy and no symptoms of anaphylaxis. No need for epi pen at this time. Likely increased reaction due to number of stings. She can keep using benadryl as needed and ice packs for swelling. She has outlined and if still growing on prednisone will call us back.

## 2014-12-14 NOTE — Patient Instructions (Signed)
For the prednisone: Day 1-3 take 3 pills daily, Days 4-6 take 2 pills daily, Days 7-9 take 1 pill daily then stop.  This should help the rash to go away and stop the reaction.   Bee, Wasp, or Hornet Sting Your caregiver has diagnosed you as having an insect sting. An insect sting appears as a red lump in the skin that sometimes has a tiny hole in the center, or it may have a stinger in the center of the wound. The most common stings are from wasps, hornets and bees. Individuals have different reactions to insect stings.  A normal reaction may cause pain, swelling, and redness around the sting site.  A localized allergic reaction may cause swelling and redness that extends beyond the sting site.  A large local reaction may continue to develop over the next 12 to 36 hours.  On occasion, the reactions can be severe (anaphylactic reaction). An anaphylactic reaction may cause wheezing; difficulty breathing; chest pain; fainting; raised, itchy, red patches on the skin; a sick feeling to your stomach (nausea); vomiting; cramping; or diarrhea. If you have had an anaphylactic reaction to an insect sting in the past, you are more likely to have one again. HOME CARE INSTRUCTIONS   With bee stings, a small sac of poison is left in the wound. Brushing across this with something such as a credit card, or anything similar, will help remove this and decrease the amount of the reaction. This same procedure will not help a wasp sting as they do not leave behind a stinger and poison sac.  Apply a cold compress for 10 to 20 minutes every hour for 1 to 2 days, depending on severity, to reduce swelling and itching.  To lessen pain, a paste made of water and baking soda may be rubbed on the bite or sting and left on for 5 minutes.  To relieve itching and swelling, you may use take medication or apply medicated creams or lotions as directed.  Only take over-the-counter or prescription medicines for pain,  discomfort, or fever as directed by your caregiver.  Wash the sting site daily with soap and water. Apply antibiotic ointment on the sting site as directed.  If you suffered a severe reaction:  If you did not require hospitalization, an adult will need to stay with you for 24 hours in case the symptoms return.  You may need to wear a medical bracelet or necklace stating the allergy.  You and your family need to learn when and how to use an anaphylaxis kit or epinephrine injection.  If you have had a severe reaction before, always carry your anaphylaxis kit with you. SEEK MEDICAL CARE IF:   None of the above helps within 2 to 3 days.  The area becomes red, warm, tender, and swollen beyond the area of the bite or sting.  You have an oral temperature above 102 F (38.9 C). SEEK IMMEDIATE MEDICAL CARE IF:  You have symptoms of an allergic reaction which are:  Wheezing.  Difficulty breathing.  Chest pain.  Lightheadedness or fainting.  Itchy, raised, red patches on the skin.  Nausea, vomiting, cramping or diarrhea. ANY OF THESE SYMPTOMS MAY REPRESENT A SERIOUS PROBLEM THAT IS AN EMERGENCY. Do not wait to see if the symptoms will go away. Get medical help right away. Call your local emergency services (911 in [place] DO NOT drive yourself to the hospital. MAKE SURE YOU:   Understand these instructions.  Will watch your condition.    Will get help right away if you are not doing well or get worse. Document Released: 06/10/2005 Document Revised: 09/02/2011 Document Reviewed: 11/25/2009 [facility] Patient Information 2015 [place], [place]. This information is not intended to replace advice given to you by your health care provider. Make sure you discuss any questions you have with your health care provider.

## 2014-12-14 NOTE — Progress Notes (Signed)
   Subjective:    Patient ID: [name], female    DOB: 1946-01-25, 69 y.o.   MRN: [id number]  HPI The patient is a 69 YO female coming in for wasp stings. She was stung 5 times on Saturday. Since that time she has swollen up and red rash around those areas. It has not improved since that time. Swelling is stable and no fevers or chills. No SOB or swelling around her mouth.   Review of Systems  Constitutional: Positive for activity change. Negative for fever, appetite change, fatigue and unexpected weight change.  Respiratory: Negative.   Cardiovascular: Negative.   Gastrointestinal: Negative.   Musculoskeletal: Positive for myalgias.  Skin: Positive for rash and wound. Negative for color change and pallor.     Objective:   Physical Exam  Constitutional: She appears well-developed and well-nourished.  HENT:  Mouth/Throat: Oropharynx is clear and moist.  Cardiovascular: Normal rate and regular rhythm.   Pulmonary/Chest: Effort normal. No respiratory distress. She has no wheezes. She has no rales.  Abdominal: Soft. Bowel sounds are normal.  Skin: Skin is warm and dry.  5 separate sting spots, each swollen and hard to the touch with redness around the area, 1 cm to 3 cm in diameter areas.    Filed Vitals:   12/14/14 1008  BP: 130/74  Pulse: 78  Temp: 97.8 F (36.6 C)  TempSrc: Oral  Resp: 12  Height: 5' 6"  (1.676 m)  Weight: 150 lb (68.04 kg)  SpO2: 98%      Assessment & Plan:

## 2014-12-16 DIAGNOSIS — M5416 Radiculopathy, lumbar region: Secondary | ICD-10-CM | POA: Diagnosis not present

## 2014-12-21 ENCOUNTER — Ambulatory Visit: Payer: Medicare Other | Admitting: Internal Medicine

## 2014-12-22 ENCOUNTER — Telehealth: Payer: Self-pay | Admitting: Internal Medicine

## 2014-12-22 ENCOUNTER — Ambulatory Visit (INDEPENDENT_AMBULATORY_CARE_PROVIDER_SITE_OTHER): Payer: Medicare Other | Admitting: Internal Medicine

## 2014-12-22 ENCOUNTER — Other Ambulatory Visit (INDEPENDENT_AMBULATORY_CARE_PROVIDER_SITE_OTHER): Payer: Medicare Other

## 2014-12-22 ENCOUNTER — Encounter: Payer: Self-pay | Admitting: Internal Medicine

## 2014-12-22 VITALS — BP 140/80 | HR 74 | Wt 150.0 lb

## 2014-12-22 DIAGNOSIS — E559 Vitamin D deficiency, unspecified: Secondary | ICD-10-CM

## 2014-12-22 DIAGNOSIS — R21 Rash and other nonspecific skin eruption: Secondary | ICD-10-CM

## 2014-12-22 DIAGNOSIS — R5383 Other fatigue: Secondary | ICD-10-CM

## 2014-12-22 DIAGNOSIS — R202 Paresthesia of skin: Secondary | ICD-10-CM

## 2014-12-22 DIAGNOSIS — M79605 Pain in left leg: Secondary | ICD-10-CM

## 2014-12-22 DIAGNOSIS — M545 Low back pain, unspecified: Secondary | ICD-10-CM

## 2014-12-22 LAB — CBC WITH DIFFERENTIAL/PLATELET
BASOS PCT: 0.4 % (ref 0.0–3.0)
Basophils Absolute: 0 10*3/uL (ref 0.0–0.1)
Eosinophils Absolute: 0.5 10*3/uL (ref 0.0–0.7)
Eosinophils Relative: 5 % (ref 0.0–5.0)
HEMATOCRIT: 37.3 % (ref 36.0–46.0)
Hemoglobin: 12.1 g/dL (ref 12.0–15.0)
Lymphocytes Relative: 16.9 % (ref 12.0–46.0)
Lymphs Abs: 1.6 10*3/uL (ref 0.7–4.0)
MCHC: 32.5 g/dL (ref 30.0–36.0)
MCV: 79.6 fl (ref 78.0–100.0)
Monocytes Absolute: 1 10*3/uL (ref 0.1–1.0)
Monocytes Relative: 10.4 % (ref 3.0–12.0)
NEUTROS ABS: 6.2 10*3/uL (ref 1.4–7.7)
Neutrophils Relative %: 67.3 % (ref 43.0–77.0)
Platelets: 301 10*3/uL (ref 150.0–400.0)
RBC: 4.69 Mil/uL (ref 3.87–5.11)
RDW: 16.1 % — ABNORMAL HIGH (ref 11.5–15.5)
WBC: 9.2 10*3/uL (ref 4.0–10.5)

## 2014-12-22 LAB — HEPATIC FUNCTION PANEL
ALT: 13 U/L (ref 0–35)
AST: 12 U/L (ref 0–37)
Albumin: 3.9 g/dL (ref 3.5–5.2)
Alkaline Phosphatase: 90 U/L (ref 39–117)
Bilirubin, Direct: 0.1 mg/dL (ref 0.0–0.3)
Total Bilirubin: 0.3 mg/dL (ref 0.2–1.2)
Total Protein: 6.7 g/dL (ref 6.0–8.3)

## 2014-12-22 LAB — VITAMIN D 25 HYDROXY (VIT D DEFICIENCY, FRACTURES): VITD: 33.87 ng/mL (ref 30.00–100.00)

## 2014-12-22 LAB — BASIC METABOLIC PANEL
BUN: 14 mg/dL (ref 6–23)
CO2: 31 meq/L (ref 19–32)
CREATININE: 0.73 mg/dL (ref 0.40–1.20)
Calcium: 9.3 mg/dL (ref 8.4–10.5)
Chloride: 105 mEq/L (ref 96–112)
GFR: 84 mL/min (ref >60.00)
Glucose, Bld: 89 mg/dL (ref 70–99)
Potassium: 4.1 mEq/L (ref 3.5–5.1)
Sodium: 141 mEq/L (ref 135–145)

## 2014-12-22 LAB — VITAMIN B12: Vitamin B-12: >1500 pg/mL — ABNORMAL HIGH (ref 211–911)

## 2014-12-22 LAB — TSH: TSH: 1.06 u[IU]/mL (ref 0.35–4.50)

## 2014-12-22 LAB — SEDIMENTATION RATE: SED RATE: 19 mm/h (ref 0–22)

## 2014-12-22 MED ORDER — ACYCLOVIR 800 MG PO TABS: 800.0000 mg | ORAL_TABLET | Freq: Every day | ORAL | Status: DC

## 2014-12-22 MED ORDER — METHOCARBAMOL 500 MG PO TABS: 500.0000 mg | ORAL_TABLET | Freq: Four times a day (QID) | ORAL | Status: DC

## 2014-12-22 MED ORDER — HYDROMORPHONE HCL 2 MG PO TABS: 2.0000 mg | ORAL_TABLET | ORAL | Status: DC | PRN

## 2014-12-22 MED ORDER — BUPROPION HCL ER (XL) 300 MG PO TB24: 300.0000 mg | ORAL_TABLET | Freq: Every day | ORAL | Status: AC

## 2014-12-22 NOTE — Progress Notes (Signed)
Pre visit review using our clinic review tool, if applicable. No additional management support is needed unless otherwise documented below in the visit note. 

## 2014-12-22 NOTE — Telephone Encounter (Signed)
Pt just saw Dr. Camila Li and she forgot to ask if it is okey for her to do her back therapy with her PT ( her PT does not want to do it without doctor recommendation). Please advise.

## 2014-12-22 NOTE — Telephone Encounter (Signed)
OK to start PT if labs are ok (Lab results are not back yet) Thx

## 2014-12-22 NOTE — Assessment & Plan Note (Signed)
L prox buttock is w/a patch of rash ?a heating pain burn Acyclovir if rash UA

## 2014-12-22 NOTE — Progress Notes (Signed)
  Subjective:   HPI  C/o rash on back since Tue C/o LBP  C/o fatigue x weeks   Wt Readings from Last 3 Encounters:  12/14/14 150 lb (68.04 kg)  08/11/14 145 lb 1.9 oz (65.826 kg)  05/19/14 154 lb 3.2 oz (69.945 kg)   BP Readings from Last 3 Encounters:  12/14/14 130/74  08/11/14 170/80  05/19/14 124/61      Review of Systems  Constitutional: Negative for fever, chills, diaphoresis, activity change, appetite change, fatigue and unexpected weight change.  HENT: Negative for congestion, ear pain, facial swelling, hearing loss, mouth sores, nosebleeds, postnasal drip, rhinorrhea, sinus pressure, sneezing, sore throat, tinnitus and trouble swallowing.   Eyes: Negative for pain, discharge, redness, itching and visual disturbance.  Respiratory: Negative for cough, chest tightness, shortness of breath, wheezing and stridor.   Cardiovascular: Negative for chest pain, palpitations and leg swelling.  Gastrointestinal: Negative for nausea, abdominal pain, diarrhea, constipation, blood in stool, abdominal distention, anal bleeding and rectal pain.  Genitourinary: Negative for dysuria, urgency, frequency, hematuria, flank pain, vaginal bleeding, vaginal discharge, difficulty urinating, genital sores and pelvic pain.  Musculoskeletal: Positive for back pain, arthralgias and gait problem. Negative for joint swelling, neck pain and neck stiffness.  Skin: Positive for rash.  Neurological: Positive for weakness. Negative for dizziness, tremors, seizures, syncope, speech difficulty, numbness and headaches.  Hematological: Negative for adenopathy. Does not bruise/bleed easily.  Psychiatric/Behavioral: Negative for suicidal ideas, behavioral problems, sleep disturbance, dysphoric mood and decreased concentration. The patient is not nervous/anxious.        Objective:   Physical Exam  Constitutional: She appears well-developed and well-nourished. No distress.  Neck: No tracheal deviation present.   Pulmonary/Chest: She has no rales.  Abdominal: She exhibits no distension and no mass. There is no tenderness. There is no rebound and no guarding.  Musculoskeletal: She exhibits edema (trace R foot) and tenderness.  LS is tender B troch major is tender  Neurological: She displays normal reflexes. Coordination normal.  Skin: Rash noted. No pallor.  Psychiatric: She has a normal mood and affect. Her behavior is normal. Judgment normal.  using a walker L prox buttock is w/a patch of rash ?a heating pain burn  Lab Results  Component Value Date   WBC 11.6* 04/24/2014   HGB 9.6* 04/24/2014   HCT 30.2* 04/24/2014   PLT 235 04/24/2014   GLUCOSE 104* 04/24/2014   CHOL 259* 03/30/2014   TRIG 177.0* 03/30/2014   HDL 47.40 03/30/2014   LDLDIRECT 182.5 02/02/2008   LDLCALC 176* 03/30/2014   ALT 20 11/04/2012   AST 20 11/04/2012   NA 138 04/24/2014   K 4.1 04/24/2014   CL 100 04/24/2014   CREATININE 0.62 04/24/2014   BUN 10 04/24/2014   CO2 28 04/24/2014   TSH 2.37 03/30/2014   INR 1.0 11/25/2008   HGBA1C 5.5 04/24/2012        Assessment & Plan:

## 2014-12-22 NOTE — Assessment & Plan Note (Signed)
L prox buttock is w/a patch of rash ?a heating pain burn

## 2014-12-23 NOTE — Telephone Encounter (Signed)
Left detailed mess informing pt of below.  

## 2014-12-29 DIAGNOSIS — M5416 Radiculopathy, lumbar region: Secondary | ICD-10-CM | POA: Diagnosis not present

## 2014-12-30 ENCOUNTER — Other Ambulatory Visit: Payer: Self-pay | Admitting: Internal Medicine

## 2014-12-30 NOTE — Telephone Encounter (Signed)
Okay to refill? Was seen on 12/14/14

## 2015-01-06 ENCOUNTER — Encounter: Payer: Self-pay | Admitting: Internal Medicine

## 2015-01-06 ENCOUNTER — Ambulatory Visit (INDEPENDENT_AMBULATORY_CARE_PROVIDER_SITE_OTHER): Payer: Medicare Other | Admitting: Internal Medicine

## 2015-01-06 ENCOUNTER — Other Ambulatory Visit (INDEPENDENT_AMBULATORY_CARE_PROVIDER_SITE_OTHER): Payer: Medicare Other

## 2015-01-06 VITALS — BP 150/82 | HR 79 | Temp 98.3°F | Wt 147.0 lb

## 2015-01-06 DIAGNOSIS — R21 Rash and other nonspecific skin eruption: Secondary | ICD-10-CM

## 2015-01-06 DIAGNOSIS — M545 Low back pain, unspecified: Secondary | ICD-10-CM

## 2015-01-06 DIAGNOSIS — J0100 Acute maxillary sinusitis, unspecified: Secondary | ICD-10-CM

## 2015-01-06 DIAGNOSIS — J019 Acute sinusitis, unspecified: Secondary | ICD-10-CM | POA: Insufficient documentation

## 2015-01-06 DIAGNOSIS — M79605 Pain in left leg: Principal | ICD-10-CM

## 2015-01-06 LAB — URINALYSIS
BILIRUBIN URINE: NEGATIVE
Hgb urine dipstick: NEGATIVE
Ketones, ur: NEGATIVE
LEUKOCYTES UA: NEGATIVE
Nitrite: NEGATIVE
Specific Gravity, Urine: >=1.03 — AB (ref 1.000–1.030)
Total Protein, Urine: NEGATIVE
Urine Glucose: NEGATIVE
Urobilinogen, UA: 0.2 (ref 0.0–1.0)
pH: 6 (ref 5.0–8.0)

## 2015-01-06 MED ORDER — LEVOFLOXACIN 500 MG PO TABS: 500.0000 mg | ORAL_TABLET | Freq: Every day | ORAL | Status: DC

## 2015-01-06 NOTE — Assessment & Plan Note (Signed)
[employer]

## 2015-01-06 NOTE — Progress Notes (Signed)
Subjective:  Patient ID: [name], female    DOB: 1945-07-21  Age: 69 y.o. MRN: [id number]  CC: No chief complaint on file.   HPI [name] presents for LBP. C/o sinus congestion. Brown d/c x 1 mo.   Outpatient Prescriptions Prior to Visit  Medication Sig Dispense Refill  . acyclovir (ZOVIRAX) 800 MG tablet Take 1 tablet (800 mg total) by mouth 5 (five) times daily. 35 tablet 0  . albuterol (PROVENTIL HFA;VENTOLIN HFA) 108 (90 BASE) MCG/ACT inhaler Inhale 1 puff into the lungs 4 (four) times daily as needed for wheezing or shortness of breath.    . ALPRAZolam (XANAX) 0.5 MG tablet Take 0.5 mg by mouth as needed for sleep.    . ASACOL HD 800 MG TBEC TAKE 3 TABLETS TWICE DAILY 180 each 2  . buPROPion (WELLBUTRIN XL) 300 MG 24 hr tablet Take 1 tablet (300 mg total) by mouth daily. 90 tablet 1  . calcium citrate-vitamin D (CITRACAL+D) 315-200 MG-UNIT per tablet Take 1 tablet by mouth 2 (two) times daily.    . Chlorpheniramine Maleate (ALLERGY PO) Take 1 tablet by mouth daily.    . Cholecalciferol (VITAMIN D PO) Take 1 tablet by mouth daily.    . CONCERTA 36 MG CR tablet     . Cyanocobalamin (VITAMIN B-12 PO) Take 2 tablets by mouth daily.    [name] etodolac (LODINE) 500 MG tablet TAKE ONE TABLET TWICE DAILY 60 tablet 3  . FLUoxetine (PROZAC) 20 MG capsule Take 60 mg by mouth daily.    [name] HYDROmorphone (DILAUDID) 2 MG tablet Take 1-2 tablets (2-4 mg total) by mouth every 4 (four) hours as needed for severe pain. 30 tablet 0  . ibuprofen (ADVIL,MOTRIN) 200 MG tablet Take 800 mg by mouth daily as needed (pain).    . inFLIXimab (REMICADE) 100 MG injection Inject into the vein. Every 2 months    . ketoprofen (ORUDIS) 75 MG capsule     . lamoTRIgine (LAMICTAL) 100 MG tablet     . lamoTRIgine (LAMICTAL) 200 MG tablet Take 200 mg by mouth daily.      [name] levothyroxine (SYNTHROID, LEVOTHROID) 100 MCG tablet Take 100 mcg by mouth daily before breakfast.    . methocarbamol (ROBAXIN) 500 MG  tablet Take 1 tablet (500 mg total) by mouth 4 (four) times daily. 60 tablet 1  . methylphenidate 27 MG PO CR tablet Take one tablet by mouth once daily for ADHD. Do Not Crush 30 tablet 0  . Omega-3 Fatty Acids (OMEGA 3 PO) Take 1 capsule by mouth daily.    . pantoprazole (PROTONIX) 40 MG tablet Take 40 mg by mouth 2 (two) times daily.    . pantoprazole (PROTONIX) 40 MG tablet TAKE ONE TABLET TWICE DAILY 60 tablet 6  . predniSONE (DELTASONE) 20 MG tablet Day 1-3 take 3 pills daily, Days 4-6 take 2 pills daily, Days 7-9 take 1 pill daily then stop. 18 tablet 0  . Probiotic Product (PROBIOTIC PO) Take 1 capsule by mouth daily.    . promethazine (PHENERGAN) 12.5 MG tablet Take 1 tablet (12.5 mg total) by mouth every 6 (six) hours as needed for nausea or vomiting. 30 tablet 3   No facility-administered medications prior to visit.    ROS Review of Systems  Constitutional: Negative for chills.  HENT: Positive for postnasal drip, rhinorrhea and sinus pressure. Negative for sore throat.   Respiratory: Negative for cough and shortness of breath.   Cardiovascular: Negative for leg  swelling.  Gastrointestinal: Negative for blood in stool.  Musculoskeletal: Positive for back pain and gait problem.  Neurological: Negative for light-headedness.    Objective:  BP 150/82 mmHg  Pulse 79  Temp(Src) 98.3 F (36.8 C) (Oral)  Wt 147 lb (66.679 kg)  SpO2 97%  BP Readings from Last 3 Encounters:  01/06/15 150/82  12/22/14 140/80  12/14/14 130/74    Wt Readings from Last 3 Encounters:  01/06/15 147 lb (66.679 kg)  12/22/14 150 lb (68.04 kg)  12/14/14 150 lb (68.04 kg)    Physical Exam  Constitutional: No distress.  Abdominal: She exhibits no mass. There is no tenderness. There is no rebound.  Musculoskeletal: She exhibits tenderness.  Neurological: Coordination abnormal.  Skin: No pallor.  Psychiatric: She has a normal mood and affect. Judgment normal.  Using a walker Rash rsolved  Lab  Results  Component Value Date   WBC 9.2 12/22/2014   HGB 12.1 12/22/2014   HCT 37.3 12/22/2014   PLT 301.0 12/22/2014   GLUCOSE 89 12/22/2014   CHOL 259* 03/30/2014   TRIG 177.0* 03/30/2014   HDL 47.40 03/30/2014   LDLDIRECT 182.5 02/02/2008   LDLCALC 176* 03/30/2014   ALT 13 12/22/2014   AST 12 12/22/2014   NA 141 12/22/2014   K 4.1 12/22/2014   CL 105 12/22/2014   CREATININE 0.73 12/22/2014   BUN 14 12/22/2014   CO2 31 12/22/2014   TSH 1.06 12/22/2014   INR 1.0 11/25/2008   HGBA1C 5.5 04/24/2012    Dg Chest 2 View  04/20/2014   CLINICAL DATA:  Hypertension.  EXAM: CHEST  2 VIEW  COMPARISON:  08/09/2011.  11/25/2008  FINDINGS: Mediastinum and hilar structures are normal. The lungs are clear. Nodular opacity projected over the mid lungs on lateral view only is stable from multiple prior exam and may be related to overlapping shadows. Heart size normal. No pleural effusion or pneumothorax. Right shoulder replacement. Degenerative changes thoracic spine. Surgical clips right upper quadrant.  IMPRESSION: No active cardiopulmonary disease.   Electronically Signed   By: Marcello Moores  Register   On: 04/20/2014 10:09    Assessment & Plan:   Diagnoses and all orders for this visit:  Rash of back  LBP radiating to left leg Orders: -     Urinalysis; Future  Acute maxillary sinusitis, recurrence not specified  Other orders -     levofloxacin (LEVAQUIN) 500 MG tablet; Take 1 tablet (500 mg total) by mouth daily.   I am having [name] start on levofloxacin. I am also having her maintain her lamoTRIgine, ALPRAZolam, inFLIXimab, FLUoxetine, pantoprazole, albuterol, Omega-3 Fatty Acids (OMEGA 3 PO), Probiotic Product (PROBIOTIC PO), Cholecalciferol (VITAMIN D PO), promethazine, Cyanocobalamin (VITAMIN B-12 PO), calcium citrate-vitamin D, Chlorpheniramine Maleate (ALLERGY PO), ibuprofen, levothyroxine, methylphenidate, etodolac, ASACOL HD, predniSONE, methocarbamol, HYDROmorphone,  buPROPion, ketoprofen, CONCERTA, lamoTRIgine, acyclovir, and pantoprazole.  Meds ordered this encounter  Medications  . levofloxacin (LEVAQUIN) 500 MG tablet    Sig: Take 1 tablet (500 mg total) by mouth daily.    Dispense:  10 tablet    Refill:  0     Follow-up: No Follow-up on file.  Walker Kehr, MD

## 2015-01-06 NOTE — Assessment & Plan Note (Signed)
Burn - resolved

## 2015-01-06 NOTE — Assessment & Plan Note (Signed)
UA

## 2015-01-06 NOTE — Progress Notes (Signed)
Pre visit review using our clinic review tool, if applicable. No additional management support is needed unless otherwise documented below in the visit note. 

## 2015-01-12 ENCOUNTER — Other Ambulatory Visit: Payer: Self-pay | Admitting: Internal Medicine

## 2015-01-16 NOTE — Telephone Encounter (Signed)
Sent to pharmacy 

## 2015-01-18 DIAGNOSIS — K5190 Ulcerative colitis, unspecified, without complications: Secondary | ICD-10-CM | POA: Diagnosis not present

## 2015-01-25 ENCOUNTER — Ambulatory Visit: Payer: Medicare Other | Admitting: Internal Medicine

## 2015-01-26 ENCOUNTER — Ambulatory Visit (INDEPENDENT_AMBULATORY_CARE_PROVIDER_SITE_OTHER): Payer: Medicare Other | Admitting: Internal Medicine

## 2015-01-26 ENCOUNTER — Other Ambulatory Visit (INDEPENDENT_AMBULATORY_CARE_PROVIDER_SITE_OTHER): Payer: Medicare Other

## 2015-01-26 ENCOUNTER — Encounter: Payer: Self-pay | Admitting: Internal Medicine

## 2015-01-26 ENCOUNTER — Ambulatory Visit (INDEPENDENT_AMBULATORY_CARE_PROVIDER_SITE_OTHER)
Admission: RE | Admit: 2015-01-26 | Discharge: 2015-01-26 | Disposition: A | Discharge status: Home / Self Care | Payer: Medicare Other | Source: Ambulatory Visit | Attending: Internal Medicine | Admitting: Internal Medicine

## 2015-01-26 VITALS — BP 140/78 | HR 76 | Temp 98.1°F | Resp 16 | Ht 66.0 in | Wt 151.0 lb

## 2015-01-26 DIAGNOSIS — R312 Other microscopic hematuria: Secondary | ICD-10-CM

## 2015-01-26 DIAGNOSIS — I10 Essential (primary) hypertension: Secondary | ICD-10-CM | POA: Diagnosis not present

## 2015-01-26 DIAGNOSIS — M545 Low back pain, unspecified: Secondary | ICD-10-CM

## 2015-01-26 DIAGNOSIS — M79605 Pain in left leg: Secondary | ICD-10-CM

## 2015-01-26 DIAGNOSIS — R3129 Other microscopic hematuria: Secondary | ICD-10-CM

## 2015-01-26 DIAGNOSIS — Z981 Arthrodesis status: Secondary | ICD-10-CM | POA: Diagnosis not present

## 2015-01-26 LAB — URINALYSIS, ROUTINE W REFLEX MICROSCOPIC
BILIRUBIN URINE: NEGATIVE
HGB URINE DIPSTICK: NEGATIVE
Ketones, ur: NEGATIVE
NITRITE: NEGATIVE
Specific Gravity, Urine: 1.025 (ref 1.000–1.030)
Total Protein, Urine: NEGATIVE
Urine Glucose: NEGATIVE
Urobilinogen, UA: 0.2 (ref 0.0–1.0)
pH: 6 (ref 5.0–8.0)

## 2015-01-26 NOTE — Progress Notes (Signed)
Pre visit review using our clinic review tool, if applicable. No additional management support is needed unless otherwise documented below in the visit note. 

## 2015-01-26 NOTE — Patient Instructions (Signed)
Back Pain, Adult Low back pain is very common. About 1 in 5 people have back pain.The cause of low back pain is rarely dangerous. The pain often gets better over time.About half of people with a sudden onset of back pain feel better in just 2 weeks. About 8 in 10 people feel better by 6 weeks.  CAUSES Some common causes of back pain include:  Strain of the muscles or ligaments supporting the spine.  Wear and tear (degeneration) of the spinal discs.  Arthritis.  Direct injury to the back. DIAGNOSIS Most of the time, the direct cause of low back pain is not known.However, back pain can be treated effectively even when the exact cause of the pain is unknown.Answering your caregiver's questions about your overall health and symptoms is one of the most accurate ways to make sure the cause of your pain is not dangerous. If your caregiver needs more information, he or she may order lab work or imaging tests (X-rays or MRIs).However, even if imaging tests show changes in your back, this usually does not require surgery. HOME CARE INSTRUCTIONS For many people, back pain returns.Since low back pain is rarely dangerous, it is often a condition that people can learn to manageon their own.   Remain active. It is stressful on the back to sit or stand in one place. Do not sit, drive, or stand in one place for more than 30 minutes at a time. Take short walks on level surfaces as soon as pain allows.Try to increase the length of time you walk each day.  Do not stay in bed.Resting more than 1 or 2 days can delay your recovery.  Do not avoid exercise or work.Your body is made to move.It is not dangerous to be active, even though your back may hurt.Your back will likely heal faster if you return to being active before your pain is gone.  Pay attention to your body when you bend and lift. Many people have less discomfortwhen lifting if they bend their knees, keep the load close to their bodies,and  avoid twisting. Often, the most comfortable positions are those that put less stress on your recovering back.  Find a comfortable position to sleep. Use a firm mattress and lie on your side with your knees slightly bent. If you lie on your back, put a pillow under your knees.  Only take over-the-counter or prescription medicines as directed by your caregiver. Over-the-counter medicines to reduce pain and inflammation are often the most helpful.Your caregiver may prescribe muscle relaxant drugs.These medicines help dull your pain so you can more quickly return to your normal activities and healthy exercise.  Put ice on the injured area.  Put ice in a plastic bag.  Place a towel between your skin and the bag.  Leave the ice on for 15-20 minutes, 03-04 times a day for the first 2 to 3 days. After that, ice and heat may be alternated to reduce pain and spasms.  Ask your caregiver about trying back exercises and gentle massage. This may be of some benefit.  Avoid feeling anxious or stressed.Stress increases muscle tension and can worsen back pain.It is important to recognize when you are anxious or stressed and learn ways to manage it.Exercise is a great option. SEEK MEDICAL CARE IF:  You have pain that is not relieved with rest or medicine.  You have pain that does not improve in 1 week.  You have new symptoms.  You are generally not feeling well. SEEK   IMMEDIATE MEDICAL CARE IF:   You have pain that radiates from your back into your legs.  You develop new bowel or bladder control problems.  You have unusual weakness or numbness in your arms or legs.  You develop nausea or vomiting.  You develop abdominal pain.  You feel faint. Document Released: 06/10/2005 Document Revised: 12/10/2011 Document Reviewed: 10/12/2013 [employer] Patient Information 2015 [employer]. This information is not intended to replace advice given to you by your health care provider. Make sure you  discuss any questions you have with your health care provider.  

## 2015-01-27 ENCOUNTER — Other Ambulatory Visit: Payer: Self-pay | Admitting: Internal Medicine

## 2015-01-30 NOTE — Progress Notes (Signed)
Subjective:  Patient ID: [name], female    DOB: Dec 23, 1945  Age: 69 y.o. MRN: [id number]  CC: Back Pain   HPI [name] presents for low back pain. She has chronic low blood back pain after prior history of extensive lumbar surgery and repair of left hip fracture. She sees her neurosurgeon next week. She complains of a stabbing pain in her left lower back that radiates up into her flank area and occasionally into her left lower extremity. She is here today because she is concerned that that might be something wrong with her urine. She takes Dilaudid for the back pain and it helps control the pain.   Outpatient Prescriptions Prior to Visit  Medication Sig Dispense Refill  . acyclovir (ZOVIRAX) 800 MG tablet Take 1 tablet (800 mg total) by mouth 5 (five) times daily. 35 tablet 0  . albuterol (PROVENTIL HFA;VENTOLIN HFA) 108 (90 BASE) MCG/ACT inhaler Inhale 1 puff into the lungs 4 (four) times daily as needed for wheezing or shortness of breath.    . ALPRAZolam (XANAX) 0.5 MG tablet TAKE ONE TABLET AT BEDTIME AS NEEDED 30 tablet 3  . ASACOL HD 800 MG TBEC TAKE 3 TABLETS TWICE DAILY 180 each 2  . buPROPion (WELLBUTRIN XL) 300 MG 24 hr tablet Take 1 tablet (300 mg total) by mouth daily. 90 tablet 1  . calcium citrate-vitamin D (CITRACAL+D) 315-200 MG-UNIT per tablet Take 1 tablet by mouth 2 (two) times daily.    . Chlorpheniramine Maleate (ALLERGY PO) Take 1 tablet by mouth daily.    . Cholecalciferol (VITAMIN D PO) Take 1 tablet by mouth daily.    . CONCERTA 36 MG CR tablet     . Cyanocobalamin (VITAMIN B-12 PO) Take 2 tablets by mouth daily.    [name] etodolac (LODINE) 500 MG tablet TAKE ONE TABLET TWICE DAILY 60 tablet 3  . FLUoxetine (PROZAC) 20 MG capsule Take 60 mg by mouth daily.    [name] HYDROmorphone (DILAUDID) 2 MG tablet Take 1-2 tablets (2-4 mg total) by mouth every 4 (four) hours as needed for severe pain. 30 tablet 0  . ibuprofen (ADVIL,MOTRIN) 200 MG tablet Take 800 mg by  mouth daily as needed (pain).    . inFLIXimab (REMICADE) 100 MG injection Inject into the vein. Every 2 months    . ketoprofen (ORUDIS) 75 MG capsule     . lamoTRIgine (LAMICTAL) 200 MG tablet Take 300 mg by mouth daily.     . methocarbamol (ROBAXIN) 500 MG tablet Take 1 tablet (500 mg total) by mouth 4 (four) times daily. 60 tablet 1  . methylphenidate 27 MG PO CR tablet Take one tablet by mouth once daily for ADHD. Do Not Crush 30 tablet 0  . Omega-3 Fatty Acids (OMEGA 3 PO) Take 1 capsule by mouth daily.    . pantoprazole (PROTONIX) 40 MG tablet TAKE ONE TABLET TWICE DAILY 60 tablet 6  . Probiotic Product (PROBIOTIC PO) Take 1 capsule by mouth daily.    . promethazine (PHENERGAN) 12.5 MG tablet Take 1 tablet (12.5 mg total) by mouth every 6 (six) hours as needed for nausea or vomiting. 30 tablet 3  . lamoTRIgine (LAMICTAL) 100 MG tablet     . levofloxacin (LEVAQUIN) 500 MG tablet Take 1 tablet (500 mg total) by mouth daily. 10 tablet 0  . levothyroxine (SYNTHROID, LEVOTHROID) 100 MCG tablet Take 100 mcg by mouth daily before breakfast.    . pantoprazole (PROTONIX) 40 MG tablet Take 40  mg by mouth 2 (two) times daily.    . predniSONE (DELTASONE) 20 MG tablet Day 1-3 take 3 pills daily, Days 4-6 take 2 pills daily, Days 7-9 take 1 pill daily then stop. 18 tablet 0   No facility-administered medications prior to visit.    ROS Review of Systems  Constitutional: Positive for fatigue. Negative for fever, chills, diaphoresis and appetite change.  HENT: Negative.   Eyes: Negative.   Respiratory: Negative.  Negative for cough, choking, chest tightness, shortness of breath and stridor.   Cardiovascular: Negative.  Negative for chest pain, palpitations and leg swelling.  Gastrointestinal: Negative.  Negative for nausea, vomiting, abdominal pain, diarrhea, constipation and blood in stool.  Endocrine: Negative.   Genitourinary: Negative.  Negative for dysuria, urgency, frequency, hematuria, flank  pain, decreased urine volume and difficulty urinating.  Musculoskeletal: Positive for back pain. Negative for myalgias, joint swelling, arthralgias, gait problem, neck pain and neck stiffness.  Skin: Negative.   Allergic/Immunologic: Negative.   Neurological: Negative.   Hematological: Negative.  Negative for adenopathy. Does not bruise/bleed easily.  Psychiatric/Behavioral: Negative.     Objective:  BP 140/78 mmHg  Pulse 76  Temp(Src) 98.1 F (36.7 C) (Oral)  Ht 5\' 6"  (1.676 m)  Wt 151 lb (68.493 kg)  BMI 24.38 kg/m2  SpO2 97%  BP Readings from Last 3 Encounters:  01/26/15 140/78  01/06/15 150/82  12/22/14 140/80    Wt Readings from Last 3 Encounters:  01/26/15 151 lb (68.493 kg)  01/06/15 147 lb (66.679 kg)  12/22/14 150 lb (68.04 kg)    Physical Exam  Constitutional: She is oriented to person, place, and time. No distress.  HENT:  Mouth/Throat: Oropharynx is clear and moist. No oropharyngeal exudate.  Eyes: Conjunctivae are normal. Right eye exhibits no discharge. Left eye exhibits no discharge. No scleral icterus.  Neck: Normal range of motion. Neck supple. No JVD present. No tracheal deviation present. No thyromegaly present.  Cardiovascular: Normal rate, regular rhythm, normal heart sounds and intact distal pulses.  Exam reveals no gallop and no friction rub.   No murmur heard. Pulmonary/Chest: Effort normal and breath sounds normal. No stridor. No respiratory distress. She has no wheezes. She has no rales. She exhibits no tenderness.  Abdominal: Soft. Bowel sounds are normal. She exhibits no distension and no mass. There is no tenderness. There is no rebound and no guarding.  Musculoskeletal: She exhibits no edema.       Lumbar back: She exhibits decreased range of motion and tenderness. She exhibits no bony tenderness, no swelling, no edema, no deformity, no laceration, no pain, no spasm and normal pulse.  Lymphadenopathy:    She has no cervical adenopathy.    Neurological: She is oriented to person, place, and time. She has normal strength. She displays no atrophy, no tremor and normal reflexes. No cranial nerve deficit or sensory deficit. She exhibits normal muscle tone. She displays a negative Romberg sign. She displays no seizure activity. Coordination and gait normal.  Reflex Scores:      Tricep reflexes are 1+ on the right side and 1+ on the left side.      Bicep reflexes are 1+ on the right side and 1+ on the left side.      Brachioradialis reflexes are 1+ on the right side and 1+ on the left side.      Patellar reflexes are 1+ on the right side and 1+ on the left side.      Achilles reflexes are  1+ on the right side and 1+ on the left side. Negative straight leg raise in bilateral lower extremities  Skin: Skin is warm and dry. No rash noted. She is not diaphoretic. No erythema. No pallor.    Lab Results  Component Value Date   WBC 9.2 12/22/2014   HGB 12.1 12/22/2014   HCT 37.3 12/22/2014   PLT 301.0 12/22/2014   GLUCOSE 89 12/22/2014   CHOL 259* 03/30/2014   TRIG 177.0* 03/30/2014   HDL 47.40 03/30/2014   LDLDIRECT 182.5 02/02/2008   LDLCALC 176* 03/30/2014   ALT 13 12/22/2014   AST 12 12/22/2014   NA 141 12/22/2014   K 4.1 12/22/2014   CL 105 12/22/2014   CREATININE 0.73 12/22/2014   BUN 14 12/22/2014   CO2 31 12/22/2014   TSH 1.06 12/22/2014   INR 1.0 11/25/2008   HGBA1C 5.5 04/24/2012    Dg Lumbar Spine Complete  01/26/2015   CLINICAL DATA:  Back pain.  Pain radiates to right leg.  EXAM: LUMBAR SPINE - COMPLETE 4+ VIEW  COMPARISON:  07/27/2014.  07/04/2014.  FINDINGS: Cholecystectomy. Pelvic clips. Left hip screw. L1 through S1 posterior and interbody fusion. Degenerative endplate osteophytes and Schmorl's nodes are again noted. No acute bony abnormality identified. Mild stable old lumbar compressions are noted. Aortoiliac atherosclerotic vascular disease .  IMPRESSION: Stable exam. Patient has had prior L1 through S1  posterior and interbody fusion. No acute abnormality.   Electronically Signed   By: Marcello Moores  Register   On: 01/26/2015 13:49    Assessment & Plan:   Burnell was seen today for back pain.  Diagnoses and all orders for this visit:  Essential hypertension- her blood pressure is well-controlled Orders: -     Urinalysis, Routine w reflex microscopic (not at [facility]); Future  Microscopic hematuria- her urinalysis is normal Orders: -     Urinalysis, Routine w reflex microscopic (not at [facility]); Future  LBP radiating to left leg- her exam and plain film showed no changes in the status of her lower back. 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 I encouraged her to use the Dilaudid more for pain relief. Orders: -     DG Lumbar Spine Complete; Future   I have discontinued Ms. Scarlett's predniSONE and levofloxacin. I am also having her maintain her lamoTRIgine, inFLIXimab, FLUoxetine, albuterol, Omega-3 Fatty Acids (OMEGA 3 PO), Probiotic Product (PROBIOTIC PO), Cholecalciferol (VITAMIN D PO), promethazine, Cyanocobalamin (VITAMIN B-12 PO), calcium citrate-vitamin D, Chlorpheniramine Maleate (ALLERGY PO), ibuprofen, methylphenidate, etodolac, ASACOL HD, methocarbamol, HYDROmorphone, buPROPion, ketoprofen, CONCERTA, acyclovir, pantoprazole, and ALPRAZolam.  No orders of the defined types were placed in this encounter.     Follow-up: Return in about 4 weeks (around 02/23/2015).  Tammy Calico, MD

## 2015-02-20 DIAGNOSIS — M4120 Other idiopathic scoliosis, site unspecified: Secondary | ICD-10-CM | POA: Diagnosis not present

## 2015-02-20 DIAGNOSIS — M419 Scoliosis, unspecified: Secondary | ICD-10-CM | POA: Diagnosis not present

## 2015-02-20 DIAGNOSIS — R030 Elevated blood-pressure reading, without diagnosis of hypertension: Secondary | ICD-10-CM | POA: Diagnosis not present

## 2015-02-20 DIAGNOSIS — M5416 Radiculopathy, lumbar region: Secondary | ICD-10-CM | POA: Diagnosis not present

## 2015-02-20 DIAGNOSIS — M545 Low back pain: Secondary | ICD-10-CM | POA: Diagnosis not present

## 2015-02-28 DIAGNOSIS — M25511 Pain in right shoulder: Secondary | ICD-10-CM | POA: Diagnosis not present

## 2015-02-28 DIAGNOSIS — Z471 Aftercare following joint replacement surgery: Secondary | ICD-10-CM | POA: Diagnosis not present

## 2015-02-28 DIAGNOSIS — Z96611 Presence of right artificial shoulder joint: Secondary | ICD-10-CM | POA: Diagnosis not present

## 2015-03-01 DIAGNOSIS — M25551 Pain in right hip: Secondary | ICD-10-CM | POA: Diagnosis not present

## 2015-03-01 DIAGNOSIS — K5289 Other specified noninfective gastroenteritis and colitis: Secondary | ICD-10-CM | POA: Diagnosis not present

## 2015-03-01 DIAGNOSIS — M7061 Trochanteric bursitis, right hip: Secondary | ICD-10-CM | POA: Diagnosis not present

## 2015-03-01 DIAGNOSIS — K5100 Ulcerative (chronic) pancolitis without complications: Secondary | ICD-10-CM | POA: Diagnosis not present

## 2015-03-01 DIAGNOSIS — M160 Bilateral primary osteoarthritis of hip: Secondary | ICD-10-CM | POA: Diagnosis not present

## 2015-03-01 DIAGNOSIS — M25552 Pain in left hip: Secondary | ICD-10-CM | POA: Diagnosis not present

## 2015-03-06 DIAGNOSIS — F900 Attention-deficit hyperactivity disorder, predominantly inattentive type: Secondary | ICD-10-CM | POA: Diagnosis not present

## 2015-03-06 DIAGNOSIS — F39 Unspecified mood [affective] disorder: Secondary | ICD-10-CM | POA: Diagnosis not present

## 2015-03-08 ENCOUNTER — Other Ambulatory Visit: Payer: Self-pay | Admitting: Neurosurgery

## 2015-03-08 DIAGNOSIS — M4120 Other idiopathic scoliosis, site unspecified: Secondary | ICD-10-CM

## 2015-03-16 DIAGNOSIS — F900 Attention-deficit hyperactivity disorder, predominantly inattentive type: Secondary | ICD-10-CM | POA: Diagnosis not present

## 2015-03-16 DIAGNOSIS — F39 Unspecified mood [affective] disorder: Secondary | ICD-10-CM | POA: Diagnosis not present

## 2015-03-20 ENCOUNTER — Ambulatory Visit
Admission: RE | Admit: 2015-03-20 | Discharge: 2015-03-20 | Disposition: A | Discharge status: Home / Self Care | Payer: Medicare Other | Source: Ambulatory Visit | Attending: Neurosurgery | Admitting: Neurosurgery

## 2015-03-20 DIAGNOSIS — R200 Anesthesia of skin: Secondary | ICD-10-CM | POA: Diagnosis not present

## 2015-03-20 DIAGNOSIS — M4120 Other idiopathic scoliosis, site unspecified: Secondary | ICD-10-CM

## 2015-03-21 ENCOUNTER — Other Ambulatory Visit: Payer: Medicare Other

## 2015-03-23 DIAGNOSIS — K5100 Ulcerative (chronic) pancolitis without complications: Secondary | ICD-10-CM | POA: Diagnosis not present

## 2015-03-23 DIAGNOSIS — K529 Noninfective gastroenteritis and colitis, unspecified: Secondary | ICD-10-CM | POA: Diagnosis not present

## 2015-03-23 DIAGNOSIS — K5289 Other specified noninfective gastroenteritis and colitis: Secondary | ICD-10-CM | POA: Diagnosis not present

## 2015-04-06 DIAGNOSIS — F39 Unspecified mood [affective] disorder: Secondary | ICD-10-CM | POA: Diagnosis not present

## 2015-04-07 DIAGNOSIS — M7072 Other bursitis of hip, left hip: Secondary | ICD-10-CM | POA: Diagnosis not present

## 2015-04-07 DIAGNOSIS — M25551 Pain in right hip: Secondary | ICD-10-CM | POA: Diagnosis not present

## 2015-04-07 DIAGNOSIS — M7071 Other bursitis of hip, right hip: Secondary | ICD-10-CM | POA: Diagnosis not present

## 2015-04-07 DIAGNOSIS — M25552 Pain in left hip: Secondary | ICD-10-CM | POA: Diagnosis not present

## 2015-04-10 DIAGNOSIS — F39 Unspecified mood [affective] disorder: Secondary | ICD-10-CM | POA: Diagnosis not present

## 2015-04-10 DIAGNOSIS — F900 Attention-deficit hyperactivity disorder, predominantly inattentive type: Secondary | ICD-10-CM | POA: Diagnosis not present

## 2015-04-24 DIAGNOSIS — M47816 Spondylosis without myelopathy or radiculopathy, lumbar region: Secondary | ICD-10-CM | POA: Diagnosis not present

## 2015-04-24 DIAGNOSIS — Z6825 Body mass index (BMI) 25.0-25.9, adult: Secondary | ICD-10-CM | POA: Diagnosis not present

## 2015-04-24 DIAGNOSIS — M5416 Radiculopathy, lumbar region: Secondary | ICD-10-CM | POA: Diagnosis not present

## 2015-04-24 DIAGNOSIS — R030 Elevated blood-pressure reading, without diagnosis of hypertension: Secondary | ICD-10-CM | POA: Diagnosis not present

## 2015-04-24 DIAGNOSIS — M4120 Other idiopathic scoliosis, site unspecified: Secondary | ICD-10-CM | POA: Diagnosis not present

## 2015-04-24 DIAGNOSIS — M545 Low back pain: Secondary | ICD-10-CM | POA: Diagnosis not present

## 2015-04-29 ENCOUNTER — Encounter: Payer: Self-pay | Admitting: Internal Medicine

## 2015-04-29 DIAGNOSIS — F17200 Nicotine dependence, unspecified, uncomplicated: Secondary | ICD-10-CM | POA: Diagnosis not present

## 2015-04-29 DIAGNOSIS — I10 Essential (primary) hypertension: Secondary | ICD-10-CM | POA: Diagnosis not present

## 2015-05-05 ENCOUNTER — Other Ambulatory Visit (INDEPENDENT_AMBULATORY_CARE_PROVIDER_SITE_OTHER): Payer: Medicare Other

## 2015-05-05 ENCOUNTER — Encounter: Payer: Self-pay | Admitting: Internal Medicine

## 2015-05-05 ENCOUNTER — Ambulatory Visit (INDEPENDENT_AMBULATORY_CARE_PROVIDER_SITE_OTHER): Payer: Medicare Other | Admitting: Internal Medicine

## 2015-05-05 VITALS — BP 150/74 | HR 81 | Temp 98.6°F | Resp 18 | Ht 64.0 in | Wt 151.8 lb

## 2015-05-05 DIAGNOSIS — E039 Hypothyroidism, unspecified: Secondary | ICD-10-CM

## 2015-05-05 DIAGNOSIS — R42 Dizziness and giddiness: Secondary | ICD-10-CM | POA: Diagnosis not present

## 2015-05-05 DIAGNOSIS — I10 Essential (primary) hypertension: Secondary | ICD-10-CM | POA: Diagnosis not present

## 2015-05-05 LAB — CBC
HCT: 40.6 % (ref 36.0–46.0)
Hemoglobin: 13 g/dL (ref 12.0–15.0)
MCHC: 32 g/dL (ref 30.0–36.0)
MCV: 83.6 fl (ref 78.0–100.0)
PLATELETS: 327 10*3/uL (ref 150.0–400.0)
RBC: 4.86 Mil/uL (ref 3.87–5.11)
RDW: 16.2 % — ABNORMAL HIGH (ref 11.5–15.5)
WBC: 9.5 10*3/uL (ref 4.0–10.5)

## 2015-05-05 LAB — COMPREHENSIVE METABOLIC PANEL
ALT: 12 U/L (ref 0–35)
AST: 11 U/L (ref 0–37)
Albumin: 4.2 g/dL (ref 3.5–5.2)
Alkaline Phosphatase: 81 U/L (ref 39–117)
BUN: 20 mg/dL (ref 6–23)
CO2: 30 mEq/L (ref 19–32)
Calcium: 9.8 mg/dL (ref 8.4–10.5)
Chloride: 103 mEq/L (ref 96–112)
Creatinine, Ser: 0.82 mg/dL (ref 0.40–1.20)
GFR: 73.38 mL/min (ref >60.00)
Glucose, Bld: 81 mg/dL (ref 70–99)
Potassium: 4.5 mEq/L (ref 3.5–5.1)
SODIUM: 141 meq/L (ref 135–145)
Total Bilirubin: 0.3 mg/dL (ref 0.2–1.2)
Total Protein: 6.9 g/dL (ref 6.0–8.3)

## 2015-05-05 LAB — T4, FREE: Free T4: 0.98 ng/dL (ref 0.60–1.60)

## 2015-05-05 LAB — TSH: TSH: 0.77 u[IU]/mL (ref 0.35–4.50)

## 2015-05-05 MED ORDER — METOPROLOL SUCCINATE ER 25 MG PO TB24: 25.0000 mg | ORAL_TABLET | Freq: Every day | ORAL | Status: DC

## 2015-05-05 NOTE — Progress Notes (Signed)
   Subjective:    Patient ID: [name], female    DOB: [birth year], 69 y.o.   MRN: [id number]  HPI The patient is a 69 YO female coming in for hospital follow up (hospitalized for high blood pressure, records not available to me at time of visit to review). Her neurologist started her on hctz 12.5 mg daily and this has brought her BP down some. She is still having some high pressures. Having some side effects from the medicine. Some shaking and weakness in her legs. Feeling like she has some dry mouth. No fevers or chills and no recent infection. Weight is up about 3 pounds and her rheumatologist stopped her remicaid (not working) and started [employer] for the pain.   PMH, [facility], social history reviewed and updated.   Review of Systems  Constitutional: Positive for fatigue. Negative for fever, activity change and appetite change.  HENT: Negative.   Eyes: Negative.   Respiratory: Negative for cough, chest tightness, shortness of breath and wheezing.   Cardiovascular: Negative for chest pain, palpitations and leg swelling.  Gastrointestinal: Negative for constipation and abdominal distention.  Genitourinary: Negative.   Musculoskeletal: Positive for back pain, arthralgias and gait problem.  Skin: Negative.   Neurological: Positive for tremors, weakness and light-headedness. Negative for dizziness, syncope and numbness.      Objective:   Physical Exam  Constitutional: She is oriented to person, place, and time. She appears well-developed and well-nourished. No distress.  HENT:  Head: Normocephalic and atraumatic.  Eyes: EOM are normal.  Neck: Normal range of motion. No JVD present.  Cardiovascular: Normal rate and regular rhythm.   Pulmonary/Chest: Effort normal and breath sounds normal. No respiratory distress. She has no wheezes. She has no rales.  Abdominal: Soft. Bowel sounds are normal.  Musculoskeletal:  Some shaking on exam, able to calm with concentration  Lymphadenopathy:   She has no cervical adenopathy.  Neurological: She is alert and oriented to person, place, and time. Coordination abnormal.  Walks with cane.  Skin: Skin is warm and dry.   Filed Vitals:   05/05/15 1020  BP: 150/74  Pulse: 81  Temp: 98.6 F (37 C)  TempSrc: Oral  Resp: 18  Height: 5' 4"  (1.626 m)  Weight: 151 lb 12.8 oz (68.856 kg)  SpO2: 96%      Assessment & Plan:

## 2015-05-05 NOTE — Progress Notes (Signed)
Pre visit review using our clinic review tool, if applicable. No additional management support is needed unless otherwise documented below in the visit note. 

## 2015-05-05 NOTE — Assessment & Plan Note (Signed)
Concern that her thyroid dosing is too high given the tremors on exam today. Checking TSH and free T4 and adjust as needed.

## 2015-05-05 NOTE — Assessment & Plan Note (Signed)
Concern for worsening with recent addition of hctz. Will stop and change with another medication. She is not orthostatic in the office and has taken the medicine today.

## 2015-05-05 NOTE — Patient Instructions (Signed)
We will have you stop taking the hydrochlorothiazide.   We have sent in a different medicine for the blood pressure called metoprolol. Take 1 pill daily and this should help to get rid of some of the side effects of the medicine.  We are checking the labs today and will get back with you about the results.

## 2015-05-05 NOTE — Assessment & Plan Note (Signed)
BP has markedly elevated since last visit. Some concern that this is from NSAIDs and mild weight change. Did inform her that she should not take ibuprofen or motrin on top of meloxicam. Checking CMP and CBC to see if she has complication from the hctz. Will stop hctz as concern exists she is having a side effect (significant and making her fall more) and change to metoprolol-xl (will also help with the mild tremor).

## 2015-05-06 ENCOUNTER — Encounter: Payer: Self-pay | Admitting: Internal Medicine

## 2015-05-08 ENCOUNTER — Ambulatory Visit (INDEPENDENT_AMBULATORY_CARE_PROVIDER_SITE_OTHER): Payer: Medicare Other | Admitting: Internal Medicine

## 2015-05-08 ENCOUNTER — Other Ambulatory Visit: Payer: Self-pay | Admitting: Internal Medicine

## 2015-05-08 ENCOUNTER — Encounter: Payer: Self-pay | Admitting: Internal Medicine

## 2015-05-08 VITALS — BP 154/74 | HR 67 | Temp 98.2°F | Resp 16 | Wt 154.0 lb

## 2015-05-08 DIAGNOSIS — R4781 Slurred speech: Secondary | ICD-10-CM | POA: Diagnosis not present

## 2015-05-08 DIAGNOSIS — R29818 Other symptoms and signs involving the nervous system: Secondary | ICD-10-CM | POA: Diagnosis not present

## 2015-05-08 DIAGNOSIS — R2689 Other abnormalities of gait and mobility: Secondary | ICD-10-CM

## 2015-05-08 DIAGNOSIS — F05 Delirium due to known physiological condition: Secondary | ICD-10-CM

## 2015-05-08 DIAGNOSIS — R410 Disorientation, unspecified: Secondary | ICD-10-CM

## 2015-05-08 MED ORDER — AMLODIPINE BESYLATE 5 MG PO TABS: 5.0000 mg | ORAL_TABLET | Freq: Every day | ORAL | Status: DC

## 2015-05-08 NOTE — Progress Notes (Signed)
Pre visit review using our clinic review tool, if applicable. No additional management support is needed unless otherwise documented below in the visit note. 

## 2015-05-08 NOTE — Progress Notes (Signed)
Subjective:    Patient ID: [name], female    DOB: Dec 30, 1945, 69 y.o.   MRN: [id number]  HPI She is here for an acute visit for shaking in her hands/arms, being off balance ( more than usual) and slurring her words (her daughter noticed it).  She has notice that she is "slow" or confused at times.  Shaking:  She first noticed it months ago but it has been worse this past week.  She noticed it only when she tries to do something with her arms,hands.  She had major back surgery one year ago and wonders if that is related.  No family history of tremor or shakes.  She denies shaking of the head or legs.    Balance:  Her balance has been bad for a while because of her back pain and major back surgery last year, but has been worse since starting the blood pressure medication.  She fell yesterday.  Her balance is worse since starting the amlodipine.  She does not feel that she has high blood pressure.  Slurring of her words, confusion/forgetfullness:  Her daughter told her last week that her granddaughter had a game on Sunday at 9:30 - she went to the field on Saturday at 1pm.  Her duagher noticed she has been forgetting things, slow to process, slurring of words (mentioned that today).    Besides the blood pressure medications the only other change in medication in the past 3-4 months was one medication, but she can not remember it.  She follow up with her neurosurgeon in her psychiatrist. She recently saw her psychiatrist who felt she was good on all her current medications.  She states she is taking her medication daily as prescribed.   Medications and allergies reviewed with patient and updated if appropriate.  Patient Active Problem List   Diagnosis Date Noted  . LBP radiating to left leg 12/22/2014  . Dizziness 08/11/2014  . IBS (irritable bowel syndrome) 05/02/2014  . DDD (degenerative disc disease), lumbosacral 04/21/2014  . Renal cyst 01/28/2014  . Microscopic hematuria  04/20/2013  . Ulcerative colitis ([place]) 05/06/2012  . Spinal disease 03/29/2012  . Arthralgia 11/01/2011  . Osteoarthritis 08/17/2010  . GERD 08/17/2010  . Hyperlipidemia 02/19/2008  . ALLERGIC RHINITIS CAUSE UNSPECIFIED 05/26/2007  . Hypothyroidism 03/21/2007  . Anxiety state 03/21/2007  . Depression 03/21/2007  . Attention deficit disorder of adult 03/21/2007  . Essential hypertension 03/21/2007  . Asthma 03/21/2007  . IRRITABLE BOWEL SYNDROME 03/21/2007    Current Outpatient Prescriptions on File Prior to Visit  Medication Sig Dispense Refill  . albuterol (PROVENTIL HFA;VENTOLIN HFA) 108 (90 BASE) MCG/ACT inhaler Inhale 1 puff into the lungs 4 (four) times daily as needed for wheezing or shortness of breath.    . ALPRAZolam (XANAX) 0.5 MG tablet TAKE ONE TABLET AT BEDTIME AS NEEDED 30 tablet 3  . ASACOL HD 800 MG TBEC TAKE 3 TABLETS TWICE DAILY 180 each 2  . buPROPion (WELLBUTRIN XL) 300 MG 24 hr tablet Take 1 tablet (300 mg total) by mouth daily. 90 tablet 1  . calcium citrate-vitamin D (CITRACAL+D) 315-200 MG-UNIT per tablet Take 1 tablet by mouth 2 (two) times daily.    . Chlorpheniramine Maleate (ALLERGY PO) Take 1 tablet by mouth daily.    . Cholecalciferol (VITAMIN D PO) Take 1 tablet by mouth daily.    . CONCERTA 36 MG CR tablet Take 36 mg by mouth daily.     . Cyanocobalamin (VITAMIN  B-12 PO) Take 2 tablets by mouth daily.    [name] FLUoxetine (PROZAC) 20 MG capsule Take 60 mg by mouth daily.    [name] gabapentin (NEURONTIN) 300 MG capsule Take 300 mg by mouth 3 (three) times daily.     [name] HYDROmorphone (DILAUDID) 2 MG tablet Take 1-2 tablets (2-4 mg total) by mouth every 4 (four) hours as needed for severe pain. 30 tablet 0  . inFLIXimab (REMICADE) 100 MG injection Inject into the vein. Every 2 months    . ketoprofen (ORUDIS) 75 MG capsule Take 75 mg by mouth 3 (three) times daily.     [name] lamoTRIgine (LAMICTAL) 200 MG tablet Take 300 mg by mouth daily.     . meloxicam (MOBIC) 15 MG  tablet Take 15 mg by mouth daily.     . methocarbamol (ROBAXIN) 500 MG tablet Take 1 tablet (500 mg total) by mouth 4 (four) times daily. 60 tablet 1  . Omega-3 Fatty Acids (OMEGA 3 PO) Take 1 capsule by mouth daily.    . pantoprazole (PROTONIX) 40 MG tablet TAKE ONE TABLET TWICE DAILY 60 tablet 6  . Probiotic Product (PROBIOTIC PO) Take 1 capsule by mouth daily.    . promethazine (PHENERGAN) 12.5 MG tablet Take 1 tablet (12.5 mg total) by mouth every 6 (six) hours as needed for nausea or vomiting. 30 tablet 3  . SYNTHROID 100 MCG tablet TAKE ONE TABLET EVERY MORNING 30 tablet 5  . [DISCONTINUED] losartan-hydrochlorothiazide (HYZAAR) 50-12.5 MG per tablet Take 1 tablet by mouth daily.    . [DISCONTINUED] simvastatin (ZOCOR) 20 MG tablet Take 20 mg by mouth every evening.     No current facility-administered medications on file prior to visit.    Past Medical History  Diagnosis Date  . Anxiety   . Depression   . Thyroid disease   . Allergy   . GERD (gastroesophageal reflux disease)   . IBS (irritable bowel syndrome)   . Ulcerative colitis   . Attention deficit disorder without mention of hyperactivity   . Unspecified asthma(493.90)   . Osteoarthrosis, unspecified whether generalized or localized, unspecified site   . Other and unspecified hyperlipidemia   . Diverticulosis   . Colitis 12/05/2010  . Unspecified essential hypertension     taken off meds since lost wt  . H/O hiatal hernia     Past Surgical History  Procedure Laterality Date  . Colonoscopy    . Upper gastrointestinal endoscopy    . Back surgery      fluid removed from between disks  . Shoulder surgery      right shoulder 3x   . Total shoulder arthroplasty    . Toe surgery      right big toe  . Breast surgery      bil breast reduction   . Hip fracture surgery      left  11/01/2013  . Cholecystectomy      Laparoscopic cholecystectomy  . Anterior lumbar fusion N/A 04/22/2014    Procedure: Lumbar five/-Sacral  one. Anterior lumbar interbody fusion with Dr. Scot Dock; Left Lumbar one/two, two/three/three/four, four/five. Anterior lateral lumbar interbody fusion**Stage 1**;  Surgeon: Erline Levine, MD;  Location: [place] [facility];  Service: Neurosurgery;  Laterality: N/A;  . Anterior lateral lumbar fusion 4 levels Left 04/22/2014    Procedure: Left Lumbar one/two, two/three, three/four, four,five. Anterior lateral lumbar interbody fusion**Stage 1**;  Surgeon: Erline Levine, MD;  Location: [place] [facility];  Service: Neurosurgery;  Laterality: Left;  . Abdominal exposure N/A  04/22/2014    Procedure: ABDOMINAL EXPOSURE;  Surgeon: Angelia Mould, MD;  Location: [place] [facility];  Service: Vascular;  Laterality: N/A;  . Lumbar percutaneous pedicle screw 4 level N/A 04/25/2014    Procedure: **Stage 2** Percutaneous pedicle screw placement L1-5;  Surgeon: Erline Levine, MD;  Location: [place] [facility];  Service: Neurosurgery;  Laterality: N/A;  **Stage 2** Percutaneous pedicle screw placement L1-5    Social History   Social History  . Marital Status: Married    Spouse Name: N/A  . Number of Children: 3  . Years of Education: 16   Occupational History  . interior decorator    Social History Main Topics  . Smoking status: Former Smoker    Quit date: 10/30/1987  . Smokeless tobacco: Never Used  . Alcohol Use: 0.5 oz/week    1 drink(s) per week     Comment: once a week  . Drug Use: No  . Sexual Activity: Not Asked   Other Topics Concern  . None   Social History Narrative   [employer]; post-graduate study [employer].  married '68.  3 children: 2 daughters - '70, '72; 1 son '82; 6 grand-children. Lost her mother 2008, elderly father lives alone-in [place] died in 2022/09/19. owner/operator interior Agricultural consultant business, semi-retired. Lives with husband. Marriage is a work in progress    Review of Systems  Constitutional: Negative for fever and chills.  HENT: Positive for trouble swallowing (old - esophageal  stricture ).   Eyes: Negative for visual disturbance.  Respiratory: Positive for wheezing (with allergies). Negative for cough and shortness of breath.   Cardiovascular: Negative for chest pain and palpitations.  Gastrointestinal: Positive for abdominal pain (at baseline). Negative for nausea.  Genitourinary: Negative for dysuria, frequency and hematuria.  Neurological: Positive for dizziness (rare dizziness/off balance), tremors and numbness (left leg numbness and pain - old; no new numbness/tingling). Negative for speech difficulty, weakness (no new weakness), light-headedness and headaches.  Psychiatric/Behavioral: Positive for confusion.       Objective:   Filed Vitals:   05/08/15 1535  BP: 154/74  Pulse: 67  Temp: 98.2 F (36.8 C)  Resp: 16   Filed Weights   05/08/15 1535  Weight: 154 lb (69.854 kg)   Body mass index is 26.42 kg/(m^2).   Physical Exam  Constitutional: She is oriented to person, place, and time. She appears well-developed and well-nourished. No distress.  HENT:  Head: Normocephalic and atraumatic.  Neck: Neck supple. No tracheal deviation present. No thyromegaly present.  No carotid bruit  Cardiovascular: Normal rate, regular rhythm and normal heart sounds.   No murmur heard. Pulmonary/Chest: Effort normal and breath sounds normal. No respiratory distress. She has no wheezes. She has no rales.  Musculoskeletal: She exhibits no edema.  Lymphadenopathy:    She has no cervical adenopathy.  Neurological: She is alert and oriented to person, place, and time.  CN intact, no new weakness or sensation changes on exam  Psychiatric: She has a normal mood and affect. Her behavior is normal.        Assessment & Plan:   She comes in today with several complaints.  One concern is that she does not feel that she has high blood pressure. She had several questions regarding this. We did discuss that the blood pressure medication she is on can potentially have  some side effects including dizziness, lightheadedness or feeling of imbalance, but I do not think it is the cause of all of her symptoms  Advised her to monitor her blood pressure at home and bring in her readings Advised to follow-up with her primary care physician to discuss in detail  Change in balance, slurring of words, confusion, tremors I wonder if this is medication related. She denies any recent changes in her medication except for the blood pressure medication I asked her to discuss her symptoms with her psychiatrist knows her well We will refer to neurology for further evaluation We'll order a CT of the head Reviewed recent blood work-within normal limits so we will hold off on repeating any additional blood work now  Advised follow up in 1-2 weeks with pcp, call sooner or return sooner if needed

## 2015-05-08 NOTE — Patient Instructions (Addendum)
A CT of your head was ordered.  A referral to neurology was ordered.  Discuss your symptoms with your psychiatrist.   Keep monitoring your blood pressure at home.    Follow up in 1-2 weeks with Dr. Sharlet Salina

## 2015-05-15 ENCOUNTER — Telehealth: Payer: Self-pay | Admitting: Internal Medicine

## 2015-05-15 DIAGNOSIS — S060X0A Concussion without loss of consciousness, initial encounter: Secondary | ICD-10-CM | POA: Diagnosis not present

## 2015-05-15 DIAGNOSIS — S80812A Abrasion, left lower leg, initial encounter: Secondary | ICD-10-CM | POA: Diagnosis not present

## 2015-05-15 DIAGNOSIS — F418 Other specified anxiety disorders: Secondary | ICD-10-CM | POA: Diagnosis not present

## 2015-05-15 DIAGNOSIS — W1830XA Fall on same level, unspecified, initial encounter: Secondary | ICD-10-CM | POA: Diagnosis not present

## 2015-05-15 NOTE — Telephone Encounter (Signed)
Patient Name: [name] DOB: 02-24-1946 Initial Comment Caller states she called earlier and got appt for Wednesday, wants to know if she can get appt tomorrow. Hit head Saturday, thinks possible concussion. Nurse Assessment Nurse: Ronnald Ramp, RN, Miranda Date/Time Eilene Ghazi Time): 05/15/2015 12:26:33 PM Confirm and document reason for call. If symptomatic, describe symptoms. ---Caller states she fell on Saturday and hit her head on the ground. She states she is having slurred speech and confusion. Has the patient traveled out of the country within the last 30 days? ---Not Applicable Does the patient have any new or worsening symptoms? ---Yes Will a triage be completed? ---Yes Related visit to physician within the last 2 weeks? ---No Does the PT have any chronic conditions? (i.e. diabetes, asthma, etc.) ---Yes List chronic conditions. ---Depression, GERD, Back pain, HTN Is this a behavioral health call? ---No Guidelines Guideline Title Affirmed Question Affirmed Notes Head Injury [1] ACUTE NEURO SYMPTOM AND [2] present now (DEFINITION: difficult to awaken OR confused thinking and talking OR slurred speech OR weakness of arms OR unsteady walking) Final Disposition User Call EMS 911 Now Ronnald Ramp, RN, Miranda Comments Caller states she has an appt schedule for Wednesday but wants to know if she can come in tomorrow. Review of chart shows a recent visit for similar symptoms, but caller states the symptoms she is experiencing now are worse and different. Triaged to call 911, [place] and it will be faster for her husband to take her to the closest ED. Referrals GO TO FACILITY OTHER - SPECIFY Disagree/Comply: Disagree Disagree/Comply Reason: Disagree with instructions

## 2015-05-17 ENCOUNTER — Ambulatory Visit: Payer: Medicare Other | Admitting: Family

## 2015-05-23 ENCOUNTER — Ambulatory Visit (INDEPENDENT_AMBULATORY_CARE_PROVIDER_SITE_OTHER): Payer: Medicare Other | Admitting: Internal Medicine

## 2015-05-23 ENCOUNTER — Encounter: Payer: Self-pay | Admitting: Internal Medicine

## 2015-05-23 VITALS — BP 130/80 | HR 80 | Wt 157.0 lb

## 2015-05-23 DIAGNOSIS — I10 Essential (primary) hypertension: Secondary | ICD-10-CM

## 2015-05-23 DIAGNOSIS — S060X0A Concussion without loss of consciousness, initial encounter: Secondary | ICD-10-CM | POA: Insufficient documentation

## 2015-05-23 DIAGNOSIS — Z23 Encounter for immunization: Secondary | ICD-10-CM

## 2015-05-23 DIAGNOSIS — S060X0S Concussion without loss of consciousness, sequela: Secondary | ICD-10-CM

## 2015-05-23 NOTE — Assessment & Plan Note (Signed)
10/16 mild Discussed

## 2015-05-23 NOTE — Patient Instructions (Addendum)
Chair [employer]   Hypertension Hypertension, commonly called high blood pressure, is when the force of blood pumping through your arteries is too strong. Your arteries are the blood vessels that carry blood from your heart throughout your body. A blood pressure reading consists of a higher number over a lower number, such as 110/72. The higher number (systolic) is the pressure inside your arteries when your heart pumps. The lower number (diastolic) is the pressure inside your arteries when your heart relaxes. Ideally you want your blood pressure below 120/80. Hypertension forces your heart to work harder to pump blood. Your arteries may become narrow or stiff. Having untreated or uncontrolled hypertension can cause heart attack, stroke, kidney disease, and other problems. RISK FACTORS Some risk factors for high blood pressure are controllable. Others are not.  Risk factors you cannot control include:   Race. You may be at higher risk if you are African American.  Age. Risk increases with age.  Gender. Men are at higher risk than women before age 24 years. After age 91, women are at higher risk than men. Risk factors you can control include:  Not getting enough exercise or physical activity.  Being overweight.  Getting too much fat, sugar, calories, or salt in your diet.  Drinking too much alcohol. SIGNS AND SYMPTOMS Hypertension does not usually cause signs or symptoms. Extremely high blood pressure (hypertensive crisis) may cause headache, anxiety, shortness of breath, and nosebleed. DIAGNOSIS To check if you have hypertension, your health care provider will measure your blood pressure while you are seated, with your arm held at the level of your heart. It should be measured at least twice using the same arm. Certain conditions can cause a difference in blood pressure between your right and left arms. A blood pressure reading that is higher than normal on one occasion does not mean that you  need treatment. If it is not clear whether you have high blood pressure, you may be asked to return on a different day to have your blood pressure checked again. Or, you may be asked to monitor your blood pressure at home for 1 or more weeks. TREATMENT Treating high blood pressure includes making lifestyle changes and possibly taking medicine. Living a healthy lifestyle can help lower high blood pressure. You may need to change some of your habits. Lifestyle changes may include:  Following the DASH diet. This diet is high in fruits, vegetables, and whole grains. It is low in salt, red meat, and added sugars.  Keep your sodium intake below 2,300 mg per day.  Getting at least 30-45 minutes of aerobic exercise at least 4 times per week.  Losing weight if necessary.  Not smoking.  Limiting alcoholic beverages.  Learning ways to reduce stress. Your health care provider may prescribe medicine if lifestyle changes are not enough to get your blood pressure under control, and if one of the following is true:  You are 27-44 years of age and your systolic blood pressure is above 140.  You are 39 years of age or older, and your systolic blood pressure is above 150.  Your diastolic blood pressure is above 90.  You have diabetes, and your systolic blood pressure is over 263 or your diastolic blood pressure is over 90.  You have kidney disease and your blood pressure is above 140/90.  You have heart disease and your blood pressure is above 140/90. Your personal target blood pressure may vary depending on your medical conditions, your age, and other  factors. HOME CARE INSTRUCTIONS  Have your blood pressure rechecked as directed by your health care provider.   Take medicines only as directed by your health care provider. Follow the directions carefully. Blood pressure medicines must be taken as prescribed. The medicine does not work as well when you skip doses. Skipping doses also puts you at  risk for problems.  Do not smoke.   Monitor your blood pressure at home as directed by your health care provider. SEEK MEDICAL CARE IF:   You think you are having a reaction to medicines taken.  You have recurrent headaches or feel dizzy.  You have swelling in your ankles.  You have trouble with your vision. SEEK IMMEDIATE MEDICAL CARE IF:  You develop a severe headache or confusion.  You have unusual weakness, numbness, or feel faint.  You have severe chest or abdominal pain.  You vomit repeatedly.  You have trouble breathing. MAKE SURE YOU:   Understand these instructions.  Will watch your condition.  Will get help right away if you are not doing well or get worse.   This information is not intended to replace advice given to you by your health care provider. Make sure you discuss any questions you have with your health care provider.   Document Released: 06/10/2005 Document Revised: 10/25/2014 Document Reviewed: 04/02/2013 Elsevier Interactive Patient Education [employer].

## 2015-05-23 NOTE — Progress Notes (Signed)
Subjective:  Patient ID: [name], female    DOB: 1945-09-07  Age: 69 y.o. MRN: [id number]  CC: No chief complaint on file.   HPI [name] presents for HTN - pt went to ED in [place] for HTN (BP200/100) and a another time for a fall w/concussion - 2-3 weeks ago (CT was ok). F/u ADD.  Outpatient Prescriptions Prior to Visit  Medication Sig Dispense Refill  . albuterol (PROVENTIL HFA;VENTOLIN HFA) 108 (90 BASE) MCG/ACT inhaler Inhale 1 puff into the lungs 4 (four) times daily as needed for wheezing or shortness of breath.    . ALPRAZolam (XANAX) 0.5 MG tablet TAKE ONE TABLET AT BEDTIME AS NEEDED 30 tablet 3  . amLODipine (NORVASC) 5 MG tablet Take 1 tablet (5 mg total) by mouth daily. 30 tablet 11  . ASACOL HD 800 MG TBEC TAKE 3 TABLETS TWICE DAILY 180 each 2  . buPROPion (WELLBUTRIN XL) 300 MG 24 hr tablet Take 1 tablet (300 mg total) by mouth daily. 90 tablet 1  . calcium citrate-vitamin D (CITRACAL+D) 315-200 MG-UNIT per tablet Take 1 tablet by mouth 2 (two) times daily.    . Chlorpheniramine Maleate (ALLERGY PO) Take 1 tablet by mouth daily.    . Cholecalciferol (VITAMIN D PO) Take 1 tablet by mouth daily.    . CONCERTA 36 MG CR tablet Take 36 mg by mouth daily.     . Cyanocobalamin (VITAMIN B-12 PO) Take 2 tablets by mouth daily.    [name] FLUoxetine (PROZAC) 20 MG capsule Take 60 mg by mouth daily.    [name] gabapentin (NEURONTIN) 300 MG capsule Take 300 mg by mouth 3 (three) times daily.     [name] HYDROmorphone (DILAUDID) 2 MG tablet Take 1-2 tablets (2-4 mg total) by mouth every 4 (four) hours as needed for severe pain. 30 tablet 0  . ketoprofen (ORUDIS) 75 MG capsule Take 75 mg by mouth 3 (three) times daily.     [name] lamoTRIgine (LAMICTAL) 200 MG tablet Take 300 mg by mouth daily.     . meloxicam (MOBIC) 15 MG tablet Take 15 mg by mouth daily.     . methocarbamol (ROBAXIN) 500 MG tablet Take 1 tablet (500 mg total) by mouth 4 (four) times daily. 60 tablet 1  . Omega-3 Fatty Acids  (OMEGA 3 PO) Take 1 capsule by mouth daily.    . pantoprazole (PROTONIX) 40 MG tablet TAKE ONE TABLET TWICE DAILY 60 tablet 6  . Probiotic Product (PROBIOTIC PO) Take 1 capsule by mouth daily.    . promethazine (PHENERGAN) 12.5 MG tablet Take 1 tablet (12.5 mg total) by mouth every 6 (six) hours as needed for nausea or vomiting. 30 tablet 3  . SYNTHROID 100 MCG tablet TAKE ONE TABLET EVERY MORNING 30 tablet 5   No facility-administered medications prior to visit.    ROS Review of Systems  Constitutional: Positive for fatigue. Negative for chills, activity change, appetite change and unexpected weight change.  HENT: Negative for congestion, mouth sores and sinus pressure.   Eyes: Negative for visual disturbance.  Respiratory: Negative for cough and chest tightness.   Gastrointestinal: Negative for nausea and abdominal pain.  Genitourinary: Negative for frequency, difficulty urinating and vaginal pain.  Musculoskeletal: Positive for back pain, arthralgias and gait problem.  Skin: Negative for pallor and rash.  Neurological: Negative for dizziness, tremors, weakness, numbness and headaches.  Psychiatric/Behavioral: Negative for suicidal ideas, confusion and sleep disturbance. The patient is not nervous/anxious.  Objective:  BP 130/80 mmHg  Pulse 80  Wt 157 lb (71.215 kg)  SpO2 98%  BP Readings from Last 3 Encounters:  05/23/15 130/80  05/08/15 154/74  05/05/15 150/74    Wt Readings from Last 3 Encounters:  05/23/15 157 lb (71.215 kg)  05/08/15 154 lb (69.854 kg)  05/05/15 151 lb 12.8 oz (68.856 kg)    Physical Exam  Constitutional: She appears well-developed. No distress.  HENT:  Head: Normocephalic.  Right Ear: External ear normal.  Left Ear: External ear normal.  Nose: Nose normal.  Mouth/Throat: Oropharynx is clear and moist.  Eyes: Conjunctivae are normal. Pupils are equal, round, and reactive to light. Right eye exhibits no discharge. Left eye exhibits no  discharge.  Neck: Normal range of motion. Neck supple. No JVD present. No tracheal deviation present. No thyromegaly present.  Cardiovascular: Normal rate, regular rhythm and normal heart sounds.   Pulmonary/Chest: No stridor. No respiratory distress. She has no wheezes.  Abdominal: Soft. Bowel sounds are normal. She exhibits no distension and no mass. There is no tenderness. There is no rebound and no guarding.  Musculoskeletal: She exhibits tenderness. She exhibits no edema.  Lymphadenopathy:    She has no cervical adenopathy.  Neurological: She displays normal reflexes. No cranial nerve deficit. She exhibits normal muscle tone. Coordination abnormal.  Skin: No rash noted. No erythema.  Psychiatric: She has a normal mood and affect. Her behavior is normal. Judgment and thought content normal.  LS is tender Cane  Lab Results  Component Value Date   WBC 9.5 05/05/2015   HGB 13.0 05/05/2015   HCT 40.6 05/05/2015   PLT 327.0 05/05/2015   GLUCOSE 81 05/05/2015   CHOL 259* 03/30/2014   TRIG 177.0* 03/30/2014   HDL 47.40 03/30/2014   LDLDIRECT 182.5 02/02/2008   LDLCALC 176* 03/30/2014   ALT 12 05/05/2015   AST 11 05/05/2015   NA 141 05/05/2015   K 4.5 05/05/2015   CL 103 05/05/2015   CREATININE 0.82 05/05/2015   BUN 20 05/05/2015   CO2 30 05/05/2015   TSH 0.77 05/05/2015   INR 1.0 11/25/2008   HGBA1C 5.5 04/24/2012    Ct Lumbar Spine Wo Contrast  03/20/2015  CLINICAL DATA:  BILATERAL leg numbness and weakness with occasional spasms. Previous lumbar surgery 06/20/2014. RIGHT leg pain. EXAM: CT LUMBAR SPINE WITHOUT CONTRAST TECHNIQUE: Multidetector CT imaging of the lumbar spine was performed without intravenous contrast administration. Multiplanar CT image reconstructions were also generated. COMPARISON:  Lumbar plain films most recent 02/20/2015. FINDINGS: The patient has undergone L1 through L5 XLIF and L5-S1 ALIF. There is no solid interbody bridging at any level except for  L2-L3 on the LEFT. Unilateral LEFT screw placement at L2, otherwise BILATERAL pedicle screw placement from L1 through S1. BILATERAL screw loosening at S1. Unilateral RIGHT L1 screw loosening. Mild stenosis L4-L5 and L3-L4 related to posterior element hypertrophy. No paravertebral mass or signs of infection. BILATERAL foraminal narrowing is greatest at L5-S1, worse on the RIGHT although moderate foraminal narrowing can be seen at L3-4 and L4-5 on the RIGHT. Trace anterolisthesis L4-5. Scoliotic deformity convex RIGHT centered at L2 approximately 14 degrees. No paravertebral mass. 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 Beginning arthrodesis is seen across the L2-3 interspace on the LEFT. See discussion above. With regard to RIGHT leg pain, foraminal narrowing at L5-S1 on the RIGHT could be symptomatic. Correlate clinically for RIGHT L5 radiculopathy.  Electronically Signed   By: Staci Righter M.D.   On: 03/20/2015 16:36    Assessment & Plan:   There are no diagnoses linked to this encounter. I am having Ms. Daubenspeck maintain her lamoTRIgine, FLUoxetine, albuterol, Omega-3 Fatty Acids (OMEGA 3 PO), Probiotic Product (PROBIOTIC PO), Cholecalciferol (VITAMIN D PO), promethazine, Cyanocobalamin (VITAMIN B-12 PO), calcium citrate-vitamin D, Chlorpheniramine Maleate (ALLERGY PO), ASACOL HD, methocarbamol, HYDROmorphone, buPROPion, ketoprofen, CONCERTA, pantoprazole, ALPRAZolam, SYNTHROID, gabapentin, meloxicam, and amLODipine.  No orders of the defined types were placed in this encounter.     Follow-up: No Follow-up on file.  Walker Kehr, MD

## 2015-05-23 NOTE — Progress Notes (Signed)
Pre visit review using our clinic review tool, if applicable. No additional management support is needed unless otherwise documented below in the visit note. 

## 2015-05-23 NOTE — Assessment & Plan Note (Addendum)
11/16 amlodipine Doing well now Discussed Concerta dose reduction to help BP

## 2015-05-24 ENCOUNTER — Encounter: Payer: Self-pay | Admitting: Diagnostic Neuroimaging

## 2015-05-24 ENCOUNTER — Ambulatory Visit (INDEPENDENT_AMBULATORY_CARE_PROVIDER_SITE_OTHER): Payer: Medicare Other | Admitting: Diagnostic Neuroimaging

## 2015-05-24 VITALS — BP 138/75 | HR 70 | Ht 65.0 in | Wt 156.6 lb

## 2015-05-24 DIAGNOSIS — M4806 Spinal stenosis, lumbar region: Secondary | ICD-10-CM | POA: Diagnosis not present

## 2015-05-24 DIAGNOSIS — G609 Hereditary and idiopathic neuropathy, unspecified: Secondary | ICD-10-CM

## 2015-05-24 DIAGNOSIS — R413 Other amnesia: Secondary | ICD-10-CM

## 2015-05-24 DIAGNOSIS — M48061 Spinal stenosis, lumbar region without neurogenic claudication: Secondary | ICD-10-CM

## 2015-05-24 NOTE — Patient Instructions (Addendum)
Thank you for coming to see [place] at [facility]. I hope we have been able to provide you high quality care today.  You may receive a patient satisfaction survey over the next few weeks. We would appreciate your feedback and comments so that we may continue to improve ourselves and the health of our patients.  - I will check MRI brain - use walker and cane - return with a family member for next visit   ~~~~~~~~~~~~~~~~~~~~~~~~~~~~~~~~~~~~~~~~~~~~~~~~~~~~~~~~~~~~~~~~~  DR. PENUMALLI'S GUIDE TO HAPPY AND HEALTHY LIVING These are some of my general health and wellness recommendations. Some of them may apply to you better than others. Please use common sense as you try these suggestions and feel free to ask me any questions.   ACTIVITY/FITNESS Mental, social, emotional and physical stimulation are very important for brain and body health. Try learning a new activity (arts, music, language, sports, games).  Keep moving your body to the best of your abilities. You can do this at home, inside or outside, the park, community center, gym or anywhere you like. Consider a physical therapist or personal trainer to get started. Consider the app Sworkit. Fitness trackers such as smart-watches, smart-phones or Fitbits can help as well.   NUTRITION Eat more plants: colorful vegetables, nuts, seeds and berries.  Eat less sugar, salt, preservatives and processed foods.  Avoid toxins such as cigarettes and alcohol.  Drink water when you are thirsty. Warm water with a slice of lemon is an excellent morning drink to start the day.  Consider these websites for more information The Nutrition Source (https://www.henry-hernandez.biz/) Precision Nutrition (WindowBlog.ch)   RELAXATION Consider practicing mindfulness meditation or other relaxation techniques such as deep breathing, prayer, yoga, tai chi, massage. See website mindful.org or the apps  Headspace or Calm to help get started.   SLEEP Try to get at least 7-8+ hours sleep per day. Regular exercise and reduced caffeine will help you sleep better. Practice good sleep hygeine techniques. See website sleep.org for more information.   PLANNING Prepare estate planning, living will, healthcare POA documents. Sometimes this is best planned with the help of an attorney. Theconversationproject.org and agingwithdignity.org are excellent resources.

## 2015-05-24 NOTE — Progress Notes (Signed)
[facility]  PATIENT: [name] DOB: February 16, 1946  REFERRING CLINICIAN: Burns HISTORY FROM: patient  REASON FOR VISIT: new consult    HISTORICAL  CHIEF COMPLAINT:  Chief Complaint  Patient presents with  . Subacute confusional state    rm 7, New Patient, "balance issues"    HISTORY OF PRESENT ILLNESS:   69 year old right-handed female here for evaluation of slurred speech, confusion, balance issues. Patient had episode of hypertension 200/100 in early November 2016, went to emergency room in [place], and was treated with high blood pressure medication. She followed up with primary care clinic and apparently she was having side effects and therefore adjustments were made. At some point patient had episode of falling backwards, hitting her head, with resultant slurred speech and double vision. Patient went to emergency room for evaluation and was discharged home. She then followed up with her PCP clinic again and apparently her daughter came for a visit with her. Apparently her daughter had noted increasing slurred speech, tremor, balance, slurring speech, confusion and forgetfulness. Unfortunately patient is alone for this visit today and I do not have any information from patient's daughter to confirm. Obtain information by reviewing chart notes extensively and summarizing as above.    REVIEW OF SYSTEMS: Full 14 system review of systems performed and notable only for weight gain fatigue joint pain joint swelling allergies depression anxiety decreased energy change in appetite difficulty swallowing memory loss.  ALLERGIES: Allergies  Allergen Reactions  . Ampicillin Nausea And Vomiting  . Erythromycin Nausea And Vomiting  . Hctz [Hydrochlorothiazide]     dizzy  . Metoprolol     doublevision   . Morphine And Related Nausea And Vomiting    HOME MEDICATIONS: Outpatient Prescriptions Prior to Visit  Medication Sig Dispense Refill  . albuterol (PROVENTIL  HFA;VENTOLIN HFA) 108 (90 BASE) MCG/ACT inhaler Inhale 1 puff into the lungs 4 (four) times daily as needed for wheezing or shortness of breath.    . ALPRAZolam (XANAX) 0.5 MG tablet TAKE ONE TABLET AT BEDTIME AS NEEDED 30 tablet 3  . amLODipine (NORVASC) 5 MG tablet Take 1 tablet (5 mg total) by mouth daily. 30 tablet 11  . ASACOL HD 800 MG TBEC TAKE 3 TABLETS TWICE DAILY 180 each 2  . buPROPion (WELLBUTRIN XL) 300 MG 24 hr tablet Take 1 tablet (300 mg total) by mouth daily. 90 tablet 1  . calcium citrate-vitamin D (CITRACAL+D) 315-200 MG-UNIT per tablet Take 1 tablet by mouth 2 (two) times daily.    . Chlorpheniramine Maleate (ALLERGY PO) Take 1 tablet by mouth daily.    . Cholecalciferol (VITAMIN D PO) Take 1 tablet by mouth daily.    . CONCERTA 36 MG CR tablet Take 36 mg by mouth daily.     . Cyanocobalamin (VITAMIN B-12 PO) Take 2 tablets by mouth daily.    [name] FLUoxetine (PROZAC) 20 MG capsule Take 60 mg by mouth daily.    [name] lamoTRIgine (LAMICTAL) 200 MG tablet Take 300 mg by mouth daily.     . meloxicam (MOBIC) 15 MG tablet Take 15 mg by mouth daily.     . methocarbamol (ROBAXIN) 500 MG tablet Take 1 tablet (500 mg total) by mouth 4 (four) times daily. 60 tablet 1  . Omega-3 Fatty Acids (OMEGA 3 PO) Take 1 capsule by mouth daily.    . pantoprazole (PROTONIX) 40 MG tablet TAKE ONE TABLET TWICE DAILY 60 tablet 6  . Probiotic Product (PROBIOTIC PO) Take 1 capsule by  mouth daily.    . promethazine (PHENERGAN) 12.5 MG tablet Take 1 tablet (12.5 mg total) by mouth every 6 (six) hours as needed for nausea or vomiting. 30 tablet 3  . SYNTHROID 100 MCG tablet TAKE ONE TABLET EVERY MORNING 30 tablet 5  . gabapentin (NEURONTIN) 300 MG capsule Take 300 mg by mouth 3 (three) times daily.      No facility-administered medications prior to visit.    PAST MEDICAL HISTORY: Past Medical History  Diagnosis Date  . Anxiety   . Depression   . Thyroid disease   . Allergy   . GERD (gastroesophageal  reflux disease)   . IBS (irritable bowel syndrome)   . Ulcerative colitis   . Attention deficit disorder without mention of hyperactivity   . Unspecified asthma(493.90)   . Osteoarthrosis, unspecified whether generalized or localized, unspecified site   . Other and unspecified hyperlipidemia   . Diverticulosis   . Colitis 12/05/2010  . Unspecified essential hypertension     taken off meds since lost wt  . H/O hiatal hernia     PAST SURGICAL HISTORY: Past Surgical History  Procedure Laterality Date  . Colonoscopy    . Upper gastrointestinal endoscopy    . Back surgery      fluid removed from between disks  . Shoulder surgery      right shoulder 3x   . Total shoulder arthroplasty    . Toe surgery      right big toe  . Breast surgery      bil breast reduction   . Hip fracture surgery      left  11/01/2013  . Cholecystectomy      Laparoscopic cholecystectomy  . Anterior lumbar fusion N/A 04/22/2014    Procedure: Lumbar five/-Sacral one. Anterior lumbar interbody fusion with Dr. Scot Dock; Left Lumbar one/two, two/three/three/four, four/five. Anterior lateral lumbar interbody fusion**Stage 1**;  Surgeon: Erline Levine, MD;  Location: [facility];  Service: Neurosurgery;  Laterality: N/A;  . Anterior lateral lumbar fusion 4 levels Left 04/22/2014    Procedure: Left Lumbar one/two, two/three, three/four, four,five. Anterior lateral lumbar interbody fusion**Stage 1**;  Surgeon: Erline Levine, MD;  Location: [facility];  Service: Neurosurgery;  Laterality: Left;  . Abdominal exposure N/A 04/22/2014    Procedure: ABDOMINAL EXPOSURE;  Surgeon: Angelia Mould, MD;  Location: [facility];  Service: Vascular;  Laterality: N/A;  . Lumbar percutaneous pedicle screw 4 level N/A 04/25/2014    Procedure: **Stage 2** Percutaneous pedicle screw placement L1-5;  Surgeon: Erline Levine, MD;  Location: [facility];  Service: Neurosurgery;  Laterality: N/A;  **Stage 2** Percutaneous pedicle screw  placement L1-5    FAMILY HISTORY: Family History  Problem Relation Age of Onset  . Hypertension Father   . Colon cancer Father 15  . Arthritis Father   . Heart disease Father     CHF  . Diabetes Neg Hx   . Heart disease Mother     Died, 35  . Healthy Brother   . Healthy Daughter     SOCIAL HISTORY:  Social History   Social History  . Marital Status: Married    Spouse Name: N/A  . Number of Children: 3  . Years of Education: 16   Occupational History  . interior decorator     retired   Social History Main Topics  . Smoking status: Former Smoker    Quit date: 10/30/1978  . Smokeless tobacco: Never Used  . Alcohol Use: 0.5 oz/week  1 Standard drinks or equivalent per week     Comment: once a week  . Drug Use: No  . Sexual Activity: Not on file   Other Topics Concern  . Not on file   Social History Narrative   [employer]; post-graduate study [employer].  married '68.  3 children: 2 daughters - '70, '72; 1 son '82; 6 grand-children. Lost her mother 2008, elderly father lives alone-in [place] died in 09/14/2022. owner/operator interior Agricultural consultant business, semi-retired. Lives with husband. Marriage is a work in progress      Caffeine - coffee 1 cup daily     PHYSICAL EXAM  GENERAL EXAM/CONSTITUTIONAL: Vitals:  Filed Vitals:   05/24/15 0900  BP: 138/75  Pulse: 70  Height: 5' 5"  (1.651 m)  Weight: 156 lb 9.6 oz (71.033 kg)     Body mass index is 26.06 kg/(m^2).  Visual Acuity Screening   Right eye Left eye Both eyes  Without correction:     With correction: 20/20 20/30      Patient is in no distress; well developed, nourished and groomed; neck is supple  CARDIOVASCULAR:  Examination of carotid arteries is normal; no carotid bruits  Regular rate and rhythm, no murmurs  Examination of peripheral vascular system by observation and palpation is normal  EYES:  Ophthalmoscopic exam of optic discs and posterior segments is normal; no papilledema  or hemorrhages  MUSCULOSKELETAL:  Gait, strength, tone, movements noted in Neurologic exam below  NEUROLOGIC: MENTAL STATUS:  MMSE - Mini Mental State Exam 05/24/2015  Orientation to time 4  Orientation to Place 5  Registration 3  Attention/ Calculation 2  Recall 1  Language- name 2 objects 2  Language- repeat 1  Language- follow 3 step command 3  Language- read & follow direction 1  Write a sentence 1  Copy design 0  Total score 23    awake, alert, oriented to person, place, time  DECR MEMORY   DECR ATTENTION AND CONCENTRATION  language fluent, comprehension intact, naming intact,   fund of knowledge appropriate  CRANIAL NERVE:   2nd - no papilledema on fundoscopic exam  2nd, 3rd, 4th, 6th - pupils equal and reactive to light, visual fields full to confrontation, extraocular muscles intact, no nystagmus  5th - facial sensation symmetric  7th - facial strength symmetric  8th - hearing intact  9th - palate elevates symmetrically, uvula midline  11th - shoulder shrug symmetric  12th - tongue protrusion midline  MOTOR:   normal bulk and tone, full strength in the BUE, BLE; MILD POSTURAL TREMOR IN BUE  SENSORY:   normal and symmetric to light touch, temperature; DECR VIBRATION  COORDINATION:   finger-nose-finger, fine finger movements, heel-shin normal  REFLEXES:   deep tendon reflexes --> BUE 2, KNEES 1, ANKLES 0  GAIT/STATION:   ANTALAGIC GAIT, VERY UNSTEADY, USING A CANE    DIAGNOSTIC DATA (LABS, IMAGING, TESTING) - I reviewed patient records, labs, notes, testing and imaging myself where available.  Lab Results  Component Value Date   WBC 9.5 05/05/2015   HGB 13.0 05/05/2015   HCT 40.6 05/05/2015   MCV 83.6 05/05/2015   PLT 327.0 05/05/2015      Component Value Date/Time   NA 141 05/05/2015 1057   K 4.5 05/05/2015 1057   CL 103 05/05/2015 1057   CO2 30 05/05/2015 1057   GLUCOSE 81 05/05/2015 1057   BUN 20 05/05/2015 1057    CREATININE 0.82 05/05/2015 1057   CALCIUM 9.8  05/05/2015 1057   PROT 6.9 05/05/2015 1057   ALBUMIN 4.2 05/05/2015 1057   AST 11 05/05/2015 1057   ALT 12 05/05/2015 1057   ALKPHOS 81 05/05/2015 1057   BILITOT 0.3 05/05/2015 1057   GFRNONAA >90 04/24/2014 1045   GFRAA >90 04/24/2014 1045   Lab Results  Component Value Date   CHOL 259* 03/30/2014   HDL 47.40 03/30/2014   LDLCALC 176* 03/30/2014   LDLDIRECT 182.5 02/02/2008   TRIG 177.0* 03/30/2014   CHOLHDL 5 03/30/2014   Lab Results  Component Value Date   HGBA1C 5.5 04/24/2012   Lab Results  Component Value Date   VITAMINB12 >1500* 12/22/2014   Lab Results  Component Value Date   TSH 0.77 05/05/2015    03/20/15 CT lumbar spine (without)  - Status post L1 through L5 XLIF and L5-S1 ALIF. Beginning arthrodesis is seen across the L2-3 interspace on the LEFT. See discussion above.  - With regard to RIGHT leg pain, foraminal narrowing at L5-S1 on the RIGHT could be symptomatic. Correlate clinically for RIGHT L5 radiculopathy.      ASSESSMENT AND PLAN  69 y.o. year old female here alone for this visit, with suspected subacute to chronic memory loss and confusion, with more recent problems in the setting of uncontrolled hypertension, fall, concussion, and probable postconcussion syndrome. Patient also with history of lumbar spinal stenosis and peripheral neuropathy. Will check additional testing with MRI of the brain. Advised patient to return with her daughter her spouse to provide collateral history at next visit. MMSE 23 out of 30. Suspect patient may have onset of mild neurodegenerative dementia, but will need digital history and information to confirm.   Dx:  Memory loss - Plan: MR Brain Wo Contrast  Spinal stenosis, lumbar  Hereditary and idiopathic peripheral neuropathy    PLAN:  Orders Placed This Encounter  Procedures  . MR Brain Wo Contrast   Return in about 1 month (around 06/23/2015) for return with  spouse or daughter.  I reviewed images, labs, notes, records myself. I summarized findings and reviewed with patient, for this high risk condition (post-concussion syndrome, memory loss, balance difficulty, recurrent falls) requiring high complexity decision making.     Penni Bombard, MD 00/45/9977, 4:14 AM Certified in Neurology, Neurophysiology and Neuroimaging  [facility] [address], [place], [place] [address] [phone]

## 2015-05-29 ENCOUNTER — Telehealth: Payer: Self-pay | Admitting: *Deleted

## 2015-05-29 NOTE — Telephone Encounter (Addendum)
Spoke with husband and requested his wife call this RN back today. Need clarification of fax sent last week. Gave him office number, this RN's name.  Received call back from patient who states she went to [facility]. Will refax medical release to that facility.

## 2015-05-30 ENCOUNTER — Encounter: Payer: Self-pay | Admitting: Internal Medicine

## 2015-05-31 DIAGNOSIS — F39 Unspecified mood [affective] disorder: Secondary | ICD-10-CM | POA: Diagnosis not present

## 2015-06-01 ENCOUNTER — Other Ambulatory Visit: Payer: Self-pay | Admitting: Internal Medicine

## 2015-06-01 DIAGNOSIS — K529 Noninfective gastroenteritis and colitis, unspecified: Secondary | ICD-10-CM | POA: Diagnosis not present

## 2015-06-01 MED ORDER — VALSARTAN 160 MG PO TABS: 160.0000 mg | ORAL_TABLET | Freq: Every day | ORAL | Status: DC

## 2015-06-06 ENCOUNTER — Ambulatory Visit: Payer: Medicare Other | Admitting: Internal Medicine

## 2015-06-20 ENCOUNTER — Ambulatory Visit: Payer: Medicare Other

## 2015-06-20 ENCOUNTER — Ambulatory Visit: Payer: Medicare Other | Admitting: Internal Medicine

## 2015-06-20 VITALS — BP 118/64

## 2015-06-20 DIAGNOSIS — I10 Essential (primary) hypertension: Secondary | ICD-10-CM

## 2015-06-22 ENCOUNTER — Ambulatory Visit: Payer: Medicare Other | Admitting: Diagnostic Neuroimaging

## 2015-06-29 ENCOUNTER — Other Ambulatory Visit: Payer: Medicare Other

## 2015-06-29 DIAGNOSIS — F39 Unspecified mood [affective] disorder: Secondary | ICD-10-CM | POA: Diagnosis not present

## 2015-06-30 ENCOUNTER — Encounter: Payer: Self-pay | Admitting: Internal Medicine

## 2015-06-30 ENCOUNTER — Ambulatory Visit (INDEPENDENT_AMBULATORY_CARE_PROVIDER_SITE_OTHER): Payer: Medicare Other | Admitting: Internal Medicine

## 2015-06-30 VITALS — BP 118/80 | HR 81 | Wt 155.0 lb

## 2015-06-30 DIAGNOSIS — M5431 Sciatica, right side: Secondary | ICD-10-CM | POA: Diagnosis not present

## 2015-06-30 DIAGNOSIS — G8929 Other chronic pain: Secondary | ICD-10-CM | POA: Diagnosis not present

## 2015-06-30 DIAGNOSIS — M25551 Pain in right hip: Secondary | ICD-10-CM | POA: Diagnosis not present

## 2015-06-30 DIAGNOSIS — M539 Dorsopathy, unspecified: Secondary | ICD-10-CM

## 2015-06-30 DIAGNOSIS — I10 Essential (primary) hypertension: Secondary | ICD-10-CM | POA: Diagnosis not present

## 2015-06-30 DIAGNOSIS — M489 Spondylopathy, unspecified: Secondary | ICD-10-CM

## 2015-06-30 MED ORDER — AMLODIPINE BESYLATE-VALSARTAN 5-160 MG PO TABS: 1.0000 | ORAL_TABLET | Freq: Every day | ORAL | Status: DC

## 2015-06-30 MED ORDER — HYDROMORPHONE HCL 4 MG PO TABS: 2.0000 mg | ORAL_TABLET | Freq: Two times a day (BID) | ORAL | Status: DC | PRN

## 2015-06-30 MED ORDER — METHOCARBAMOL 500 MG PO TABS: 500.0000 mg | ORAL_TABLET | Freq: Four times a day (QID) | ORAL | Status: DC

## 2015-06-30 NOTE — Progress Notes (Signed)
Pre visit review using our clinic review tool, if applicable. No additional management support is needed unless otherwise documented below in the visit note. 

## 2015-06-30 NOTE — Patient Instructions (Addendum)
Spiky ball Foam roller IT band stretching

## 2015-06-30 NOTE — Progress Notes (Signed)
Subjective:  Patient ID: [name], female    DOB: 1946/01/29  Age: 70 y.o. MRN: [id number]  CC: No chief complaint on file.   HPI [name] presents for sciatic pain on R side since Christmas. F/u HTN [place] [facility] PT - Angela  Outpatient Prescriptions Prior to Visit  Medication Sig Dispense Refill  . albuterol (PROVENTIL HFA;VENTOLIN HFA) 108 (90 BASE) MCG/ACT inhaler Inhale 1 puff into the lungs 4 (four) times daily as needed for wheezing or shortness of breath.    . ALPRAZolam (XANAX) 0.5 MG tablet TAKE ONE TABLET AT BEDTIME AS NEEDED 30 tablet 3  . ASACOL HD 800 MG TBEC TAKE 3 TABLETS TWICE DAILY 180 each 2  . buPROPion (WELLBUTRIN XL) 300 MG 24 hr tablet Take 1 tablet (300 mg total) by mouth daily. 90 tablet 1  . calcium citrate-vitamin D (CITRACAL+D) 315-200 MG-UNIT per tablet Take 1 tablet by mouth 2 (two) times daily.    . Chlorpheniramine Maleate (ALLERGY PO) Take 1 tablet by mouth daily.    . Cholecalciferol (VITAMIN D PO) Take 1 tablet by mouth daily.    . CONCERTA 36 MG CR tablet Take 36 mg by mouth daily.     . Cyanocobalamin (VITAMIN B-12 PO) Take 2 tablets by mouth daily.    [name] FLUoxetine (PROZAC) 20 MG capsule Take 60 mg by mouth daily.    . meloxicam (MOBIC) 15 MG tablet Take 15 mg by mouth daily.     . Omega-3 Fatty Acids (OMEGA 3 PO) Take 1 capsule by mouth daily.    . pantoprazole (PROTONIX) 40 MG tablet TAKE ONE TABLET TWICE DAILY 60 tablet 6  . Probiotic Product (PROBIOTIC PO) Take 1 capsule by mouth daily.    . promethazine (PHENERGAN) 12.5 MG tablet Take 1 tablet (12.5 mg total) by mouth every 6 (six) hours as needed for nausea or vomiting. 30 tablet 3  . SYNTHROID 100 MCG tablet TAKE ONE TABLET EVERY MORNING 30 tablet 5  . amLODipine (NORVASC) 5 MG tablet Take 1 tablet (5 mg total) by mouth daily. 30 tablet 11  . lamoTRIgine (LAMICTAL) 200 MG tablet Take 300 mg by mouth daily.     . methocarbamol (ROBAXIN) 500 MG tablet Take 1 tablet (500 mg total)  by mouth 4 (four) times daily. 60 tablet 1  . valsartan (DIOVAN) 160 MG tablet Take 1 tablet (160 mg total) by mouth daily. 30 tablet 11   No facility-administered medications prior to visit.    ROS Review of Systems  Constitutional: Negative for chills, activity change, appetite change, fatigue and unexpected weight change.  HENT: Negative for congestion, mouth sores and sinus pressure.   Eyes: Negative for visual disturbance.  Respiratory: Negative for cough and chest tightness.   Gastrointestinal: Negative for nausea and abdominal pain.  Genitourinary: Negative for frequency, difficulty urinating and vaginal pain.  Musculoskeletal: Positive for back pain, arthralgias and gait problem.  Skin: Negative for pallor and rash.  Neurological: Negative for dizziness, tremors, weakness, numbness and headaches.  Psychiatric/Behavioral: Negative for confusion and sleep disturbance.  Str leg elev is (-) B; limping Cane R IT band is tender     Procedure Note :     Procedure : Joint Injection,  R  hip   Indication:  Trochanteric bursitis with refractory  chronic pain.   Risks including unsuccessful procedure , bleeding, infection, bruising, skin atrophy, "steroid flare-up" and others were explained to the patient in detail as well as the benefits. Informed  consent was obtained and signed.   Tthe patient was placed in a comfortable lateral decubitus position. The point of maximal tenderness was identified. Skin was prepped with Betadine and alcohol. Then, a 5 cc syringe with a 2 inch long 24-gauge needle was used for a bursa injection.. The needle was advanced  Into the bursa. I injected the bursa with 4 mL of 2% lidocaine and 40 mg of Depo-Medrol .  Band-Aid was applied.   Tolerated well. Complications: None. Good pain relief following the procedure.   Postprocedure instructions :    A Band-Aid should be left on for 12 hours. Injection therapy is not a cure itself. It is used in  conjunction with other modalities. You can use nonsteroidal anti-inflammatories like ibuprofen , hot and cold compresses. Rest is recommended in the next 24 hours. You need to report immediately  if fever, chills or any signs of infection develop.   Objective:  BP 118/80 mmHg  Pulse 81  Wt 155 lb (70.308 kg)  SpO2 97%  BP Readings from Last 3 Encounters:  06/30/15 118/80  06/20/15 118/64  05/24/15 138/75    Wt Readings from Last 3 Encounters:  06/30/15 155 lb (70.308 kg)  05/24/15 156 lb 9.6 oz (71.033 kg)  05/23/15 157 lb (71.215 kg)    Physical Exam  Constitutional: She appears well-developed. No distress.  HENT:  Head: Normocephalic.  Right Ear: External ear normal.  Left Ear: External ear normal.  Nose: Nose normal.  Mouth/Throat: Oropharynx is clear and moist.  Eyes: Conjunctivae are normal. Pupils are equal, round, and reactive to light. Right eye exhibits no discharge. Left eye exhibits no discharge.  Neck: Normal range of motion. Neck supple. No JVD present. No tracheal deviation present. No thyromegaly present.  Cardiovascular: Normal rate, regular rhythm and normal heart sounds.   Pulmonary/Chest: No stridor. No respiratory distress. She has no wheezes.  Abdominal: Soft. Bowel sounds are normal. She exhibits no distension and no mass. There is no tenderness. There is no rebound and no guarding.  Musculoskeletal: She exhibits tenderness. She exhibits no edema.  Lymphadenopathy:    She has no cervical adenopathy.  Neurological: She displays normal reflexes. No cranial nerve deficit. She exhibits normal muscle tone. Coordination abnormal.  Skin: No rash noted. No erythema.  Psychiatric: She has a normal mood and affect. Her behavior is normal. Judgment and thought content normal.    Lab Results  Component Value Date   WBC 9.5 05/05/2015   HGB 13.0 05/05/2015   HCT 40.6 05/05/2015   PLT 327.0 05/05/2015   GLUCOSE 81 05/05/2015   CHOL 259* 03/30/2014   TRIG  177.0* 03/30/2014   HDL 47.40 03/30/2014   LDLDIRECT 182.5 02/02/2008   LDLCALC 176* 03/30/2014   ALT 12 05/05/2015   AST 11 05/05/2015   NA 141 05/05/2015   K 4.5 05/05/2015   CL 103 05/05/2015   CREATININE 0.82 05/05/2015   BUN 20 05/05/2015   CO2 30 05/05/2015   TSH 0.77 05/05/2015   INR 1.0 11/25/2008   HGBA1C 5.5 04/24/2012    Ct Lumbar Spine Wo Contrast  03/20/2015  CLINICAL DATA:  BILATERAL leg numbness and weakness with occasional spasms. Previous lumbar surgery 06/20/2014. RIGHT leg pain. EXAM: CT LUMBAR SPINE WITHOUT CONTRAST TECHNIQUE: Multidetector CT imaging of the lumbar spine was performed without intravenous contrast administration. Multiplanar CT image reconstructions were also generated. COMPARISON:  Lumbar plain films most recent 02/20/2015. FINDINGS: The patient has undergone L1 through L5 XLIF and L5-S1  ALIF. There is no solid interbody bridging at any level except for L2-L3 on the LEFT. Unilateral LEFT screw placement at L2, otherwise BILATERAL pedicle screw placement from L1 through S1. BILATERAL screw loosening at S1. Unilateral RIGHT L1 screw loosening. Mild stenosis L4-L5 and L3-L4 related to posterior element hypertrophy. No paravertebral mass or signs of infection. BILATERAL foraminal narrowing is greatest at L5-S1, worse on the RIGHT although moderate foraminal narrowing can be seen at L3-4 and L4-5 on the RIGHT. Trace anterolisthesis L4-5. Scoliotic deformity convex RIGHT centered at L2 approximately 14 degrees. No paravertebral mass. 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 Beginning arthrodesis is seen across the L2-3 interspace on the LEFT. See discussion above. With regard to RIGHT leg pain, foraminal narrowing at L5-S1 on the RIGHT could be symptomatic. Correlate clinically for RIGHT L5 radiculopathy. Electronically Signed   By: Staci Righter M.D.   On:  03/20/2015 16:36    Assessment & Plan:   Diagnoses and all orders for this visit:  Sciatica of right side  Other orders -     amLODipine-valsartan (EXFORGE) 5-160 MG tablet; Take 1 tablet by mouth daily. -     HYDROmorphone (DILAUDID) 4 MG tablet; Take 0.5-1 tablets (2-4 mg total) by mouth 2 (two) times daily as needed for severe pain. -     methocarbamol (ROBAXIN) 500 MG tablet; Take 1 tablet (500 mg total) by mouth 4 (four) times daily. -     methylPREDNISolone acetate (DEPO-MEDROL) injection 80 mg; Inject 1 mL (80 mg total) into the muscle once.   I have discontinued Ms. Falkenstein's amLODipine and valsartan. I am also having her start on amLODipine-valsartan and HYDROmorphone. Additionally, I am having her maintain her FLUoxetine, albuterol, Omega-3 Fatty Acids (OMEGA 3 PO), Probiotic Product (PROBIOTIC PO), Cholecalciferol (VITAMIN D PO), promethazine, Cyanocobalamin (VITAMIN B-12 PO), calcium citrate-vitamin D, Chlorpheniramine Maleate (ALLERGY PO), ASACOL HD, buPROPion, CONCERTA, pantoprazole, ALPRAZolam, SYNTHROID, meloxicam, lamoTRIgine, and methocarbamol.  Meds ordered this encounter  Medications  . lamoTRIgine (LAMICTAL) 100 MG tablet    Sig: Take 1 tablet by mouth 3 (three) times daily.  [name] amLODipine-valsartan (EXFORGE) 5-160 MG tablet    Sig: Take 1 tablet by mouth daily.    Dispense:  30 tablet    Refill:  11  . HYDROmorphone (DILAUDID) 4 MG tablet    Sig: Take 0.5-1 tablets (2-4 mg total) by mouth 2 (two) times daily as needed for severe pain.    Dispense:  60 tablet    Refill:  0  . methocarbamol (ROBAXIN) 500 MG tablet    Sig: Take 1 tablet (500 mg total) by mouth 4 (four) times daily.    Dispense:  60 tablet    Refill:  1     Follow-up: No Follow-up on file.  Walker Kehr, MD

## 2015-07-02 DIAGNOSIS — G8929 Other chronic pain: Secondary | ICD-10-CM | POA: Insufficient documentation

## 2015-07-02 DIAGNOSIS — M25551 Pain in right hip: Secondary | ICD-10-CM

## 2015-07-02 MED ORDER — METHYLPREDNISOLONE ACETATE 80 MG/ML IJ SUSP: 80.0000 mg | Freq: Once | INTRAMUSCULAR | Status: DC

## 2015-07-02 MED ORDER — METHYLPREDNISOLONE ACETATE 80 MG/ML IJ SUSP
80.0000 mg | Freq: Once | INTRAMUSCULAR | Status: AC
  Administered 2015-07-04: 80 mg via INTRA_ARTICULAR

## 2015-07-02 NOTE — Assessment & Plan Note (Signed)
1/17 IT band strain, bursitis Will inject bursa Heel lift Start PT IT band stretch

## 2015-07-02 NOTE — Assessment & Plan Note (Signed)
On Amlodipine

## 2015-07-02 NOTE — Assessment & Plan Note (Signed)
Re-start PT

## 2015-07-04 DIAGNOSIS — G8929 Other chronic pain: Secondary | ICD-10-CM | POA: Diagnosis not present

## 2015-07-04 DIAGNOSIS — F39 Unspecified mood [affective] disorder: Secondary | ICD-10-CM | POA: Diagnosis not present

## 2015-07-04 DIAGNOSIS — F900 Attention-deficit hyperactivity disorder, predominantly inattentive type: Secondary | ICD-10-CM | POA: Diagnosis not present

## 2015-07-04 DIAGNOSIS — M25551 Pain in right hip: Secondary | ICD-10-CM | POA: Diagnosis not present

## 2015-07-07 ENCOUNTER — Inpatient Hospital Stay: Admission: RE | Admit: 2015-07-07 | Payer: Medicare Other | Source: Ambulatory Visit

## 2015-07-11 ENCOUNTER — Ambulatory Visit: Payer: Medicare Other | Admitting: Diagnostic Neuroimaging

## 2015-07-26 ENCOUNTER — Ambulatory Visit: Payer: Medicare Other | Admitting: Internal Medicine

## 2015-07-31 ENCOUNTER — Encounter: Payer: Self-pay | Admitting: Internal Medicine

## 2015-07-31 ENCOUNTER — Ambulatory Visit (INDEPENDENT_AMBULATORY_CARE_PROVIDER_SITE_OTHER): Payer: Medicare Other | Admitting: Internal Medicine

## 2015-07-31 VITALS — BP 140/74 | HR 81 | Temp 98.5°F | Wt 153.0 lb

## 2015-07-31 DIAGNOSIS — I10 Essential (primary) hypertension: Secondary | ICD-10-CM

## 2015-07-31 DIAGNOSIS — R110 Nausea: Secondary | ICD-10-CM | POA: Insufficient documentation

## 2015-07-31 MED ORDER — AMLODIPINE BESYLATE-VALSARTAN 5-160 MG PO TABS: 1.0000 | ORAL_TABLET | Freq: Every day | ORAL | Status: DC

## 2015-07-31 MED ORDER — ONDANSETRON HCL 4 MG PO TABS: 4.0000 mg | ORAL_TABLET | Freq: Three times a day (TID) | ORAL | Status: DC | PRN

## 2015-07-31 NOTE — Assessment & Plan Note (Signed)
2/17 occasional - ?migraines Zofran prn

## 2015-07-31 NOTE — Progress Notes (Signed)
Subjective:  Patient ID: [name], female    DOB: 02/06/1946  Age: 70 y.o. MRN: [id number]  CC: No chief complaint on file.   HPI [name] presents for HTN f/u. C/o nausea x 2-3 h  Outpatient Prescriptions Prior to Visit  Medication Sig Dispense Refill  . albuterol (PROVENTIL HFA;VENTOLIN HFA) 108 (90 BASE) MCG/ACT inhaler Inhale 1 puff into the lungs 4 (four) times daily as needed for wheezing or shortness of breath.    . ALPRAZolam (XANAX) 0.5 MG tablet TAKE ONE TABLET AT BEDTIME AS NEEDED 30 tablet 3  . ASACOL HD 800 MG TBEC TAKE 3 TABLETS TWICE DAILY 180 each 2  . buPROPion (WELLBUTRIN XL) 300 MG 24 hr tablet Take 1 tablet (300 mg total) by mouth daily. 90 tablet 1  . calcium citrate-vitamin D (CITRACAL+D) 315-200 MG-UNIT per tablet Take 1 tablet by mouth 2 (two) times daily.    . Chlorpheniramine Maleate (ALLERGY PO) Take 1 tablet by mouth daily.    . Cholecalciferol (VITAMIN D PO) Take 1 tablet by mouth daily.    . CONCERTA 36 MG CR tablet Take 36 mg by mouth daily.     . Cyanocobalamin (VITAMIN B-12 PO) Take 2 tablets by mouth daily.    [name] FLUoxetine (PROZAC) 20 MG capsule Take 60 mg by mouth daily.    [name] HYDROmorphone (DILAUDID) 4 MG tablet Take 0.5-1 tablets (2-4 mg total) by mouth 2 (two) times daily as needed for severe pain. 60 tablet 0  . lamoTRIgine (LAMICTAL) 100 MG tablet Take 1 tablet by mouth 3 (three) times daily.    . meloxicam (MOBIC) 15 MG tablet Take 15 mg by mouth daily.     . methocarbamol (ROBAXIN) 500 MG tablet Take 1 tablet (500 mg total) by mouth 4 (four) times daily. 60 tablet 1  . Omega-3 Fatty Acids (OMEGA 3 PO) Take 1 capsule by mouth daily.    . pantoprazole (PROTONIX) 40 MG tablet TAKE ONE TABLET TWICE DAILY 60 tablet 6  . Probiotic Product (PROBIOTIC PO) Take 1 capsule by mouth daily.    [name] SYNTHROID 100 MCG tablet TAKE ONE TABLET EVERY MORNING 30 tablet 5  . amLODipine-valsartan (EXFORGE) 5-160 MG tablet Take 1 tablet by mouth daily. 30  tablet 11  . promethazine (PHENERGAN) 12.5 MG tablet Take 1 tablet (12.5 mg total) by mouth every 6 (six) hours as needed for nausea or vomiting. 30 tablet 3   No facility-administered medications prior to visit.    ROS Review of Systems  Constitutional: Positive for fatigue. Negative for chills, activity change, appetite change and unexpected weight change.  HENT: Negative for congestion, mouth sores and sinus pressure.   Eyes: Negative for visual disturbance.  Respiratory: Negative for cough and chest tightness.   Gastrointestinal: Positive for nausea. Negative for abdominal pain.  Genitourinary: Negative for frequency, difficulty urinating and vaginal pain.  Musculoskeletal: Positive for gait problem. Negative for back pain.  Skin: Negative for pallor and rash.  Neurological: Negative for dizziness, tremors, weakness, numbness and headaches.  Psychiatric/Behavioral: Negative for confusion and sleep disturbance.    Objective:  BP 140/74 mmHg  Pulse 81  Temp(Src) 98.5 F (36.9 C) (Oral)  Wt 153 lb (69.4 kg)  SpO2 97%  BP Readings from Last 3 Encounters:  07/31/15 140/74  06/30/15 118/80  06/20/15 118/64    Wt Readings from Last 3 Encounters:  07/31/15 153 lb (69.4 kg)  06/30/15 155 lb (70.308 kg)  05/24/15 156 lb 9.6 oz (  71.033 kg)    Physical Exam  Constitutional: She appears well-developed. No distress.  HENT:  Head: Normocephalic.  Right Ear: External ear normal.  Left Ear: External ear normal.  Nose: Nose normal.  Mouth/Throat: Oropharynx is clear and moist.  Eyes: Conjunctivae are normal. Pupils are equal, round, and reactive to light. Right eye exhibits no discharge. Left eye exhibits no discharge.  Neck: Normal range of motion. Neck supple. No JVD present. No tracheal deviation present. No thyromegaly present.  Cardiovascular: Normal rate, regular rhythm and normal heart sounds.   Pulmonary/Chest: No stridor. No respiratory distress. She has no wheezes.    Abdominal: Soft. Bowel sounds are normal. She exhibits no distension and no mass. There is no tenderness. There is no rebound and no guarding.  Musculoskeletal: She exhibits tenderness. She exhibits no edema.  Lymphadenopathy:    She has no cervical adenopathy.  Neurological: She displays normal reflexes. No cranial nerve deficit. She exhibits normal muscle tone. Coordination normal.  Skin: No rash noted. No erythema.  Psychiatric: She has a normal mood and affect. Her behavior is normal. Judgment and thought content normal.  cane  Lab Results  Component Value Date   WBC 9.5 05/05/2015   HGB 13.0 05/05/2015   HCT 40.6 05/05/2015   PLT 327.0 05/05/2015   GLUCOSE 81 05/05/2015   CHOL 259* 03/30/2014   TRIG 177.0* 03/30/2014   HDL 47.40 03/30/2014   LDLDIRECT 182.5 02/02/2008   LDLCALC 176* 03/30/2014   ALT 12 05/05/2015   AST 11 05/05/2015   NA 141 05/05/2015   K 4.5 05/05/2015   CL 103 05/05/2015   CREATININE 0.82 05/05/2015   BUN 20 05/05/2015   CO2 30 05/05/2015   TSH 0.77 05/05/2015   INR 1.0 11/25/2008   HGBA1C 5.5 04/24/2012    Ct Lumbar Spine Wo Contrast  03/20/2015  CLINICAL DATA:  BILATERAL leg numbness and weakness with occasional spasms. Previous lumbar surgery 06/20/2014. RIGHT leg pain. EXAM: CT LUMBAR SPINE WITHOUT CONTRAST TECHNIQUE: Multidetector CT imaging of the lumbar spine was performed without intravenous contrast administration. Multiplanar CT image reconstructions were also generated. COMPARISON:  Lumbar plain films most recent 02/20/2015. FINDINGS: The patient has undergone L1 through L5 XLIF and L5-S1 ALIF. There is no solid interbody bridging at any level except for L2-L3 on the LEFT. Unilateral LEFT screw placement at L2, otherwise BILATERAL pedicle screw placement from L1 through S1. BILATERAL screw loosening at S1. Unilateral RIGHT L1 screw loosening. Mild stenosis L4-L5 and L3-L4 related to posterior element hypertrophy. No paravertebral mass or signs  of infection. BILATERAL foraminal narrowing is greatest at L5-S1, worse on the RIGHT although moderate foraminal narrowing can be seen at L3-4 and L4-5 on the RIGHT. Trace anterolisthesis L4-5. Scoliotic deformity convex RIGHT centered at L2 approximately 14 degrees. No paravertebral mass. 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 Beginning arthrodesis is seen across the L2-3 interspace on the LEFT. See discussion above. With regard to RIGHT leg pain, foraminal narrowing at L5-S1 on the RIGHT could be symptomatic. Correlate clinically for RIGHT L5 radiculopathy. Electronically Signed   By: Staci Righter M.D.   On: 03/20/2015 16:36    Assessment & Plan:   Diagnoses and all orders for this visit:  Nausea without vomiting -     ondansetron (ZOFRAN) injection 4 mg; Inject 2 mLs (4 mg total) into the muscle once.  Other orders -     amLODipine-valsartan (  EXFORGE) 5-160 MG tablet; Take 1 tablet by mouth daily. -     ondansetron (ZOFRAN) 4 MG tablet; Take 1 tablet (4 mg total) by mouth every 8 (eight) hours as needed for nausea or vomiting.  I have discontinued Ms. Yokoyama's promethazine and valsartan. I am also having her start on ondansetron. Additionally, I am having her maintain her FLUoxetine, albuterol, Omega-3 Fatty Acids (OMEGA 3 PO), Probiotic Product (PROBIOTIC PO), Cholecalciferol (VITAMIN D PO), Cyanocobalamin (VITAMIN B-12 PO), calcium citrate-vitamin D, Chlorpheniramine Maleate (ALLERGY PO), ASACOL HD, buPROPion, CONCERTA, pantoprazole, ALPRAZolam, SYNTHROID, meloxicam, lamoTRIgine, HYDROmorphone, methocarbamol, and amLODipine-valsartan. We administered ondansetron.  Meds ordered this encounter  Medications  . DISCONTD: valsartan (DIOVAN) 160 MG tablet    Sig: Take 1 tablet by mouth daily.  [name] amLODipine-valsartan (EXFORGE) 5-160 MG tablet    Sig: Take 1 tablet by mouth daily.     Dispense:  30 tablet    Refill:  11  . ondansetron (ZOFRAN) 4 MG tablet    Sig: Take 1 tablet (4 mg total) by mouth every 8 (eight) hours as needed for nausea or vomiting.    Dispense:  20 tablet    Refill:  2  . ondansetron (ZOFRAN) injection 4 mg    Sig:      Follow-up: Return in about 3 months (around 10/28/2015) for a follow-up visit.  Walker Kehr, MD

## 2015-07-31 NOTE — Progress Notes (Signed)
Pre visit review using our clinic review tool, if applicable. No additional management support is needed unless otherwise documented below in the visit note. 

## 2015-08-01 ENCOUNTER — Ambulatory Visit
Admission: RE | Admit: 2015-08-01 | Discharge: 2015-08-01 | Disposition: A | Discharge status: Home / Self Care | Payer: Medicare Other | Source: Ambulatory Visit | Attending: Diagnostic Neuroimaging | Admitting: Diagnostic Neuroimaging

## 2015-08-01 ENCOUNTER — Ambulatory Visit: Payer: Medicare Other | Admitting: Diagnostic Neuroimaging

## 2015-08-01 DIAGNOSIS — R413 Other amnesia: Secondary | ICD-10-CM

## 2015-08-01 DIAGNOSIS — M4120 Other idiopathic scoliosis, site unspecified: Secondary | ICD-10-CM | POA: Diagnosis not present

## 2015-08-02 ENCOUNTER — Encounter: Payer: Self-pay | Admitting: Diagnostic Neuroimaging

## 2015-08-02 DIAGNOSIS — R110 Nausea: Secondary | ICD-10-CM

## 2015-08-02 MED ORDER — ONDANSETRON HCL 4 MG/2ML IJ SOLN
4.0000 mg | Freq: Once | INTRAMUSCULAR | Status: AC
  Administered 2015-08-02: 4 mg via INTRAMUSCULAR

## 2015-08-03 NOTE — Assessment & Plan Note (Signed)
Exforge qd

## 2015-08-07 DIAGNOSIS — Z6825 Body mass index (BMI) 25.0-25.9, adult: Secondary | ICD-10-CM | POA: Diagnosis not present

## 2015-08-07 DIAGNOSIS — M545 Low back pain: Secondary | ICD-10-CM | POA: Diagnosis not present

## 2015-08-07 DIAGNOSIS — M5416 Radiculopathy, lumbar region: Secondary | ICD-10-CM | POA: Diagnosis not present

## 2015-08-07 DIAGNOSIS — M4120 Other idiopathic scoliosis, site unspecified: Secondary | ICD-10-CM | POA: Diagnosis not present

## 2015-08-07 DIAGNOSIS — R030 Elevated blood-pressure reading, without diagnosis of hypertension: Secondary | ICD-10-CM | POA: Diagnosis not present

## 2015-08-08 ENCOUNTER — Ambulatory Visit (INDEPENDENT_AMBULATORY_CARE_PROVIDER_SITE_OTHER): Payer: Medicare Other | Admitting: Diagnostic Neuroimaging

## 2015-08-08 ENCOUNTER — Encounter: Payer: Self-pay | Admitting: Diagnostic Neuroimaging

## 2015-08-08 VITALS — BP 138/74 | HR 78 | Wt 152.0 lb

## 2015-08-08 DIAGNOSIS — R413 Other amnesia: Secondary | ICD-10-CM

## 2015-08-08 NOTE — Patient Instructions (Signed)

## 2015-08-08 NOTE — Progress Notes (Signed)
[facility]  PATIENT: [name] DOB: 1946-06-23  REFERRING CLINICIAN: Burns HISTORY FROM: patient  REASON FOR VISIT: follow up     HISTORICAL  CHIEF COMPLAINT:  Chief Complaint  Patient presents with  . Memory Loss    rm 7, MMSE 25  . Follow-up    1 month    HISTORY OF PRESENT ILLNESS:   UPDATE 08/08/15: Since last visit, doing well. Memory loss is better. Depression stable. Still with back issues and balance issues.  PRIOR HPI (05/24/15): 70 year old right-handed female here for evaluation of slurred speech, confusion, balance issues. Patient had episode of hypertension 200/100 in early November 2016, went to emergency room in [place], and was treated with high blood pressure medication. She followed up with primary care clinic and apparently she was having side effects and therefore adjustments were made. At some point patient had episode of falling backwards, hitting her head, with resultant slurred speech and double vision. Patient went to emergency room for evaluation and was discharged home. She then followed up with her PCP clinic again and apparently her daughter came for a visit with her. Apparently her daughter had noted increasing slurred speech, tremor, balance, slurring speech, confusion and forgetfulness. Unfortunately patient is alone for this visit today and I do not have any information from patient's daughter to confirm. Obtain information by reviewing chart notes extensively and summarizing as above.   REVIEW OF SYSTEMS: Full 14 system review of systems performed and notable only for fatigue excess sweating trouble swallowing.    ALLERGIES: Allergies  Allergen Reactions  . Ampicillin Nausea And Vomiting  . Erythromycin Nausea And Vomiting  . Hctz [Hydrochlorothiazide]     dizzy  . Metoprolol     doublevision   . Morphine And Related Nausea And Vomiting    HOME MEDICATIONS: Outpatient Prescriptions Prior to Visit  Medication  Sig Dispense Refill  . albuterol (PROVENTIL HFA;VENTOLIN HFA) 108 (90 BASE) MCG/ACT inhaler Inhale 1 puff into the lungs 4 (four) times daily as needed for wheezing or shortness of breath.    . ALPRAZolam (XANAX) 0.5 MG tablet TAKE ONE TABLET AT BEDTIME AS NEEDED 30 tablet 3  . amLODipine-valsartan (EXFORGE) 5-160 MG tablet Take 1 tablet by mouth daily. 30 tablet 11  . ASACOL HD 800 MG TBEC TAKE 3 TABLETS TWICE DAILY 180 each 2  . buPROPion (WELLBUTRIN XL) 300 MG 24 hr tablet Take 1 tablet (300 mg total) by mouth daily. 90 tablet 1  . calcium citrate-vitamin D (CITRACAL+D) 315-200 MG-UNIT per tablet Take 1 tablet by mouth 2 (two) times daily.    . Chlorpheniramine Maleate (ALLERGY PO) Take 1 tablet by mouth daily.    . Cholecalciferol (VITAMIN D PO) Take 1 tablet by mouth daily.    . CONCERTA 36 MG CR tablet Take 36 mg by mouth daily.     . Cyanocobalamin (VITAMIN B-12 PO) Take 2 tablets by mouth daily.    [name] FLUoxetine (PROZAC) 20 MG capsule Take 60 mg by mouth daily.    [name] HYDROmorphone (DILAUDID) 4 MG tablet Take 0.5-1 tablets (2-4 mg total) by mouth 2 (two) times daily as needed for severe pain. 60 tablet 0  . lamoTRIgine (LAMICTAL) 100 MG tablet Take 1 tablet by mouth 3 (three) times daily.    . meloxicam (MOBIC) 15 MG tablet Take 15 mg by mouth daily.     . methocarbamol (ROBAXIN) 500 MG tablet Take 1 tablet (500 mg total) by mouth 4 (four) times daily. [place]  tablet 1  . Omega-3 Fatty Acids (OMEGA 3 PO) Take 1 capsule by mouth daily.    . ondansetron (ZOFRAN) 4 MG tablet Take 1 tablet (4 mg total) by mouth every 8 (eight) hours as needed for nausea or vomiting. 20 tablet 2  . pantoprazole (PROTONIX) 40 MG tablet TAKE ONE TABLET TWICE DAILY 60 tablet 6  . Probiotic Product (PROBIOTIC PO) Take 1 capsule by mouth daily.    [name] SYNTHROID 100 MCG tablet TAKE ONE TABLET EVERY MORNING 30 tablet 5   No facility-administered medications prior to visit.    PAST MEDICAL HISTORY: Past Medical History    Diagnosis Date  . Anxiety   . Depression   . Thyroid disease   . Allergy   . GERD (gastroesophageal reflux disease)   . IBS (irritable bowel syndrome)   . Ulcerative colitis   . Attention deficit disorder without mention of hyperactivity   . Unspecified asthma(493.90)   . Osteoarthrosis, unspecified whether generalized or localized, unspecified site   . Other and unspecified hyperlipidemia   . Diverticulosis   . Colitis 12/05/2010  . Unspecified essential hypertension     taken off meds since lost wt  . H/O hiatal hernia     PAST SURGICAL HISTORY: Past Surgical History  Procedure Laterality Date  . Colonoscopy    . Upper gastrointestinal endoscopy    . Back surgery      fluid removed from between disks  . Shoulder surgery      right shoulder 3x   . Total shoulder arthroplasty    . Toe surgery      right big toe  . Breast surgery      bil breast reduction   . Hip fracture surgery      left  11/01/2013  . Cholecystectomy      Laparoscopic cholecystectomy  . Anterior lumbar fusion N/A 04/22/2014    Procedure: Lumbar five/-Sacral one. Anterior lumbar interbody fusion with Dr. Scot Dock; Left Lumbar one/two, two/three/three/four, four/five. Anterior lateral lumbar interbody fusion**Stage 1**;  Surgeon: Erline Levine, MD;  Location: [place] [facility];  Service: Neurosurgery;  Laterality: N/A;  . Anterior lateral lumbar fusion 4 levels Left 04/22/2014    Procedure: Left Lumbar one/two, two/three, three/four, four,five. Anterior lateral lumbar interbody fusion**Stage 1**;  Surgeon: Erline Levine, MD;  Location: [place] [facility];  Service: Neurosurgery;  Laterality: Left;  . Abdominal exposure N/A 04/22/2014    Procedure: ABDOMINAL EXPOSURE;  Surgeon: Angelia Mould, MD;  Location: [place] [facility];  Service: Vascular;  Laterality: N/A;  . Lumbar percutaneous pedicle screw 4 level N/A 04/25/2014    Procedure: **Stage 2** Percutaneous pedicle screw placement L1-5;  Surgeon: Erline Levine, MD;   Location: [place] [facility];  Service: Neurosurgery;  Laterality: N/A;  **Stage 2** Percutaneous pedicle screw placement L1-5    FAMILY HISTORY: Family History  Problem Relation Age of Onset  . Hypertension Father   . Colon cancer Father 43  . Arthritis Father   . Heart disease Father     CHF  . Diabetes Neg Hx   . Heart disease Mother     Died, 48  . Healthy Brother   . Healthy Daughter     SOCIAL HISTORY:  Social History   Social History  . Marital Status: Married    Spouse Name: N/A  . Number of Children: 3  . Years of Education: 16   Occupational History  . interior decorator     retired   Social History Main  Topics  . Smoking status: Former Smoker    Quit date: 10/30/1978  . Smokeless tobacco: Never Used  . Alcohol Use: 0.5 oz/week    1 Standard drinks or equivalent per week     Comment: once a week  . Drug Use: No  . Sexual Activity: Not on file   Other Topics Concern  . Not on file   Social History Narrative   [employer]; post-graduate study [employer].  married '68.  3 children: 2 daughters - '70, '72; 1 son '82; 6 grand-children. Lost her mother 2008, elderly father lives alone-in [place] died in 2022-09-14. owner/operator interior Agricultural consultant business, semi-retired. Lives with husband. Marriage is a work in progress      Caffeine - coffee 1 cup daily     PHYSICAL EXAM  GENERAL EXAM/CONSTITUTIONAL: Vitals:  Filed Vitals:   08/08/15 1541  BP: 138/74  Pulse: 78  Weight: 152 lb (68.947 kg)   Body mass index is 25.29 kg/(m^2). No exam data present  Patient is in no distress; well developed, nourished and groomed; neck is supple  CARDIOVASCULAR:  Examination of carotid arteries is normal; no carotid bruits  Regular rate and rhythm, no murmurs  Examination of peripheral vascular system by observation and palpation is normal  EYES:  Ophthalmoscopic exam of optic discs and posterior segments is normal; no papilledema or  hemorrhages  MUSCULOSKELETAL:  Gait, strength, tone, movements noted in Neurologic exam below  NEUROLOGIC: MENTAL STATUS:  MMSE - Bailey Exam 08/08/2015 05/24/2015  Orientation to time 4 4  Orientation to Place 5 5  Registration 3 3  Attention/ Calculation 3 2  Recall 1 1  Language- name 2 objects 2 2  Language- repeat 1 1  Language- follow 3 step command 3 3  Language- read & follow direction 1 1  Write a sentence 1 1  Copy design 1 0  Total score 25 23    awake, alert, oriented to person, place, time  DECR MEMORY   DECR ATTENTION AND CONCENTRATION  language fluent, comprehension intact, naming intact,   fund of knowledge appropriate  CRANIAL NERVE:   2nd - no papilledema on fundoscopic exam  2nd, 3rd, 4th, 6th - pupils equal and reactive to light, visual fields full to confrontation, extraocular muscles intact, no nystagmus  5th - facial sensation symmetric  7th - facial strength symmetric  8th - hearing intact  9th - palate elevates symmetrically, uvula midline  11th - shoulder shrug symmetric  12th - tongue protrusion midline  MOTOR:   normal bulk and tone, full strength in the BUE, BLE; MILD POSTURAL TREMOR IN BUE  SENSORY:   normal and symmetric to light touch, temperature; DECR VIBRATION  COORDINATION:   finger-nose-finger, fine finger movements, heel-shin normal  REFLEXES:   deep tendon reflexes --> BUE 2, KNEES 1, ANKLES 0  GAIT/STATION:   ANTALAGIC GAIT; RIGHT KNEE ARTHRITIS DEFORMITY      DIAGNOSTIC DATA (LABS, IMAGING, TESTING) - I reviewed patient records, labs, notes, testing and imaging myself where available.  Lab Results  Component Value Date   WBC 9.5 05/05/2015   HGB 13.0 05/05/2015   HCT 40.6 05/05/2015   MCV 83.6 05/05/2015   PLT 327.0 05/05/2015      Component Value Date/Time   NA 141 05/05/2015 1057   K 4.5 05/05/2015 1057   CL 103 05/05/2015 1057   CO2 30 05/05/2015 1057   GLUCOSE 81  05/05/2015 1057   BUN 20 05/05/2015 1057  CREATININE 0.82 05/05/2015 1057   CALCIUM 9.8 05/05/2015 1057   PROT 6.9 05/05/2015 1057   ALBUMIN 4.2 05/05/2015 1057   AST 11 05/05/2015 1057   ALT 12 05/05/2015 1057   ALKPHOS 81 05/05/2015 1057   BILITOT 0.3 05/05/2015 1057   GFRNONAA >90 04/24/2014 1045   GFRAA >90 04/24/2014 1045   Lab Results  Component Value Date   CHOL 259* 03/30/2014   HDL 47.40 03/30/2014   LDLCALC 176* 03/30/2014   LDLDIRECT 182.5 02/02/2008   TRIG 177.0* 03/30/2014   CHOLHDL 5 03/30/2014   Lab Results  Component Value Date   HGBA1C 5.5 04/24/2012   Lab Results  Component Value Date   VITAMINB12 >1500* 12/22/2014   Lab Results  Component Value Date   TSH 0.77 05/05/2015    03/20/15 CT lumbar spine (without)  - Status post L1 through L5 XLIF and L5-S1 ALIF. Beginning arthrodesis is seen across the L2-3 interspace on the LEFT. See discussion above.  - With regard to RIGHT leg pain, foraminal narrowing at L5-S1 on the RIGHT could be symptomatic. Correlate clinically for RIGHT L5 radiculopathy.   08/01/15 MRI brain (without) demonstrating: 1. Mild periventricular and subcortical and pontine chronic small vessel ischemic disease.  2. No acute findings. 3. No significant change from MRI on 03/25/11.     ASSESSMENT AND PLAN  69 y.o. year old female here alone for this visit, with suspected subacute to chronic memory loss and confusion, with more recent problems in the setting of uncontrolled hypertension, fall, concussion, and probable postconcussion syndrome. Patient also with history of lumbar spinal stenosis and peripheral neuropathy.   Ddx: pseudo-dementia vs MCI vs mild dementia  Memory loss    PLAN: - monitor symptoms - advised patient that she may return with her daughter and her spouse to provide collateral history if requested or if memory symptoms worsen  Return if symptoms worsen or fail to improve, for return to PCP.     Penni Bombard, MD 06/24/7508, 2:58 PM Certified in Neurology, Neurophysiology and Neuroimaging  [facility] [address], [place], [place] [address] [phone]

## 2015-08-16 ENCOUNTER — Other Ambulatory Visit: Payer: Self-pay | Admitting: Internal Medicine

## 2015-08-16 DIAGNOSIS — M25551 Pain in right hip: Secondary | ICD-10-CM | POA: Diagnosis not present

## 2015-08-22 DIAGNOSIS — M25551 Pain in right hip: Secondary | ICD-10-CM | POA: Diagnosis not present

## 2015-08-24 DIAGNOSIS — M25551 Pain in right hip: Secondary | ICD-10-CM | POA: Diagnosis not present

## 2015-08-29 DIAGNOSIS — M25551 Pain in right hip: Secondary | ICD-10-CM | POA: Diagnosis not present

## 2015-08-30 ENCOUNTER — Ambulatory Visit (INDEPENDENT_AMBULATORY_CARE_PROVIDER_SITE_OTHER): Payer: Medicare Other | Admitting: Internal Medicine

## 2015-08-30 ENCOUNTER — Encounter: Payer: Self-pay | Admitting: Internal Medicine

## 2015-08-30 VITALS — BP 120/70 | HR 80 | Ht 65.0 in | Wt 151.0 lb

## 2015-08-30 DIAGNOSIS — K5190 Ulcerative colitis, unspecified, without complications: Secondary | ICD-10-CM | POA: Diagnosis not present

## 2015-08-30 DIAGNOSIS — R159 Full incontinence of feces: Secondary | ICD-10-CM | POA: Diagnosis not present

## 2015-08-30 DIAGNOSIS — R197 Diarrhea, unspecified: Secondary | ICD-10-CM | POA: Diagnosis not present

## 2015-08-30 NOTE — Progress Notes (Signed)
HISTORY OF PRESENT ILLNESS:  [name] is a 70 y.o. 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 I last saw the patient 02/26/2013 during her induction phase of Remicade therapy by a rheumatologist for arthritis (not in consultation with GI, though Biologics previously discussed with her). At that time she was on mesalamine 4.8 g daily. She was to continue on Remicade infusions for her arthritis and potential benefit for her colitis. I requested that she follow up in 3 months, but she did not. She has not been seen since September 2014. She had a hip fracture May 2015 and back surgery October 2015. Patient states that she continued on Remicade therapy and was having no GI symptoms. However, about 6 months ago she tells me that she stopped Remicade infusion therapy because her rheumatologist office was disorganized and splitting up. About 4 months ago she began to develop increased frequency of bowel movements. Over the past month she reports significant loose stools with urgency and occasional incontinence. There is no blood or abdominal pain but there is mucus per rectum. She was considering resuming Remicade for her arthritis which has worsened off therapy. She is currently taking mesalamine 2.4 g daily, as opposed to 4.8 g daily, secondary to cost. She denies fevers. She reports decreased appetite. Her weight has been stable. She does take meloxicam for arthritis. Continues on pantoprazole with good control of GERD symptoms. She is pleasant but somewhat disorganized historian  REVIEW OF SYSTEMS:  All non-GI ROS negative except for sinus allergy, anxiety, arthritis, fatigue, night sweats, sleeping problems, ankle swelling  Past Medical History  Diagnosis Date  . Anxiety   . Depression   . Thyroid disease   . Allergy   . GERD (gastroesophageal reflux disease)   . IBS (irritable bowel syndrome)   . Ulcerative colitis   .  Attention deficit disorder without mention of hyperactivity   . Unspecified asthma(493.90)   . Osteoarthrosis, unspecified whether generalized or localized, unspecified site   . Other and unspecified hyperlipidemia   . Diverticulosis   . Colitis 12/05/2010  . Unspecified essential hypertension     taken off meds since lost wt  . H/O hiatal hernia   . Hypertension   . Colon polyps     Past Surgical History  Procedure Laterality Date  . Colonoscopy    . Upper gastrointestinal endoscopy    . Back surgery      fluid removed from between disks  . Shoulder surgery      right shoulder 3x   . Total shoulder arthroplasty    . Toe surgery      right big toe  . Breast surgery      bil breast reduction   . Hip fracture surgery      left  11/01/2013  . Cholecystectomy      Laparoscopic cholecystectomy  . Anterior lumbar fusion N/A 04/22/2014    Procedure: Lumbar five/-Sacral one. Anterior lumbar interbody fusion with Dr. Scot Dock; Left Lumbar one/two, two/three/three/four, four/five. Anterior lateral lumbar interbody fusion**Stage 1**;  Surgeon: Erline Levine, MD;  Location: [place] [facility];  Service: Neurosurgery;  Laterality: N/A;  . Anterior lateral lumbar fusion 4 levels Left 04/22/2014    Procedure: Left Lumbar one/two, two/three, three/four, four,five. Anterior lateral lumbar interbody fusion**Stage 1**;  Surgeon: Erline Levine, MD;  Location: [facility];  Service: Neurosurgery;  Laterality: Left;  . Abdominal exposure N/A 04/22/2014    Procedure: ABDOMINAL EXPOSURE;  Surgeon: Angelia Mould, MD;  Location: [facility];  Service: Vascular;  Laterality: N/A;  . Lumbar percutaneous pedicle screw 4 level N/A 04/25/2014    Procedure: **Stage 2** Percutaneous pedicle screw placement L1-5;  Surgeon: Erline Levine, MD;  Location: [place] [facility];  Service: Neurosurgery;  Laterality: N/A;  **Stage 2** Percutaneous pedicle screw placement L1-5    Social History [name]  reports that  she quit smoking about 36 years ago. She has never used smokeless tobacco. She reports that she drinks about 0.5 oz of alcohol per week. She reports that she does not use illicit drugs.  family history includes Arthritis in her father; Colon cancer (age of onset: 20) in her father; Healthy in her brother and daughter; Heart disease in her father and mother; Hypertension in her father. There is no history of Diabetes.  Allergies  Allergen Reactions  . Ampicillin Nausea And Vomiting  . Erythromycin Nausea And Vomiting  . Hctz [Hydrochlorothiazide]     dizzy  . Metoprolol     doublevision   . Morphine And Related Nausea And Vomiting       PHYSICAL EXAMINATION: Vital signs: BP 120/70 mmHg  Pulse 80  Ht 5' 5"  (1.651 m)  Wt 151 lb (68.493 kg)  BMI 25.13 kg/m2  Constitutional: generally well-appearing, no acute distress Psychiatric: alert and oriented x3, cooperative Eyes: extraocular movements intact, anicteric, conjunctiva pink Mouth: oral pharynx moist, no lesions. No thrush Neck: supple without thyromegaly, no lymphadenopathy Cardiovascular: heart regular rate and rhythm, no murmur Lungs: clear to auscultation bilaterally Abdomen: soft, nontender, nondistended, no obvious ascites, no peritoneal signs, normal bowel sounds, no organomegaly Rectal: Ommitted Extremities: no clubbing cyanosis or lower extremity edema bilaterally Skin: no lesions on visible extremities Neuro: No focal deficits. Cranial nerves intact. Normal DTRs  ASSESSMENT:  #1. Mild-to-moderate ulcerative colitis diagnosed December 2013. Had been on Remicade for about 2 years, for arthritis, without GI symptoms. Subsequently stopped Remicade as described with recurrent colitis symptoms which are at present mild #2. GERD. Controlled with PPI #3. History of adenomatous colon polyps. Due for surveillance December 2018 #4. Suboptimal medical compliance   PLAN:  #1. I advised her to increase mesalamine to 4.8 g  daily. She said that she cannot secondary to cost. We are looking into more affordable mesalamine alternatives, though not sure there are any #2. She stated that she was interested in resuming Remicade therapy with her rheumatologist. I told her that this may be helpful for her IBD can. However, I carefully cautioned regarding the possibility of infusion reaction after the hiatus. As well, possible loss of effect if antibodies develop. Finally, discussed different same class option (Humira) and potential different class biologic option. She seemed to understand. We spent a considerable amount of time discussing this #3. Routine GI follow-up 3 months. Medical compliance stressed. Contact the office in the interim for questions or problems. If she is back on Biologics without issues, TB testing and vaccination should be up-to-date  40 minutes was spent face-to-face with the patient. The overwhelming amount time is use for counseling regarding her ulcerative colitis and treatment strategies with focus on risk, benefits, and options

## 2015-08-30 NOTE — Patient Instructions (Signed)
Continue taking Delzicol - 8 capsules twice a day

## 2015-08-31 DIAGNOSIS — M25551 Pain in right hip: Secondary | ICD-10-CM | POA: Diagnosis not present

## 2015-09-07 ENCOUNTER — Telehealth: Payer: Self-pay | Admitting: Internal Medicine

## 2015-09-07 DIAGNOSIS — M5416 Radiculopathy, lumbar region: Secondary | ICD-10-CM | POA: Diagnosis not present

## 2015-09-07 MED ORDER — TRAMADOL HCL 50 MG PO TABS: 50.0000 mg | ORAL_TABLET | Freq: Two times a day (BID) | ORAL | Status: DC | PRN

## 2015-09-07 NOTE — Telephone Encounter (Signed)
Pt called requesting a prescription for Tramadol. She stated Dr. Alain Marion will know what its all about. [facility] is [facility]

## 2015-09-07 NOTE — Telephone Encounter (Signed)
Ok pls call in Thx 

## 2015-09-08 NOTE — Telephone Encounter (Signed)
Notified pt rx has been called into pharmacy spoke w/David gave MD authorization...[name]

## 2015-09-11 DIAGNOSIS — M4120 Other idiopathic scoliosis, site unspecified: Secondary | ICD-10-CM | POA: Diagnosis not present

## 2015-09-11 DIAGNOSIS — I10 Essential (primary) hypertension: Secondary | ICD-10-CM | POA: Diagnosis not present

## 2015-09-11 DIAGNOSIS — Z6825 Body mass index (BMI) 25.0-25.9, adult: Secondary | ICD-10-CM | POA: Diagnosis not present

## 2015-09-11 DIAGNOSIS — M545 Low back pain: Secondary | ICD-10-CM | POA: Diagnosis not present

## 2015-09-11 DIAGNOSIS — M4806 Spinal stenosis, lumbar region: Secondary | ICD-10-CM | POA: Diagnosis not present

## 2015-09-11 DIAGNOSIS — M47816 Spondylosis without myelopathy or radiculopathy, lumbar region: Secondary | ICD-10-CM | POA: Diagnosis not present

## 2015-09-12 ENCOUNTER — Telehealth: Payer: Self-pay | Admitting: Internal Medicine

## 2015-09-12 NOTE — Telephone Encounter (Signed)
Ok tomorrow 4:30 Thx

## 2015-09-12 NOTE — Telephone Encounter (Signed)
Pt called in and wants her kidney check today.  She wants Dr Camila Li to work her in today.

## 2015-09-13 NOTE — Telephone Encounter (Signed)
Left mess for patient to call back to see if she can come in for OV with PCP on 09/14/15 at 11:45 am or 1 pm.

## 2015-09-14 ENCOUNTER — Encounter: Payer: Self-pay | Admitting: Internal Medicine

## 2015-09-14 ENCOUNTER — Ambulatory Visit (INDEPENDENT_AMBULATORY_CARE_PROVIDER_SITE_OTHER): Payer: Medicare Other | Admitting: Internal Medicine

## 2015-09-14 ENCOUNTER — Other Ambulatory Visit (INDEPENDENT_AMBULATORY_CARE_PROVIDER_SITE_OTHER): Payer: Medicare Other

## 2015-09-14 VITALS — BP 138/82 | HR 87 | Temp 98.4°F | Ht 65.0 in | Wt 152.0 lb

## 2015-09-14 DIAGNOSIS — M545 Low back pain, unspecified: Secondary | ICD-10-CM

## 2015-09-14 DIAGNOSIS — R5381 Other malaise: Secondary | ICD-10-CM | POA: Diagnosis not present

## 2015-09-14 DIAGNOSIS — K5190 Ulcerative colitis, unspecified, without complications: Secondary | ICD-10-CM | POA: Diagnosis not present

## 2015-09-14 DIAGNOSIS — M79605 Pain in left leg: Secondary | ICD-10-CM

## 2015-09-14 DIAGNOSIS — E559 Vitamin D deficiency, unspecified: Secondary | ICD-10-CM | POA: Diagnosis not present

## 2015-09-14 DIAGNOSIS — R109 Unspecified abdominal pain: Secondary | ICD-10-CM | POA: Diagnosis not present

## 2015-09-14 DIAGNOSIS — R1032 Left lower quadrant pain: Secondary | ICD-10-CM | POA: Diagnosis not present

## 2015-09-14 DIAGNOSIS — R209 Unspecified disturbances of skin sensation: Secondary | ICD-10-CM | POA: Diagnosis not present

## 2015-09-14 DIAGNOSIS — R5383 Other fatigue: Secondary | ICD-10-CM | POA: Diagnosis not present

## 2015-09-14 DIAGNOSIS — R202 Paresthesia of skin: Secondary | ICD-10-CM | POA: Diagnosis not present

## 2015-09-14 DIAGNOSIS — O26899 Other specified pregnancy related conditions, unspecified trimester: Secondary | ICD-10-CM

## 2015-09-14 DIAGNOSIS — M25569 Pain in unspecified knee: Secondary | ICD-10-CM | POA: Diagnosis not present

## 2015-09-14 LAB — BASIC METABOLIC PANEL
BUN: 8 mg/dL (ref 6–23)
CALCIUM: 9.7 mg/dL (ref 8.4–10.5)
CO2: 30 mEq/L (ref 19–32)
CREATININE: 0.77 mg/dL (ref 0.40–1.20)
Chloride: 106 mEq/L (ref 96–112)
GFR: 78.82 mL/min (ref >60.00)
Glucose, Bld: 99 mg/dL (ref 70–99)
Potassium: 3.9 mEq/L (ref 3.5–5.1)
SODIUM: 144 meq/L (ref 135–145)

## 2015-09-14 LAB — CBC WITH DIFFERENTIAL/PLATELET
BASOS PCT: 0.3 % (ref 0.0–3.0)
Basophils Absolute: 0 10*3/uL (ref 0.0–0.1)
EOS ABS: 0.2 10*3/uL (ref 0.0–0.7)
EOS PCT: 1.9 % (ref 0.0–5.0)
HEMATOCRIT: 35.5 % — AB (ref 36.0–46.0)
HEMOGLOBIN: 11.8 g/dL — AB (ref 12.0–15.0)
LYMPHS PCT: 15.7 % (ref 12.0–46.0)
Lymphs Abs: 1.3 10*3/uL (ref 0.7–4.0)
MCHC: 33.2 g/dL (ref 30.0–36.0)
MCV: 82.2 fl (ref 78.0–100.0)
MONOS PCT: 9.7 % (ref 3.0–12.0)
Monocytes Absolute: 0.8 10*3/uL (ref 0.1–1.0)
Neutro Abs: 5.8 10*3/uL (ref 1.4–7.7)
Neutrophils Relative %: 72.4 % (ref 43.0–77.0)
Platelets: 342 10*3/uL (ref 150.0–400.0)
RBC: 4.32 Mil/uL (ref 3.87–5.11)
RDW: 14.3 % (ref 11.5–15.5)
WBC: 8 10*3/uL (ref 4.0–10.5)

## 2015-09-14 LAB — URINALYSIS, ROUTINE W REFLEX MICROSCOPIC
BILIRUBIN URINE: NEGATIVE
HGB URINE DIPSTICK: NEGATIVE
Ketones, ur: NEGATIVE
Nitrite: NEGATIVE
Specific Gravity, Urine: 1.02 (ref 1.000–1.030)
URINE GLUCOSE: NEGATIVE
UROBILINOGEN UA: 0.2 (ref 0.0–1.0)
pH: 6.5 (ref 5.0–8.0)

## 2015-09-14 LAB — HEPATIC FUNCTION PANEL
ALK PHOS: 83 U/L (ref 39–117)
ALT: 10 U/L (ref 0–35)
AST: 10 U/L (ref 0–37)
Albumin: 4.1 g/dL (ref 3.5–5.2)
BILIRUBIN DIRECT: 0.1 mg/dL (ref 0.0–0.3)
BILIRUBIN TOTAL: 0.4 mg/dL (ref 0.2–1.2)
TOTAL PROTEIN: 6.8 g/dL (ref 6.0–8.3)

## 2015-09-14 LAB — SEDIMENTATION RATE: Sed Rate: 23 mm/hr — ABNORMAL HIGH (ref 0–22)

## 2015-09-14 MED ORDER — PREDNISONE 10 MG PO TABS: ORAL_TABLET | ORAL | Status: DC

## 2015-09-14 MED ORDER — KETOROLAC TROMETHAMINE 60 MG/2ML IM SOLN
60.0000 mg | Freq: Once | INTRAMUSCULAR | Status: AC
  Administered 2015-09-14: 60 mg via INTRAMUSCULAR

## 2015-09-14 MED ORDER — LEVOFLOXACIN 500 MG PO TABS: 500.0000 mg | ORAL_TABLET | Freq: Every day | ORAL | Status: DC

## 2015-09-14 MED ORDER — VALACYCLOVIR HCL 1 G PO TABS: 1000.0000 mg | ORAL_TABLET | Freq: Three times a day (TID) | ORAL | Status: DC

## 2015-09-14 NOTE — Assessment & Plan Note (Addendum)
Abd [place] Tramadol prn Labs  Prednisone 10 mg: take 4 tabs a day x 3 days; then 3 tabs a day x 4 days; then 2 tabs a day x 4 days, then 1 tab a day x 6 days, then stop. Take pc.  Potential benefits of a steroid  use as well as potential risks  and complications were explained to the patient and were aknowledged.  Toradol IM now Hold Mobic  Start Valtrex if rash

## 2015-09-14 NOTE — Telephone Encounter (Signed)
Can she come today at 1:30? Thx

## 2015-09-14 NOTE — Progress Notes (Signed)
Pre visit review using our clinic review tool, if applicable. No additional management support is needed unless otherwise documented below in the visit note. 

## 2015-09-14 NOTE — Progress Notes (Signed)
Subjective:  Patient ID: [name], female    DOB: [birth year]  Age: 70 y.o. MRN: [id number]  CC: Back Pain   HPI [name] presents for LBP and shooting down R leg (had shots)  Since Thursday - pt couldn't move. Pt saw her NS and Dr Maryjean Ka this week - in bed x6 d due to L sided LBP - constant 7/10 - Dilaudid helped  Outpatient Prescriptions Prior to Visit  Medication Sig Dispense Refill  . albuterol (PROVENTIL HFA;VENTOLIN HFA) 108 (90 BASE) MCG/ACT inhaler Inhale 1 puff into the lungs 4 (four) times daily as needed for wheezing or shortness of breath.    . ALPRAZolam (XANAX) 0.5 MG tablet TAKE ONE TABLET AT BEDTIME AS NEEDED 30 tablet 3  . amLODipine-valsartan (EXFORGE) 5-160 MG tablet Take 1 tablet by mouth daily. 30 tablet 11  . ASACOL HD 800 MG TBEC TAKE 3 TABLETS TWICE DAILY 180 each 2  . buPROPion (WELLBUTRIN XL) 300 MG 24 hr tablet Take 1 tablet (300 mg total) by mouth daily. 90 tablet 1  . calcium citrate-vitamin D (CITRACAL+D) 315-200 MG-UNIT per tablet Take 1 tablet by mouth 2 (two) times daily.    . Chlorpheniramine Maleate (ALLERGY PO) Take 1 tablet by mouth daily.    . Cholecalciferol (VITAMIN D PO) Take 1 tablet by mouth daily.    . CONCERTA 36 MG CR tablet Take 36 mg by mouth daily.     . Cyanocobalamin (VITAMIN B-12 PO) Take 2 tablets by mouth daily.    [name] FLUoxetine (PROZAC) 20 MG capsule Take 60 mg by mouth daily.    [name] lamoTRIgine (LAMICTAL) 100 MG tablet Take 1 tablet by mouth 3 (three) times daily.    . meloxicam (MOBIC) 15 MG tablet Take 15 mg by mouth daily.     . methocarbamol (ROBAXIN) 500 MG tablet Take 1 tablet (500 mg total) by mouth 4 (four) times daily. 60 tablet 1  . Omega-3 Fatty Acids (OMEGA 3 PO) Take 1 capsule by mouth daily.    . ondansetron (ZOFRAN) 4 MG tablet Take 1 tablet (4 mg total) by mouth every 8 (eight) hours as needed for nausea or vomiting. 20 tablet 2  . pantoprazole (PROTONIX) 40 MG tablet TAKE ONE TABLET TWICE DAILY 60  tablet 3  . Probiotic Product (PROBIOTIC PO) Take 1 capsule by mouth daily.    [name] SYNTHROID 100 MCG tablet TAKE ONE TABLET EVERY MORNING 30 tablet 5  . SYNTHROID 50 MCG tablet TAKE TWO TABLETS IN THE MORNING 60 tablet 3  . traMADol (ULTRAM) 50 MG tablet Take 1-2 tablets (50-100 mg total) by mouth 2 (two) times daily as needed. 120 tablet 2   No facility-administered medications prior to visit.    ROS Review of Systems  Constitutional: Negative for chills, activity change, appetite change, fatigue and unexpected weight change.  HENT: Negative for congestion, mouth sores and sinus pressure.   Eyes: Negative for visual disturbance.  Respiratory: Negative for cough and chest tightness.   Gastrointestinal: Negative for nausea, abdominal pain and blood in stool.  Genitourinary: Negative for frequency, hematuria, decreased urine volume, difficulty urinating and vaginal pain.  Musculoskeletal: Positive for back pain, arthralgias and gait problem. Negative for neck stiffness.  Skin: Negative for pallor and rash.  Neurological: Negative for dizziness, tremors, weakness, light-headedness, numbness and headaches.  Psychiatric/Behavioral: Negative for confusion and sleep disturbance.    Objective:  BP 138/82 mmHg  Pulse 87  Temp(Src) 98.4 F (36.9 C) (Oral)  Ht 5' 5"  (1.651 m)  Wt 152 lb (68.947 kg)  BMI 25.29 kg/m2  SpO2 95%  BP Readings from Last 3 Encounters:  09/14/15 138/82  08/30/15 120/70  08/08/15 138/74    Wt Readings from Last 3 Encounters:  09/14/15 152 lb (68.947 kg)  08/30/15 151 lb (68.493 kg)  08/08/15 152 lb (68.947 kg)    Physical Exam  Constitutional: She appears well-developed. No distress.  HENT:  Head: Normocephalic.  Right Ear: External ear normal.  Left Ear: External ear normal.  Nose: Nose normal.  Mouth/Throat: Oropharynx is clear and moist.  Eyes: Conjunctivae are normal. Pupils are equal, round, and reactive to light. Right eye exhibits no discharge.  Left eye exhibits no discharge.  Neck: Normal range of motion. Neck supple. No JVD present. No tracheal deviation present. No thyromegaly present.  Cardiovascular: Normal rate, regular rhythm and normal heart sounds.   Pulmonary/Chest: No stridor. No respiratory distress. She has no wheezes.  Abdominal: Soft. Bowel sounds are normal. She exhibits no distension and no mass. There is no tenderness. There is no rebound and no guarding.  Musculoskeletal: She exhibits tenderness. She exhibits no edema.  Lymphadenopathy:    She has no cervical adenopathy.  Neurological: She displays normal reflexes. No cranial nerve deficit. She exhibits normal muscle tone. Coordination abnormal.  Skin: No rash noted. No erythema.  Psychiatric: She has a normal mood and affect. Her behavior is normal. Judgment and thought content normal.  NAD Cane LS tender w/ROM Str leg elev (-) B   Lab Results  Component Value Date   WBC 9.5 05/05/2015   HGB 13.0 05/05/2015   HCT 40.6 05/05/2015   PLT 327.0 05/05/2015   GLUCOSE 81 05/05/2015   CHOL 259* 03/30/2014   TRIG 177.0* 03/30/2014   HDL 47.40 03/30/2014   LDLDIRECT 182.5 02/02/2008   LDLCALC 176* 03/30/2014   ALT 12 05/05/2015   AST 11 05/05/2015   NA 141 05/05/2015   K 4.5 05/05/2015   CL 103 05/05/2015   CREATININE 0.82 05/05/2015   BUN 20 05/05/2015   CO2 30 05/05/2015   TSH 0.77 05/05/2015   INR 1.0 11/25/2008   HGBA1C 5.5 04/24/2012    Mr Brain Wo Contrast  08/03/2015  [facility] NEUROIMAGING REPORT STUDY DATE: 08/01/15 PATIENT NAME: [name] DOB: December 12, 1945 MRN: [id number] ORDERING CLINICIAN: Andrey Spearman, MD CLINICAL HISTORY: 70 year old female with confusion and memory loss. EXAM: MRI brain (without) TECHNIQUE: MRI of the brain without contrast was obtained utilizing 5 mm axial slices with T1, T2, T2 flair, SWI and diffusion weighted views.  T1 sagittal and T2 coronal views were obtained. CONTRAST: no IMAGING SITE:  [facility] [address] (1.5 Tesla MRI)  FINDINGS: No abnormal lesions are seen on diffusion-weighted views to suggest acute ischemia. The cortical sulci, fissures and cisterns are notable for mild perisylvian atrophy. Lateral, third and fourth ventricle are normal in size and appearance. No extra-axial fluid collections are seen. No evidence of mass effect or midline shift.  Mild periventricular and subcortical and pontine chronic small vessel ischemic disease. On sagittal views the posterior fossa, pituitary gland and corpus callosum are unremarkable. No evidence of intracranial hemorrhage on SWI views. The orbits and their contents, paranasal sinuses and calvarium are unremarkable. Non-specific fluid/inflammation in the mastoid air cells. Stable subcutaneous sebaceous cyst noted in the left posterior occipital region. Intracranial flow voids are present.   08/03/2015  Abnormal MRI brain (without) demonstrating: 1. Mild periventricular and  subcortical and pontine chronic small vessel ischemic disease. 2. No acute findings. 3. No significant change from MRI on 03/25/11. INTERPRETING PHYSICIAN: Penni Bombard, MD Certified in Neurology, Neurophysiology and Neuroimaging [facility] [address], [place], [place] [address] [phone]    Assessment & Plan:   There are no diagnoses linked to this encounter. I am having [name] maintain her FLUoxetine, albuterol, Omega-3 Fatty Acids (OMEGA 3 PO), Probiotic Product (PROBIOTIC PO), Cholecalciferol (VITAMIN D PO), Cyanocobalamin (VITAMIN B-12 PO), calcium citrate-vitamin D, Chlorpheniramine Maleate (ALLERGY PO), ASACOL HD, buPROPion, CONCERTA, ALPRAZolam, SYNTHROID, meloxicam, lamoTRIgine, methocarbamol, amLODipine-valsartan, ondansetron, pantoprazole, SYNTHROID, and traMADol.  No orders of the defined types were placed in this encounter.     Follow-up: No Follow-up on file.  Walker Kehr, MD

## 2015-09-14 NOTE — Telephone Encounter (Signed)
appt made

## 2015-09-14 NOTE — Assessment & Plan Note (Signed)
?  etiology  Abd [place] Tramadol prn Labs  Prednisone 10 mg: take 4 tabs a day x 3 days; then 3 tabs a day x 4 days; then 2 tabs a day x 4 days, then 1 tab a day x 6 days, then stop. Take pc.  Potential benefits of a steroid  use as well as potential risks  and complications were explained to the patient and were aknowledged.  Toradol IM now Hold Mobic  Start Valtrex if rash

## 2015-09-14 NOTE — Telephone Encounter (Signed)
Pt just received her msg from [facility] and she is unable to make these appts.  Want me to work her in tomorrow?

## 2015-09-15 LAB — VITAMIN D 25 HYDROXY (VIT D DEFICIENCY, FRACTURES): Vit D, 25-Hydroxy: 51 ng/mL (ref 30–100)

## 2015-09-19 DIAGNOSIS — M25551 Pain in right hip: Secondary | ICD-10-CM | POA: Diagnosis not present

## 2015-09-25 DIAGNOSIS — M4120 Other idiopathic scoliosis, site unspecified: Secondary | ICD-10-CM | POA: Diagnosis not present

## 2015-09-25 DIAGNOSIS — M47816 Spondylosis without myelopathy or radiculopathy, lumbar region: Secondary | ICD-10-CM | POA: Diagnosis not present

## 2015-09-25 DIAGNOSIS — M5416 Radiculopathy, lumbar region: Secondary | ICD-10-CM | POA: Diagnosis not present

## 2015-09-25 DIAGNOSIS — M545 Low back pain: Secondary | ICD-10-CM | POA: Diagnosis not present

## 2015-09-25 DIAGNOSIS — M4806 Spinal stenosis, lumbar region: Secondary | ICD-10-CM | POA: Diagnosis not present

## 2015-09-26 DIAGNOSIS — M25551 Pain in right hip: Secondary | ICD-10-CM | POA: Diagnosis not present

## 2015-09-27 ENCOUNTER — Other Ambulatory Visit: Payer: Medicare Other

## 2015-09-28 DIAGNOSIS — M25551 Pain in right hip: Secondary | ICD-10-CM | POA: Diagnosis not present

## 2015-10-19 DIAGNOSIS — M25551 Pain in right hip: Secondary | ICD-10-CM | POA: Diagnosis not present

## 2015-10-23 DIAGNOSIS — N281 Cyst of kidney, acquired: Secondary | ICD-10-CM

## 2015-10-23 HISTORY — DX: Cyst of kidney, acquired: N28.1

## 2015-10-24 DIAGNOSIS — M25551 Pain in right hip: Secondary | ICD-10-CM | POA: Diagnosis not present

## 2015-10-26 DIAGNOSIS — M25551 Pain in right hip: Secondary | ICD-10-CM | POA: Diagnosis not present

## 2015-10-27 ENCOUNTER — Ambulatory Visit
Admission: RE | Admit: 2015-10-27 | Discharge: 2015-10-27 | Disposition: A | Discharge status: Home / Self Care | Payer: Medicare Other | Source: Ambulatory Visit | Attending: Internal Medicine | Admitting: Internal Medicine

## 2015-10-27 DIAGNOSIS — N281 Cyst of kidney, acquired: Secondary | ICD-10-CM | POA: Diagnosis not present

## 2015-10-31 DIAGNOSIS — M25551 Pain in right hip: Secondary | ICD-10-CM | POA: Diagnosis not present

## 2015-11-02 DIAGNOSIS — M545 Low back pain: Secondary | ICD-10-CM | POA: Diagnosis not present

## 2015-11-02 DIAGNOSIS — M5416 Radiculopathy, lumbar region: Secondary | ICD-10-CM | POA: Diagnosis not present

## 2015-11-03 DIAGNOSIS — M25551 Pain in right hip: Secondary | ICD-10-CM | POA: Diagnosis not present

## 2015-11-06 DIAGNOSIS — M25551 Pain in right hip: Secondary | ICD-10-CM | POA: Diagnosis not present

## 2015-11-07 DIAGNOSIS — F900 Attention-deficit hyperactivity disorder, predominantly inattentive type: Secondary | ICD-10-CM | POA: Diagnosis not present

## 2015-11-07 DIAGNOSIS — F39 Unspecified mood [affective] disorder: Secondary | ICD-10-CM | POA: Diagnosis not present

## 2015-11-08 DIAGNOSIS — M0760 Enteropathic arthropathies, unspecified site: Secondary | ICD-10-CM | POA: Diagnosis not present

## 2015-11-08 DIAGNOSIS — K639 Disease of intestine, unspecified: Secondary | ICD-10-CM | POA: Diagnosis not present

## 2015-11-08 DIAGNOSIS — M25569 Pain in unspecified knee: Secondary | ICD-10-CM | POA: Diagnosis not present

## 2015-11-08 DIAGNOSIS — M25559 Pain in unspecified hip: Secondary | ICD-10-CM | POA: Diagnosis not present

## 2015-11-21 DIAGNOSIS — K5190 Ulcerative colitis, unspecified, without complications: Secondary | ICD-10-CM | POA: Diagnosis not present

## 2015-11-21 DIAGNOSIS — Z79899 Other long term (current) drug therapy: Secondary | ICD-10-CM | POA: Diagnosis not present

## 2015-11-21 DIAGNOSIS — K529 Noninfective gastroenteritis and colitis, unspecified: Secondary | ICD-10-CM | POA: Diagnosis not present

## 2015-11-21 DIAGNOSIS — R5383 Other fatigue: Secondary | ICD-10-CM | POA: Diagnosis not present

## 2015-11-29 ENCOUNTER — Encounter (HOSPITAL_COMMUNITY): Payer: Self-pay

## 2015-11-29 ENCOUNTER — Emergency Department (HOSPITAL_COMMUNITY)
Admission: EM | Admit: 2015-11-29 | Discharge: 2015-11-30 | Disposition: A | Discharge status: Home / Self Care | Payer: Medicare Other | Attending: Emergency Medicine | Admitting: Emergency Medicine

## 2015-11-29 DIAGNOSIS — I10 Essential (primary) hypertension: Secondary | ICD-10-CM | POA: Insufficient documentation

## 2015-11-29 DIAGNOSIS — Z87891 Personal history of nicotine dependence: Secondary | ICD-10-CM | POA: Diagnosis not present

## 2015-11-29 DIAGNOSIS — N390 Urinary tract infection, site not specified: Secondary | ICD-10-CM | POA: Insufficient documentation

## 2015-11-29 DIAGNOSIS — J45909 Unspecified asthma, uncomplicated: Secondary | ICD-10-CM | POA: Insufficient documentation

## 2015-11-29 DIAGNOSIS — Z96619 Presence of unspecified artificial shoulder joint: Secondary | ICD-10-CM | POA: Diagnosis not present

## 2015-11-29 DIAGNOSIS — R109 Unspecified abdominal pain: Secondary | ICD-10-CM | POA: Diagnosis present

## 2015-11-29 DIAGNOSIS — F329 Major depressive disorder, single episode, unspecified: Secondary | ICD-10-CM | POA: Diagnosis not present

## 2015-11-29 DIAGNOSIS — Z79899 Other long term (current) drug therapy: Secondary | ICD-10-CM | POA: Diagnosis not present

## 2015-11-29 LAB — URINALYSIS, ROUTINE W REFLEX MICROSCOPIC
BILIRUBIN URINE: NEGATIVE
GLUCOSE, UA: NEGATIVE mg/dL
Hgb urine dipstick: NEGATIVE
KETONES UR: NEGATIVE mg/dL
NITRITE: NEGATIVE
PROTEIN: NEGATIVE mg/dL
Specific Gravity, Urine: 1.021 (ref 1.005–1.030)
pH: 6 (ref 5.0–8.0)

## 2015-11-29 LAB — COMPREHENSIVE METABOLIC PANEL
ALT: 13 U/L — AB (ref 14–54)
ANION GAP: 5 (ref 5–15)
AST: 15 U/L (ref 15–41)
Albumin: 3.8 g/dL (ref 3.5–5.0)
Alkaline Phosphatase: 76 U/L (ref 38–126)
BUN: 9 mg/dL (ref 6–20)
CHLORIDE: 105 mmol/L (ref 101–111)
CO2: 29 mmol/L (ref 22–32)
CREATININE: 0.7 mg/dL (ref 0.44–1.00)
Calcium: 8.9 mg/dL (ref 8.9–10.3)
GFR calc non Af Amer: >60 mL/min (ref >60)
Glucose, Bld: 94 mg/dL (ref 65–99)
POTASSIUM: 3.7 mmol/L (ref 3.5–5.1)
SODIUM: 139 mmol/L (ref 135–145)
Total Bilirubin: 0.6 mg/dL (ref 0.3–1.2)
Total Protein: 6 g/dL — ABNORMAL LOW (ref 6.5–8.1)

## 2015-11-29 LAB — CBC WITH DIFFERENTIAL/PLATELET
Basophils Absolute: 0 10*3/uL (ref 0.0–0.1)
Basophils Relative: 0 %
EOS ABS: 0.2 10*3/uL (ref 0.0–0.7)
EOS PCT: 2 %
HCT: 37 % (ref 36.0–46.0)
Hemoglobin: 11.4 g/dL — ABNORMAL LOW (ref 12.0–15.0)
LYMPHS ABS: 1.6 10*3/uL (ref 0.7–4.0)
Lymphocytes Relative: 15 %
MCH: 25.6 pg — AB (ref 26.0–34.0)
MCHC: 30.8 g/dL (ref 30.0–36.0)
MCV: 83 fL (ref 78.0–100.0)
MONO ABS: 0.9 10*3/uL (ref 0.1–1.0)
Monocytes Relative: 9 %
Neutro Abs: 7.9 10*3/uL — ABNORMAL HIGH (ref 1.7–7.7)
Neutrophils Relative %: 74 %
PLATELETS: 242 10*3/uL (ref 150–400)
RBC: 4.46 MIL/uL (ref 3.87–5.11)
RDW: 15.3 % (ref 11.5–15.5)
WBC: 10.6 10*3/uL — AB (ref 4.0–10.5)

## 2015-11-29 LAB — URINE MICROSCOPIC-ADD ON

## 2015-11-29 LAB — I-STAT CG4 LACTIC ACID, ED: LACTIC ACID, VENOUS: 0.61 mmol/L (ref 0.5–2.0)

## 2015-11-29 MED ORDER — SODIUM CHLORIDE 0.9 % IV BOLUS (SEPSIS)
1000.0000 mL | Freq: Once | INTRAVENOUS | Status: AC
  Administered 2015-11-29: 1000 mL via INTRAVENOUS

## 2015-11-29 MED ORDER — DEXTROSE 5 % IV SOLN
1.0000 g | Freq: Once | INTRAVENOUS | Status: AC
  Administered 2015-11-29: 1 g via INTRAVENOUS
  Filled 2015-11-29: qty 10

## 2015-11-29 MED ORDER — ACETAMINOPHEN 325 MG PO TABS
650.0000 mg | ORAL_TABLET | Freq: Once | ORAL | Status: AC
  Administered 2015-11-29: 650 mg via ORAL
  Filled 2015-11-29: qty 2

## 2015-11-29 NOTE — ED Provider Notes (Signed)
CSN: [id number]     Arrival date & time 11/29/15  1611 History   First MD Initiated Contact with Patient 11/29/15 1738     Chief Complaint  Patient presents with  . Flank Pain     (Consider location/radiation/quality/duration/timing/severity/associated sxs/prior Treatment) HPI   Patient is a 70 year old female with a history of IBS, UC, HTN, colon polyps who presents the ED with 2 days of worsening bilateral flank pain. Patient states the pain started yesterday with associated body aches, fatigue, and increased urinary frequency. Patient states the pain is constant, dull/achy, radiating into her abdomen, pain is worse with movement, lasting, rising from seated position and the pain is sharp, nonradiating, 9/10. Patient took Dilaudid this morning and 8a.m. and again at 3pm with minimal relief. Patient states in March of this year she had pyelonephritis and this pain feels similar. Pt has some urinary incontinence at baseline. She denies trauma, chest pain, shortness of breath, dysuria, hematuria, nausea, vomiting, diarrhea, abdominal pain.   Past Medical History  Diagnosis Date  . Anxiety   . Depression   . Thyroid disease   . Allergy   . GERD (gastroesophageal reflux disease)   . IBS (irritable bowel syndrome)   . Ulcerative colitis   . Attention deficit disorder without mention of hyperactivity   . Unspecified asthma(493.90)   . Osteoarthrosis, unspecified whether generalized or localized, unspecified site   . Other and unspecified hyperlipidemia   . Diverticulosis   . Colitis 12/05/2010  . Unspecified essential hypertension     taken off meds since lost wt  . H/O hiatal hernia   . Hypertension   . Colon polyps    Past Surgical History  Procedure Laterality Date  . Colonoscopy    . Upper gastrointestinal endoscopy    . Back surgery      fluid removed from between disks  . Shoulder surgery      right shoulder 3x   . Total shoulder arthroplasty    . Toe surgery      right  big toe  . Breast surgery      bil breast reduction   . Hip fracture surgery      left  11/01/2013  . Cholecystectomy      Laparoscopic cholecystectomy  . Anterior lumbar fusion N/A 04/22/2014    Procedure: Lumbar five/-Sacral one. Anterior lumbar interbody fusion with Dr. Scot Dock; Left Lumbar one/two, two/three/three/four, four/five. Anterior lateral lumbar interbody fusion**Stage 1**;  Surgeon: Erline Levine, MD;  Location: [place] [facility];  Service: Neurosurgery;  Laterality: N/A;  . Anterior lateral lumbar fusion 4 levels Left 04/22/2014    Procedure: Left Lumbar one/two, two/three, three/four, four,five. Anterior lateral lumbar interbody fusion**Stage 1**;  Surgeon: Erline Levine, MD;  Location: [place] [facility];  Service: Neurosurgery;  Laterality: Left;  . Abdominal exposure N/A 04/22/2014    Procedure: ABDOMINAL EXPOSURE;  Surgeon: Angelia Mould, MD;  Location: [place] [facility];  Service: Vascular;  Laterality: N/A;  . Lumbar percutaneous pedicle screw 4 level N/A 04/25/2014    Procedure: **Stage 2** Percutaneous pedicle screw placement L1-5;  Surgeon: Erline Levine, MD;  Location: [place] [facility];  Service: Neurosurgery;  Laterality: N/A;  **Stage 2** Percutaneous pedicle screw placement L1-5   Family History  Problem Relation Age of Onset  . Hypertension Father   . Colon cancer Father 24  . Arthritis Father   . Heart disease Father     CHF  . Diabetes Neg Hx   . Heart disease Mother  Died, 91  . Healthy Brother   . Healthy Daughter    Social History  Substance Use Topics  . Smoking status: Former Smoker    Quit date: 10/30/1978  . Smokeless tobacco: Never Used  . Alcohol Use: 0.5 oz/week    1 Standard drinks or equivalent per week     Comment: once a week   OB History    No data available     Review of Systems  Constitutional: Negative for fever and chills.  Eyes: Negative for visual disturbance.  Respiratory: Negative for cough and shortness of breath.    Cardiovascular: Negative for chest pain and leg swelling.  Gastrointestinal: Negative for nausea, vomiting, abdominal pain, diarrhea and abdominal distention.  Genitourinary: Positive for frequency. Negative for dysuria, hematuria, vaginal bleeding and vaginal discharge.  Musculoskeletal: Positive for myalgias and back pain. Negative for neck pain.  Skin: Negative for rash.  Neurological: Negative for dizziness, syncope, weakness and light-headedness.  Psychiatric/Behavioral: Negative for confusion.      Allergies  Ampicillin; Erythromycin; Hctz; Metoprolol; and Morphine and related  Home Medications   Prior to Admission medications   Medication Sig Start Date End Date Taking? Authorizing Provider  albuterol (PROVENTIL HFA;VENTOLIN HFA) 108 (90 BASE) MCG/ACT inhaler Inhale 1 puff into the lungs 4 (four) times daily as needed for wheezing or shortness of breath.   Yes Historical Provider, MD  ALPRAZolam Duanne Moron) 0.5 MG tablet TAKE ONE TABLET AT BEDTIME AS NEEDED 01/16/15  Yes Hoyt Koch, MD  amLODipine-valsartan (EXFORGE) 5-160 MG tablet Take 1 tablet by mouth daily. 07/31/15  Yes Aleksei Plotnikov V, MD  ASACOL HD 800 MG TBEC TAKE 3 TABLETS TWICE DAILY 11/25/14  Yes Irene Shipper, MD  buPROPion (WELLBUTRIN XL) 300 MG 24 hr tablet Take 1 tablet (300 mg total) by mouth daily. 12/22/14  Yes Aleksei Plotnikov V, MD  Cholecalciferol (VITAMIN D PO) Take 1 tablet by mouth daily.   Yes Historical Provider, MD  CONCERTA 36 MG CR tablet Take 36 mg by mouth daily.  12/06/14  Yes Historical Provider, MD  Cyanocobalamin (B-12 PO) Take 1 tablet by mouth daily.   Yes Historical Provider, MD  Cyanocobalamin (VITAMIN B-12 PO) Take 2 tablets by mouth daily.   Yes Historical Provider, MD  FLUoxetine (PROZAC) 20 MG capsule Take 60 mg by mouth daily.   Yes Historical Provider, MD  HYDROmorphone (DILAUDID) 4 MG tablet Take 4 mg by mouth 2 (two) times daily as needed for moderate pain.  09/07/15  Yes  Historical Provider, MD  ibuprofen (ADVIL,MOTRIN) 200 MG tablet Take 800 mg by mouth daily as needed for moderate pain.   Yes Historical Provider, MD  lamoTRIgine (LAMICTAL) 100 MG tablet Take 1 tablet by mouth 3 (three) times daily. 05/31/15  Yes Historical Provider, MD  loratadine (CLARITIN) 10 MG tablet Take 10 mg by mouth daily.   Yes Historical Provider, MD  Melatonin 5 MG TABS Take 10 mg by mouth at bedtime.   Yes Historical Provider, MD  meloxicam (MOBIC) 15 MG tablet Take 15 mg by mouth daily.  04/24/15  Yes Historical Provider, MD  methocarbamol (ROBAXIN) 500 MG tablet Take 1 tablet (500 mg total) by mouth 4 (four) times daily. Patient taking differently: Take 500 mg by mouth 2 (two) times daily.  06/30/15  Yes Aleksei Plotnikov V, MD  ondansetron (ZOFRAN) 4 MG tablet Take 1 tablet (4 mg total) by mouth every 8 (eight) hours as needed for nausea or vomiting. 07/31/15  Yes Aleksei  Plotnikov V, MD  pantoprazole (PROTONIX) 40 MG tablet TAKE ONE TABLET TWICE DAILY 08/16/15  Yes Hoyt Koch, MD  Probiotic Product (PROBIOTIC PO) Take 1 capsule by mouth daily.   Yes Historical Provider, MD  SYNTHROID 50 MCG tablet TAKE TWO TABLETS IN THE MORNING Patient taking differently: take 179mcg at bedtime 08/16/15  Yes Hoyt Koch, MD  traMADol (ULTRAM) 50 MG tablet Take 1-2 tablets (50-100 mg total) by mouth 2 (two) times daily as needed. 09/07/15  Yes Aleksei Plotnikov V, MD  ciprofloxacin (CIPRO) 500 MG tablet Take 1 tablet (500 mg total) by mouth 2 (two) times daily. 11/30/15   Kalman Drape, PA  levofloxacin (LEVAQUIN) 500 MG tablet Take 1 tablet (500 mg total) by mouth daily. Patient not taking: Reported on 11/29/2015 09/14/15   Aleksei Plotnikov V, MD  predniSONE (DELTASONE) 10 MG tablet Prednisone 10 mg: take 4 tabs a day x 3 days; then 3 tabs a day x 4 days; then 2 tabs a day x 4 days, then 1 tab a day x 6 days, then stop. Take pc. Patient not taking: Reported on 11/29/2015 09/14/15   Cassandria Anger, MD  valACYclovir (VALTREX) 1000 MG tablet Take 1 tablet (1,000 mg total) by mouth 3 (three) times daily. Patient not taking: Reported on 11/29/2015 09/14/15   Aleksei Plotnikov V, MD   BP 140/77 mmHg  Pulse 73  Temp(Src) 98.2 F (36.8 C) (Oral)  Resp 14  SpO2 96% Physical Exam  Constitutional: She appears well-developed and well-nourished. No distress.  HENT:  Head: Normocephalic and atraumatic.  Eyes: Conjunctivae are normal.  Neck: Normal range of motion.  Cardiovascular: Normal rate, regular rhythm and normal heart sounds.  Exam reveals no gallop and no friction rub.   No murmur heard. Pulmonary/Chest: Effort normal and breath sounds normal. No respiratory distress. She has no wheezes. She has no rales.  Abdominal: Normal appearance and bowel sounds are normal. She exhibits no distension. There is CVA tenderness. There is no rigidity, no rebound and no guarding.  Generalized TTP, bilateral CVA tenderness, TTP of bilateral flank regions  Musculoskeletal: Normal range of motion. She exhibits no edema.  No lumbar spinal or paraspinal tenderness  Neurological: She is alert. Coordination normal.  Skin: Skin is warm and dry. No rash noted. She is not diaphoretic.  Psychiatric: She has a normal mood and affect. Her behavior is normal.  Nursing note and vitals reviewed.   ED Course  Procedures (including critical care time) Labs Review Labs Reviewed  URINALYSIS, ROUTINE W REFLEX MICROSCOPIC (NOT AT [facility]) - Abnormal; Notable for the following:    APPearance CLOUDY (*)    Leukocytes, UA MODERATE (*)    All other components within normal limits  CBC WITH DIFFERENTIAL/PLATELET - Abnormal; Notable for the following:    WBC 10.6 (*)    Hemoglobin 11.4 (*)    MCH 25.6 (*)    Neutro Abs 7.9 (*)    All other components within normal limits  COMPREHENSIVE METABOLIC PANEL - Abnormal; Notable for the following:    Total Protein 6.0 (*)    ALT 13 (*)    All other components  within normal limits  URINE MICROSCOPIC-ADD ON - Abnormal; Notable for the following:    Squamous Epithelial / LPF 6-30 (*)    Bacteria, UA FEW (*)    All other components within normal limits  URINE CULTURE  I-STAT CG4 LACTIC ACID, ED    Imaging Review No results found. I  have personally reviewed and evaluated these images and lab results as part of my medical decision-making.   EKG Interpretation None      MDM   Final diagnoses:  UTI (lower urinary tract infection)   Patient with UTI likely pyelonephritis. Patient is afebrile, stable, VSS. UA revealed signs of UTI. WBC is elevated to 10.6. LABS are unremarkable. Pt states the pain is the same as in March when she was treated for pyelonephritis along with increased urinary frequency. Patient was given 1 g of Rocephin while in the ED. She was discharged with ciprofloxacin. She was instructed to follow-up with her primary care provider within 2 days to be reevaluated. Pt ambulatory and able to tolerate PO in the ED. Discussed strict return precautions. Pt expressed understanding to the discharge instructions.     Kalman Drape, [place] 11/30/15 1511  Tanna Furry, MD 12/15/15 [phone]

## 2015-11-29 NOTE — ED Notes (Addendum)
Per EMS,  Pt, from home, c/o bilateral pain starting last night.  Pain score 7/10.  Pt reports UTI around 1 month ago, but denies GU symptoms to EMS.    Pt reports pain increases w/ movement and c/o urinary frequency.  Hx of back and hip surgery.  Denies new injury.

## 2015-11-29 NOTE — ED Notes (Signed)
Patient on bedpan attempting to provide urine sample.

## 2015-11-30 ENCOUNTER — Encounter: Payer: Self-pay | Admitting: Family

## 2015-11-30 ENCOUNTER — Ambulatory Visit (INDEPENDENT_AMBULATORY_CARE_PROVIDER_SITE_OTHER): Payer: Medicare Other | Admitting: Family

## 2015-11-30 VITALS — BP 130/74 | HR 75 | Temp 98.2°F | Resp 16 | Ht 65.0 in | Wt 166.0 lb

## 2015-11-30 DIAGNOSIS — N10 Acute tubulo-interstitial nephritis: Secondary | ICD-10-CM

## 2015-11-30 DIAGNOSIS — N390 Urinary tract infection, site not specified: Secondary | ICD-10-CM | POA: Insufficient documentation

## 2015-11-30 MED ORDER — CIPROFLOXACIN HCL 500 MG PO TABS: 500.0000 mg | ORAL_TABLET | Freq: Two times a day (BID) | ORAL | Status: DC

## 2015-11-30 NOTE — Discharge Instructions (Signed)
Follow up with your primary care provider within 2 days to be reevaluated.  Take the Cipro as prescribed. Complete the entire course of antibiotics.  Return to the Emergency Department if you experience worsening flank pain, bloody urine, fever, chills, confusion, nausea, vomiting.

## 2015-11-30 NOTE — Assessment & Plan Note (Signed)
UTI with concern for pyelonephritis and treated in the emergency department with urine culture pending. Started Cipro less than 12 hours ago. Continue current dosage of ciprofloxacin pending urine culture results. Continue previously prescribed pain medication as needed. Recommend stool softener with narcotic medication. Follow-up if symptoms worsen or fevers develop.

## 2015-11-30 NOTE — Progress Notes (Signed)
Subjective:    Patient ID: [name], female    DOB: Aug 10, 1945, 70 y.o.   MRN: [id number]  Chief Complaint  Patient presents with  . Urinary Tract Infection    was diagnosed with UTI at the hospital last night was given an antibiotic and states that she has stabbing pain in her lower back and nothing is helping the pain    HPI:  [name] is a 70 y.o. female who  has a past medical history of Anxiety; Depression; Thyroid disease; Allergy; GERD (gastroesophageal reflux disease); IBS (irritable bowel syndrome); Ulcerative colitis; Attention deficit disorder without mention of hyperactivity; Unspecified asthma(493.90); Osteoarthrosis, unspecified whether generalized or localized, unspecified site; Other and unspecified hyperlipidemia; Diverticulosis; Colitis (12/05/2010); Unspecified essential hypertension; H/O hiatal hernia; Hypertension; and Colon polyps. and presents today for an acute office visit.   Recently evaluated in the emergency department for 2 days of worsening bilateral flank pain that started a day prior to presentation. Describes additional body aches, fatigue, and increased urinary frequency. Pain is described as constant, dull/achy, and radiating into her abdomen. She reports taking Dilaudid multiple times with minimal relief. She was noted to have pyelonephritis in March 2017 and describes the pain is similar. Her abdomen had generalized tenderness to palpation and bilateral CVA tenderness and flank tenderness. No lumbar spinal or paraspinal tenderness was noted. Diagnosed with UTI likely pyelonephritis and noted to be stable and afebrile. UA revealed signs of urinary tract infection with white blood cell count being elevated to 10.6. Patient was given 1 g of Rocephin while in the emergency department and discharged with ciprofloxacin. She is instructed to follow-up with primary care in 2 days to be reevaluated. Patient was ambulatory and able to tolerate oral intake.  All ED records and labs were reviewed in detail.   She started taking the cirpofloxacin this morning. Denies any fevers today. States she has been in bed all day today. States that she is not taking any pain medication currently.   Allergies  Allergen Reactions  . Ampicillin Nausea And Vomiting  . Erythromycin Nausea And Vomiting  . Hctz [Hydrochlorothiazide]     dizzy  . Metoprolol     doublevision   . Morphine And Related Nausea And Vomiting     Current Outpatient Prescriptions on File Prior to Visit  Medication Sig Dispense Refill  . albuterol (PROVENTIL HFA;VENTOLIN HFA) 108 (90 BASE) MCG/ACT inhaler Inhale 1 puff into the lungs 4 (four) times daily as needed for wheezing or shortness of breath.    . ALPRAZolam (XANAX) 0.5 MG tablet TAKE ONE TABLET AT BEDTIME AS NEEDED 30 tablet 3  . amLODipine-valsartan (EXFORGE) 5-160 MG tablet Take 1 tablet by mouth daily. 30 tablet 11  . ASACOL HD 800 MG TBEC TAKE 3 TABLETS TWICE DAILY 180 each 2  . buPROPion (WELLBUTRIN XL) 300 MG 24 hr tablet Take 1 tablet (300 mg total) by mouth daily. 90 tablet 1  . Cholecalciferol (VITAMIN D PO) Take 1 tablet by mouth daily.    . ciprofloxacin (CIPRO) 500 MG tablet Take 1 tablet (500 mg total) by mouth 2 (two) times daily. 28 tablet 0  . CONCERTA 36 MG CR tablet Take 36 mg by mouth daily.     . Cyanocobalamin (B-12 PO) Take 1 tablet by mouth daily.    . Cyanocobalamin (VITAMIN B-12 PO) Take 2 tablets by mouth daily.    [name] FLUoxetine (PROZAC) 20 MG capsule Take 60 mg by mouth daily.    [name]  HYDROmorphone (DILAUDID) 4 MG tablet Take 4 mg by mouth 2 (two) times daily as needed for moderate pain.     [name] ibuprofen (ADVIL,MOTRIN) 200 MG tablet Take 800 mg by mouth daily as needed for moderate pain.    [name] lamoTRIgine (LAMICTAL) 100 MG tablet Take 1 tablet by mouth 3 (three) times daily.    [name] levofloxacin (LEVAQUIN) 500 MG tablet Take 1 tablet (500 mg total) by mouth daily. 10 tablet 0  . loratadine (CLARITIN) 10 MG  tablet Take 10 mg by mouth daily.    . Melatonin 5 MG TABS Take 10 mg by mouth at bedtime.    . meloxicam (MOBIC) 15 MG tablet Take 15 mg by mouth daily.     . methocarbamol (ROBAXIN) 500 MG tablet Take 1 tablet (500 mg total) by mouth 4 (four) times daily. (Patient taking differently: Take 500 mg by mouth 2 (two) times daily. ) 60 tablet 1  . ondansetron (ZOFRAN) 4 MG tablet Take 1 tablet (4 mg total) by mouth every 8 (eight) hours as needed for nausea or vomiting. 20 tablet 2  . pantoprazole (PROTONIX) 40 MG tablet TAKE ONE TABLET TWICE DAILY 60 tablet 3  . Probiotic Product (PROBIOTIC PO) Take 1 capsule by mouth daily.    [name] SYNTHROID 50 MCG tablet TAKE TWO TABLETS IN THE MORNING (Patient taking differently: take 186mcg at bedtime) 60 tablet 3  . traMADol (ULTRAM) 50 MG tablet Take 1-2 tablets (50-100 mg total) by mouth 2 (two) times daily as needed. 120 tablet 2  . valACYclovir (VALTREX) 1000 MG tablet Take 1 tablet (1,000 mg total) by mouth 3 (three) times daily. 21 tablet 0  . [DISCONTINUED] losartan-hydrochlorothiazide (HYZAAR) 50-12.5 MG per tablet Take 1 tablet by mouth daily.    . [DISCONTINUED] simvastatin (ZOCOR) 20 MG tablet Take 20 mg by mouth every evening.     No current facility-administered medications on file prior to visit.     Past Surgical History  Procedure Laterality Date  . Colonoscopy    . Upper gastrointestinal endoscopy    . Back surgery      fluid removed from between disks  . Shoulder surgery      right shoulder 3x   . Total shoulder arthroplasty    . Toe surgery      right big toe  . Breast surgery      bil breast reduction   . Hip fracture surgery      left  11/01/2013  . Cholecystectomy      Laparoscopic cholecystectomy  . Anterior lumbar fusion N/A 04/22/2014    Procedure: Lumbar five/-Sacral one. Anterior lumbar interbody fusion with Dr. Scot Dock; Left Lumbar one/two, two/three/three/four, four/five. Anterior lateral lumbar interbody fusion**Stage  1**;  Surgeon: Erline Levine, MD;  Location: [facility];  Service: Neurosurgery;  Laterality: N/A;  . Anterior lateral lumbar fusion 4 levels Left 04/22/2014    Procedure: Left Lumbar one/two, two/three, three/four, four,five. Anterior lateral lumbar interbody fusion**Stage 1**;  Surgeon: Erline Levine, MD;  Location: [facility];  Service: Neurosurgery;  Laterality: Left;  . Abdominal exposure N/A 04/22/2014    Procedure: ABDOMINAL EXPOSURE;  Surgeon: Angelia Mould, MD;  Location: [facility];  Service: Vascular;  Laterality: N/A;  . Lumbar percutaneous pedicle screw 4 level N/A 04/25/2014    Procedure: **Stage 2** Percutaneous pedicle screw placement L1-5;  Surgeon: Erline Levine, MD;  Location: [facility];  Service: Neurosurgery;  Laterality: N/A;  **Stage 2** Percutaneous pedicle screw placement  L1-5    Past Medical History  Diagnosis Date  . Anxiety   . Depression   . Thyroid disease   . Allergy   . GERD (gastroesophageal reflux disease)   . IBS (irritable bowel syndrome)   . Ulcerative colitis   . Attention deficit disorder without mention of hyperactivity   . Unspecified asthma(493.90)   . Osteoarthrosis, unspecified whether generalized or localized, unspecified site   . Other and unspecified hyperlipidemia   . Diverticulosis   . Colitis 12/05/2010  . Unspecified essential hypertension     taken off meds since lost wt  . H/O hiatal hernia   . Hypertension   . Colon polyps        Review of Systems  Constitutional: Negative for fever and chills.  Genitourinary: Positive for urgency, frequency and flank pain. Negative for hematuria.      Objective:    BP 130/74 mmHg  Pulse 75  Temp(Src) 98.2 F (36.8 C) (Oral)  Resp 16  Ht 5\' 5"  (1.651 m)  Wt 166 lb (75.297 kg)  BMI 27.62 kg/m2  SpO2 97% Nursing note and vital signs reviewed.  Physical Exam  Constitutional: She is oriented to person, place, and time. She appears well-developed and well-nourished. No  distress.  Cardiovascular: Normal rate, regular rhythm, normal heart sounds and intact distal pulses.   Pulmonary/Chest: Effort normal and breath sounds normal.  Abdominal: There is CVA tenderness.  Neurological: She is alert and oriented to person, place, and time.  Skin: Skin is warm and dry.  Psychiatric: She has a normal mood and affect. Her behavior is normal. Judgment and thought content normal.       Assessment & Plan:   Problem List Items Addressed This Visit      Genitourinary   UTI (urinary tract infection) - Primary    UTI with concern for pyelonephritis and treated in the emergency department with urine culture pending. Started Cipro less than 12 hours ago. Continue current dosage of ciprofloxacin pending urine culture results. Continue previously prescribed pain medication as needed. Recommend stool softener with narcotic medication. Follow-up if symptoms worsen or fevers develop.          I have discontinued Ms. Frisbie's predniSONE. I am also having her maintain her FLUoxetine, albuterol, Probiotic Product (PROBIOTIC PO), Cholecalciferol (VITAMIN D PO), Cyanocobalamin (VITAMIN B-12 PO), ASACOL HD, buPROPion, CONCERTA, ALPRAZolam, meloxicam, lamoTRIgine, methocarbamol, amLODipine-valsartan, ondansetron, pantoprazole, SYNTHROID, traMADol, valACYclovir, levofloxacin, HYDROmorphone, Cyanocobalamin (B-12 PO), loratadine, Melatonin, ibuprofen, and ciprofloxacin.   Follow-up: Return if symptoms worsen or fail to improve.  Mauricio Po, FNP

## 2015-11-30 NOTE — Patient Instructions (Signed)
Thank you for choosing [facility].  Summary/Instructions:  Please continue to take the Dilaudid as needed.   We will follow up with the results of the urine culture.   If your symptoms worsen or fail to improve, please contact our office for further instruction, or in case of emergency go directly to the emergency room at the closest medical facility.

## 2015-12-01 ENCOUNTER — Ambulatory Visit: Payer: Medicare Other | Admitting: Internal Medicine

## 2015-12-01 LAB — URINE CULTURE: SPECIAL REQUESTS: NORMAL

## 2015-12-05 ENCOUNTER — Other Ambulatory Visit: Payer: Self-pay | Admitting: Internal Medicine

## 2015-12-06 ENCOUNTER — Encounter: Payer: Self-pay | Admitting: Internal Medicine

## 2015-12-06 ENCOUNTER — Ambulatory Visit (INDEPENDENT_AMBULATORY_CARE_PROVIDER_SITE_OTHER): Payer: Medicare Other | Admitting: Internal Medicine

## 2015-12-06 VITALS — BP 140/76 | HR 83 | Temp 98.1°F | Wt 152.0 lb

## 2015-12-06 DIAGNOSIS — M545 Low back pain: Secondary | ICD-10-CM | POA: Diagnosis not present

## 2015-12-06 DIAGNOSIS — M79605 Pain in left leg: Secondary | ICD-10-CM

## 2015-12-06 DIAGNOSIS — M4806 Spinal stenosis, lumbar region: Secondary | ICD-10-CM | POA: Diagnosis not present

## 2015-12-06 DIAGNOSIS — M4120 Other idiopathic scoliosis, site unspecified: Secondary | ICD-10-CM | POA: Diagnosis not present

## 2015-12-06 DIAGNOSIS — N10 Acute tubulo-interstitial nephritis: Secondary | ICD-10-CM | POA: Diagnosis not present

## 2015-12-06 DIAGNOSIS — M5416 Radiculopathy, lumbar region: Secondary | ICD-10-CM | POA: Diagnosis not present

## 2015-12-06 MED ORDER — CIPROFLOXACIN HCL 500 MG PO TABS: 500.0000 mg | ORAL_TABLET | Freq: Two times a day (BID) | ORAL | Status: DC

## 2015-12-06 MED ORDER — CRANBERRY 1000 MG PO CAPS: ORAL_CAPSULE | ORAL | Status: DC

## 2015-12-06 NOTE — Progress Notes (Signed)
Subjective:  Patient ID: [name], female    DOB: Apr 30, 1946  Age: 70 y.o. MRN: [id number]  CC: No chief complaint on file.   HPI [name] presents for ER f/u for another severe  LBP/ UTI episode. She could not move.  Of note: her dtr had the same issue w/severe LBP together w/a UTI.    C/o poor appetite. Outpatient Prescriptions Prior to Visit  Medication Sig Dispense Refill  . albuterol (PROVENTIL HFA;VENTOLIN HFA) 108 (90 BASE) MCG/ACT inhaler Inhale 1 puff into the lungs 4 (four) times daily as needed for wheezing or shortness of breath.    . ALPRAZolam (XANAX) 0.5 MG tablet TAKE ONE TABLET AT BEDTIME AS NEEDED 30 tablet 3  . amLODipine-valsartan (EXFORGE) 5-160 MG tablet Take 1 tablet by mouth daily. 30 tablet 11  . ASACOL HD 800 MG TBEC TAKE 3 TABLETS TWICE DAILY 180 each 2  . buPROPion (WELLBUTRIN XL) 300 MG 24 hr tablet Take 1 tablet (300 mg total) by mouth daily. 90 tablet 1  . Cholecalciferol (VITAMIN D PO) Take 1 tablet by mouth daily.    . ciprofloxacin (CIPRO) 500 MG tablet Take 1 tablet (500 mg total) by mouth 2 (two) times daily. 28 tablet 0  . CONCERTA 36 MG CR tablet Take 36 mg by mouth daily.     . Cyanocobalamin (B-12 PO) Take 1 tablet by mouth daily.    . Cyanocobalamin (VITAMIN B-12 PO) Take 2 tablets by mouth daily.    [name] FLUoxetine (PROZAC) 20 MG capsule Take 60 mg by mouth daily.    [name] HYDROmorphone (DILAUDID) 4 MG tablet Take 4 mg by mouth 2 (two) times daily as needed for moderate pain.     [name] ibuprofen (ADVIL,MOTRIN) 200 MG tablet Take 800 mg by mouth daily as needed for moderate pain.    [name] lamoTRIgine (LAMICTAL) 100 MG tablet Take 1 tablet by mouth 3 (three) times daily.    [name] loratadine (CLARITIN) 10 MG tablet Take 10 mg by mouth daily.    . Melatonin 5 MG TABS Take 10 mg by mouth at bedtime.    . meloxicam (MOBIC) 15 MG tablet Take 15 mg by mouth daily.     . methocarbamol (ROBAXIN) 500 MG tablet Take 1 tablet (500 mg total) by mouth 4 (four)  times daily. (Patient taking differently: Take 500 mg by mouth 2 (two) times daily. ) 60 tablet 1  . ondansetron (ZOFRAN) 4 MG tablet Take 1 tablet (4 mg total) by mouth every 8 (eight) hours as needed for nausea or vomiting. 20 tablet 2  . pantoprazole (PROTONIX) 40 MG tablet TAKE ONE TABLET TWICE DAILY 60 tablet 11  . Probiotic Product (PROBIOTIC PO) Take 1 capsule by mouth daily.    [name] SYNTHROID 50 MCG tablet TAKE TWO TABLETS IN THE MORNING (Patient taking differently: take 196mcg at bedtime) 60 tablet 3  . traMADol (ULTRAM) 50 MG tablet Take 1-2 tablets (50-100 mg total) by mouth 2 (two) times daily as needed. 120 tablet 2  . valACYclovir (VALTREX) 1000 MG tablet Take 1 tablet (1,000 mg total) by mouth 3 (three) times daily. 21 tablet 0  . levofloxacin (LEVAQUIN) 500 MG tablet Take 1 tablet (500 mg total) by mouth daily. (Patient not taking: Reported on 12/06/2015) 10 tablet 0   No facility-administered medications prior to visit.    ROS Review of Systems  Constitutional: Negative for chills, activity change, appetite change, fatigue and unexpected weight change.  HENT: Negative for  congestion, mouth sores and sinus pressure.   Eyes: Negative for visual disturbance.  Respiratory: Negative for cough and chest tightness.   Gastrointestinal: Negative for nausea and abdominal pain.  Genitourinary: Negative for frequency, difficulty urinating and vaginal pain.  Musculoskeletal: Positive for back pain. Negative for gait problem.  Skin: Negative for pallor and rash.  Neurological: Negative for dizziness, tremors, weakness, numbness and headaches.  Psychiatric/Behavioral: Negative for confusion and sleep disturbance.    Objective:  BP 140/76 mmHg  Pulse 83  Temp(Src) 98.1 F (36.7 C) (Oral)  Wt 152 lb (68.947 kg)  SpO2 97%  BP Readings from Last 3 Encounters:  12/06/15 140/76  11/30/15 130/74  11/30/15 132/64    Wt Readings from Last 3 Encounters:  12/06/15 152 lb (68.947 kg)    11/30/15 166 lb (75.297 kg)  09/14/15 152 lb (68.947 kg)    Physical Exam  Constitutional: She appears well-developed. No distress.  HENT:  Head: Normocephalic.  Right Ear: External ear normal.  Left Ear: External ear normal.  Nose: Nose normal.  Mouth/Throat: Oropharynx is clear and moist.  Eyes: Conjunctivae are normal. Pupils are equal, round, and reactive to light. Right eye exhibits no discharge. Left eye exhibits no discharge.  Neck: Normal range of motion. Neck supple. No JVD present. No tracheal deviation present. No thyromegaly present.  Cardiovascular: Normal rate, regular rhythm and normal heart sounds.   Pulmonary/Chest: No stridor. No respiratory distress. She has no wheezes.  Abdominal: Soft. Bowel sounds are normal. She exhibits no distension and no mass. There is no tenderness. There is no rebound and no guarding.  Musculoskeletal: She exhibits tenderness. She exhibits no edema.  Lymphadenopathy:    She has no cervical adenopathy.  Neurological: She displays normal reflexes. No cranial nerve deficit. She exhibits normal muscle tone. Coordination abnormal.  Skin: No rash noted. No erythema.  Psychiatric: She has a normal mood and affect. Her behavior is normal. Judgment and thought content normal.  LS tender Cane  Lab Results  Component Value Date   WBC 10.6* 11/29/2015   HGB 11.4* 11/29/2015   HCT 37.0 11/29/2015   PLT 242 11/29/2015   GLUCOSE 94 11/29/2015   CHOL 259* 03/30/2014   TRIG 177.0* 03/30/2014   HDL 47.40 03/30/2014   LDLDIRECT 182.5 02/02/2008   LDLCALC 176* 03/30/2014   ALT 13* 11/29/2015   AST 15 11/29/2015   NA 139 11/29/2015   K 3.7 11/29/2015   CL 105 11/29/2015   CREATININE 0.70 11/29/2015   BUN 9 11/29/2015   CO2 29 11/29/2015   TSH 0.77 05/05/2015   INR 1.0 11/25/2008   HGBA1C 5.5 04/24/2012    No results found.  Assessment & Plan:   There are no diagnoses linked to this encounter. I have discontinued [name]'s  levofloxacin. I am also having her maintain her FLUoxetine, albuterol, Probiotic Product (PROBIOTIC PO), Cholecalciferol (VITAMIN D PO), Cyanocobalamin (VITAMIN B-12 PO), ASACOL HD, buPROPion, CONCERTA, ALPRAZolam, meloxicam, lamoTRIgine, methocarbamol, amLODipine-valsartan, ondansetron, SYNTHROID, traMADol, valACYclovir, HYDROmorphone, Cyanocobalamin (B-12 PO), loratadine, Melatonin, ibuprofen, ciprofloxacin, and pantoprazole.  No orders of the defined types were placed in this encounter.     Follow-up: No Follow-up on file.  Walker Kehr, MD

## 2015-12-06 NOTE — Patient Instructions (Signed)
Please take Cipro and Dilaudid - if you have another episode

## 2015-12-06 NOTE — Progress Notes (Signed)
Pre visit review using our clinic review tool, if applicable. No additional management support is needed unless otherwise documented below in the visit note. 

## 2015-12-06 NOTE — Assessment & Plan Note (Signed)
2017 Recurrent pyelonephritis w/severe B LBP Cranberry tabs daily Cipro x 10 d and Dilaudid prn if relapsed

## 2015-12-06 NOTE — Assessment & Plan Note (Signed)
2017 Recurrent pyelonephritis w/severe B LBP - cipro and dilaudid if relapsed

## 2015-12-07 ENCOUNTER — Other Ambulatory Visit: Payer: Self-pay | Admitting: Internal Medicine

## 2015-12-07 NOTE — Telephone Encounter (Signed)
Please advise, thanks.

## 2015-12-15 ENCOUNTER — Other Ambulatory Visit: Payer: Self-pay | Admitting: Internal Medicine

## 2015-12-18 DIAGNOSIS — K5190 Ulcerative colitis, unspecified, without complications: Secondary | ICD-10-CM | POA: Diagnosis not present

## 2015-12-21 ENCOUNTER — Telehealth: Payer: Self-pay | Admitting: Internal Medicine

## 2015-12-29 MED ORDER — MESALAMINE 400 MG PO CPDR: 2400.0000 mg | DELAYED_RELEASE_CAPSULE | Freq: Every day | ORAL | Status: DC

## 2015-12-29 NOTE — Telephone Encounter (Signed)
Patient calling back regarding this. She states that she is out of med

## 2015-12-29 NOTE — Telephone Encounter (Signed)
Sent rx for Delzicol to [facility].  Patient was previously taking Asacol HD but says was given samples of the Delzicol and it works better.

## 2016-01-03 DIAGNOSIS — D225 Melanocytic nevi of trunk: Secondary | ICD-10-CM | POA: Diagnosis not present

## 2016-01-03 DIAGNOSIS — L570 Actinic keratosis: Secondary | ICD-10-CM | POA: Diagnosis not present

## 2016-01-03 DIAGNOSIS — L738 Other specified follicular disorders: Secondary | ICD-10-CM | POA: Diagnosis not present

## 2016-01-03 DIAGNOSIS — D692 Other nonthrombocytopenic purpura: Secondary | ICD-10-CM | POA: Diagnosis not present

## 2016-01-03 DIAGNOSIS — L821 Other seborrheic keratosis: Secondary | ICD-10-CM | POA: Diagnosis not present

## 2016-01-03 DIAGNOSIS — D1801 Hemangioma of skin and subcutaneous tissue: Secondary | ICD-10-CM | POA: Diagnosis not present

## 2016-01-03 DIAGNOSIS — L578 Other skin changes due to chronic exposure to nonionizing radiation: Secondary | ICD-10-CM | POA: Diagnosis not present

## 2016-01-24 ENCOUNTER — Telehealth: Payer: Self-pay | Admitting: Emergency Medicine

## 2016-01-24 DIAGNOSIS — R5383 Other fatigue: Secondary | ICD-10-CM

## 2016-01-24 NOTE — Telephone Encounter (Signed)
Pt called and asked if she can have some blood work done. Pt stated Dr. Camila Li discussed this with her at last visit. She is just very tired. Please follow up and let her know thanks.

## 2016-01-25 DIAGNOSIS — K5190 Ulcerative colitis, unspecified, without complications: Secondary | ICD-10-CM | POA: Diagnosis not present

## 2016-01-25 NOTE — Telephone Encounter (Signed)
Lab orders placed. Left detailed mess informing pt.

## 2016-01-25 NOTE — Telephone Encounter (Signed)
Pls order CBC, ESR, CMET, UA, Mg Thx

## 2016-02-06 DIAGNOSIS — F39 Unspecified mood [affective] disorder: Secondary | ICD-10-CM | POA: Diagnosis not present

## 2016-02-06 DIAGNOSIS — F900 Attention-deficit hyperactivity disorder, predominantly inattentive type: Secondary | ICD-10-CM | POA: Diagnosis not present

## 2016-02-08 ENCOUNTER — Other Ambulatory Visit (INDEPENDENT_AMBULATORY_CARE_PROVIDER_SITE_OTHER): Payer: Medicare Other

## 2016-02-08 DIAGNOSIS — M25569 Pain in unspecified knee: Secondary | ICD-10-CM | POA: Diagnosis not present

## 2016-02-08 DIAGNOSIS — K639 Disease of intestine, unspecified: Secondary | ICD-10-CM | POA: Diagnosis not present

## 2016-02-08 DIAGNOSIS — R5383 Other fatigue: Secondary | ICD-10-CM

## 2016-02-08 DIAGNOSIS — M0760 Enteropathic arthropathies, unspecified site: Secondary | ICD-10-CM | POA: Diagnosis not present

## 2016-02-08 DIAGNOSIS — M25559 Pain in unspecified hip: Secondary | ICD-10-CM | POA: Diagnosis not present

## 2016-02-08 LAB — SEDIMENTATION RATE: Sed Rate: 10 mm/hr (ref 0–30)

## 2016-02-08 LAB — COMPREHENSIVE METABOLIC PANEL
ALT: 14 U/L (ref 0–35)
AST: 13 U/L (ref 0–37)
Albumin: 4.3 g/dL (ref 3.5–5.2)
Alkaline Phosphatase: 80 U/L (ref 39–117)
BILIRUBIN TOTAL: 0.3 mg/dL (ref 0.2–1.2)
BUN: 20 mg/dL (ref 6–23)
CALCIUM: 9.8 mg/dL (ref 8.4–10.5)
CO2: 27 meq/L (ref 19–32)
CREATININE: 1.02 mg/dL (ref 0.40–1.20)
Chloride: 107 mEq/L (ref 96–112)
GFR: 56.91 mL/min — ABNORMAL LOW (ref >60.00)
GLUCOSE: 63 mg/dL — AB (ref 70–99)
Potassium: 4.6 mEq/L (ref 3.5–5.1)
SODIUM: 141 meq/L (ref 135–145)
Total Protein: 7 g/dL (ref 6.0–8.3)

## 2016-02-08 LAB — CBC WITH DIFFERENTIAL/PLATELET
BASOS ABS: 0 10*3/uL (ref 0.0–0.1)
Basophils Relative: 0.5 % (ref 0.0–3.0)
EOS ABS: 0.6 10*3/uL (ref 0.0–0.7)
Eosinophils Relative: 6.8 % — ABNORMAL HIGH (ref 0.0–5.0)
HCT: 36.8 % (ref 36.0–46.0)
Hemoglobin: 12.1 g/dL (ref 12.0–15.0)
LYMPHS ABS: 2.1 10*3/uL (ref 0.7–4.0)
Lymphocytes Relative: 24.4 % (ref 12.0–46.0)
MCHC: 32.9 g/dL (ref 30.0–36.0)
MCV: 80.2 fl (ref 78.0–100.0)
Monocytes Absolute: 1.1 10*3/uL — ABNORMAL HIGH (ref 0.1–1.0)
Monocytes Relative: 12.6 % — ABNORMAL HIGH (ref 3.0–12.0)
NEUTROS ABS: 4.8 10*3/uL (ref 1.4–7.7)
NEUTROS PCT: 55.7 % (ref 43.0–77.0)
PLATELETS: 338 10*3/uL (ref 150.0–400.0)
RBC: 4.58 Mil/uL (ref 3.87–5.11)
RDW: 15.9 % — ABNORMAL HIGH (ref 11.5–15.5)
WBC: 8.5 10*3/uL (ref 4.0–10.5)

## 2016-02-08 LAB — URINALYSIS, ROUTINE W REFLEX MICROSCOPIC
Hgb urine dipstick: NEGATIVE
Ketones, ur: NEGATIVE
Nitrite: NEGATIVE
PH: 5.5 (ref 5.0–8.0)
RBC / HPF: NONE SEEN (ref 0–?)
Specific Gravity, Urine: >=1.03 — AB (ref 1.000–1.030)
Urine Glucose: NEGATIVE
Urobilinogen, UA: 0.2 (ref 0.0–1.0)

## 2016-02-08 LAB — MAGNESIUM: MAGNESIUM: 2 mg/dL (ref 1.5–2.5)

## 2016-02-08 MED ORDER — CEFUROXIME AXETIL 500 MG PO TABS: 500.0000 mg | ORAL_TABLET | Freq: Two times a day (BID) | ORAL | 1 refills | Status: DC

## 2016-02-15 ENCOUNTER — Telehealth: Payer: Self-pay | Admitting: Internal Medicine

## 2016-02-15 NOTE — Telephone Encounter (Signed)
Pt called in and said that [facility]ener did not get the antibiotics that was sent in on the 17th.  Can this be sent again.

## 2016-02-16 ENCOUNTER — Other Ambulatory Visit: Payer: Self-pay | Admitting: Internal Medicine

## 2016-02-16 ENCOUNTER — Encounter: Payer: Self-pay | Admitting: Internal Medicine

## 2016-02-16 MED ORDER — CEFUROXIME AXETIL 500 MG PO TABS: 500.0000 mg | ORAL_TABLET | Freq: Two times a day (BID) | ORAL | 1 refills | Status: DC

## 2016-02-16 NOTE — Telephone Encounter (Signed)
Rec'd call pt states she sent MD an email [name] never received the antibiotic that he sent in. She is needing the med but needing it to be sent to the [facility] in [place]. Notified pt will [facility].../lmb

## 2016-02-16 NOTE — Telephone Encounter (Signed)
Done. See meds. Closing phone note.

## 2016-02-22 ENCOUNTER — Other Ambulatory Visit: Payer: Self-pay | Admitting: Internal Medicine

## 2016-03-07 DIAGNOSIS — M4806 Spinal stenosis, lumbar region: Secondary | ICD-10-CM | POA: Diagnosis not present

## 2016-03-22 DIAGNOSIS — K5190 Ulcerative colitis, unspecified, without complications: Secondary | ICD-10-CM | POA: Diagnosis not present

## 2016-03-22 DIAGNOSIS — K529 Noninfective gastroenteritis and colitis, unspecified: Secondary | ICD-10-CM | POA: Diagnosis not present

## 2016-04-04 DIAGNOSIS — M48061 Spinal stenosis, lumbar region without neurogenic claudication: Secondary | ICD-10-CM | POA: Diagnosis not present

## 2016-04-16 ENCOUNTER — Other Ambulatory Visit: Payer: Self-pay | Admitting: Internal Medicine

## 2016-05-07 DIAGNOSIS — F39 Unspecified mood [affective] disorder: Secondary | ICD-10-CM | POA: Diagnosis not present

## 2016-05-07 DIAGNOSIS — F900 Attention-deficit hyperactivity disorder, predominantly inattentive type: Secondary | ICD-10-CM | POA: Diagnosis not present

## 2016-05-20 DIAGNOSIS — M545 Low back pain: Secondary | ICD-10-CM | POA: Diagnosis not present

## 2016-05-23 DIAGNOSIS — K5190 Ulcerative colitis, unspecified, without complications: Secondary | ICD-10-CM | POA: Diagnosis not present

## 2016-05-27 DIAGNOSIS — F39 Unspecified mood [affective] disorder: Secondary | ICD-10-CM | POA: Diagnosis not present

## 2016-05-28 DIAGNOSIS — F39 Unspecified mood [affective] disorder: Secondary | ICD-10-CM | POA: Diagnosis not present

## 2016-05-29 DIAGNOSIS — Z6826 Body mass index (BMI) 26.0-26.9, adult: Secondary | ICD-10-CM | POA: Diagnosis not present

## 2016-05-29 DIAGNOSIS — M48061 Spinal stenosis, lumbar region without neurogenic claudication: Secondary | ICD-10-CM | POA: Diagnosis not present

## 2016-05-29 DIAGNOSIS — R030 Elevated blood-pressure reading, without diagnosis of hypertension: Secondary | ICD-10-CM | POA: Diagnosis not present

## 2016-05-29 DIAGNOSIS — M545 Low back pain: Secondary | ICD-10-CM | POA: Diagnosis not present

## 2016-05-29 DIAGNOSIS — M4120 Other idiopathic scoliosis, site unspecified: Secondary | ICD-10-CM | POA: Diagnosis not present

## 2016-05-29 DIAGNOSIS — M5416 Radiculopathy, lumbar region: Secondary | ICD-10-CM | POA: Diagnosis not present

## 2016-06-13 DIAGNOSIS — F39 Unspecified mood [affective] disorder: Secondary | ICD-10-CM | POA: Diagnosis not present

## 2016-06-13 DIAGNOSIS — F900 Attention-deficit hyperactivity disorder, predominantly inattentive type: Secondary | ICD-10-CM | POA: Diagnosis not present
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
## 2016-06-25 NOTE — Progress Notes (Signed)
Pre visit review using our clinic review tool, if applicable. No additional management support is needed unless otherwise documented below in the visit note. 

## 2016-06-26 ENCOUNTER — Ambulatory Visit (INDEPENDENT_AMBULATORY_CARE_PROVIDER_SITE_OTHER): Payer: Medicare Other | Admitting: *Deleted

## 2016-06-26 VITALS — BP 136/68 | HR 78 | Ht 65.0 in | Wt 158.0 lb

## 2016-06-26 DIAGNOSIS — Z0000 Encounter for general adult medical examination without abnormal findings: Secondary | ICD-10-CM | POA: Diagnosis not present

## 2016-06-26 DIAGNOSIS — Z1239 Encounter for other screening for malignant neoplasm of breast: Secondary | ICD-10-CM

## 2016-06-26 DIAGNOSIS — Z1231 Encounter for screening mammogram for malignant neoplasm of breast: Secondary | ICD-10-CM

## 2016-06-26 NOTE — Patient Instructions (Addendum)
Follow up with Dr.Plotnikov 07/03/16 @11 :15. Schedule appointment with Dr.Hernandez. Schedule Mammogram. Continue to eat heart healthy diet (full of fruits, vegetables, whole grains, lean protein, water--limit salt, fat, and sugar intake) and increase physical activity as tolerated. Continue doing brain stimulating activities (puzzles, reading, adult coloring books, staying active) to keep memory sharp.    Bone Densitometry Introduction Bone densitometry is an imaging test that uses a special X-ray to measure the amount of calcium and other minerals in your bones (bone density). This test is also known as a bone mineral density test or dual-energy X-ray absorptiometry (DXA). The test can measure bone density at your hip and your spine. It is similar to having a regular X-ray. You may have this test to:  Diagnose a condition that causes weak or thin bones (osteoporosis).  Predict your risk of a broken bone (fracture).  Determine how well osteoporosis treatment is working. Tell a health care provider about:  Any allergies you have.  All medicines you are taking, including vitamins, herbs, eye drops, creams, and over-the-counter medicines.  Any problems you or family members have had with anesthetic medicines.  Any blood disorders you have.  Any surgeries you have had.  Any medical conditions you have.  Possibility of pregnancy.  Any other medical test you had within the previous 14 days that used contrast material. What are the risks? Generally, this is a safe procedure. However, problems can occur and may include the following:  This test exposes you to a very small amount of radiation.  The risks of radiation exposure may be greater to unborn children. What happens before the procedure?  Do not take any calcium supplements for 24 hours before having the test. You can otherwise eat and drink what you usually do.  Take off all metal jewelry, eyeglasses, dental appliances, and  any other metal objects. What happens during the procedure?  You may lie on an exam table. There will be an X-ray generator below you and an imaging device above you.  Other devices, such as boxes or braces, may be used to position your body properly for the scan.  You will need to lie still while the machine slowly scans your body.  The images will show up on a computer monitor. What happens after the procedure? You may need more testing at a later time. This information is not intended to replace advice given to you by your health care provider. Make sure you discuss any questions you have with your health care provider. Document Released: 07/02/2004 Document Revised: 11/16/2015 Document Reviewed: 11/18/2013  2017 Elsevier   Mammogram A mammogram is an X-ray of the breasts that is done to check for abnormal changes. This procedure can screen for and detect any changes that may suggest breast cancer. A mammogram can also identify other changes and variations in the breast, such as:  Inflammation of the breast tissue (mastitis).  An infected area that contains a collection of pus (abscess).  A fluid-filled sac (cyst).  Fibrocystic changes. This is when breast tissue becomes denser, which can make the tissue feel rope-like or uneven under the skin.  Tumors that are not cancerous (benign). Tell a health care provider about:  Any allergies you have.  If you have breast implants.  If you have had previous breast disease, biopsy, or surgery.  If you are breastfeeding.  Any possibility that you could be pregnant, if this applies.  If you are younger than age 67.  If you have a family  history of breast cancer. What are the risks? Generally, this is a safe procedure. However, problems may occur, including:  Exposure to radiation. Radiation levels are very low with this test.  The results being misinterpreted.  The need for further tests.  The inability of the mammogram  to detect certain cancers. What happens before the procedure?  Schedule your test about 1-2 weeks after your menstrual period. This is usually when your breasts are the least tender.  If you have had a mammogram done at a different facility in the past, get the mammogram X-rays or have them sent to your current exam facility in order to compare them.  Wash your breasts and under your arms the day of the test.  Do not wear deodorants, perfumes, lotions, or powders anywhere on your body on the day of the test.  Remove any jewelry from your neck.  Wear clothes that you can change into and out of easily. What happens during the procedure?  You will undress from the waist up and put on a gown.  You will stand in front of the X-ray machine.  Each breast will be placed between two plastic or glass plates. The plates will compress your breast for a few seconds. Try to stay as relaxed as possible during the procedure. This does not cause any harm to your breasts and any discomfort you feel will be very brief.  X-rays will be taken from different angles of each breast. The procedure may vary among health care providers and hospitals. What happens after the procedure?  The mammogram will be examined by a specialist (radiologist).  You may need to repeat certain parts of the test, depending on the quality of the images. This is commonly done if the radiologist needs a better view of the breast tissue.  Ask when your test results will be ready. Make sure you get your test results.  You may resume your normal activities. This information is not intended to replace advice given to you by your health care provider. Make sure you discuss any questions you have with your health care provider. Document Released: 06/07/2000 Document Revised: 11/13/2015 Document Reviewed: 08/19/2014 Elsevier Interactive Patient Education  2017 [employer].

## 2016-07-03 ENCOUNTER — Ambulatory Visit: Payer: Medicare Other | Admitting: Internal Medicine

## 2016-07-03 ENCOUNTER — Ambulatory Visit (INDEPENDENT_AMBULATORY_CARE_PROVIDER_SITE_OTHER): Payer: Medicare Other | Admitting: Internal Medicine

## 2016-07-03 ENCOUNTER — Encounter: Payer: Self-pay | Admitting: Internal Medicine

## 2016-07-03 ENCOUNTER — Other Ambulatory Visit (INDEPENDENT_AMBULATORY_CARE_PROVIDER_SITE_OTHER): Payer: Medicare Other

## 2016-07-03 VITALS — BP 130/72 | HR 76 | Wt 158.0 lb

## 2016-07-03 DIAGNOSIS — M79605 Pain in left leg: Principal | ICD-10-CM

## 2016-07-03 DIAGNOSIS — I10 Essential (primary) hypertension: Secondary | ICD-10-CM

## 2016-07-03 DIAGNOSIS — E034 Atrophy of thyroid (acquired): Secondary | ICD-10-CM | POA: Diagnosis not present

## 2016-07-03 DIAGNOSIS — M545 Low back pain: Secondary | ICD-10-CM | POA: Diagnosis not present

## 2016-07-03 DIAGNOSIS — M5440 Lumbago with sciatica, unspecified side: Secondary | ICD-10-CM | POA: Diagnosis not present

## 2016-07-03 DIAGNOSIS — G8929 Other chronic pain: Secondary | ICD-10-CM

## 2016-07-03 DIAGNOSIS — J029 Acute pharyngitis, unspecified: Secondary | ICD-10-CM | POA: Insufficient documentation

## 2016-07-03 LAB — BASIC METABOLIC PANEL
BUN: 18 mg/dL (ref 6–23)
CO2: 24 meq/L (ref 19–32)
Calcium: 9.4 mg/dL (ref 8.4–10.5)
Chloride: 108 mEq/L (ref 96–112)
Creatinine, Ser: 0.91 mg/dL (ref 0.40–1.20)
GFR: 64.85 mL/min (ref >60.00)
GLUCOSE: 84 mg/dL (ref 70–99)
POTASSIUM: 4 meq/L (ref 3.5–5.1)
Sodium: 141 mEq/L (ref 135–145)

## 2016-07-03 LAB — URINALYSIS, ROUTINE W REFLEX MICROSCOPIC
Bilirubin Urine: NEGATIVE
Hgb urine dipstick: NEGATIVE
Ketones, ur: NEGATIVE
Nitrite: NEGATIVE
URINE GLUCOSE: NEGATIVE
Urobilinogen, UA: 0.2 (ref 0.0–1.0)
pH: 5.5 (ref 5.0–8.0)

## 2016-07-03 LAB — HEPATIC FUNCTION PANEL
ALBUMIN: 4.2 g/dL (ref 3.5–5.2)
ALT: 13 U/L (ref 0–35)
AST: 12 U/L (ref 0–37)
Alkaline Phosphatase: 89 U/L (ref 39–117)
Bilirubin, Direct: 0 mg/dL (ref 0.0–0.3)
TOTAL PROTEIN: 7.2 g/dL (ref 6.0–8.3)
Total Bilirubin: 0.3 mg/dL (ref 0.2–1.2)

## 2016-07-03 LAB — TSH: TSH: 2.42 u[IU]/mL (ref 0.35–4.50)

## 2016-07-03 MED ORDER — AMLODIPINE BESYLATE-VALSARTAN 5-160 MG PO TABS: 1.0000 | ORAL_TABLET | Freq: Every day | ORAL | 11 refills | Status: DC

## 2016-07-03 MED ORDER — AZITHROMYCIN 250 MG PO TABS: ORAL_TABLET | ORAL | 0 refills | Status: DC

## 2016-07-03 NOTE — Progress Notes (Signed)
Pre visit review using our clinic review tool, if applicable. No additional management support is needed unless otherwise documented below in the visit note. 

## 2016-07-03 NOTE — Assessment & Plan Note (Signed)
[facility]

## 2016-07-03 NOTE — Assessment & Plan Note (Signed)
Will ref to Dr Nelva Bush for inj Diclofenac prn

## 2016-07-03 NOTE — Assessment & Plan Note (Signed)
[facility] 

## 2016-07-03 NOTE — Progress Notes (Signed)
Subjective:  Patient ID: [name], female    DOB: Oct 04, 1945  Age: 71 y.o. MRN: [id number]  CC: No chief complaint on file.   HPI [name] presents for a ST x 1 week C/o chronic LBP - needs injections F/u HTN, hypothyroidism   Outpatient Medications Prior to Visit  Medication Sig Dispense Refill  . albuterol (PROVENTIL HFA;VENTOLIN HFA) 108 (90 BASE) MCG/ACT inhaler Inhale 1 puff into the lungs 4 (four) times daily as needed for wheezing or shortness of breath.    . ALPRAZolam (XANAX) 0.5 MG tablet TAKE ONE TABLET AT BEDTIME AS NEEDED 30 tablet 3  . amLODipine-valsartan (EXFORGE) 5-160 MG tablet Take 1 tablet by mouth daily. 30 tablet 11  . buPROPion (WELLBUTRIN XL) 300 MG 24 hr tablet Take 1 tablet (300 mg total) by mouth daily. 90 tablet 1  . Cholecalciferol (VITAMIN D PO) Take 1 tablet by mouth daily.    . CONCERTA 36 MG CR tablet Take 36 mg by mouth daily.     . Cranberry 1000 MG CAPS As directed 30 each 11  . Cyanocobalamin (VITAMIN B-12 PO) Take 2 tablets by mouth daily.    [name] FLUoxetine (PROZAC) 20 MG capsule Take 60 mg by mouth daily.    [name] HYDROmorphone (DILAUDID) 4 MG tablet Take 4 mg by mouth 2 (two) times daily as needed for moderate pain.     [name] ibuprofen (ADVIL,MOTRIN) 200 MG tablet Take 800 mg by mouth daily as needed for moderate pain.    [name] lamoTRIgine (LAMICTAL) 100 MG tablet Take 1 tablet by mouth 3 (three) times daily.    [name] loratadine (CLARITIN) 10 MG tablet Take 10 mg by mouth daily.    . Melatonin 5 MG TABS Take 10 mg by mouth at bedtime.    . Mesalamine (DELZICOL) 400 MG CPDR DR capsule Take 6 capsules (2,400 mg total) by mouth daily. 180 capsule 6  . methocarbamol (ROBAXIN) 500 MG tablet Take 1 tablet (500 mg total) by mouth 4 (four) times daily. (Patient taking differently: Take 500 mg by mouth 2 (two) times daily. ) 60 tablet 1  . ondansetron (ZOFRAN) 4 MG tablet Take 1 tablet (4 mg total) by mouth every 8 (eight) hours as needed for nausea or  vomiting. 20 tablet 2  . pantoprazole (PROTONIX) 40 MG tablet TAKE ONE TABLET TWICE DAILY 60 tablet 11  . Probiotic Product (PROBIOTIC PO) Take 1 capsule by mouth daily.    [name] SYNTHROID 50 MCG tablet TAKE 2 TABLETS EVERY MORNING 60 tablet 5  . meloxicam (MOBIC) 15 MG tablet TAKE ONE TABLET EACH DAY (Patient not taking: Reported on 07/03/2016) 30 tablet 2   No facility-administered medications prior to visit.     ROS Review of Systems  Constitutional: Positive for fatigue. Negative for activity change, appetite change, chills and unexpected weight change.  HENT: Positive for postnasal drip, rhinorrhea and sore throat. Negative for congestion, mouth sores and sinus pressure.   Eyes: Negative for visual disturbance.  Respiratory: Negative for cough and chest tightness.   Gastrointestinal: Negative for abdominal pain and nausea.  Genitourinary: Negative for difficulty urinating, frequency and vaginal pain.  Musculoskeletal: Positive for back pain and gait problem.  Skin: Negative for pallor and rash.  Neurological: Negative for dizziness, tremors, weakness, numbness and headaches.  Psychiatric/Behavioral: Negative for confusion and sleep disturbance.    Objective:  BP 130/72   Pulse 76   Wt 158 lb (71.7 kg)   SpO2 97%  BMI 26.29 kg/m   BP Readings from Last 3 Encounters:  07/03/16 130/72  06/26/16 136/68  12/06/15 140/76    Wt Readings from Last 3 Encounters:  07/03/16 158 lb (71.7 kg)  06/26/16 158 lb (71.7 kg)  12/06/15 152 lb (68.9 kg)    Physical Exam  Constitutional: She appears well-developed. No distress.  HENT:  Head: Normocephalic.  Right Ear: External ear normal.  Left Ear: External ear normal.  Nose: Nose normal.  Mouth/Throat: Oropharynx is clear and moist. No oropharyngeal exudate.  Eyes: Conjunctivae are normal. Pupils are equal, round, and reactive to light. Right eye exhibits no discharge. Left eye exhibits no discharge.  Neck: Normal range of motion.  Neck supple. No JVD present. No tracheal deviation present. No thyromegaly present.  Cardiovascular: Normal rate, regular rhythm and normal heart sounds.   Pulmonary/Chest: No stridor. No respiratory distress. She has no wheezes.  Abdominal: Soft. Bowel sounds are normal. She exhibits no distension and no mass. There is no tenderness. There is no rebound and no guarding.  Musculoskeletal: She exhibits tenderness. She exhibits no edema.  Lymphadenopathy:    She has no cervical adenopathy.  Neurological: She displays normal reflexes. No cranial nerve deficit. She exhibits normal muscle tone. Coordination abnormal.  Skin: No rash noted. No erythema.  Psychiatric: She has a normal mood and affect. Her behavior is normal. Judgment and thought content normal.  eryth throat LS tender Cane  Lab Results  Component Value Date   WBC 8.5 02/08/2016   HGB 12.1 02/08/2016   HCT 36.8 02/08/2016   PLT 338.0 02/08/2016   GLUCOSE 63 (L) 02/08/2016   CHOL 259 (H) 03/30/2014   TRIG 177.0 (H) 03/30/2014   HDL 47.40 03/30/2014   LDLDIRECT 182.5 02/02/2008   LDLCALC 176 (H) 03/30/2014   ALT 14 02/08/2016   AST 13 02/08/2016   NA 141 02/08/2016   K 4.6 02/08/2016   CL 107 02/08/2016   CREATININE 1.02 02/08/2016   BUN 20 02/08/2016   CO2 27 02/08/2016   TSH 0.77 05/05/2015   INR 1.0 11/25/2008   HGBA1C 5.5 04/24/2012    No results found.  Assessment & Plan:   There are no diagnoses linked to this encounter. I am having [name] maintain her FLUoxetine, albuterol, Probiotic Product (PROBIOTIC PO), Cholecalciferol (VITAMIN D PO), Cyanocobalamin (VITAMIN B-12 PO), buPROPion, CONCERTA, ALPRAZolam, lamoTRIgine, methocarbamol, amLODipine-valsartan, ondansetron, HYDROmorphone, loratadine, Melatonin, ibuprofen, pantoprazole, Cranberry, Mesalamine, SYNTHROID, meloxicam, diclofenac, and HYDROcodone-acetaminophen.  Meds ordered this encounter  Medications  . diclofenac (VOLTAREN) 75 MG EC tablet     Sig: Take 1 tablet by mouth 2 (two) times daily.  [name] HYDROcodone-acetaminophen (NORCO/VICODIN) 5-325 MG tablet     Follow-up: No Follow-up on file.  Walker Kehr, MD

## 2016-07-03 NOTE — Assessment & Plan Note (Signed)
Zpac Strep test

## 2016-07-04 DIAGNOSIS — M25559 Pain in unspecified hip: Secondary | ICD-10-CM | POA: Diagnosis not present

## 2016-07-04 DIAGNOSIS — K639 Disease of intestine, unspecified: Secondary | ICD-10-CM | POA: Diagnosis not present

## 2016-07-04 DIAGNOSIS — M25569 Pain in unspecified knee: Secondary | ICD-10-CM | POA: Diagnosis not present

## 2016-07-04 DIAGNOSIS — M19041 Primary osteoarthritis, right hand: Secondary | ICD-10-CM | POA: Diagnosis not present

## 2016-07-04 DIAGNOSIS — K529 Noninfective gastroenteritis and colitis, unspecified: Secondary | ICD-10-CM | POA: Diagnosis not present

## 2016-07-04 DIAGNOSIS — M19042 Primary osteoarthritis, left hand: Secondary | ICD-10-CM | POA: Diagnosis not present

## 2016-07-04 DIAGNOSIS — M79643 Pain in unspecified hand: Secondary | ICD-10-CM | POA: Diagnosis not present

## 2016-07-04 DIAGNOSIS — M0760 Enteropathic arthropathies, unspecified site: Secondary | ICD-10-CM | POA: Diagnosis not present

## 2016-07-04 DIAGNOSIS — M47816 Spondylosis without myelopathy or radiculopathy, lumbar region: Secondary | ICD-10-CM | POA: Diagnosis not present

## 2016-07-18 DIAGNOSIS — K5190 Ulcerative colitis, unspecified, without complications: Secondary | ICD-10-CM | POA: Diagnosis not present

## 2016-07-19 ENCOUNTER — Ambulatory Visit (INDEPENDENT_AMBULATORY_CARE_PROVIDER_SITE_OTHER): Payer: Medicare Other

## 2016-07-19 ENCOUNTER — Encounter: Payer: Self-pay | Admitting: Podiatry

## 2016-07-19 ENCOUNTER — Ambulatory Visit (INDEPENDENT_AMBULATORY_CARE_PROVIDER_SITE_OTHER): Payer: Medicare Other | Admitting: Podiatry

## 2016-07-19 DIAGNOSIS — M2042 Other hammer toe(s) (acquired), left foot: Secondary | ICD-10-CM | POA: Diagnosis not present

## 2016-07-19 DIAGNOSIS — M2041 Other hammer toe(s) (acquired), right foot: Secondary | ICD-10-CM | POA: Diagnosis not present

## 2016-07-19 DIAGNOSIS — M25473 Effusion, unspecified ankle: Secondary | ICD-10-CM

## 2016-07-19 DIAGNOSIS — M779 Enthesopathy, unspecified: Secondary | ICD-10-CM

## 2016-07-19 NOTE — Progress Notes (Signed)
   Subjective:    Patient ID: [name], female    DOB: Jul 25, 1945, 71 y.o.   MRN: [id number]  HPI  Chief Complaint  Patient presents with  . Nail Problem    BL; Discolored toe nails.   . Ankle Pain    BL; Intermittent Ankle Swelling x 5 years. Denies pain.   . Toe Pain    BL; 3rd toes x "for a long time"       Review of Systems     Objective:   Physical Exam        Assessment & Plan:

## 2016-07-22 NOTE — Progress Notes (Signed)
Subjective:     Patient ID: [name], female   DOB: February 18, 1946, 71 y.o.   MRN: [id number]  HPI patient presents stating she's had a lot of pain in both her feet and is had a hip fracture and feels like her left leg is shorter. She's had surgery for this and she's having trouble with generalized ambulation   Review of Systems  All other systems reviewed and are negative.      Objective:   Physical Exam  Cardiovascular: Intact distal pulses.   Musculoskeletal: Normal range of motion.  Neurological: She is alert.  Skin: Skin is warm.  Nursing note and vitals reviewed.  neurovascular status intact muscle strength adequate range of motion within normal limits with patient found to have forefoot discomfort bilateral with inflammation around the metatarsals moderate discomfort in the arch bilateral and discomfort in the heel. 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 Patient will have orthotics made and is scheduled with pedal orthotist for this procedure  X-ray report indicates that there is digital deformities depression of the arch and structural changes

## 2016-08-02 ENCOUNTER — Encounter: Payer: Self-pay | Admitting: Internal Medicine

## 2016-08-02 ENCOUNTER — Ambulatory Visit (INDEPENDENT_AMBULATORY_CARE_PROVIDER_SITE_OTHER): Payer: Medicare Other | Admitting: Internal Medicine

## 2016-08-02 VITALS — BP 114/60 | HR 76 | Ht 64.75 in | Wt 157.2 lb

## 2016-08-02 DIAGNOSIS — K5180 Other ulcerative colitis without complications: Secondary | ICD-10-CM | POA: Diagnosis not present

## 2016-08-02 DIAGNOSIS — R1310 Dysphagia, unspecified: Secondary | ICD-10-CM | POA: Diagnosis not present

## 2016-08-02 DIAGNOSIS — K219 Gastro-esophageal reflux disease without esophagitis: Secondary | ICD-10-CM | POA: Diagnosis not present

## 2016-08-02 MED ORDER — NA SULFATE-K SULFATE-MG SULF 17.5-3.13-1.6 GM/177ML PO SOLN: 1.0000 | Freq: Once | ORAL | 0 refills | Status: AC

## 2016-08-02 NOTE — Progress Notes (Signed)
HISTORY OF PRESENT ILLNESS:  [name] is a 71 y.o. female with multiple medical problems as listed below including rheumatoid arthritis for which she is currently on Remicade. She is followed in this office for ulcers colitis, IBS, GERD complicated by peptic stricture, and adenomatous colon polyps. She was last seen in this office 08/30/2015. Diagnosed with mild to moderate ulcerative colitis December 2013. She had on Remicade for her arthritis for about 2 years with resolution of GI symptoms. And subsequently come off Remicade as described. Was doing well on PPI for GERD. She has demonstrated suboptimal medical compliance. She acknowledges this. In any event, we wish to increase her mesalamine at the time of her last visit. We discussed biologic agents. We asked her to follow-up in 3 months. She follows up at this time. She is currently taking Delzicol 1.2 g daily. Her rheumatologist resumed her Remicade about 3 months ago. On 5 mg/kg every 8 weeks. She tells me that her arthritis is no better and that her rheumatologist wonders if I might put her on Humira. Current GI symptoms are that of fluctuating bowel habits. Occasional mucus. Not much blood. No abdominal pain. Review of blood work from 07/03/2016 reveals normal comprehensive metabolic panel. Blood work from August 2017 reveals normal comprehensive metabolic panel, CBC, and sedimentation rate. Patient does report good control of her reflux symptoms but is having significant issues with intermittent solid food dysphagia. She needs to be very careful. She has had prior esophageal dilation in 2012 (52 [place]) and is interested in repeat endoscopy with dilation given the current difficulties. Her antireflux medication is pantoprazole 40 mg twice daily. She requires this dosage to control symptoms. GI review of systems is otherwise negative.   REVIEW OF SYSTEMS:  All non-GI ROS negative except for sinus allergy trouble, anxiety, arthritis,  depression, sleeping problems, sore throat, swollen legs  Past Medical History:  Diagnosis Date  . Allergy   . Anxiety   . Attention deficit disorder without mention of hyperactivity   . Colitis 12/05/2010  . Colon polyps   . Depression   . Diverticulosis   . GERD (gastroesophageal reflux disease)   . H/O hiatal hernia   . Hypertension   . IBS (irritable bowel syndrome)   . Osteoarthrosis, unspecified whether generalized or localized, unspecified site   . Other and unspecified hyperlipidemia   . Thyroid disease   . Ulcerative colitis   . Unspecified asthma(493.90)   . Unspecified essential hypertension    taken off meds since lost wt    Past Surgical History:  Procedure Laterality Date  . ABDOMINAL EXPOSURE N/A 04/22/2014   Procedure: ABDOMINAL EXPOSURE;  Surgeon: Angelia Mould, MD;  Location: [facility];  Service: Vascular;  Laterality: N/A;  . ANTERIOR LATERAL LUMBAR FUSION 4 LEVELS Left 04/22/2014   Procedure: Left Lumbar one/two, two/three, three/four, four,five. Anterior lateral lumbar interbody fusion**Stage 1**;  Surgeon: Erline Levine, MD;  Location: [facility];  Service: Neurosurgery;  Laterality: Left;  . ANTERIOR LUMBAR FUSION N/A 04/22/2014   Procedure: Lumbar five/-Sacral one. Anterior lumbar interbody fusion with Dr. Scot Dock; Left Lumbar one/two, two/three/three/four, four/five. Anterior lateral lumbar interbody fusion**Stage 1**;  Surgeon: Erline Levine, MD;  Location: [facility];  Service: Neurosurgery;  Laterality: N/A;  . BACK SURGERY     fluid removed from between disks  . BREAST SURGERY     bil breast reduction   . CHOLECYSTECTOMY     Laparoscopic cholecystectomy  . COLONOSCOPY    .  HIP FRACTURE SURGERY     left  11/01/2013  . LUMBAR PERCUTANEOUS PEDICLE SCREW 4 LEVEL N/A 04/25/2014   Procedure: **Stage 2** Percutaneous pedicle screw placement L1-5;  Surgeon: Erline Levine, MD;  Location: [facility];  Service: Neurosurgery;  Laterality: N/A;   **Stage 2** Percutaneous pedicle screw placement L1-5  . SHOULDER SURGERY     right shoulder 3x   . TOE SURGERY     right big toe  . TOTAL SHOULDER ARTHROPLASTY    . UPPER GASTROINTESTINAL ENDOSCOPY      Social History AOKI SAAH  reports that she quit smoking about 37 years ago. She has quit using smokeless tobacco. She reports that she drinks about 0.5 oz of alcohol per week . She reports that she does not use drugs.  family history includes Arthritis in her father; Colon cancer (age of onset: 31) in her father; Healthy in her brother and daughter; Heart disease in her father and mother; Hypertension in her father.  Allergies  Allergen Reactions  . Ampicillin Nausea And Vomiting  . Erythromycin Nausea And Vomiting    Can take Z pac  . Hctz [Hydrochlorothiazide]     dizzy  . Metoprolol     doublevision   . Morphine And Related Nausea And Vomiting  . Penicillin G Other (See Comments)       PHYSICAL EXAMINATION: Vital signs: BP 114/60 (BP Location: Left Arm, Patient Position: Sitting, Cuff Size: Normal)   Pulse 76   Ht 5' 4.75" (1.645 m)   Wt 157 lb 4 oz (71.3 kg)   BMI 26.37 kg/m   Constitutional: Pleasant, generally well-appearing, no acute distress Psychiatric: alert and oriented x3, cooperative Eyes: extraocular movements intact, anicteric, conjunctiva pink Mouth: oral pharynx moist, no lesions Neck: supple no lymphadenopathy Cardiovascular: heart regular rate and rhythm, no murmur Lungs: clear to auscultation bilaterally Abdomen: soft, nontender, nondistended, no obvious ascites, no peritoneal signs, normal bowel sounds, no organomegaly Rectal:Deferred until colonoscopy Extremities: no clubbing cyanosis or lower extremity edema bilaterally. Chronic stasis changes Skin: no lesions on visible extremities Neuro: No focal deficits. Cranial nerves intact  ASSESSMENT:  #1. Mild-to-moderate ulcerative colitis diagnosed December 2013. Currently on mesalamine 1.2 g  daily and Remicade 5 mg/kg (restarted by her rheumatologist for arthritis 3 months ago). Not clear to me how active her inflammatory bowel disease is currently. Most recent blood work looks good #2. GERD, complicated by peptic stricture. Significant recurrent dysphagia #3. History of adenomatous colon polyps #4. Rheumatoid arthritis on Remicade #5. Suboptimal medical compliance continues  PLAN:  #1. Continue mesalamine at current dosage #2. Continue PPI current dosage #3. Schedule colonoscopy to assess the activity of her inflammatory bowel disease and provide neoplasia surveillance. Thereafter we can reassess her medical regimen with regards to colitis activity.The nature of the procedure, as well as the risks, benefits, and alternatives were carefully and thoroughly reviewed with the patient. Ample time for discussion and questions allowed. The patient understood, was satisfied, and agreed to proceed. #4. Schedule upper endoscopy with esophageal dilation.The nature of the procedure, as well as the risks, benefits, and alternatives were carefully and thoroughly reviewed with the patient. Ample time for discussion and questions allowed. The patient understood, was satisfied, and agreed to proceed.

## 2016-08-02 NOTE — Patient Instructions (Signed)

## 2016-08-04 ENCOUNTER — Other Ambulatory Visit: Payer: Self-pay | Admitting: Internal Medicine

## 2016-08-06 DIAGNOSIS — F900 Attention-deficit hyperactivity disorder, predominantly inattentive type: Secondary | ICD-10-CM | POA: Diagnosis not present

## 2016-08-06 DIAGNOSIS — F39 Unspecified mood [affective] disorder: Secondary | ICD-10-CM | POA: Diagnosis not present

## 2016-08-15 DIAGNOSIS — Z96611 Presence of right artificial shoulder joint: Secondary | ICD-10-CM | POA: Diagnosis not present

## 2016-08-15 DIAGNOSIS — M25512 Pain in left shoulder: Secondary | ICD-10-CM | POA: Diagnosis not present

## 2016-08-15 DIAGNOSIS — Z471 Aftercare following joint replacement surgery: Secondary | ICD-10-CM | POA: Diagnosis not present

## 2016-08-21 ENCOUNTER — Ambulatory Visit
Admission: RE | Admit: 2016-08-21 | Discharge: 2016-08-21 | Disposition: A | Discharge status: Home / Self Care | Payer: Medicare Other | Source: Ambulatory Visit | Attending: Internal Medicine | Admitting: Internal Medicine

## 2016-08-21 DIAGNOSIS — Z1239 Encounter for other screening for malignant neoplasm of breast: Secondary | ICD-10-CM

## 2016-08-21 DIAGNOSIS — Z1231 Encounter for screening mammogram for malignant neoplasm of breast: Secondary | ICD-10-CM | POA: Diagnosis not present

## 2016-08-22 DIAGNOSIS — L237 Allergic contact dermatitis due to plants, except food: Secondary | ICD-10-CM | POA: Diagnosis not present

## 2016-08-23 DIAGNOSIS — H40013 Open angle with borderline findings, low risk, bilateral: Secondary | ICD-10-CM | POA: Diagnosis not present

## 2016-08-24 ENCOUNTER — Other Ambulatory Visit: Payer: Self-pay | Admitting: Internal Medicine

## 2016-09-12 DIAGNOSIS — K5190 Ulcerative colitis, unspecified, without complications: Secondary | ICD-10-CM | POA: Diagnosis not present

## 2016-09-13 ENCOUNTER — Telehealth: Payer: Self-pay | Admitting: Internal Medicine

## 2016-09-13 MED ORDER — CEPHALEXIN 500 MG PO CAPS: 500.0000 mg | ORAL_CAPSULE | Freq: Four times a day (QID) | ORAL | 0 refills | Status: DC

## 2016-09-13 NOTE — Telephone Encounter (Signed)
OK - I emailed Keflex Rx Thx

## 2016-09-13 NOTE — Telephone Encounter (Signed)
PLEASE NOTE: All timestamps contained within this report are represented as [place] Standard Time. CONFIDENTIALTY NOTICE: This fax transmission is intended only for the addressee. It contains information that is legally privileged, confidential or otherwise protected from use or disclosure. If you are not the intended recipient, you are strictly prohibited from reviewing, disclosing, copying using or disseminating any of this information or taking any action in reliance on or regarding this information. If you have received this fax in error, please notify us immediately by telephone so that we can arrange for its return to [place]. Phone: [phone], Toll-Free: [phone], Fax: [phone] Page: 1 of 1 Call Id: [id number] [place] [facility] - Client [place] Patient Name: [name] DOB: April 28, 1946 Initial Comment Caller states infected finger, inside cuticle is green. states immune system is low; on a particular med. Nurse Assessment Nurse: Andria Frames, RN, Aeriel Date/Time Eilene Ghazi Time): 09/13/2016 12:55:07 PM Confirm and document reason for call. If symptomatic, describe symptoms. ---Caller states, infected finger, inside cuticle is green. states immune system is low; on a particular med. Caller denies fever. She has been soaking it. She did not injur the finger. She has been doing a lot of cleaning and moving stuff she is not sure if a piece of dirt got in there or what. It is the first finger on her left hand. Does the patient have any new or worsening symptoms? ---Yes Will a triage be completed? ---Yes Related visit to physician within the last 2 weeks? ---No Does the PT have any chronic conditions? (i.e. diabetes, asthma, etc.) ---Yes List chronic conditions. ---depression, colitis, thyroid Is this a behavioral health or substance abuse call? ---No Guidelines Guideline Title Affirmed Question Affirmed Notes Finger Pain [1]  Looks infected (spreading redness, red streak, pus) AND [2] large red area (more than 2 in or 5 cm, or entire finger) Final Disposition User See Physician within 4 Hours (or PCP triage) Hensel, RN, Aeriel Comments Pt is also on remicaid for arthritis. Caller states, she is moving. She really would just like a message passed along to the doctor that she wants an abx. Please contact pt. Caller really does not want to go in and be seen because she is moving. Referrals GO TO FACILITY REFUSED GO TO FACILITY REFUSED Disagree/Comply: Disagree Disagree/Comply Reason: Disagree with instructions

## 2016-09-28 HISTORY — PX: UPPER GASTROINTESTINAL ENDOSCOPY: SHX188

## 2016-10-08 ENCOUNTER — Encounter: Payer: Self-pay | Admitting: Internal Medicine

## 2016-10-08 ENCOUNTER — Ambulatory Visit (AMBULATORY_SURGERY_CENTER): Payer: Medicare Other | Admitting: Internal Medicine

## 2016-10-08 VITALS — BP 130/72 | HR 67 | Temp 97.7°F | Resp 12 | Ht 64.75 in | Wt 157.0 lb

## 2016-10-08 DIAGNOSIS — K219 Gastro-esophageal reflux disease without esophagitis: Secondary | ICD-10-CM | POA: Diagnosis not present

## 2016-10-08 DIAGNOSIS — K5180 Other ulcerative colitis without complications: Secondary | ICD-10-CM

## 2016-10-08 DIAGNOSIS — R1310 Dysphagia, unspecified: Secondary | ICD-10-CM

## 2016-10-08 DIAGNOSIS — K529 Noninfective gastroenteritis and colitis, unspecified: Secondary | ICD-10-CM

## 2016-10-08 MED ORDER — SODIUM CHLORIDE 0.9 % IV SOLN: 500.0000 mL | INTRAVENOUS | Status: DC

## 2016-10-08 NOTE — Op Note (Signed)
[place] [facility] Patient Name: [name] Procedure Date: 10/08/2016 10:51 AM MRN: [id number] Endoscopist: Docia Chuck. Henrene Pastor , MD Age: 71 Referring MD:  Date of Birth: April 16, 1946 Gender: Female Account #: [id number] Procedure:                Upper GI endoscopy, with [facility] dilation esophagus                            - 45F Indications:              Dysphagia. Previous esophageal dilation improved                            symptoms Medicines:                Monitored Anesthesia Care Procedure:                Pre-Anesthesia Assessment:                           - Prior to the procedure, a History and Physical                            was performed, and patient medications and                            allergies were reviewed. The patient's tolerance of                            previous anesthesia was also reviewed. The risks                            and benefits of the procedure and the sedation                            options and risks were discussed with the patient.                            All questions were answered, and informed consent                            was obtained. Prior Anticoagulants: The patient has                            taken no previous anticoagulant or antiplatelet                            agents. ASA Grade Assessment: III - A patient with                            severe systemic disease. After reviewing the risks                            and benefits, the patient was deemed in  satisfactory condition to undergo the procedure.                           After obtaining informed consent, the endoscope was                            passed under direct vision. Throughout the                            procedure, the patient's blood pressure, pulse, and                            oxygen saturations were monitored continuously. The                            Endoscope was introduced through the mouth, and                        advanced to the second part of duodenum. The upper                            GI endoscopy was accomplished without difficulty.                            The patient tolerated the procedure well. Scope In: Scope Out: Findings:                 The esophagus was normal. The scope was withdrawn.                            Dilation was performed with a Maloney dilator with                            no resistance at 85 Fr. No resistance. No heme.                            Tolerated well.                           The stomach was normal.                           The examined duodenum was normal.                           The cardia and gastric fundus were normal on                            retroflexion. Complications:            No immediate complications. Estimated Blood Loss:     Estimated blood loss: none. Impression:               - Normal esophagus. Dilated.                           - Normal stomach.                           -  Normal examined duodenum.                           - No specimens collected. Recommendation:           - Patient has a contact number available for                            emergencies. The signs and symptoms of potential                            delayed complications were discussed with the                            patient. Return to normal activities tomorrow.                            Written discharge instructions were provided to the                            patient.                           - Post dilation diet.                           - Continue present medications. Docia Chuck. Henrene Pastor, MD 10/08/2016 11:35:19 AM This report has been signed electronically.

## 2016-10-08 NOTE — Progress Notes (Signed)
Called to room to assist during endoscopic procedure.  Patient ID and intended procedure confirmed with present staff. Received instructions for my participation in the procedure from the performing physician.  

## 2016-10-08 NOTE — Patient Instructions (Signed)
YOU HAD AN ENDOSCOPIC PROCEDURE TODAY AT [facility]:   Refer to the procedure report that was given to you for any specific questions about what was found during the examination.  If the procedure report does not answer your questions, please call your gastroenterologist to clarify.  If you requested that your care partner not be given the details of your procedure findings, then the procedure report has been included in a sealed envelope for you to review at your convenience later.  YOU SHOULD EXPECT: Some feelings of bloating in the abdomen. Passage of more gas than usual.  Walking can help get rid of the air that was put into your GI tract during the procedure and reduce the bloating. If you had a lower endoscopy (such as a colonoscopy or flexible sigmoidoscopy) you may notice spotting of blood in your stool or on the toilet paper. If you underwent a bowel prep for your procedure, you may not have a normal bowel movement for a few days.  Please Note:  You might notice some irritation and congestion in your nose or some drainage.  This is from the oxygen used during your procedure.  There is no need for concern and it should clear up in a day or so.  SYMPTOMS TO REPORT IMMEDIATELY:   Following lower endoscopy (colonoscopy or flexible sigmoidoscopy):  Excessive amounts of blood in the stool  Significant tenderness or worsening of abdominal pains  Swelling of the abdomen that is new, acute  Fever of 100F or higher   Following upper endoscopy (EGD)  Vomiting of blood or coffee ground material  New chest pain or pain under the shoulder blades  Painful or persistently difficult swallowing  New shortness of breath  Fever of 100F or higher  Black, tarry-looking stools  For urgent or emergent issues, a gastroenterologist can be reached at any hour by calling [phone].   DIET:  We do recommend a small meal at first, but then you may proceed to your regular diet.  Drink  plenty of fluids but you should avoid alcoholic beverages for 24 hours.  ACTIVITY:  You should plan to take it easy for the rest of today and you should NOT DRIVE or use heavy machinery until tomorrow (because of the sedation medicines used during the test).    FOLLOW UP: Our staff will call the number listed on your records the next business day following your procedure to check on you and address any questions or concerns that you may have regarding the information given to you following your procedure. If we do not reach you, we will leave a message.  However, if you are feeling well and you are not experiencing any problems, there is no need to return our call.  We will assume that you have returned to your regular daily activities without incident.  If any biopsies were taken you will be contacted by phone or by letter within the next 1-3 weeks.  Please call us at [phone] if you have not heard about the biopsies in 3 weeks.   Await for biopsy results Post Esophageal Dilation Diet (handout given) Esophagitis and Stricture Diet (handout given) Repeat Colonoscopy Screening in 3 years Follow office visit with Dr. Henrene Pastor in 6 months   SIGNATURES/CONFIDENTIALITY: You and/or your care partner have signed paperwork which will be entered into your electronic medical record.  These signatures attest to the fact that that the information above on your After Visit Summary has been  reviewed and is understood.  Full responsibility of the confidentiality of this discharge information lies with you and/or your care-partner. 

## 2016-10-08 NOTE — Progress Notes (Signed)
Spontaneous respirations throughout. VSS. Resting comfortably. To PACU on room air. Report to  [facility].

## 2016-10-08 NOTE — Op Note (Signed)
[place] Patient Name: [name] Procedure Date: 10/08/2016 10:51 AM MRN: [id number] Endoscopist: Docia Chuck. Henrene Pastor , MD Age: 71 Referring MD:  Date of Birth: 11-24-1945 Gender: Female Account #: [id number] Procedure:                Colonoscopy, with biopsies Indications:              Chronic ulcerative pancolitis, Follow-up of chronic                            ulcerative pancolitis, Disease activity assessment                            of chronic ulcerative pancolitis, Assess                            therapeutic response to therapy of chronic                            ulcerative pancolitis. Initially diagnosed in June                            2012. Last examination December 2013. Also,Family                            history of colon cancer in first-degree relative                            and a personal history of adenomatous polyps Medicines:                Monitored Anesthesia Care Procedure:                Pre-Anesthesia Assessment:                           - Prior to the procedure, a History and Physical                            was performed, and patient medications and                            allergies were reviewed. The patient's tolerance of                            previous anesthesia was also reviewed. The risks                            and benefits of the procedure and the sedation                            options and risks were discussed with the patient.                            All questions were answered, and informed consent  was obtained. Prior Anticoagulants: The patient has                            taken no previous anticoagulant or antiplatelet                            agents. ASA Grade Assessment: II - A patient with                            mild systemic disease. After reviewing the risks                            and benefits, the patient was deemed in   satisfactory condition to undergo the procedure.                           After obtaining informed consent, the colonoscope                            was passed under direct vision. Throughout the                            procedure, the patient's blood pressure, pulse, and                            oxygen saturations were monitored continuously. The                            Colonoscope was introduced through the anus and                            advanced to the the cecum, identified by                            appendiceal orifice and ileocecal valve. The                            ileocecal valve, appendiceal orifice, and rectum                            were photographed. The quality of the bowel                            preparation was good. The colonoscopy was performed                            without difficulty. The patient tolerated the                            procedure well. The bowel preparation used was                            SUPREP. Scope In: 10:59:59 AM Scope Out: 11:14:43 AM Scope Withdrawal Time: 0 hours 10 minutes 38 seconds  Total Procedure Duration: 0 hours 14 minutes 44 seconds  Findings:                 Mild Inflammation characterized by erythema and                            granularity was found in a continuous and                            circumferential pattern from the rectum to the                            cecum, and slightly more prominent proximally. This                            was mild in severity, and when compared to previous                            examinations, the findings are improved. Biopsies                            were taken with a cold forceps for histology from                            all portions of the colon.                           A few diverticula were found in the sigmoid colon.                           The exam was otherwise without abnormality on                            direct and retroflexion  views. Complications:            No immediate complications. Estimated blood loss:                            None. Estimated Blood Loss:     Estimated blood loss: none. Impression:               - Pancolitis. Inflammation was found from the                            rectum to the cecum. This was mild in severity. The                            findings are improved compared to previous                            examinations. Biopsied.                           - Diverticulosis in the sigmoid colon.                           -  The examination was otherwise normal on direct                            and retroflexion views. Recommendation:           - Repeat colonoscopy in 3 years for surveillance.                           - EGD today. Please see report.                           - Resume previous diet.                           - Continue present medications.                           - Await pathology results.                           - Office follow-up with Dr. Henrene Pastor in 6 months Docia Chuck. Henrene Pastor, MD 10/08/2016 11:31:13 AM This report has been signed electronically.

## 2016-10-09 ENCOUNTER — Telehealth: Payer: Self-pay

## 2016-10-09 NOTE — Telephone Encounter (Signed)
  Follow up Call-  Call back number 10/08/2016  Post procedure Call Back phone  # [phone]  Permission to leave phone message Yes  Some recent data might be hidden     Patient questions:  Do you have a fever, pain , or abdominal swelling? No. Pain Score  0 *  Have you tolerated food without any problems? Yes.    Have you been able to return to your normal activities? Yes.    Do you have any questions about your discharge instructions: Diet   No. Medications  No. Follow up visit  No.  Do you have questions or concerns about your Care? No.  Actions: * If pain score is 4 or above: No action needed, pain <4.

## 2016-10-11 ENCOUNTER — Encounter: Payer: Self-pay | Admitting: Internal Medicine

## 2016-10-25 ENCOUNTER — Other Ambulatory Visit: Payer: Self-pay | Admitting: Internal Medicine

## 2016-10-28 NOTE — Telephone Encounter (Signed)
Called refill into pharmacy spoke w/Maryann gave MD approval.../lmb

## 2016-10-30 DIAGNOSIS — F39 Unspecified mood [affective] disorder: Secondary | ICD-10-CM | POA: Diagnosis not present

## 2016-10-30 DIAGNOSIS — F900 Attention-deficit hyperactivity disorder, predominantly inattentive type: Secondary | ICD-10-CM | POA: Diagnosis not present

## 2016-10-31 ENCOUNTER — Other Ambulatory Visit: Payer: Self-pay | Admitting: Internal Medicine

## 2016-11-07 DIAGNOSIS — K5190 Ulcerative colitis, unspecified, without complications: Secondary | ICD-10-CM | POA: Diagnosis not present

## 2016-11-25 DIAGNOSIS — H6120 Impacted cerumen, unspecified ear: Secondary | ICD-10-CM | POA: Diagnosis not present

## 2016-11-25 DIAGNOSIS — H1031 Unspecified acute conjunctivitis, right eye: Secondary | ICD-10-CM | POA: Diagnosis not present

## 2016-11-28 ENCOUNTER — Ambulatory Visit (INDEPENDENT_AMBULATORY_CARE_PROVIDER_SITE_OTHER): Payer: Medicare Other | Admitting: Internal Medicine

## 2016-11-28 VITALS — BP 122/76 | HR 72 | Ht 65.0 in | Wt 156.0 lb

## 2016-11-28 DIAGNOSIS — H9193 Unspecified hearing loss, bilateral: Secondary | ICD-10-CM | POA: Diagnosis not present

## 2016-11-28 DIAGNOSIS — J3489 Other specified disorders of nose and nasal sinuses: Secondary | ICD-10-CM | POA: Diagnosis not present

## 2016-11-28 DIAGNOSIS — H1031 Unspecified acute conjunctivitis, right eye: Secondary | ICD-10-CM | POA: Diagnosis not present

## 2016-11-28 DIAGNOSIS — H538 Other visual disturbances: Secondary | ICD-10-CM

## 2016-11-28 DIAGNOSIS — H6123 Impacted cerumen, bilateral: Secondary | ICD-10-CM

## 2016-11-28 DIAGNOSIS — B0231 Zoster conjunctivitis: Secondary | ICD-10-CM

## 2016-11-28 DIAGNOSIS — B0239 Other herpes zoster eye disease: Secondary | ICD-10-CM | POA: Diagnosis not present

## 2016-11-28 DIAGNOSIS — B029 Zoster without complications: Secondary | ICD-10-CM | POA: Insufficient documentation

## 2016-11-28 MED ORDER — MUPIROCIN 2 % EX OINT: 1.0000 "application " | TOPICAL_OINTMENT | Freq: Two times a day (BID) | CUTANEOUS | 0 refills | Status: AC

## 2016-11-28 MED ORDER — VALACYCLOVIR HCL 1 G PO TABS: 1000.0000 mg | ORAL_TABLET | Freq: Three times a day (TID) | ORAL | 0 refills | Status: DC

## 2016-11-28 MED ORDER — DOXYCYCLINE HYCLATE 100 MG PO TABS: 100.0000 mg | ORAL_TABLET | Freq: Two times a day (BID) | ORAL | 0 refills | Status: DC

## 2016-11-28 NOTE — Assessment & Plan Note (Signed)
Also for mupirocin asd,  to f/u any worsening symptoms or concerns

## 2016-11-28 NOTE — Assessment & Plan Note (Signed)
?   Significance, for optho appt as well

## 2016-11-28 NOTE — Assessment & Plan Note (Addendum)
At this point ? Worse with the sulfa related antibiotic?; to stop the polymixin B sulfae/trimethoprim, and given the blepharitis as well, will change to doxy course oral; may need further opthalmic topical meds per optho

## 2016-11-28 NOTE — Assessment & Plan Note (Signed)
Relatively mild but involving the right eye with subjective blurriness of vision; for valtrex course

## 2016-11-28 NOTE — Patient Instructions (Addendum)
Your ears were irrigated of wax today  OK to stop the current eye drops  Please take all new medication as prescribed - the valtrex for shingles, and doxycycline and mupirocin for other infection  You will be contacted regarding the referral for: Eye doctor asap (to see [facility] now)  Please continue all other medications as before, and refills have been done if requested.  Please have the pharmacy call with any other refills you may need.  Please keep your appointments with your specialists as you may have planned

## 2016-11-28 NOTE — Progress Notes (Signed)
Subjective:    Patient ID: [name], female    DOB: [birth year], 71 y.o.   MRN: [id number]  HPI  Here to f/u with right eye redness and painful rash near the eye. Started x 5 days primiraily with eye symptoms of pain, redness and d/c.  Seen at [facility] in [place] 3 days ago and begun on polymixin B sulfate and trimethoprim, but thinks she may be worse, and no vision is blurred that she did not have before.  Rash may be been minor near the right eye and not addressed at [facility], but now appears to have some crusting mult areas at the left temple that pt states may have vesicular to start.  Also mentions chronic nasal congestion and allergies not well controlled.  Also with recent worsening nasal sores with pain worst to tip of left nare with odd smell but no drainage or swelling she is aware. Pt denies chest pain, increased sob or doe, wheezing, orthopnea, PND, increased LE swelling, palpitations, dizziness or syncope. Also incidentally with bilat hearing reduced in the past wk - ? Wax impactions again Past Medical History:  Diagnosis Date  . Allergy   . Anxiety   . Asthma   . Attention deficit disorder without mention of hyperactivity   . Colitis 12/05/2010  . Colon polyps   . Depression   . Diverticulosis   . GERD (gastroesophageal reflux disease)   . H/O hiatal hernia   . Hypertension   . IBS (irritable bowel syndrome)   . Osteoarthrosis, unspecified whether generalized or localized, unspecified site   . Other and unspecified hyperlipidemia   . Thyroid disease   . Ulcerative colitis   . Unspecified asthma(493.90)   . Unspecified essential hypertension    taken off meds since lost wt   Past Surgical History:  Procedure Laterality Date  . ABDOMINAL EXPOSURE N/A 04/22/2014   Procedure: ABDOMINAL EXPOSURE;  Surgeon: Angelia Mould, MD;  Location: [place] [facility];  Service: Vascular;  Laterality: N/A;  . ANTERIOR LATERAL LUMBAR FUSION 4 LEVELS Left 04/22/2014   Procedure: Left Lumbar  one/two, two/three, three/four, four,five. Anterior lateral lumbar interbody fusion**Stage 1**;  Surgeon: Erline Levine, MD;  Location: [place] [facility];  Service: Neurosurgery;  Laterality: Left;  . ANTERIOR LUMBAR FUSION N/A 04/22/2014   Procedure: Lumbar five/-Sacral one. Anterior lumbar interbody fusion with Dr. Scot Dock; Left Lumbar one/two, two/three/three/four, four/five. Anterior lateral lumbar interbody fusion**Stage 1**;  Surgeon: Erline Levine, MD;  Location: [place] [facility];  Service: Neurosurgery;  Laterality: N/A;  . BACK SURGERY     fluid removed from between disks  . BREAST SURGERY     bil breast reduction   . CHOLECYSTECTOMY     Laparoscopic cholecystectomy  . COLONOSCOPY    . HIP FRACTURE SURGERY     left  11/01/2013  . LUMBAR PERCUTANEOUS PEDICLE SCREW 4 LEVEL N/A 04/25/2014   Procedure: **Stage 2** Percutaneous pedicle screw placement L1-5;  Surgeon: Erline Levine, MD;  Location: [place] [facility];  Service: Neurosurgery;  Laterality: N/A;  **Stage 2** Percutaneous pedicle screw placement L1-5  . SHOULDER SURGERY     right shoulder 3x   . TOE SURGERY     right big toe  . TOTAL SHOULDER ARTHROPLASTY    . UPPER GASTROINTESTINAL ENDOSCOPY      reports that she quit smoking about 38 years ago. She has quit using smokeless tobacco. She reports that she drinks about 0.5 oz of alcohol per week . She reports that she  does not use drugs. family history includes Arthritis in her father; Colon cancer (age of onset: 34) in her father; Healthy in her brother and daughter; Heart disease in her father and mother; Hypertension in her father. Allergies  Allergen Reactions  . Ampicillin Nausea And Vomiting  . Erythromycin Nausea And Vomiting    Can take Z pac  . Hctz [Hydrochlorothiazide]     dizzy  . Metoprolol     doublevision   . Morphine And Related Nausea And Vomiting  . Penicillin G Other (See Comments)   Current Outpatient Prescriptions on File Prior to Visit  Medication Sig Dispense  Refill  . ALPRAZolam (XANAX) 0.5 MG tablet TAKE ONE TABLET AT BEDTIME AS NEEDED 30 tablet 2  . amLODipine-valsartan (EXFORGE) 5-160 MG tablet TAKE 1 TABLET BY MOUTH EVERY DAY 30 tablet 5  . buPROPion (WELLBUTRIN XL) 300 MG 24 hr tablet Take 1 tablet (300 mg total) by mouth daily. 90 tablet 1  . Cholecalciferol (VITAMIN D PO) Take 1 tablet by mouth daily.    . CONCERTA 36 MG CR tablet Take 36 mg by mouth daily.     . Cranberry 1000 MG CAPS As directed 30 each 11  . Cyanocobalamin (VITAMIN B-12 PO) Take 2 tablets by mouth daily.    . diclofenac (VOLTAREN) 75 MG EC tablet Take 1 tablet by mouth 2 (two) times daily.    [name] FLUoxetine (PROZAC) 20 MG capsule Take 60 mg by mouth daily.    [name] HYDROmorphone (DILAUDID) 4 MG tablet Take 4 mg by mouth 2 (two) times daily as needed for moderate pain.     [name] inFLIXimab in sodium chloride 0.9 % Inject 5 mg/kg into the vein every 3 (three) months. 4 vials of 100mg     . lamoTRIgine (LAMICTAL) 100 MG tablet Take 1 tablet by mouth 3 (three) times daily.    [name] loratadine (CLARITIN) 10 MG tablet Take 10 mg by mouth daily.    . Melatonin 5 MG TABS Take 10 mg by mouth at bedtime.    . Mesalamine (DELZICOL) 400 MG CPDR DR capsule Take 6 capsules (2,400 mg total) by mouth daily. (Patient taking differently: Take 1,200 mg by mouth daily. ) 180 capsule 6  . methocarbamol (ROBAXIN) 500 MG tablet Take 1 tablet (500 mg total) by mouth 4 (four) times daily. (Patient taking differently: Take 500 mg by mouth 2 (two) times daily. ) 60 tablet 1  . pantoprazole (PROTONIX) 40 MG tablet TAKE ONE TABLET TWICE DAILY 60 tablet 11  . PROAIR HFA 108 (90 Base) MCG/ACT inhaler ONE PUFF FOUR TIMES DAILY AS NEEDED 8.5 g 0  . Probiotic Product (PROBIOTIC PO) Take 1 capsule by mouth daily.    [name] SYNTHROID 50 MCG tablet TAKE 2 TABLETS EVERY MORNING 60 tablet 5  . [DISCONTINUED] losartan-hydrochlorothiazide (HYZAAR) 50-12.5 MG per tablet Take 1 tablet by mouth daily.    . [DISCONTINUED] simvastatin  (ZOCOR) 20 MG tablet Take 20 mg by mouth every evening.     Current Facility-Administered Medications on File Prior to Visit  Medication Dose Route Frequency Provider Last Rate Last Dose  . 0.9 %  sodium chloride infusion  500 mL Intravenous Continuous Irene Shipper, MD       Review of Systems  Constitutional: Negative for other unusual diaphoresis or sweats HENT: Negative for ear discharge or swelling Eyes: Negative for other worsening visual disturbances Respiratory: Negative for stridor or other swelling  Gastrointestinal: Negative for worsening distension or other blood Genitourinary: Negative  for retention or other urinary change Musculoskeletal: Negative for other MSK pain or swelling Skin: Negative for color change or other new lesions Neurological: Negative for worsening tremors and other numbness  Psychiatric/Behavioral: Negative for worsening agitation or other fatigue All other system neg per pt     Objective:   Physical Exam BP 122/76   Pulse 72   Ht 5\' 5"  (1.651 m)   Wt 156 lb (70.8 kg)   SpO2 99%   BMI 25.96 kg/m  VS noted,  Constitutional: Pt appears in NAD HENT: Head: NCAT.  bilat ear canal wax impactions resolved with irrigation Right Ear: External ear normal.  Left Ear: External ear normal.  Bilat TM's clear  Max sinus areas non tender.  Pharynx with mild erythema, no exudate Eyes: . Pupils are equal, round, and reactive to light. Left Conjunctivae and EOM are normal, but right conjunctivis with diffuse erythema, swelling and d/c. Also right upper > left lower lid swelling and mild tender Nose: without d/c or deformity appreciated, + mild tender left nasal tip Neck: Neck supple. Gross normal ROM No LA or mass Cardiovascular: Normal rate and regular rhythm.   Pulmonary/Chest: Effort normal and breath sounds without rales or wheezing.  Neurological: Pt is alert. At baseline orientation, motor grossly intact, cn 2-12 grossly intact Skin: Skin is warm. no LE  edema; has + right templ rash - appears to be crusting likley prior grouped vesicles on erythem base in a tender area Psychiatric: Pt behavior is normal without agitation  No other exam findings      Assessment & Plan:

## 2016-11-28 NOTE — Assessment & Plan Note (Signed)
Improved with wax impaction resolved

## 2016-12-05 DIAGNOSIS — K5190 Ulcerative colitis, unspecified, without complications: Secondary | ICD-10-CM | POA: Diagnosis not present

## 2016-12-06 DIAGNOSIS — M0760 Enteropathic arthropathies, unspecified site: Secondary | ICD-10-CM | POA: Diagnosis not present

## 2016-12-06 DIAGNOSIS — M1990 Unspecified osteoarthritis, unspecified site: Secondary | ICD-10-CM | POA: Diagnosis not present

## 2016-12-06 DIAGNOSIS — K5190 Ulcerative colitis, unspecified, without complications: Secondary | ICD-10-CM | POA: Diagnosis not present

## 2016-12-06 DIAGNOSIS — M79643 Pain in unspecified hand: Secondary | ICD-10-CM | POA: Diagnosis not present

## 2016-12-27 ENCOUNTER — Other Ambulatory Visit: Payer: Self-pay | Admitting: Internal Medicine

## 2017-01-02 DIAGNOSIS — K5190 Ulcerative colitis, unspecified, without complications: Secondary | ICD-10-CM | POA: Diagnosis not present

## 2017-01-02 DIAGNOSIS — R5383 Other fatigue: Secondary | ICD-10-CM | POA: Diagnosis not present

## 2017-02-12 ENCOUNTER — Encounter: Payer: Self-pay | Admitting: Podiatry

## 2017-02-12 ENCOUNTER — Ambulatory Visit (INDEPENDENT_AMBULATORY_CARE_PROVIDER_SITE_OTHER): Payer: Medicare Other | Admitting: Podiatry

## 2017-02-12 DIAGNOSIS — L02619 Cutaneous abscess of unspecified foot: Secondary | ICD-10-CM

## 2017-02-12 DIAGNOSIS — L03119 Cellulitis of unspecified part of limb: Secondary | ICD-10-CM | POA: Diagnosis not present

## 2017-02-12 MED ORDER — CEPHALEXIN 500 MG PO CAPS: 500.0000 mg | ORAL_CAPSULE | Freq: Two times a day (BID) | ORAL | 1 refills | Status: DC

## 2017-02-13 ENCOUNTER — Ambulatory Visit: Payer: Medicare Other | Admitting: Podiatry

## 2017-02-21 ENCOUNTER — Encounter: Payer: Self-pay | Admitting: Internal Medicine

## 2017-02-27 DIAGNOSIS — K5190 Ulcerative colitis, unspecified, without complications: Secondary | ICD-10-CM | POA: Diagnosis not present

## 2017-03-04 DIAGNOSIS — M25512 Pain in left shoulder: Secondary | ICD-10-CM | POA: Diagnosis not present

## 2017-03-04 DIAGNOSIS — G8929 Other chronic pain: Secondary | ICD-10-CM | POA: Diagnosis not present

## 2017-03-04 DIAGNOSIS — M542 Cervicalgia: Secondary | ICD-10-CM | POA: Diagnosis not present

## 2017-03-04 DIAGNOSIS — Z96611 Presence of right artificial shoulder joint: Secondary | ICD-10-CM | POA: Diagnosis not present

## 2017-03-04 DIAGNOSIS — Z471 Aftercare following joint replacement surgery: Secondary | ICD-10-CM | POA: Diagnosis not present

## 2017-03-13 ENCOUNTER — Other Ambulatory Visit: Payer: Self-pay | Admitting: Internal Medicine

## 2017-03-29 ENCOUNTER — Other Ambulatory Visit: Payer: Self-pay | Admitting: Internal Medicine

## 2017-04-09 NOTE — Progress Notes (Signed)
Patient presents stating that she is having pain in her right foot below 2nd toe  All other systems negative   Prescription for Keflex written to reduce cellulitic symptoms. She is to follow

## 2017-04-11 ENCOUNTER — Other Ambulatory Visit: Payer: Self-pay | Admitting: Internal Medicine

## 2017-04-23 ENCOUNTER — Ambulatory Visit (INDEPENDENT_AMBULATORY_CARE_PROVIDER_SITE_OTHER): Payer: Medicare Other | Admitting: Internal Medicine

## 2017-04-23 ENCOUNTER — Encounter: Payer: Self-pay | Admitting: Internal Medicine

## 2017-04-23 DIAGNOSIS — F329 Major depressive disorder, single episode, unspecified: Secondary | ICD-10-CM

## 2017-04-23 DIAGNOSIS — K5100 Ulcerative (chronic) pancolitis without complications: Secondary | ICD-10-CM

## 2017-04-23 DIAGNOSIS — F411 Generalized anxiety disorder: Secondary | ICD-10-CM | POA: Diagnosis not present

## 2017-04-23 DIAGNOSIS — I10 Essential (primary) hypertension: Secondary | ICD-10-CM | POA: Diagnosis not present

## 2017-04-23 MED ORDER — FLUOXETINE HCL 20 MG PO CAPS: 60.0000 mg | ORAL_CAPSULE | Freq: Every day | ORAL | 3 refills | Status: DC

## 2017-04-23 MED ORDER — AMLODIPINE BESYLATE-VALSARTAN 5-160 MG PO TABS: 1.0000 | ORAL_TABLET | Freq: Every day | ORAL | 11 refills | Status: DC

## 2017-04-23 MED ORDER — ZOSTER VAC RECOMB ADJUVANTED 50 MCG/0.5ML IM SUSR: 0.5000 mL | Freq: Once | INTRAMUSCULAR | 1 refills | Status: AC

## 2017-04-23 MED ORDER — ALPRAZOLAM 0.5 MG PO TABS: 0.5000 mg | ORAL_TABLET | Freq: Every evening | ORAL | 2 refills | Status: DC | PRN

## 2017-04-23 NOTE — Assessment & Plan Note (Signed)
Xanax prn 

## 2017-04-23 NOTE — Assessment & Plan Note (Signed)
remicade

## 2017-04-23 NOTE — Assessment & Plan Note (Signed)
Exforge

## 2017-04-23 NOTE — Progress Notes (Signed)
Subjective:  Patient ID: [name], female    DOB: 1945-09-22  Age: 71 y.o. MRN: [id number]  CC: No chief complaint on file.   HPI [name] presents for HTN, OA, anxiety f/u. C/o anxiety - moved to a new house  Outpatient Medications Prior to Visit  Medication Sig Dispense Refill  . ALPRAZolam (XANAX) 0.5 MG tablet TAKE ONE TABLET AT BEDTIME AS NEEDED 30 tablet 2  . amLODipine-valsartan (EXFORGE) 5-160 MG tablet Take 1 tablet by mouth daily. Follow-up appt is due must see provider for future refills 30 tablet 0  . buPROPion (WELLBUTRIN XL) 300 MG 24 hr tablet Take 1 tablet (300 mg total) by mouth daily. 90 tablet 1  . cephALEXin (KEFLEX) 500 MG capsule Take 1 capsule (500 mg total) by mouth 2 (two) times daily. 15 capsule 1  . Cholecalciferol (VITAMIN D PO) Take 1 tablet by mouth daily.    . CONCERTA 36 MG CR tablet Take 36 mg by mouth daily.     . Cranberry 1000 MG CAPS As directed 30 each 11  . Cyanocobalamin (VITAMIN B-12 PO) Take 2 tablets by mouth daily.    . diclofenac (VOLTAREN) 75 MG EC tablet Take 1 tablet by mouth 2 (two) times daily.    [name] doxycycline (VIBRA-TABS) 100 MG tablet Take 1 tablet (100 mg total) by mouth 2 (two) times daily. 20 tablet 0  . FLUoxetine (PROZAC) 20 MG capsule Take 60 mg by mouth daily.    [name] HYDROmorphone (DILAUDID) 4 MG tablet Take 4 mg by mouth 2 (two) times daily as needed for moderate pain.     [name] inFLIXimab in sodium chloride 0.9 % Inject 5 mg/kg into the vein every 3 (three) months. 4 vials of 100mg     . lamoTRIgine (LAMICTAL) 100 MG tablet Take 1 tablet by mouth 3 (three) times daily.    [name] loratadine (CLARITIN) 10 MG tablet Take 10 mg by mouth daily.    . Melatonin 5 MG TABS Take 10 mg by mouth at bedtime.    . Mesalamine (DELZICOL) 400 MG CPDR DR capsule Take 6 capsules (2,400 mg total) by mouth daily. (Patient taking differently: Take 1,200 mg by mouth daily. ) 180 capsule 6  . methocarbamol (ROBAXIN) 500 MG tablet Take 1 tablet  (500 mg total) by mouth 4 (four) times daily. (Patient taking differently: Take 500 mg by mouth 2 (two) times daily. ) 60 tablet 1  . pantoprazole (PROTONIX) 40 MG tablet TAKE ONE TABLET TWICE DAILY 60 tablet 11  . polymyxin B 500,000 Units in sodium chloride irrigation 0.9 % 500 mL Irrigate with as directed once.    [name] PROAIR HFA 108 (90 Base) MCG/ACT inhaler ONE PUFF FOUR TIMES DAILY AS NEEDED 8.5 g 0  . Probiotic Product (PROBIOTIC PO) Take 1 capsule by mouth daily.    [name] SYNTHROID 50 MCG tablet Take 2 tablets (100 mcg total) by mouth every morning. 180 tablet 1  . valACYclovir (VALTREX) 1000 MG tablet Take 1 tablet (1,000 mg total) by mouth 3 (three) times daily. 21 tablet 0   Facility-Administered Medications Prior to Visit  Medication Dose Route Frequency Provider Last Rate Last Dose  . 0.9 %  sodium chloride infusion  500 mL Intravenous Continuous Irene Shipper, MD        ROS Review of Systems  Constitutional: Positive for fatigue. Negative for activity change, appetite change, chills and unexpected weight change.  HENT: Negative for congestion, mouth sores and sinus pressure.  Eyes: Negative for visual disturbance.  Respiratory: Negative for cough and chest tightness.   Gastrointestinal: Negative for abdominal pain and nausea.  Genitourinary: Negative for difficulty urinating, frequency and vaginal pain.  Musculoskeletal: Positive for arthralgias, back pain and gait problem.  Skin: Negative for pallor and rash.  Neurological: Negative for dizziness, tremors, weakness, numbness and headaches.  Psychiatric/Behavioral: Negative for confusion and sleep disturbance. The patient is nervous/anxious.     Objective:  BP 132/70 (BP Location: Left Arm, Patient Position: Sitting, Cuff Size: Normal)   Pulse 74   Temp 98.2 F (36.8 C) (Oral)   Ht 5\' 5"  (1.651 m)   Wt 159 lb (72.1 kg)   SpO2 99%   BMI 26.46 kg/m   BP Readings from Last 3 Encounters:  04/23/17 132/70  11/28/16 122/76    10/08/16 130/72    Wt Readings from Last 3 Encounters:  04/23/17 159 lb (72.1 kg)  11/28/16 156 lb (70.8 kg)  10/08/16 157 lb (71.2 kg)    Physical Exam  Constitutional: She appears well-developed. No distress.  HENT:  Head: Normocephalic.  Right Ear: External ear normal.  Left Ear: External ear normal.  Nose: Nose normal.  Mouth/Throat: Oropharynx is clear and moist.  Eyes: Pupils are equal, round, and reactive to light. Conjunctivae are normal. Right eye exhibits no discharge. Left eye exhibits no discharge.  Neck: Normal range of motion. Neck supple. No JVD present. No tracheal deviation present. No thyromegaly present.  Cardiovascular: Normal rate, regular rhythm and normal heart sounds.   Pulmonary/Chest: No stridor. No respiratory distress. She has no wheezes.  Abdominal: Soft. Bowel sounds are normal. She exhibits no distension and no mass. There is no tenderness. There is no rebound and no guarding.  Musculoskeletal: She exhibits tenderness. She exhibits no edema.  Lymphadenopathy:    She has no cervical adenopathy.  Neurological: She displays normal reflexes. No cranial nerve deficit. She exhibits normal muscle tone. Coordination abnormal.  Skin: No rash noted. No erythema.  Psychiatric: Her behavior is normal. Judgment and thought content normal.  cane  Lab Results  Component Value Date   WBC 8.5 02/08/2016   HGB 12.1 02/08/2016   HCT 36.8 02/08/2016   PLT 338.0 02/08/2016   GLUCOSE 84 07/03/2016   CHOL 259 (H) 03/30/2014   TRIG 177.0 (H) 03/30/2014   HDL 47.40 03/30/2014   LDLDIRECT 182.5 02/02/2008   LDLCALC 176 (H) 03/30/2014   ALT 13 07/03/2016   AST 12 07/03/2016   NA 141 07/03/2016   K 4.0 07/03/2016   CL 108 07/03/2016   CREATININE 0.91 07/03/2016   BUN 18 07/03/2016   CO2 24 07/03/2016   TSH 2.42 07/03/2016   INR 1.0 11/25/2008   HGBA1C 5.5 04/24/2012    Mm Digital Screening Bilateral  Result Date: 08/21/2016 CLINICAL DATA:  Screening.  EXAM: DIGITAL SCREENING BILATERAL MAMMOGRAM WITH CAD COMPARISON:  Previous exam(s). ACR Breast Density Category b: There are scattered areas of fibroglandular density. FINDINGS: There are no findings suspicious for malignancy. Images were processed with CAD. IMPRESSION: No mammographic evidence of malignancy. A result letter of this screening mammogram will be mailed directly to the patient. RECOMMENDATION: Screening mammogram in one year. (Code:SM-B-01Y) BI-RADS CATEGORY  1: Negative. Electronically Signed   By: Marin Olp M.D.   On: 08/21/2016 11:57    Assessment & Plan:   There are no diagnoses linked to this encounter. I am having Ms. Emert maintain her FLUoxetine, Probiotic Product (PROBIOTIC PO), Cholecalciferol (VITAMIN D PO), Cyanocobalamin (  VITAMIN B-12 PO), buPROPion, CONCERTA, lamoTRIgine, methocarbamol, HYDROmorphone, loratadine, Melatonin, Cranberry, Mesalamine, diclofenac, inFLIXimab in sodium chloride 0.9 %, ALPRAZolam, PROAIR HFA, (polymyxin B 500,000 Units in sodium chloride irrigation 0.9 % 500 mL), valACYclovir, doxycycline, pantoprazole, cephALEXin, amLODipine-valsartan, and SYNTHROID. We will continue to administer sodium chloride.  No orders of the defined types were placed in this encounter.    Follow-up: No Follow-up on file.  Walker Kehr, MD

## 2017-04-23 NOTE — Assessment & Plan Note (Signed)
meds reniewed (xanax, Fluoxetine)

## 2017-04-23 NOTE — Patient Instructions (Signed)
Sinus rinse ([facility])

## 2017-04-24 ENCOUNTER — Encounter: Payer: Self-pay | Admitting: Internal Medicine

## 2017-04-24 DIAGNOSIS — K5190 Ulcerative colitis, unspecified, without complications: Secondary | ICD-10-CM | POA: Diagnosis not present

## 2017-04-29 DIAGNOSIS — F900 Attention-deficit hyperactivity disorder, predominantly inattentive type: Secondary | ICD-10-CM | POA: Diagnosis not present

## 2017-04-29 DIAGNOSIS — F39 Unspecified mood [affective] disorder: Secondary | ICD-10-CM | POA: Diagnosis not present

## 2017-05-01 ENCOUNTER — Other Ambulatory Visit: Payer: Self-pay | Admitting: Internal Medicine

## 2017-05-01 DIAGNOSIS — M5432 Sciatica, left side: Principal | ICD-10-CM

## 2017-05-01 DIAGNOSIS — M5431 Sciatica, right side: Secondary | ICD-10-CM

## 2017-05-12 ENCOUNTER — Encounter: Payer: Self-pay | Admitting: Internal Medicine

## 2017-05-13 NOTE — Telephone Encounter (Signed)
Can you please put a new referral in for pain mgmt. Thank you

## 2017-05-20 ENCOUNTER — Other Ambulatory Visit: Payer: Self-pay | Admitting: Internal Medicine

## 2017-05-20 DIAGNOSIS — M5416 Radiculopathy, lumbar region: Secondary | ICD-10-CM

## 2017-05-29 DIAGNOSIS — M0760 Enteropathic arthropathies, unspecified site: Secondary | ICD-10-CM | POA: Diagnosis not present

## 2017-05-29 DIAGNOSIS — M1990 Unspecified osteoarthritis, unspecified site: Secondary | ICD-10-CM | POA: Diagnosis not present

## 2017-05-29 DIAGNOSIS — K5190 Ulcerative colitis, unspecified, without complications: Secondary | ICD-10-CM | POA: Diagnosis not present

## 2017-05-29 DIAGNOSIS — M25569 Pain in unspecified knee: Secondary | ICD-10-CM | POA: Diagnosis not present

## 2017-05-29 DIAGNOSIS — M79643 Pain in unspecified hand: Secondary | ICD-10-CM | POA: Diagnosis not present

## 2017-06-09 ENCOUNTER — Encounter: Payer: Self-pay | Admitting: Physical Medicine & Rehabilitation

## 2017-06-19 DIAGNOSIS — K5190 Ulcerative colitis, unspecified, without complications: Secondary | ICD-10-CM | POA: Diagnosis not present

## 2017-06-27 NOTE — Progress Notes (Addendum)
Subjective:   [name] is a 72 y.o. female who presents for Medicare Annual (Subsequent) preventive examination.  Review of Systems:  No ROS.  Medicare Wellness Visit. Additional risk factors are reflected in the social history.  Cardiac Risk Factors include: advanced age (>49men, >60 women);dyslipidemia;hypertension Sleep patterns: gets up 1-2 times nightly to void and sleeps 6-7 hours nightly.    Home Safety/Smoke Alarms: Feels safe in home. Smoke alarms in place.  Living environment; residence and Firearm Safety: 1-story house/ trailer, no firearms. Lives with husband, no needs for DME, good support system Seat Belt Safety/Bike Helmet: Wears seat belt.     Objective:     Vitals: BP 138/74   Pulse 76   Resp 20   Ht 5\' 5"  (1.651 m)   Wt 162 lb (73.5 kg)   SpO2 98%   BMI 26.96 kg/m   Body mass index is 26.96 kg/m.  Advanced Directives 07/01/2017 06/26/2016 11/29/2015 08/08/2015 05/24/2015 04/22/2014 04/20/2014  Does Patient Have a Medical Advance Directive? Yes Yes Yes Yes Yes No No  Type of Paramedic of [place];Living will Living will Living will [place];Living will Living will;Healthcare Power of Attorney - -  Does patient want to make changes to medical advance directive? - - No - Patient declined - - - -  Copy of [place] in Chart? No - copy requested - No - copy requested No - copy requested No - copy requested - -  Would patient like information on creating a medical advance directive? - - - - - No - patient declined information No - patient declined information    Tobacco Social History   Tobacco Use  Smoking Status Former Smoker  . Last attempt to quit: 10/30/1978  . Years since quitting: 38.6  Smokeless Tobacco Never Used     Counseling given: Not Answered  Past Medical History:  Diagnosis Date  . Allergy   . Anxiety   . Asthma   . Attention deficit disorder without mention of  hyperactivity   . Colitis 12/05/2010  . Colon polyps   . Depression   . Diverticulosis   . GERD (gastroesophageal reflux disease)   . H/O hiatal hernia   . Hypertension   . IBS (irritable bowel syndrome)   . Osteoarthrosis, unspecified whether generalized or localized, unspecified site   . Other and unspecified hyperlipidemia   . Thyroid disease   . Ulcerative colitis   . Unspecified asthma(493.90)   . Unspecified essential hypertension    taken off meds since lost wt   Past Surgical History:  Procedure Laterality Date  . ABDOMINAL EXPOSURE N/A 04/22/2014   Procedure: ABDOMINAL EXPOSURE;  Surgeon: Angelia Mould, MD;  Location: [facility];  Service: Vascular;  Laterality: N/A;  . ANTERIOR LATERAL LUMBAR FUSION 4 LEVELS Left 04/22/2014   Procedure: Left Lumbar one/two, two/three, three/four, four,five. Anterior lateral lumbar interbody fusion**Stage 1**;  Surgeon: Erline Levine, MD;  Location: [place] [facility];  Service: Neurosurgery;  Laterality: Left;  . ANTERIOR LUMBAR FUSION N/A 04/22/2014   Procedure: Lumbar five/-Sacral one. Anterior lumbar interbody fusion with Dr. Scot Dock; Left Lumbar one/two, two/three/three/four, four/five. Anterior lateral lumbar interbody fusion**Stage 1**;  Surgeon: Erline Levine, MD;  Location: [facility];  Service: Neurosurgery;  Laterality: N/A;  . BACK SURGERY     fluid removed from between disks  . BREAST SURGERY     bil breast reduction   . CHOLECYSTECTOMY  Laparoscopic cholecystectomy  . COLONOSCOPY    . HIP FRACTURE SURGERY     left  11/01/2013  . LUMBAR PERCUTANEOUS PEDICLE SCREW 4 LEVEL N/A 04/25/2014   Procedure: **Stage 2** Percutaneous pedicle screw placement L1-5;  Surgeon: Erline Levine, MD;  Location: [place] [facility];  Service: Neurosurgery;  Laterality: N/A;  **Stage 2** Percutaneous pedicle screw placement L1-5  . SHOULDER SURGERY     right shoulder 3x   . TOE SURGERY     right big toe  . TOTAL SHOULDER ARTHROPLASTY    . UPPER  GASTROINTESTINAL ENDOSCOPY     Family History  Problem Relation Age of Onset  . Hypertension Father   . Colon cancer Father 60  . Arthritis Father   . Heart disease Father        CHF  . Heart disease Mother        Died, 12  . Healthy Brother   . Healthy Daughter   . Diabetes Neg Hx   . Breast cancer Neg Hx    Social History   Socioeconomic History  . Marital status: Married    Spouse name: None  . Number of children: 3  . Years of education: 48  . Highest education level: None  Social Needs  . Financial resource strain: Not hard at all  . Food insecurity - worry: Never true  . Food insecurity - inability: Never true  . Transportation needs - medical: No  . Transportation needs - non-medical: No  Occupational History  . Occupation: Psychologist, counselling: SELF EMPLOYED    Comment: retired  Tobacco Use  . Smoking status: Former Smoker    Last attempt to quit: 10/30/1978    Years since quitting: 38.6  . Smokeless tobacco: Never Used  Substance and Sexual Activity  . Alcohol use: Yes    Alcohol/week: 0.5 oz    Types: 1 Standard drinks or equivalent per week    Comment: once a week  . Drug use: No  . Sexual activity: Yes  Other Topics Concern  . None  Social History Narrative   [employer]; post-graduate study [employer].  married '68.  3 children: 2 daughters - '70, '72; 1 son '82; 6 grand-children. Lost her mother 2008, elderly father lives alone-in [place] died in September 11, 2022. owner/operator interior Agricultural consultant business, semi-retired. Lives with husband. Marriage is a work in progress      Caffeine - coffee 1 cup daily    Outpatient Encounter Medications as of 07/01/2017  Medication Sig  . ALPRAZolam (XANAX) 0.5 MG tablet Take 1 tablet (0.5 mg total) by mouth at bedtime as needed.  [name] amLODipine-valsartan (EXFORGE) 5-160 MG tablet Take 1 tablet by mouth daily. Follow-up appt is due must see provider for future refills  . buPROPion (WELLBUTRIN XL) 300 MG 24 hr  tablet Take 1 tablet (300 mg total) by mouth daily.  . Cholecalciferol (VITAMIN D PO) Take 1 tablet by mouth daily.  . CONCERTA 36 MG CR tablet Take 36 mg by mouth daily.   . Cranberry 1000 MG CAPS As directed  . Cyanocobalamin (VITAMIN B-12 PO) Take 2 tablets by mouth daily.  . diclofenac (VOLTAREN) 75 MG EC tablet   . FLUoxetine (PROZAC) 20 MG capsule Take 3 capsules (60 mg total) by mouth daily.  [name] HYDROmorphone (DILAUDID) 4 MG tablet Take 4 mg by mouth 2 (two) times daily as needed for moderate pain.   [name] inFLIXimab in sodium chloride 0.9 % Inject 5 mg/kg  into the vein every 3 (three) months. 4 vials of 100mg   . lamoTRIgine (LAMICTAL) 100 MG tablet Take 1 tablet by mouth 3 (three) times daily.  [name] loratadine (CLARITIN) 10 MG tablet Take 10 mg by mouth daily.  . Melatonin 5 MG TABS Take 10 mg by mouth at bedtime.  . Mesalamine (DELZICOL) 400 MG CPDR DR capsule Take 6 capsules (2,400 mg total) by mouth daily. (Patient taking differently: Take 1,200 mg by mouth daily. )  . pantoprazole (PROTONIX) 40 MG tablet TAKE ONE TABLET TWICE DAILY  . polymyxin B 500,000 Units in sodium chloride irrigation 0.9 % 500 mL Irrigate with as directed once.  [name] PROAIR HFA 108 (90 Base) MCG/ACT inhaler ONE PUFF FOUR TIMES DAILY AS NEEDED  . Probiotic Product (PROBIOTIC PO) Take 1 capsule by mouth daily.  [name] SYNTHROID 50 MCG tablet Take 2 tablets (100 mcg total) by mouth every morning.  . [DISCONTINUED] diclofenac (VOLTAREN) 75 MG EC tablet Take 1 tablet by mouth 2 (two) times daily.  . [DISCONTINUED] methocarbamol (ROBAXIN) 500 MG tablet Take 1 tablet (500 mg total) by mouth 4 (four) times daily. (Patient not taking: Reported on 07/01/2017)  . [DISCONTINUED] valACYclovir (VALTREX) 1000 MG tablet Take 1 tablet (1,000 mg total) by mouth 3 (three) times daily. (Patient not taking: Reported on 07/01/2017)   Facility-Administered Encounter Medications as of 07/01/2017  Medication  . 0.9 %  sodium chloride infusion     Activities of Daily Living In your present state of health, do you have any difficulty performing the following activities: 07/01/2017  Hearing? N  Vision? N  Difficulty concentrating or making decisions? N  Walking or climbing stairs? Y  Dressing or bathing? N  Doing errands, shopping? Y  Preparing Food and eating ? N  Using the Toilet? N  In the past six months, have you accidently leaked urine? N  Do you have problems with loss of bowel control? N  Managing your Medications? N  Managing your Finances? N  Housekeeping or managing your Housekeeping? Y  Some recent data might be hidden    Patient Care Team: Plotnikov, Evie Lacks, MD as PCP - General (Internal Medicine) Erline Levine, MD as Consulting Physician (Neurosurgery) [name], MD as Consulting Physician (Orthopedic Surgery) Alda Berthold, DO as Consulting Physician (Neurology) Irene Shipper, MD as Consulting Physician (Gastroenterology)    Assessment:   This is a routine wellness examination for Sydna. Physical assessment deferred to PCP.   Exercise Activities and Dietary recommendations Current Exercise Habits: The patient does not participate in regular exercise at present, Exercise limited by: orthopedic condition(s)  Diet (meal preparation, eat out, water intake, caffeinated beverages, dairy products, fruits and vegetables): in general, a "healthy" diet  , well balanced   Reviewed heart healthy diet, encouraged patient to increase daily water intake.   Goals    . Patient Stated    . Patient Stated     Increase physical activity by doing TV workouts, perhaps join the GYM or start Silver Sneakers.     . Weight (lb) < 150 lb (68 kg)       Fall Risk Fall Risk  07/01/2017 06/26/2016 08/08/2015 05/24/2015 01/26/2015  Falls in the past year? No Yes Yes Yes Yes  Number falls in past yr: - 2 or more 1 2 or more 1  Injury with Fall? - No Yes Yes Yes  Comment - - black eye concussion -  Risk for fall due to :  Impaired mobility;Impaired balance/gait  Impaired balance/gait;History of fall(s) Other (Comment) Impaired balance/gait;Impaired mobility -  Risk for fall due to: Comment - - chair got caught on step, so I went flying - -  Follow up - Education provided;Falls prevention discussed - - -    Depression Screen PHQ 2/9 Scores 07/01/2017 06/26/2016 01/26/2015  PHQ - 2 Score 1 0 1  PHQ- 9 Score 3 - -     Cognitive Function MMSE - Mini Mental State Exam 07/01/2017 06/26/2016 08/08/2015 05/24/2015  Orientation to time 5 5 4 4   Orientation to Place 5 5 5 5   Registration 3 3 3 3   Attention/ Calculation 5 3 3 2   Recall 2 3 1 1   Language- name 2 objects 2 2 2 2   Language- repeat 1 1 1 1   Language- follow 3 step command 3 3 3 3   Language- read & follow direction 1 1 1 1   Write a sentence 1 1 1 1   Copy design 1 1 1  0  Total score 29 28 25 23         Immunization History  Administered Date(s) Administered  . Influenza Whole 03/22/2009  . Influenza, Seasonal, Injecte, Preservative Fre 06/10/2012  . Influenza,inj,Quad PF,6+ Mos 05/23/2015  . Td 11/26/2004, 05/23/2015   Screening Tests Health Maintenance  Topic Date Due  . Hepatitis C Screening  1946-06-10  . INFLUENZA VACCINE  03/03/2018 (Originally 01/22/2017)  . PNA vac Low Risk Adult (1 of 2 - PCV13) 07/01/2018 (Originally 12/03/2010)  . MAMMOGRAM  08/21/2018  . COLONOSCOPY  10/09/2019  . TETANUS/TDAP  05/22/2025  . DEXA SCAN  Completed       Plan:    Continue doing brain stimulating activities (puzzles, reading, adult coloring books, staying active) to keep memory sharp.   Continue to eat heart healthy diet (full of fruits, vegetables, whole grains, lean protein, water--limit salt, fat, and sugar intake) and increase physical activity as tolerated.  I have personally reviewed and noted the following in the patient's chart:   . Medical and social history . Use of alcohol, tobacco or illicit drugs  . Current medications and  supplements . Functional ability and status . Nutritional status . Physical activity . Advanced directives . List of other physicians . Vitals . Screenings to include cognitive, depression, and falls . Referrals and appointments  In addition, I have reviewed and discussed with patient certain preventive protocols, quality metrics, and best practice recommendations. A written personalized care plan for preventive services as well as general preventive health recommendations were provided to patient.     Michiel Cowboy, RN  07/01/2017    Medical screening examination/treatment/procedure(s) were performed by non-physician practitioner and as supervising physician I was immediately available for consultation/collaboration. I agree with above. Lew Dawes, MD

## 2017-06-30 ENCOUNTER — Telehealth: Payer: Self-pay

## 2017-06-30 NOTE — Telephone Encounter (Signed)
Patient stated she was returning a call from our office and she doesn't know why we called.

## 2017-07-01 ENCOUNTER — Telehealth: Payer: Self-pay | Admitting: *Deleted

## 2017-07-01 ENCOUNTER — Ambulatory Visit (INDEPENDENT_AMBULATORY_CARE_PROVIDER_SITE_OTHER): Payer: Medicare Other | Admitting: *Deleted

## 2017-07-01 VITALS — BP 138/74 | HR 76 | Resp 20 | Ht 65.0 in | Wt 162.0 lb

## 2017-07-01 DIAGNOSIS — Z0000 Encounter for general adult medical examination without abnormal findings: Secondary | ICD-10-CM

## 2017-07-01 MED ORDER — METHOCARBAMOL 500 MG PO TABS: 500.0000 mg | ORAL_TABLET | Freq: Three times a day (TID) | ORAL | 3 refills | Status: DC | PRN

## 2017-07-01 MED ORDER — DICLOFENAC SODIUM 50 MG PO TBEC
50.0000 mg | DELAYED_RELEASE_TABLET | Freq: Two times a day (BID) | ORAL | 2 refills | Status: DC | PRN
Start: 2017-07-01 — End: 2017-07-16

## 2017-07-01 NOTE — Patient Instructions (Addendum)
Classical Stretch: [facility] Technique Tuesday, October 23, 05:30 am on [employer] Duration: 0:26:46 Description: CLASSICAL STRETCH: THE [facility] TECHNIQUE focuses on overall wellness and physical fitness. This series, hosted by [employer], features a graceful, fluid and controlled method of stretching the entire body. It combines yoga, tai chi, Pilates and ballet techniques, plus specific movements and stretches that reach muscles and ligaments not normally targeted in the average fitness program. The movements, designed in consultation with a physician and a physiotherapist, are simple, safe, effective and appropriate for all ages and fitness levels. [HD][CC][DVS] Broadcast In: [place] Website: [email or web address].org/  Check out Smart TV or do You-Tube on Computer to stream in exercise select chair exercises.   Continue doing brain stimulating activities (puzzles, reading, adult coloring books, staying active) to keep memory sharp.   Continue to eat heart healthy diet (full of fruits, vegetables, whole grains, lean protein, water--limit salt, fat, and sugar intake) and increase physical activity as tolerated.   [name] , Thank you for taking time to come for your Medicare Wellness Visit. I appreciate your ongoing commitment to your health goals. Please review the following plan we discussed and let me know if I can assist you in the future.   These are the goals we discussed: Goals    . Patient Stated    . Patient Stated     Increase physical activity by doing TV workouts, perhaps join the GYM or start Silver Sneakers.     . Weight (lb) < 150 lb (68 kg)       This is a list of the screening recommended for you and due dates:  Health Maintenance  Topic Date Due  .  Hepatitis C: One time screening is recommended by Center for Disease Control  (CDC) for  adults born from [birth year] through 1965.   10-07-1945  . Flu Shot  03/03/2018*  . Pneumonia vaccines (1 of 2 - PCV13)  07/01/2018*  . Mammogram  08/21/2018  . Colon Cancer Screening  10/09/2019  . Tetanus Vaccine  05/22/2025  . DEXA scan (bone density measurement)  Completed  *Topic was postponed. The date shown is not the original due date.

## 2017-07-01 NOTE — Telephone Encounter (Signed)
Done. Thx.

## 2017-07-01 NOTE — Telephone Encounter (Signed)
During AWV  Patient asked if PCP would refill diclofenac. She also states that Dr. Vertell Limber is no longer caring for her. He was prescribing robaxin to help with muscle spasms. Patient asked if PCP would start to prescribe this medication for her, she ran out several weeks ago.

## 2017-07-03 ENCOUNTER — Encounter: Payer: Self-pay | Admitting: Physical Medicine & Rehabilitation

## 2017-07-03 ENCOUNTER — Ambulatory Visit (HOSPITAL_BASED_OUTPATIENT_CLINIC_OR_DEPARTMENT_OTHER): Payer: Medicare Other | Admitting: Physical Medicine & Rehabilitation

## 2017-07-03 ENCOUNTER — Encounter: Payer: Medicare Other | Attending: Physical Medicine & Rehabilitation

## 2017-07-03 ENCOUNTER — Other Ambulatory Visit: Payer: Self-pay

## 2017-07-03 VITALS — BP 133/80 | HR 72

## 2017-07-03 DIAGNOSIS — M5416 Radiculopathy, lumbar region: Secondary | ICD-10-CM | POA: Diagnosis not present

## 2017-07-03 DIAGNOSIS — M961 Postlaminectomy syndrome, not elsewhere classified: Secondary | ICD-10-CM | POA: Diagnosis not present

## 2017-07-03 NOTE — Progress Notes (Signed)
Subjective:    Patient ID: [name], female    DOB: Sep 28, 1945, 71 y.o.   MRN: [id number]  HPI  CC:  Pain in Right thigh and leg Sitting and lay in gbed worsen pain 72 yo female with hx of lumbar stenosis who underwent, L1-L5  Fusion 2015 Left hip ORIF in [place] did ESI ~70yrago  PCP Dr PAlain MarionRight shoulder replacement Dr NVeverly Fells 2014 limited ROM  Uses can for amb  Mod I ADL Falls if not paying attention to feet and not using cane Used to walk and work out currently not doing this  Left shoulder bursitis  Bilateral knee arthritis- uses Biofreeze  [facility] Was told by Dr OAlvan Damethat she needs B knee replacement and poss Righ thip replacement  Takes tramadol or tylenol for pain ~1 mo Pain Inventory Average Pain 4 Pain Right Now 6 My pain is dull  In the last 24 hours, has pain interfered with the following? General activity 1 Relation with others 1 Enjoyment of life 1 What TIME of day is your pain at its worst? night Sleep (in general) Fair  Pain is worse with: sitting Pain improves with: injections Relief from Meds: 5  Mobility walk with assistance use a cane how many minutes can you walk? 10 ability to climb steps?  yes do you drive?  yes Do you have any goals in this area?  yes  Function Do you have any goals in this area?  no  Neuro/Psych weakness numbness tingling anxiety  Prior Studies Any changes since last visit?  no  Physicians involved in your care Any changes since last visit?  no   Family History  Problem Relation Age of Onset  . Hypertension Father   . Colon cancer Father 913 . Arthritis Father   . Heart disease Father        CHF  . Heart disease Mother        Died, 963 . Healthy Brother   . Healthy Daughter   . Diabetes Neg Hx   . Breast cancer Neg Hx    Social History   Socioeconomic History  . Marital status: Married    Spouse name: None  . Number  of children: 3  . Years of education: 163 . Highest education level: None  Social Needs  . Financial resource strain: Not hard at all  . Food insecurity - worry: Never true  . Food insecurity - inability: Never true  . Transportation needs - medical: No  . Transportation needs - non-medical: No  Occupational History  . Occupation: iPsychologist, counselling SELF EMPLOYED    Comment: retired  Tobacco Use  . Smoking status: Former Smoker    Last attempt to quit: 10/30/1978    Years since quitting: 38.7  . Smokeless tobacco: Never Used  Substance and Sexual Activity  . Alcohol use: Yes    Alcohol/week: 0.5 oz    Types: 1 Standard drinks or equivalent per week    Comment: once a week  . Drug use: No  . Sexual activity: Yes  Other Topics Concern  . None  Social History Narrative   [employer] post-graduate study [employer]  married '68.  3 children: 2 daughters - '70, '72; 1 son '82; 6 grand-children. 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 Lives with husband. Marriage  is a work in progress      Caffeine - coffee 1 cup daily   Past Surgical History:  Procedure Laterality Date  . ABDOMINAL EXPOSURE N/A 04/22/2014   Procedure: ABDOMINAL EXPOSURE;  Surgeon: Angelia Mould, MD;  Location: [place] [facility];  Service: Vascular;  Laterality: N/A;  . ANTERIOR LATERAL LUMBAR FUSION 4 LEVELS Left 04/22/2014   Procedure: Left Lumbar one/two, two/three, three/four, four,five. Anterior lateral lumbar interbody fusion**Stage 1**;  Surgeon: Erline Levine, MD;  Location: [place] [facility];  Service: Neurosurgery;  Laterality: Left;  . ANTERIOR LUMBAR FUSION N/A 04/22/2014   Procedure: Lumbar five/-Sacral one. Anterior lumbar interbody fusion with Dr. Scot Dock; Left Lumbar one/two, two/three/three/four, four/five. Anterior lateral lumbar interbody fusion**Stage 1**;  Surgeon: Erline Levine, MD;   Location: [place] [facility];  Service: Neurosurgery;  Laterality: N/A;  . BACK SURGERY     fluid removed from between disks  . BREAST SURGERY     bil breast reduction   . CHOLECYSTECTOMY     Laparoscopic cholecystectomy  . COLONOSCOPY    . HIP FRACTURE SURGERY     left  11/01/2013  . LUMBAR PERCUTANEOUS PEDICLE SCREW 4 LEVEL N/A 04/25/2014   Procedure: **Stage 2** Percutaneous pedicle screw placement L1-5;  Surgeon: Erline Levine, MD;  Location: [place] [facility];  Service: Neurosurgery;  Laterality: N/A;  **Stage 2** Percutaneous pedicle screw placement L1-5  . SHOULDER SURGERY     right shoulder 3x   . TOE SURGERY     right big toe  . TOTAL SHOULDER ARTHROPLASTY    . UPPER GASTROINTESTINAL ENDOSCOPY     Past Medical History:  Diagnosis Date  . Allergy   . Anxiety   . Asthma   . Attention deficit disorder without mention of hyperactivity   . Colitis 12/05/2010  . Colon polyps   . Depression   . Diverticulosis   . GERD (gastroesophageal reflux disease)   . H/O hiatal hernia   . Hypertension   . IBS (irritable bowel syndrome)   . Osteoarthrosis, unspecified whether generalized or localized, unspecified site   . Other and unspecified hyperlipidemia   . Thyroid disease   . Ulcerative colitis   . Unspecified asthma(493.90)   . Unspecified essential hypertension    taken off meds since lost wt   There were no vitals taken for this visit.  Opioid Risk Score:  6 Fall Risk Score:  `1  Depression screen PHQ 2/9  Depression screen [facility] 2/9 07/03/2017 07/03/2017 07/01/2017 06/26/2016 01/26/2015  Decreased Interest 1 0 0 0 1  Down, Depressed, Hopeless 1 0 1 0 0  PHQ - 2 Score 2 0 1 0 1  Altered sleeping 1 - 0 - -  Tired, decreased energy 1 - 2 - -  Change in appetite 0 - 0 - -  Feeling bad or failure about yourself  0 - 0 - -  Trouble concentrating 0 - 0 - -  Moving slowly or fidgety/restless 0 - 0 - -  Suicidal thoughts 0 - 0 - -  PHQ-9 Score 4 - 3 - -  Difficult doing work/chores Not  difficult at all - Not difficult at all - -  Some recent data might be hidden      Review of Systems  Constitutional: Negative.   HENT: Negative.   Eyes: Negative.   Respiratory: Negative.   Cardiovascular: Negative.   Gastrointestinal: Negative.   Endocrine: Negative.   Genitourinary: Negative.   Musculoskeletal: Negative.   Skin: Negative.  Allergic/Immunologic: Negative.   Neurological: Negative.   Hematological: Negative.   Psychiatric/Behavioral: Negative.        Objective:   Physical Exam    General: No acute distress Mood and affect are appropriate Heart: Regular rate and rhythm no rubs murmurs or extra sounds Lungs: Clear to auscultation, breathing unlabored, no rales or wheezes Abdomen: Positive bowel sounds, soft nontender to palpation, nondistended Extremities: No clubbing, cyanosis, or edema Skin: No evidence of breakdown, no evidence of rash, healed surgical incisions in the lumbar spine, left hip Neurologic: Cranial nerves II through XII intact, motor strength is 5/5 in bilateral deltoid, bicep, tricep, grip, hip flexor, knee extensors, ankle dorsiflexor and plantar flexor Sensory exam Reduce sensory R L4,5,S1 Cerebellar exam normal finger to nose to finger as well as heel to shin in bilateral upper and lower extremities Musculoskeletal: Reduce range of motion right shoulder with deformity from right shoulder replacement.. No joint swelling, valgus deformity left knee Ambulates without evidence of toe drag or knee instability.  She does have slightly wide gait    Assessment & Plan:  #1.  Lumbar postlaminectomy syndrome with chronic pain she has both axial pain as well as pain that radiates into the lower extremities.  Right side is more symptomatic than left side. She has had good relief with epidural steroids in the past and would like to resume treatment.  Will get records from [place] [facility], Dr. Maryjean Ka She is not requesting any pain medications  at the current time she uses them very sparingly and mainly is using over-the-counter medication such as Tylenol.  2.  History of left hip ORIF does have some limitation in range of motion but no pain

## 2017-07-03 NOTE — Patient Instructions (Signed)
Make sure you have a driver for next visit

## 2017-07-04 NOTE — Progress Notes (Signed)
Dr. Letta Pate determined that a drug screen was not necessary for todays visit

## 2017-07-10 ENCOUNTER — Encounter: Payer: Self-pay | Admitting: Internal Medicine

## 2017-07-16 ENCOUNTER — Other Ambulatory Visit: Payer: Self-pay | Admitting: Internal Medicine

## 2017-07-16 MED ORDER — DICLOFENAC-MISOPROSTOL 75-0.2 MG PO TBEC: 1.0000 | DELAYED_RELEASE_TABLET | Freq: Two times a day (BID) | ORAL | 3 refills | Status: DC | PRN

## 2017-07-25 NOTE — Telephone Encounter (Signed)
Caller name: Yaire Kreher Relationship to patient: self Can be reached: [phone] Pharmacy:  [place], [place] - [address] [phone] (Phone) [phone] (Fax)   Reason for call: Diclofenac-Misoprostol 75-0.2 MG TBEC on 07/16/17 Med was sent to wrong pharmacy.  Please resend to [employer] [facility].

## 2017-07-30 ENCOUNTER — Encounter: Payer: Self-pay | Admitting: Internal Medicine

## 2017-07-30 ENCOUNTER — Telehealth: Payer: Self-pay

## 2017-07-30 MED ORDER — DICLOFENAC-MISOPROSTOL 75-0.2 MG PO TBEC: 1.0000 | DELAYED_RELEASE_TABLET | Freq: Two times a day (BID) | ORAL | 3 refills | Status: DC | PRN

## 2017-07-30 NOTE — Telephone Encounter (Signed)
RX sent  Copied from [place] [phone]. Topic: General - Other >> Jul 30, 2017  1:43 PM Yvette Rack wrote: Reason for CRM: pt calling stating that the Diclofenac-Misoprostol 75-0.2 MG TBEC was sent to the wrong pharmacy and would like for it to go to the [facility], [place] - [address] [phone] (Phone) [phone] (Fax)

## 2017-07-31 ENCOUNTER — Encounter: Payer: Medicare Other | Attending: Physical Medicine & Rehabilitation

## 2017-07-31 ENCOUNTER — Encounter: Payer: Self-pay | Admitting: Physical Medicine & Rehabilitation

## 2017-07-31 ENCOUNTER — Ambulatory Visit (HOSPITAL_BASED_OUTPATIENT_CLINIC_OR_DEPARTMENT_OTHER): Payer: Medicare Other | Admitting: Physical Medicine & Rehabilitation

## 2017-07-31 ENCOUNTER — Encounter: Payer: Self-pay | Admitting: Internal Medicine

## 2017-07-31 VITALS — BP 132/79 | HR 73 | Resp 14

## 2017-07-31 DIAGNOSIS — M25551 Pain in right hip: Secondary | ICD-10-CM

## 2017-07-31 DIAGNOSIS — M5416 Radiculopathy, lumbar region: Secondary | ICD-10-CM | POA: Insufficient documentation

## 2017-07-31 NOTE — Patient Instructions (Addendum)
You received an epidural steroid injection under fluoroscopic guidance. This is the most accurate way to perform an epidural injection. This injection was performed to relieve thigh or leg or foot pain that may be related to a pinched nerve in the lumbar spine. The local anesthetic injected today may cause numbness in your leg for a couple hours. If it is severe we may need to observe you for 30-60 minutes after the injection. The cortisone medicine injected today may take several days to take full effect. This medicine can also cause facial flushing or feeling of being warm.  This injection may last for days weeks or months. It can be repeated if needed. If it is not effective, another spinal level may need to be injected. Other treatments include medication management as well as physical therapy. In some cases surgery may be an option.  Please call to schedule Right hip injection with ultrasound if you don't get adequate relief with epidural

## 2017-07-31 NOTE — Progress Notes (Signed)
  PROCEDURE RECORD [facility]   Name: [name] DOB:[birth year] MRN: [id number]  Date:07/31/2017  Physician: Alysia Penna, MD    Nurse/CMA: Dorean Daniello, CMA   Allergies:  Allergies  Allergen Reactions  . Ampicillin Nausea And Vomiting  . Erythromycin Nausea And Vomiting    Can take Z pac  . Hctz [Hydrochlorothiazide]     dizzy  . Metoprolol     doublevision   . Morphine And Related Nausea And Vomiting  . Penicillin G Other (See Comments)    Consent Signed: Yes.    Is patient diabetic? No.  CBG today?   Pregnant: No. LMP: No LMP recorded. Patient is postmenopausal. (age 43-55)  Anticoagulants: no Anti-inflammatory: no Antibiotics: no  Procedure: transforaminal epidural steroid injection  Position: Prone Start Time: 11:17am  End Time: 11:22am  Fluoro Time: 20s  RN/CMA Jerrett Baldinger, CMA Graciano Batson, CMA    Time 11:00am 11:26am    BP 132/79 152/87    Pulse 73 72    Respirations 14 14    O2 Sat 96 95    S/S 6 6    Pain Level 4/10 1/10     D/C home with husbvand, patient A & O X 3, D/C instructions reviewed, and sits independently.

## 2017-07-31 NOTE — Progress Notes (Signed)
Right S1 transforaminal epidural steroid injection under fluoroscopic guidance  Indication: Lumbosacral radiculitis is not relieved by medication management or other conservative care and interfering with self-care and mobility.   Informed consent was obtained after describing risk and benefits of the procedure with the patient, this includes bleeding, bruising, infection, paralysis and medication side effects.  The patient wishes to proceed and has given written consent.  Patient was placed in prone position.  The lumbar area was marked and prepped with Betadine.  It was entered with a 25-gauge 1-1/2 inch needle and one mL of 1% lidocaine was injected into the skin and subcutaneous tissue.  Then a 22-gauge 3.5in spinal needle was inserted into the RIght S1  foramen under AP, lateral, and oblique view.  Then a solution containing one mL of 10 mg per mL dexamethasone and 2 mL of 1% lidocaine was injected.  The patient tolerated procedure well.  Post procedure instructions were given.  Please see post procedure form.

## 2017-08-04 ENCOUNTER — Other Ambulatory Visit: Payer: Self-pay | Admitting: Podiatry

## 2017-08-04 ENCOUNTER — Encounter: Payer: Self-pay | Admitting: Podiatry

## 2017-08-04 ENCOUNTER — Ambulatory Visit (INDEPENDENT_AMBULATORY_CARE_PROVIDER_SITE_OTHER): Payer: Medicare Other

## 2017-08-04 ENCOUNTER — Ambulatory Visit (INDEPENDENT_AMBULATORY_CARE_PROVIDER_SITE_OTHER): Payer: Medicare Other | Admitting: Podiatry

## 2017-08-04 DIAGNOSIS — M79671 Pain in right foot: Secondary | ICD-10-CM | POA: Diagnosis not present

## 2017-08-04 DIAGNOSIS — M79672 Pain in left foot: Secondary | ICD-10-CM

## 2017-08-04 DIAGNOSIS — S99922A Unspecified injury of left foot, initial encounter: Secondary | ICD-10-CM | POA: Diagnosis not present

## 2017-08-04 NOTE — Telephone Encounter (Signed)
Called [facility] to get PA information. [facility] [phone].. ID # [id number]. Called [facility] spoke w/rep she will start PA, but must be completed on cover-m-meds. Key# [id number]. COMPLETED PA ON COVER-MY-MED PA Case ID: [id number] S.  WAITING ON APPROVAL STATUS.[name]/LMB

## 2017-08-04 NOTE — Progress Notes (Signed)
Subjective:   Patient ID: [name], female   DOB: 72 y.o.   MRN: [id number]   HPI Patient states she stepped on a tree branch the other day and she is worried that she has broken her left foot   ROS      Objective:  Physical Exam  Neurovascular status intact with inflammation base of fifth metatarsal left with bruising noted and discomfort with palpation     Assessment:  Traumatized left base of fifth metatarsal with possibility for fracture reviewed x-ray advised on surgical shoe ice therapy and reappoint if symptoms persist     Plan:    X-rays indicate no indications that there is a fracture at the current time

## 2017-08-04 NOTE — Telephone Encounter (Signed)
Received response back from insurance PA Response - Approved. Notified pharmacist ( [name]) with approval status. Sent pt a response back via mychart of PA status.Marland KitchenJohny [name]

## 2017-08-04 NOTE — Telephone Encounter (Signed)
Plotnikov, Evie Lacks, MD  to Me       11:43 AM  Lorre Nick  Can we PA Diclof-Mesprost Rx, please?  Thx  July 30, 2017

## 2017-08-18 ENCOUNTER — Encounter: Payer: Self-pay | Admitting: Internal Medicine

## 2017-08-19 DIAGNOSIS — K5190 Ulcerative colitis, unspecified, without complications: Secondary | ICD-10-CM | POA: Diagnosis not present

## 2017-08-26 ENCOUNTER — Telehealth: Payer: Self-pay

## 2017-08-26 NOTE — Telephone Encounter (Signed)
According to injection note pt's pain went from 4/10 to 1/10.  She will need MD visit for reassessment and decide either to repeat, try another level or repeat imaging

## 2017-08-26 NOTE — Telephone Encounter (Signed)
Patient called today, states her injection that she recieved on 07-31-2017 is not working  Please advise.

## 2017-08-28 NOTE — Telephone Encounter (Signed)
Contacted patient and conveyed Dr. Letta Pate message.  Encouraged patient to follow through on hip x-ray and then contact our office for follow up with Dr. Letta Pate

## 2017-08-29 ENCOUNTER — Ambulatory Visit
Admission: RE | Admit: 2017-08-29 | Discharge: 2017-08-29 | Disposition: A | Discharge status: Home / Self Care | Payer: Medicare Other | Source: Ambulatory Visit | Attending: Physical Medicine & Rehabilitation | Admitting: Physical Medicine & Rehabilitation

## 2017-08-29 DIAGNOSIS — M1611 Unilateral primary osteoarthritis, right hip: Secondary | ICD-10-CM | POA: Diagnosis not present

## 2017-08-29 DIAGNOSIS — M25551 Pain in right hip: Secondary | ICD-10-CM

## 2017-09-12 ENCOUNTER — Telehealth: Payer: Self-pay | Admitting: *Deleted

## 2017-09-12 NOTE — Telephone Encounter (Signed)
Patient returning missed phone call. No recent outgoing call record

## 2017-09-26 ENCOUNTER — Ambulatory Visit: Payer: Medicare Other | Admitting: Physical Medicine & Rehabilitation

## 2017-09-30 ENCOUNTER — Ambulatory Visit (HOSPITAL_BASED_OUTPATIENT_CLINIC_OR_DEPARTMENT_OTHER): Payer: Medicare Other | Admitting: Physical Medicine & Rehabilitation

## 2017-09-30 ENCOUNTER — Encounter: Payer: Self-pay | Admitting: Physical Medicine & Rehabilitation

## 2017-09-30 ENCOUNTER — Encounter: Payer: Medicare Other | Attending: Physical Medicine & Rehabilitation

## 2017-09-30 VITALS — BP 125/76 | HR 71 | Ht 65.0 in | Wt 161.0 lb

## 2017-09-30 DIAGNOSIS — M961 Postlaminectomy syndrome, not elsewhere classified: Secondary | ICD-10-CM

## 2017-09-30 DIAGNOSIS — M533 Sacrococcygeal disorders, not elsewhere classified: Secondary | ICD-10-CM

## 2017-09-30 DIAGNOSIS — M5416 Radiculopathy, lumbar region: Secondary | ICD-10-CM | POA: Diagnosis not present

## 2017-09-30 NOTE — Patient Instructions (Addendum)
You have multifactorial pain issue, the S1 epidural gave you temporary relief of some sciatic pain The sacroiliac injection should help with some buttock pain While the right hip arthritis is moderately severe, I would expect more groin pain rather than buttocks and thigh pain.  Also you have little pain with internal rotation. I would hold off on a hip joint injection unless the other injections are not helpful.

## 2017-09-30 NOTE — Progress Notes (Signed)
  PROCEDURE RECORD [facility]   Name: [name] DOB:01-09-1946 MRN: [id number]  Date:09/30/2017  Physician: Alysia Penna, MD    Nurse/CMA: Bright CMA  Allergies:  Allergies  Allergen Reactions  . Ampicillin Nausea And Vomiting  . Erythromycin Nausea And Vomiting    Can take Z pac  . Hctz [Hydrochlorothiazide]     dizzy  . Metoprolol     doublevision   . Morphine And Related Nausea And Vomiting  . Penicillin G Other (See Comments)    Consent Signed: Yes.    Is patient diabetic? No.  CBG today? NA  Pregnant: No. LMP: No LMP recorded. Patient is postmenopausal. (age 8-55)  Anticoagulants: no Anti-inflammatory: no Antibiotics: no  Procedure: Right Sacroiliac inj Position: Prone   Start Time: 1102am  End Time: 1108am  Fluoro Time: 18s  RN/CMA Bright CMA Bright CMA    Time 1028am 1113am    BP 125/76 154/78    Pulse 71 71    Respirations 16 16    O2 Sat 95 95    S/S 6 6    Pain Level 3/10 0/10     D/C home with Self, patient A & O X 3, D/C instructions reviewed, and sits independently.

## 2017-09-30 NOTE — Progress Notes (Signed)
Right sacroiliac injection under fluoroscopic guidance  Indication: Right Low back and buttocks pain not relieved by medication management and other conservative care.  Informed consent was obtained after describing risks and benefits of the procedure with the patient, this includes bleeding, bruising, infection, paralysis and medication side effects. The patient wishes to proceed and has given written consent. The patient was placed in a prone position. The lumbar and sacral area was marked and prepped with Betadine. A 25-gauge 1-1/2 inch needle was inserted into the skin and subcutaneous tissue and 1 mL of 1% lidocaine was injected. Then a 25-gauge 3 inch spinal needle was inserted under fluoroscopic guidance into the Right sacroiliac joint. AP and lateral images were utilized. Omnipaque 180x0.5 mL under live fluoroscopy demonstrated no intravascular uptake. Then a solution containing one ML of 6 mgper ml betamethasone and 2 ML of 1% lidocaine MPF was injected x1.5 mL. Patient tolerated the procedure well. Post procedure instructions were given. Please see post procedure form.

## 2017-10-07 ENCOUNTER — Other Ambulatory Visit: Payer: Self-pay | Admitting: Internal Medicine

## 2017-10-11 ENCOUNTER — Other Ambulatory Visit: Payer: Self-pay | Admitting: Internal Medicine

## 2017-10-13 ENCOUNTER — Telehealth: Payer: Self-pay | Admitting: Physical Medicine & Rehabilitation

## 2017-10-13 NOTE — Telephone Encounter (Signed)
Short term relief docum,ented , needs L5 dorsal ramus S1,2,3 lateral branch block

## 2017-10-13 NOTE — Telephone Encounter (Signed)
Patient left voicemail 04.19.19 at 11:35 states no relief from injection 10 days past - what can she do at this point [phone] req phone call

## 2017-10-24 ENCOUNTER — Other Ambulatory Visit: Payer: Self-pay | Admitting: Internal Medicine

## 2017-10-24 NOTE — Telephone Encounter (Signed)
Copied from [place] [id number]. Topic: Quick Communication - Rx Refill/Question >> Oct 24, 2017  1:35 PM Oliver Pila B wrote: Medication: traMADol (ULTRAM) 50 MG tablet [[id number]]  DISCONTINUED  Has the patient contacted their pharmacy? Yes.   (Agent: If no, request that the patient contact the pharmacy for the refill.) Preferred Pharmacy (with phone number or street name): [facility] Agent: Please be advised that RX refills may take up to 3 business days. We ask that you follow-up with your pharmacy.

## 2017-10-25 NOTE — Telephone Encounter (Signed)
LOV 04/23/17 With Dr. Alain Marion / Patient requesting refill of tramadol.  This is an historical medication written in 2017.  Last refilled in 2017 /

## 2017-10-27 DIAGNOSIS — K5190 Ulcerative colitis, unspecified, without complications: Secondary | ICD-10-CM | POA: Diagnosis not present

## 2017-10-28 ENCOUNTER — Ambulatory Visit: Payer: Medicare Other | Admitting: Physical Medicine & Rehabilitation

## 2017-10-28 DIAGNOSIS — F3175 Bipolar disorder, in partial remission, most recent episode depressed: Secondary | ICD-10-CM | POA: Diagnosis not present

## 2017-10-28 DIAGNOSIS — F900 Attention-deficit hyperactivity disorder, predominantly inattentive type: Secondary | ICD-10-CM | POA: Diagnosis not present

## 2017-10-30 ENCOUNTER — Ambulatory Visit: Payer: Medicare Other | Admitting: Physical Medicine & Rehabilitation

## 2017-10-30 ENCOUNTER — Ambulatory Visit: Payer: Medicare Other

## 2017-11-03 ENCOUNTER — Encounter: Payer: Self-pay | Admitting: Physical Medicine & Rehabilitation

## 2017-11-03 ENCOUNTER — Encounter: Payer: Medicare Other | Attending: Physical Medicine & Rehabilitation

## 2017-11-03 ENCOUNTER — Ambulatory Visit (HOSPITAL_BASED_OUTPATIENT_CLINIC_OR_DEPARTMENT_OTHER): Payer: Medicare Other | Admitting: Physical Medicine & Rehabilitation

## 2017-11-03 VITALS — BP 121/76 | HR 70 | Resp 14 | Ht 64.0 in | Wt 162.0 lb

## 2017-11-03 DIAGNOSIS — M533 Sacrococcygeal disorders, not elsewhere classified: Secondary | ICD-10-CM | POA: Diagnosis not present

## 2017-11-03 DIAGNOSIS — M5416 Radiculopathy, lumbar region: Secondary | ICD-10-CM | POA: Insufficient documentation

## 2017-11-03 NOTE — Progress Notes (Signed)
Right L5 dorsal ramus S1-S2-S3 lateral branch blocks under fluoroscopic guidance Left side  Informed consent was obtained after describing risks and benefits of the procedure with patient these include bleeding bruising and infection.  He elects to proceed and has given written consent.  Patient placed prone on fluoroscopy table Betadine prep sterile drape a 25-gauge 1.5 inch needle was used to anesthetize skin and subcu tissue with 1% lidocaine 1 cc into each of 4 sites.  Then a 22-gauge 3.5" needle was inserted under fluoroscopic guidance for starting the S1 SAP sacral ala junction.  Bone contact made.  Isovue 200 x 0.5 mL demonstrated no intravascular uptake then 0.5 mL of 2% lidocaine was injected.  Then the lateral aspect of the S1, S2, S3 foramen was targeted.  Bone contact made out, Isovue-200 times 0.5 mL demonstrated no nerve root or intravascular uptake within 0.5 mL of 2% lidocaine solution was injected after negative drawback for blood.  Patient tolerated procedure well.  Postinjection instructions given.

## 2017-11-03 NOTE — Patient Instructions (Signed)
We blocked the nerves to the right sacroiliac joint.  THis is to help relieve low back and buttocks pain.  If this helps only temporarily we can do a radiofrequency neurotomy of the same nerves to get a longer lasting effect  THis injection will not help the Right leg sciatic pain, for this we will schedule a repeat Right S1 transforaminal epidural injection which was last performed in February 2019

## 2017-11-03 NOTE — Progress Notes (Signed)
  PROCEDURE RECORD [facility]   Name: [name] DOB:December 22, 1945 MRN: [id number]  Date:11/03/2017  Physician: Alysia Penna, MD    Nurse/CMA: Odus Clasby, CMA  Allergies:  Allergies  Allergen Reactions  . Ampicillin Nausea And Vomiting  . Erythromycin Nausea And Vomiting    Can take Z pac  . Hctz [Hydrochlorothiazide]     dizzy  . Metoprolol     doublevision   . Morphine And Related Nausea And Vomiting  . Penicillin G Other (See Comments)    Consent Signed: Yes.    Is patient diabetic? No.  CBG today?   Pregnant: No. LMP: No LMP recorded. Patient is postmenopausal. (age 14-55)  Anticoagulants: no Anti-inflammatory: yes (tylenol) Antibiotics: no  Procedure: right L5, S1-3 lateral branch block  Position: Prone Start Time: 10:42 am  End Time: 10:52am  Fluoro Time: 42  RN/CMA Elfida Shimada, CMA Ferrell Flam, CMA    Time 10:25am 10:58am    BP 121/76 149/78    Pulse 70 71    Respirations 14 14    O2 Sat 97 97    S/S 6 6    Pain Level 8/10 2     D/C home with husband, patient A & O X 3, D/C instructions reviewed, and sits independently.

## 2017-11-05 ENCOUNTER — Telehealth: Payer: Self-pay

## 2017-11-05 NOTE — Telephone Encounter (Signed)
We were not next to any spinal nerves so this is just some radiating pain from local subcutaneous irritation.  Would cont heat alternating with ice.  May use Ben gay or similar cream

## 2017-11-05 NOTE — Telephone Encounter (Signed)
Called patient and relayed message.

## 2017-11-05 NOTE — Telephone Encounter (Signed)
Patient called yesterday, stated that she has had an increase in pain along the injection sites from the procedure she had 2 days ago.  States after the injections has had sharp, radiating pain along the sites and down into the leg.  Called her this morning for more information and she stated it is slightly better but still having considerable pain on areas.  I informed her to rest as much as she can as to not aggravate the areas further and to apply ice until we get a response form provider as to what would be the next step

## 2017-11-10 ENCOUNTER — Ambulatory Visit: Payer: Medicare Other | Admitting: Internal Medicine

## 2017-11-19 ENCOUNTER — Encounter: Payer: Self-pay | Admitting: Internal Medicine

## 2017-11-19 DIAGNOSIS — M25562 Pain in left knee: Secondary | ICD-10-CM | POA: Diagnosis not present

## 2017-11-19 DIAGNOSIS — M25552 Pain in left hip: Secondary | ICD-10-CM | POA: Diagnosis not present

## 2017-11-19 DIAGNOSIS — M25551 Pain in right hip: Secondary | ICD-10-CM | POA: Diagnosis not present

## 2017-11-19 DIAGNOSIS — M25561 Pain in right knee: Secondary | ICD-10-CM | POA: Diagnosis not present

## 2017-11-21 NOTE — Telephone Encounter (Signed)
[name],  She will need to contact her orthopedist or wait until Dr. Alain Marion is back; for legal purposes, can't refill Tramadol Rx that is 73 years old without OV;  Thanks- LWM

## 2017-11-26 DIAGNOSIS — M25562 Pain in left knee: Secondary | ICD-10-CM | POA: Diagnosis not present

## 2017-11-26 DIAGNOSIS — S72142D Displaced intertrochanteric fracture of left femur, subsequent encounter for closed fracture with routine healing: Secondary | ICD-10-CM | POA: Diagnosis not present

## 2017-11-26 DIAGNOSIS — M7061 Trochanteric bursitis, right hip: Secondary | ICD-10-CM | POA: Diagnosis not present

## 2017-11-26 DIAGNOSIS — M25561 Pain in right knee: Secondary | ICD-10-CM | POA: Diagnosis not present

## 2017-11-26 DIAGNOSIS — M25551 Pain in right hip: Secondary | ICD-10-CM | POA: Diagnosis not present

## 2017-11-26 DIAGNOSIS — M25552 Pain in left hip: Secondary | ICD-10-CM | POA: Diagnosis not present

## 2017-11-26 DIAGNOSIS — M1611 Unilateral primary osteoarthritis, right hip: Secondary | ICD-10-CM | POA: Diagnosis not present

## 2017-12-04 ENCOUNTER — Encounter: Payer: Self-pay | Admitting: Physical Medicine & Rehabilitation

## 2017-12-04 ENCOUNTER — Encounter: Payer: Medicare Other | Attending: Physical Medicine & Rehabilitation

## 2017-12-04 ENCOUNTER — Ambulatory Visit: Payer: Medicare Other | Admitting: Physical Medicine & Rehabilitation

## 2017-12-04 VITALS — BP 159/82 | HR 69 | Resp 14 | Ht 64.0 in | Wt 160.0 lb

## 2017-12-04 DIAGNOSIS — M5416 Radiculopathy, lumbar region: Secondary | ICD-10-CM | POA: Insufficient documentation

## 2017-12-04 NOTE — Progress Notes (Signed)
  PROCEDURE RECORD [facility]   Name: [name] DOB:1945-08-23 MRN: [id number]  Date:12/04/2017  Physician: Alysia Penna, MD    Nurse/CMA: Shirley Decamp, CMA   Allergies:  Allergies  Allergen Reactions  . Ampicillin Nausea And Vomiting  . Erythromycin Nausea And Vomiting    Can take Z pac  . Hctz [Hydrochlorothiazide]     dizzy  . Metoprolol     doublevision   . Morphine And Related Nausea And Vomiting  . Penicillin G Other (See Comments)    Consent Signed: Yes.    Is patient diabetic? No.  CBG today?   Pregnant: No. LMP: No LMP recorded. Patient is postmenopausal. (age 55-55)  Anticoagulants: no Anti-inflammatory: no Antibiotics: no  Procedure: right S1 transforaminal epidural steroid injection  Position: Prone Start Time: 12:03 pm   End Time: 12:10pm   Fluoro Time: 35  RN/CMA Alford Gamero, CMA Cheria Sadiq, CMA    Time 11:47am 12:16 pm    BP 159/82 173/82    Pulse 69 67    Respirations 14 14    O2 Sat 95 95    S/S 6 6    Pain Level 6/10 0/10     D/C home with husband (uber), patient A & O X 3, D/C instructions reviewed, and sits independently.

## 2017-12-04 NOTE — Patient Instructions (Signed)

## 2017-12-05 ENCOUNTER — Encounter: Payer: Self-pay | Admitting: Internal Medicine

## 2017-12-05 ENCOUNTER — Ambulatory Visit (INDEPENDENT_AMBULATORY_CARE_PROVIDER_SITE_OTHER): Payer: Medicare Other | Admitting: Internal Medicine

## 2017-12-05 DIAGNOSIS — M5431 Sciatica, right side: Secondary | ICD-10-CM

## 2017-12-05 DIAGNOSIS — Z01818 Encounter for other preprocedural examination: Secondary | ICD-10-CM | POA: Diagnosis not present

## 2017-12-05 DIAGNOSIS — E034 Atrophy of thyroid (acquired): Secondary | ICD-10-CM | POA: Diagnosis not present

## 2017-12-05 DIAGNOSIS — F411 Generalized anxiety disorder: Secondary | ICD-10-CM

## 2017-12-05 DIAGNOSIS — J3501 Chronic tonsillitis: Secondary | ICD-10-CM | POA: Diagnosis not present

## 2017-12-05 DIAGNOSIS — I10 Essential (primary) hypertension: Secondary | ICD-10-CM

## 2017-12-05 DIAGNOSIS — M5416 Radiculopathy, lumbar region: Secondary | ICD-10-CM | POA: Diagnosis not present

## 2017-12-05 MED ORDER — AZITHROMYCIN 250 MG PO TABS: ORAL_TABLET | ORAL | 0 refills | Status: DC

## 2017-12-05 NOTE — Assessment & Plan Note (Signed)
Tramadol prn

## 2017-12-05 NOTE — Assessment & Plan Note (Addendum)
Z-Pak was prescribed

## 2017-12-05 NOTE — Progress Notes (Addendum)
Subjective:  Patient ID: [name], female    DOB: 10-Jun-1946  Age: 72 y.o. MRN: [id number]  CC: No chief complaint on file.   HPI JOHNNA BOLLIER presents for IM consult Req by Dr Alvan Dame Reason: Abbie needs a THR R on July 9th 2019 Hx: chronic pain - on Tramadol per ortho.  F/u anxiety, hypothyroidism - stable. C/o brown  "tonsil plagues" x 6-8 wks every am  Past Medical History:  Diagnosis Date  . Allergy   . Anxiety   . Asthma   . Attention deficit disorder without mention of hyperactivity   . Colitis 12/05/2010  . Colon polyps   . Depression   . Diverticulosis   . GERD (gastroesophageal reflux disease)   . H/O hiatal hernia   . Hypertension   . IBS (irritable bowel syndrome)   . Osteoarthrosis, unspecified whether generalized or localized, unspecified site   . Other and unspecified hyperlipidemia   . Thyroid disease   . Ulcerative colitis   . Unspecified asthma(493.90)   . Unspecified essential hypertension    taken off meds since lost wt   Past Surgical History:  Procedure Laterality Date  . ABDOMINAL EXPOSURE N/A 04/22/2014   Procedure: ABDOMINAL EXPOSURE;  Surgeon: Angelia Mould, MD;  Location: [facility];  Service: Vascular;  Laterality: N/A;  . ANTERIOR LATERAL LUMBAR FUSION 4 LEVELS Left 04/22/2014   Procedure: Left Lumbar one/two, two/three, three/four, four,five. Anterior lateral lumbar interbody fusion**Stage 1**;  Surgeon: Erline Levine, MD;  Location: [facility];  Service: Neurosurgery;  Laterality: Left;  . ANTERIOR LUMBAR FUSION N/A 04/22/2014   Procedure: Lumbar five/-Sacral one. Anterior lumbar interbody fusion with Dr. Scot Dock; Left Lumbar one/two, two/three/three/four, four/five. Anterior lateral lumbar interbody fusion**Stage 1**;  Surgeon: Erline Levine, MD;  Location: [facility];  Service: Neurosurgery;  Laterality: N/A;  . BACK SURGERY     fluid removed from between disks  . BREAST SURGERY     bil breast reduction   .  CHOLECYSTECTOMY     Laparoscopic cholecystectomy  . COLONOSCOPY    . HIP FRACTURE SURGERY     left  11/01/2013  . LUMBAR PERCUTANEOUS PEDICLE SCREW 4 LEVEL N/A 04/25/2014   Procedure: **Stage 2** Percutaneous pedicle screw placement L1-5;  Surgeon: Erline Levine, MD;  Location: [facility];  Service: Neurosurgery;  Laterality: N/A;  **Stage 2** Percutaneous pedicle screw placement L1-5  . SHOULDER SURGERY     right shoulder 3x   . TOE SURGERY     right big toe  . TOTAL SHOULDER ARTHROPLASTY    . UPPER GASTROINTESTINAL ENDOSCOPY      reports that she quit smoking about 39 years ago. She has never used smokeless tobacco. She reports that she drinks about 0.6 oz of alcohol per week. She reports that she does not use drugs. family history includes Arthritis in her father; Colon cancer (age of onset: 56) in her father; Healthy in her brother and daughter; Heart disease in her father and mother; Hypertension in her father. Allergies  Allergen Reactions  . Ampicillin Nausea And Vomiting  . Erythromycin Nausea And Vomiting    Can take Z pac  . Hctz [Hydrochlorothiazide]     dizzy  . Metoprolol     doublevision   . Morphine And Related Nausea And Vomiting  . Penicillin G Other (See Comments)    Outpatient Medications Prior to Visit  Medication Sig Dispense Refill  . ALPRAZolam (XANAX) 0.5 MG tablet Take 1 tablet (  0.5 mg total) by mouth at bedtime as needed. 30 tablet 2  . amLODipine-valsartan (EXFORGE) 5-160 MG tablet Take 1 tablet by mouth daily. Follow-up appt is due must see provider for future refills 30 tablet 11  . buPROPion (WELLBUTRIN XL) 300 MG 24 hr tablet Take 1 tablet (300 mg total) by mouth daily. 90 tablet 1  . Cholecalciferol (VITAMIN D PO) Take 1 tablet by mouth daily.    . CONCERTA 36 MG CR tablet Take 36 mg by mouth daily.     . Cranberry 1000 MG CAPS As directed 30 each 11  . Cyanocobalamin (VITAMIN B-12 PO) Take 2 tablets by mouth daily.    . Diclofenac-Misoprostol  75-0.2 MG TBEC Take 1 tablet by mouth 2 (two) times daily as needed. 60 tablet 3  . FLUoxetine (PROZAC) 20 MG capsule Take 3 capsules (60 mg total) by mouth daily. 90 capsule 3  . inFLIXimab in sodium chloride 0.9 % Inject 5 mg/kg into the vein every 3 (three) months. 4 vials of 100mg     . lamoTRIgine (LAMICTAL) 100 MG tablet Take 1 tablet by mouth 3 (three) times daily.    [name] loratadine (CLARITIN) 10 MG tablet Take 10 mg by mouth daily.    . Melatonin 5 MG TABS Take 10 mg by mouth at bedtime.    . methocarbamol (ROBAXIN) 500 MG tablet Take 1 tablet (500 mg total) by mouth every 8 (eight) hours as needed for muscle spasms. 90 tablet 3  . pantoprazole (PROTONIX) 40 MG tablet TAKE ONE TABLET TWICE DAILY 60 tablet 11  . polymyxin B 500,000 Units in sodium chloride irrigation 0.9 % 500 mL Irrigate with as directed once.    . Probiotic Product (PROBIOTIC PO) Take 1 capsule by mouth daily.    [name] SYNTHROID 50 MCG tablet Take 2 tablets (100 mcg total) by mouth every morning. Patient needs office visit before refills will be given 180 tablet 0  . traMADol (ULTRAM) 50 MG tablet tramadol 50 mg tablet  Take 1-2 tablet(s) EVERY 4-6 HOURS by oral route    . VENTOLIN HFA 108 (90 Base) MCG/ACT inhaler ONE PUFF FOUR TIMES DAILY AS NEEDED 18 g 3   Facility-Administered Medications Prior to Visit  Medication Dose Route Frequency Provider Last Rate Last Dose  . 0.9 %  sodium chloride infusion  500 mL Intravenous Continuous Irene Shipper, MD        ROS: Review of Systems  Constitutional: Positive for fatigue. Negative for activity change, appetite change, chills and unexpected weight change.  HENT: Negative for congestion, mouth sores and sinus pressure.   Eyes: Negative for visual disturbance.  Respiratory: Negative for cough and chest tightness.   Gastrointestinal: Negative for abdominal pain and nausea.  Genitourinary: Negative for difficulty urinating, frequency and vaginal pain.  Musculoskeletal: Positive  for arthralgias, back pain, gait problem and myalgias.  Skin: Negative for pallor and rash.  Neurological: Negative for dizziness, tremors, weakness, numbness and headaches.  Psychiatric/Behavioral: Negative for confusion, sleep disturbance and suicidal ideas.    Objective:  BP 134/76 (BP Location: Left Arm, Patient Position: Sitting, Cuff Size: Normal)   Pulse 76   Temp 97.9 F (36.6 C) (Oral)   Ht 5\' 4"  (1.626 m)   Wt 158 lb (71.7 kg)   SpO2 100%   BMI 27.12 kg/m   BP Readings from Last 3 Encounters:  12/05/17 134/76  12/04/17 (!) 159/82  11/03/17 121/76    Wt Readings from Last 3 Encounters:  12/05/17 158  lb (71.7 kg)  12/04/17 160 lb (72.6 kg)  11/03/17 162 lb (73.5 kg)    Physical Exam  Constitutional: She appears well-developed. No distress.  HENT:  Head: Normocephalic.  Right Ear: External ear normal.  Left Ear: External ear normal.  Nose: Nose normal.  Mouth/Throat: Oropharynx is clear and moist.  Eyes: Pupils are equal, round, and reactive to light. Conjunctivae are normal. Right eye exhibits no discharge. Left eye exhibits no discharge.  Neck: Normal range of motion. Neck supple. No JVD present. No tracheal deviation present. No thyromegaly present.  Cardiovascular: Normal rate, regular rhythm and normal heart sounds.  Pulmonary/Chest: No stridor. No respiratory distress. She has no wheezes.  Abdominal: Soft. Bowel sounds are normal. She exhibits no distension and no mass. There is no tenderness. There is no rebound and no guarding.  Musculoskeletal: She exhibits no edema or tenderness.  Lymphadenopathy:    She has no cervical adenopathy.  Neurological: She displays normal reflexes. No cranial nerve deficit. She exhibits normal muscle tone. Coordination normal.  Skin: No rash noted. No erythema.  Psychiatric: She has a normal mood and affect. Her behavior is normal. Judgment and thought content normal.  LS tender hip tender cane  Lab Results  Component  Value Date   WBC 8.5 02/08/2016   HGB 12.1 02/08/2016   HCT 36.8 02/08/2016   PLT 338.0 02/08/2016   GLUCOSE 84 07/03/2016   CHOL 259 (H) 03/30/2014   TRIG 177.0 (H) 03/30/2014   HDL 47.40 03/30/2014   LDLDIRECT 182.5 02/02/2008   LDLCALC 176 (H) 03/30/2014   ALT 13 07/03/2016   AST 12 07/03/2016   NA 141 07/03/2016   K 4.0 07/03/2016   CL 108 07/03/2016   CREATININE 0.91 07/03/2016   BUN 18 07/03/2016   CO2 24 07/03/2016   TSH 2.42 07/03/2016   INR 1.0 11/25/2008   HGBA1C 5.5 04/24/2012    Dg Hip Unilat With Pelvis 2-3 Views Right  Result Date: 08/29/2017 CLINICAL DATA:  Increasing RIGHT hip pain, no injury EXAM: DG HIP (WITH OR WITHOUT PELVIS) 2-3V RIGHT COMPARISON:  Lumbar spine radiographs 01/26/2015 FINDINGS: Diffuse osseous demineralization. Prior lower lumbar fusion extending to S1. Lucency seen surrounding S1 screws bilaterally greater on LEFT. IM nail with compression screw at proximal LEFT femur post ORIF. LEFT hip joint space and BILATERAL SI joint spaces preserved. Significant joint space narrowing with mild sclerosis and mild superolateral subluxation of the RIGHT femoral head compatible with osteoarthritic changes. No acute fracture, dislocation or bone destruction. IMPRESSION: Osteoarthritic changes of RIGHT hip. Post proximal LEFT femoral ORIF. Prior lumbosacral fusion with lucencies seen surrounding the S1 screws bilaterally greater on LEFT question loosening versus infection. Electronically Signed   By: Lavonia Dana M.D.   On: 08/29/2017 16:24    Assessment & Plan:   There are no diagnoses linked to this encounter.   No orders of the defined types were placed in this encounter.    Follow-up: No follow-ups on file.  Walker Kehr, MD

## 2017-12-05 NOTE — Assessment & Plan Note (Signed)
Pt needs a THR this Dec 2019

## 2017-12-05 NOTE — Assessment & Plan Note (Signed)
Labs

## 2017-12-05 NOTE — Assessment & Plan Note (Signed)
[facility]  BP Readings from Last 3 Encounters:  12/05/17 134/76  12/04/17 (!) 159/82  11/03/17 121/76

## 2017-12-05 NOTE — Assessment & Plan Note (Signed)
Xanax prn 

## 2017-12-07 DIAGNOSIS — Z01818 Encounter for other preprocedural examination: Secondary | ICD-10-CM | POA: Insufficient documentation

## 2017-12-07 NOTE — Assessment & Plan Note (Signed)
[name] is medically clear for her hip surgery assuming her preop lab work/EKG/chest x-ray are acceptable.  Thank you!

## 2017-12-09 ENCOUNTER — Ambulatory Visit: Payer: Medicare Other | Admitting: Internal Medicine

## 2017-12-15 ENCOUNTER — Other Ambulatory Visit: Payer: Self-pay | Admitting: Orthopedic Surgery

## 2017-12-15 NOTE — H&P (Signed)
TOTAL HIP ADMISSION H&P  Patient is admitted for right total hip arthroplasty, anterior approach.  Subjective:  Chief Complaint:   Right hip primary OA / pain  HPI: [name], 72 y.o. female, has a history of pain and functional disability in the right hip(s) due to arthritis and patient has failed non-surgical conservative treatments for greater than 12 weeks to include NSAID's and/or analgesics, corticosteriod injections, use of assistive devices and activity modification.  Onset of symptoms was gradual starting years ago with gradually worsening course since that time.The patient noted prior procedures of the hip to include ORIF of the left hip on the right hip(s).  Patient currently rates pain in the right hip at 6 out of 10 with activity. Patient has night pain, worsening of pain with activity and weight bearing, trendelenberg gait, pain that interfers with activities of daily living and pain with passive range of motion. Patient has evidence of periarticular osteophytes and joint space narrowing by imaging studies. This condition presents safety issues increasing the risk of falls.  There is no current active infection.  Risks, benefits and expectations were discussed with the patient.  Risks including but not limited to the risk of anesthesia, blood clots, nerve damage, blood vessel damage, failure of the prosthesis, infection and up to and including death.  Patient understand the risks, benefits and expectations and wishes to proceed with surgery.   PCP: Plotnikov, Evie Lacks, MD  D/C Plans:       Home   Post-op Meds:       No Rx given  Tranexamic Acid:      To be given - IV   Decadron:      Is to be given  FYI:      ASA  Norco  Fentanyl  DME:   Pt already has equipment   PT:   No PT    Patient Active Problem List   Diagnosis Date Noted  . Preop exam for internal medicine 12/07/2017  . Chronic tonsillitis 12/05/2017  . Postlaminectomy syndrome, lumbar region 07/03/2017   . Chronic lumbar radiculopathy 07/03/2017  . Shingles outbreak 11/28/2016  . Acute conjunctivitis of right eye 11/28/2016  . Vision blurred 11/28/2016  . Bilateral hearing loss 11/28/2016  . Nasal sore 11/28/2016  . Acute pharyngitis 07/03/2016  . UTI (urinary tract infection) 11/30/2015  . Abdominal pain 09/14/2015  . Nausea without vomiting 07/31/2015  . Chronic right hip pain 07/02/2015  . Sciatica of right side 06/30/2015  . Concussion without loss of consciousness 05/23/2015  . Low back pain radiating to left lower extremity 12/22/2014  . Dizziness 08/11/2014  . DDD (degenerative disc disease), lumbosacral 04/21/2014  . Renal cyst 01/28/2014  . Microscopic hematuria 04/20/2013  . Ulcerative colitis ([place]) 05/06/2012  . Spinal disease 03/29/2012  . Arthralgia 11/01/2011  . Osteoarthritis 08/17/2010  . GERD 08/17/2010  . Dyslipidemia 02/19/2008  . ALLERGIC RHINITIS CAUSE UNSPECIFIED 05/26/2007  . Hypothyroidism 03/21/2007  . Anxiety state 03/21/2007  . Depression 03/21/2007  . Attention deficit disorder of adult 03/21/2007  . Essential hypertension 03/21/2007  . Asthma 03/21/2007  . Irritable bowel syndrome 03/21/2007   Past Medical History:  Diagnosis Date  . Allergy   . Anxiety   . Asthma   . Attention deficit disorder without mention of hyperactivity   . Colitis 12/05/2010  . Colon polyps   . Depression   . Diverticulosis   . GERD (gastroesophageal reflux disease)   . H/O hiatal hernia   . Hypertension   .  IBS (irritable bowel syndrome)   . Osteoarthrosis, unspecified whether generalized or localized, unspecified site   . Other and unspecified hyperlipidemia   . Thyroid disease   . Ulcerative colitis   . Unspecified asthma(493.90)   . Unspecified essential hypertension    taken off meds since lost wt    Past Surgical History:  Procedure Laterality Date  . ABDOMINAL EXPOSURE N/A 04/22/2014   Procedure: ABDOMINAL EXPOSURE;  Surgeon: Angelia Mould, MD;  Location: [facility];  Service: Vascular;  Laterality: N/A;  . ANTERIOR LATERAL LUMBAR FUSION 4 LEVELS Left 04/22/2014   Procedure: Left Lumbar one/two, two/three, three/four, four,five. Anterior lateral lumbar interbody fusion**Stage 1**;  Surgeon: Erline Levine, MD;  Location: [facility];  Service: Neurosurgery;  Laterality: Left;  . ANTERIOR LUMBAR FUSION N/A 04/22/2014   Procedure: Lumbar five/-Sacral one. Anterior lumbar interbody fusion with Dr. Scot Dock; Left Lumbar one/two, two/three/three/four, four/five. Anterior lateral lumbar interbody fusion**Stage 1**;  Surgeon: Erline Levine, MD;  Location: [facility];  Service: Neurosurgery;  Laterality: N/A;  . BACK SURGERY     fluid removed from between disks  . BREAST SURGERY     bil breast reduction   . CHOLECYSTECTOMY     Laparoscopic cholecystectomy  . COLONOSCOPY    . HIP FRACTURE SURGERY     left  11/01/2013  . LUMBAR PERCUTANEOUS PEDICLE SCREW 4 LEVEL N/A 04/25/2014   Procedure: **Stage 2** Percutaneous pedicle screw placement L1-5;  Surgeon: Erline Levine, MD;  Location: [facility];  Service: Neurosurgery;  Laterality: N/A;  **Stage 2** Percutaneous pedicle screw placement L1-5  . SHOULDER SURGERY     right shoulder 3x   . TOE SURGERY     right big toe  . TOTAL SHOULDER ARTHROPLASTY    . UPPER GASTROINTESTINAL ENDOSCOPY      Current Facility-Administered Medications  Medication Dose Route Frequency Provider Last Rate Last Dose  . 0.9 %  sodium chloride infusion  500 mL Intravenous Continuous Irene Shipper, MD       Current Outpatient Medications  Medication Sig Dispense Refill Last Dose  . ALPRAZolam (XANAX) 0.5 MG tablet Take 1 tablet (0.5 mg total) by mouth at bedtime as needed. 30 tablet 2 Taking  . amLODipine-valsartan (EXFORGE) 5-160 MG tablet Take 1 tablet by mouth daily. Follow-up appt is due must see provider for future refills 30 tablet 11 Taking  . azithromycin (ZITHROMAX Z-PAK) 250 MG tablet As  directed 6 tablet 0   . buPROPion (WELLBUTRIN XL) 300 MG 24 hr tablet Take 1 tablet (300 mg total) by mouth daily. 90 tablet 1 Taking  . Cholecalciferol (VITAMIN D PO) Take 1 tablet by mouth daily.   Taking  . CONCERTA 36 MG CR tablet Take 36 mg by mouth daily.    Taking  . Cranberry 1000 MG CAPS As directed 30 each 11 Taking  . Cyanocobalamin (VITAMIN B-12 PO) Take 2 tablets by mouth daily.   Taking  . Diclofenac-Misoprostol 75-0.2 MG TBEC Take 1 tablet by mouth 2 (two) times daily as needed. 60 tablet 3 Taking  . FLUoxetine (PROZAC) 20 MG capsule Take 3 capsules (60 mg total) by mouth daily. 90 capsule 3 Taking  . inFLIXimab in sodium chloride 0.9 % Inject 5 mg/kg into the vein every 3 (three) months. 4 vials of 100mg    Taking  . lamoTRIgine (LAMICTAL) 100 MG tablet Take 1 tablet by mouth 3 (three) times daily.   Taking  . loratadine (CLARITIN) 10 MG tablet Take  10 mg by mouth daily.   Taking  . Melatonin 5 MG TABS Take 10 mg by mouth at bedtime.   Taking  . methocarbamol (ROBAXIN) 500 MG tablet Take 1 tablet (500 mg total) by mouth every 8 (eight) hours as needed for muscle spasms. 90 tablet 3 Taking  . pantoprazole (PROTONIX) 40 MG tablet TAKE ONE TABLET TWICE DAILY 60 tablet 11 Taking  . polymyxin B 500,000 Units in sodium chloride irrigation 0.9 % 500 mL Irrigate with as directed once.   Taking  . Probiotic Product (PROBIOTIC PO) Take 1 capsule by mouth daily.   Taking  . SYNTHROID 50 MCG tablet Take 2 tablets (100 mcg total) by mouth every morning. Patient needs office visit before refills will be given 180 tablet 0 Taking  . traMADol (ULTRAM) 50 MG tablet tramadol 50 mg tablet  Take 1-2 tablet(s) EVERY 4-6 HOURS by oral route   Taking  . VENTOLIN HFA 108 (90 Base) MCG/ACT inhaler ONE PUFF FOUR TIMES DAILY AS NEEDED 18 g 3 Taking   Allergies  Allergen Reactions  . Ampicillin Nausea And Vomiting  . Erythromycin Nausea And Vomiting    Can take Z pac  . Hctz [Hydrochlorothiazide]      dizzy  . Metoprolol     doublevision   . Morphine And Related Nausea And Vomiting  . Penicillin G Other (See Comments)    Social History   Tobacco Use  . Smoking status: Former Smoker    Last attempt to quit: 10/30/1978    Years since quitting: 39.1  . Smokeless tobacco: Never Used  Substance Use Topics  . Alcohol use: Yes    Alcohol/week: 0.6 oz    Types: 1 Standard drinks or equivalent per week    Comment: once a week    Family History  Problem Relation Age of Onset  . Hypertension Father   . Colon cancer Father 59  . Arthritis Father   . Heart disease Father        CHF  . Heart disease Mother        Died, 70  . Healthy Brother   . Healthy Daughter   . Diabetes Neg Hx   . Breast cancer Neg Hx      Review of Systems  Constitutional: Positive for malaise/fatigue.  HENT: Negative.   Eyes: Negative.   Respiratory: Negative.   Cardiovascular: Negative.   Gastrointestinal: Positive for heartburn.  Genitourinary: Positive for frequency.  Musculoskeletal: Positive for back pain and joint pain.  Skin: Negative.   Neurological: Negative.   Endo/Heme/Allergies: Positive for environmental allergies.  Psychiatric/Behavioral: Positive for depression. The patient is nervous/anxious.     Objective:  Physical Exam  Constitutional: She is oriented to person, place, and time. She appears well-developed.  HENT:  Head: Normocephalic.  Eyes: Pupils are equal, round, and reactive to light.  Neck: Neck supple. No JVD present. No tracheal deviation present. No thyromegaly present.  Cardiovascular: Normal rate, regular rhythm and intact distal pulses.  Respiratory: Effort normal and breath sounds normal. No respiratory distress. She has no wheezes.  GI: Soft. There is no tenderness. There is no guarding.  Musculoskeletal:       Right hip: She exhibits decreased range of motion, decreased strength, tenderness and bony tenderness. She exhibits no swelling, no deformity and no  laceration.  Lymphadenopathy:    She has no cervical adenopathy.  Neurological: She is alert and oriented to person, place, and time.  Skin: Skin is warm and dry.  Psychiatric: She has a normal mood and affect.      Labs:  Estimated body mass index is 27.12 kg/m as calculated from the following:   Height as of 12/05/17: 5\' 4"  (1.626 m).   Weight as of 12/05/17: 71.7 kg (158 lb).   Imaging Review Plain radiographs demonstrate severe degenerative joint disease of the right hip. The bone quality appears to be good for age and reported activity level.    Preoperative templating of the joint replacement has been completed, documented, and submitted to the Operating Room personnel in order to optimize intra-operative equipment management.     Assessment/Plan:  End stage arthritis, right hip  The patient history, physical examination, clinical judgement of the provider and imaging studies are consistent with end stage degenerative joint disease of the right hip and total hip arthroplasty is deemed medically necessary. The treatment options including medical management, injection therapy, arthroscopy and arthroplasty were discussed at length. The risks and benefits of total hip arthroplasty were presented and reviewed. The risks due to aseptic loosening, infection, stiffness, dislocation/subluxation,  thromboembolic complications and other imponderables were discussed.  The patient acknowledged the explanation, agreed to proceed with the plan and consent was signed. Patient is being admitted for inpatient treatment for surgery, pain control, PT, OT, prophylactic antibiotics, VTE prophylaxis, progressive ambulation and ADL's and discharge planning.The patient is planning to be discharged home.     [place] Pugh Yassir Enis   PA-C  12/15/2017, 11:53 AM

## 2017-12-15 NOTE — Care Plan (Signed)
Ortho Bundle Scheduled R [facility] on 12-30-17 DCP:  Home with spouse.  1 story home with 2 ste. DME:  Rx for RW and 3-in-1 provided. PT:  HEP

## 2017-12-16 ENCOUNTER — Other Ambulatory Visit: Payer: Self-pay | Admitting: Internal Medicine

## 2017-12-16 NOTE — Telephone Encounter (Signed)
Not sure of med. pls advise if ok to refill.Marland KitchenJohny [name]

## 2017-12-19 ENCOUNTER — Encounter (HOSPITAL_COMMUNITY): Payer: Self-pay

## 2017-12-19 NOTE — Patient Instructions (Signed)
Your procedure is scheduled on: Tuesday, December 30, 2017   Surgery Time:  8:40Am-9:50AM   Report to [facility]  Entrance    Report to admitting at 6:10 AM   Call this number if you have problems the morning of surgery [phone]   Do not eat food or drink liquids :After Midnight.   Do NOT smoke after Midnight   Take these medicines the morning of surgery with A SIP OF WATER: Bupropion, Fluoxetine, Lamotrigine, Loratadine, Pantoprazole, Synthroid, Xanax if needed   Bring Asthma Inhaler day of surgery                                You may not have any metal on your body including hair pins, jewelry, and body piercings             Do not wear make-up, lotions, powders, perfumes/cologne, or deodorant             Do not wear nail polish.  Do not shave  48 hours prior to surgery.                Do not bring valuables to the hospital. [place].   Contacts, dentures or bridgework may not be worn into surgery.   Leave suitcase in the car. After surgery it may be brought to your room.   Special Instructions: Bring a copy of your healthcare power of attorney and living will documents         the day of surgery if you haven't scanned them in before.              Please read over the following fact sheets you were given:  [facility] - Preparing for Surgery Before surgery, you can play an important role.  Because skin is not sterile, your skin needs to be as free of germs as possible.  You can reduce the number of germs on your skin by washing with CHG (chlorahexidine gluconate) soap before surgery.  CHG is an antiseptic cleaner which kills germs and bonds with the skin to continue killing germs even after washing. Please DO NOT use if you have an allergy to CHG or antibacterial soaps.  If your skin becomes reddened/irritated stop using the CHG and inform your nurse when you arrive at Short Stay. Do not shave (including  legs and underarms) for at least 48 hours prior to the first CHG shower.  You may shave your face/neck.  Please follow these instructions carefully:  1.  Shower with CHG Soap the night before surgery and the  morning of surgery.  2.  If you choose to wash your hair, wash your hair first as usual with your normal  shampoo.  3.  After you shampoo, rinse your hair and body thoroughly to remove the shampoo.                             4.  Use CHG as you would any other liquid soap.  You can apply chg directly to the skin and wash.  Gently with a scrungie or clean washcloth.  5.  Apply the CHG Soap to your body ONLY FROM THE NECK DOWN.   Do   not use on face/ open  Wound or open sores. Avoid contact with eyes, ears mouth and   genitals (private parts).                       Wash face,  Genitals (private parts) with your normal soap.             6.  Wash thoroughly, paying special attention to the area where your    surgery  will be performed.  7.  Thoroughly rinse your body with warm water from the neck down.  8.  DO NOT shower/wash with your normal soap after using and rinsing off the CHG Soap.                9.  Pat yourself dry with a clean towel.            10.  Wear clean pajamas.            11.  Place clean sheets on your bed the night of your first shower and do not  sleep with pets. Day of Surgery : Do not apply any lotions/deodorants the morning of surgery.  Please wear clean clothes to the hospital/surgery center.  FAILURE TO FOLLOW THESE INSTRUCTIONS MAY RESULT IN THE CANCELLATION OF YOUR SURGERY  PATIENT SIGNATURE_________________________________  NURSE SIGNATURE__________________________________  ________________________________________________________________________   Adam Phenix  An incentive spirometer is a tool that can help keep your lungs clear and active. This tool measures how well you are filling your lungs with each breath. Taking  long deep breaths may help reverse or decrease the chance of developing breathing (pulmonary) problems (especially infection) following:  A long period of time when you are unable to move or be active. BEFORE THE PROCEDURE   If the spirometer includes an indicator to show your best effort, your nurse or respiratory therapist will set it to a desired goal.  If possible, sit up straight or lean slightly forward. Try not to slouch.  Hold the incentive spirometer in an upright position. INSTRUCTIONS FOR USE  1. Sit on the edge of your bed if possible, or sit up as far as you can in bed or on a chair. 2. Hold the incentive spirometer in an upright position. 3. Breathe out normally. 4. Place the mouthpiece in your mouth and seal your lips tightly around it. 5. Breathe in slowly and as deeply as possible, raising the piston or the ball toward the top of the column. 6. Hold your breath for 3-5 seconds or for as long as possible. Allow the piston or ball to fall to the bottom of the column. 7. Remove the mouthpiece from your mouth and breathe out normally. 8. Rest for a few seconds and repeat Steps 1 through 7 at least 10 times every 1-2 hours when you are awake. Take your time and take a few normal breaths between deep breaths. 9. The spirometer may include an indicator to show your best effort. Use the indicator as a goal to work toward during each repetition. 10. After each set of 10 deep breaths, practice coughing to be sure your lungs are clear. If you have an incision (the cut made at the time of surgery), support your incision when coughing by placing a pillow or rolled up towels firmly against it. Once you are able to get out of bed, walk around indoors and cough well. You may stop using the incentive spirometer when instructed by your caregiver.  RISKS AND COMPLICATIONS  Take your time  so you do not get dizzy or light-headed.  If you are in pain, you may need to take or ask for pain  medication before doing incentive spirometry. It is harder to take a deep breath if you are having pain. AFTER USE  Rest and breathe slowly and easily.  It can be helpful to keep track of a log of your progress. Your caregiver can provide you with a simple table to help with this. If you are using the spirometer at home, follow these instructions: [place] IF:   You are having difficultly using the spirometer.  You have trouble using the spirometer as often as instructed.  Your pain medication is not giving enough relief while using the spirometer.  You develop fever of 100.5 F (38.1 C) or higher. SEEK IMMEDIATE MEDICAL CARE IF:   You cough up bloody sputum that had not been present before.  You develop fever of 102 F (38.9 C) or greater.  You develop worsening pain at or near the incision site. MAKE SURE YOU:   Understand these instructions.  Will watch your condition.  Will get help right away if you are not doing well or get worse. Document Released: 10/21/2006 Document Revised: 09/02/2011 Document Reviewed: 12/22/2006 Exit[employer] Patient Information 2014 [employer], [place].   ________________________________________________________________________  WHAT IS A BLOOD TRANSFUSION? Blood Transfusion Information  A transfusion is the replacement of blood or some of its parts. Blood is made up of multiple cells which provide different functions.  Red blood cells carry oxygen and are used for blood loss replacement.  White blood cells fight against infection.  Platelets control bleeding.  Plasma helps clot blood.  Other blood products are available for specialized needs, such as hemophilia or other clotting disorders. BEFORE THE TRANSFUSION  Who gives blood for transfusions?   Healthy volunteers who are fully evaluated to make sure their blood is safe. This is blood bank blood. Transfusion therapy is the safest it has ever been in the practice of medicine.  Before blood is taken from a donor, a complete history is taken to make sure that person has no history of diseases nor engages in risky social behavior (examples are intravenous drug use or sexual activity with multiple partners). The donor's travel history is screened to minimize risk of transmitting infections, such as malaria. The donated blood is tested for signs of infectious diseases, such as HIV and hepatitis. The blood is then tested to be sure it is compatible with you in order to minimize the chance of a transfusion reaction. If you or a relative donates blood, this is often done in anticipation of surgery and is not appropriate for emergency situations. It takes many days to process the donated blood. RISKS AND COMPLICATIONS Although transfusion therapy is very safe and saves many lives, the main dangers of transfusion include:   Getting an infectious disease.  Developing a transfusion reaction. This is an allergic reaction to something in the blood you were given. Every precaution is taken to prevent this. The decision to have a blood transfusion has been considered carefully by your caregiver before blood is given. Blood is not given unless the benefits outweigh the risks. AFTER THE TRANSFUSION  Right after receiving a blood transfusion, you will usually feel much better and more energetic. This is especially true if your red blood cells have gotten low (anemic). The transfusion raises the level of the red blood cells which carry oxygen, and this usually causes an energy increase.  The  nurse administering the transfusion will monitor you carefully for complications. HOME CARE INSTRUCTIONS  No special instructions are needed after a transfusion. You may find your energy is better. Speak with your caregiver about any limitations on activity for underlying diseases you may have. SEEK MEDICAL CARE IF:   Your condition is not improving after your transfusion.  You develop redness or  irritation at the intravenous (IV) site. SEEK IMMEDIATE MEDICAL CARE IF:  Any of the following symptoms occur over the next 12 hours:  Shaking chills.  You have a temperature by mouth above 102 F (38.9 C), not controlled by medicine.  Chest, back, or muscle pain.  People around you feel you are not acting correctly or are confused.  Shortness of breath or difficulty breathing.  Dizziness and fainting.  You get a rash or develop hives.  You have a decrease in urine output.  Your urine turns a dark color or changes to pink, red, or brown. Any of the following symptoms occur over the next 10 days:  You have a temperature by mouth above 102 F (38.9 C), not controlled by medicine.  Shortness of breath.  Weakness after normal activity.  The white part of the eye turns yellow (jaundice).  You have a decrease in the amount of urine or are urinating less often.  Your urine turns a dark color or changes to pink, red, or brown. Document Released: 06/07/2000 Document Revised: 09/02/2011 Document Reviewed: 01/25/2008 [facility] Patient Information 2014 [place], [place].  _______________________________________________________________________

## 2017-12-22 ENCOUNTER — Inpatient Hospital Stay (HOSPITAL_COMMUNITY)
Admission: RE | Admit: 2017-12-22 | Discharge: 2017-12-22 | Disposition: A | Discharge status: Home / Self Care | Payer: Medicare Other | Source: Ambulatory Visit

## 2017-12-22 HISTORY — DX: Dorsalgia, unspecified: M54.9

## 2017-12-22 HISTORY — DX: Cyst of kidney, acquired: N28.1

## 2017-12-24 DIAGNOSIS — K5190 Ulcerative colitis, unspecified, without complications: Secondary | ICD-10-CM | POA: Diagnosis not present

## 2017-12-30 ENCOUNTER — Inpatient Hospital Stay (HOSPITAL_COMMUNITY): Admission: RE | Admit: 2017-12-30 | Payer: Medicare Other | Source: Ambulatory Visit | Admitting: Orthopedic Surgery

## 2017-12-30 ENCOUNTER — Encounter (HOSPITAL_COMMUNITY): Admission: RE | Payer: Self-pay | Source: Ambulatory Visit

## 2017-12-30 SURGERY — ARTHROPLASTY, HIP, TOTAL, ANTERIOR APPROACH
Anesthesia: Spinal | Site: Hip | Laterality: Right

## 2018-01-12 ENCOUNTER — Other Ambulatory Visit: Payer: Self-pay | Admitting: Internal Medicine

## 2018-01-16 ENCOUNTER — Encounter: Payer: Self-pay | Admitting: Physical Medicine & Rehabilitation

## 2018-01-16 ENCOUNTER — Encounter: Payer: Medicare Other | Attending: Physical Medicine & Rehabilitation

## 2018-01-16 ENCOUNTER — Ambulatory Visit (HOSPITAL_BASED_OUTPATIENT_CLINIC_OR_DEPARTMENT_OTHER): Payer: Medicare Other | Admitting: Physical Medicine & Rehabilitation

## 2018-01-16 VITALS — BP 149/95 | HR 70 | Ht 65.0 in | Wt 155.0 lb

## 2018-01-16 DIAGNOSIS — M961 Postlaminectomy syndrome, not elsewhere classified: Secondary | ICD-10-CM

## 2018-01-16 DIAGNOSIS — M533 Sacrococcygeal disorders, not elsewhere classified: Secondary | ICD-10-CM

## 2018-01-16 DIAGNOSIS — M25551 Pain in right hip: Secondary | ICD-10-CM

## 2018-01-16 DIAGNOSIS — M5416 Radiculopathy, lumbar region: Secondary | ICD-10-CM | POA: Diagnosis not present

## 2018-01-16 NOTE — Progress Notes (Signed)
Subjective:    Patient ID: [name], female    DOB: 08/21/1945, 72 y.o.   MRN: [id number]  HPI C/o LE weakness bilateral non progressive, does not exercise Has not had PT for some time Was scheduled for R Hip THA but cancelled Pain Inventory Average Pain 7 Pain Right Now 5 My pain is sharp and dull  In the last 24 hours, has pain interfered with the following? General activity 7 Relation with others 0 Enjoyment of life 5 What TIME of day is your pain at its worst? all Sleep (in general) Fair  Pain is worse with: walking, sitting and standing Pain improves with: heat/ice Relief from Meds: .  Mobility walk with assistance use a cane ability to climb steps?  no  Function retired  Neuro/Psych tingling trouble walking  Prior Studies Any changes since last visit?  no  Physicians involved in your care Any changes since last visit?  no   Family History  Problem Relation Age of Onset  . Hypertension Father   . Colon cancer Father 46  . Arthritis Father   . Heart disease Father        CHF  . Heart disease Mother        Died, 83  . Healthy Brother   . Healthy Daughter   . Diabetes Neg Hx   . Breast cancer Neg Hx    Social History   Socioeconomic History  . Marital status: Married    Spouse name: Not on file  . Number of children: 3  . Years of education: 60  . Highest education level: Not on file  Occupational History  . 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 Financial resource strain: Not hard at all  . Food insecurity:    Worry: Never true    Inability: Never true  . Transportation needs:    Medical: No    Non-medical: No  Tobacco Use  . Smoking status: Former Smoker    Last attempt to quit: 10/30/1978    Years since quitting: 39.2  . Smokeless tobacco: Never Used  Substance and Sexual Activity  . Alcohol use: Yes    Alcohol/week: 0.6 oz    Types: 1 Standard drinks or equivalent  per week    Comment: once a week  . Drug use: No  . Sexual activity: Yes  Lifestyle  . Physical activity:    Days per week: Not on file    Minutes per session: Not on file  . Stress: Not on file  Relationships  . Social connections:    Talks on phone: More than three times a week    Gets together: More than three times a week    Attends religious service: Not on file    Active member of club or organization: Not on file    Attends meetings of clubs or organizations: Not on file    Relationship status: Not on file  Other Topics Concern  . Not on file  Social History Narrative   [employer]; post-graduate study [employer].  married '68.  3 children: 2 daughters - '70, '72; 1 son '82; 6 grand-children. Lost her mother 2008, elderly father lives alone-in [place] died in Sep 12, 2022. owner/operator interior Agricultural consultant business, semi-retired. Lives with husband. Marriage is a work in progress      Caffeine - coffee 1 cup daily   Past Surgical History:  Procedure Laterality Date  .  ABDOMINAL EXPOSURE N/A 04/22/2014   Procedure: ABDOMINAL EXPOSURE;  Surgeon: Angelia Mould, MD;  Location: [place] [facility];  Service: Vascular;  Laterality: N/A;  . ANTERIOR LATERAL LUMBAR FUSION 4 LEVELS Left 04/22/2014   Procedure: Left Lumbar one/two, two/three, three/four, four,five. Anterior lateral lumbar interbody fusion**Stage 1**;  Surgeon: Erline Levine, MD;  Location: [place] [facility];  Service: Neurosurgery;  Laterality: Left;  . ANTERIOR LUMBAR FUSION N/A 04/22/2014   Procedure: Lumbar five/-Sacral one. Anterior lumbar interbody fusion with Dr. Scot Dock; Left Lumbar one/two, two/three/three/four, four/five. Anterior lateral lumbar interbody fusion**Stage 1**;  Surgeon: Erline Levine, MD;  Location: [place] [facility];  Service: Neurosurgery;  Laterality: N/A;  . BACK SURGERY     fluid removed from between disks  . BREAST SURGERY     bil breast reduction   . CHOLECYSTECTOMY     Laparoscopic cholecystectomy    . COLONOSCOPY    . HIP FRACTURE SURGERY     left  11/01/2013  . LUMBAR PERCUTANEOUS PEDICLE SCREW 4 LEVEL N/A 04/25/2014   Procedure: **Stage 2** Percutaneous pedicle screw placement L1-5;  Surgeon: Erline Levine, MD;  Location: [place] [facility];  Service: Neurosurgery;  Laterality: N/A;  **Stage 2** Percutaneous pedicle screw placement L1-5  . SHOULDER SURGERY     right shoulder 3x   . TOE SURGERY     right big toe  . TOTAL SHOULDER ARTHROPLASTY    . UPPER GASTROINTESTINAL ENDOSCOPY  09/28/2016   Dilation   Past Medical History:  Diagnosis Date  . Allergy   . Anxiety   . Asthma   . Attention deficit disorder without mention of hyperactivity   . Back pain   . Colitis 12/05/2010  . Colon polyps   . Depression   . Diverticulosis   . GERD (gastroesophageal reflux disease)   . H/O hiatal hernia   . Hypertension   . IBS (irritable bowel syndrome)   . Osteoarthrosis, unspecified whether generalized or localized, unspecified site   . Other and unspecified hyperlipidemia   . Renal cyst 10/2015   simple left  . Thyroid disease   . Ulcerative colitis   . Unspecified asthma(493.90)   . Unspecified essential hypertension    taken off meds since lost wt   BP (!) 149/95   Pulse 70   Ht 5' 5"  (1.651 m)   Wt 155 lb (70.3 kg)   SpO2 97%   BMI 25.79 kg/m   Opioid Risk Score:   Fall Risk Score:  `1  Depression screen PHQ 2/9  Depression screen [facility] 2/9 07/03/2017 07/03/2017 07/01/2017 06/26/2016 01/26/2015  Decreased Interest 1 0 0 0 1  Down, Depressed, Hopeless 1 0 1 0 0  PHQ - 2 Score 2 0 1 0 1  Altered sleeping 1 - 0 - -  Tired, decreased energy 1 - 2 - -  Change in appetite 0 - 0 - -  Feeling bad or failure about yourself  0 - 0 - -  Trouble concentrating 0 - 0 - -  Moving slowly or fidgety/restless 0 - 0 - -  Suicidal thoughts 0 - 0 - -  PHQ-9 Score 4 - 3 - -  Difficult doing work/chores Not difficult at all - Not difficult at all - -  Some recent data might be hidden    Review  of Systems  Constitutional: Negative.   HENT: Negative.   Eyes: Negative.   Respiratory: Negative.   Cardiovascular: Negative.   Gastrointestinal: Negative.   Endocrine: Negative.  Genitourinary: Negative.   Musculoskeletal: Positive for arthralgias, gait problem, joint swelling and myalgias.  Skin: Negative.   Allergic/Immunologic: Negative.   Neurological: Positive for numbness.  Hematological: Negative.   Psychiatric/Behavioral: Negative.   All other systems reviewed and are negative.      Objective:   Physical Exam  Constitutional: She is oriented to person, place, and time. She appears well-developed and well-nourished.  HENT:  Head: Normocephalic and atraumatic.  Eyes: Pupils are equal, round, and reactive to light. EOM are normal.  Musculoskeletal:       Right hip: She exhibits decreased range of motion and decreased strength.       Left hip: She exhibits decreased strength. She exhibits normal range of motion.       Lumbar back: She exhibits decreased range of motion, tenderness and deformity.  Pain over the Right PSIS   Neurological: She is alert and oriented to person, place, and time.  Nursing note and vitals reviewed.         Assessment & Plan:  1.  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  Axial low back pain , good relief with R SI IA injection and with L5 DR, S1.2,3 Lateral branch blocks willrepeat on Right side.  If 2nd set blocks results in a temporary relief , would Rec RF of same nerves

## 2018-01-23 ENCOUNTER — Ambulatory Visit: Payer: Medicare Other | Admitting: Physical Medicine & Rehabilitation

## 2018-01-26 ENCOUNTER — Encounter: Payer: Self-pay | Admitting: Internal Medicine

## 2018-01-27 ENCOUNTER — Encounter: Payer: Medicare Other | Attending: Physical Medicine & Rehabilitation

## 2018-01-27 ENCOUNTER — Encounter: Payer: Self-pay | Admitting: Physical Medicine & Rehabilitation

## 2018-01-27 ENCOUNTER — Ambulatory Visit (HOSPITAL_BASED_OUTPATIENT_CLINIC_OR_DEPARTMENT_OTHER): Payer: Medicare Other | Admitting: Physical Medicine & Rehabilitation

## 2018-01-27 VITALS — BP 133/77 | HR 70 | Resp 14 | Ht 65.0 in | Wt 159.0 lb

## 2018-01-27 DIAGNOSIS — M5416 Radiculopathy, lumbar region: Secondary | ICD-10-CM | POA: Insufficient documentation

## 2018-01-27 DIAGNOSIS — G8929 Other chronic pain: Secondary | ICD-10-CM | POA: Diagnosis not present

## 2018-01-27 DIAGNOSIS — M961 Postlaminectomy syndrome, not elsewhere classified: Secondary | ICD-10-CM | POA: Diagnosis not present

## 2018-01-27 DIAGNOSIS — M533 Sacrococcygeal disorders, not elsewhere classified: Secondary | ICD-10-CM

## 2018-01-27 NOTE — Progress Notes (Signed)
Right L5 dorsal ramus S1-S2-S3 lateral branch blocks under fluoroscopic guidance Left side  Informed consent was obtained after describing risks and benefits of the procedure with patient these include bleeding bruising and infection.  He elects to proceed and has given written consent.  Patient placed prone on fluoroscopy table Betadine prep sterile drape a 25-gauge 1.5 inch needle was used to anesthetize skin and subcu tissue with 1% lidocaine 1 cc into each of 4 sites.  Then a 22-gauge 3.5" needle was inserted under fluoroscopic guidance for starting the S1 SAP sacral ala junction.  Bone contact made.  Isovue 200 x 0.5 mL demonstrated no intravascular uptake then 0.5 mL of 2% lidocaine was injected.  Then the lateral aspect of the S1, S2, S3 foramen was targeted.  Bone contact made out, Isovue-200 times 0.5 mL demonstrated no nerve root or intravascular uptake within 0.5 mL of 2% lidocaine solution was injected after negative drawback for blood.  Patient tolerated procedure well.  Postinjection instructions given.

## 2018-01-27 NOTE — Patient Instructions (Signed)
Nerve blocks for sacroiliac pain will help with low back and buttock pain  R groin pain is due to hip arthritis  Right leg and foot pain due to pinched nerve in spine area

## 2018-01-27 NOTE — Progress Notes (Signed)
  PROCEDURE RECORD [name]   Name: [name] DOB:Nov 01, 1945 MRN: [id number]  Date:01/27/2018  Physician: Alysia Penna, MD    Nurse/CMA: Truman Hayward, CMA  Allergies:  Allergies  Allergen Reactions  . Ampicillin Nausea And Vomiting  . Erythromycin Nausea And Vomiting and Other (See Comments)    Can take Z pac  . Hctz [Hydrochlorothiazide] Other (See Comments)    dizzy  . Metoprolol Other (See Comments)    doublevision   . Molds & Smuts Other (See Comments)    Pt coughs and uses an inhaler.  . Morphine And Related Nausea And Vomiting  . Penicillin G Other (See Comments)    Unknown Has patient had a PCN reaction causing immediate rash, facial/tongue/throat swelling, SOB or lightheadedness with hypotension: Unknown Has patient had a PCN reaction causing severe rash involving mucus membranes or skin necrosis: Unknown Has patient had a PCN reaction that required hospitalization: Unknown Has patient had a PCN reaction occurring within the last 10 years: Unknown If all of the above answers are "NO", then may proceed with Cephalosporin use.     Consent Signed: Yes.    Is patient diabetic? No.  CBG today?   Pregnant: No. LMP: No LMP recorded. Patient is postmenopausal. (age 30-55)  Anticoagulants: no Anti-inflammatory: no Antibiotics: no Block Procedure: Right L5, S1-3 Lateral Branch Block  Position: Prone Start Time: 10:46am  End Time: 10:55  Fluoro Time: 26  RN/CMA Jamieson Lisa, CMA Lee, CMA    Time 10:20 am 11:01am    BP 133/77 158/83    Pulse 70 67    Respirations 14 14    O2 Sat 97 98    S/S 6 6    Pain Level 5/10 0/10     D/C home with husband, patient A & O X 3, D/C instructions reviewed, and sits independently.

## 2018-02-03 ENCOUNTER — Encounter: Payer: Self-pay | Admitting: Physical Therapy

## 2018-02-03 ENCOUNTER — Ambulatory Visit: Payer: Medicare Other | Attending: Physical Medicine & Rehabilitation | Admitting: Physical Therapy

## 2018-02-03 DIAGNOSIS — M25652 Stiffness of left hip, not elsewhere classified: Secondary | ICD-10-CM | POA: Insufficient documentation

## 2018-02-03 DIAGNOSIS — M25651 Stiffness of right hip, not elsewhere classified: Secondary | ICD-10-CM | POA: Diagnosis not present

## 2018-02-03 DIAGNOSIS — R293 Abnormal posture: Secondary | ICD-10-CM | POA: Diagnosis not present

## 2018-02-03 DIAGNOSIS — R2689 Other abnormalities of gait and mobility: Secondary | ICD-10-CM | POA: Diagnosis not present

## 2018-02-03 DIAGNOSIS — M25661 Stiffness of right knee, not elsewhere classified: Secondary | ICD-10-CM | POA: Diagnosis not present

## 2018-02-03 DIAGNOSIS — M6281 Muscle weakness (generalized): Secondary | ICD-10-CM | POA: Diagnosis not present

## 2018-02-03 NOTE — Therapy (Signed)
[place], [place], [address] Phone: [phone]   Fax:  [phone]  Physical Therapy Evaluation  Patient Details  Name: [name] MRN: [id number] Date of Birth: [birth year] Referring Provider: Dr Alysia Penna   Encounter Date: 02/03/2018  PT End of Session - 02/03/18 1330    Visit Number  1    Number of Visits  12    Date for PT Re-Evaluation  03/17/18    Authorization Type  MCR    PT Start Time  1330    PT Stop Time  1441    PT Time Calculation (min)  71 min    Activity Tolerance  Patient tolerated treatment well       Past Medical History:  Diagnosis Date  . Allergy   . Anxiety   . Asthma   . Attention deficit disorder without mention of hyperactivity   . Back pain   . Colitis 12/05/2010  . Colon polyps   . Depression   . Diverticulosis   . GERD (gastroesophageal reflux disease)   . H/O hiatal hernia   . Hypertension   . IBS (irritable bowel syndrome)   . Osteoarthrosis, unspecified whether generalized or localized, unspecified site   . Other and unspecified hyperlipidemia   . Renal cyst 10/2015   simple left  . Thyroid disease   . Ulcerative colitis   . Unspecified asthma(493.90)   . Unspecified essential hypertension    taken off meds since lost wt    Past Surgical History:  Procedure Laterality Date  . ABDOMINAL EXPOSURE N/A 04/22/2014   Procedure: ABDOMINAL EXPOSURE;  Surgeon: Angelia Mould, MD;  Location: [place] [facility];  Service: Vascular;  Laterality: N/A;  . ANTERIOR LATERAL LUMBAR FUSION 4 LEVELS Left 04/22/2014   Procedure: Left Lumbar one/two, two/three, three/four, four,five. Anterior lateral lumbar interbody fusion**Stage 1**;  Surgeon: Erline Levine, MD;  Location: [place] [facility];  Service: Neurosurgery;  Laterality: Left;  . ANTERIOR LUMBAR FUSION N/A 04/22/2014   Procedure: Lumbar five/-Sacral one. Anterior lumbar interbody fusion with Dr. Scot Dock; Left  Lumbar one/two, two/three/three/four, four/five. Anterior lateral lumbar interbody fusion**Stage 1**;  Surgeon: Erline Levine, MD;  Location: [place] [facility];  Service: Neurosurgery;  Laterality: N/A;  . BACK SURGERY     fluid removed from between disks  . BREAST SURGERY     bil breast reduction   . CHOLECYSTECTOMY     Laparoscopic cholecystectomy  . COLONOSCOPY    . HIP FRACTURE SURGERY     left  11/01/2013  . LUMBAR PERCUTANEOUS PEDICLE SCREW 4 LEVEL N/A 04/25/2014   Procedure: **Stage 2** Percutaneous pedicle screw placement L1-5;  Surgeon: Erline Levine, MD;  Location: [place] [facility];  Service: Neurosurgery;  Laterality: N/A;  **Stage 2** Percutaneous pedicle screw placement L1-5  . SHOULDER SURGERY     right shoulder 3x   . TOE SURGERY     right big toe  . TOTAL SHOULDER ARTHROPLASTY    . UPPER GASTROINTESTINAL ENDOSCOPY  09/28/2016   Dilation    There were no vitals filed for this visit.   Subjective Assessment - 02/03/18 1330    Subjective  Pt reports she needs her legs to get stronger, she has had multiple surgeries on her back, most recent one 2015 for fusion in her spine.  She reports that she has not healed the way it needed to.  She has a lot of trouble with standing and walking. Lt hip bursitis currently  Pertinent History  Lt THA 2015, needs Rt THA - pt holding off on this, Rt knee pain - bone on bone    How long can you sit comfortably?  tolerated 5' combination of hip bursitis and back    How long can you stand comfortably?  5'    How long can you walk comfortably?  ambulates with [place]    Diagnostic tests  MRI, xrays and lots of things    Patient Stated Goals  get legs stronger, walk without cane if it is possible using regular pattern    Currently in Pain?  Yes    Pain Score  5     Pain Location  Hip    Pain Orientation  Right   on her sit bone area   Pain Descriptors / Indicators  Aching    Pain Type  Chronic pain    Pain Radiating Towards  into the Rt thigh    Pain  Onset  More than a month ago    Pain Frequency  Constant    Aggravating Factors   sleeping    Pain Relieving Factors  move around gently         [facility] PT Assessment - 02/03/18 0001      Assessment   Medical Diagnosis  chronic LBP, Rt hip pain    Referring Provider  Dr Alysia Penna    Onset Date/Surgical Date  02/03/14    Hand Dominance  Right    Next MD Visit  03/17/18    Prior Therapy  yes      Precautions   Precautions  None   other than limited activities     Balance Screen   Has the patient fallen in the past 6 months  Yes    How many times?  --   multiple - happens most when she doesnt pay attention   Has the patient had a decrease in activity level because of a fear of falling?   Yes    Is the patient reluctant to leave their home because of a fear of falling?   No      Home Film/video editor residence    Living Arrangements  Spouse/significant other    Home Layout  One level      Prior Function   Level of [place]  Retired    Leisure  walk when able, garden, Doctor, general practice      Observation/Other Assessments   Focus on Therapeutic Outcomes (FOTO)   57% limited      Sensation   Light Touch  Appears Intact    Hot/Cold  Appears Intact      Functional Tests   Functional tests  Single leg stance      Squat   Comments  NA      Single Leg Stance   Comments  Rt 2 sec, Lt 3 sec      Posture/Postural Control   Posture/Postural Control  Postural limitations    Postural Limitations  Weight shift left;Rounded Shoulders;Forward head;Flexed trunk;Decreased lumbar lordosis;Decreased thoracic kyphosis   Rt knee flex and externally rotated     ROM / Strength   AROM / PROM / Strength  AROM;Strength      AROM   AROM Assessment Site  Hip;Lumbar;Knee    Right/Left Hip  Left;Right    Right Hip Extension  -5   from neutral   Left Hip Extension  --  to neutral   Right/Left Knee  Left;Right     Right Knee Extension  -16    Right Knee Flexion  130    Left Knee Extension  125    Left Knee Flexion  0    Lumbar Flexion  to floor - pain with return to stand    Lumbar Extension  25% present    Lumbar - Right Rotation  25% present    Lumbar - Left Rotation  50% present      Strength   Strength Assessment Site  Hip;Knee;Ankle    Right/Left Hip  Left;Right    Right Hip Flexion  4/5    Right Hip Extension  4-/5    Right Hip ABduction  3-/5    Left Hip Flexion  4-/5    Left Hip Extension  3-/5    Left Hip ABduction  3-/5    Right/Left Knee  --   grossly 4+/5 with palpable creptitus   Right/Left Ankle  --   WNL     Flexibility   Soft Tissue Assessment /Muscle Length  yes    Hamstrings  supine SLR bilat to 90 degrees      Palpation   Spinal mobility  NA due to fusion    Palpation comment  very tight in Rt gluts/piriffromis, some tightness in Lt side. bilat QLs tight , tender at Rt ischial tubersosity and posterior greater trochanter      Gait abnormalities - trendelendurg, rt LE externally rotated, knee flexed, forward lead at hips, decreased spinal curves and trunk rotation.           Objective measurements completed on examination: See above findings.      [facility] Adult PT Treatment/Exercise - 02/03/18 0001      Exercises   Exercises  Knee/Hip      Knee/Hip Exercises: Seated   Long Arc Quad  AAROM;Both;10 reps   5 sec holds     Knee/Hip Exercises: Sidelying   Clams  10 reps bilat      Knee/Hip Exercises: Prone   Hip Extension  Strengthening;Both;10 reps   VC for form   Other Prone Exercises  10x5sec quad press      Modalities   Modalities  Electrical Stimulation;Moist Heat      Moist Heat Therapy   Number Minutes Moist Heat  15 Minutes    Moist Heat Location  Lumbar Spine;Hip      Electrical Stimulation   Electrical Stimulation Location  Rt hip    Electrical Stimulation Action  IFC    Electrical Stimulation Parameters  to tolerance    Electrical  Stimulation Goals  Pain             PT Education - 02/03/18 1432    Education Details  HEP, home TENs, DN handout only    Person(s) Educated  Patient    Methods  Explanation;Demonstration;Handout    Comprehension  Returned demonstration;Verbalized understanding;Need further instruction       PT Short Term Goals - 02/03/18 1452      PT SHORT TERM GOAL #1   Title  I with initial HEP     Time  3    Period  Weeks    Status  New    Target Date  02/24/18      PT SHORT TERM GOAL #2   Title  report decrease pain in Rt hip =/> 25% with use of home TENs unit  to allow her to walk more  Time  3    Period  Weeks    Status  New      PT SHORT TERM GOAL #3   Title  increase Rt knee extension to =/better than -5 degrees to decrease amount of trendelenburg with gait    Time  3    Period  Weeks    Status  New    Target Date  02/24/18        PT Long Term Goals - 02/03/18 1453      PT LONG TERM GOAL #1   Title  I with advanced HEP to include a pool program    Time  6    Period  Weeks    Status  New    Target Date  03/17/18      PT LONG TERM GOAL #2   Title  increase bilat hip/knee strength =/> 4-4+/5 to assist with mobility    Time  6    Period  Weeks    Status  New    Target Date  03/17/18      PT LONG TERM GOAL #3   Title  improve FOTO =/< 52% limited    Time  6    Period  Weeks    Status  New    Target Date  03/17/18      PT LONG TERM GOAL #4   Title  improve bilat hip extension =/> 10 degrees to assist in normalizing gait and allow her to stand upright    Time  6    Period  Weeks    Status  New    Target Date  03/17/18      PT LONG TERM GOAL #5   Title  report =/> 50% improvement in abilty to perform her gardening     Time  6    Period  Weeks    Status  New    Target Date  03/17/18             Plan - 02/03/18 1433    Clinical Impression Statement  72 yo female with multiple orthopedic issues, chronic LBP with SIJ dysfunction are the reason  for this referral.  Patients main concern is wanting to strengthen her legs and be able to walk better and if possible without her cane.  She is needing a Rt THA and TKA and has been discussing with MD as to when to have these.  She had backinjections last week with minimal relief.  She understands that she will not be painfree, just wishes to be aboe to do more.  She has a Rt knee flexion contracture, very tight and limited bilat hip extension and weakness through both of her hips, knees and core.  She is shifts her weight to the left in standing, has flexed posture and a decrease in all her spinal curves.  There is a lot of tightness in the muscles in her buttocks and she had multiple hamstring spasms during the evaluation.     History and Personal Factors relevant to plan of care:  Rt hip ORIF, multiple back surgeries - last one in 2015 for fusion, shoulder surgeries, needs hip and knee replacements    Clinical Presentation  Evolving    Clinical Presentation due to:  multiple medical /orthopedic issues.     Clinical Decision Making  Moderate    Rehab Potential  Good    PT Frequency  2x / week    PT Duration  6 weeks  PT Treatment/Interventions  Iontophoresis 4mg /ml Dexamethasone;Gait training;Stair training;Neuromuscular re-education;Dry needling;Manual techniques;Moist Heat;Ultrasound;Patient/family education;Taping;Therapeutic exercise;Cryotherapy;Electrical Stimulation    PT Next Visit Plan  possible DN, manual work to Rt gluts/hip rotators, stretching into hip extension and Rt knee extension, teach hip flexion stetches    Consulted and Agree with Plan of Care  Patient       Patient will benefit from skilled therapeutic intervention in order to improve the following deficits and impairments:  Abnormal gait, Pain, Improper body mechanics, Increased muscle spasms, Decreased range of motion, Decreased strength, Difficulty walking  Visit Diagnosis: Muscle weakness (generalized) - Plan: PT plan  of care cert/re-cert  Stiffness of right hip, not elsewhere classified - Plan: PT plan of care cert/re-cert  Stiffness of left hip, not elsewhere classified - Plan: PT plan of care cert/re-cert  Stiffness of right knee, not elsewhere classified - Plan: PT plan of care cert/re-cert  Abnormal posture - Plan: PT plan of care cert/re-cert  Other abnormalities of gait and mobility - Plan: PT plan of care cert/re-cert     Problem List Patient Active Problem List   Diagnosis Date Noted  . Preop exam for internal medicine 12/07/2017  . Chronic tonsillitis 12/05/2017  . Postlaminectomy syndrome, lumbar region 07/03/2017  . Chronic lumbar radiculopathy 07/03/2017  . Shingles outbreak 11/28/2016  . Acute conjunctivitis of right eye 11/28/2016  . Vision blurred 11/28/2016  . Bilateral hearing loss 11/28/2016  . Nasal sore 11/28/2016  . Acute pharyngitis 07/03/2016  . UTI (urinary tract infection) 11/30/2015  . Abdominal pain 09/14/2015  . Nausea without vomiting 07/31/2015  . Chronic right hip pain 07/02/2015  . Sciatica of right side 06/30/2015  . Concussion without loss of consciousness 05/23/2015  . Low back pain radiating to left lower extremity 12/22/2014  . Dizziness 08/11/2014  . DDD (degenerative disc disease), lumbosacral 04/21/2014  . Renal cyst 01/28/2014  . Microscopic hematuria 04/20/2013  . Ulcerative colitis ([place]) 05/06/2012  . Spinal disease 03/29/2012  . Arthralgia 11/01/2011  . Osteoarthritis 08/17/2010  . GERD 08/17/2010  . Dyslipidemia 02/19/2008  . ALLERGIC RHINITIS CAUSE UNSPECIFIED 05/26/2007  . Hypothyroidism 03/21/2007  . Anxiety state 03/21/2007  . Depression 03/21/2007  . Attention deficit disorder of adult 03/21/2007  . Essential hypertension 03/21/2007  . Asthma 03/21/2007  . Irritable bowel syndrome 03/21/2007    Boneta Lucks rPT  02/03/2018, 3:04 PM  [facility] [address] [place], [place], [address] Phone: [phone]   Fax:  [phone]  Name: [name] MRN: [id number] Date of Birth: 10-26-1945

## 2018-02-03 NOTE — Patient Instructions (Addendum)
TENS UNIT: This is helpful for muscle pain and spasm.   Search and Purchase a TENS 7000 2nd edition at www.tenspros.com. It should be less than $30.     TENS unit instructions: Do not shower or bathe with the unit on Turn the unit off before removing electrodes or batteries If the electrodes lose stickiness add a drop of water to the electrodes after they are disconnected from the unit and place on plastic sheet. If you continued to have difficulty, call the TENS unit company to purchase more electrodes. Do not apply lotion on the skin area prior to use. Make sure the skin is clean and dry as this will help prolong the life of the electrodes. After use, always check skin for unusual red areas, rash or other skin difficulties. If there are any skin problems, does not apply electrodes to the same area. Never remove the electrodes from the unit by pulling the wires. Do not use the TENS unit or electrodes other than as directed. Do not change electrode placement without consultating your therapist or physician. Keep 2 fingers with between each electrode. Wear time ratio is 2:1, on to off times.    For example on for 30 minutes off for 15 minutes and then on for 30 minutes off for 15 minutes  Trigger Point Dry Needling  . What is Trigger Point Dry Needling (DN)? o DN is a physical therapy technique used to treat muscle pain and dysfunction. Specifically, DN helps deactivate muscle trigger points (muscle knots).  o A thin filiform needle is used to penetrate the skin and stimulate the underlying trigger point. The goal is for a local twitch response (LTR) to occur and for the trigger point to relax. No medication of any kind is injected during the procedure.   . What Does Trigger Point Dry Needling Feel Like?  o The procedure feels different for each individual patient. Some patients report that they do not actually feel the needle enter the skin and overall the process is not painful. Very  mild bleeding may occur. However, many patients feel a deep cramping in the muscle in which the needle was inserted. This is the local twitch response.   [name] How Will I feel after the treatment? o Soreness is normal, and the onset of soreness may not occur for a few hours. Typically this soreness does not last longer than two days.  o Bruising is uncommon, however; ice can be used to decrease any possible bruising.  o In rare cases feeling tired or nauseous after the treatment is normal. In addition, your symptoms may get worse before they get better, this period will typically not last longer than 24 hours.   . What Can I do After My Treatment? o Increase your hydration by drinking more water for the next 24 hours. o You may place ice or heat on the areas treated that have become sore, however, do not use heat on inflamed or bruised areas. Heat often brings more relief post needling. o You can continue your regular activities, but vigorous activity is not recommended initially after the treatment for 24 hours. o DN is best combined with other physical therapy such as strengthening, stretching, and other therapies.

## 2018-02-09 ENCOUNTER — Ambulatory Visit: Payer: Medicare Other | Admitting: Physical Medicine & Rehabilitation

## 2018-02-09 ENCOUNTER — Encounter

## 2018-02-10 ENCOUNTER — Ambulatory Visit (INDEPENDENT_AMBULATORY_CARE_PROVIDER_SITE_OTHER): Payer: Medicare Other | Admitting: Internal Medicine

## 2018-02-10 ENCOUNTER — Encounter: Payer: Self-pay | Admitting: Internal Medicine

## 2018-02-10 ENCOUNTER — Other Ambulatory Visit (INDEPENDENT_AMBULATORY_CARE_PROVIDER_SITE_OTHER): Payer: Medicare Other

## 2018-02-10 VITALS — BP 128/74 | HR 75 | Temp 98.3°F | Ht 65.0 in | Wt 156.0 lb

## 2018-02-10 DIAGNOSIS — M961 Postlaminectomy syndrome, not elsewhere classified: Secondary | ICD-10-CM

## 2018-02-10 DIAGNOSIS — M79605 Pain in left leg: Secondary | ICD-10-CM | POA: Diagnosis not present

## 2018-02-10 DIAGNOSIS — I10 Essential (primary) hypertension: Secondary | ICD-10-CM | POA: Diagnosis not present

## 2018-02-10 DIAGNOSIS — K580 Irritable bowel syndrome with diarrhea: Secondary | ICD-10-CM

## 2018-02-10 DIAGNOSIS — E034 Atrophy of thyroid (acquired): Secondary | ICD-10-CM | POA: Diagnosis not present

## 2018-02-10 DIAGNOSIS — R202 Paresthesia of skin: Secondary | ICD-10-CM

## 2018-02-10 DIAGNOSIS — K5100 Ulcerative (chronic) pancolitis without complications: Secondary | ICD-10-CM

## 2018-02-10 DIAGNOSIS — M5416 Radiculopathy, lumbar region: Secondary | ICD-10-CM | POA: Diagnosis not present

## 2018-02-10 DIAGNOSIS — M545 Low back pain: Secondary | ICD-10-CM

## 2018-02-10 DIAGNOSIS — Z01818 Encounter for other preprocedural examination: Secondary | ICD-10-CM | POA: Diagnosis not present

## 2018-02-10 LAB — URINALYSIS, ROUTINE W REFLEX MICROSCOPIC
BILIRUBIN URINE: NEGATIVE
Hgb urine dipstick: NEGATIVE
KETONES UR: NEGATIVE
NITRITE: NEGATIVE
PH: 6 (ref 5.0–8.0)
RBC / HPF: NONE SEEN (ref 0–?)
Specific Gravity, Urine: 1.02 (ref 1.000–1.030)
Total Protein, Urine: NEGATIVE
URINE GLUCOSE: NEGATIVE
UROBILINOGEN UA: 0.2 (ref 0.0–1.0)

## 2018-02-10 LAB — BASIC METABOLIC PANEL
BUN: 14 mg/dL (ref 6–23)
CO2: 26 meq/L (ref 19–32)
Calcium: 9.8 mg/dL (ref 8.4–10.5)
Chloride: 103 mEq/L (ref 96–112)
Creatinine, Ser: 0.98 mg/dL (ref 0.40–1.20)
GFR: 59.26 mL/min — AB (ref >60.00)
Glucose, Bld: 97 mg/dL (ref 70–99)
POTASSIUM: 4.1 meq/L (ref 3.5–5.1)
Sodium: 138 mEq/L (ref 135–145)

## 2018-02-10 LAB — CBC WITH DIFFERENTIAL/PLATELET
Basophils Absolute: 0.1 10*3/uL (ref 0.0–0.1)
Basophils Relative: 1 % (ref 0.0–3.0)
EOS ABS: 0.3 10*3/uL (ref 0.0–0.7)
Eosinophils Relative: 4.5 % (ref 0.0–5.0)
HCT: 40.8 % (ref 36.0–46.0)
HEMOGLOBIN: 13.7 g/dL (ref 12.0–15.0)
LYMPHS ABS: 1.4 10*3/uL (ref 0.7–4.0)
Lymphocytes Relative: 20.9 % (ref 12.0–46.0)
MCHC: 33.5 g/dL (ref 30.0–36.0)
MCV: 91.9 fl (ref 78.0–100.0)
MONO ABS: 0.7 10*3/uL (ref 0.1–1.0)
Monocytes Relative: 10.2 % (ref 3.0–12.0)
NEUTROS PCT: 63.4 % (ref 43.0–77.0)
Neutro Abs: 4.3 10*3/uL (ref 1.4–7.7)
Platelets: 230 10*3/uL (ref 150.0–400.0)
RBC: 4.44 Mil/uL (ref 3.87–5.11)
RDW: 15 % (ref 11.5–15.5)
WBC: 6.7 10*3/uL (ref 4.0–10.5)

## 2018-02-10 LAB — TSH: TSH: 1.47 u[IU]/mL (ref 0.35–4.50)

## 2018-02-10 LAB — T3, FREE: T3 FREE: 4.3 pg/mL — AB (ref 2.3–4.2)

## 2018-02-10 LAB — HEPATIC FUNCTION PANEL
ALBUMIN: 4.4 g/dL (ref 3.5–5.2)
ALT: 17 U/L (ref 0–35)
AST: 16 U/L (ref 0–37)
Alkaline Phosphatase: 84 U/L (ref 39–117)
BILIRUBIN TOTAL: 0.6 mg/dL (ref 0.2–1.2)
Bilirubin, Direct: 0.1 mg/dL (ref 0.0–0.3)
Total Protein: 7.2 g/dL (ref 6.0–8.3)

## 2018-02-10 LAB — T4, FREE: FREE T4: 1.17 ng/dL (ref 0.60–1.60)

## 2018-02-10 LAB — MAGNESIUM: MAGNESIUM: 2.1 mg/dL (ref 1.5–2.5)

## 2018-02-10 LAB — VITAMIN B12

## 2018-02-10 MED ORDER — TRAMADOL HCL 50 MG PO TABS: 50.0000 mg | ORAL_TABLET | Freq: Four times a day (QID) | ORAL | 1 refills | Status: DC | PRN

## 2018-02-10 MED ORDER — ALPRAZOLAM 0.5 MG PO TABS: 0.5000 mg | ORAL_TABLET | Freq: Two times a day (BID) | ORAL | 1 refills | Status: DC | PRN

## 2018-02-10 NOTE — Assessment & Plan Note (Signed)
Cane Tramadol prn - planning to stop

## 2018-02-10 NOTE — Assessment & Plan Note (Addendum)
Chronic  8/19 Tramadol - not that helpful She will try CBD oil

## 2018-02-10 NOTE — Assessment & Plan Note (Signed)
Exforge 

## 2018-02-10 NOTE — Progress Notes (Signed)
Subjective:  Patient ID: [name], female    DOB: [birth year]  Age: 72 y.o. MRN: [id number]  CC: No chief complaint on file.   HPI [name] presents for fatigue, LBP, HTN, anxiety f/u  Outpatient Medications Prior to Visit  Medication Sig Dispense Refill  . ALPRAZolam (XANAX) 0.5 MG tablet Take 1 tablet (0.5 mg total) by mouth at bedtime as needed. (Patient taking differently: Take 0.5 mg by mouth at bedtime as needed for anxiety. ) 30 tablet 2  . amLODipine-valsartan (EXFORGE) 5-160 MG tablet Take 1 tablet by mouth daily. Follow-up appt is due must see provider for future refills 30 tablet 11  . buPROPion (WELLBUTRIN XL) 300 MG 24 hr tablet Take 1 tablet (300 mg total) by mouth daily. 90 tablet 1  . Cholecalciferol (VITAMIN D PO) Take 1 tablet by mouth daily.    . CONCERTA 36 MG CR tablet Take 36 mg by mouth daily.     . Cranberry 1000 MG CAPS As directed 30 each 11  . Cyanocobalamin (VITAMIN B-12 PO) Take 2 tablets by mouth daily.    . Diclofenac-miSOPROStol 75-0.2 MG TBEC TAKE ONE TABLET TWICE DAILY AS NEEDED 60 tablet 3  . FLUoxetine (PROZAC) 20 MG capsule Take 3 capsules (60 mg total) by mouth daily. 90 capsule 3  . inFLIXimab in sodium chloride 0.9 % Inject 5 mg/kg into the vein every 3 (three) months. 4 vials of 183m    . lamoTRIgine (LAMICTAL) 100 MG tablet Take 1 tablet by mouth 3 (three) times daily.    .Marland Kitchenloratadine (CLARITIN) 10 MG tablet Take 10 mg by mouth daily.    . Melatonin 5 MG TABS Take 10 mg by mouth at bedtime.    . methocarbamol (ROBAXIN) 500 MG tablet TAKE ONE TABLET EVERY EIGHT HOURS AS NEEDED FOR MUSCLE SPASMS 90 tablet 3  . pantoprazole (PROTONIX) 40 MG tablet TAKE ONE TABLET TWICE DAILY 60 tablet 11  . polymyxin B 500,000 Units in sodium chloride irrigation 0.9 % 500 mL Irrigate with as directed once.    . Probiotic Product (PROBIOTIC PO) Take 1 capsule by mouth daily.    .Marland KitchenSYNTHROID 50 MCG tablet TAKE 2 TABLETS EVERY MORNING 180 tablet 0  .  traMADol (ULTRAM) 50 MG tablet tramadol 50 mg tablet  Take 1-2 tablet(s) EVERY 4-6 HOURS by oral route    . VENTOLIN HFA 108 (90 Base) MCG/ACT inhaler ONE PUFF FOUR TIMES DAILY AS NEEDED 18 g 3   Facility-Administered Medications Prior to Visit  Medication Dose Route Frequency Provider Last Rate Last Dose  . 0.9 %  sodium chloride infusion  500 mL Intravenous Continuous PIrene Shipper MD        ROS: Review of Systems  Constitutional: Positive for fatigue. Negative for activity change, appetite change, chills and unexpected weight change.  HENT: Negative for congestion, mouth sores and sinus pressure.   Eyes: Negative for visual disturbance.  Respiratory: Negative for cough and chest tightness.   Gastrointestinal: Negative for abdominal pain and nausea.  Genitourinary: Negative for difficulty urinating, frequency and vaginal pain.  Musculoskeletal: Positive for arthralgias, back pain, gait problem, neck pain and neck stiffness.  Skin: Negative for pallor and rash.  Neurological: Negative for dizziness, tremors, weakness, numbness and headaches.  Psychiatric/Behavioral: Positive for decreased concentration, dysphoric mood and sleep disturbance. Negative for confusion and suicidal ideas. The patient is nervous/anxious.     Objective:  BP 128/74 (BP Location: Left Arm, Patient Position: Sitting, Cuff Size:  Normal)   Pulse 75   Temp 98.3 F (36.8 C) (Oral)   Ht 5' 5"  (1.651 m)   Wt 156 lb (70.8 kg)   SpO2 98%   BMI 25.96 kg/m   BP Readings from Last 3 Encounters:  02/10/18 128/74  01/27/18 133/77  01/16/18 (!) 149/95    Wt Readings from Last 3 Encounters:  02/10/18 156 lb (70.8 kg)  01/27/18 159 lb (72.1 kg)  01/16/18 155 lb (70.3 kg)    Physical Exam  Constitutional: She appears well-developed. No distress.  HENT:  Head: Normocephalic.  Right Ear: External ear normal.  Left Ear: External ear normal.  Nose: Nose normal.  Mouth/Throat: Oropharynx is clear and moist.    Eyes: Pupils are equal, round, and reactive to light. Conjunctivae are normal. Right eye exhibits no discharge. Left eye exhibits no discharge.  Neck: Normal range of motion. Neck supple. No JVD present. No tracheal deviation present. No thyromegaly present.  Cardiovascular: Normal rate, regular rhythm and normal heart sounds.  Pulmonary/Chest: No stridor. No respiratory distress. She has no wheezes.  Abdominal: Soft. Bowel sounds are normal. She exhibits no distension and no mass. There is no tenderness. There is no rebound and no guarding.  Musculoskeletal: She exhibits tenderness. She exhibits no edema.  Lymphadenopathy:    She has no cervical adenopathy.  Neurological: She displays normal reflexes. No cranial nerve deficit. She exhibits normal muscle tone. Coordination abnormal.  Skin: No rash noted. No erythema.  Psychiatric: Her behavior is normal. Judgment and thought content normal.  LS tender Cane  Lab Results  Component Value Date   WBC 8.5 02/08/2016   HGB 12.1 02/08/2016   HCT 36.8 02/08/2016   PLT 338.0 02/08/2016   GLUCOSE 84 07/03/2016   CHOL 259 (H) 03/30/2014   TRIG 177.0 (H) 03/30/2014   HDL 47.40 03/30/2014   LDLDIRECT 182.5 02/02/2008   LDLCALC 176 (H) 03/30/2014   ALT 13 07/03/2016   AST 12 07/03/2016   NA 141 07/03/2016   K 4.0 07/03/2016   CL 108 07/03/2016   CREATININE 0.91 07/03/2016   BUN 18 07/03/2016   CO2 24 07/03/2016   TSH 2.42 07/03/2016   INR 1.0 11/25/2008   HGBA1C 5.5 04/24/2012    No results found.  Assessment & Plan:   There are no diagnoses linked to this encounter.   No orders of the defined types were placed in this encounter.    Follow-up: No follow-ups on file.  Walker Kehr, MD

## 2018-02-10 NOTE — Assessment & Plan Note (Signed)
Tramadol - not that helpful She will try CBD oil 

## 2018-02-10 NOTE — Assessment & Plan Note (Deleted)
Tramadol - not that helpful She will try CBD oil

## 2018-02-10 NOTE — Assessment & Plan Note (Signed)
Doing well 

## 2018-02-10 NOTE — Assessment & Plan Note (Signed)
Labs

## 2018-02-12 ENCOUNTER — Other Ambulatory Visit: Payer: Self-pay | Admitting: Internal Medicine

## 2018-02-12 ENCOUNTER — Ambulatory Visit: Payer: Medicare Other | Admitting: Physical Therapy

## 2018-02-12 MED ORDER — CIPROFLOXACIN HCL 250 MG PO TABS: 250.0000 mg | ORAL_TABLET | Freq: Two times a day (BID) | ORAL | 0 refills | Status: DC

## 2018-02-13 ENCOUNTER — Encounter: Payer: Self-pay | Admitting: Physical Therapy

## 2018-02-13 ENCOUNTER — Ambulatory Visit: Payer: Medicare Other | Admitting: Physical Therapy

## 2018-02-13 DIAGNOSIS — R293 Abnormal posture: Secondary | ICD-10-CM

## 2018-02-13 DIAGNOSIS — M25652 Stiffness of left hip, not elsewhere classified: Secondary | ICD-10-CM

## 2018-02-13 DIAGNOSIS — M6281 Muscle weakness (generalized): Secondary | ICD-10-CM

## 2018-02-13 DIAGNOSIS — M25661 Stiffness of right knee, not elsewhere classified: Secondary | ICD-10-CM

## 2018-02-13 DIAGNOSIS — M25651 Stiffness of right hip, not elsewhere classified: Secondary | ICD-10-CM | POA: Diagnosis not present

## 2018-02-13 DIAGNOSIS — R2689 Other abnormalities of gait and mobility: Secondary | ICD-10-CM | POA: Diagnosis not present

## 2018-02-13 NOTE — Therapy (Signed)
[place], [place], [address] Phone: [phone]   Fax:  [phone]  Physical Therapy Treatment  Patient Details  Name: [name] MRN: [id number] Date of Birth: [birth year] Referring Provider: Dr Alysia Penna   Encounter Date: 02/13/2018  PT End of Session - 02/13/18 1019    Visit Number  2    Number of Visits  12    Date for PT Re-Evaluation  03/17/18    Authorization Type  MCR    PT Start Time  1019    PT Stop Time  1124    PT Time Calculation (min)  65 min       Past Medical History:  Diagnosis Date  . Allergy   . Anxiety   . Asthma   . Attention deficit disorder without mention of hyperactivity   . Back pain   . Colitis 12/05/2010  . Colon polyps   . Depression   . Diverticulosis   . GERD (gastroesophageal reflux disease)   . H/O hiatal hernia   . Hypertension   . IBS (irritable bowel syndrome)   . Osteoarthrosis, unspecified whether generalized or localized, unspecified site   . Other and unspecified hyperlipidemia   . Renal cyst 10/2015   simple left  . Thyroid disease   . Ulcerative colitis   . Unspecified asthma(493.90)   . Unspecified essential hypertension    taken off meds since lost wt    Past Surgical History:  Procedure Laterality Date  . ABDOMINAL EXPOSURE N/A 04/22/2014   Procedure: ABDOMINAL EXPOSURE;  Surgeon: Angelia Mould, MD;  Location: [facility];  Service: Vascular;  Laterality: N/A;  . ANTERIOR LATERAL LUMBAR FUSION 4 LEVELS Left 04/22/2014   Procedure: Left Lumbar one/two, two/three, three/four, four,five. Anterior lateral lumbar interbody fusion**Stage 1**;  Surgeon: Erline Levine, MD;  Location: [facility];  Service: Neurosurgery;  Laterality: Left;  . ANTERIOR LUMBAR FUSION N/A 04/22/2014   Procedure: Lumbar five/-Sacral one. Anterior lumbar interbody fusion with Dr. Scot Dock; Left Lumbar one/two, two/three/three/four, four/five. Anterior  lateral lumbar interbody fusion**Stage 1**;  Surgeon: Erline Levine, MD;  Location: [facility];  Service: Neurosurgery;  Laterality: N/A;  . BACK SURGERY     fluid removed from between disks  . BREAST SURGERY     bil breast reduction   . CHOLECYSTECTOMY     Laparoscopic cholecystectomy  . COLONOSCOPY    . HIP FRACTURE SURGERY     left  11/01/2013  . LUMBAR PERCUTANEOUS PEDICLE SCREW 4 LEVEL N/A 04/25/2014   Procedure: **Stage 2** Percutaneous pedicle screw placement L1-5;  Surgeon: Erline Levine, MD;  Location: [facility];  Service: Neurosurgery;  Laterality: N/A;  **Stage 2** Percutaneous pedicle screw placement L1-5  . SHOULDER SURGERY     right shoulder 3x   . TOE SURGERY     right big toe  . TOTAL SHOULDER ARTHROPLASTY    . UPPER GASTROINTESTINAL ENDOSCOPY  09/28/2016   Dilation    There were no vitals filed for this visit.  Subjective Assessment - 02/13/18 1022    Subjective  Pt reports everything hurts today. Saw MD yesterday and she has a UTI and is going to start meds.  Yesterday she reports she was in bed all day because she feels crappy. She has felt so crummy that she hasn't done her HEP and would like toreview these. Using the TENs and it helps    Patient Stated Goals  get legs stronger, walk without  cane if it is possible using regular pattern    Currently in Pain?  Yes    Pain Score  7     Pain Location  Hip    Pain Orientation  Right    Pain Descriptors / Indicators  Aching;Sharp    Pain Type  Chronic pain    Pain Onset  More than a month ago    Pain Frequency  Constant    Aggravating Factors   everything lately    Pain Relieving Factors  gente motion                       [facility] Adult PT Treatment/Exercise - 02/13/18 0001      Self-Care   Self-Care  Other Self-Care Comments    Other Self-Care Comments   self trigger point release using a tennis ball on the wall       Exercises   Exercises  Lumbar      Lumbar Exercises: Aerobic   Nustep   L2x5' legs only      Lumbar Exercises: Prone   Other Prone Lumbar Exercises  10x5sec pelvic press, multiple VC for form and to stay on task      Knee/Hip Exercises: Seated   Long Arc Quad  Strengthening;Both;15 reps   5 sec holds     Knee/Hip Exercises: Sidelying   Clams  15 reps each side      Knee/Hip Exercises: Prone   Hip Extension  Both;15 reps   difficult -pt reports sore rib after her moving this week   Other Prone Exercises  10x5sec quad press      Modalities   Modalities  Electrical Stimulation;Moist Heat;Cryotherapy      Moist Heat Therapy   Number Minutes Moist Heat  15 Minutes    Moist Heat Location  Lumbar Spine      Cryotherapy   Number Minutes Cryotherapy  15 Minutes    Cryotherapy Location  Hip   lateral rt   Type of Cryotherapy  Ice pack      Electrical Stimulation   Electrical Stimulation Location  Rt gluts into lateral Rt thigh    Electrical Stimulation Action  IFC    Electrical Stimulation Parameters  to tolerance    Electrical Stimulation Goals  Tone;Pain      Manual Therapy   Manual Therapy  Soft tissue mobilization;Joint mobilization    Joint Mobilization  Rt hip mobs PA gradeIII with stretching into IR     Soft tissue mobilization  STM to Rt gluts, piriformis, ITB - mulitple trigger points palpable.                PT Short Term Goals - 02/03/18 1452      PT SHORT TERM GOAL #1   Title  I with initial HEP     Time  3    Period  Weeks    Status  New    Target Date  02/24/18      PT SHORT TERM GOAL #2   Title  report decrease pain in Rt hip =/> 25% with use of home TENs unit  to allow her to walk more    Time  3    Period  Weeks    Status  New      PT SHORT TERM GOAL #3   Title  increase Rt knee extension to =/better than -5 degrees to decrease amount of trendelenburg with gait    Time  3  Period  Weeks    Status  New    Target Date  02/24/18        PT Long Term Goals - 02/03/18 1453      PT LONG TERM GOAL #1   Title   I with advanced HEP to include a pool program    Time  6    Period  Weeks    Status  New    Target Date  03/17/18      PT LONG TERM GOAL #2   Title  increase bilat hip/knee strength =/> 4-4+/5 to assist with mobility    Time  6    Period  Weeks    Status  New    Target Date  03/17/18      PT LONG TERM GOAL #3   Title  improve FOTO =/< 52% limited    Time  6    Period  Weeks    Status  New    Target Date  03/17/18      PT LONG TERM GOAL #4   Title  improve bilat hip extension =/> 10 degrees to assist in normalizing gait and allow her to stand upright    Time  6    Period  Weeks    Status  New    Target Date  03/17/18      PT LONG TERM GOAL #5   Title  report =/> 50% improvement in abilty to perform her gardening     Time  6    Period  Weeks    Status  New    Target Date  03/17/18            Plan - 02/13/18 1218    Clinical Impression Statement  This is [name]'s second visit, she reports a lot of pain at the beginning of the session and she hasn't been able to perform her HEP as she has been sick.  She was dx with a UTI and will start an antibiotic today or tomorrow.  Hopefully she will begin to feel some better with this.  She did have decreased pain at the end of the session and did well with self trigger point release.  She would benefit from DN next visit to her Rt gluts/piriformis.     Rehab Potential  Good    PT Frequency  2x / week    PT Duration  6 weeks    PT Treatment/Interventions  Iontophoresis 4mg /ml Dexamethasone;Gait training;Stair training;Neuromuscular re-education;Dry needling;Manual techniques;Moist Heat;Ultrasound;Patient/family education;Taping;Therapeutic exercise;Cryotherapy;Electrical Stimulation    PT Next Visit Plan  DN, manual work to Rt gluts/hip rotators, stretching into hip extension and Rt knee extension, teach hip flexion stetches    Consulted and Agree with Plan of Care  Patient       Patient will benefit from skilled therapeutic  intervention in order to improve the following deficits and impairments:  Abnormal gait, Pain, Improper body mechanics, Increased muscle spasms, Decreased range of motion, Decreased strength, Difficulty walking  Visit Diagnosis: Muscle weakness (generalized)  Stiffness of right hip, not elsewhere classified  Stiffness of left hip, not elsewhere classified  Stiffness of right knee, not elsewhere classified  Abnormal posture  Other abnormalities of gait and mobility     Problem List Patient Active Problem List   Diagnosis Date Noted  . Preop exam for internal medicine 12/07/2017  . Chronic tonsillitis 12/05/2017  . Postlaminectomy syndrome, lumbar region 07/03/2017  . Chronic lumbar radiculopathy 07/03/2017  . Shingles outbreak 11/28/2016  .  Acute conjunctivitis of right eye 11/28/2016  . Vision blurred 11/28/2016  . Bilateral hearing loss 11/28/2016  . Nasal sore 11/28/2016  . Acute pharyngitis 07/03/2016  . UTI (urinary tract infection) 11/30/2015  . Abdominal pain 09/14/2015  . Nausea without vomiting 07/31/2015  . Chronic right hip pain 07/02/2015  . Sciatica of right side 06/30/2015  . Concussion without loss of consciousness 05/23/2015  . Low back pain radiating to left lower extremity 12/22/2014  . Dizziness 08/11/2014  . DDD (degenerative disc disease), lumbosacral 04/21/2014  . Renal cyst 01/28/2014  . Microscopic hematuria 04/20/2013  . Ulcerative colitis ([place]) 05/06/2012  . Spinal disease 03/29/2012  . Arthralgia 11/01/2011  . Osteoarthritis 08/17/2010  . GERD 08/17/2010  . Dyslipidemia 02/19/2008  . ALLERGIC RHINITIS CAUSE UNSPECIFIED 05/26/2007  . Hypothyroidism 03/21/2007  . Anxiety state 03/21/2007  . Depression 03/21/2007  . Attention deficit disorder of adult 03/21/2007  . Essential hypertension 03/21/2007  . Asthma 03/21/2007  . Irritable bowel syndrome 03/21/2007    Jeral Pinch PT  02/13/2018, 12:23 PM  [facility] [address] [place], [place], [address] Phone: [phone]   Fax:  [phone]  Name: [name] MRN: [id number] Date of Birth: 1945-12-31

## 2018-02-16 ENCOUNTER — Ambulatory Visit: Payer: Medicare Other | Admitting: Physical Therapy

## 2018-02-16 ENCOUNTER — Encounter: Payer: Self-pay | Admitting: Physical Therapy

## 2018-02-16 DIAGNOSIS — M25661 Stiffness of right knee, not elsewhere classified: Secondary | ICD-10-CM | POA: Diagnosis not present

## 2018-02-16 DIAGNOSIS — R293 Abnormal posture: Secondary | ICD-10-CM

## 2018-02-16 DIAGNOSIS — M6281 Muscle weakness (generalized): Secondary | ICD-10-CM | POA: Diagnosis not present

## 2018-02-16 DIAGNOSIS — M25652 Stiffness of left hip, not elsewhere classified: Secondary | ICD-10-CM | POA: Diagnosis not present

## 2018-02-16 DIAGNOSIS — M25651 Stiffness of right hip, not elsewhere classified: Secondary | ICD-10-CM

## 2018-02-16 DIAGNOSIS — R2689 Other abnormalities of gait and mobility: Secondary | ICD-10-CM

## 2018-02-16 NOTE — Therapy (Signed)
[name], [place], [address] Phone: [phone]   Fax:  [phone]  Physical Therapy Treatment  Patient Details  Name: [name] MRN: [id number] Date of Birth: 09-01-1945 Referring Provider: Dr Alysia Penna   Encounter Date: 02/16/2018  PT End of Session - 02/16/18 1105    Visit Number  3    Number of Visits  12    Date for PT Re-Evaluation  03/17/18    Authorization Type  MCR    PT Start Time  1105    PT Stop Time  1203    PT Time Calculation (min)  58 min       Past Medical History:  Diagnosis Date  . Allergy   . Anxiety   . Asthma   . Attention deficit disorder without mention of hyperactivity   . Back pain   . Colitis 12/05/2010  . Colon polyps   . Depression   . Diverticulosis   . GERD (gastroesophageal reflux disease)   . H/O hiatal hernia   . Hypertension   . IBS (irritable bowel syndrome)   . Osteoarthrosis, unspecified whether generalized or localized, unspecified site   . Other and unspecified hyperlipidemia   . Renal cyst 10/2015   simple left  . Thyroid disease   . Ulcerative colitis   . Unspecified asthma(493.90)   . Unspecified essential hypertension    taken off meds since lost wt    Past Surgical History:  Procedure Laterality Date  . ABDOMINAL EXPOSURE N/A 04/22/2014   Procedure: ABDOMINAL EXPOSURE;  Surgeon: Angelia Mould, MD;  Location: [place] [facility];  Service: Vascular;  Laterality: N/A;  . ANTERIOR LATERAL LUMBAR FUSION 4 LEVELS Left 04/22/2014   Procedure: Left Lumbar one/two, two/three, three/four, four,five. Anterior lateral lumbar interbody fusion**Stage 1**;  Surgeon: Erline Levine, MD;  Location: [place] [facility];  Service: Neurosurgery;  Laterality: Left;  . ANTERIOR LUMBAR FUSION N/A 04/22/2014   Procedure: Lumbar five/-Sacral one. Anterior lumbar interbody fusion with Dr. Scot Dock; Left Lumbar one/two, two/three/three/four, four/five. Anterior  lateral lumbar interbody fusion**Stage 1**;  Surgeon: Erline Levine, MD;  Location: [place] [facility];  Service: Neurosurgery;  Laterality: N/A;  . BACK SURGERY     fluid removed from between disks  . BREAST SURGERY     bil breast reduction   . CHOLECYSTECTOMY     Laparoscopic cholecystectomy  . COLONOSCOPY    . HIP FRACTURE SURGERY     left  11/01/2013  . LUMBAR PERCUTANEOUS PEDICLE SCREW 4 LEVEL N/A 04/25/2014   Procedure: **Stage 2** Percutaneous pedicle screw placement L1-5;  Surgeon: Erline Levine, MD;  Location: [place] [facility];  Service: Neurosurgery;  Laterality: N/A;  **Stage 2** Percutaneous pedicle screw placement L1-5  . SHOULDER SURGERY     right shoulder 3x   . TOE SURGERY     right big toe  . TOTAL SHOULDER ARTHROPLASTY    . UPPER GASTROINTESTINAL ENDOSCOPY  09/28/2016   Dilation    There were no vitals filed for this visit.  Subjective Assessment - 02/16/18 1107    Subjective  Pt reports she is doing well today, no aches and pains and hasn't used the TENs since last visit, still using the biofreeze. She has been on antibiotics for her UTI, and using the tennis ball for self trigger point release     Patient Stated Goals  get legs stronger, walk without cane if it is possible using regular pattern    Currently in  Pain?  No/denies         [facility] PT Assessment - 02/16/18 0001      Assessment   Medical Diagnosis  chronic LBP, Rt hip pain      AROM   Right Knee Extension  -12                   [facility] Adult PT Treatment/Exercise - 02/16/18 0001      Exercises   Exercises  Lumbar      Lumbar Exercises: Aerobic   Nustep  L5x5' arms and legs      Lumbar Exercises: Standing   Other Standing Lumbar Exercises  TKE ball presses into a wall      Lumbar Exercises: Prone   Other Prone Lumbar Exercises  10x5sec quad sets      Modalities   Modalities  Electrical Stimulation;Moist Heat;Cryotherapy      Moist Heat Therapy   Number Minutes Moist Heat  15 Minutes     Moist Heat Location  --   buttocks     Cryotherapy   Cryotherapy Location  --   buttocks     Electrical Stimulation   Electrical Stimulation Location  bilat glut    Electrical Stimulation Action  IFC    Electrical Stimulation Parameters  to tolerance    Electrical Stimulation Goals  Tone;Pain      Manual Therapy   Manual Therapy  Soft tissue mobilization;Joint mobilization    Joint Mobilization  Rt hip mobs PA gradeIII with stretching into IR and extension   Soft tissue mobilization  STM to bilat gluts, piriformis, ITB - mulitple trigger points palpable.        Trigger Point Dry Needling - 02/16/18 1139    Consent Given?  Yes    Education Handout Provided  Yes    Muscles Treated Lower Body  Piriformis;Gluteus maximus;Gluteus minimus   bilat   Gluteus Maximus Response  Palpable increased muscle length;Twitch response elicited    Gluteus Minimus Response  Palpable increased muscle length;Twitch response elicited    Piriformis Response  Palpable increased muscle length;Twitch response elicited   Lt tighter than Rt             PT Short Term Goals - 02/16/18 1108      PT SHORT TERM GOAL #1   Title  I with initial HEP     Status  Achieved      PT SHORT TERM GOAL #2   Title  report decrease pain in Rt hip =/> 25% with use of home TENs unit  to allow her to walk more    Status  Achieved      PT SHORT TERM GOAL #3   Title  increase Rt knee extension to =/better than -5 degrees to decrease amount of trendelenburg with gait    Status  On-going        PT Long Term Goals - 02/03/18 1453      PT LONG TERM GOAL #1   Title  I with advanced HEP to include a pool program    Time  6    Period  Weeks    Status  New    Target Date  03/17/18      PT LONG TERM GOAL #2   Title  increase bilat hip/knee strength =/> 4-4+/5 to assist with mobility    Time  6    Period  Weeks    Status  New    Target Date  03/17/18  PT LONG TERM GOAL #3   Title  improve FOTO =/< 52%  limited    Time  6    Period  Weeks    Status  New    Target Date  03/17/18      PT LONG TERM GOAL #4   Title  improve bilat hip extension =/> 10 degrees to assist in normalizing gait and allow her to stand upright    Time  6    Period  Weeks    Status  New    Target Date  03/17/18      PT LONG TERM GOAL #5   Title  report =/> 50% improvement in abilty to perform her gardening     Time  6    Period  Weeks    Status  New    Target Date  03/17/18            Plan - 02/16/18 1240    Clinical Impression Statement  Gwenn is feeling alot better today compared to last visit, this is most likely due to antibiotics for UTI.  She has slight improved knee extension and hip extension.  She responded well to manual work today with decreased palpable tightness after .  Making slow progress to goals, UTI slowed her down.     Rehab Potential  Good    PT Frequency  2x / week    PT Duration  6 weeks    PT Treatment/Interventions  Iontophoresis 88m/ml Dexamethasone;Gait training;Stair training;Neuromuscular re-education;Dry needling;Manual techniques;Moist Heat;Ultrasound;Patient/family education;Taping;Therapeutic exercise;Cryotherapy;Electrical Stimulation    PT Next Visit Plan  DN, manual work to Rt gluts/hip rotators, stretching into hip extension and Rt knee extension, teach hip flexion stetches    Consulted and Agree with Plan of Care  Patient       Patient will benefit from skilled therapeutic intervention in order to improve the following deficits and impairments:  Abnormal gait, Pain, Improper body mechanics, Increased muscle spasms, Decreased range of motion, Decreased strength, Difficulty walking  Visit Diagnosis: Muscle weakness (generalized)  Stiffness of right hip, not elsewhere classified  Stiffness of left hip, not elsewhere classified  Stiffness of right knee, not elsewhere classified  Abnormal posture  Other abnormalities of gait and mobility     Problem  List Patient Active Problem List   Diagnosis Date Noted  . Preop exam for internal medicine 12/07/2017  . Chronic tonsillitis 12/05/2017  . Postlaminectomy syndrome, lumbar region 07/03/2017  . Chronic lumbar radiculopathy 07/03/2017  . Shingles outbreak 11/28/2016  . Acute conjunctivitis of right eye 11/28/2016  . Vision blurred 11/28/2016  . Bilateral hearing loss 11/28/2016  . Nasal sore 11/28/2016  . Acute pharyngitis 07/03/2016  . UTI (urinary tract infection) 11/30/2015  . Abdominal pain 09/14/2015  . Nausea without vomiting 07/31/2015  . Chronic right hip pain 07/02/2015  . Sciatica of right side 06/30/2015  . Concussion without loss of consciousness 05/23/2015  . Low back pain radiating to left lower extremity 12/22/2014  . Dizziness 08/11/2014  . DDD (degenerative disc disease), lumbosacral 04/21/2014  . Renal cyst 01/28/2014  . Microscopic hematuria 04/20/2013  . Ulcerative colitis ([place] 05/06/2012  . Spinal disease 03/29/2012  . Arthralgia 11/01/2011  . Osteoarthritis 08/17/2010  . GERD 08/17/2010  . Dyslipidemia 02/19/2008  . ALLERGIC RHINITIS CAUSE UNSPECIFIED 05/26/2007  . Hypothyroidism 03/21/2007  . Anxiety state 03/21/2007  . Depression 03/21/2007  . Attention deficit disorder of adult 03/21/2007  . Essential hypertension 03/21/2007  . Asthma 03/21/2007  . Irritable  bowel syndrome 03/21/2007    Jeral Pinch PT 02/16/2018, 12:44 PM  [facility] [address] [place], [place], [address] Phone: [phone]   Fax:  [phone]  Name: CECELY RENGEL MRN: [id number] Date of Birth: 1945/12/23

## 2018-02-18 ENCOUNTER — Ambulatory Visit: Payer: Medicare Other | Admitting: Physical Therapy

## 2018-02-18 ENCOUNTER — Encounter: Payer: Self-pay | Admitting: Physical Therapy

## 2018-02-18 DIAGNOSIS — R293 Abnormal posture: Secondary | ICD-10-CM | POA: Diagnosis not present

## 2018-02-18 DIAGNOSIS — M25652 Stiffness of left hip, not elsewhere classified: Secondary | ICD-10-CM

## 2018-02-18 DIAGNOSIS — M6281 Muscle weakness (generalized): Secondary | ICD-10-CM

## 2018-02-18 DIAGNOSIS — R2689 Other abnormalities of gait and mobility: Secondary | ICD-10-CM

## 2018-02-18 DIAGNOSIS — M25651 Stiffness of right hip, not elsewhere classified: Secondary | ICD-10-CM | POA: Diagnosis not present

## 2018-02-18 DIAGNOSIS — M25661 Stiffness of right knee, not elsewhere classified: Secondary | ICD-10-CM

## 2018-02-18 DIAGNOSIS — K5190 Ulcerative colitis, unspecified, without complications: Secondary | ICD-10-CM | POA: Diagnosis not present

## 2018-02-18 NOTE — Therapy (Signed)
[place], [place], [address] Phone: [phone]   Fax:  [phone]  Physical Therapy Treatment  Patient Details  Name: [name] MRN: [id number] Date of Birth: [birth year] Referring Provider: Dr Alysia Penna   Encounter Date: 02/18/2018  PT End of Session - 02/18/18 1100    Visit Number  4    Number of Visits  12    Date for PT Re-Evaluation  03/17/18    Authorization Type  MCR    PT Start Time  1101    PT Stop Time  1206    PT Time Calculation (min)  65 min    Activity Tolerance  Patient tolerated treatment well       Past Medical History:  Diagnosis Date  . Allergy   . Anxiety   . Asthma   . Attention deficit disorder without mention of hyperactivity   . Back pain   . Colitis 12/05/2010  . Colon polyps   . Depression   . Diverticulosis   . GERD (gastroesophageal reflux disease)   . H/O hiatal hernia   . Hypertension   . IBS (irritable bowel syndrome)   . Osteoarthrosis, unspecified whether generalized or localized, unspecified site   . Other and unspecified hyperlipidemia   . Renal cyst 10/2015   simple left  . Thyroid disease   . Ulcerative colitis   . Unspecified asthma(493.90)   . Unspecified essential hypertension    taken off meds since lost wt    Past Surgical History:  Procedure Laterality Date  . ABDOMINAL EXPOSURE N/A 04/22/2014   Procedure: ABDOMINAL EXPOSURE;  Surgeon: Angelia Mould, MD;  Location: [place] [facility];  Service: Vascular;  Laterality: N/A;  . ANTERIOR LATERAL LUMBAR FUSION 4 LEVELS Left 04/22/2014   Procedure: Left Lumbar one/two, two/three, three/four, four,five. Anterior lateral lumbar interbody fusion**Stage 1**;  Surgeon: Erline Levine, MD;  Location: [place] [facility];  Service: Neurosurgery;  Laterality: Left;  . ANTERIOR LUMBAR FUSION N/A 04/22/2014   Procedure: Lumbar five/-Sacral one. Anterior lumbar interbody fusion with Dr. Scot Dock; Left  Lumbar one/two, two/three/three/four, four/five. Anterior lateral lumbar interbody fusion**Stage 1**;  Surgeon: Erline Levine, MD;  Location: [place] [facility];  Service: Neurosurgery;  Laterality: N/A;  . BACK SURGERY     fluid removed from between disks  . BREAST SURGERY     bil breast reduction   . CHOLECYSTECTOMY     Laparoscopic cholecystectomy  . COLONOSCOPY    . HIP FRACTURE SURGERY     left  11/01/2013  . LUMBAR PERCUTANEOUS PEDICLE SCREW 4 LEVEL N/A 04/25/2014   Procedure: **Stage 2** Percutaneous pedicle screw placement L1-5;  Surgeon: Erline Levine, MD;  Location: [place] [facility];  Service: Neurosurgery;  Laterality: N/A;  **Stage 2** Percutaneous pedicle screw placement L1-5  . SHOULDER SURGERY     right shoulder 3x   . TOE SURGERY     right big toe  . TOTAL SHOULDER ARTHROPLASTY    . UPPER GASTROINTESTINAL ENDOSCOPY  09/28/2016   Dilation    There were no vitals filed for this visit.  Subjective Assessment - 02/18/18 1103    Subjective  Pt reports she was feeling good yesterday so she got on the ground and did some gardening.  She was sore last night however better today.     Patient Stated Goals  get legs stronger, walk without cane if it is possible using regular pattern    Currently in Pain?  No/denies  [facility] Adult PT Treatment/Exercise - 02/18/18 0001      Exercises   Exercises  Lumbar      Lumbar Exercises: Stretches   Double Knee to Chest Stretch  2 reps;30 seconds   using pillow case behind knees   Lower Trunk Rotation  2 reps;30 seconds   each side   ITB Stretch  Left;Right;2 reps;20 seconds   in figure 4    Other Lumbar Stretch Exercise  seated posterior pelvic tilt to stretch out low back      Lumbar Exercises: Aerobic   Nustep  L5x5' arms and legs      Lumbar Exercises: Supine   Bridge  --   2x10 with green band around knees, VC for alignment     Lumbar Exercises: Sidelying   Clam  Both;20 reps   green band regular  and reverse     Modalities   Modalities  Electrical Stimulation;Moist Heat;Cryotherapy      Moist Heat Therapy   Number Minutes Moist Heat  15 Minutes    Moist Heat Location  Lumbar Spine      Electrical Stimulation   Electrical Stimulation Location  lumbar and gluts    Electrical Stimulation Action  IFC    Electrical Stimulation Parameters  to tolerance    Electrical Stimulation Goals  Tone;Pain      Manual Therapy   Manual Therapy  Soft tissue mobilization;Joint mobilization    Joint Mobilization  Rt hip mobs PA gradeIII hip lifted into extension, then with stretching into IR     Soft tissue mobilization  STM to bilat gluts minimal to no trigger points in  Lt , few in RT - pt released very quickly                PT Short Term Goals - 02/16/18 1108      PT SHORT TERM GOAL #1   Title  I with initial HEP     Status  Achieved      PT SHORT TERM GOAL #2   Title  report decrease pain in Rt hip =/> 25% with use of home TENs unit  to allow her to walk more    Status  Achieved      PT SHORT TERM GOAL #3   Title  increase Rt knee extension to =/better than -5 degrees to decrease amount of trendelenburg with gait    Status  On-going        PT Long Term Goals - 02/03/18 1453      PT LONG TERM GOAL #1   Title  I with advanced HEP to include a pool program    Time  6    Period  Weeks    Status  New    Target Date  03/17/18      PT LONG TERM GOAL #2   Title  increase bilat hip/knee strength =/> 4-4+/5 to assist with mobility    Time  6    Period  Weeks    Status  New    Target Date  03/17/18      PT LONG TERM GOAL #3   Title  improve FOTO =/< 52% limited    Time  6    Period  Weeks    Status  New    Target Date  03/17/18      PT LONG TERM GOAL #4   Title  improve bilat hip extension =/> 10 degrees to assist in normalizing gait and allow her  to stand upright    Time  6    Period  Weeks    Status  New    Target Date  03/17/18      PT LONG TERM GOAL #5    Title  report =/> 50% improvement in abilty to perform her gardening     Time  6    Period  Weeks    Status  New    Target Date  03/17/18            Plan - 02/18/18 1217    Clinical Impression Statement  Queen continues to make progress. she is feeling better enough where she was able to perform some gardening.  The Rt gluts are not as tight and her hip rotation and extension are moving further with passive stretches.     Rehab Potential  Good    PT Frequency  2x / week    PT Duration  6 weeks    PT Treatment/Interventions  Iontophoresis 4mg /ml Dexamethasone;Gait training;Stair training;Neuromuscular re-education;Dry needling;Manual techniques;Moist Heat;Ultrasound;Patient/family education;Taping;Therapeutic exercise;Cryotherapy;Electrical Stimulation    PT Next Visit Plan  slowly progress HEP, continue with manual work to gluts/low back and modalities PRN for pain    Consulted and Agree with Plan of Care  Patient       Patient will benefit from skilled therapeutic intervention in order to improve the following deficits and impairments:  Abnormal gait, Pain, Improper body mechanics, Increased muscle spasms, Decreased range of motion, Decreased strength, Difficulty walking  Visit Diagnosis: Muscle weakness (generalized)  Stiffness of right hip, not elsewhere classified  Stiffness of left hip, not elsewhere classified  Stiffness of right knee, not elsewhere classified  Abnormal posture  Other abnormalities of gait and mobility     Problem List Patient Active Problem List   Diagnosis Date Noted  . Preop exam for internal medicine 12/07/2017  . Chronic tonsillitis 12/05/2017  . Postlaminectomy syndrome, lumbar region 07/03/2017  . Chronic lumbar radiculopathy 07/03/2017  . Shingles outbreak 11/28/2016  . Acute conjunctivitis of right eye 11/28/2016  . Vision blurred 11/28/2016  . Bilateral hearing loss 11/28/2016  . Nasal sore 11/28/2016  . Acute pharyngitis  07/03/2016  . UTI (urinary tract infection) 11/30/2015  . Abdominal pain 09/14/2015  . Nausea without vomiting 07/31/2015  . Chronic right hip pain 07/02/2015  . Sciatica of right side 06/30/2015  . Concussion without loss of consciousness 05/23/2015  . Low back pain radiating to left lower extremity 12/22/2014  . Dizziness 08/11/2014  . DDD (degenerative disc disease), lumbosacral 04/21/2014  . Renal cyst 01/28/2014  . Microscopic hematuria 04/20/2013  . Ulcerative colitis ([place]) 05/06/2012  . Spinal disease 03/29/2012  . Arthralgia 11/01/2011  . Osteoarthritis 08/17/2010  . GERD 08/17/2010  . Dyslipidemia 02/19/2008  . ALLERGIC RHINITIS CAUSE UNSPECIFIED 05/26/2007  . Hypothyroidism 03/21/2007  . Anxiety state 03/21/2007  . Depression 03/21/2007  . Attention deficit disorder of adult 03/21/2007  . Essential hypertension 03/21/2007  . Asthma 03/21/2007  . Irritable bowel syndrome 03/21/2007    Jeral Pinch PT  02/18/2018, 12:22 PM  [facility] [address] [place], [place], [address] Phone: [phone]   Fax:  [phone]  Name: SALAH BURLISON MRN: [id number] Date of Birth: 1945/11/06

## 2018-02-24 ENCOUNTER — Ambulatory Visit: Payer: Medicare Other | Attending: Physical Medicine & Rehabilitation | Admitting: Physical Therapy

## 2018-02-24 ENCOUNTER — Encounter: Payer: Self-pay | Admitting: Physical Therapy

## 2018-02-24 DIAGNOSIS — R2689 Other abnormalities of gait and mobility: Secondary | ICD-10-CM

## 2018-02-24 DIAGNOSIS — M25652 Stiffness of left hip, not elsewhere classified: Secondary | ICD-10-CM | POA: Diagnosis not present

## 2018-02-24 DIAGNOSIS — M6281 Muscle weakness (generalized): Secondary | ICD-10-CM

## 2018-02-24 DIAGNOSIS — M25651 Stiffness of right hip, not elsewhere classified: Secondary | ICD-10-CM

## 2018-02-24 DIAGNOSIS — M25661 Stiffness of right knee, not elsewhere classified: Secondary | ICD-10-CM | POA: Diagnosis not present

## 2018-02-24 DIAGNOSIS — R293 Abnormal posture: Secondary | ICD-10-CM | POA: Diagnosis not present

## 2018-02-25 NOTE — Therapy (Signed)
[place], [place], [address] Phone: [phone]   Fax:  [phone]  Physical Therapy Treatment  Patient Details  Name: [name] MRN: [id number] Date of Birth: 1946-04-28 Referring Provider: Dr Alysia Penna   Encounter Date: 02/24/2018  PT End of Session - 02/24/18 1356    Visit Number  5    Number of Visits  12    Date for PT Re-Evaluation  03/17/18    Authorization Type  MCR    PT Start Time  1337   Pt  7 minutes late for her visit    PT Stop Time  1415    PT Time Calculation (min)  38 min    Activity Tolerance  Patient tolerated treatment well       Past Medical History:  Diagnosis Date  . Allergy   . Anxiety   . Asthma   . Attention deficit disorder without mention of hyperactivity   . Back pain   . Colitis 12/05/2010  . Colon polyps   . Depression   . Diverticulosis   . GERD (gastroesophageal reflux disease)   . H/O hiatal hernia   . Hypertension   . IBS (irritable bowel syndrome)   . Osteoarthrosis, unspecified whether generalized or localized, unspecified site   . Other and unspecified hyperlipidemia   . Renal cyst 10/2015   simple left  . Thyroid disease   . Ulcerative colitis   . Unspecified asthma(493.90)   . Unspecified essential hypertension    taken off meds since lost wt    Past Surgical History:  Procedure Laterality Date  . ABDOMINAL EXPOSURE N/A 04/22/2014   Procedure: ABDOMINAL EXPOSURE;  Surgeon: Angelia Mould, MD;  Location: [facility];  Service: Vascular;  Laterality: N/A;  . ANTERIOR LATERAL LUMBAR FUSION 4 LEVELS Left 04/22/2014   Procedure: Left Lumbar one/two, two/three, three/four, four,five. Anterior lateral lumbar interbody fusion**Stage 1**;  Surgeon: Erline Levine, MD;  Location: [facility];  Service: Neurosurgery;  Laterality: Left;  . ANTERIOR LUMBAR FUSION N/A 04/22/2014   Procedure: Lumbar five/-Sacral one. Anterior lumbar  interbody fusion with Dr. Scot Dock; Left Lumbar one/two, two/three/three/four, four/five. Anterior lateral lumbar interbody fusion**Stage 1**;  Surgeon: Erline Levine, MD;  Location: [facility];  Service: Neurosurgery;  Laterality: N/A;  . BACK SURGERY     fluid removed from between disks  . BREAST SURGERY     bil breast reduction   . CHOLECYSTECTOMY     Laparoscopic cholecystectomy  . COLONOSCOPY    . HIP FRACTURE SURGERY     left  11/01/2013  . LUMBAR PERCUTANEOUS PEDICLE SCREW 4 LEVEL N/A 04/25/2014   Procedure: **Stage 2** Percutaneous pedicle screw placement L1-5;  Surgeon: Erline Levine, MD;  Location: [facility];  Service: Neurosurgery;  Laterality: N/A;  **Stage 2** Percutaneous pedicle screw placement L1-5  . SHOULDER SURGERY     right shoulder 3x   . TOE SURGERY     right big toe  . TOTAL SHOULDER ARTHROPLASTY    . UPPER GASTROINTESTINAL ENDOSCOPY  09/28/2016   Dilation    There were no vitals filed for this visit.  Subjective Assessment - 02/24/18 1342    Subjective  Patient had 2 falls yestetrday. One fall she hurt her ankles and 1 fall she hurt her ribs. She is having no pain in her back.     Pertinent History  Lt THA 2015, needs Rt THA - pt holding off on this, Rt knee  pain - bone on bone    How long can you sit comfortably?  tolerated 5' combination of hip bursitis and back    How long can you stand comfortably?  5'    How long can you walk comfortably?  ambulates with [place]    Diagnostic tests  MRI, xrays and lots of things    Patient Stated Goals  get legs stronger, walk without cane if it is possible using regular pattern    Currently in Pain?  Yes    Pain Score  5     Pain Location  Ankle    Pain Orientation  Right    Pain Descriptors / Indicators  Aching;Sharp    Pain Type  Chronic pain    Pain Onset  More than a month ago    Pain Frequency  Constant    Aggravating Factors   fall     Pain Relieving Factors  gentle motion                         [facility] Adult PT Treatment/Exercise - 02/25/18 0001      Lumbar Exercises: Stretches   Double Knee to Chest Stretch  2 reps;30 seconds   using pillow case behind knees   Lower Trunk Rotation  2 reps;30 seconds   each side   ITB Stretch  Left;Right;2 reps;20 seconds   in figure 4    Other Lumbar Stretch Exercise  seated posterior pelvic tilt to stretch out low back      Lumbar Exercises: Aerobic   Nustep  L5x5' arms and legs      Lumbar Exercises: Sidelying   Clam  Both;20 reps   green band regular and reverse     Moist Heat Therapy   Number Minutes Moist Heat  10 Minutes    Moist Heat Location  Lumbar Spine      Manual Therapy   Manual Therapy  Soft tissue mobilization;Joint mobilization    Joint Mobilization  Rt hip mobs PA gradeIII hip lifted into extension, then with stretching into IR     Soft tissue mobilization  STM to bilat gluts minimal to no trigger points in  Lt , few in RT - pt released very quickly              PT Education - 02/24/18 1350    Education Details  HEP, reviewed ther-ex.     Person(s) Educated  Patient    Methods  Explanation;Demonstration;Tactile cues;Verbal cues    Comprehension  Verbalized understanding;Returned demonstration;Verbal cues required;Tactile cues required       PT Short Term Goals - 02/24/18 1401      PT SHORT TERM GOAL #1   Title  I with initial HEP     Time  3    Period  Weeks    Status  Achieved      PT SHORT TERM GOAL #2   Title  report decrease pain in Rt hip =/> 25% with use of home TENs unit  to allow her to walk more    Time  3    Period  Weeks    Status  On-going      PT SHORT TERM GOAL #3   Title  increase Rt knee extension to =/better than -5 degrees to decrease amount of trendelenburg with gait    Time  3    Period  Weeks    Status  On-going  PT Long Term Goals - 02/03/18 1453      PT LONG TERM GOAL #1   Title  I with advanced HEP to include a pool program     Time  6    Period  Weeks    Status  New    Target Date  03/17/18      PT LONG TERM GOAL #2   Title  increase bilat hip/knee strength =/> 4-4+/5 to assist with mobility    Time  6    Period  Weeks    Status  New    Target Date  03/17/18      PT LONG TERM GOAL #3   Title  improve FOTO =/< 52% limited    Time  6    Period  Weeks    Status  New    Target Date  03/17/18      PT LONG TERM GOAL #4   Title  improve bilat hip extension =/> 10 degrees to assist in normalizing gait and allow her to stand upright    Time  6    Period  Weeks    Status  New    Target Date  03/17/18      PT LONG TERM GOAL #5   Title  report =/> 50% improvement in abilty to perform her gardening     Time  6    Period  Weeks    Status  New    Target Date  03/17/18            Plan - 02/24/18 1359    Clinical Impression Statement  Desite coming in reporting fatigue and pain in her ankle the patient tolerated treatment well. She had no increase in pain with her treatment. Therapy focused on ther-ex and manual therapy of her hip.     Clinical Presentation  Evolving    Clinical Decision Making  Moderate    Rehab Potential  Good    PT Frequency  2x / week    PT Duration  6 weeks    PT Treatment/Interventions  Iontophoresis 4mg /ml Dexamethasone;Gait training;Stair training;Neuromuscular re-education;Dry needling;Manual techniques;Moist Heat;Ultrasound;Patient/family education;Taping;Therapeutic exercise;Cryotherapy;Electrical Stimulation    PT Next Visit Plan  slowly progress HEP, continue with manual work to gluts/low back and modalities PRN for pain    Consulted and Agree with Plan of Care  Patient       Patient will benefit from skilled therapeutic intervention in order to improve the following deficits and impairments:  Abnormal gait, Pain, Improper body mechanics, Increased muscle spasms, Decreased range of motion, Decreased strength, Difficulty walking  Visit Diagnosis: Muscle weakness  (generalized)  Stiffness of right hip, not elsewhere classified  Stiffness of left hip, not elsewhere classified  Stiffness of right knee, not elsewhere classified  Abnormal posture  Other abnormalities of gait and mobility     Problem List Patient Active Problem List   Diagnosis Date Noted  . Preop exam for internal medicine 12/07/2017  . Chronic tonsillitis 12/05/2017  . Postlaminectomy syndrome, lumbar region 07/03/2017  . Chronic lumbar radiculopathy 07/03/2017  . Shingles outbreak 11/28/2016  . Acute conjunctivitis of right eye 11/28/2016  . Vision blurred 11/28/2016  . Bilateral hearing loss 11/28/2016  . Nasal sore 11/28/2016  . Acute pharyngitis 07/03/2016  . UTI (urinary tract infection) 11/30/2015  . Abdominal pain 09/14/2015  . Nausea without vomiting 07/31/2015  . Chronic right hip pain 07/02/2015  . Sciatica of right side 06/30/2015  . Concussion without loss of consciousness  05/23/2015  . Low back pain radiating to left lower extremity 12/22/2014  . Dizziness 08/11/2014  . DDD (degenerative disc disease), lumbosacral 04/21/2014  . Renal cyst 01/28/2014  . Microscopic hematuria 04/20/2013  . Ulcerative colitis ([place]) 05/06/2012  . Spinal disease 03/29/2012  . Arthralgia 11/01/2011  . Osteoarthritis 08/17/2010  . GERD 08/17/2010  . Dyslipidemia 02/19/2008  . ALLERGIC RHINITIS CAUSE UNSPECIFIED 05/26/2007  . Hypothyroidism 03/21/2007  . Anxiety state 03/21/2007  . Depression 03/21/2007  . Attention deficit disorder of adult 03/21/2007  . Essential hypertension 03/21/2007  . Asthma 03/21/2007  . Irritable bowel syndrome 03/21/2007    Carney Living PT DPT  02/25/2018, 9:12 AM  [facility] [address] [place], [place], [address] Phone: [phone]   Fax:  [phone]  Name: [name] MRN: [id number] Date of Birth: 1945-10-01

## 2018-02-26 ENCOUNTER — Ambulatory Visit: Payer: Medicare Other | Admitting: Physical Therapy

## 2018-02-27 ENCOUNTER — Ambulatory Visit (HOSPITAL_BASED_OUTPATIENT_CLINIC_OR_DEPARTMENT_OTHER): Payer: Medicare Other | Admitting: Physical Medicine & Rehabilitation

## 2018-02-27 ENCOUNTER — Encounter: Payer: Self-pay | Admitting: Physical Medicine & Rehabilitation

## 2018-02-27 ENCOUNTER — Encounter: Payer: Medicare Other | Attending: Physical Medicine & Rehabilitation

## 2018-02-27 VITALS — BP 136/77 | HR 74 | Resp 14 | Ht 65.0 in | Wt 157.0 lb

## 2018-02-27 DIAGNOSIS — M533 Sacrococcygeal disorders, not elsewhere classified: Secondary | ICD-10-CM

## 2018-02-27 DIAGNOSIS — M5416 Radiculopathy, lumbar region: Secondary | ICD-10-CM | POA: Insufficient documentation

## 2018-02-27 DIAGNOSIS — G8929 Other chronic pain: Secondary | ICD-10-CM

## 2018-02-27 DIAGNOSIS — M47816 Spondylosis without myelopathy or radiculopathy, lumbar region: Secondary | ICD-10-CM | POA: Diagnosis not present

## 2018-02-27 DIAGNOSIS — M961 Postlaminectomy syndrome, not elsewhere classified: Secondary | ICD-10-CM | POA: Diagnosis not present

## 2018-02-27 MED ORDER — GABAPENTIN 400 MG PO CAPS: 400.0000 mg | ORAL_CAPSULE | Freq: Three times a day (TID) | ORAL | 0 refills | Status: DC

## 2018-02-27 NOTE — Patient Instructions (Signed)
You had a radio frequency procedure today This was done to alleviate joint pain in your lumbar area We injected lidocaine which is a local anesthetic.  You may experience soreness at the injection sites. You may also experienced some irritation of the nerves that were heated I'm recommending ice for 30 minutes every 2 hours as needed for the next 24-48 hours In addition he will be taking gabapentin 600 mg 3 times a day today only if this is not one of your usual medicines or if you are not on Lyrica

## 2018-02-27 NOTE — Progress Notes (Signed)
  PROCEDURE RECORD [facility]   Name: [name] DOB:July 21, 1945 MRN: [id number]  Date:02/27/2018  Physician: Alysia Penna, MD    Nurse/CMA: Wessling, CMA  Allergies:  Allergies  Allergen Reactions  . Ampicillin Nausea And Vomiting  . Erythromycin Nausea And Vomiting and Other (See Comments)    Can take Z pac  . Hctz [Hydrochlorothiazide] Other (See Comments)    dizzy  . Metoprolol Other (See Comments)    doublevision   . Molds & Smuts Other (See Comments)    Pt coughs and uses an inhaler.  . Morphine And Related Nausea And Vomiting  . Penicillin G Other (See Comments)    Unknown Has patient had a PCN reaction causing immediate rash, facial/tongue/throat swelling, SOB or lightheadedness with hypotension: Unknown Has patient had a PCN reaction causing severe rash involving mucus membranes or skin necrosis: Unknown Has patient had a PCN reaction that required hospitalization: Unknown Has patient had a PCN reaction occurring within the last 10 years: Unknown If all of the above answers are "NO", then may proceed with Cephalosporin use.     Consent Signed: Yes.    Is patient diabetic? No.  CBG today?   Pregnant: No. LMP: No LMP recorded. Patient is postmenopausal. (age 84-55)  Anticoagulants: no Anti-inflammatory: no Antibiotics: no  Procedure: Right L5, S1-3 radiofrequency  Position: Prone Start Time:  1:32PM     End Time:1:58pm   Fluoro Time: 49s  RN/CMA Wessling, CMA Wessling, CMA    Time 1:20pm 2:05pm    BP 136/77 149/75    Pulse 74 70    Respirations 14 14    O2 Sat 95 96    S/S 6 6    Pain Level 3/10 0/10     D/C home with husband patient A & O X 3, D/C instructions reviewed, and sits independently.      L5 dorsal ramus S1-S2-S3 lateral branch blocks under fluoroscopic guidance Right side  Informed consent was obtained after describing risks and benefits of the procedure with patient these include bleeding  bruising and infection.  He elects to proceed and has given written consent.  Patient placed prone on fluoroscopy table Betadine prep sterile drape a 25-gauge 1.5 inch needle was used to anesthetize skin and subcu tissue with 1% lidocaine 1 cc into each of 4 sites.  Then a 22-gauge 3.5" needle was inserted under fluoroscopic guidance for starting the S1 SAP sacral ala junction.  Bone contact made.  Isovue 200 x 0.5 mL demonstrated no intravascular uptake then 0.5 mL of 2% lidocaine was injected.  Then the lateral aspect of the S1, S2, S3 foramen was targeted.  Bone contact made out, Isovue-200 times 0.5 mL demonstrated no nerve root or intravascular uptake within 0.5 mL of 2% lidocaine solution was injected after negative drawback for blood.  Patient tolerated procedure well.  Postinjection instructions given.

## 2018-03-03 ENCOUNTER — Encounter: Payer: Self-pay | Admitting: Physical Therapy

## 2018-03-03 ENCOUNTER — Ambulatory Visit: Payer: Medicare Other | Admitting: Physical Therapy

## 2018-03-03 DIAGNOSIS — M25651 Stiffness of right hip, not elsewhere classified: Secondary | ICD-10-CM

## 2018-03-03 DIAGNOSIS — M25661 Stiffness of right knee, not elsewhere classified: Secondary | ICD-10-CM | POA: Diagnosis not present

## 2018-03-03 DIAGNOSIS — R293 Abnormal posture: Secondary | ICD-10-CM | POA: Diagnosis not present

## 2018-03-03 DIAGNOSIS — M6281 Muscle weakness (generalized): Secondary | ICD-10-CM | POA: Diagnosis not present

## 2018-03-03 DIAGNOSIS — M25652 Stiffness of left hip, not elsewhere classified: Secondary | ICD-10-CM | POA: Diagnosis not present

## 2018-03-03 DIAGNOSIS — R2689 Other abnormalities of gait and mobility: Secondary | ICD-10-CM

## 2018-03-03 NOTE — Therapy (Signed)
[place], [place], [address] Phone: [phone]   Fax:  [phone]  Physical Therapy Treatment  Patient Details  Name: [name] MRN: [id number] Date of Birth: 07/15/1945 Referring Provider: Dr Alysia Penna   Encounter Date: 03/03/2018  PT End of Session - 03/03/18 1336    Visit Number  6    Number of Visits  12    Date for PT Re-Evaluation  03/17/18    Authorization Type  MCR    PT Start Time  6203    PT Stop Time  1420    PT Time Calculation (min)  43 min    Activity Tolerance  Patient tolerated treatment well;Patient limited by fatigue       Past Medical History:  Diagnosis Date  . Allergy   . Anxiety   . Asthma   . Attention deficit disorder without mention of hyperactivity   . Back pain   . Colitis 12/05/2010  . Colon polyps   . Depression   . Diverticulosis   . GERD (gastroesophageal reflux disease)   . H/O hiatal hernia   . Hypertension   . IBS (irritable bowel syndrome)   . Osteoarthrosis, unspecified whether generalized or localized, unspecified site   . Other and unspecified hyperlipidemia   . Renal cyst 10/2015   simple left  . Thyroid disease   . Ulcerative colitis   . Unspecified asthma(493.90)   . Unspecified essential hypertension    taken off meds since lost wt    Past Surgical History:  Procedure Laterality Date  . ABDOMINAL EXPOSURE N/A 04/22/2014   Procedure: ABDOMINAL EXPOSURE;  Surgeon: Angelia Mould, MD;  Location: [facility];  Service: Vascular;  Laterality: N/A;  . ANTERIOR LATERAL LUMBAR FUSION 4 LEVELS Left 04/22/2014   Procedure: Left Lumbar one/two, two/three, three/four, four,five. Anterior lateral lumbar interbody fusion**Stage 1**;  Surgeon: Erline Levine, MD;  Location: [facility];  Service: Neurosurgery;  Laterality: Left;  . ANTERIOR LUMBAR FUSION N/A 04/22/2014   Procedure: Lumbar five/-Sacral one. Anterior lumbar interbody  fusion with Dr. Scot Dock; Left Lumbar one/two, two/three/three/four, four/five. Anterior lateral lumbar interbody fusion**Stage 1**;  Surgeon: Erline Levine, MD;  Location: [facility];  Service: Neurosurgery;  Laterality: N/A;  . BACK SURGERY     fluid removed from between disks  . BREAST SURGERY     bil breast reduction   . CHOLECYSTECTOMY     Laparoscopic cholecystectomy  . COLONOSCOPY    . HIP FRACTURE SURGERY     left  11/01/2013  . LUMBAR PERCUTANEOUS PEDICLE SCREW 4 LEVEL N/A 04/25/2014   Procedure: **Stage 2** Percutaneous pedicle screw placement L1-5;  Surgeon: Erline Levine, MD;  Location: [facility];  Service: Neurosurgery;  Laterality: N/A;  **Stage 2** Percutaneous pedicle screw placement L1-5  . SHOULDER SURGERY     right shoulder 3x   . TOE SURGERY     right big toe  . TOTAL SHOULDER ARTHROPLASTY    . UPPER GASTROINTESTINAL ENDOSCOPY  09/28/2016   Dilation    There were no vitals filed for this visit.  Subjective Assessment - 03/03/18 1337    Subjective  Pt reports she isdoing well, Had treatment by MD last weak to numb/block the nerves.  Only has pain with prolonged sitting    How long can you sit comfortably?  5' still ( however last night was a little better )     Patient Stated Goals  get legs stronger,  walk without cane if it is possible using regular pattern    Currently in Pain?  No/denies         [facility] PT Assessment - 03/03/18 0001      Assessment   Medical Diagnosis  chronic LBP, Rt hip pain      AROM   Right Knee Extension  -7      Strength   Right Hip Flexion  4+/5    Right Hip Extension  4+/5    Right Hip ABduction  4-/5    Left Hip Flexion  4+/5    Left Hip Extension  4+/5    Left Hip ABduction  4/5                   [facility] Adult PT Treatment/Exercise - 03/03/18 0001      Exercises   Exercises  Lumbar      Lumbar Exercises: Aerobic   Nustep  L5x5' arms and legs      Lumbar Exercises: Standing   Other Standing Lumbar  Exercises  SLS  with toe taps FWD and BWD     Other Standing Lumbar Exercises  single leg kick backs with blue band      Lumbar Exercises: Seated   Sit to Stand  20 reps   with band around knees, attempt no UE   Other Seated Lumbar Exercises  10x5 sec quad sets each leg      Lumbar Exercises: Supine   Bridge  10 reps   figure 4 each side, VC for form   Bridge Limitations  10reps with legs straight , feet on bolster               PT Short Term Goals - 03/03/18 1418      PT SHORT TERM GOAL #1   Title  I with initial HEP     Status  Achieved      PT SHORT TERM GOAL #2   Title  report decrease pain in Rt hip =/> 25% with use of home TENs unit  to allow her to walk more    Status  Achieved   50% improved     PT SHORT TERM GOAL #3   Title  increase Rt knee extension to =/better than -5 degrees to decrease amount of trendelenburg with gait    Status  On-going   progressing       PT Long Term Goals - 03/03/18 1419      PT LONG TERM GOAL #1   Title  I with advanced HEP to include a pool program    Status  On-going      PT LONG TERM GOAL #2   Title  increase bilat hip/knee strength =/> 4-4+/5 to assist with mobility    Status  Achieved      PT LONG TERM GOAL #3   Title  improve FOTO =/< 52% limited    Status  On-going      PT LONG TERM GOAL #4   Title  improve bilat hip extension =/> 10 degrees to assist in normalizing gait and allow her to stand upright    Status  On-going      PT LONG TERM GOAL #5   Title  report =/> 50% improvement in abilty to perform her gardening     Status  On-going            Plan - 03/03/18 1555    Clinical Impression Statement  [name] is making  progress, met some of her goals and progressing to the others. Her pain is 50% reduced and her LE strength is getting better.  Her HEP was progressed today to include more challenging exercises.  She was fatigued after performing these. Manual work was held to see how her shots continue to  help. She may need more at the next visit.     Rehab Potential  Good    PT Frequency  2x / week    PT Duration  6 weeks    PT Treatment/Interventions  Iontophoresis 34m/ml Dexamethasone;Gait training;Stair training;Neuromuscular re-education;Dry needling;Manual techniques;Moist Heat;Ultrasound;Patient/family education;Taping;Therapeutic exercise;Cryotherapy;Electrical Stimulation    PT Next Visit Plan  slowly progress HEP, continue with manual work to gluts/low back and modalities PRN for pain    Consulted and Agree with Plan of Care  Patient       Patient will benefit from skilled therapeutic intervention in order to improve the following deficits and impairments:  Abnormal gait, Pain, Improper body mechanics, Increased muscle spasms, Decreased range of motion, Decreased strength, Difficulty walking  Visit Diagnosis: Muscle weakness (generalized)  Stiffness of right hip, not elsewhere classified  Stiffness of left hip, not elsewhere classified  Stiffness of right knee, not elsewhere classified  Abnormal posture  Other abnormalities of gait and mobility     Problem List Patient Active Problem List   Diagnosis Date Noted  . Preop exam for internal medicine 12/07/2017  . Chronic tonsillitis 12/05/2017  . Postlaminectomy syndrome, lumbar region 07/03/2017  . Chronic lumbar radiculopathy 07/03/2017  . Shingles outbreak 11/28/2016  . Acute conjunctivitis of right eye 11/28/2016  . Vision blurred 11/28/2016  . Bilateral hearing loss 11/28/2016  . Nasal sore 11/28/2016  . Acute pharyngitis 07/03/2016  . UTI (urinary tract infection) 11/30/2015  . Abdominal pain 09/14/2015  . Nausea without vomiting 07/31/2015  . Chronic right hip pain 07/02/2015  . Sciatica of right side 06/30/2015  . Concussion without loss of consciousness 05/23/2015  . Low back pain radiating to left lower extremity 12/22/2014  . Dizziness 08/11/2014  . DDD (degenerative disc disease), lumbosacral  04/21/2014  . Renal cyst 01/28/2014  . Microscopic hematuria 04/20/2013  . [place] 05/06/2012  . Spinal disease 03/29/2012  . Arthralgia 11/01/2011  . Osteoarthritis 08/17/2010  . GERD 08/17/2010  . Dyslipidemia 02/19/2008  . ALLERGIC RHINITIS CAUSE UNSPECIFIED 05/26/2007  . Hypothyroidism 03/21/2007  . Anxiety state 03/21/2007  . Depression 03/21/2007  . Attention deficit disorder of adult 03/21/2007  . Essential hypertension 03/21/2007  . Asthma 03/21/2007  . Irritable bowel syndrome 03/21/2007    SJeral PinchPT  03/03/2018, 3:57 PM  [facility] [address]Phone: [phone]  Fax:  [phone] Name: Tammy GUILEMRN: [id number] of Birth: 609-20-1947

## 2018-03-05 ENCOUNTER — Encounter: Payer: Self-pay | Admitting: Physical Therapy

## 2018-03-05 ENCOUNTER — Ambulatory Visit: Payer: Medicare Other | Admitting: Physical Therapy

## 2018-03-05 DIAGNOSIS — M6281 Muscle weakness (generalized): Secondary | ICD-10-CM | POA: Diagnosis not present

## 2018-03-05 DIAGNOSIS — M25651 Stiffness of right hip, not elsewhere classified: Secondary | ICD-10-CM | POA: Diagnosis not present

## 2018-03-05 DIAGNOSIS — M25661 Stiffness of right knee, not elsewhere classified: Secondary | ICD-10-CM | POA: Diagnosis not present

## 2018-03-05 DIAGNOSIS — R293 Abnormal posture: Secondary | ICD-10-CM

## 2018-03-05 DIAGNOSIS — R2689 Other abnormalities of gait and mobility: Secondary | ICD-10-CM | POA: Diagnosis not present

## 2018-03-05 DIAGNOSIS — M25652 Stiffness of left hip, not elsewhere classified: Secondary | ICD-10-CM | POA: Diagnosis not present

## 2018-03-06 ENCOUNTER — Ambulatory Visit: Payer: Medicare Other | Admitting: Physical Medicine & Rehabilitation

## 2018-03-06 ENCOUNTER — Encounter: Payer: Self-pay | Admitting: Physical Therapy

## 2018-03-06 NOTE — Therapy (Signed)
[place], [place], [address] Phone: [phone]   Fax:  [phone]  Physical Therapy Treatment  Patient Details  Name: [name] MRN: [id number] Date of Birth: 1945-07-29 Referring Provider: Dr Alysia Penna   Encounter Date: 03/05/2018  PT End of Session - 03/06/18 0930    Visit Number  7    Number of Visits  12    Date for PT Re-Evaluation  03/17/18    Authorization Type  MCR    PT Start Time  5009   Patient 7 minutes late for appointment    PT Stop Time  1415    PT Time Calculation (min)  38 min    Activity Tolerance  Patient tolerated treatment well    Behavior During Therapy  [facility] for tasks assessed/performed       Past Medical History:  Diagnosis Date  . Allergy   . Anxiety   . Asthma   . Attention deficit disorder without mention of hyperactivity   . Back pain   . Colitis 12/05/2010  . Colon polyps   . Depression   . Diverticulosis   . GERD (gastroesophageal reflux disease)   . H/O hiatal hernia   . Hypertension   . IBS (irritable bowel syndrome)   . Osteoarthrosis, unspecified whether generalized or localized, unspecified site   . Other and unspecified hyperlipidemia   . Renal cyst 10/2015   simple left  . Thyroid disease   . Ulcerative colitis   . Unspecified asthma(493.90)   . Unspecified essential hypertension    taken off meds since lost wt    Past Surgical History:  Procedure Laterality Date  . ABDOMINAL EXPOSURE N/A 04/22/2014   Procedure: ABDOMINAL EXPOSURE;  Surgeon: Angelia Mould, MD;  Location: [place] [facility];  Service: Vascular;  Laterality: N/A;  . ANTERIOR LATERAL LUMBAR FUSION 4 LEVELS Left 04/22/2014   Procedure: Left Lumbar one/two, two/three, three/four, four,five. Anterior lateral lumbar interbody fusion**Stage 1**;  Surgeon: Erline Levine, MD;  Location: [place] [facility];  Service: Neurosurgery;  Laterality: Left;  . ANTERIOR LUMBAR FUSION N/A  04/22/2014   Procedure: Lumbar five/-Sacral one. Anterior lumbar interbody fusion with Dr. Scot Dock; Left Lumbar one/two, two/three/three/four, four/five. Anterior lateral lumbar interbody fusion**Stage 1**;  Surgeon: Erline Levine, MD;  Location: [facility];  Service: Neurosurgery;  Laterality: N/A;  . BACK SURGERY     fluid removed from between disks  . BREAST SURGERY     bil breast reduction   . CHOLECYSTECTOMY     Laparoscopic cholecystectomy  . COLONOSCOPY    . HIP FRACTURE SURGERY     left  11/01/2013  . LUMBAR PERCUTANEOUS PEDICLE SCREW 4 LEVEL N/A 04/25/2014   Procedure: **Stage 2** Percutaneous pedicle screw placement L1-5;  Surgeon: Erline Levine, MD;  Location: [facility];  Service: Neurosurgery;  Laterality: N/A;  **Stage 2** Percutaneous pedicle screw placement L1-5  . SHOULDER SURGERY     right shoulder 3x   . TOE SURGERY     right big toe  . TOTAL SHOULDER ARTHROPLASTY    . UPPER GASTROINTESTINAL ENDOSCOPY  09/28/2016   Dilation    There were no vitals filed for this visit.  Subjective Assessment - 03/05/18 1340    Subjective  Patient reports her back is doing well. She is having right hip pain which she reports is radiating into her right hip and she is having pain into her left knee.     Pertinent History  Lt THA 2015, needs Rt THA - pt holding off on this, Rt knee pain - bone on bone    How long can you sit comfortably?  5' still ( however last night was a little better )     How long can you stand comfortably?  5'    How long can you walk comfortably?  ambulates with [place]    Diagnostic tests  MRI, xrays and lots of things    Patient Stated Goals  get legs stronger, walk without cane if it is possible using regular pattern    Currently in Pain?  Yes    Pain Score  7     Pain Location  Knee    Pain Orientation  Left    Aggravating Factors   standing and walking     Pain Relieving Factors  gentle motion     Effect of Pain on Daily Activities  difficulty walking      Multiple Pain Sites  Yes    Pain Score  5    Pain Location  Hip    Pain Orientation  Right;Left    Pain Descriptors / Indicators  Aching    Pain Type  Chronic pain    Pain Onset  More than a month ago    Pain Frequency  Constant    Aggravating Factors   standing and walking     Pain Relieving Factors  rest     Effect of Pain on Daily Activities  difficulty walking                        [facility] Adult PT Treatment/Exercise - 03/06/18 0001      Exercises   Exercises  Lumbar      Lumbar Exercises: Stretches   Other Lumbar Stretch Exercise  piriformis stretch 3x20 stretch; thomas stretch 3x20 sec hold lateral trunk rotation x10       Lumbar Exercises: Supine   Bridge  10 reps   figure 4 each side, VC for form   Other Supine Lumbar Exercises  supine clam 2x10 red     Other Supine Lumbar Exercises  supine march 2x10;       Moist Heat Therapy   Number Minutes Moist Heat  10 Minutes    Moist Heat Location  Lumbar Spine      Manual Therapy   Joint Mobilization  gentle inferior glides of the hip grade I and II on the right for pain     Soft tissue mobilization  soft tissue mobilization to the glut and IT band using a roller. Patient given a tennis ball for home.              PT Education - 03/05/18 1356    Education Details  updated strethcing; reviewed self soft tissue mobilization.     Person(s) Educated  Patient    Methods  Explanation;Demonstration;Tactile cues;Verbal cues    Comprehension  Verbalized understanding;Returned demonstration;Verbal cues required;Tactile cues required       PT Short Term Goals - 03/03/18 1418      PT SHORT TERM GOAL #1   Title  I with initial HEP     Status  Achieved      PT SHORT TERM GOAL #2   Title  report decrease pain in Rt hip =/> 25% with use of home TENs unit  to allow her to walk more    Status  Achieved   50% improved  PT SHORT TERM GOAL #3   Title  increase Rt knee extension to =/better than -5 degrees  to decrease amount of trendelenburg with gait    Status  On-going   progressing       PT Long Term Goals - 03/03/18 1419      PT LONG TERM GOAL #1   Title  I with advanced HEP to include a pool program    Status  On-going      PT LONG TERM GOAL #2   Title  increase bilat hip/knee strength =/> 4-4+/5 to assist with mobility    Status  Achieved      PT LONG TERM GOAL #3   Title  improve FOTO =/< 52% limited    Status  On-going      PT LONG TERM GOAL #4   Title  improve bilat hip extension =/> 10 degrees to assist in normalizing gait and allow her to stand upright    Status  On-going      PT LONG TERM GOAL #5   Title  report =/> 50% improvement in abilty to perform her gardening     Status  On-going            Plan - 03/06/18 0935    Clinical Impression Statement  Therapy reviewed stretching for current pain in her right hip and left knee. She reported feeling less pain in her knee after thomas stretch . She ewas given the stretch for her HEP. Therapy alos gave her a glut stretch and worked on IT band and glut roll out. She reported decreased painwith treatment. Therpay will progress her back into exercises next visit if tolerated.     History and Personal Factors relevant to plan of care:  r    Clinical Presentation  Evolving    Clinical Decision Making  Moderate    Rehab Potential  Good    PT Frequency  2x / week    PT Duration  6 weeks    PT Treatment/Interventions  Iontophoresis 4mg /ml Dexamethasone;Gait training;Stair training;Neuromuscular re-education;Dry needling;Manual techniques;Moist Heat;Ultrasound;Patient/family education;Taping;Therapeutic exercise;Cryotherapy;Electrical Stimulation    PT Next Visit Plan  slowly progress HEP, continue with manual work to gluts/low back and modalities PRN for pain    Consulted and Agree with Plan of Care  Patient       Patient will benefit from skilled therapeutic intervention in order to improve the following deficits and  impairments:  Abnormal gait, Pain, Improper body mechanics, Increased muscle spasms, Decreased range of motion, Decreased strength, Difficulty walking  Visit Diagnosis: Muscle weakness (generalized)  Stiffness of right hip, not elsewhere classified  Stiffness of left hip, not elsewhere classified  Stiffness of right knee, not elsewhere classified  Abnormal posture  Other abnormalities of gait and mobility     Problem List Patient Active Problem List   Diagnosis Date Noted  . Preop exam for internal medicine 12/07/2017  . Chronic tonsillitis 12/05/2017  . Postlaminectomy syndrome, lumbar region 07/03/2017  . Chronic lumbar radiculopathy 07/03/2017  . Shingles outbreak 11/28/2016  . Acute conjunctivitis of right eye 11/28/2016  . Vision blurred 11/28/2016  . Bilateral hearing loss 11/28/2016  . Nasal sore 11/28/2016  . Acute pharyngitis 07/03/2016  . UTI (urinary tract infection) 11/30/2015  . Abdominal pain 09/14/2015  . Nausea without vomiting 07/31/2015  . Chronic right hip pain 07/02/2015  . Sciatica of right side 06/30/2015  . Concussion without loss of consciousness 05/23/2015  . Low back pain radiating to left lower  extremity 12/22/2014  . Dizziness 08/11/2014  . DDD (degenerative disc disease), lumbosacral 04/21/2014  . Renal cyst 01/28/2014  . Microscopic hematuria 04/20/2013  . Ulcerative colitis ([place]) 05/06/2012  . Spinal disease 03/29/2012  . Arthralgia 11/01/2011  . Osteoarthritis 08/17/2010  . GERD 08/17/2010  . Dyslipidemia 02/19/2008  . ALLERGIC RHINITIS CAUSE UNSPECIFIED 05/26/2007  . Hypothyroidism 03/21/2007  . Anxiety state 03/21/2007  . Depression 03/21/2007  . Attention deficit disorder of adult 03/21/2007  . Essential hypertension 03/21/2007  . Asthma 03/21/2007  . Irritable bowel syndrome 03/21/2007    Carney Living PT DPT  03/06/2018, 9:46 AM  [facility] [address] [place], [place], [address] Phone: [phone]   Fax:  [phone]  Name: [name] MRN: [id number] Date of Birth: 1945/11/28

## 2018-03-10 ENCOUNTER — Ambulatory Visit: Payer: Medicare Other | Admitting: Physical Therapy

## 2018-03-13 ENCOUNTER — Encounter

## 2018-03-17 ENCOUNTER — Telehealth: Payer: Self-pay | Admitting: Physical Therapy

## 2018-03-17 ENCOUNTER — Ambulatory Visit: Payer: Medicare Other | Admitting: Physical Therapy

## 2018-03-17 NOTE — Telephone Encounter (Signed)
Called pt regarding missed appointment, she apologized, had forgotten.    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  She will be here for Thursday's appointment

## 2018-03-19 ENCOUNTER — Ambulatory Visit: Payer: Medicare Other | Admitting: Physical Therapy

## 2018-03-19 ENCOUNTER — Other Ambulatory Visit: Payer: Self-pay

## 2018-03-19 DIAGNOSIS — M6281 Muscle weakness (generalized): Secondary | ICD-10-CM

## 2018-03-19 DIAGNOSIS — R293 Abnormal posture: Secondary | ICD-10-CM | POA: Diagnosis not present

## 2018-03-19 DIAGNOSIS — M25661 Stiffness of right knee, not elsewhere classified: Secondary | ICD-10-CM | POA: Diagnosis not present

## 2018-03-19 DIAGNOSIS — R2689 Other abnormalities of gait and mobility: Secondary | ICD-10-CM | POA: Diagnosis not present

## 2018-03-19 DIAGNOSIS — M25652 Stiffness of left hip, not elsewhere classified: Secondary | ICD-10-CM | POA: Diagnosis not present

## 2018-03-19 DIAGNOSIS — M25651 Stiffness of right hip, not elsewhere classified: Secondary | ICD-10-CM | POA: Diagnosis not present

## 2018-03-19 NOTE — Therapy (Addendum)
[place], [place], [address] Phone: [phone]   Fax:  [phone]  Physical Therapy Treatment  Progress Note Reporting Period  to 02/03/18 through 03/19/18  See note below for Objective Data and Assessment of Progress/Goals.   Jeral Pinch, PT 03/26/18 9:49 AM       Patient Details  Name: [name] MRN: [id number] Date of Birth: Sep 03, 1945 Referring Provider (PT): Dr Alysia Penna   Encounter Date: 03/19/2018  PT End of Session - 03/19/18 1336    Visit Number  8    Number of Visits  16    Date for PT Re-Evaluation  04/30/18    Authorization Type  MCR, kx at visit 15    PT Start Time  1336    PT Stop Time  1429    PT Time Calculation (min)  53 min    Activity Tolerance  Patient limited by pain    Behavior During Therapy  [facility] for tasks assessed/performed       Past Medical History:  Diagnosis Date  . Allergy   . Anxiety   . Asthma   . Attention deficit disorder without mention of hyperactivity   . Back pain   . Colitis 12/05/2010  . Colon polyps   . Depression   . Diverticulosis   . GERD (gastroesophageal reflux disease)   . H/O hiatal hernia   . Hypertension   . IBS (irritable bowel syndrome)   . Osteoarthrosis, unspecified whether generalized or localized, unspecified site   . Other and unspecified hyperlipidemia   . Renal cyst 10/2015   simple left  . Thyroid disease   . Ulcerative colitis   . Unspecified asthma(493.90)   . Unspecified essential hypertension    taken off meds since lost wt    Past Surgical History:  Procedure Laterality Date  . ABDOMINAL EXPOSURE N/A 04/22/2014   Procedure: ABDOMINAL EXPOSURE;  Surgeon: Angelia Mould, MD;  Location: [facility] [facility];  Service: Vascular;  Laterality: N/A;  . ANTERIOR LATERAL LUMBAR FUSION 4 LEVELS Left 04/22/2014   Procedure: Left Lumbar one/two, two/three, three/four, four,five. Anterior lateral lumbar  interbody fusion**Stage 1**;  Surgeon: Erline Levine, MD;  Location: [facility];  Service: Neurosurgery;  Laterality: Left;  . ANTERIOR LUMBAR FUSION N/A 04/22/2014   Procedure: Lumbar five/-Sacral one. Anterior lumbar interbody fusion with Dr. Scot Dock; Left Lumbar one/two, two/three/three/four, four/five. Anterior lateral lumbar interbody fusion**Stage 1**;  Surgeon: Erline Levine, MD;  Location: [facility];  Service: Neurosurgery;  Laterality: N/A;  . BACK SURGERY     fluid removed from between disks  . BREAST SURGERY     bil breast reduction   . CHOLECYSTECTOMY     Laparoscopic cholecystectomy  . COLONOSCOPY    . HIP FRACTURE SURGERY     left  11/01/2013  . LUMBAR PERCUTANEOUS PEDICLE SCREW 4 LEVEL N/A 04/25/2014   Procedure: **Stage 2** Percutaneous pedicle screw placement L1-5;  Surgeon: Erline Levine, MD;  Location: [facility];  Service: Neurosurgery;  Laterality: N/A;  **Stage 2** Percutaneous pedicle screw placement L1-5  . SHOULDER SURGERY     right shoulder 3x   . TOE SURGERY     right big toe  . TOTAL SHOULDER ARTHROPLASTY    . UPPER GASTROINTESTINAL ENDOSCOPY  09/28/2016   Dilation    There were no vitals filed for this visit.  Subjective Assessment - 03/19/18 1336    Subjective  Pt reports she thinks she pulled her  Rt groin with moving over the last week    Pertinent History  Lt THA 2015, needs Rt THA - pt holding off on this, Rt knee pain - bone on bone    Patient Stated Goals  get legs stronger, walk without cane if it is possible using regular pattern    Currently in Pain?  Yes    Pain Score  6     Pain Location  Groin    Pain Orientation  Right    Pain Descriptors / Indicators  Sharp    Pain Type  Chronic pain    Pain Onset  More than a month ago    Pain Frequency  Constant    Aggravating Factors   walking    Pain Relieving Factors  rest         [facility] PT Assessment - 03/19/18 0001      Assessment   Medical Diagnosis  chronic LBP, Rt hip pain     Referring Provider (PT)  Dr Alysia Penna    Onset Date/Surgical Date  02/03/14    Hand Dominance  Right      Observation/Other Assessments   Focus on Therapeutic Outcomes (FOTO)   44% limited      AROM   Right Hip Extension  -10    Left Hip Extension  5    Right/Left Knee  Right    Right Knee Extension  -8      Strength   Right/Left Hip  Left;Right    Right Hip Flexion  5/5    Right Hip Extension  4+/5    Right Hip ABduction  4/5    Left Hip Flexion  5/5    Left Hip Extension  4+/5    Left Hip ABduction  --   5-/5   Right/Left Knee  --   Rt 5/5,Lt 5-/5                  [facility] Adult PT Treatment/Exercise - 03/19/18 0001      Exercises   Exercises  Lumbar      Lumbar Exercises: Stretches   Other Lumbar Stretch Exercise  butterfly stretch for Rt thigh       Lumbar Exercises: Aerobic   Nustep  L5 LE only x 5'      Lumbar Exercises: Supine   Other Supine Lumbar Exercises  10 reps leg lengtheners      Modalities   Modalities  Moist Heat;Cryotherapy      Manual Therapy   Manual Therapy  Soft tissue mobilization;Joint mobilization    Soft tissue mobilization  STM to Rt inner thigh     Manual Traction  Rt LE with oscillations and stretch into slight hip abduction       Trigger Point Dry Needling - 03/19/18 1409    Consent Given?  Yes    Education Handout Provided  No    Muscles Treated Lower Body  Adductor longus/brevius/maximus    Adductor Response  Palpable increased muscle length;Twitch response elicited   Rt hip            PT Short Term Goals - 03/19/18 1352      PT SHORT TERM GOAL #1   Title  I with initial HEP     Status  Achieved      PT SHORT TERM GOAL #2   Title  report decrease pain in Rt hip =/> 25% with use of home TENs unit  to allow her to walk more  Status  Achieved      PT SHORT TERM GOAL #3   Title  increase Rt knee extension to =/better than -5 degrees to decrease amount of trendelenburg with gait    Status   On-going        PT Long Term Goals - 03/19/18 1353      PT LONG TERM GOAL #1   Title  I with advanced HEP to include a pool program    Time  6    Period  Weeks    Status  On-going    Target Date  04/30/18      PT LONG TERM GOAL #2   Title  increase bilat hip/knee strength =/> 4-4+/5 to assist with mobility    Time  6    Period  Weeks    Status  Partially Met    Target Date  04/30/18      PT LONG TERM GOAL #3   Title  improve FOTO =/< 52% limited    Baseline  44% limited    Status  Achieved      PT LONG TERM GOAL #4   Title  improve bilat hip extension =/> 10 degrees to assist in normalizing gait and allow her to stand upright    Time  6    Period  Weeks    Status  On-going    Target Date  04/30/18      PT LONG TERM GOAL #5   Title  report =/> 50% improvement in abilty to perform her gardening     Time  6    Period  Weeks    Status  On-going    Target Date  04/30/18            Plan - 03/19/18 1350    Clinical Impression Statement  [name] has had a big set back in pain levels over the last week due to having to move into a new house and performing a lot of lifting bending.  Even with her reports of increased difficulty with mobility and pain she did demo improved hip and knee strength along with improved hip extension.  Her knee extension is still better than her first visit and very close to what it was 2 wks ago.  She would benefit from continued tx to address her defecits, settle her pain back down and restore more normal movement patterns with mobility to decrease risk of reinjury.     Rehab Potential  Good    PT Frequency  2x / week    PT Duration  6 weeks    PT Treatment/Interventions  Iontophoresis 33m/ml Dexamethasone;Gait training;Stair training;Neuromuscular re-education;Dry needling;Manual techniques;Moist Heat;Ultrasound;Patient/family education;Taping;Therapeutic exercise;Cryotherapy;Electrical Stimulation    PT Next Visit Plan  slowly progress HEP,  continue with manual work to Rt hip adductors, gluts/low back and modalities PRN for pain    Consulted and Agree with Plan of Care  Patient       Patient will benefit from skilled therapeutic intervention in order to improve the following deficits and impairments:  Abnormal gait, Pain, Improper body mechanics, Increased muscle spasms, Decreased range of motion, Decreased strength, Difficulty walking  Visit Diagnosis: Muscle weakness (generalized) - Plan: PT plan of care cert/re-cert  Stiffness of right hip, not elsewhere classified - Plan: PT plan of care cert/re-cert  Stiffness of left hip, not elsewhere classified - Plan: PT plan of care cert/re-cert  Stiffness of right knee, not elsewhere classified - Plan: PT plan of care cert/re-cert  Abnormal  posture - Plan: PT plan of care cert/re-cert  Other abnormalities of gait and mobility - Plan: PT plan of care cert/re-cert     Problem List Patient Active Problem List   Diagnosis Date Noted  . Preop exam for internal medicine 12/07/2017  . Chronic tonsillitis 12/05/2017  . Postlaminectomy syndrome, lumbar region 07/03/2017  . Chronic lumbar radiculopathy 07/03/2017  . Shingles outbreak 11/28/2016  . Acute conjunctivitis of right eye 11/28/2016  . Vision blurred 11/28/2016  . Bilateral hearing loss 11/28/2016  . Nasal sore 11/28/2016  . Acute pharyngitis 07/03/2016  . UTI (urinary tract infection) 11/30/2015  . Abdominal pain 09/14/2015  . Nausea without vomiting 07/31/2015  . Chronic right hip pain 07/02/2015  . Sciatica of right side 06/30/2015  . Concussion without loss of consciousness 05/23/2015  . Low back pain radiating to left lower extremity 12/22/2014  . Dizziness 08/11/2014  . DDD (degenerative disc disease), lumbosacral 04/21/2014  . Renal cyst 01/28/2014  . Microscopic hematuria 04/20/2013  . Ulcerative colitis ([place]) 05/06/2012  . Spinal disease 03/29/2012  . Arthralgia 11/01/2011  . Osteoarthritis 08/17/2010   . GERD 08/17/2010  . Dyslipidemia 02/19/2008  . ALLERGIC RHINITIS CAUSE UNSPECIFIED 05/26/2007  . Hypothyroidism 03/21/2007  . Anxiety state 03/21/2007  . Depression 03/21/2007  . Attention deficit disorder of adult 03/21/2007  . Essential hypertension 03/21/2007  . Asthma 03/21/2007  . Irritable bowel syndrome 03/21/2007    Jeral Pinch PT  03/19/2018, 2:23 PM  [facility] [address] [place], [place], [address] Phone: [phone]   Fax:  [phone]  Name: [name] MRN: [id number] Date of Birth: 1945/12/16

## 2018-03-24 ENCOUNTER — Ambulatory Visit: Payer: Medicare Other | Admitting: Physical Therapy

## 2018-03-25 ENCOUNTER — Other Ambulatory Visit: Payer: Self-pay

## 2018-03-25 ENCOUNTER — Ambulatory Visit: Payer: Medicare Other | Attending: Physical Medicine & Rehabilitation | Admitting: Physical Therapy

## 2018-03-25 DIAGNOSIS — M6281 Muscle weakness (generalized): Secondary | ICD-10-CM | POA: Diagnosis not present

## 2018-03-25 DIAGNOSIS — M25652 Stiffness of left hip, not elsewhere classified: Secondary | ICD-10-CM | POA: Diagnosis not present

## 2018-03-25 DIAGNOSIS — R293 Abnormal posture: Secondary | ICD-10-CM

## 2018-03-25 DIAGNOSIS — M25661 Stiffness of right knee, not elsewhere classified: Secondary | ICD-10-CM

## 2018-03-25 DIAGNOSIS — M25651 Stiffness of right hip, not elsewhere classified: Secondary | ICD-10-CM

## 2018-03-25 DIAGNOSIS — R2689 Other abnormalities of gait and mobility: Secondary | ICD-10-CM | POA: Diagnosis not present

## 2018-03-25 NOTE — Therapy (Signed)
[place], [place], [address] Phone: [phone]   Fax:  [phone]  Physical Therapy Treatment  Patient Details  Name: [name] MRN: [id number] Date of Birth: September 14, 1945 Referring Provider (PT): Dr Alysia Penna   Encounter Date: 03/25/2018  PT End of Session - 03/25/18 1122    Visit Number  9    Number of Visits  16    Date for PT Re-Evaluation  04/30/18    Authorization Type  MCR, kx at visit 15    Activity Tolerance  Patient tolerated treatment well    Behavior During Therapy  [facility] for tasks assessed/performed       Past Medical History:  Diagnosis Date  . Allergy   . Anxiety   . Asthma   . Attention deficit disorder without mention of hyperactivity   . Back pain   . Colitis 12/05/2010  . Colon polyps   . Depression   . Diverticulosis   . GERD (gastroesophageal reflux disease)   . H/O hiatal hernia   . Hypertension   . IBS (irritable bowel syndrome)   . Osteoarthrosis, unspecified whether generalized or localized, unspecified site   . Other and unspecified hyperlipidemia   . Renal cyst 10/2015   simple left  . Thyroid disease   . Ulcerative colitis   . Unspecified asthma(493.90)   . Unspecified essential hypertension    taken off meds since lost wt    Past Surgical History:  Procedure Laterality Date  . ABDOMINAL EXPOSURE N/A 04/22/2014   Procedure: ABDOMINAL EXPOSURE;  Surgeon: Angelia Mould, MD;  Location: [place] [facility];  Service: Vascular;  Laterality: N/A;  . ANTERIOR LATERAL LUMBAR FUSION 4 LEVELS Left 04/22/2014   Procedure: Left Lumbar one/two, two/three, three/four, four,five. Anterior lateral lumbar interbody fusion**Stage 1**;  Surgeon: Erline Levine, MD;  Location: [place] [facility];  Service: Neurosurgery;  Laterality: Left;  . ANTERIOR LUMBAR FUSION N/A 04/22/2014   Procedure: Lumbar five/-Sacral one. Anterior lumbar interbody fusion with Dr. Scot Dock; Left  Lumbar one/two, two/three/three/four, four/five. Anterior lateral lumbar interbody fusion**Stage 1**;  Surgeon: Erline Levine, MD;  Location: [place] [facility];  Service: Neurosurgery;  Laterality: N/A;  . BACK SURGERY     fluid removed from between disks  . BREAST SURGERY     bil breast reduction   . CHOLECYSTECTOMY     Laparoscopic cholecystectomy  . COLONOSCOPY    . HIP FRACTURE SURGERY     left  11/01/2013  . LUMBAR PERCUTANEOUS PEDICLE SCREW 4 LEVEL N/A 04/25/2014   Procedure: **Stage 2** Percutaneous pedicle screw placement L1-5;  Surgeon: Erline Levine, MD;  Location: [place] [facility];  Service: Neurosurgery;  Laterality: N/A;  **Stage 2** Percutaneous pedicle screw placement L1-5  . SHOULDER SURGERY     right shoulder 3x   . TOE SURGERY     right big toe  . TOTAL SHOULDER ARTHROPLASTY    . UPPER GASTROINTESTINAL ENDOSCOPY  09/28/2016   Dilation    There were no vitals filed for this visit.  Subjective Assessment - 03/25/18 1059    Subjective  Right groin/adductor muscles feeling improved since last session but noting some general increased soreness in hip, back, knee with prolonged car travel to [place] last weekend.    Pain Score  5     Pain Location  Hip   Right hip, knee, and lumbar region   Pain Orientation  Right    Pain Descriptors / Indicators  Aching  [facility] Adult PT Treatment/Exercise - 03/25/18 0001      Exercises   Exercises  Knee/Hip      Lumbar Exercises: Stretches   ITB Stretch  Right;3 reps;30 seconds      Lumbar Exercises: Aerobic   Nustep  L5 LE only x 5 min      Lumbar Exercises: Supine   Bridge  20 reps    Other Supine Lumbar Exercises  supine clam 2x10 with red band    Other Supine Lumbar Exercises  supine march 2x10;       Knee/Hip Exercises: Stretches   Piriformis Stretch  Right;3 reps;30 seconds      Knee/Hip Exercises: Supine   Hip Adduction Isometric  2 sets;10 reps      Modalities   Modalities  Moist  Heat      Moist Heat Therapy   Number Minutes Moist Heat  10 Minutes    Moist Heat Location  Hip;Lumbar Spine   in left sidelying     Manual Therapy   Manual Therapy  Soft tissue mobilization    Soft tissue mobilization  STM to right glut, proximal IT band including foam roller use       Trigger Point Dry Needling - 03/25/18 1121    Consent Given?  Yes    Education Handout Provided  No    Muscles Treated Lower Body  Gluteus maximus    Gluteus Maximus Response  Palpable increased muscle length           PT Education - 03/25/18 1122    Education Details  Hip anatomy with location muscular pain/gluteal region, potential of hip OA referral as contributing to groin symptoms    Person(s) Educated  Patient    Methods  Explanation    Comprehension  Verbalized understanding       PT Short Term Goals - 03/19/18 1352      PT SHORT TERM GOAL #1   Title  I with initial HEP     Status  Achieved      PT SHORT TERM GOAL #2   Title  report decrease pain in Rt hip =/> 25% with use of home TENs unit  to allow her to walk more    Status  Achieved      PT SHORT TERM GOAL #3   Title  increase Rt knee extension to =/better than -5 degrees to decrease amount of trendelenburg with gait    Status  On-going        PT Long Term Goals - 03/19/18 1353      PT LONG TERM GOAL #1   Title  I with advanced HEP to include a pool program    Time  6    Period  Weeks    Status  On-going    Target Date  04/30/18      PT LONG TERM GOAL #2   Title  increase bilat hip/knee strength =/> 4-4+/5 to assist with mobility    Time  6    Period  Weeks    Status  Partially Met    Target Date  04/30/18      PT LONG TERM GOAL #3   Title  improve FOTO =/< 52% limited    Baseline  44% limited    Status  Achieved      PT LONG TERM GOAL #4   Title  improve bilat hip extension =/> 10 degrees to assist in normalizing gait and allow her to stand upright  Time  6    Period  Weeks    Status  On-going     Target Date  04/30/18      PT LONG TERM GOAL #5   Title  report =/> 50% improvement in abilty to perform her gardening     Time  6    Period  Weeks    Status  On-going    Target Date  04/30/18            Plan - 03/25/18 1123    Clinical Impression Statement  Adductor region muscular pain improved from status last week. Primary pain today right lower/lateral gluteus maximus included STM as well as brief dry needling to address. Though muscular pain in adductor region improved suspect continued local groin pain is associated with hip OA. Pt. would benefit from continued PT for further progress to address current functional limitations for mobility.    Rehab Potential  Good    PT Frequency  2x / week    PT Duration  6 weeks    PT Treatment/Interventions  Iontophoresis 38m/ml Dexamethasone;Gait training;Stair training;Neuromuscular re-education;Dry needling;Manual techniques;Moist Heat;Ultrasound;Patient/family education;Taping;Therapeutic exercise;Cryotherapy;Electrical Stimulation    PT Next Visit Plan  Continue manual therapy and exercise progression as tolerated. Further dry needling and modalities prn    Consulted and Agree with Plan of Care  Patient       Patient will benefit from skilled therapeutic intervention in order to improve the following deficits and impairments:  Abnormal gait, Pain, Improper body mechanics, Increased muscle spasms, Decreased range of motion, Decreased strength, Difficulty walking  Visit Diagnosis: Muscle weakness (generalized)  Stiffness of right hip, not elsewhere classified  Stiffness of left hip, not elsewhere classified  Stiffness of right knee, not elsewhere classified  Abnormal posture  Other abnormalities of gait and mobility     Problem List Patient Active Problem List   Diagnosis Date Noted  . Preop exam for internal medicine 12/07/2017  . Chronic tonsillitis 12/05/2017  . Postlaminectomy syndrome, lumbar region 07/03/2017   . Chronic lumbar radiculopathy 07/03/2017  . Shingles outbreak 11/28/2016  . Acute conjunctivitis of right eye 11/28/2016  . Vision blurred 11/28/2016  . Bilateral hearing loss 11/28/2016  . Nasal sore 11/28/2016  . Acute pharyngitis 07/03/2016  . UTI (urinary tract infection) 11/30/2015  . Abdominal pain 09/14/2015  . Nausea without vomiting 07/31/2015  . Chronic right hip pain 07/02/2015  . Sciatica of right side 06/30/2015  . Concussion without loss of consciousness 05/23/2015  . Low back pain radiating to left lower extremity 12/22/2014  . Dizziness 08/11/2014  . DDD (degenerative disc disease), lumbosacral 04/21/2014  . Renal cyst 01/28/2014  . Microscopic hematuria 04/20/2013  . Ulcerative colitis ([place] 05/06/2012  . Spinal disease 03/29/2012  . Arthralgia 11/01/2011  . Osteoarthritis 08/17/2010  . GERD 08/17/2010  . Dyslipidemia 02/19/2008  . ALLERGIC RHINITIS CAUSE UNSPECIFIED 05/26/2007  . Hypothyroidism 03/21/2007  . Anxiety state 03/21/2007  . Depression 03/21/2007  . Attention deficit disorder of adult 03/21/2007  . Essential hypertension 03/21/2007  . Asthma 03/21/2007  . Irritable bowel syndrome 03/21/2007    CJudeth CornfieldZOCH, PT, DPT 03/25/2018, 11:32 AM  [facility] [address]Phone: [phone]  Fax:  [phone] Name: SAMARYA KUEHLMRN: [id number] of Birth: [birth year]

## 2018-03-26 ENCOUNTER — Ambulatory Visit: Payer: Medicare Other | Admitting: Physical Therapy

## 2018-03-26 ENCOUNTER — Encounter: Payer: Self-pay | Admitting: Physical Therapy

## 2018-03-26 DIAGNOSIS — R293 Abnormal posture: Secondary | ICD-10-CM

## 2018-03-26 DIAGNOSIS — M25651 Stiffness of right hip, not elsewhere classified: Secondary | ICD-10-CM

## 2018-03-26 DIAGNOSIS — M25652 Stiffness of left hip, not elsewhere classified: Secondary | ICD-10-CM

## 2018-03-26 DIAGNOSIS — M25661 Stiffness of right knee, not elsewhere classified: Secondary | ICD-10-CM

## 2018-03-26 DIAGNOSIS — R2689 Other abnormalities of gait and mobility: Secondary | ICD-10-CM | POA: Diagnosis not present

## 2018-03-26 DIAGNOSIS — M6281 Muscle weakness (generalized): Secondary | ICD-10-CM

## 2018-03-26 NOTE — Therapy (Signed)
[place], [place], [address] Phone: [phone]   Fax:  [phone]  Physical Therapy Treatment  Patient Details  Name: [name] MRN: [id number] Date of Birth: 05/21/1946 Referring Provider (PT): Dr Alysia Penna   Encounter Date: 03/26/2018  PT End of Session - 03/26/18 1340    Visit Number  10    Number of Visits  16    Date for PT Re-Evaluation  04/30/18    Authorization Type  MCR, kx at visit 15    PT Start Time  1340    PT Stop Time  1437    PT Time Calculation (min)  57 min    Activity Tolerance  Patient limited by pain       Past Medical History:  Diagnosis Date  . Allergy   . Anxiety   . Asthma   . Attention deficit disorder without mention of hyperactivity   . Back pain   . Colitis 12/05/2010  . Colon polyps   . Depression   . Diverticulosis   . GERD (gastroesophageal reflux disease)   . H/O hiatal hernia   . Hypertension   . IBS (irritable bowel syndrome)   . Osteoarthrosis, unspecified whether generalized or localized, unspecified site   . Other and unspecified hyperlipidemia   . Renal cyst 10/2015   simple left  . Thyroid disease   . Ulcerative colitis   . Unspecified asthma(493.90)   . Unspecified essential hypertension    taken off meds since lost wt    Past Surgical History:  Procedure Laterality Date  . ABDOMINAL EXPOSURE N/A 04/22/2014   Procedure: ABDOMINAL EXPOSURE;  Surgeon: Angelia Mould, MD;  Location: [place] [facility];  Service: Vascular;  Laterality: N/A;  . ANTERIOR LATERAL LUMBAR FUSION 4 LEVELS Left 04/22/2014   Procedure: Left Lumbar one/two, two/three, three/four, four,five. Anterior lateral lumbar interbody fusion**Stage 1**;  Surgeon: Erline Levine, MD;  Location: [place] [facility];  Service: Neurosurgery;  Laterality: Left;  . ANTERIOR LUMBAR FUSION N/A 04/22/2014   Procedure: Lumbar five/-Sacral one. Anterior lumbar interbody fusion with Dr.  Scot Dock; Left Lumbar one/two, two/three/three/four, four/five. Anterior lateral lumbar interbody fusion**Stage 1**;  Surgeon: Erline Levine, MD;  Location: [place] [facility];  Service: Neurosurgery;  Laterality: N/A;  . BACK SURGERY     fluid removed from between disks  . BREAST SURGERY     bil breast reduction   . CHOLECYSTECTOMY     Laparoscopic cholecystectomy  . COLONOSCOPY    . HIP FRACTURE SURGERY     left  11/01/2013  . LUMBAR PERCUTANEOUS PEDICLE SCREW 4 LEVEL N/A 04/25/2014   Procedure: **Stage 2** Percutaneous pedicle screw placement L1-5;  Surgeon: Erline Levine, MD;  Location: [facility] [facility];  Service: Neurosurgery;  Laterality: N/A;  **Stage 2** Percutaneous pedicle screw placement L1-5  . SHOULDER SURGERY     right shoulder 3x   . TOE SURGERY     right big toe  . TOTAL SHOULDER ARTHROPLASTY    . UPPER GASTROINTESTINAL ENDOSCOPY  09/28/2016   Dilation    There were no vitals filed for this visit.  Subjective Assessment - 03/26/18 1341    Subjective  [name] states she was feeling really good until today.  She is having a lot of Rt groin pain- feels like it is from stress with her husband.     Patient Stated Goals  get legs stronger, walk without cane if it is possible using regular pattern  Currently in Pain?  Yes    Pain Score  5     Pain Location  Groin    Pain Orientation  Right    Pain Descriptors / Indicators  Sharp;Aching    Pain Type  Chronic pain                       [facility] Adult PT Treatment/Exercise - 03/26/18 0001      Exercises   Exercises  Knee/Hip      Knee/Hip Exercises: Stretches   Other Knee/Hip Stretches  butterfly stretches      Modalities   Modalities  Moist Heat;Electrical Stimulation      Moist Heat Therapy   Number Minutes Moist Heat  15 Minutes    Moist Heat Location  Hip   and groin Rt      Electrical Stimulation   Electrical Stimulation Location  Rt inner thigh/hip, Rt gluts    Electrical Stimulation Action  premod     Electrical Stimulation Parameters  to tolerance    Electrical Stimulation Goals  Tone;Pain      Manual Therapy   Manual Therapy  Soft tissue mobilization;Manual Traction    Soft tissue mobilization  STM to Rt hip adductors with TPR attempted STM to Rt gluts - pt with a lot of guarding and unable to tolerate it today.     Manual Traction  Rt LE with oscillations and stretch into slight hip abduction       Trigger Point Dry Needling - 03/26/18 1351    Consent Given?  Yes    Education Handout Provided  No    Adductor Response  Palpable increased muscle length;Twitch response elicited   Rt thigh with stim            PT Short Term Goals - 03/19/18 1352      PT SHORT TERM GOAL #1   Title  I with initial HEP     Status  Achieved      PT SHORT TERM GOAL #2   Title  report decrease pain in Rt hip =/> 25% with use of home TENs unit  to allow her to walk more    Status  Achieved      PT SHORT TERM GOAL #3   Title  increase Rt knee extension to =/better than -5 degrees to decrease amount of trendelenburg with gait    Status  On-going        PT Long Term Goals - 03/19/18 1353      PT LONG TERM GOAL #1   Title  I with advanced HEP to include a pool program    Time  6    Period  Weeks    Status  On-going    Target Date  04/30/18      PT LONG TERM GOAL #2   Title  increase bilat hip/knee strength =/> 4-4+/5 to assist with mobility    Time  6    Period  Weeks    Status  Partially Met    Target Date  04/30/18      PT LONG TERM GOAL #3   Title  improve FOTO =/< 52% limited    Baseline  44% limited    Status  Achieved      PT LONG TERM GOAL #4   Title  improve bilat hip extension =/> 10 degrees to assist in normalizing gait and allow her to stand upright    Time  6  Period  Weeks    Status  On-going    Target Date  04/30/18      PT LONG TERM GOAL #5   Title  report =/> 50% improvement in abilty to perform her gardening     Time  6    Period  Weeks    Status   On-going    Target Date  04/30/18            Plan - 03/26/18 1508    Clinical Impression Statement  Manasi presented today with increased reports of pain. She was walking with improved gait pattern, less Rt LE valgus.  She was very tight into her adductors, responded well to manual work  and heat/stim.  She reported significant improvement afterwards.  She had doned some gardening and thought her pain was either from that or stress.  She is approaching hospice for her husband and is looking at ways to help reduce her streass.     Rehab Potential  Good    PT Frequency  2x / week    PT Duration  6 weeks    PT Treatment/Interventions  Iontophoresis 4mg/ml Dexamethasone;Gait training;Stair training;Neuromuscular re-education;Dry needling;Manual techniques;Moist Heat;Ultrasound;Patient/family education;Taping;Therapeutic exercise;Cryotherapy;Electrical Stimulation    PT Next Visit Plan  Continue manual therapy and exercise progression as tolerated. Further dry needling and modalities prn    Consulted and Agree with Plan of Care  Patient       Patient will benefit from skilled therapeutic intervention in order to improve the following deficits and impairments:  Abnormal gait, Pain, Improper body mechanics, Increased muscle spasms, Decreased range of motion, Decreased strength, Difficulty walking  Visit Diagnosis: Muscle weakness (generalized)  Stiffness of right hip, not elsewhere classified  Stiffness of left hip, not elsewhere classified  Stiffness of right knee, not elsewhere classified  Abnormal posture  Other abnormalities of gait and mobility     Problem List Patient Active Problem List   Diagnosis Date Noted  . Preop exam for internal medicine 12/07/2017  . Chronic tonsillitis 12/05/2017  . Postlaminectomy syndrome, lumbar region 07/03/2017  . Chronic lumbar radiculopathy 07/03/2017  . Shingles outbreak 11/28/2016  . Acute conjunctivitis of right eye 11/28/2016  .  Vision blurred 11/28/2016  . Bilateral hearing loss 11/28/2016  . Nasal sore 11/28/2016  . Acute pharyngitis 07/03/2016  . UTI (urinary tract infection) 11/30/2015  . Abdominal pain 09/14/2015  . Nausea without vomiting 07/31/2015  . Chronic right hip pain 07/02/2015  . Sciatica of right side 06/30/2015  . Concussion without loss of consciousness 05/23/2015  . Low back pain radiating to left lower extremity 12/22/2014  . Dizziness 08/11/2014  . DDD (degenerative disc disease), lumbosacral 04/21/2014  . Renal cyst 01/28/2014  . Microscopic hematuria 04/20/2013  . Ulcerative colitis (H[facility]) 05/06/2012  . Spinal disease 03/29/2012  . Arthralgia 11/01/2011  . Osteoarthritis 08/17/2010  . GERD 08/17/2010  . Dyslipidemia 02/19/2008  . ALLERGIC RHINITIS CAUSE UNSPECIFIED 05/26/2007  . Hypothyroidism 03/21/2007  . Anxiety state 03/21/2007  . Depression 03/21/2007  . Attention deficit disorder of adult 03/21/2007  . Essential hypertension 03/21/2007  . Asthma 03/21/2007  . Irritable bowel syndrome 03/21/2007    Rhiannon  PT  03/26/2018, 3:14 PM  [facility] [address] [place], [place], [address] Phone: [phone]   Fax:  [phone]  Name: Elexa G Kaczorowski MRN: [id number] Date of Birth: 05/13/1946   

## 2018-04-03 ENCOUNTER — Encounter

## 2018-04-07 ENCOUNTER — Ambulatory Visit: Payer: Medicare Other | Admitting: Physical Therapy

## 2018-04-07 ENCOUNTER — Encounter: Payer: Self-pay | Admitting: Physical Therapy

## 2018-04-07 ENCOUNTER — Encounter

## 2018-04-07 ENCOUNTER — Other Ambulatory Visit: Payer: Self-pay | Admitting: Internal Medicine

## 2018-04-07 DIAGNOSIS — M25651 Stiffness of right hip, not elsewhere classified: Secondary | ICD-10-CM

## 2018-04-07 DIAGNOSIS — R2689 Other abnormalities of gait and mobility: Secondary | ICD-10-CM | POA: Diagnosis not present

## 2018-04-07 DIAGNOSIS — M25652 Stiffness of left hip, not elsewhere classified: Secondary | ICD-10-CM | POA: Diagnosis not present

## 2018-04-07 DIAGNOSIS — M25661 Stiffness of right knee, not elsewhere classified: Secondary | ICD-10-CM

## 2018-04-07 DIAGNOSIS — R293 Abnormal posture: Secondary | ICD-10-CM

## 2018-04-07 DIAGNOSIS — M6281 Muscle weakness (generalized): Secondary | ICD-10-CM | POA: Diagnosis not present

## 2018-04-07 NOTE — Therapy (Signed)
[place], [place], [address] Phone: [phone]   Fax:  [phone]  Physical Therapy Treatment  Patient Details  Name: [name] MRN: [id number] Date of Birth: 09/17/1945 Referring Provider (PT): Dr Alysia Penna   Encounter Date: 04/07/2018  PT End of Session - 04/07/18 1021    Visit Number  11    Number of Visits  16    Date for PT Re-Evaluation  04/30/18    Authorization Type  MCR, kx at visit 15    PT Start Time  1021    PT Stop Time  1100    PT Time Calculation (min)  39 min    Activity Tolerance  Patient tolerated treatment well    Behavior During Therapy  [facility] for tasks assessed/performed       Past Medical History:  Diagnosis Date  . Allergy   . Anxiety   . Asthma   . Attention deficit disorder without mention of hyperactivity   . Back pain   . Colitis 12/05/2010  . Colon polyps   . Depression   . Diverticulosis   . GERD (gastroesophageal reflux disease)   . H/O hiatal hernia   . Hypertension   . IBS (irritable bowel syndrome)   . Osteoarthrosis, unspecified whether generalized or localized, unspecified site   . Other and unspecified hyperlipidemia   . Renal cyst 10/2015   simple left  . Thyroid disease   . Ulcerative colitis   . Unspecified asthma(493.90)   . Unspecified essential hypertension    taken off meds since lost wt    Past Surgical History:  Procedure Laterality Date  . ABDOMINAL EXPOSURE N/A 04/22/2014   Procedure: ABDOMINAL EXPOSURE;  Surgeon: Angelia Mould, MD;  Location: [place] [facility];  Service: Vascular;  Laterality: N/A;  . ANTERIOR LATERAL LUMBAR FUSION 4 LEVELS Left 04/22/2014   Procedure: Left Lumbar one/two, two/three, three/four, four,five. Anterior lateral lumbar interbody fusion**Stage 1**;  Surgeon: Erline Levine, MD;  Location: [place] [facility];  Service: Neurosurgery;  Laterality: Left;  . ANTERIOR LUMBAR FUSION N/A 04/22/2014    Procedure: Lumbar five/-Sacral one. Anterior lumbar interbody fusion with Dr. Scot Dock; Left Lumbar one/two, two/three/three/four, four/five. Anterior lateral lumbar interbody fusion**Stage 1**;  Surgeon: Erline Levine, MD;  Location: [place] [facility];  Service: Neurosurgery;  Laterality: N/A;  . BACK SURGERY     fluid removed from between disks  . BREAST SURGERY     bil breast reduction   . CHOLECYSTECTOMY     Laparoscopic cholecystectomy  . COLONOSCOPY    . HIP FRACTURE SURGERY     left  11/01/2013  . LUMBAR PERCUTANEOUS PEDICLE SCREW 4 LEVEL N/A 04/25/2014   Procedure: **Stage 2** Percutaneous pedicle screw placement L1-5;  Surgeon: Erline Levine, MD;  Location: [place] [facility];  Service: Neurosurgery;  Laterality: N/A;  **Stage 2** Percutaneous pedicle screw placement L1-5  . SHOULDER SURGERY     right shoulder 3x   . TOE SURGERY     right big toe  . TOTAL SHOULDER ARTHROPLASTY    . UPPER GASTROINTESTINAL ENDOSCOPY  09/28/2016   Dilation    There were no vitals filed for this visit.  Subjective Assessment - 04/07/18 1023    Subjective  "I feel like I need to get a new but I am unable to do that due to the issues with family and the new dx of my husbands Dx of ALS"    Currently in Pain?  Yes  Pain Score  4     Pain Orientation  Right    Pain Descriptors / Indicators  Sore;Throbbing    Pain Type  Chronic pain    Pain Onset  More than a month ago    Pain Frequency  Intermittent    Aggravating Factors   walking     Pain Relieving Factors  resting                       [facility] Adult PT Treatment/Exercise - 04/07/18 1027      Self-Care   Self-Care  Other Self-Care Comments    Other Self-Care Comments   provided heel lift in the l shoe due to LLD noted during assessment      Knee/Hip Exercises: Stretches   Other Knee/Hip Stretches  adductor stretch 2 x 30 sec hold       Manual Therapy   Manual therapy comments  MTRP along the pectineus, adductor longus, and glute  medius/ minimus    Joint Mobilization  R hip distraction  grade 3-4 mobs graduallly     Soft tissue mobilization  IASTM along the R proximal hip adductors and glute medius             PT Education - 04/07/18 1121    Education Details  benefits of heel lift in the L shoe to promote pelvic equality ,and to move the heel lift from shoe to shoe if she changes shoes    Person(s) Educated  Patient    Methods  Explanation;Verbal cues;Handout    Comprehension  Verbalized understanding;Verbal cues required       PT Short Term Goals - 03/19/18 1352      PT SHORT TERM GOAL #1   Title  I with initial HEP     Status  Achieved      PT SHORT TERM GOAL #2   Title  report decrease pain in Rt hip =/> 25% with use of home TENs unit  to allow her to walk more    Status  Achieved      PT SHORT TERM GOAL #3   Title  increase Rt knee extension to =/better than -5 degrees to decrease amount of trendelenburg with gait    Status  On-going        PT Long Term Goals - 03/19/18 1353      PT LONG TERM GOAL #1   Title  I with advanced HEP to include a pool program    Time  6    Period  Weeks    Status  On-going    Target Date  04/30/18      PT LONG TERM GOAL #2   Title  increase bilat hip/knee strength =/> 4-4+/5 to assist with mobility    Time  6    Period  Weeks    Status  Partially Met    Target Date  04/30/18      PT LONG TERM GOAL #3   Title  improve FOTO =/< 52% limited    Baseline  44% limited    Status  Achieved      PT LONG TERM GOAL #4   Title  improve bilat hip extension =/> 10 degrees to assist in normalizing gait and allow her to stand upright    Time  6    Period  Weeks    Status  On-going    Target Date  04/30/18      PT LONG  TERM GOAL #5   Title  report =/> 50% improvement in abilty to perform her gardening     Time  6    Period  Weeks    Status  On-going    Target Date  04/30/18            Plan - 04/07/18 1123    Clinical Impression Statement  pt  reported significant soreness with DN, focused on manual techniques to relieve proximal adductor tightness followed with stretching and mobilizations. provided heel lift for the L shoe due to LLD which could contribute to increased compression of the R hip. end of session she reported decreased pain and tightness noting 2/10 pain walking out.    PT Next Visit Plan  Continue manual therapy and exercise progression as tolerated. DN PRN. hows heel lift? hip strengthening    Consulted and Agree with Plan of Care  Patient       Patient will benefit from skilled therapeutic intervention in order to improve the following deficits and impairments:  Abnormal gait, Pain, Improper body mechanics, Increased muscle spasms, Decreased range of motion, Decreased strength, Difficulty walking  Visit Diagnosis: Muscle weakness (generalized)  Stiffness of right hip, not elsewhere classified  Stiffness of left hip, not elsewhere classified  Stiffness of right knee, not elsewhere classified  Other abnormalities of gait and mobility  Abnormal posture     Problem List Patient Active Problem List   Diagnosis Date Noted  . Preop exam for internal medicine 12/07/2017  . Chronic tonsillitis 12/05/2017  . Postlaminectomy syndrome, lumbar region 07/03/2017  . Chronic lumbar radiculopathy 07/03/2017  . Shingles outbreak 11/28/2016  . Acute conjunctivitis of right eye 11/28/2016  . Vision blurred 11/28/2016  . Bilateral hearing loss 11/28/2016  . Nasal sore 11/28/2016  . Acute pharyngitis 07/03/2016  . UTI (urinary tract infection) 11/30/2015  . Abdominal pain 09/14/2015  . Nausea without vomiting 07/31/2015  . Chronic right hip pain 07/02/2015  . Sciatica of right side 06/30/2015  . Concussion without loss of consciousness 05/23/2015  . Low back pain radiating to left lower extremity 12/22/2014  . Dizziness 08/11/2014  . DDD (degenerative disc disease), lumbosacral 04/21/2014  . Renal cyst 01/28/2014   . Microscopic hematuria 04/20/2013  . Ulcerative colitis ([place]) 05/06/2012  . Spinal disease 03/29/2012  . Arthralgia 11/01/2011  . Osteoarthritis 08/17/2010  . GERD 08/17/2010  . Dyslipidemia 02/19/2008  . ALLERGIC RHINITIS CAUSE UNSPECIFIED 05/26/2007  . Hypothyroidism 03/21/2007  . Anxiety state 03/21/2007  . Depression 03/21/2007  . Attention deficit disorder of adult 03/21/2007  . Essential hypertension 03/21/2007  . Asthma 03/21/2007  . Irritable bowel syndrome 03/21/2007   Starr [place] PT, DPT, LAT, ATC  04/07/18  11:27 AM      [facility] [address] [place], [place], [address] Phone: [phone]   Fax:  [phone]  Name: ILAH BOULE MRN: [id number] Date of Birth: Jul 30, 1945

## 2018-04-08 ENCOUNTER — Ambulatory Visit: Payer: Medicare Other | Admitting: Physical Therapy

## 2018-04-08 DIAGNOSIS — R2689 Other abnormalities of gait and mobility: Secondary | ICD-10-CM

## 2018-04-08 DIAGNOSIS — M25661 Stiffness of right knee, not elsewhere classified: Secondary | ICD-10-CM | POA: Diagnosis not present

## 2018-04-08 DIAGNOSIS — R293 Abnormal posture: Secondary | ICD-10-CM

## 2018-04-08 DIAGNOSIS — M25652 Stiffness of left hip, not elsewhere classified: Secondary | ICD-10-CM

## 2018-04-08 DIAGNOSIS — M25651 Stiffness of right hip, not elsewhere classified: Secondary | ICD-10-CM

## 2018-04-08 DIAGNOSIS — M6281 Muscle weakness (generalized): Secondary | ICD-10-CM | POA: Diagnosis not present

## 2018-04-08 NOTE — Therapy (Signed)
[place], [place], [address] Phone: [phone]   Fax:  [phone]  Physical Therapy Treatment  Patient Details  Name: [name] MRN: [id number] Date of Birth: July 23, 1945 Referring Provider (PT): Dr Alysia Penna   Encounter Date: 04/08/2018  PT End of Session - 04/08/18 1644    Visit Number  12    Number of Visits  16    Date for PT Re-Evaluation  04/30/18    Authorization Type  MCR, kx at visit 15    PT Start Time  1541    PT Stop Time  1635    PT Time Calculation (min)  54 min    Activity Tolerance  Patient tolerated treatment well    Behavior During Therapy  [facility] for tasks assessed/performed       Past Medical History:  Diagnosis Date  . Allergy   . Anxiety   . Asthma   . Attention deficit disorder without mention of hyperactivity   . Back pain   . Colitis 12/05/2010  . Colon polyps   . Depression   . Diverticulosis   . GERD (gastroesophageal reflux disease)   . H/O hiatal hernia   . Hypertension   . IBS (irritable bowel syndrome)   . Osteoarthrosis, unspecified whether generalized or localized, unspecified site   . Other and unspecified hyperlipidemia   . Renal cyst 10/2015   simple left  . Thyroid disease   . Ulcerative colitis   . Unspecified asthma(493.90)   . Unspecified essential hypertension    taken off meds since lost wt    Past Surgical History:  Procedure Laterality Date  . ABDOMINAL EXPOSURE N/A 04/22/2014   Procedure: ABDOMINAL EXPOSURE;  Surgeon: Angelia Mould, MD;  Location: [place] [facility];  Service: Vascular;  Laterality: N/A;  . ANTERIOR LATERAL LUMBAR FUSION 4 LEVELS Left 04/22/2014   Procedure: Left Lumbar one/two, two/three, three/four, four,five. Anterior lateral lumbar interbody fusion**Stage 1**;  Surgeon: Erline Levine, MD;  Location: [place] [facility];  Service: Neurosurgery;  Laterality: Left;  . ANTERIOR LUMBAR FUSION N/A 04/22/2014   Procedure: Lumbar five/-Sacral one. Anterior lumbar interbody fusion with Dr. Scot Dock; Left Lumbar one/two, two/three/three/four, four/five. Anterior lateral lumbar interbody fusion**Stage 1**;  Surgeon: Erline Levine, MD;  Location: [place] [facility];  Service: Neurosurgery;  Laterality: N/A;  . BACK SURGERY     fluid removed from between disks  . BREAST SURGERY     bil breast reduction   . CHOLECYSTECTOMY     Laparoscopic cholecystectomy  . COLONOSCOPY    . HIP FRACTURE SURGERY     left  11/01/2013  . LUMBAR PERCUTANEOUS PEDICLE SCREW 4 LEVEL N/A 04/25/2014   Procedure: **Stage 2** Percutaneous pedicle screw placement L1-5;  Surgeon: Erline Levine, MD;  Location: [place] [facility];  Service: Neurosurgery;  Laterality: N/A;  **Stage 2** Percutaneous pedicle screw placement L1-5  . SHOULDER SURGERY     right shoulder 3x   . TOE SURGERY     right big toe  . TOTAL SHOULDER ARTHROPLASTY    . UPPER GASTROINTESTINAL ENDOSCOPY  09/28/2016   Dilation    There were no vitals filed for this visit.  Subjective Assessment - 04/08/18 1637    Subjective  Pt. reports use of heel lift issued at last visit "delightful" to help with walking. She does note soreness with manual tx. from last session to right hip but otherwise reports no pain pre-tx.    Currently in Pain?  No/denies  Pain Score  0-No pain                       [facility] Adult PT Treatment/Exercise - 04/08/18 0001      Exercises   Exercises  Knee/Hip      Knee/Hip Exercises: Stretches   Quad Stretch  Right;3 reps;30 seconds   supine from [employer] position at edge of table   Piriformis Stretch  Right;3 reps;30 seconds    Other Knee/Hip Stretches  adductor stretch 3 x 30 sec hold       Knee/Hip Exercises: Standing   Knee Flexion  Right;2 sets;10 reps   front SLR in standing with 2 lb. ankle weight   Hip Abduction  Right;2 sets;10 reps   2 lb. ankle weight     Knee/Hip Exercises: Seated   Long Arc Quad  Right;2  sets;10 reps    Long Arc Quad Weight  2 lbs.    Hamstring Curl  Right;2 sets;10 reps    Hamstring Limitations  red Theraband      Knee/Hip Exercises: Supine   Hip Adduction Isometric  Both;20 reps    Other Supine Knee/Hip Exercises  SAQ right side 2x10 with 2 lb. weight      Modalities   Modalities  Moist Heat;Electrical Stimulation      Moist Heat Therapy   Number Minutes Moist Heat  10 Minutes    Moist Heat Location  Hip   posterolateral hip     Electrical Stimulation   Electrical Stimulation Location  R posterolateral hip/glut    Electrical Stimulation Action  IFC    Electrical Stimulation Parameters  to tolerance    Electrical Stimulation Goals  Pain      Manual Therapy   Manual Therapy  Joint mobilization    Joint Mobilization  R hip distraction  grade 3-4 mobs graduallly              PT Education - 04/08/18 1643    Education Details  POC, HEP    Person(s) Educated  Patient    Methods  Explanation;Demonstration       PT Short Term Goals - 03/19/18 1352      PT SHORT TERM GOAL #1   Title  I with initial HEP     Status  Achieved      PT SHORT TERM GOAL #2   Title  report decrease pain in Rt hip =/> 25% with use of home TENs unit  to allow her to walk more    Status  Achieved      PT SHORT TERM GOAL #3   Title  increase Rt knee extension to =/better than -5 degrees to decrease amount of trendelenburg with gait    Status  On-going        PT Long Term Goals - 03/19/18 1353      PT LONG TERM GOAL #1   Title  I with advanced HEP to include a pool program    Time  6    Period  Weeks    Status  On-going    Target Date  04/30/18      PT LONG TERM GOAL #2   Title  increase bilat hip/knee strength =/> 4-4+/5 to assist with mobility    Time  6    Period  Weeks    Status  Partially Met    Target Date  04/30/18      PT LONG TERM GOAL #3  Title  improve FOTO =/< 52% limited    Baseline  44% limited    Status  Achieved      PT LONG TERM GOAL #4    Title  improve bilat hip extension =/> 10 degrees to assist in normalizing gait and allow her to stand upright    Time  6    Period  Weeks    Status  On-going    Target Date  04/30/18      PT LONG TERM GOAL #5   Title  report =/> 50% improvement in abilty to perform her gardening     Time  6    Period  Weeks    Status  On-going    Target Date  04/30/18            Plan - 04/08/18 1644    Clinical Impression Statement  Improved gait mechanics/benefit for walking tolerance with addition heel lift. More strengthening emphasis today due to soreness from yesterday's manual tx. Fair progress/potential gievn underlying degenerative changes but given pt. unable to undergo THA or TKA at this time pt. has been able to make functional improvements wiith mobility status.    Rehab Potential  Fair    PT Frequency  2x / week    PT Duration  6 weeks    PT Treatment/Interventions  Iontophoresis 59m/ml Dexamethasone;Gait training;Stair training;Neuromuscular re-education;Dry needling;Manual techniques;Moist Heat;Ultrasound;Patient/family education;Taping;Therapeutic exercise;Cryotherapy;Electrical Stimulation    PT Next Visit Plan  Continue hip and knee strengthening, functional activities, manual therapy and further dry needling as needed, pt. will continue with use heel lift given benefit    PT Home Exercise Plan  LAQ, standing R SLRs    Consulted and Agree with Plan of Care  Patient       Patient will benefit from skilled therapeutic intervention in order to improve the following deficits and impairments:  Abnormal gait, Pain, Improper body mechanics, Increased muscle spasms, Decreased range of motion, Decreased strength, Difficulty walking  Visit Diagnosis: Muscle weakness (generalized)  Stiffness of right hip, not elsewhere classified  Stiffness of right knee, not elsewhere classified  Stiffness of left hip, not elsewhere classified  Other abnormalities of gait and mobility  Abnormal  posture     Problem List Patient Active Problem List   Diagnosis Date Noted  . Preop exam for internal medicine 12/07/2017  . Chronic tonsillitis 12/05/2017  . Postlaminectomy syndrome, lumbar region 07/03/2017  . Chronic lumbar radiculopathy 07/03/2017  . Shingles outbreak 11/28/2016  . Acute conjunctivitis of right eye 11/28/2016  . Vision blurred 11/28/2016  . Bilateral hearing loss 11/28/2016  . Nasal sore 11/28/2016  . Acute pharyngitis 07/03/2016  . UTI (urinary tract infection) 11/30/2015  . Abdominal pain 09/14/2015  . Nausea without vomiting 07/31/2015  . Chronic right hip pain 07/02/2015  . Sciatica of right side 06/30/2015  . Concussion without loss of consciousness 05/23/2015  . Low back pain radiating to left lower extremity 12/22/2014  . Dizziness 08/11/2014  . DDD (degenerative disc disease), lumbosacral 04/21/2014  . Renal cyst 01/28/2014  . Microscopic hematuria 04/20/2013  . [place]cerative colitis ([place] 05/06/2012  . Spinal disease 03/29/2012  . Arthralgia 11/01/2011  . Osteoarthritis 08/17/2010  . GERD 08/17/2010  . Dyslipidemia 02/19/2008  . ALLERGIC RHINITIS CAUSE UNSPECIFIED 05/26/2007  . Hypothyroidism 03/21/2007  . Anxiety state 03/21/2007  . Depression 03/21/2007  . Attention deficit disorder of adult 03/21/2007  . Essential hypertension 03/21/2007  . Asthma 03/21/2007  . Irritable bowel syndrome 03/21/2007  Beaulah Dinning, PT, DPT 04/08/18 4:50 PM  [facility] [address] [place], [place], [address] Phone: [phone]   Fax:  [phone]  Name: LASHONDA SONNEBORN MRN: [id number] Date of Birth: 1946/05/02

## 2018-04-16 DIAGNOSIS — J309 Allergic rhinitis, unspecified: Secondary | ICD-10-CM | POA: Diagnosis not present

## 2018-04-16 DIAGNOSIS — H1033 Unspecified acute conjunctivitis, bilateral: Secondary | ICD-10-CM | POA: Diagnosis not present

## 2018-04-27 ENCOUNTER — Encounter: Payer: Medicare Other | Admitting: Physical Therapy

## 2018-04-29 ENCOUNTER — Encounter: Payer: Self-pay | Admitting: Physical Therapy

## 2018-04-29 ENCOUNTER — Other Ambulatory Visit: Payer: Self-pay | Admitting: Internal Medicine

## 2018-04-29 ENCOUNTER — Ambulatory Visit: Payer: Medicare Other | Attending: Physical Medicine & Rehabilitation | Admitting: Physical Therapy

## 2018-04-29 DIAGNOSIS — M25651 Stiffness of right hip, not elsewhere classified: Secondary | ICD-10-CM | POA: Diagnosis not present

## 2018-04-29 DIAGNOSIS — M6281 Muscle weakness (generalized): Secondary | ICD-10-CM | POA: Diagnosis not present

## 2018-04-29 DIAGNOSIS — R293 Abnormal posture: Secondary | ICD-10-CM | POA: Diagnosis not present

## 2018-04-29 DIAGNOSIS — R2689 Other abnormalities of gait and mobility: Secondary | ICD-10-CM | POA: Diagnosis not present

## 2018-04-29 DIAGNOSIS — M25652 Stiffness of left hip, not elsewhere classified: Secondary | ICD-10-CM

## 2018-04-29 DIAGNOSIS — M25661 Stiffness of right knee, not elsewhere classified: Secondary | ICD-10-CM

## 2018-04-29 NOTE — Therapy (Signed)
[place], [place], [address] Phone: [phone]   Fax:  [phone]  Physical Therapy Treatment Progress Note Reporting Period 04/29/2018 to 05/27/2018  See note below for Objective Data and Assessment of Progress/Goals.       Patient Details  Name: [name] MRN: [id number] Date of Birth: 1946/01/02 Referring Provider (PT): Dr Alysia Penna   Encounter Date: 04/29/2018  PT End of Session - 04/29/18 1422    Visit Number  13    Number of Visits  20    Date for PT Re-Evaluation  05/27/18    Authorization Type  MCR, kx at visit 15    PT Start Time  1105    PT Stop Time  1200    PT Time Calculation (min)  55 min    Activity Tolerance  Patient tolerated treatment well    Behavior During Therapy  [facility] for tasks assessed/performed       Past Medical History:  Diagnosis Date  . Allergy   . Anxiety   . Asthma   . Attention deficit disorder without mention of hyperactivity   . Back pain   . Colitis 12/05/2010  . Colon polyps   . Depression   . Diverticulosis   . GERD (gastroesophageal reflux disease)   . H/O hiatal hernia   . Hypertension   . IBS (irritable bowel syndrome)   . Osteoarthrosis, unspecified whether generalized or localized, unspecified site   . Other and unspecified hyperlipidemia   . Renal cyst 10/2015   simple left  . Thyroid disease   . Ulcerative colitis   . Unspecified asthma(493.90)   . Unspecified essential hypertension    taken off meds since lost wt    Past Surgical History:  Procedure Laterality Date  . ABDOMINAL EXPOSURE N/A 04/22/2014   Procedure: ABDOMINAL EXPOSURE;  Surgeon: Angelia Mould, MD;  Location: [place] [facility];  Service: Vascular;  Laterality: N/A;  . ANTERIOR LATERAL LUMBAR FUSION 4 LEVELS Left 04/22/2014   Procedure: Left Lumbar one/two, two/three, three/four, four,five. Anterior lateral lumbar interbody fusion**Stage 1**;  Surgeon:  Erline Levine, MD;  Location: [place] [facility];  Service: Neurosurgery;  Laterality: Left;  . ANTERIOR LUMBAR FUSION N/A 04/22/2014   Procedure: Lumbar five/-Sacral one. Anterior lumbar interbody fusion with Dr. Scot Dock; Left Lumbar one/two, two/three/three/four, four/five. Anterior lateral lumbar interbody fusion**Stage 1**;  Surgeon: Erline Levine, MD;  Location: [place] [facility];  Service: Neurosurgery;  Laterality: N/A;  . BACK SURGERY     fluid removed from between disks  . BREAST SURGERY     bil breast reduction   . CHOLECYSTECTOMY     Laparoscopic cholecystectomy  . COLONOSCOPY    . HIP FRACTURE SURGERY     left  11/01/2013  . LUMBAR PERCUTANEOUS PEDICLE SCREW 4 LEVEL N/A 04/25/2014   Procedure: **Stage 2** Percutaneous pedicle screw placement L1-5;  Surgeon: Erline Levine, MD;  Location: [place] [facility];  Service: Neurosurgery;  Laterality: N/A;  **Stage 2** Percutaneous pedicle screw placement L1-5  . SHOULDER SURGERY     right shoulder 3x   . TOE SURGERY     right big toe  . TOTAL SHOULDER ARTHROPLASTY    . UPPER GASTROINTESTINAL ENDOSCOPY  09/28/2016   Dilation    There were no vitals filed for this visit.  Subjective Assessment - 04/29/18 1305    Subjective  Pt. returns, not seen for therapy since 04/08/18 due to travel to [place] to visit family. She reports  back and hip did better with more dry climate. Symptoms re-exacerbated last evening without spccific exacerbating cause. Primary pain is right lower lumbar region and posterolateral hip along with anterior hip and groin. As noted previously patient's spouse has been diagnosed with ALS and she cannot undergo THA at this time due to physical demands for caregiving.    Pertinent History  Lt THA 2015, needs Rt THA - pt holding off on this, Rt knee pain - bone on bone    How long can you sit comfortably?  1 hour    How long can you stand comfortably?  10 minutes    How long can you walk comfortably?  10 minutes    Diagnostic tests  MRI,  X-rays    Patient Stated Goals  get legs stronger, walk without cane if it is possible using regular pattern    Currently in Pain?  Yes    Pain Score  3     Pain Location  Groin    Pain Orientation  Right    Pain Descriptors / Indicators  Sore;Stabbing    Pain Type  Chronic pain    Pain Radiating Towards  right thigh    Pain Onset  More than a month ago    Pain Frequency  Intermittent    Aggravating Factors   walking    Pain Relieving Factors  rest    Pain Score  3    Pain Location  Back    Pain Orientation  Right;Lower    Pain Descriptors / Indicators  Aching    Pain Type  Chronic pain    Pain Onset  More than a month ago    Pain Frequency  Intermittent    Aggravating Factors   standing and walking    Pain Relieving Factors  rest    Effect of Pain on Daily Activities  limited tolerance standing and ambulation for community mobility         [facility] PT Assessment - 04/29/18 0001      Observation/Other Assessments   Focus on Therapeutic Outcomes (FOTO)   59% limited      AROM   Right/Left Knee  Right    Right Knee Extension  -10    Right Knee Flexion  125    Left Knee Extension  130    Left Knee Flexion  -3    Lumbar Flexion  to floor, mild pain on return to standing    Lumbar Extension  25%    Lumbar - Right Rotation  75%    Lumbar - Left Rotation  50%      Strength   Right Hip Flexion  4+/5    Right Hip Extension  4/5    Right Hip ABduction  4/5    Left Hip Flexion  5/5    Left Hip Extension  4+/5    Left Hip ABduction  4+/5    Right/Left Knee  --   5/5 knee strength bilaterally                  [facility] Adult PT Treatment/Exercise - 04/29/18 0001      Exercises   Exercises  Lumbar;Knee/Hip      Lumbar Exercises: Stretches   ITB Stretch  Right;3 reps;30 seconds    Piriformis Stretch  Right;3 reps;30 seconds      Lumbar Exercises: Supine   Clam  20 reps    Clam Limitations  Green Theraband    Bent Knee Raise  20  reps   with posterior pelvic  tilt   Other Supine Lumbar Exercises  hpi add. isometric with ball x 20      Knee/Hip Exercises: Stretches   ITB Stretch  Left;3 reps;30 seconds    Piriformis Stretch  Right;3 reps;30 seconds    Other Knee/Hip Stretches  adductor stretch 3x30 sec      Knee/Hip Exercises: Supine   Hip Adduction Isometric  Both;20 reps      Modalities   Modalities  Moist Heat      Moist Heat Therapy   Number Minutes Moist Heat  15 Minutes    Moist Heat Location  Lumbar Spine;Hip      Manual Therapy   Manual Therapy  Joint mobilization;Soft tissue mobilization    Joint Mobilization  R hip long axis distraction    Soft tissue mobilization  STM right glut incl. IASTM             PT Education - 04/29/18 1422    Education Details  POC    Person(s) Educated  Patient    Methods  Explanation    Comprehension  Verbalized understanding       PT Short Term Goals - 04/29/18 1427      PT SHORT TERM GOAL #1   Title  I with initial HEP     Baseline  met for initial HEP, plan update next session    Time  1    Period  Weeks    Status  Revised      PT SHORT TERM GOAL #3   Title  increase Rt knee extension to =/better than -5 degrees to decrease amount of trendelenburg with gait    Baseline  lacking 10 deg    Time  2    Period  Weeks    Status  On-going        PT Long Term Goals - 04/29/18 1428      PT LONG TERM GOAL #1   Title  I with advanced HEP to include a pool program    Time  4    Period  Weeks    Status  On-going    Target Date  05/27/18      PT LONG TERM GOAL #2   Title  increase bilat hip/knee strength =/> 4-4+/5 to assist with mobility    Time  4    Period  Weeks    Status  On-going    Target Date  05/27/18      PT LONG TERM GOAL #3   Title  improve FOTO =/< 52% limited    Baseline  59% limited    Time  4    Period  Weeks    Status  On-going    Target Date  05/27/18      PT LONG TERM GOAL #4   Title  improve bilat hip extension =/> 10 degrees to assist in  normalizing gait and allow her to stand upright    Time  4    Period  Weeks    Status  On-going    Target Date  05/27/18      PT LONG TERM GOAL #5   Title  report =/> 50% improvement in abilty to perform her gardening     Time  4    Period  Weeks    Status  On-going    Target Date  05/27/18            Plan - 04/29/18 1423  Clinical Impression Statement  Pt. returns after several week absence from therapy due to travel with some setback in progress. She has made functional gains in terms of walking tolerance from previous status but continues with hip weakness and knee stiffess with underlying OA and lumbar degenerative changes. Plan resume/continue PT for several more weeks then transition to independent community exercise program/aquatics if possible.     History and Personal Factors relevant to plan of care:  underyling level of OA/degenerative changes    Clinical Presentation  Stable    Clinical Presentation due to:  multiple medical/orthopedic issues    Clinical Decision Making  Low    Rehab Potential  Fair    Clinical Impairments Affecting Rehab Potential  underlying significant OA    PT Frequency  2x / week    PT Duration  4 weeks    PT Treatment/Interventions  Iontophoresis 85m/ml Dexamethasone;Gait training;Stair training;Neuromuscular re-education;Dry needling;Manual techniques;Moist Heat;Ultrasound;Patient/family education;Taping;Therapeutic exercise;Cryotherapy;Electrical Stimulation    PT Next Visit Plan  Next session focus HEP progressoin    PT Home Exercise Plan  LAQ, standing R SLRs    Consulted and Agree with Plan of Care  Patient       Patient will benefit from skilled therapeutic intervention in order to improve the following deficits and impairments:  Abnormal gait, Pain, Improper body mechanics, Increased muscle spasms, Decreased range of motion, Decreased strength, Difficulty walking, Decreased activity tolerance, Impaired flexibility  Visit  Diagnosis: Muscle weakness (generalized)  Stiffness of right hip, not elsewhere classified  Stiffness of right knee, not elsewhere classified  Stiffness of left hip, not elsewhere classified  Other abnormalities of gait and mobility  Abnormal posture     Problem List Patient Active Problem List   Diagnosis Date Noted  . Preop exam for internal medicine 12/07/2017  . Chronic tonsillitis 12/05/2017  . Postlaminectomy syndrome, lumbar region 07/03/2017  . Chronic lumbar radiculopathy 07/03/2017  . Shingles outbreak 11/28/2016  . Acute conjunctivitis of right eye 11/28/2016  . Vision blurred 11/28/2016  . Bilateral hearing loss 11/28/2016  . Nasal sore 11/28/2016  . Acute pharyngitis 07/03/2016  . UTI (urinary tract infection) 11/30/2015  . Abdominal pain 09/14/2015  . Nausea without vomiting 07/31/2015  . Chronic right hip pain 07/02/2015  . Sciatica of right side 06/30/2015  . Concussion without loss of consciousness 05/23/2015  . Low back pain radiating to left lower extremity 12/22/2014  . Dizziness 08/11/2014  . DDD (degenerative disc disease), lumbosacral 04/21/2014  . Renal cyst 01/28/2014  . Microscopic hematuria 04/20/2013  . [place] 05/06/2012  . Spinal disease 03/29/2012  . Arthralgia 11/01/2011  . Osteoarthritis 08/17/2010  . GERD 08/17/2010  . Dyslipidemia 02/19/2008  . ALLERGIC RHINITIS CAUSE UNSPECIFIED 05/26/2007  . Hypothyroidism 03/21/2007  . Anxiety state 03/21/2007  . Depression 03/21/2007  . Attention deficit disorder of adult 03/21/2007  . Essential hypertension 03/21/2007  . Asthma 03/21/2007  . Irritable bowel syndrome 03/21/2007    CBeaulah Dinning PT, DPT 04/29/18 2:36 PM  [facility] [place] [address]Phone: [phone]  Fax:  [phone] Name: Tammy BEISERMRN: [id number] of Birth: [birth year]

## 2018-04-30 ENCOUNTER — Encounter: Payer: Medicare Other | Admitting: Physical Therapy

## 2018-05-01 ENCOUNTER — Encounter: Payer: Self-pay | Admitting: Physical Therapy

## 2018-05-01 ENCOUNTER — Ambulatory Visit: Payer: Medicare Other | Admitting: Physical Therapy

## 2018-05-01 DIAGNOSIS — R293 Abnormal posture: Secondary | ICD-10-CM

## 2018-05-01 DIAGNOSIS — M6281 Muscle weakness (generalized): Secondary | ICD-10-CM

## 2018-05-01 DIAGNOSIS — M25661 Stiffness of right knee, not elsewhere classified: Secondary | ICD-10-CM

## 2018-05-01 DIAGNOSIS — M1611 Unilateral primary osteoarthritis, right hip: Secondary | ICD-10-CM | POA: Diagnosis not present

## 2018-05-01 DIAGNOSIS — M25652 Stiffness of left hip, not elsewhere classified: Secondary | ICD-10-CM

## 2018-05-01 DIAGNOSIS — M25651 Stiffness of right hip, not elsewhere classified: Secondary | ICD-10-CM

## 2018-05-01 DIAGNOSIS — M25551 Pain in right hip: Secondary | ICD-10-CM | POA: Diagnosis not present

## 2018-05-01 DIAGNOSIS — R2689 Other abnormalities of gait and mobility: Secondary | ICD-10-CM | POA: Diagnosis not present

## 2018-05-01 NOTE — Therapy (Signed)
[place], [place], [address] Phone: [phone]   Fax:  [phone]  Physical Therapy Treatment  Patient Details  Name: [name] MRN: [id number] Date of Birth: 1946-01-08 Referring Provider (PT): Dr Alysia Penna   Encounter Date: 05/01/2018  PT End of Session - 05/01/18 1147    Visit Number  14    Number of Visits  20    Date for PT Re-Evaluation  05/27/18    Authorization Type  MCR, kx at visit 15    PT Start Time  1101    PT Stop Time  1153    PT Time Calculation (min)  52 min    Activity Tolerance  Patient tolerated treatment well    Behavior During Therapy  [facility] for tasks assessed/performed       Past Medical History:  Diagnosis Date  . Allergy   . Anxiety   . Asthma   . Attention deficit disorder without mention of hyperactivity   . Back pain   . Colitis 12/05/2010  . Colon polyps   . Depression   . Diverticulosis   . GERD (gastroesophageal reflux disease)   . H/O hiatal hernia   . Hypertension   . IBS (irritable bowel syndrome)   . Osteoarthrosis, unspecified whether generalized or localized, unspecified site   . Other and unspecified hyperlipidemia   . Renal cyst 10/2015   simple left  . Thyroid disease   . Ulcerative colitis   . Unspecified asthma(493.90)   . Unspecified essential hypertension    taken off meds since lost wt    Past Surgical History:  Procedure Laterality Date  . ABDOMINAL EXPOSURE N/A 04/22/2014   Procedure: ABDOMINAL EXPOSURE;  Surgeon: Angelia Mould, MD;  Location: [place] [facility];  Service: Vascular;  Laterality: N/A;  . ANTERIOR LATERAL LUMBAR FUSION 4 LEVELS Left 04/22/2014   Procedure: Left Lumbar one/two, two/three, three/four, four,five. Anterior lateral lumbar interbody fusion**Stage 1**;  Surgeon: Erline Levine, MD;  Location: [place] [facility];  Service: Neurosurgery;  Laterality: Left;  . ANTERIOR LUMBAR FUSION N/A 04/22/2014   Procedure: Lumbar five/-Sacral one. Anterior lumbar interbody fusion with Dr. Scot Dock; Left Lumbar one/two, two/three/three/four, four/five. Anterior lateral lumbar interbody fusion**Stage 1**;  Surgeon: Erline Levine, MD;  Location: [place] [facility];  Service: Neurosurgery;  Laterality: N/A;  . BACK SURGERY     fluid removed from between disks  . BREAST SURGERY     bil breast reduction   . CHOLECYSTECTOMY     Laparoscopic cholecystectomy  . COLONOSCOPY    . HIP FRACTURE SURGERY     left  11/01/2013  . LUMBAR PERCUTANEOUS PEDICLE SCREW 4 LEVEL N/A 04/25/2014   Procedure: **Stage 2** Percutaneous pedicle screw placement L1-5;  Surgeon: Erline Levine, MD;  Location: [place] [facility];  Service: Neurosurgery;  Laterality: N/A;  **Stage 2** Percutaneous pedicle screw placement L1-5  . SHOULDER SURGERY     right shoulder 3x   . TOE SURGERY     right big toe  . TOTAL SHOULDER ARTHROPLASTY    . UPPER GASTROINTESTINAL ENDOSCOPY  09/28/2016   Dilation    There were no vitals filed for this visit.  Subjective Assessment - 05/01/18 1107    Subjective  Pt. has decided to see Dr. Alvan Dame for consult regarding potential THA-she has decided to potentially consider surgery while her husband (who has ALS) is still mobile. Discussed potential d/c of therapy for hip if pending surgery but pt. wishes to wait  until after MD visit to decide and will contact clinic. Pt. reports sore after last session from combination of PT and attempting to garden so requests more passive tx. focus today.                       [facility] Adult PT Treatment/Exercise - 05/01/18 0001      Lumbar Exercises: Stretches   ITB Stretch  Right;3 reps;30 seconds    Piriformis Stretch  Right;3 reps;30 seconds      Lumbar Exercises: Aerobic   Nustep  L1x5 min      Lumbar Exercises: Supine   Clam  20 reps    Clam Limitations  Green Theraband    Other Supine Lumbar Exercises  hip add. isometrics x 20      Knee/Hip Exercises:  Stretches   Other Knee/Hip Stretches  adductor stretch 3x30 sec      Cryotherapy   Number Minutes Cryotherapy  10 Minutes    Cryotherapy Location  Hip      Manual Therapy   Manual Therapy  Joint mobilization;Soft tissue mobilization    Joint Mobilization  R long axis hip distraction grade I-III    Soft tissue mobilization  STM, IASTM right posterolateral hip-glut and piriformis             PT Education - 05/01/18 1147    Education Details  POC    Person(s) Educated  Patient    Methods  Explanation    Comprehension  Verbalized understanding       PT Short Term Goals - 04/29/18 1427      PT SHORT TERM GOAL #1   Title  I with initial HEP     Baseline  met for initial HEP, plan update next session    Time  1    Period  Weeks    Status  Revised      PT SHORT TERM GOAL #3   Title  increase Rt knee extension to =/better than -5 degrees to decrease amount of trendelenburg with gait    Baseline  lacking 10 deg    Time  2    Period  Weeks    Status  On-going        PT Long Term Goals - 04/29/18 1428      PT LONG TERM GOAL #1   Title  I with advanced HEP to include a pool program    Time  4    Period  Weeks    Status  On-going    Target Date  05/27/18      PT LONG TERM GOAL #2   Title  increase bilat hip/knee strength =/> 4-4+/5 to assist with mobility    Time  4    Period  Weeks    Status  On-going    Target Date  05/27/18      PT LONG TERM GOAL #3   Title  improve FOTO =/< 52% limited    Baseline  59% limited    Time  4    Period  Weeks    Status  On-going    Target Date  05/27/18      PT LONG TERM GOAL #4   Title  improve bilat hip extension =/> 10 degrees to assist in normalizing gait and allow her to stand upright    Time  4    Period  Weeks    Status  On-going    Target Date  05/27/18  PT LONG TERM GOAL #5   Title  report =/> 50% improvement in abilty to perform her gardening     Time  4    Period  Weeks    Status  On-going    Target  Date  05/27/18            Plan - 05/01/18 1148    Clinical Impression Statement  Continues with right hip and knee pain with underlying OA. Pt. will consult with surgeon and if deciding to go through with procedure would plan tentative d/c therapy-will await further contact with pt. after MD visit.    PT Frequency  2x / week    PT Duration  4 weeks    PT Treatment/Interventions  Iontophoresis 36m/ml Dexamethasone;Gait training;Stair training;Neuromuscular re-education;Dry needling;Manual techniques;Moist Heat;Ultrasound;Patient/family education;Taping;Therapeutic exercise;Cryotherapy;Electrical Stimulation    PT Next Visit Plan  Await status from MD visit re: further POC    PT Home Exercise Plan  LAQ, standing R SLRs    Consulted and Agree with Plan of Care  Patient       Patient will benefit from skilled therapeutic intervention in order to improve the following deficits and impairments:  Abnormal gait, Pain, Improper body mechanics, Increased muscle spasms, Decreased range of motion, Decreased strength, Difficulty walking, Decreased activity tolerance, Impaired flexibility  Visit Diagnosis: Muscle weakness (generalized)  Stiffness of right hip, not elsewhere classified  Stiffness of right knee, not elsewhere classified  Stiffness of left hip, not elsewhere classified  Other abnormalities of gait and mobility  Abnormal posture     Problem List Patient Active Problem List   Diagnosis Date Noted  . Preop exam for internal medicine 12/07/2017  . Chronic tonsillitis 12/05/2017  . Postlaminectomy syndrome, lumbar region 07/03/2017  . Chronic lumbar radiculopathy 07/03/2017  . Shingles outbreak 11/28/2016  . Acute conjunctivitis of right eye 11/28/2016  . Vision blurred 11/28/2016  . Bilateral hearing loss 11/28/2016  . Nasal sore 11/28/2016  . Acute pharyngitis 07/03/2016  . UTI (urinary tract infection) 11/30/2015  . Abdominal pain 09/14/2015  . Nausea without  vomiting 07/31/2015  . Chronic right hip pain 07/02/2015  . Sciatica of right side 06/30/2015  . Concussion without loss of consciousness 05/23/2015  . Low back pain radiating to left lower extremity 12/22/2014  . Dizziness 08/11/2014  . DDD (degenerative disc disease), lumbosacral 04/21/2014  . Renal cyst 01/28/2014  . Microscopic hematuria 04/20/2013  . Ulcerative colitis ([place] 05/06/2012  . Spinal disease 03/29/2012  . Arthralgia 11/01/2011  . Osteoarthritis 08/17/2010  . GERD 08/17/2010  . Dyslipidemia 02/19/2008  . ALLERGIC RHINITIS CAUSE UNSPECIFIED 05/26/2007  . Hypothyroidism 03/21/2007  . Anxiety state 03/21/2007  . Depression 03/21/2007  . Attention deficit disorder of adult 03/21/2007  . Essential hypertension 03/21/2007  . Asthma 03/21/2007  . Irritable bowel syndrome 03/21/2007    CBeaulah Dinning PT, DPT 05/01/18 11:51 AM  [facility] Center156 [address] AvenueGWrightstown N[place] [address]Phone: [phone]  Fax:  [phone] Name: SBRIAUNNA GRINDSTAFFMRN: [id number] of Birth: 601-10-1945

## 2018-05-04 DIAGNOSIS — H52223 Regular astigmatism, bilateral: Secondary | ICD-10-CM | POA: Diagnosis not present

## 2018-05-04 DIAGNOSIS — H5203 Hypermetropia, bilateral: Secondary | ICD-10-CM | POA: Diagnosis not present

## 2018-05-04 DIAGNOSIS — H40013 Open angle with borderline findings, low risk, bilateral: Secondary | ICD-10-CM | POA: Diagnosis not present

## 2018-05-04 DIAGNOSIS — H524 Presbyopia: Secondary | ICD-10-CM | POA: Diagnosis not present

## 2018-05-06 ENCOUNTER — Ambulatory Visit: Payer: Medicare Other | Admitting: Physical Therapy

## 2018-05-07 DIAGNOSIS — F3175 Bipolar disorder, in partial remission, most recent episode depressed: Secondary | ICD-10-CM | POA: Diagnosis not present

## 2018-05-07 DIAGNOSIS — F900 Attention-deficit hyperactivity disorder, predominantly inattentive type: Secondary | ICD-10-CM | POA: Diagnosis not present

## 2018-05-08 ENCOUNTER — Ambulatory Visit: Payer: Medicare Other | Admitting: Physical Therapy

## 2018-05-10 ENCOUNTER — Other Ambulatory Visit: Payer: Self-pay | Admitting: Internal Medicine

## 2018-05-11 ENCOUNTER — Ambulatory Visit (INDEPENDENT_AMBULATORY_CARE_PROVIDER_SITE_OTHER): Payer: Medicare Other | Admitting: Family

## 2018-05-11 ENCOUNTER — Other Ambulatory Visit: Payer: Medicare Other

## 2018-05-11 ENCOUNTER — Encounter: Payer: Self-pay | Admitting: Family

## 2018-05-11 ENCOUNTER — Ambulatory Visit (INDEPENDENT_AMBULATORY_CARE_PROVIDER_SITE_OTHER)
Admission: RE | Admit: 2018-05-11 | Discharge: 2018-05-11 | Disposition: A | Discharge status: Home / Self Care | Payer: Medicare Other | Source: Ambulatory Visit | Attending: Family | Admitting: Family

## 2018-05-11 VITALS — BP 128/76 | HR 72 | Temp 98.5°F | Ht 65.0 in | Wt 156.0 lb

## 2018-05-11 DIAGNOSIS — R059 Cough, unspecified: Secondary | ICD-10-CM

## 2018-05-11 DIAGNOSIS — R05 Cough: Secondary | ICD-10-CM

## 2018-05-11 DIAGNOSIS — R079 Chest pain, unspecified: Secondary | ICD-10-CM | POA: Diagnosis not present

## 2018-05-11 DIAGNOSIS — R109 Unspecified abdominal pain: Secondary | ICD-10-CM

## 2018-05-11 MED ORDER — DOXYCYCLINE HYCLATE 100 MG PO TABS: 100.0000 mg | ORAL_TABLET | Freq: Two times a day (BID) | ORAL | 0 refills | Status: DC

## 2018-05-11 NOTE — Progress Notes (Signed)
[name] is a 72 y.o. female with the following history as recorded in EpicCare:  Patient Active Problem List   Diagnosis Date Noted  . Preop exam for internal medicine 12/07/2017  . Chronic tonsillitis 12/05/2017  . Postlaminectomy syndrome, lumbar region 07/03/2017  . Chronic lumbar radiculopathy 07/03/2017  . Shingles outbreak 11/28/2016  . Acute conjunctivitis of right eye 11/28/2016  . Vision blurred 11/28/2016  . Bilateral hearing loss 11/28/2016  . Nasal sore 11/28/2016  . Acute pharyngitis 07/03/2016  . UTI (urinary tract infection) 11/30/2015  . Abdominal pain 09/14/2015  . Nausea without vomiting 07/31/2015  . Chronic right hip pain 07/02/2015  . Sciatica of right side 06/30/2015  . Concussion without loss of consciousness 05/23/2015  . Low back pain radiating to left lower extremity 12/22/2014  . Dizziness 08/11/2014  . DDD (degenerative disc disease), lumbosacral 04/21/2014  . Renal cyst 01/28/2014  . Microscopic hematuria 04/20/2013  . Ulcerative colitis ([place]) 05/06/2012  . Spinal disease 03/29/2012  . Arthralgia 11/01/2011  . Osteoarthritis 08/17/2010  . GERD 08/17/2010  . Dyslipidemia 02/19/2008  . ALLERGIC RHINITIS CAUSE UNSPECIFIED 05/26/2007  . Hypothyroidism 03/21/2007  . Anxiety state 03/21/2007  . Depression 03/21/2007  . Attention deficit disorder of adult 03/21/2007  . Essential hypertension 03/21/2007  . Asthma 03/21/2007  . Irritable bowel syndrome 03/21/2007    Current Outpatient Medications  Medication Sig Dispense Refill  . ALPRAZolam (XANAX) 0.5 MG tablet Take 1 tablet (0.5 mg total) by mouth 2 (two) times daily as needed for anxiety or sleep. 60 tablet 1  . amLODipine-valsartan (EXFORGE) 5-160 MG tablet Take 1 tablet by mouth daily. 90 tablet 1  . buPROPion (WELLBUTRIN XL) 300 MG 24 hr tablet Take 1 tablet (300 mg total) by mouth daily. 90 tablet 1  . Cholecalciferol (VITAMIN D PO) Take 1 tablet by mouth daily.    . CONCERTA 36 MG  CR tablet Take 36 mg by mouth daily.     . Cyanocobalamin (VITAMIN B-12 PO) Take 2 tablets by mouth daily.    . Diclofenac-miSOPROStol 75-0.2 MG TBEC TAKE ONE TABLET TWICE DAILY AS NEEDED 60 tablet 3  . FLUoxetine (PROZAC) 20 MG capsule Take 3 capsules (60 mg total) by mouth daily. 90 capsule 3  . inFLIXimab in sodium chloride 0.9 % Inject 5 mg/kg into the vein every 3 (three) months. 4 vials of 132m    . lamoTRIgine (LAMICTAL) 100 MG tablet Take 1 tablet by mouth 3 (three) times daily.    .Marland Kitchenloratadine (CLARITIN) 10 MG tablet Take 10 mg by mouth daily.    . Melatonin 5 MG TABS Take 10 mg by mouth at bedtime.    . methocarbamol (ROBAXIN) 500 MG tablet TAKE ONE TABLET EVERY EIGHT HOURS AS NEEDED FOR MUSCLE SPASMS 90 tablet 3  . pantoprazole (PROTONIX) 40 MG tablet TAKE ONE TABLET TWICE DAILY 60 tablet 11  . polymyxin B 500,000 Units in sodium chloride irrigation 0.9 % 500 mL Irrigate with as directed once.    . Probiotic Product (PROBIOTIC PO) Take 1 capsule by mouth daily.    .Marland KitchenSYNTHROID 50 MCG tablet TAKE 2 TABLETS EVERY MORNING 180 tablet 1  . traMADol (ULTRAM) 50 MG tablet Take 1-2 tablets (50-100 mg total) by mouth every 6 (six) hours as needed for severe pain. 120 tablet 1  . VENTOLIN HFA 108 (90 Base) MCG/ACT inhaler ONE PUFF FOUR TIMES DAILY AS NEEDED 18 g 3  . Cranberry 1000 MG CAPS As directed (Patient not taking:  Reported on 05/11/2018) 30 each 11  . doxycycline (VIBRA-TABS) 100 MG tablet Take 1 tablet (100 mg total) by mouth 2 (two) times daily. 20 tablet 0   Current Facility-Administered Medications  Medication Dose Route Frequency Provider Last Rate Last Dose  . 0.9 %  sodium chloride infusion  500 mL Intravenous Continuous Irene Shipper, MD        Allergies: Ampicillin; Erythromycin; Hctz [hydrochlorothiazide]; Metoprolol; Molds & smuts; Morphine and related; and Penicillin g  Past Medical History:  Diagnosis Date  . Allergy   . Anxiety   . Asthma   . Attention deficit  disorder without mention of hyperactivity   . Back pain   . Colitis 12/05/2010  . Colon polyps   . Depression   . Diverticulosis   . GERD (gastroesophageal reflux disease)   . H/O hiatal hernia   . Hypertension   . IBS (irritable bowel syndrome)   . Osteoarthrosis, unspecified whether generalized or localized, unspecified site   . Other and unspecified hyperlipidemia   . Renal cyst 10/2015   simple left  . Thyroid disease   . Ulcerative colitis   . Unspecified asthma(493.90)   . Unspecified essential hypertension    taken off meds since lost wt    Past Surgical History:  Procedure Laterality Date  . ABDOMINAL EXPOSURE N/A 04/22/2014   Procedure: ABDOMINAL EXPOSURE;  Surgeon: Angelia Mould, MD;  Location: [place] [facility];  Service: Vascular;  Laterality: N/A;  . ANTERIOR LATERAL LUMBAR FUSION 4 LEVELS Left 04/22/2014   Procedure: Left Lumbar one/two, two/three, three/four, four,five. Anterior lateral lumbar interbody fusion**Stage 1**;  Surgeon: Erline Levine, MD;  Location: [place] [facility];  Service: Neurosurgery;  Laterality: Left;  . ANTERIOR LUMBAR FUSION N/A 04/22/2014   Procedure: Lumbar five/-Sacral one. Anterior lumbar interbody fusion with Dr. Scot Dock; Left Lumbar one/two, two/three/three/four, four/five. Anterior lateral lumbar interbody fusion**Stage 1**;  Surgeon: Erline Levine, MD;  Location: [place] [facility];  Service: Neurosurgery;  Laterality: N/A;  . BACK SURGERY     fluid removed from between disks  . BREAST SURGERY     bil breast reduction   . CHOLECYSTECTOMY     Laparoscopic cholecystectomy  . COLONOSCOPY    . HIP FRACTURE SURGERY     left  11/01/2013  . LUMBAR PERCUTANEOUS PEDICLE SCREW 4 LEVEL N/A 04/25/2014   Procedure: **Stage 2** Percutaneous pedicle screw placement L1-5;  Surgeon: Erline Levine, MD;  Location: [place] [facility];  Service: Neurosurgery;  Laterality: N/A;  **Stage 2** Percutaneous pedicle screw placement L1-5  . SHOULDER SURGERY     right shoulder  3x   . TOE SURGERY     right big toe  . TOTAL SHOULDER ARTHROPLASTY    . UPPER GASTROINTESTINAL ENDOSCOPY  09/28/2016   Dilation    Family History  Problem Relation Age of Onset  . Hypertension Father   . Colon cancer Father 24  . Arthritis Father   . Heart disease Father        CHF  . Heart disease Mother        Died, 50  . Healthy Brother   . Healthy Daughter   . Diabetes Neg Hx   . Breast cancer Neg Hx     Social History   Tobacco Use  . Smoking status: Former Smoker    Last attempt to quit: 10/30/1978    Years since quitting: 39.5  . Smokeless tobacco: Never Used  Substance Use Topics  . Alcohol use: Yes  Alcohol/week: 1.0 standard drinks    Types: 1 Standard drinks or equivalent per week    Comment: once a week    Subjective:  5 day history of cough/ congestion; accompanied by her daughter who helps provide history; + wheezing; does have prescription for albuterol- not having to use;  Also complaining of left sided back pain; no burning with urination or frequency; has known arthritis in the back and hip; is very concerned that something in her kidneys is causing the symptoms; scheduled for right hip replacement on December 31; has prescription for Tramadol- admits not taking as prescribed/ may only take 2/ 24 hours;    Objective:  Vitals:   05/11/18 1406  BP: 128/76  Pulse: 72  Temp: 98.5 F (36.9 C)  TempSrc: Oral  SpO2: 94%  Weight: 156 lb (70.8 kg)  Height: 5' 5"  (1.651 m)    General: Well developed, well nourished, in no acute distress  Skin : Warm and dry.  Head: Normocephalic and atraumatic  Eyes: Sclera and conjunctiva clear; pupils round and reactive to light; extraocular movements intact  Ears: External normal; canals clear; tympanic membranes normal  Oropharynx: Pink, supple. No suspicious lesions  Neck: Supple without thyromegaly, adenopathy  Lungs: Respirations unlabored; clear to auscultation bilaterally without wheeze, rales, rhonchi  CVS  exam: normal rate and regular rhythm.  Musculoskeletal: No deformities; tender to palpation over left flank- negative for CVA tenderness  Extremities: No edema, cyanosis, clubbing  Vessels: Symmetric bilaterally  Neurologic: Alert and oriented; speech intact; face symmetrical; uses walker; CNII-XII intact without focal deficit   Assessment:  1. Flank pain   2. Cough     Plan:  1. Suspect muscular/ arthritis; will update urine culture to rule out infection as well due to patient concerns; patient was not able to get her Remicade last week as scheduled- this could explain increased amount of pain; discussed using prednisone but she defers; she does finally agree to use the Tramadol as prescribed- 2 every 4-6 hours as needed; can call back if she feels like she needs short-term prescription for Norco; 2. Will update CXR due to patient and daughter's concern about her flank pain- will rule out pneumonia; will treat with Doxycycline and encouraged to use her rescue inhaler; increase fluids, rest and follow-up worse, no better.   No follow-ups on file.  Orders Placed This Encounter  Procedures  . Urine Culture    Standing Status:   Future    Number of Occurrences:   1    Standing Expiration Date:   05/11/2019  . DG Chest 2 View    Standing Status:   Future    Number of Occurrences:   1    Standing Expiration Date:   07/12/2019    Order Specific Question:   Reason for Exam (SYMPTOM  OR DIAGNOSIS REQUIRED)    Answer:   cough/ flank pain    Order Specific Question:   Preferred imaging location?    Answer:   [facility]    Order Specific Question:   Radiology Contrast Protocol - do NOT remove file path    Answer:   [email or web address]    Requested Prescriptions   Signed Prescriptions Disp Refills  . doxycycline (VIBRA-TABS) 100 MG tablet 20 tablet 0    Sig: Take 1 tablet (100 mg total) by mouth 2 (two) times daily.

## 2018-05-12 LAB — URINE CULTURE
MICRO NUMBER:: 91385937
SPECIMEN QUALITY:: ADEQUATE

## 2018-05-25 ENCOUNTER — Telehealth: Payer: Self-pay

## 2018-05-25 NOTE — Telephone Encounter (Signed)
Patient called requesting appointment for back injections.

## 2018-05-25 NOTE — Telephone Encounter (Signed)
Does she have right or left sided low back pain or is it primarily leg pain ?

## 2018-05-27 NOTE — Telephone Encounter (Signed)
Called and left voicemail for patient to call us back with further details of pain.

## 2018-05-29 NOTE — Telephone Encounter (Signed)
Please schedule for repeat right L5 dorsal ramus, right S1 S2-3 lateral branch blocks at next availability

## 2018-05-29 NOTE — Telephone Encounter (Signed)
Called patient, she stated that the pain is in the sacroiliac area again on the right hand side.

## 2018-06-01 NOTE — Telephone Encounter (Deleted)
Please schedule for repeat right L5 dorsal ramus, right S1 S2-3 lateral branch blocks at next availability 

## 2018-06-02 NOTE — Therapy (Signed)
[facility] [place], [place], [address] Phone: [phone]   Fax:  [phone]  Physical Therapy Treatment/Discharge  Patient Details  Name: [name] MRN: [id number] Date of Birth: 10/25/1945 Referring Provider (PT): Dr Alysia Penna   Encounter Date: 05/01/2018    Past Medical History:  Diagnosis Date  . Allergy   . Anxiety   . Asthma   . Attention deficit disorder without mention of hyperactivity   . Back pain   . Colitis 12/05/2010  . Colon polyps   . Depression   . Diverticulosis   . GERD (gastroesophageal reflux disease)   . H/O hiatal hernia   . Hypertension   . IBS (irritable bowel syndrome)   . Osteoarthrosis, unspecified whether generalized or localized, unspecified site   . Other and unspecified hyperlipidemia   . Renal cyst 10/2015   simple left  . Thyroid disease   . Ulcerative colitis   . Unspecified asthma(493.90)   . Unspecified essential hypertension    taken off meds since lost wt    Past Surgical History:  Procedure Laterality Date  . ABDOMINAL EXPOSURE N/A 04/22/2014   Procedure: ABDOMINAL EXPOSURE;  Surgeon: Angelia Mould, MD;  Location: [place] [facility];  Service: Vascular;  Laterality: N/A;  . ANTERIOR LATERAL LUMBAR FUSION 4 LEVELS Left 04/22/2014   Procedure: Left Lumbar one/two, two/three, three/four, four,five. Anterior lateral lumbar interbody fusion**Stage 1**;  Surgeon: Erline Levine, MD;  Location: [place] [facility];  Service: Neurosurgery;  Laterality: Left;  . ANTERIOR LUMBAR FUSION N/A 04/22/2014   Procedure: Lumbar five/-Sacral one. Anterior lumbar interbody fusion with Dr. Scot Dock; Left Lumbar one/two, two/three/three/four, four/five. Anterior lateral lumbar interbody fusion**Stage 1**;  Surgeon: Erline Levine, MD;  Location: [place] [facility];  Service: Neurosurgery;  Laterality: N/A;  . BACK SURGERY     fluid removed from between disks  . BREAST SURGERY     bil  breast reduction   . CHOLECYSTECTOMY     Laparoscopic cholecystectomy  . COLONOSCOPY    . HIP FRACTURE SURGERY     left  11/01/2013  . LUMBAR PERCUTANEOUS PEDICLE SCREW 4 LEVEL N/A 04/25/2014   Procedure: **Stage 2** Percutaneous pedicle screw placement L1-5;  Surgeon: Erline Levine, MD;  Location: [place] [facility];  Service: Neurosurgery;  Laterality: N/A;  **Stage 2** Percutaneous pedicle screw placement L1-5  . SHOULDER SURGERY     right shoulder 3x   . TOE SURGERY     right big toe  . TOTAL SHOULDER ARTHROPLASTY    . UPPER GASTROINTESTINAL ENDOSCOPY  09/28/2016   Dilation    There were no vitals filed for this visit.                              PT Short Term Goals - 04/29/18 1427      PT SHORT TERM GOAL #1   Title  I with initial HEP     Baseline  met for initial HEP, plan update next session    Time  1    Period  Weeks    Status  Revised      PT SHORT TERM GOAL #3   Title  increase Rt knee extension to =/better than -5 degrees to decrease amount of trendelenburg with gait    Baseline  lacking 10 deg    Time  2    Period  Weeks    Status  On-going  PT Long Term Goals - 04/29/18 1428      PT LONG TERM GOAL #1   Title  I with advanced HEP to include a pool program    Time  4    Period  Weeks    Status  On-going    Target Date  05/27/18      PT LONG TERM GOAL #2   Title  increase bilat hip/knee strength =/> 4-4+/5 to assist with mobility    Time  4    Period  Weeks    Status  On-going    Target Date  05/27/18      PT LONG TERM GOAL #3   Title  improve FOTO =/< 52% limited    Baseline  59% limited    Time  4    Period  Weeks    Status  On-going    Target Date  05/27/18      PT LONG TERM GOAL #4   Title  improve bilat hip extension =/> 10 degrees to assist in normalizing gait and allow her to stand upright    Time  4    Period  Weeks    Status  On-going    Target Date  05/27/18      PT LONG TERM GOAL #5   Title  report  =/> 50% improvement in abilty to perform her gardening     Time  4    Period  Weeks    Status  On-going    Target Date  05/27/18              Patient will benefit from skilled therapeutic intervention in order to improve the following deficits and impairments:  Abnormal gait, Pain, Improper body mechanics, Increased muscle spasms, Decreased range of motion, Decreased strength, Difficulty walking, Decreased activity tolerance, Impaired flexibility  Visit Diagnosis: Muscle weakness (generalized)  Stiffness of right hip, not elsewhere classified  Stiffness of right knee, not elsewhere classified  Stiffness of left hip, not elsewhere classified  Other abnormalities of gait and mobility  Abnormal posture     Problem List Patient Active Problem List   Diagnosis Date Noted  . Preop exam for internal medicine 12/07/2017  . Chronic tonsillitis 12/05/2017  . Postlaminectomy syndrome, lumbar region 07/03/2017  . Chronic lumbar radiculopathy 07/03/2017  . Shingles outbreak 11/28/2016  . Acute conjunctivitis of right eye 11/28/2016  . Vision blurred 11/28/2016  . Bilateral hearing loss 11/28/2016  . Nasal sore 11/28/2016  . Acute pharyngitis 07/03/2016  . UTI (urinary tract infection) 11/30/2015  . Abdominal pain 09/14/2015  . Nausea without vomiting 07/31/2015  . Chronic right hip pain 07/02/2015  . Sciatica of right side 06/30/2015  . Concussion without loss of consciousness 05/23/2015  . Low back pain radiating to left lower extremity 12/22/2014  . Dizziness 08/11/2014  . DDD (degenerative disc disease), lumbosacral 04/21/2014  . Renal cyst 01/28/2014  . Microscopic hematuria 04/20/2013  . Ulcerative colitis ([place]) 05/06/2012  . Spinal disease 03/29/2012  . Arthralgia 11/01/2011  . Osteoarthritis 08/17/2010  . GERD 08/17/2010  . Dyslipidemia 02/19/2008  . ALLERGIC RHINITIS CAUSE UNSPECIFIED 05/26/2007  . Hypothyroidism 03/21/2007  . Anxiety state 03/21/2007  .  Depression 03/21/2007  . Attention deficit disorder of adult 03/21/2007  . Essential hypertension 03/21/2007  . Asthma 03/21/2007  . Irritable bowel syndrome 03/21/2007        PHYSICAL THERAPY DISCHARGE SUMMARY  Visits from Start of Care: 14  Current functional level related to goals /  functional outcomes: Pt. Did not return after last visit 05/01/18-she is pending THA. No further pre-surgical PT planned at this time. Remaining deficits: Difficulty walking now pending THA  Education / Equipment: NA Plan: Patient agrees to discharge.  Patient goals were partially met. Patient is being discharged due to not returning since the last visit.  ?????         Beaulah Dinning, PT, DPT 06/02/18 10:07 AM '     [facility] [address] [place], [place], [address] Phone: [phone]   Fax:  [phone]  Name: [name] MRN: [id number] Date of Birth: 07-14-1945

## 2018-06-04 ENCOUNTER — Encounter (HOSPITAL_COMMUNITY): Payer: Self-pay

## 2018-06-04 ENCOUNTER — Other Ambulatory Visit: Payer: Self-pay

## 2018-06-04 ENCOUNTER — Emergency Department (HOSPITAL_COMMUNITY): Payer: Medicare Other

## 2018-06-04 ENCOUNTER — Inpatient Hospital Stay (HOSPITAL_COMMUNITY)
Admission: EM | Admit: 2018-06-04 | Discharge: 2018-06-06 | DRG: 071 | Disposition: A | Discharge status: Home Health | Payer: Medicare Other | Attending: Internal Medicine | Admitting: Internal Medicine

## 2018-06-04 DIAGNOSIS — Z981 Arthrodesis status: Secondary | ICD-10-CM

## 2018-06-04 DIAGNOSIS — J45909 Unspecified asthma, uncomplicated: Secondary | ICD-10-CM | POA: Diagnosis not present

## 2018-06-04 DIAGNOSIS — G9341 Metabolic encephalopathy: Principal | ICD-10-CM | POA: Diagnosis present

## 2018-06-04 DIAGNOSIS — R270 Ataxia, unspecified: Secondary | ICD-10-CM | POA: Diagnosis not present

## 2018-06-04 DIAGNOSIS — R410 Disorientation, unspecified: Secondary | ICD-10-CM | POA: Diagnosis not present

## 2018-06-04 DIAGNOSIS — R531 Weakness: Secondary | ICD-10-CM | POA: Diagnosis not present

## 2018-06-04 DIAGNOSIS — Z79891 Long term (current) use of opiate analgesic: Secondary | ICD-10-CM

## 2018-06-04 DIAGNOSIS — F329 Major depressive disorder, single episode, unspecified: Secondary | ICD-10-CM | POA: Diagnosis present

## 2018-06-04 DIAGNOSIS — Z888 Allergy status to other drugs, medicaments and biological substances status: Secondary | ICD-10-CM

## 2018-06-04 DIAGNOSIS — I10 Essential (primary) hypertension: Secondary | ICD-10-CM | POA: Diagnosis present

## 2018-06-04 DIAGNOSIS — G9340 Encephalopathy, unspecified: Secondary | ICD-10-CM | POA: Diagnosis not present

## 2018-06-04 DIAGNOSIS — S199XXA Unspecified injury of neck, initial encounter: Secondary | ICD-10-CM | POA: Diagnosis not present

## 2018-06-04 DIAGNOSIS — Z79890 Hormone replacement therapy (postmenopausal): Secondary | ICD-10-CM

## 2018-06-04 DIAGNOSIS — Z79899 Other long term (current) drug therapy: Secondary | ICD-10-CM

## 2018-06-04 DIAGNOSIS — Z9049 Acquired absence of other specified parts of digestive tract: Secondary | ICD-10-CM

## 2018-06-04 DIAGNOSIS — R110 Nausea: Secondary | ICD-10-CM | POA: Diagnosis present

## 2018-06-04 DIAGNOSIS — Z87891 Personal history of nicotine dependence: Secondary | ICD-10-CM

## 2018-06-04 DIAGNOSIS — Z880 Allergy status to penicillin: Secondary | ICD-10-CM

## 2018-06-04 DIAGNOSIS — R4182 Altered mental status, unspecified: Secondary | ICD-10-CM

## 2018-06-04 DIAGNOSIS — S0990XA Unspecified injury of head, initial encounter: Secondary | ICD-10-CM | POA: Diagnosis not present

## 2018-06-04 DIAGNOSIS — R42 Dizziness and giddiness: Secondary | ICD-10-CM | POA: Diagnosis not present

## 2018-06-04 DIAGNOSIS — Z8261 Family history of arthritis: Secondary | ICD-10-CM

## 2018-06-04 DIAGNOSIS — N390 Urinary tract infection, site not specified: Secondary | ICD-10-CM | POA: Diagnosis present

## 2018-06-04 DIAGNOSIS — Z885 Allergy status to narcotic agent status: Secondary | ICD-10-CM

## 2018-06-04 DIAGNOSIS — W19XXXA Unspecified fall, initial encounter: Secondary | ICD-10-CM | POA: Diagnosis present

## 2018-06-04 DIAGNOSIS — Z8249 Family history of ischemic heart disease and other diseases of the circulatory system: Secondary | ICD-10-CM

## 2018-06-04 DIAGNOSIS — Z800 Family history of malignant neoplasm of digestive organs: Secondary | ICD-10-CM

## 2018-06-04 LAB — DIFFERENTIAL
Abs Immature Granulocytes: 0.03 10*3/uL (ref 0.00–0.07)
Basophils Absolute: 0 10*3/uL (ref 0.0–0.1)
Basophils Relative: 0 %
Eosinophils Absolute: 0.2 10*3/uL (ref 0.0–0.5)
Eosinophils Relative: 3 %
Immature Granulocytes: 0 %
Lymphocytes Relative: 16 %
Lymphs Abs: 1.2 10*3/uL (ref 0.7–4.0)
MONO ABS: 0.7 10*3/uL (ref 0.1–1.0)
Monocytes Relative: 9 %
Neutro Abs: 5.3 10*3/uL (ref 1.7–7.7)
Neutrophils Relative %: 72 %

## 2018-06-04 LAB — I-STAT CHEM 8, ED
BUN: 23 mg/dL (ref 8–23)
Calcium, Ion: 1.08 mmol/L — ABNORMAL LOW (ref 1.15–1.40)
Chloride: 105 mmol/L (ref 98–111)
Creatinine, Ser: 1 mg/dL (ref 0.44–1.00)
Glucose, Bld: 98 mg/dL (ref 70–99)
HCT: 41 % (ref 36.0–46.0)
Hemoglobin: 13.9 g/dL (ref 12.0–15.0)
Potassium: 4.2 mmol/L (ref 3.5–5.1)
SODIUM: 140 mmol/L (ref 135–145)
TCO2: 25 mmol/L (ref 22–32)

## 2018-06-04 LAB — URINALYSIS, ROUTINE W REFLEX MICROSCOPIC
Bilirubin Urine: NEGATIVE
Glucose, UA: NEGATIVE mg/dL
HGB URINE DIPSTICK: NEGATIVE
Ketones, ur: 5 mg/dL — AB
Nitrite: NEGATIVE
Protein, ur: NEGATIVE mg/dL
Specific Gravity, Urine: 1.017 (ref 1.005–1.030)
pH: 6 (ref 5.0–8.0)

## 2018-06-04 LAB — I-STAT TROPONIN, ED: Troponin i, poc: 0 ng/mL (ref 0.00–0.08)

## 2018-06-04 LAB — CBC
HCT: 39.1 % (ref 36.0–46.0)
Hemoglobin: 12.7 g/dL (ref 12.0–15.0)
MCH: 31.4 pg (ref 26.0–34.0)
MCHC: 32.5 g/dL (ref 30.0–36.0)
MCV: 96.5 fL (ref 80.0–100.0)
PLATELETS: 249 10*3/uL (ref 150–400)
RBC: 4.05 MIL/uL (ref 3.87–5.11)
RDW: 15.2 % (ref 11.5–15.5)
WBC: 7.5 10*3/uL (ref 4.0–10.5)
nRBC: 0 % (ref 0.0–0.2)

## 2018-06-04 LAB — COMPREHENSIVE METABOLIC PANEL
ALBUMIN: 4 g/dL (ref 3.5–5.0)
ALT: 22 U/L (ref 0–44)
ANION GAP: 11 (ref 5–15)
AST: 19 U/L (ref 15–41)
Alkaline Phosphatase: 77 U/L (ref 38–126)
BUN: 22 mg/dL (ref 8–23)
CHLORIDE: 104 mmol/L (ref 98–111)
CO2: 23 mmol/L (ref 22–32)
Calcium: 9.4 mg/dL (ref 8.9–10.3)
Creatinine, Ser: 1 mg/dL (ref 0.44–1.00)
GFR calc Af Amer: >60 mL/min (ref >60)
GFR calc non Af Amer: 56 mL/min — ABNORMAL LOW (ref >60)
Glucose, Bld: 104 mg/dL — ABNORMAL HIGH (ref 70–99)
Potassium: 4.3 mmol/L (ref 3.5–5.1)
Sodium: 138 mmol/L (ref 135–145)
TOTAL PROTEIN: 6.9 g/dL (ref 6.5–8.1)
Total Bilirubin: 0.6 mg/dL (ref 0.3–1.2)

## 2018-06-04 LAB — APTT: aPTT: 27 seconds (ref 24–36)

## 2018-06-04 LAB — PROTIME-INR
INR: 1.01
Prothrombin Time: 13.2 seconds (ref 11.4–15.2)

## 2018-06-04 LAB — AMMONIA: Ammonia: <9 umol/L — ABNORMAL LOW (ref 9–35)

## 2018-06-04 LAB — RAPID URINE DRUG SCREEN, HOSP PERFORMED
Amphetamines: NOT DETECTED
Barbiturates: NOT DETECTED
Benzodiazepines: POSITIVE — AB
Cocaine: NOT DETECTED
Opiates: NOT DETECTED
Tetrahydrocannabinol: POSITIVE — AB

## 2018-06-04 LAB — ETHANOL: Alcohol, Ethyl (B): <10 mg/dL (ref <10)

## 2018-06-04 LAB — TSH: TSH: 1.88 u[IU]/mL (ref 0.350–4.500)

## 2018-06-04 MED ORDER — ACETAMINOPHEN 325 MG PO TABS
650.0000 mg | ORAL_TABLET | Freq: Four times a day (QID) | ORAL | Status: DC | PRN
  Administered 2018-06-05 – 2018-06-06 (×3): 650 mg via ORAL
  Filled 2018-06-04 (×3): qty 2

## 2018-06-04 MED ORDER — SODIUM CHLORIDE 0.9 % IV SOLN: 1.0000 g | INTRAVENOUS | Status: DC

## 2018-06-04 MED ORDER — PANTOPRAZOLE SODIUM 40 MG PO TBEC
40.0000 mg | DELAYED_RELEASE_TABLET | Freq: Two times a day (BID) | ORAL | Status: DC
  Administered 2018-06-05 – 2018-06-06 (×4): 40 mg via ORAL
  Filled 2018-06-04 (×4): qty 1

## 2018-06-04 MED ORDER — AMLODIPINE BESYLATE 5 MG PO TABS
5.0000 mg | ORAL_TABLET | Freq: Every day | ORAL | Status: DC
  Administered 2018-06-05 – 2018-06-06 (×2): 5 mg via ORAL
  Filled 2018-06-04 (×2): qty 1

## 2018-06-04 MED ORDER — ALBUTEROL SULFATE (2.5 MG/3ML) 0.083% IN NEBU: 2.5000 mg | INHALATION_SOLUTION | Freq: Four times a day (QID) | RESPIRATORY_TRACT | Status: DC | PRN

## 2018-06-04 MED ORDER — AMLODIPINE BESYLATE-VALSARTAN 5-160 MG PO TABS: 1.0000 | ORAL_TABLET | Freq: Every day | ORAL | Status: DC

## 2018-06-04 MED ORDER — FLUOXETINE HCL 20 MG PO CAPS
60.0000 mg | ORAL_CAPSULE | Freq: Every day | ORAL | Status: DC
  Administered 2018-06-05 – 2018-06-06 (×2): 60 mg via ORAL
  Filled 2018-06-04 (×2): qty 3

## 2018-06-04 MED ORDER — LEVOTHYROXINE SODIUM 100 MCG PO TABS
100.0000 ug | ORAL_TABLET | Freq: Every day | ORAL | Status: DC
  Administered 2018-06-05 – 2018-06-06 (×2): 100 ug via ORAL
  Filled 2018-06-04 (×2): qty 1

## 2018-06-04 MED ORDER — ENOXAPARIN SODIUM 40 MG/0.4ML ~~LOC~~ SOLN
40.0000 mg | SUBCUTANEOUS | Status: DC
  Administered 2018-06-05: 40 mg via SUBCUTANEOUS
  Filled 2018-06-04 (×2): qty 0.4

## 2018-06-04 MED ORDER — IOPAMIDOL (ISOVUE-370) INJECTION 76%
75.0000 mL | Freq: Once | INTRAVENOUS | Status: AC | PRN
  Administered 2018-06-04: 75 mL via INTRAVENOUS

## 2018-06-04 MED ORDER — METHYLPHENIDATE HCL ER 18 MG PO TB24
36.0000 mg | ORAL_TABLET | Freq: Every day | ORAL | Status: DC
  Administered 2018-06-05: 36 mg via ORAL
  Filled 2018-06-04 (×2): qty 2

## 2018-06-04 MED ORDER — ONDANSETRON HCL 4 MG/2ML IJ SOLN: 4.0000 mg | Freq: Four times a day (QID) | INTRAMUSCULAR | Status: DC | PRN

## 2018-06-04 MED ORDER — IOPAMIDOL (ISOVUE-370) INJECTION 76%
INTRAVENOUS | Status: AC
  Filled 2018-06-04: qty 100

## 2018-06-04 MED ORDER — LAMOTRIGINE 100 MG PO TABS
100.0000 mg | ORAL_TABLET | Freq: Three times a day (TID) | ORAL | Status: DC
  Administered 2018-06-05 – 2018-06-06 (×5): 100 mg via ORAL
  Filled 2018-06-04 (×5): qty 1

## 2018-06-04 MED ORDER — BUPROPION HCL ER (XL) 150 MG PO TB24
300.0000 mg | ORAL_TABLET | Freq: Every day | ORAL | Status: DC
  Administered 2018-06-05 – 2018-06-06 (×2): 300 mg via ORAL
  Filled 2018-06-04 (×2): qty 2

## 2018-06-04 MED ORDER — IRBESARTAN 150 MG PO TABS
150.0000 mg | ORAL_TABLET | Freq: Every day | ORAL | Status: DC
  Administered 2018-06-05 – 2018-06-06 (×2): 150 mg via ORAL
  Filled 2018-06-04 (×2): qty 1

## 2018-06-04 MED ORDER — ACETAMINOPHEN 650 MG RE SUPP: 650.0000 mg | Freq: Four times a day (QID) | RECTAL | Status: DC | PRN

## 2018-06-04 MED ORDER — ONDANSETRON HCL 4 MG PO TABS
4.0000 mg | ORAL_TABLET | Freq: Four times a day (QID) | ORAL | Status: DC | PRN
  Administered 2018-06-06: 4 mg via ORAL
  Filled 2018-06-04: qty 1

## 2018-06-04 NOTE — ED Notes (Signed)
Called lab to check status of UA sent down at 2022. Lab states they will find it and run it.

## 2018-06-04 NOTE — H&P (Addendum)
History and Physical    BENNYE NIX [name]:[id number] DOB: Oct 30, 1945 DOA: 06/04/2018  PCP: Cassandria Anger, MD  Patient coming from: Home  I have personally briefly reviewed patient's old medical records in [place]  Chief Complaint: AMS  HPI: [name] is a 72 y.o. female with medical history significant of ADD, back pain, UC not on any med at the moment, HTN, depression.  Patient presents to the ED with c/o AMS / confusion.  Patient appeared somewhat confused on Tuesday, patient reports not feeling herself, and feeling confused this morning, however reports more significant symptoms developing after her hair appointment today.  Reports that after her hair appointment, she developed dizziness and difficulty walking.  Reports that she drove home, got out of the car, and fell down due to difficulty walking.  Denies injury from the fall.  Denies numbness, focal weakness, facial droop, difficulty speaking.  Husband had reported she spent 45 minutes trying to fold a blanket, appeared to not know how it was done.  Patient reports she feels very off balance and that she is unable to walk.   ED Course: MRI brain neg for acute finding.  Symptoms persist in ED.  UA equivocal.  Also been having intermittent nausea after eating for the past 1 month per daughter.  No abd pain.   Review of Systems: As per HPI otherwise 10 point review of systems negative.   Past Medical History:  Diagnosis Date  . Allergy   . Anxiety   . Asthma   . Attention deficit disorder without mention of hyperactivity   . Back pain   . Colitis 12/05/2010  . Colon polyps   . Depression   . Diverticulosis   . GERD (gastroesophageal reflux disease)   . H/O hiatal hernia   . Hypertension   . IBS (irritable bowel syndrome)   . Osteoarthrosis, unspecified whether generalized or localized, unspecified site   . Other and unspecified hyperlipidemia   . Renal cyst 10/2015   simple left  . Thyroid  disease   . Ulcerative colitis   . Unspecified asthma(493.90)   . Unspecified essential hypertension    taken off meds since lost wt    Past Surgical History:  Procedure Laterality Date  . ABDOMINAL EXPOSURE N/A 04/22/2014   Procedure: ABDOMINAL EXPOSURE;  Surgeon: Angelia Mould, MD;  Location: [place] [facility];  Service: Vascular;  Laterality: N/A;  . ANTERIOR LATERAL LUMBAR FUSION 4 LEVELS Left 04/22/2014   Procedure: Left Lumbar one/two, two/three, three/four, four,five. Anterior lateral lumbar interbody fusion**Stage 1**;  Surgeon: Erline Levine, MD;  Location: [place];  Service: Neurosurgery;  Laterality: Left;  . ANTERIOR LUMBAR FUSION N/A 04/22/2014   Procedure: Lumbar five/-Sacral one. Anterior lumbar interbody fusion with Dr. Scot Dock; Left Lumbar one/two, two/three/three/four, four/five. Anterior lateral lumbar interbody fusion**Stage 1**;  Surgeon: Erline Levine, MD;  Location: [place] [facility];  Service: Neurosurgery;  Laterality: N/A;  . BACK SURGERY     fluid removed from between disks  . BREAST SURGERY     bil breast reduction   . CHOLECYSTECTOMY     Laparoscopic cholecystectomy  . COLONOSCOPY    . HIP FRACTURE SURGERY     left  11/01/2013  . LUMBAR PERCUTANEOUS PEDICLE SCREW 4 LEVEL N/A 04/25/2014   Procedure: **Stage 2** Percutaneous pedicle screw placement L1-5;  Surgeon: Erline Levine, MD;  Location: [place] [facility];  Service: Neurosurgery;  Laterality: N/A;  **Stage 2** Percutaneous pedicle screw placement L1-5  .  SHOULDER SURGERY     right shoulder 3x   . TOE SURGERY     right big toe  . TOTAL SHOULDER ARTHROPLASTY    . UPPER GASTROINTESTINAL ENDOSCOPY  09/28/2016   Dilation     reports that she quit smoking about 39 years ago. She has never used smokeless tobacco. She reports current alcohol use of about 1.0 standard drinks of alcohol per week. She reports that she does not use drugs.  Allergies  Allergen Reactions  . Ampicillin Nausea And Vomiting  .  Erythromycin Nausea And Vomiting and Other (See Comments)    Can take Z pac  . Hctz [Hydrochlorothiazide] Other (See Comments)    dizzy  . Metoprolol Other (See Comments)    doublevision   . Molds & Smuts Other (See Comments)    Pt coughs and uses an inhaler.  . Morphine And Related Nausea And Vomiting  . Penicillin G Other (See Comments)    Unknown Has patient had a PCN reaction causing immediate rash, facial/tongue/throat swelling, SOB or lightheadedness with hypotension: Unknown Has patient had a PCN reaction causing severe rash involving mucus membranes or skin necrosis: Unknown Has patient had a PCN reaction that required hospitalization: Unknown Has patient had a PCN reaction occurring within the last 10 years: Unknown If all of the above answers are "NO", then may proceed with Cephalosporin use.     Family History  Problem Relation Age of Onset  . Hypertension Father   . Colon cancer Father 2  . Arthritis Father   . Heart disease Father        CHF  . Heart disease Mother        Died, 37  . Healthy Brother   . Healthy Daughter   . Diabetes Neg Hx   . Breast cancer Neg Hx      Prior to Admission medications   Medication Sig Start Date End Date Taking? Authorizing Provider  ALPRAZolam Duanne Moron) 0.5 MG tablet Take 1 tablet (0.5 mg total) by mouth 2 (two) times daily as needed for anxiety or sleep. 02/10/18  Yes Plotnikov, Evie Lacks, MD  amLODipine-valsartan (EXFORGE) 5-160 MG tablet Take 1 tablet by mouth daily. 05/11/18  Yes Plotnikov, Evie Lacks, MD  buPROPion (WELLBUTRIN XL) 300 MG 24 hr tablet Take 1 tablet (300 mg total) by mouth daily. 12/22/14  Yes Plotnikov, Evie Lacks, MD  Cholecalciferol (VITAMIN D PO) Take 1 tablet by mouth daily.   Yes [provider]  CONCERTA 36 MG CR tablet Take 36 mg by mouth daily.  12/06/14  Yes [provider]  Cyanocobalamin (VITAMIN B-12 PO) Take 1 tablet by mouth daily.    Yes [provider]  FLUoxetine  (PROZAC) 20 MG capsule Take 3 capsules (60 mg total) by mouth daily. 04/23/17  Yes Plotnikov, Evie Lacks, MD  lamoTRIgine (LAMICTAL) 100 MG tablet Take 100 mg by mouth 3 (three) times daily.  05/31/15  Yes [provider]  pantoprazole (PROTONIX) 40 MG tablet TAKE ONE TABLET TWICE DAILY Patient taking differently: Take 40 mg by mouth 2 (two) times daily.  01/13/18  Yes Plotnikov, Evie Lacks, MD  SYNTHROID 50 MCG tablet TAKE 2 TABLETS EVERY MORNING Patient taking differently: Take 100 mcg by mouth at bedtime.  04/08/18  Yes Plotnikov, Evie Lacks, MD  traMADol (ULTRAM) 50 MG tablet Take 1-2 tablets (50-100 mg total) by mouth every 6 (six) hours as needed for severe pain. 02/10/18  Yes Plotnikov, Evie Lacks, MD  VENTOLIN HFA 108 (  90 Base) MCG/ACT inhaler ONE PUFF FOUR TIMES DAILY AS NEEDED Patient taking differently: Inhale 1 puff into the lungs every 6 (six) hours as needed for wheezing.  10/12/17  Yes Plotnikov, Evie Lacks, MD  losartan-hydrochlorothiazide (HYZAAR) 50-12.5 MG per tablet Take 1 tablet by mouth daily.  09/04/11  [provider]  simvastatin (ZOCOR) 20 MG tablet Take 20 mg by mouth every evening.  09/04/11  [provider]    Physical Exam: Vitals:   06/04/18 1945 06/04/18 2015 06/04/18 2030 06/04/18 2230  BP: (!) 151/90 (!) 144/80 (!) 141/81 136/77  Pulse: 72 69 70 67  Resp: 19 18 20    Temp:      TempSrc:      SpO2: 100% 97% 97% 99%  Weight:      Height:        Constitutional: NAD, calm, comfortable Eyes: PERRL, lids and conjunctivae normal ENMT: Mucous membranes are moist. Posterior pharynx clear of any exudate or lesions.Normal dentition.  Neck: normal, supple, no masses, no thyromegaly Respiratory: clear to auscultation bilaterally, no wheezing, no crackles. Normal respiratory effort. No accessory muscle use.  Cardiovascular: Regular rate and rhythm, no murmurs / rubs / gallops. No extremity edema. 2+ pedal pulses. No carotid bruits.  Abdomen: no  tenderness, no masses palpated. No hepatosplenomegaly. Bowel sounds positive.  Musculoskeletal: no clubbing / cyanosis. No joint deformity upper and lower extremities. Good ROM, no contractures. Normal muscle tone.  Skin: no rashes, lesions, ulcers. No induration Neurologic: CN 2-12 grossly intact. Sensation intact, DTR normal. Strength 5/5 in all 4.  Psychiatric: Oriented, seems to have interrupted thoughts, interrupted speech, confusion.   Labs on Admission: I have personally reviewed following labs and imaging studies  CBC: Recent Labs  Lab 06/04/18 1751 06/04/18 1806  WBC 7.5  --   NEUTROABS 5.3  --   HGB 12.7 13.9  HCT 39.1 41.0  MCV 96.5  --   PLT 249  --    Basic Metabolic Panel: Recent Labs  Lab 06/04/18 1751 06/04/18 1806  NA 138 140  K 4.3 4.2  CL 104 105  CO2 23  --   GLUCOSE 104* 98  BUN 22 23  CREATININE 1.00 1.00  CALCIUM 9.4  --    GFR: Estimated Creatinine Clearance: 50.2 mL/min (by C-G formula based on SCr of 1 mg/dL). Liver Function Tests: Recent Labs  Lab 06/04/18 1751  AST 19  ALT 22  ALKPHOS 77  BILITOT 0.6  PROT 6.9  ALBUMIN 4.0   No results for input(s): LIPASE, AMYLASE in the last 168 hours. No results for input(s): AMMONIA in the last 168 hours. Coagulation Profile: Recent Labs  Lab 06/04/18 1751  INR 1.01   Cardiac Enzymes: No results for input(s): CKTOTAL, CKMB, CKMBINDEX, TROPONINI in the last 168 hours. BNP (last 3 results) No results for input(s): PROBNP in the last 8760 hours. HbA1C: No results for input(s): HGBA1C in the last 72 hours. CBG: No results for input(s): GLUCAP in the last 168 hours. Lipid Profile: No results for input(s): CHOL, HDL, LDLCALC, TRIG, CHOLHDL, LDLDIRECT in the last 72 hours. Thyroid Function Tests: No results for input(s): TSH, T4TOTAL, FREET4, T3FREE, THYROIDAB in the last 72 hours. Anemia Panel: No results for input(s): VITAMINB12, FOLATE, FERRITIN, TIBC, IRON, RETICCTPCT in the last 72  hours. Urine analysis:    Component Value Date/Time   COLORURINE YELLOW 06/04/2018 2022   APPEARANCEUR CLEAR 06/04/2018 2022   LABSPEC 1.017 06/04/2018 2022   PHURINE 6.0 06/04/2018 2022  GLUCOSEU NEGATIVE 06/04/2018 2022   GLUCOSEU NEGATIVE 02/10/2018 1356   HGBUR NEGATIVE 06/04/2018 2022   [place] NEGATIVE 06/04/2018 2022   KETONESUR 5 (A) 06/04/2018 2022   PROTEINUR NEGATIVE 06/04/2018 2022   UROBILINOGEN 0.2 02/10/2018 1356   NITRITE NEGATIVE 06/04/2018 2022   LEUKOCYTESUR MODERATE (A) 06/04/2018 2022    Radiological Exams on Admission: Ct Angio Head W Or Wo Contrast  Result Date: 06/04/2018 CLINICAL DATA:  Vertigo and multiple recent falls EXAM: CT ANGIOGRAPHY HEAD AND NECK TECHNIQUE: Multidetector CT imaging of the head and neck was performed using the standard protocol during bolus administration of intravenous contrast. Multiplanar CT image reconstructions and MIPs were obtained to evaluate the vascular anatomy. Carotid stenosis measurements (when applicable) are obtained utilizing NASCET criteria, using the distal internal carotid diameter as the denominator. CONTRAST:  75 mL ISOVUE-370 IOPAMIDOL (ISOVUE-370) INJECTION 76% COMPARISON:  None. FINDINGS: CT HEAD FINDINGS Brain: There is no mass, hemorrhage or extra-axial collection. The size and configuration of the ventricles and extra-axial CSF spaces are normal. There is hypoattenuation of the periventricular white matter, most commonly indicating chronic ischemic microangiopathy. Skull: The visualized skull base, calvarium and extracranial soft tissues are normal. Sinuses/Orbits: No fluid levels or advanced mucosal thickening of the visualized paranasal sinuses. No mastoid or middle ear effusion. Air-fluid levels of the maxillary and sphenoid sinuses. CTA NECK FINDINGS SKELETON: There is no bony spinal canal stenosis. No lytic or blastic lesion. OTHER NECK: Normal pharynx, larynx and major salivary glands. No cervical  lymphadenopathy. Unremarkable thyroid gland. UPPER CHEST: No pneumothorax or pleural effusion. No nodules or masses. AORTIC ARCH: There is mild calcific atherosclerosis of the aortic arch. There is no aneurysm, dissection or hemodynamically significant stenosis of the visualized ascending aorta and aortic arch. Conventional 3 vessel aortic branching pattern. The visualized proximal subclavian arteries are widely patent. RIGHT CAROTID SYSTEM: --Common carotid artery: Widely patent origin without common carotid artery dissection or aneurysm. --Internal carotid artery: No dissection, occlusion or aneurysm. There is mixed density atherosclerosis extending into the proximal ICA, resulting in stenosis. --External carotid artery: No acute abnormality. LEFT CAROTID SYSTEM: --Common carotid artery: Widely patent origin without common carotid artery dissection or aneurysm. --Internal carotid artery: No dissection, occlusion or aneurysm. There is predominantly calcified atherosclerosis extending into the proximal ICA, resulting in less than 50% stenosis. --External carotid artery: No acute abnormality. VERTEBRAL ARTERIES: Left dominant configuration. Both origins are normal. No dissection, occlusion or flow-limiting stenosis to the vertebrobasilar confluence. CTA HEAD FINDINGS ANTERIOR CIRCULATION: --Intracranial internal carotid arteries: Atherosclerotic calcification of the internal carotid arteries at the skull base without hemodynamically significant stenosis. --Anterior cerebral arteries: Normal. Both A1 segments are present. Patent anterior communicating artery. --Middle cerebral arteries: Normal. --Posterior communicating arteries: Present on the left, absent on the right. POSTERIOR CIRCULATION: --Basilar artery: Normal. --Posterior cerebral arteries: Normal. --Superior cerebellar arteries: Normal. --Inferior cerebellar arteries: Normal anterior and posterior inferior cerebellar arteries. VENOUS SINUSES: As permitted by  contrast timing, patent. ANATOMIC VARIANTS: None DELAYED PHASE: No parenchymal contrast enhancement. Review of the MIP images confirms the above findings. IMPRESSION: 1. No emergent large vessel occlusion or high-grade stenosis of the intracranial arteries. 2. Bilateral carotid bifurcation atherosclerosis with less than 50% stenosis of the proximal internal carotid arteries. 3. Air-fluid levels in the maxillary and sphenoid sinuses, which may indicate acute sinusitis in the appropriate clinical context. 4.  Aortic atherosclerosis (ICD10-I70.0). Electronically Signed   By: Ulyses Jarred M.D.   On: 06/04/2018 19:55   Ct Angio Neck W And/or  Wo Contrast  Result Date: 06/04/2018 CLINICAL DATA:  Vertigo and multiple recent falls EXAM: CT ANGIOGRAPHY HEAD AND NECK TECHNIQUE: Multidetector CT imaging of the head and neck was performed using the standard protocol during bolus administration of intravenous contrast. Multiplanar CT image reconstructions and MIPs were obtained to evaluate the vascular anatomy. Carotid stenosis measurements (when applicable) are obtained utilizing NASCET criteria, using the distal internal carotid diameter as the denominator. CONTRAST:  75 mL ISOVUE-370 IOPAMIDOL (ISOVUE-370) INJECTION 76% COMPARISON:  None. FINDINGS: CT HEAD FINDINGS Brain: There is no mass, hemorrhage or extra-axial collection. The size and configuration of the ventricles and extra-axial CSF spaces are normal. There is hypoattenuation of the periventricular white matter, most commonly indicating chronic ischemic microangiopathy. Skull: The visualized skull base, calvarium and extracranial soft tissues are normal. Sinuses/Orbits: No fluid levels or advanced mucosal thickening of the visualized paranasal sinuses. No mastoid or middle ear effusion. Air-fluid levels of the maxillary and sphenoid sinuses. CTA NECK FINDINGS SKELETON: There is no bony spinal canal stenosis. No lytic or blastic lesion. OTHER NECK: Normal pharynx,  larynx and major salivary glands. No cervical lymphadenopathy. Unremarkable thyroid gland. UPPER CHEST: No pneumothorax or pleural effusion. No nodules or masses. AORTIC ARCH: There is mild calcific atherosclerosis of the aortic arch. There is no aneurysm, dissection or hemodynamically significant stenosis of the visualized ascending aorta and aortic arch. Conventional 3 vessel aortic branching pattern. The visualized proximal subclavian arteries are widely patent. RIGHT CAROTID SYSTEM: --Common carotid artery: Widely patent origin without common carotid artery dissection or aneurysm. --Internal carotid artery: No dissection, occlusion or aneurysm. There is mixed density atherosclerosis extending into the proximal ICA, resulting in stenosis. --External carotid artery: No acute abnormality. LEFT CAROTID SYSTEM: --Common carotid artery: Widely patent origin without common carotid artery dissection or aneurysm. --Internal carotid artery: No dissection, occlusion or aneurysm. There is predominantly calcified atherosclerosis extending into the proximal ICA, resulting in less than 50% stenosis. --External carotid artery: No acute abnormality. VERTEBRAL ARTERIES: Left dominant configuration. Both origins are normal. No dissection, occlusion or flow-limiting stenosis to the vertebrobasilar confluence. CTA HEAD FINDINGS ANTERIOR CIRCULATION: --Intracranial internal carotid arteries: Atherosclerotic calcification of the internal carotid arteries at the skull base without hemodynamically significant stenosis. --Anterior cerebral arteries: Normal. Both A1 segments are present. Patent anterior communicating artery. --Middle cerebral arteries: Normal. --Posterior communicating arteries: Present on the left, absent on the right. POSTERIOR CIRCULATION: --Basilar artery: Normal. --Posterior cerebral arteries: Normal. --Superior cerebellar arteries: Normal. --Inferior cerebellar arteries: Normal anterior and posterior inferior  cerebellar arteries. VENOUS SINUSES: As permitted by contrast timing, patent. ANATOMIC VARIANTS: None DELAYED PHASE: No parenchymal contrast enhancement. Review of the MIP images confirms the above findings. IMPRESSION: 1. No emergent large vessel occlusion or high-grade stenosis of the intracranial arteries. 2. Bilateral carotid bifurcation atherosclerosis with less than 50% stenosis of the proximal internal carotid arteries. 3. Air-fluid levels in the maxillary and sphenoid sinuses, which may indicate acute sinusitis in the appropriate clinical context. 4.  Aortic atherosclerosis (ICD10-I70.0). Electronically Signed   By: Ulyses Jarred M.D.   On: 06/04/2018 19:55   Mr Brain Wo Contrast  Result Date: 06/04/2018 CLINICAL DATA:  Multiple recent falls. Dizziness. Double vision and difficulty walking. EXAM: MRI HEAD WITHOUT CONTRAST TECHNIQUE: Multiplanar, multiecho pulse sequences of the brain and surrounding structures were obtained without intravenous contrast. COMPARISON:  CTA head and neck 06/04/2018 Brain MRI 08/01/2015 FINDINGS: BRAIN: There is no acute infarct, acute hemorrhage, hydrocephalus or extra-axial collection. The midline structures are normal. No midline  shift or other mass effect. There are no old infarcts. Early confluent hyperintense T2-weighted signal of the periventricular and deep white matter, most commonly due to chronic ischemic microangiopathy. The cerebral and cerebellar volume are age-appropriate. Susceptibility-sensitive sequences show no chronic microhemorrhage or superficial siderosis. VASCULAR: Major intracranial arterial and venous sinus flow voids are normal. SKULL AND UPPER CERVICAL SPINE: Calvarial bone marrow signal is normal. There is no skull base mass. Visualized upper cervical spine and soft tissues are normal. SINUSES/ORBITS: Bilateral sphenoid and maxillary sinus fluid levels. Left mastoid effusion. The orbits are normal. IMPRESSION: 1. No acute intracranial  abnormality. 2. Findings of chronic ischemic microangiopathy. 3. Fluid levels within the maxillary and sphenoid sinuses, compatible with acute sinusitis in the appropriate context. Small left mastoid effusion. Electronically Signed   By: Ulyses Jarred M.D.   On: 06/04/2018 22:01    EKG: Independently reviewed.  Assessment/Plan Principal Problem:   Acute encephalopathy Active Problems:   Essential hypertension   Nausea without vomiting    1. Acute encephalopathy - 1. MRI brain neg 2. UA equivocal, culture pending but not really having symptoms of UTI, will hold off on ABx for the moment. 3. Concern for polypharmacy as a possible cause, patient is currently taking: welbutrin, prozac, concerta, xanax, ultram, lamictal, robaxin, and CBD oil which contains enough THC to show up on our UDS. 1. Will hold the Ultram, xanax, robaxin, CBD oil. 4. Place in obs 5. Check ammonia, check TSH 6. If still altered in AM, would obtain formal neuro consult. 2. HTN - 1. Continue home BP meds 3. Nausea - 1. THC? 2. Holding meds as above 3. If persistent then would obtain CT abd/pelvis in AM, especially given h/o UC.  DVT prophylaxis: Lovenox Code Status: Full Family Communication: Daughter at bedside Disposition Plan: Home after admit Consults called: None Admission status: Place in [place], [facility] Pager [phone] Only works nights!  If 7AM-7PM, please contact the primary day team physician taking care of patient  [email or web address] Password [facility]  06/04/2018, 10:58 PM

## 2018-06-04 NOTE — ED Triage Notes (Signed)
Pt to ED from home via EMS with multiple complaints. Patient has had multiple falls recently, only one hitting head 6-7 days ago. Two days ago pt states she went to swim like normal but couldn't function/her legs wouldn't work. 1130 this morning began feeling queasy and dizzy and then went to get her hair cut. On the drive back, she states she was having trouble keeping the car on the road. Pt fell on arrival back home but denies injury, no LOC, barely hitting head on grass. Pt was able to be helped into the house by her husband. Endorses dizziness and double vision, very unsteady on feet on arrival to ED. Pt's husband states she's been having memory problems and difficulty with normal tasks such as folding a blanket, putting a seatbelt on, etc. Pt under a lot of stress and not getting enough sleep.

## 2018-06-04 NOTE — ED Provider Notes (Signed)
[facility] DEPARTMENT Provider Note   CSN: [id number] Arrival date & time: 06/04/18  1709     History   Chief Complaint Chief Complaint  Patient presents with  . Altered Mental Status    HPI [name] is a 72 y.o. female.  HPI  72 year old female with a history of ulcerative colitis, hypertension, asthma, depression who presents with concern for confusion, dizziness, intermittent double vision and difficulty walking.  Daughter reports that patient had appeared somewhat confused on Tuesday, patient reports not feeling herself, and feeling confused this morning, however reports more significant symptoms developing after her hair appointment today.  Reports that after her hair appointment, she developed dizziness and difficulty walking.  Reports that she drove home, got out of the car, and fell down due to difficulty walking.  Denies injury from the fall.  Denies numbness, focal weakness, facial droop, difficulty speaking.  Husband had reported she spent 45 minutes trying to fold a blanket, appeared to not know how it was done.  Patient reports she feels very off balance and that she is unable to walk.  It is unclear when she was last normal by history, and other symptoms such as confusion present earlier today, and even earlier this week.  Patient reports double vision that has waxed and waned, but denies any double vision at this time.  Reports only medication changes taking her tramadol regularly every 6 hours as prescribed.     Past Medical History:  Diagnosis Date  . Allergy   . Anxiety   . Asthma   . Attention deficit disorder without mention of hyperactivity   . Back pain   . Colitis 12/05/2010  . Colon polyps   . Depression   . Diverticulosis   . GERD (gastroesophageal reflux disease)   . H/O hiatal hernia   . Hypertension   . IBS (irritable bowel syndrome)   . Osteoarthrosis, unspecified whether generalized or localized, unspecified  site   . Other and unspecified hyperlipidemia   . Renal cyst 10/2015   simple left  . Thyroid disease   . Ulcerative colitis   . Unspecified asthma(493.90)   . Unspecified essential hypertension    taken off meds since lost wt    Patient Active Problem List   Diagnosis Date Noted  . Preop exam for internal medicine 12/07/2017  . Chronic tonsillitis 12/05/2017  . Postlaminectomy syndrome, lumbar region 07/03/2017  . Chronic lumbar radiculopathy 07/03/2017  . Shingles outbreak 11/28/2016  . Acute conjunctivitis of right eye 11/28/2016  . Vision blurred 11/28/2016  . Bilateral hearing loss 11/28/2016  . Nasal sore 11/28/2016  . Acute pharyngitis 07/03/2016  . UTI (urinary tract infection) 11/30/2015  . Abdominal pain 09/14/2015  . Nausea without vomiting 07/31/2015  . Chronic right hip pain 07/02/2015  . Sciatica of right side 06/30/2015  . Concussion without loss of consciousness 05/23/2015  . Low back pain radiating to left lower extremity 12/22/2014  . Dizziness 08/11/2014  . DDD (degenerative disc disease), lumbosacral 04/21/2014  . Renal cyst 01/28/2014  . Microscopic hematuria 04/20/2013  . Ulcerative colitis ([place]) 05/06/2012  . Spinal disease 03/29/2012  . Arthralgia 11/01/2011  . Osteoarthritis 08/17/2010  . GERD 08/17/2010  . Dyslipidemia 02/19/2008  . ALLERGIC RHINITIS CAUSE UNSPECIFIED 05/26/2007  . Hypothyroidism 03/21/2007  . Anxiety state 03/21/2007  . Depression 03/21/2007  . Attention deficit disorder of adult 03/21/2007  . Essential hypertension 03/21/2007  . Asthma 03/21/2007  . Irritable bowel syndrome 03/21/2007  Past Surgical History:  Procedure Laterality Date  . ABDOMINAL EXPOSURE N/A 04/22/2014   Procedure: ABDOMINAL EXPOSURE;  Surgeon: Angelia Mould, MD;  Location: [facility];  Service: Vascular;  Laterality: N/A;  . ANTERIOR LATERAL LUMBAR FUSION 4 LEVELS Left 04/22/2014   Procedure: Left Lumbar one/two, two/three, three/four,  four,five. Anterior lateral lumbar interbody fusion**Stage 1**;  Surgeon: Erline Levine, MD;  Location: [facility];  Service: Neurosurgery;  Laterality: Left;  . ANTERIOR LUMBAR FUSION N/A 04/22/2014   Procedure: Lumbar five/-Sacral one. Anterior lumbar interbody fusion with Dr. Scot Dock; Left Lumbar one/two, two/three/three/four, four/five. Anterior lateral lumbar interbody fusion**Stage 1**;  Surgeon: Erline Levine, MD;  Location: [facility];  Service: Neurosurgery;  Laterality: N/A;  . BACK SURGERY     fluid removed from between disks  . BREAST SURGERY     bil breast reduction   . CHOLECYSTECTOMY     Laparoscopic cholecystectomy  . COLONOSCOPY    . HIP FRACTURE SURGERY     left  11/01/2013  . LUMBAR PERCUTANEOUS PEDICLE SCREW 4 LEVEL N/A 04/25/2014   Procedure: **Stage 2** Percutaneous pedicle screw placement L1-5;  Surgeon: Erline Levine, MD;  Location: [facility];  Service: Neurosurgery;  Laterality: N/A;  **Stage 2** Percutaneous pedicle screw placement L1-5  . SHOULDER SURGERY     right shoulder 3x   . TOE SURGERY     right big toe  . TOTAL SHOULDER ARTHROPLASTY    . UPPER GASTROINTESTINAL ENDOSCOPY  09/28/2016   Dilation     OB History   No obstetric history on file.      Home Medications    Prior to Admission medications   Medication Sig Start Date End Date Taking? Authorizing Provider  ALPRAZolam Duanne Moron) 0.5 MG tablet Take 1 tablet (0.5 mg total) by mouth 2 (two) times daily as needed for anxiety or sleep. 02/10/18   Plotnikov, Evie Lacks, MD  amLODipine-valsartan (EXFORGE) 5-160 MG tablet Take 1 tablet by mouth daily. 05/11/18   Plotnikov, Evie Lacks, MD  buPROPion (WELLBUTRIN XL) 300 MG 24 hr tablet Take 1 tablet (300 mg total) by mouth daily. 12/22/14   Plotnikov, Evie Lacks, MD  Cholecalciferol (VITAMIN D PO) Take 1 tablet by mouth daily.    [provider]  CONCERTA 36 MG CR tablet Take 36 mg by mouth daily.  12/06/14   [provider]  Cranberry 1000  MG CAPS As directed Patient not taking: Reported on 05/11/2018 12/06/15   Plotnikov, Evie Lacks, MD  Cyanocobalamin (VITAMIN B-12 PO) Take 2 tablets by mouth daily.    [provider]  Diclofenac-miSOPROStol 75-0.2 MG TBEC TAKE ONE TABLET TWICE DAILY AS NEEDED 04/30/18   Plotnikov, Evie Lacks, MD  doxycycline (VIBRA-TABS) 100 MG tablet Take 1 tablet (100 mg total) by mouth 2 (two) times daily. 05/11/18   Marrian Salvage, FNP  FLUoxetine (PROZAC) 20 MG capsule Take 3 capsules (60 mg total) by mouth daily. 04/23/17   Plotnikov, Evie Lacks, MD  inFLIXimab in sodium chloride 0.9 % Inject 5 mg/kg into the vein every 3 (three) months. 4 vials of 100mg     [provider]  lamoTRIgine (LAMICTAL) 100 MG tablet Take 1 tablet by mouth 3 (three) times daily. 05/31/15   [provider]  loratadine (CLARITIN) 10 MG tablet Take 10 mg by mouth daily.    [provider]  Melatonin 5 MG TABS Take 10 mg by mouth at bedtime.    [provider]  methocarbamol (ROBAXIN) 500  MG tablet TAKE ONE TABLET EVERY EIGHT HOURS AS NEEDED FOR MUSCLE SPASMS 01/13/18   Plotnikov, Evie Lacks, MD  pantoprazole (PROTONIX) 40 MG tablet TAKE ONE TABLET TWICE DAILY 01/13/18   Plotnikov, Evie Lacks, MD  polymyxin B 500,000 Units in sodium chloride irrigation 0.9 % 500 mL Irrigate with as directed once.    [provider]  Probiotic Product (PROBIOTIC PO) Take 1 capsule by mouth daily.    [provider]  SYNTHROID 50 MCG tablet TAKE 2 TABLETS EVERY MORNING 04/08/18   Plotnikov, Evie Lacks, MD  traMADol (ULTRAM) 50 MG tablet Take 1-2 tablets (50-100 mg total) by mouth every 6 (six) hours as needed for severe pain. 02/10/18   Plotnikov, Evie Lacks, MD  VENTOLIN HFA 108 (90 Base) MCG/ACT inhaler ONE PUFF FOUR TIMES DAILY AS NEEDED 10/12/17   Plotnikov, Evie Lacks, MD  losartan-hydrochlorothiazide (HYZAAR) 50-12.5 MG per tablet Take 1 tablet by mouth daily.  09/04/11  [provider]   simvastatin (ZOCOR) 20 MG tablet Take 20 mg by mouth every evening.  09/04/11  [provider]    Family History Family History  Problem Relation Age of Onset  . Hypertension Father   . Colon cancer Father 3  . Arthritis Father   . Heart disease Father        CHF  . Heart disease Mother        Died, 2  . Healthy Brother   . Healthy Daughter   . Diabetes Neg Hx   . Breast cancer Neg Hx     Social History Social History   Tobacco Use  . Smoking status: Former Smoker    Last attempt to quit: 10/30/1978    Years since quitting: 39.6  . Smokeless tobacco: Never Used  Substance Use Topics  . Alcohol use: Yes    Alcohol/week: 1.0 standard drinks    Types: 1 Standard drinks or equivalent per week    Comment: once a week  . Drug use: No     Allergies   Ampicillin; Erythromycin; Hctz [hydrochlorothiazide]; Metoprolol; Molds & smuts; Morphine and related; and Penicillin g   Review of Systems Review of Systems  Constitutional: Negative for fever.  HENT: Negative for sore throat.   Eyes: Positive for visual disturbance.  Respiratory: Negative for cough and shortness of breath.   Cardiovascular: Negative for chest pain.  Gastrointestinal: Positive for nausea and vomiting. Negative for abdominal pain.  Genitourinary: Negative for difficulty urinating.  Musculoskeletal: Positive for arthralgias (chronic right hip pain) and gait problem. Negative for back pain and neck pain.  Skin: Negative for rash.  Neurological: Positive for dizziness. Negative for syncope, speech difficulty, weakness, numbness and headaches.  Psychiatric/Behavioral: Positive for confusion.     Physical Exam Updated Vital Signs BP (!) 154/83   Pulse 68   Temp 98 F (36.7 C) (Oral)   Resp 16   Ht 5\' 5"  (1.651 m)   Wt 70.8 kg   SpO2 99%   BMI 25.96 kg/m   Physical Exam Vitals signs and nursing note reviewed.  Constitutional:      General: She is not in acute distress.    Appearance:  She is well-developed. She is not diaphoretic.  HENT:     Head: Normocephalic and atraumatic.  Eyes:     General: No visual field deficit.    Conjunctiva/sclera: Conjunctivae normal.  Neck:     Musculoskeletal: Normal range of motion.  Cardiovascular:     Rate and Rhythm: Normal rate  and regular rhythm.     Heart sounds: Normal heart sounds. No murmur. No friction rub. No gallop.   Pulmonary:     Effort: Pulmonary effort is normal. No respiratory distress.     Breath sounds: Normal breath sounds. No wheezing or rales.  Abdominal:     General: There is no distension.     Palpations: Abdomen is soft.     Tenderness: There is no abdominal tenderness. There is no guarding.  Musculoskeletal:        General: No tenderness.  Skin:    General: Skin is warm and dry.     Findings: No erythema or rash.  Neurological:     Mental Status: She is alert.     GCS: GCS eye subscore is 4. GCS verbal subscore is 5. GCS motor subscore is 6.     Cranial Nerves: Cranial nerves are intact. No dysarthria or facial asymmetry.     Sensory: Sensation is intact.     Motor: Motor function is intact.     Coordination: Coordination abnormal. Heel to [facility] Test abnormal (difficulty with heel to shin on left leg, right side with difficulty as well, also reports riight hip pain limiting movement).     Gait: Gait abnormal.     Comments: Reports 2018 or 2019       ED Treatments / Results  Labs (all labs ordered are listed, but only abnormal results are displayed) Labs Reviewed  COMPREHENSIVE METABOLIC PANEL - Abnormal; Notable for the following components:      Result Value   Glucose, Bld 104 (*)    GFR calc non Af Amer 56 (*)    All other components within normal limits  I-STAT CHEM 8, ED - Abnormal; Notable for the following components:   Calcium, Ion 1.08 (*)    All other components within normal limits  ETHANOL  PROTIME-INR  APTT  CBC  DIFFERENTIAL  RAPID URINE DRUG SCREEN, HOSP PERFORMED    URINALYSIS, ROUTINE W REFLEX MICROSCOPIC  I-STAT TROPONIN, ED    EKG EKG Interpretation  Date/Time:  Thursday June 04 2018 17:16:12 EST Ventricular Rate:  67 PR Interval:    QRS Duration: 91 QT Interval:  466 QTC Calculation: 492 R Axis:   -19 Text Interpretation:  Sinus rhythm Borderline left axis deviation Abnormal R-wave progression, early transition Borderline prolonged QT interval No significant change since last tracing Confirmed by Gareth Morgan [phone]) on 06/04/2018 5:19:37 PM   Radiology No results found.  Procedures Procedures (including critical care time)  Medications Ordered in ED Medications  iopamidol (ISOVUE-370) 76 % injection 75 mL (75 mLs Intravenous Contrast Given 06/04/18 1907)     Initial Impression / Assessment and Plan / ED Course  I have reviewed the triage vital signs and the nursing notes.  Pertinent labs & imaging results that were available during my care of the patient were reviewed by me and considered in my medical decision making (see chart for details).    72 year old female with a history of ulcerative colitis, hypertension, asthma, depression who presents with concern for confusion, dizziness, intermittent double vision and difficulty walking.  Differential diagnosis includes CVA, or toxic metabolic encephalopathy.  Labs show no significant abnormalities. Concern for possible posterior circulation stroke. Discussed with Dr. Cheral Marker, who recommends consultation if MRI does show abnormalities.    Given patient's significant change from baseline, will plan on admission for encephalopathy, and further evaluation with MRI.  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  CT shows no acute abnormalities.  MRI shows no signs of CVA.  Urinalysis pending at this time, possible etiologies of encephalopathy included infectious from urinary  tract infection, or polypharmacy, with patient using CBD oil and negative for THC, Xanax, and tramadol.  Final Clinical Impressions(s) / ED Diagnoses   Final diagnoses:  Altered mental status, unspecified altered mental status type  Ataxia    ED Discharge Orders    None       Gareth Morgan, MD 06/05/18 [phone]

## 2018-06-04 NOTE — ED Notes (Signed)
Patient transported to MRI 

## 2018-06-05 ENCOUNTER — Encounter (HOSPITAL_COMMUNITY): Payer: Self-pay | Admitting: *Deleted

## 2018-06-05 DIAGNOSIS — Z800 Family history of malignant neoplasm of digestive organs: Secondary | ICD-10-CM | POA: Diagnosis not present

## 2018-06-05 DIAGNOSIS — Z880 Allergy status to penicillin: Secondary | ICD-10-CM | POA: Diagnosis not present

## 2018-06-05 DIAGNOSIS — F329 Major depressive disorder, single episode, unspecified: Secondary | ICD-10-CM | POA: Diagnosis present

## 2018-06-05 DIAGNOSIS — N390 Urinary tract infection, site not specified: Secondary | ICD-10-CM | POA: Diagnosis present

## 2018-06-05 DIAGNOSIS — R110 Nausea: Secondary | ICD-10-CM | POA: Diagnosis not present

## 2018-06-05 DIAGNOSIS — Z888 Allergy status to other drugs, medicaments and biological substances status: Secondary | ICD-10-CM | POA: Diagnosis not present

## 2018-06-05 DIAGNOSIS — R270 Ataxia, unspecified: Secondary | ICD-10-CM | POA: Diagnosis not present

## 2018-06-05 DIAGNOSIS — G9340 Encephalopathy, unspecified: Secondary | ICD-10-CM | POA: Diagnosis not present

## 2018-06-05 DIAGNOSIS — Z9049 Acquired absence of other specified parts of digestive tract: Secondary | ICD-10-CM | POA: Diagnosis not present

## 2018-06-05 DIAGNOSIS — R4182 Altered mental status, unspecified: Secondary | ICD-10-CM | POA: Diagnosis not present

## 2018-06-05 DIAGNOSIS — Z8249 Family history of ischemic heart disease and other diseases of the circulatory system: Secondary | ICD-10-CM | POA: Diagnosis not present

## 2018-06-05 DIAGNOSIS — W19XXXA Unspecified fall, initial encounter: Secondary | ICD-10-CM | POA: Diagnosis present

## 2018-06-05 DIAGNOSIS — Z79899 Other long term (current) drug therapy: Secondary | ICD-10-CM | POA: Diagnosis not present

## 2018-06-05 DIAGNOSIS — G9341 Metabolic encephalopathy: Secondary | ICD-10-CM | POA: Diagnosis present

## 2018-06-05 DIAGNOSIS — Z79890 Hormone replacement therapy (postmenopausal): Secondary | ICD-10-CM | POA: Diagnosis not present

## 2018-06-05 DIAGNOSIS — Z87891 Personal history of nicotine dependence: Secondary | ICD-10-CM | POA: Diagnosis not present

## 2018-06-05 DIAGNOSIS — Z885 Allergy status to narcotic agent status: Secondary | ICD-10-CM | POA: Diagnosis not present

## 2018-06-05 DIAGNOSIS — Z8261 Family history of arthritis: Secondary | ICD-10-CM | POA: Diagnosis not present

## 2018-06-05 DIAGNOSIS — Z981 Arthrodesis status: Secondary | ICD-10-CM | POA: Diagnosis not present

## 2018-06-05 DIAGNOSIS — J45909 Unspecified asthma, uncomplicated: Secondary | ICD-10-CM | POA: Diagnosis present

## 2018-06-05 DIAGNOSIS — I10 Essential (primary) hypertension: Secondary | ICD-10-CM | POA: Diagnosis present

## 2018-06-05 DIAGNOSIS — Z79891 Long term (current) use of opiate analgesic: Secondary | ICD-10-CM | POA: Diagnosis not present

## 2018-06-05 LAB — CBC
HCT: 37.3 % (ref 36.0–46.0)
Hemoglobin: 12 g/dL (ref 12.0–15.0)
MCH: 29.6 pg (ref 26.0–34.0)
MCHC: 32.2 g/dL (ref 30.0–36.0)
MCV: 91.9 fL (ref 80.0–100.0)
Platelets: 226 10*3/uL (ref 150–400)
RBC: 4.06 MIL/uL (ref 3.87–5.11)
RDW: 14.5 % (ref 11.5–15.5)
WBC: 8.2 10*3/uL (ref 4.0–10.5)
nRBC: 0 % (ref 0.0–0.2)

## 2018-06-05 LAB — COMPREHENSIVE METABOLIC PANEL
ALT: 20 U/L (ref 0–44)
AST: 17 U/L (ref 15–41)
Albumin: 3.5 g/dL (ref 3.5–5.0)
Alkaline Phosphatase: 68 U/L (ref 38–126)
Anion gap: 11 (ref 5–15)
BUN: 12 mg/dL (ref 8–23)
CO2: 25 mmol/L (ref 22–32)
Calcium: 9.5 mg/dL (ref 8.9–10.3)
Chloride: 104 mmol/L (ref 98–111)
Creatinine, Ser: 0.84 mg/dL (ref 0.44–1.00)
GFR calc non Af Amer: >60 mL/min (ref >60)
Glucose, Bld: 85 mg/dL (ref 70–99)
Potassium: 4 mmol/L (ref 3.5–5.1)
SODIUM: 140 mmol/L (ref 135–145)
Total Bilirubin: 0.4 mg/dL (ref 0.3–1.2)
Total Protein: 6.2 g/dL — ABNORMAL LOW (ref 6.5–8.1)

## 2018-06-05 MED ORDER — ALUM & MAG HYDROXIDE-SIMETH 200-200-20 MG/5ML PO SUSP
30.0000 mL | Freq: Once | ORAL | Status: AC
  Administered 2018-06-05: 30 mL via ORAL
  Filled 2018-06-05: qty 30

## 2018-06-05 MED ORDER — SODIUM CHLORIDE 0.9 % IV SOLN
1.0000 g | INTRAVENOUS | Status: DC
  Administered 2018-06-05: 1 g via INTRAVENOUS
  Filled 2018-06-05 (×2): qty 10

## 2018-06-05 MED ORDER — BACID PO TABS
2.0000 | ORAL_TABLET | Freq: Three times a day (TID) | ORAL | Status: DC
  Administered 2018-06-05 – 2018-06-06 (×3): 2 via ORAL
  Filled 2018-06-05 (×4): qty 2

## 2018-06-05 NOTE — Progress Notes (Addendum)
PROGRESS NOTE  [name] NLG:[id number] DOB: [birth year] DOA: 06/04/2018 PCP: Cassandria Anger, MD   LOS: 0 days   Brief narrative:  [name] is a 71 y.o. female with past medical history significant for ADD, back pain, UC not on any med at the moment, HTN, depression presented to the hospital with confusion.  Patient stated that she also had difficulty walking and unsteadiness.  Patient was then admitted to the hospital for acute encephalopathy likely polypharmacy/UTI.  Assessment/Plan:  Principal Problem:   Acute encephalopathy Active Problems:   Essential hypertension   Nausea without vomiting  Acute metabolic encephalopathy likely from polypharmacy and UTI.  Currently improved.  Patient has improved after holding few medications.  She stated that she has been on these medications for several years now.  Discussed about Xanax and muscle relaxants with its side effects.  MRI of the brain was negative.  CT head was negative for acute findings as well.  Ammonia and TSH within normal limits.  No leukocytosis or fever noted.  Patient is on multiple medications including Prozac Wellbutrin Lamictal, methylphenidate  UTI.  Possibly contributing to metabolic encephalopathy.  Patient complained of burning micturition today, had confusion and abnormal urinalysis.  Will empirically start the patient on Rocephin today.  Follow urine cultures.  Hypertension.  Will closely monitor.  On amlodipine.  Nausea and vomiting.  Improved at this time.  Gait difficulty, unsteadiness.  Will get physical therapy evaluation.  VTE Prophylaxis: Lovenox  Code Status:  Full code  Family Communication: No family member at bedside.  Disposition Plan: Home likely in 1 to 2 days.  Will change the  patient's status to inpatient at this time.   Consultants:  None  Procedures:  None  Antibiotics:  Rocephin 06/05/2018  Subjective: Feels much better today.  She is not confused anymore.   Denies overt pain.  Feels unsteady  Objective: Vitals:   06/05/18 0830 06/05/18 1151  BP: 134/68 140/78  Pulse: 67 65  Resp: 18 19  Temp: 97.8 F (36.6 C) 97.7 F (36.5 C)  SpO2: 96% 98%   No intake or output data in the 24 hours ending 06/05/18 1449 Filed Weights   06/04/18 1719  Weight: 70.8 kg   Physical Examination: General exam: Appears calm and comfortable ,Not in distress HEENT:PERRL,Oral mucosa moist Respiratory system: Bilateral equal air entry, normal vesicular breath sounds, no wheezes or crackles  Cardiovascular system: S1 & S2 heard, RRR.  Gastrointestinal system: Abdomen is nondistended, soft and nontender. No organomegaly or masses felt. Normal bowel sounds heard. Central nervous system: Alert and oriented. No focal neurological deficits. Extremities: No edema, no clubbing ,no cyanosis, distal peripheral pulses palpable. Skin: No rashes, lesions or ulcers,no icterus ,no pallor MSK: Normal muscle bulk,tone ,power   Data Review: I have personally reviewed the following laboratory data and studies,  CBC: Recent Labs  Lab 06/04/18 1751 06/04/18 1806 06/05/18 0555  WBC 7.5  --  8.2  NEUTROABS 5.3  --   --   HGB 12.7 13.9 12.0  HCT 39.1 41.0 37.3  MCV 96.5  --  91.9  PLT 249  --  081   Basic Metabolic Panel: Recent Labs  Lab 06/04/18 1751 06/04/18 1806 06/05/18 0555  NA 138 140 140  K 4.3 4.2 4.0  CL 104 105 104  CO2 23  --  25  GLUCOSE 104* 98 85  BUN 22 23 12   CREATININE 1.00 1.00 0.84  CALCIUM 9.4  --  9.5  Liver Function Tests: Recent Labs  Lab 06/04/18 1751 06/05/18 0555  AST 19 17  ALT 22 20  ALKPHOS 77 68  BILITOT 0.6 0.4  PROT 6.9 6.2*  ALBUMIN 4.0 3.5   No results for input(s): LIPASE, AMYLASE in the last 168 hours. Recent Labs  Lab 06/04/18 2248  AMMONIA <9*   Cardiac Enzymes: No results for input(s): CKTOTAL, CKMB, CKMBINDEX, TROPONINI in the last 168 hours. BNP (last 3 results) No results for input(s): BNP in the  last 8760 hours.  ProBNP (last 3 results) No results for input(s): PROBNP in the last 8760 hours.  CBG: No results for input(s): GLUCAP in the last 168 hours. Recent Results (from the past 240 hour(s))  Culture, blood (routine x 2)     Status: None (Preliminary result)   Collection Time: 06/04/18 10:30 PM  Result Value Ref Range Status   Specimen Description BLOOD RIGHT ARM  Final   Special Requests   Final    BOTTLES DRAWN AEROBIC AND ANAEROBIC Blood Culture results may not be optimal due to an excessive volume of blood received in culture bottles   Culture   Final    NO GROWTH < 12 HOURS Performed at [facility] [address], [place], [place] [address]    Report Status PENDING  Incomplete  Culture, blood (routine x 2)     Status: None (Preliminary result)   Collection Time: 06/04/18 10:45 PM  Result Value Ref Range Status   Specimen Description BLOOD RIGHT HAND  Final   Special Requests   Final    BOTTLES DRAWN AEROBIC ONLY Blood Culture results may not be optimal due to an excessive volume of blood received in culture bottles   Culture   Final    NO GROWTH < 12 HOURS Performed at [facility], [place] [address] [place], [place] [address]    Report Status PENDING  Incomplete     Studies: Ct Angio Head W Or Wo Contrast  Result Date: 06/04/2018 CLINICAL DATA:  Vertigo and multiple recent falls EXAM: CT ANGIOGRAPHY HEAD AND NECK TECHNIQUE: Multidetector CT imaging of the head and neck was performed using the standard protocol during bolus administration of intravenous contrast. Multiplanar CT image reconstructions and MIPs were obtained to evaluate the vascular anatomy. Carotid stenosis measurements (when applicable) are obtained utilizing NASCET criteria, using the distal internal carotid diameter as the denominator. CONTRAST:  75 mL ISOVUE-370 IOPAMIDOL (ISOVUE-370) INJECTION 76% COMPARISON:  None. FINDINGS: CT HEAD FINDINGS Brain: There is no mass, hemorrhage or  extra-axial collection. The size and configuration of the ventricles and extra-axial CSF spaces are normal. There is hypoattenuation of the periventricular white matter, most commonly indicating chronic ischemic microangiopathy. Skull: The visualized skull base, calvarium and extracranial soft tissues are normal. Sinuses/Orbits: No fluid levels or advanced mucosal thickening of the visualized paranasal sinuses. No mastoid or middle ear effusion. Air-fluid levels of the maxillary and sphenoid sinuses. CTA NECK FINDINGS SKELETON: There is no bony spinal canal stenosis. No lytic or blastic lesion. OTHER NECK: Normal pharynx, larynx and major salivary glands. No cervical lymphadenopathy. Unremarkable thyroid gland. UPPER CHEST: No pneumothorax or pleural effusion. No nodules or masses. AORTIC ARCH: There is mild calcific atherosclerosis of the aortic arch. There is no aneurysm, dissection or hemodynamically significant stenosis of the visualized ascending aorta and aortic arch. Conventional 3 vessel aortic branching pattern. The visualized proximal subclavian arteries are widely patent. RIGHT CAROTID SYSTEM: --Common carotid artery: Widely patent origin without common  carotid artery dissection or aneurysm. --Internal carotid artery: No dissection, occlusion or aneurysm. There is mixed density atherosclerosis extending into the proximal ICA, resulting in stenosis. --External carotid artery: No acute abnormality. LEFT CAROTID SYSTEM: --Common carotid artery: Widely patent origin without common carotid artery dissection or aneurysm. --Internal carotid artery: No dissection, occlusion or aneurysm. There is predominantly calcified atherosclerosis extending into the proximal ICA, resulting in less than 50% stenosis. --External carotid artery: No acute abnormality. VERTEBRAL ARTERIES: Left dominant configuration. Both origins are normal. No dissection, occlusion or flow-limiting stenosis to the vertebrobasilar confluence. CTA  HEAD FINDINGS ANTERIOR CIRCULATION: --Intracranial internal carotid arteries: Atherosclerotic calcification of the internal carotid arteries at the skull base without hemodynamically significant stenosis. --Anterior cerebral arteries: Normal. Both A1 segments are present. Patent anterior communicating artery. --Middle cerebral arteries: Normal. --Posterior communicating arteries: Present on the left, absent on the right. POSTERIOR CIRCULATION: --Basilar artery: Normal. --Posterior cerebral arteries: Normal. --Superior cerebellar arteries: Normal. --Inferior cerebellar arteries: Normal anterior and posterior inferior cerebellar arteries. VENOUS SINUSES: As permitted by contrast timing, patent. ANATOMIC VARIANTS: None DELAYED PHASE: No parenchymal contrast enhancement. Review of the MIP images confirms the above findings. IMPRESSION: 1. No emergent large vessel occlusion or high-grade stenosis of the intracranial arteries. 2. Bilateral carotid bifurcation atherosclerosis with less than 50% stenosis of the proximal internal carotid arteries. 3. Air-fluid levels in the maxillary and sphenoid sinuses, which may indicate acute sinusitis in the appropriate clinical context. 4.  Aortic atherosclerosis (ICD10-I70.0). Electronically Signed   By: Ulyses Jarred M.D.   On: 06/04/2018 19:55   Ct Angio Neck W And/or Wo Contrast  Result Date: 06/04/2018 CLINICAL DATA:  Vertigo and multiple recent falls EXAM: CT ANGIOGRAPHY HEAD AND NECK TECHNIQUE: Multidetector CT imaging of the head and neck was performed using the standard protocol during bolus administration of intravenous contrast. Multiplanar CT image reconstructions and MIPs were obtained to evaluate the vascular anatomy. Carotid stenosis measurements (when applicable) are obtained utilizing NASCET criteria, using the distal internal carotid diameter as the denominator. CONTRAST:  75 mL ISOVUE-370 IOPAMIDOL (ISOVUE-370) INJECTION 76% COMPARISON:  None. FINDINGS: CT HEAD  FINDINGS Brain: There is no mass, hemorrhage or extra-axial collection. The size and configuration of the ventricles and extra-axial CSF spaces are normal. There is hypoattenuation of the periventricular white matter, most commonly indicating chronic ischemic microangiopathy. Skull: The visualized skull base, calvarium and extracranial soft tissues are normal. Sinuses/Orbits: No fluid levels or advanced mucosal thickening of the visualized paranasal sinuses. No mastoid or middle ear effusion. Air-fluid levels of the maxillary and sphenoid sinuses. CTA NECK FINDINGS SKELETON: There is no bony spinal canal stenosis. No lytic or blastic lesion. OTHER NECK: Normal pharynx, larynx and major salivary glands. No cervical lymphadenopathy. Unremarkable thyroid gland. UPPER CHEST: No pneumothorax or pleural effusion. No nodules or masses. AORTIC ARCH: There is mild calcific atherosclerosis of the aortic arch. There is no aneurysm, dissection or hemodynamically significant stenosis of the visualized ascending aorta and aortic arch. Conventional 3 vessel aortic branching pattern. The visualized proximal subclavian arteries are widely patent. RIGHT CAROTID SYSTEM: --Common carotid artery: Widely patent origin without common carotid artery dissection or aneurysm. --Internal carotid artery: No dissection, occlusion or aneurysm. There is mixed density atherosclerosis extending into the proximal ICA, resulting in stenosis. --External carotid artery: No acute abnormality. LEFT CAROTID SYSTEM: --Common carotid artery: Widely patent origin without common carotid artery dissection or aneurysm. --Internal carotid artery: No dissection, occlusion or aneurysm. There is predominantly calcified atherosclerosis extending into the proximal ICA,  resulting in less than 50% stenosis. --External carotid artery: No acute abnormality. VERTEBRAL ARTERIES: Left dominant configuration. Both origins are normal. No dissection, occlusion or flow-limiting  stenosis to the vertebrobasilar confluence. CTA HEAD FINDINGS ANTERIOR CIRCULATION: --Intracranial internal carotid arteries: Atherosclerotic calcification of the internal carotid arteries at the skull base without hemodynamically significant stenosis. --Anterior cerebral arteries: Normal. Both A1 segments are present. Patent anterior communicating artery. --Middle cerebral arteries: Normal. --Posterior communicating arteries: Present on the left, absent on the right. POSTERIOR CIRCULATION: --Basilar artery: Normal. --Posterior cerebral arteries: Normal. --Superior cerebellar arteries: Normal. --Inferior cerebellar arteries: Normal anterior and posterior inferior cerebellar arteries. VENOUS SINUSES: As permitted by contrast timing, patent. ANATOMIC VARIANTS: None DELAYED PHASE: No parenchymal contrast enhancement. Review of the MIP images confirms the above findings. IMPRESSION: 1. No emergent large vessel occlusion or high-grade stenosis of the intracranial arteries. 2. Bilateral carotid bifurcation atherosclerosis with less than 50% stenosis of the proximal internal carotid arteries. 3. Air-fluid levels in the maxillary and sphenoid sinuses, which may indicate acute sinusitis in the appropriate clinical context. 4.  Aortic atherosclerosis (ICD10-I70.0). Electronically Signed   By: Ulyses Jarred M.D.   On: 06/04/2018 19:55   Mr Brain Wo Contrast  Result Date: 06/04/2018 CLINICAL DATA:  Multiple recent falls. Dizziness. Double vision and difficulty walking. EXAM: MRI HEAD WITHOUT CONTRAST TECHNIQUE: Multiplanar, multiecho pulse sequences of the brain and surrounding structures were obtained without intravenous contrast. COMPARISON:  CTA head and neck 06/04/2018 Brain MRI 08/01/2015 FINDINGS: BRAIN: There is no acute infarct, acute hemorrhage, hydrocephalus or extra-axial collection. The midline structures are normal. No midline shift or other mass effect. There are no old infarcts. Early confluent hyperintense  T2-weighted signal of the periventricular and deep white matter, most commonly due to chronic ischemic microangiopathy. The cerebral and cerebellar volume are age-appropriate. Susceptibility-sensitive sequences show no chronic microhemorrhage or superficial siderosis. VASCULAR: Major intracranial arterial and venous sinus flow voids are normal. SKULL AND UPPER CERVICAL SPINE: Calvarial bone marrow signal is normal. There is no skull base mass. Visualized upper cervical spine and soft tissues are normal. SINUSES/ORBITS: Bilateral sphenoid and maxillary sinus fluid levels. Left mastoid effusion. The orbits are normal. IMPRESSION: 1. No acute intracranial abnormality. 2. Findings of chronic ischemic microangiopathy. 3. Fluid levels within the maxillary and sphenoid sinuses, compatible with acute sinusitis in the appropriate context. Small left mastoid effusion. Electronically Signed   By: Ulyses Jarred M.D.   On: 06/04/2018 22:01    Scheduled Meds: . amLODipine  5 mg Oral Daily   And  . irbesartan  150 mg Oral Daily  . buPROPion  300 mg Oral Daily  . enoxaparin (LOVENOX) injection  40 mg Subcutaneous Q24H  . FLUoxetine  60 mg Oral Daily  . lactobacillus acidophilus  2 tablet Oral TID  . lamoTRIgine  100 mg Oral TID  . levothyroxine  100 mcg Oral Q0600  . methylphenidate  36 mg Oral Daily  . pantoprazole  40 mg Oral BID   Continuous Infusions: . cefTRIAXone (ROCEPHIN)  IV 1 g (06/05/18 1424)   Zi Sek  [facility] Pager [phone]  If 7PM-7AM, please contact night-coverage at [email or web address], password [facility] 06/05/2018, 2:49 PM

## 2018-06-05 NOTE — Evaluation (Signed)
Physical Therapy Evaluation Patient Details Name: [name] MRN: [id number] DOB: [birth year] Today's Date: 06/05/2018   History of Present Illness  Pt is a 72 y/o female admitted secondary to AMS, dizziness and fall. Thought to have acute encephalopathy, possibly due to polypharmacy vs. UTI. MRI negative for acute abnormality. PMH inclues HTN, ADD, depression, and asthma.   Clinical Impression  Pt admitted secondary to problem above with deficits below. Pt with unsteadiness and decreased safety awareness. Required min to min guard A for mobility using RW. Pt reports some double vision as well, so will need more in depth visual assessment. Pt very adamant about returning home at d/c and educated daughter about increased need for assist. Will continue to follow acutely to maximize functional mobility independence and safety.     Follow Up Recommendations Home health PT;Supervision for mobility/OOB    Equipment Recommendations  Rolling walker with 5" wheels    Recommendations for Other Services OT consult     Precautions / Restrictions Precautions Precautions: Fall Restrictions Weight Bearing Restrictions: No      Mobility  Bed Mobility Overal bed mobility: Needs Assistance Bed Mobility: Supine to Sit;Sit to Supine     Supine to sit: Supervision Sit to supine: Supervision   General bed mobility comments: Supervision for safety. Mild dizziness reported, however, resolved with seated rest.   Transfers Overall transfer level: Needs assistance Equipment used: Rolling walker (2 wheeled) Transfers: Sit to/from Stand Sit to Stand: Min guard         General transfer comment: Min guard for steadying assist. Cues for safe hand placement.   Ambulation/Gait Ambulation/Gait assistance: Min assist;Min guard Gait Distance (Feet): 100 Feet Assistive device: Rolling walker (2 wheeled) Gait Pattern/deviations: Step-through pattern;Decreased stride length;Trunk flexed Gait  velocity: Decreased   General Gait Details: Slow, unsteady gait. Noted some shakiness and required min to min guard A for steadying assist. Required cues for safe use of RW as well. Pt reports she was seeing "1.5 of people" when looking straight ahead.   Stairs            Wheelchair Mobility    Modified Rankin (Stroke Patients Only)       Balance Overall balance assessment: Needs assistance Sitting-balance support: No upper extremity supported;Feet supported Sitting balance-Leahy Scale: Good     Standing balance support: Bilateral upper extremity supported;During functional activity Standing balance-Leahy Scale: Poor Standing balance comment: reliant on BUE support                              Pertinent Vitals/Pain Pain Assessment: Faces Faces Pain Scale: Hurts a little bit Pain Location: headache  Pain Descriptors / Indicators: Headache Pain Intervention(s): Limited activity within patient's tolerance;Monitored during session;Repositioned    Home Living Family/patient expects to be discharged to:: Private residence Living Arrangements: Spouse/significant other Available Help at Discharge: Family;Available 24 hours/day Type of Home: House Home Access: Stairs to enter Entrance Stairs-Rails: None Entrance Stairs-Number of Steps: 2 Home Layout: One level Home Equipment: Walker - 4 wheels;Cane - single point Additional Comments: Husband is present with pt 24/7, however, has been recently diagnosed with ALS. Daughter checks in frequently on pt.     Prior Function Level of Independence: Independent with assistive device(s)         Comments: Uses cane for ambulation normally.      Hand Dominance        Extremity/Trunk Assessment   Upper Extremity Assessment Upper  Extremity Assessment: Defer to OT evaluation    Lower Extremity Assessment Lower Extremity Assessment: Generalized weakness;LLE deficits/detail LLE Deficits / Details: Reports hip  pain at baseline. Reports she is scheduled for a hip replacement.     Cervical / Trunk Assessment Cervical / Trunk Assessment: Normal  Communication   Communication: No difficulties  Cognition Arousal/Alertness: Awake/alert Behavior During Therapy: WFL for tasks assessed/performed Overall Cognitive Status: Impaired/Different from baseline Area of Impairment: Memory;Safety/judgement;Problem solving                     Memory: Decreased short-term memory   Safety/Judgement: Decreased awareness of deficits;Decreased awareness of safety   Problem Solving: Slow processing;Difficulty sequencing;Requires verbal cues General Comments: Pt with notable short term memory deficits and some confusion with recalling events. Pt slow to process commands as well.       General Comments General comments (skin integrity, edema, etc.): Tested horizontal and vertical gaze, however, did not note any nystagmus. Pt did reports some double vision, so will need in depth visual assessment. Daughter present during session. Educated about need for increased support at d/c. Pt very adamant about going home.     Exercises     Assessment/Plan    PT Assessment Patient needs continued PT services  PT Problem List Decreased strength;Decreased balance;Decreased mobility;Decreased knowledge of use of DME;Decreased knowledge of precautions;Decreased safety awareness;Decreased cognition       PT Treatment Interventions DME instruction;Gait training;Functional mobility training;Stair training;Therapeutic activities;Therapeutic exercise;Balance training;Patient/family education    PT Goals (Current goals can be found in the Care Plan section)  Acute Rehab PT Goals Patient Stated Goal: to go home to my husband PT Goal Formulation: With patient Time For Goal Achievement: 06/19/18 Potential to Achieve Goals: Good    Frequency Min 3X/week   Barriers to discharge        Co-evaluation                AM-PAC PT "6 Clicks" Mobility  Outcome Measure Help needed turning from your back to your side while in a flat bed without using bedrails?: None Help needed moving from lying on your back to sitting on the side of a flat bed without using bedrails?: None Help needed moving to and from a bed to a chair (including a wheelchair)?: A Little Help needed standing up from a chair using your arms (e.g., wheelchair or bedside chair)?: A Little Help needed to walk in hospital room?: A Little Help needed climbing 3-5 steps with a railing? : A Lot 6 Click Score: 19    End of Session Equipment Utilized During Treatment: Gait belt Activity Tolerance: Patient tolerated treatment well Patient left: in bed;with call bell/phone within reach;with bed alarm set;with family/visitor present Nurse Communication: Mobility status PT Visit Diagnosis: Unsteadiness on feet (R26.81);Muscle weakness (generalized) (M62.81)    Time: 5956-3875 PT Time Calculation (min) (ACUTE ONLY): 35 min   Charges:   PT Evaluation $PT Eval Moderate Complexity: 1 Mod PT Treatments $Gait Training: 8-22 mins        Leighton Ruff, PT, DPT  Acute Rehabilitation Services  Pager: [phone] Office: [phone]   Rudean Hitt 06/05/2018, 3:28 PM

## 2018-06-06 DIAGNOSIS — G9340 Encephalopathy, unspecified: Secondary | ICD-10-CM

## 2018-06-06 MED ORDER — CEFPODOXIME PROXETIL 100 MG PO TABS: 100.0000 mg | ORAL_TABLET | Freq: Two times a day (BID) | ORAL | 0 refills | Status: AC

## 2018-06-06 NOTE — Discharge Summary (Signed)
Physician Discharge Summary  [name] UXY:[id number] DOB: 04-02-1946 DOA: 06/04/2018  PCP: Cassandria Anger, MD  Admit date: 06/04/2018 Discharge date: 06/06/2018  Admitted From: Home Disposition: Home with home health  Recommendations for Outpatient Follow-up:  1. Follow up with PCP in 1-2 weeks 2. Please obtain BMP/CBC in one week your next doctors visit.  3. Oral Vantin is prescribed for urinary tract infection  Home Health: PT/OT Equipment/Devices: 3 and 1 Discharge Condition: Stable CODE STATUS: Full code Diet recommendation: 2 g salt  Brief/Interim Summary: 72 year old with history of ADD, chronic pain, essential hypertension came to the hospital for evaluation of difficulty walking and unsteadiness.  She was also noted to be slightly confused.  Upon admission CT of the head was negative, MRI brain was negative.  Ammonia and TSH were within normal limits.  She was found to have urinary tract infection therefore started on IV Rocephin which was transitioned to oral Vantin at the time of discharge.  Initially there was concern that her confusion was secondary to UTI and polypharmacy therefore she was advised to space out her home medications. Today she is reached max benefit from hospital stay and stable for discharge.   Discharge Diagnoses:  Principal Problem:   Acute encephalopathy Active Problems:   Essential hypertension   Nausea without vomiting  Acute metabolic encephalopathy, multifactorial Polypharmacy and urinary tract infection -CT of the head and MRI brain negative.  Ammonia and TSH level within normal limits -Patient advised to space out her home medications.  She needs to follow-up with outpatient primary care provider to further reconcile her medications if possible - We will transition IV Rocephin to oral Vantin for 4 days.  Culture data not available at the time of discharge  Essential hypertension -Resume home meds  Gait unsteadiness,  improved -PT/OT recommends home health therefore arrangements made.  Patient on Lovenox while she was here Full code Discharged in stable condition  Discharge Instructions   Allergies as of 06/06/2018      Reactions   Ampicillin Nausea And Vomiting   Erythromycin Nausea And Vomiting, Other (See Comments)   Can take Z pac   Hctz [hydrochlorothiazide] Other (See Comments)   dizzy   Metoprolol Other (See Comments)   doublevision    Molds & Smuts Other (See Comments)   Pt coughs and uses an inhaler.   Morphine And Related Nausea And Vomiting   Penicillin G Other (See Comments)   Unknown Has patient had a PCN reaction causing immediate rash, facial/tongue/throat swelling, SOB or lightheadedness with hypotension: Unknown Has patient had a PCN reaction causing severe rash involving mucus membranes or skin necrosis: Unknown Has patient had a PCN reaction that required hospitalization: Unknown Has patient had a PCN reaction occurring within the last 10 years: Unknown If all of the above answers are "NO", then may proceed with Cephalosporin use.      Medication List    TAKE these medications   ALPRAZolam 0.5 MG tablet Commonly known as:  XANAX Take 1 tablet (0.5 mg total) by mouth 2 (two) times daily as needed for anxiety or sleep.   amLODipine-valsartan 5-160 MG tablet Commonly known as:  EXFORGE Take 1 tablet by mouth daily.   buPROPion 300 MG 24 hr tablet Commonly known as:  WELLBUTRIN XL Take 1 tablet (300 mg total) by mouth daily.   cefpodoxime 100 MG tablet Commonly known as:  VANTIN Take 1 tablet (100 mg total) by mouth 2 (two) times daily for 4 days.  CONCERTA 36 MG CR tablet Generic drug:  methylphenidate Take 36 mg by mouth daily.   FLUoxetine 20 MG capsule Commonly known as:  PROZAC Take 3 capsules (60 mg total) by mouth daily.   lamoTRIgine 100 MG tablet Commonly known as:  LAMICTAL Take 100 mg by mouth 3 (three) times daily.   pantoprazole 40 MG  tablet Commonly known as:  PROTONIX TAKE ONE TABLET TWICE DAILY   SYNTHROID 50 MCG tablet Generic drug:  levothyroxine TAKE 2 TABLETS EVERY MORNING What changed:    how much to take  when to take this   traMADol 50 MG tablet Commonly known as:  ULTRAM Take 1-2 tablets (50-100 mg total) by mouth every 6 (six) hours as needed for severe pain.   VENTOLIN HFA 108 (90 Base) MCG/ACT inhaler Generic drug:  albuterol ONE PUFF FOUR TIMES DAILY AS NEEDED What changed:  See the new instructions.   VITAMIN B-12 PO Take 1 tablet by mouth daily.   VITAMIN D PO Take 1 tablet by mouth daily.            Durable Medical Equipment  (From admission, onward)         Start     Ordered   06/06/18 0954  For home use only DME Walker rolling  Once    Question:  Patient needs a walker to treat with the following condition  Answer:  Weakness   06/06/18 0953         Follow-up Information    Plotnikov, Evie Lacks, MD. Schedule an appointment as soon as possible for a visit in 1 week(s).   Specialty:  Internal Medicine Contact information: [address]  [place] [address] [phone]        [facility] Follow [facility].   Specialty:  Home Health Services Why:  For home health PT. They will call you in 1-2 days to set up yoour first home apointment. A rolling walker will also be delivered to your room prior to DC.  Contact information: [place] [address] [phone]          Allergies  Allergen Reactions  . Ampicillin Nausea And Vomiting  . Erythromycin Nausea And Vomiting and Other (See Comments)    Can take Z pac  . Hctz [Hydrochlorothiazide] Other (See Comments)    dizzy  . Metoprolol Other (See Comments)    doublevision   . Molds & Smuts Other (See Comments)    Pt coughs and uses an inhaler.  . Morphine And Related Nausea And Vomiting  . Penicillin G Other (See Comments)    Unknown Has patient had a PCN reaction causing  immediate rash, facial/tongue/throat swelling, SOB or lightheadedness with hypotension: Unknown Has patient had a PCN reaction causing severe rash involving mucus membranes or skin necrosis: Unknown Has patient had a PCN reaction that required hospitalization: Unknown Has patient had a PCN reaction occurring within the last 10 years: Unknown If all of the above answers are "NO", then may proceed with Cephalosporin use.     You were cared for by a hospitalist during your hospital stay. If you have any questions about your discharge medications or the care you received while you were in the hospital after you are discharged, you can call the unit and asked to speak with the hospitalist on call if the hospitalist that took care of you is not available. Once you are discharged, your primary care physician will handle any further medical issues.  Please note that no refills for any discharge medications will be authorized once you are discharged, as it is imperative that you return to your primary care physician (or establish a relationship with a primary care physician if you do not have one) for your aftercare needs so that they can reassess your need for medications and monitor your lab values.  Consultations:  None   Procedures/Studies: Ct Angio Head W Or Wo Contrast  Result Date: 06/04/2018 CLINICAL DATA:  Vertigo and multiple recent falls EXAM: CT ANGIOGRAPHY HEAD AND NECK TECHNIQUE: Multidetector CT imaging of the head and neck was performed using the standard protocol during bolus administration of intravenous contrast. Multiplanar CT image reconstructions and MIPs were obtained to evaluate the vascular anatomy. Carotid stenosis measurements (when applicable) are obtained utilizing NASCET criteria, using the distal internal carotid diameter as the denominator. CONTRAST:  75 mL ISOVUE-370 IOPAMIDOL (ISOVUE-370) INJECTION 76% COMPARISON:  None. FINDINGS: CT HEAD FINDINGS Brain: There is no mass,  hemorrhage or extra-axial collection. The size and configuration of the ventricles and extra-axial CSF spaces are normal. There is hypoattenuation of the periventricular white matter, most commonly indicating chronic ischemic microangiopathy. Skull: The visualized skull base, calvarium and extracranial soft tissues are normal. Sinuses/Orbits: No fluid levels or advanced mucosal thickening of the visualized paranasal sinuses. No mastoid or middle ear effusion. Air-fluid levels of the maxillary and sphenoid sinuses. CTA NECK FINDINGS SKELETON: There is no bony spinal canal stenosis. No lytic or blastic lesion. OTHER NECK: Normal pharynx, larynx and major salivary glands. No cervical lymphadenopathy. Unremarkable thyroid gland. UPPER CHEST: No pneumothorax or pleural effusion. No nodules or masses. AORTIC ARCH: There is mild calcific atherosclerosis of the aortic arch. There is no aneurysm, dissection or hemodynamically significant stenosis of the visualized ascending aorta and aortic arch. Conventional 3 vessel aortic branching pattern. The visualized proximal subclavian arteries are widely patent. RIGHT CAROTID SYSTEM: --Common carotid artery: Widely patent origin without common carotid artery dissection or aneurysm. --Internal carotid artery: No dissection, occlusion or aneurysm. There is mixed density atherosclerosis extending into the proximal ICA, resulting in stenosis. --External carotid artery: No acute abnormality. LEFT CAROTID SYSTEM: --Common carotid artery: Widely patent origin without common carotid artery dissection or aneurysm. --Internal carotid artery: No dissection, occlusion or aneurysm. There is predominantly calcified atherosclerosis extending into the proximal ICA, resulting in less than 50% stenosis. --External carotid artery: No acute abnormality. VERTEBRAL ARTERIES: Left dominant configuration. Both origins are normal. No dissection, occlusion or flow-limiting stenosis to the vertebrobasilar  confluence. CTA HEAD FINDINGS ANTERIOR CIRCULATION: --Intracranial internal carotid arteries: Atherosclerotic calcification of the internal carotid arteries at the skull base without hemodynamically significant stenosis. --Anterior cerebral arteries: Normal. Both A1 segments are present. Patent anterior communicating artery. --Middle cerebral arteries: Normal. --Posterior communicating arteries: Present on the left, absent on the right. POSTERIOR CIRCULATION: --Basilar artery: Normal. --Posterior cerebral arteries: Normal. --Superior cerebellar arteries: Normal. --Inferior cerebellar arteries: Normal anterior and posterior inferior cerebellar arteries. VENOUS SINUSES: As permitted by contrast timing, patent. ANATOMIC VARIANTS: None DELAYED PHASE: No parenchymal contrast enhancement. Review of the MIP images confirms the above findings. IMPRESSION: 1. No emergent large vessel occlusion or high-grade stenosis of the intracranial arteries. 2. Bilateral carotid bifurcation atherosclerosis with less than 50% stenosis of the proximal internal carotid arteries. 3. Air-fluid levels in the maxillary and sphenoid sinuses, which may indicate acute sinusitis in the appropriate clinical context. 4.  Aortic atherosclerosis (ICD10-I70.0). Electronically Signed   By: Ulyses Jarred M.D.   On: 06/04/2018 19:55  Dg Chest 2 View  Result Date: 05/12/2018 CLINICAL DATA:  Two days of left posterior chest pain associated with cough, chest congestion, and wheezing. Former smoker. History of asthma. EXAM: CHEST - 2 VIEW COMPARISON:  Portable chest x-ray of April 24, 2014 FINDINGS: The lungs are mildly hyperinflated. The interstitial markings are coarse. There is no alveolar infiltrate for pleural effusion. The heart and pulmonary vascularity are normal. There is calcification in the wall of the aortic arch. Sclerotic foci in the anterolateral aspects of the third, fourth, fifth, and 6 right ribs are consistent with previous  fractures. 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 There is mild degenerative disc disease of the thoracic spine. The patient has undergone PLIF in the upper and mid lumbar spine. IMPRESSION: Chronic bronchitic-reactive airway changes. I cannot exclude superimposed acute bronchitis. No alveolar pneumonia nor CHF. Thoracic aortic atherosclerosis. Multilevel degenerative disc disease of the thoracic spine. Electronically Signed   By: David  Martinique M.D.   On: 05/12/2018 07:57   Ct Angio Neck W And/or Wo Contrast  Result Date: 06/04/2018 CLINICAL DATA:  Vertigo and multiple recent falls EXAM: CT ANGIOGRAPHY HEAD AND NECK TECHNIQUE: Multidetector CT imaging of the head and neck was performed using the standard protocol during bolus administration of intravenous contrast. Multiplanar CT image reconstructions and MIPs were obtained to evaluate the vascular anatomy. Carotid stenosis measurements (when applicable) are obtained utilizing NASCET criteria, using the distal internal carotid diameter as the denominator. CONTRAST:  75 mL ISOVUE-370 IOPAMIDOL (ISOVUE-370) INJECTION 76% COMPARISON:  None. FINDINGS: CT HEAD FINDINGS Brain: There is no mass, hemorrhage or extra-axial collection. The size and configuration of the ventricles and extra-axial CSF spaces are normal. There is hypoattenuation of the periventricular white matter, most commonly indicating chronic ischemic microangiopathy. Skull: The visualized skull base, calvarium and extracranial soft tissues are normal. Sinuses/Orbits: No fluid levels or advanced mucosal thickening of the visualized paranasal sinuses. No mastoid or middle ear effusion. Air-fluid levels of the maxillary and sphenoid sinuses. CTA NECK FINDINGS SKELETON: There is no bony spinal canal stenosis. No lytic or blastic lesion. OTHER NECK: Normal pharynx, larynx and major salivary glands. No cervical lymphadenopathy. Unremarkable thyroid gland. UPPER CHEST: No pneumothorax or pleural  effusion. No nodules or masses. AORTIC ARCH: There is mild calcific atherosclerosis of the aortic arch. There is no aneurysm, dissection or hemodynamically significant stenosis of the visualized ascending aorta and aortic arch. Conventional 3 vessel aortic branching pattern. The visualized proximal subclavian arteries are widely patent. RIGHT CAROTID SYSTEM: --Common carotid artery: Widely patent origin without common carotid artery dissection or aneurysm. --Internal carotid artery: No dissection, occlusion or aneurysm. There is mixed density atherosclerosis extending into the proximal ICA, resulting in stenosis. --External carotid artery: No acute abnormality. LEFT CAROTID SYSTEM: --Common carotid artery: Widely patent origin without common carotid artery dissection or aneurysm. --Internal carotid artery: No dissection, occlusion or aneurysm. There is predominantly calcified atherosclerosis extending into the proximal ICA, resulting in less than 50% stenosis. --External carotid artery: No acute abnormality. VERTEBRAL ARTERIES: Left dominant configuration. Both origins are normal. No dissection, occlusion or flow-limiting stenosis to the vertebrobasilar confluence. CTA HEAD FINDINGS ANTERIOR CIRCULATION: --Intracranial internal carotid arteries: Atherosclerotic calcification of the internal carotid arteries at the skull base without hemodynamically significant stenosis. --Anterior cerebral arteries: Normal. Both A1 segments are present. Patent anterior communicating artery. --Middle cerebral arteries: Normal. --Posterior communicating arteries: Present on the left, absent on the right. POSTERIOR CIRCULATION: --Basilar artery: Normal. --Posterior cerebral arteries: Normal. --Superior cerebellar arteries: Normal. --  Inferior cerebellar arteries: Normal anterior and posterior inferior cerebellar arteries. VENOUS SINUSES: As permitted by contrast timing, patent. ANATOMIC VARIANTS: None DELAYED PHASE: No parenchymal  contrast enhancement. Review of the MIP images confirms the above findings. IMPRESSION: 1. No emergent large vessel occlusion or high-grade stenosis of the intracranial arteries. 2. Bilateral carotid bifurcation atherosclerosis with less than 50% stenosis of the proximal internal carotid arteries. 3. Air-fluid levels in the maxillary and sphenoid sinuses, which may indicate acute sinusitis in the appropriate clinical context. 4.  Aortic atherosclerosis (ICD10-I70.0). Electronically Signed   By: Ulyses Jarred M.D.   On: 06/04/2018 19:55   Mr Brain Wo Contrast  Result Date: 06/04/2018 CLINICAL DATA:  Multiple recent falls. Dizziness. Double vision and difficulty walking. EXAM: MRI HEAD WITHOUT CONTRAST TECHNIQUE: Multiplanar, multiecho pulse sequences of the brain and surrounding structures were obtained without intravenous contrast. COMPARISON:  CTA head and neck 06/04/2018 Brain MRI 08/01/2015 FINDINGS: BRAIN: There is no acute infarct, acute hemorrhage, hydrocephalus or extra-axial collection. The midline structures are normal. No midline shift or other mass effect. There are no old infarcts. Early confluent hyperintense T2-weighted signal of the periventricular and deep white matter, most commonly due to chronic ischemic microangiopathy. The cerebral and cerebellar volume are age-appropriate. Susceptibility-sensitive sequences show no chronic microhemorrhage or superficial siderosis. VASCULAR: Major intracranial arterial and venous sinus flow voids are normal. SKULL AND UPPER CERVICAL SPINE: Calvarial bone marrow signal is normal. There is no skull base mass. Visualized upper cervical spine and soft tissues are normal. SINUSES/ORBITS: Bilateral sphenoid and maxillary sinus fluid levels. Left mastoid effusion. The orbits are normal. IMPRESSION: 1. No acute intracranial abnormality. 2. Findings of chronic ischemic microangiopathy. 3. Fluid levels within the maxillary and sphenoid sinuses, compatible with acute  sinusitis in the appropriate context. Small left mastoid effusion. Electronically Signed   By: Ulyses Jarred M.D.   On: 06/04/2018 22:01      Subjective: Feels better, no complaints.  General = no fevers, chills, dizziness, malaise, fatigue HEENT/EYES = negative for pain, redness, loss of vision, double vision, blurred vision, loss of hearing, sore throat, hoarseness, dysphagia Cardiovascular= negative for chest pain, palpitation, murmurs, lower extremity swelling Respiratory/lungs= negative for shortness of breath, cough, hemoptysis, wheezing, mucus production Gastrointestinal= negative for nausea, vomiting,, abdominal pain, melena, hematemesis Genitourinary= negative for Dysuria, Hematuria, Change in Urinary Frequency MSK = Negative for arthralgia, myalgias, Back Pain, Joint swelling  Neurology= Negative for headache, seizures, numbness, tingling  Psychiatry= Negative for anxiety, depression, suicidal and homocidal ideation Allergy/Immunology= Medication/Food allergy as listed  Skin= Negative for Rash, lesions, ulcers, itching    Discharge Exam: Vitals:   06/06/18 0812 06/06/18 1147  BP: (!) 149/79 (!) 142/86  Pulse: 62 66  Resp: 16 16  Temp: 98.3 F (36.8 C) (!) 97.5 F (36.4 C)  SpO2: 94% 92%   Vitals:   06/06/18 0000 06/06/18 0551 06/06/18 0812 06/06/18 1147  BP: (!) 147/72 (!) 142/85 (!) 149/79 (!) 142/86  Pulse: 69 66 62 66  Resp: 18 18 16 16   Temp: 98 F (36.7 C) 98 F (36.7 C) 98.3 F (36.8 C) (!) 97.5 F (36.4 C)  TempSrc: Oral Oral Oral Oral  SpO2: 96% 95% 94% 92%  Weight:      Height:        General: Pt is alert, awake, not in acute distress Cardiovascular: RRR, S1/S2 +, no rubs, no gallops Respiratory: CTA bilaterally, no wheezing, no rhonchi Abdominal: Soft, NT, ND, bowel sounds + Extremities: no edema, no cyanosis  The results of significant diagnostics from this hospitalization (including imaging, microbiology, ancillary and laboratory) are  listed below for reference.     Microbiology: Recent Results (from the past 240 hour(s))  Culture, blood (routine x 2)     Status: None (Preliminary result)   Collection Time: 06/04/18 10:30 PM  Result Value Ref Range Status   Specimen Description BLOOD RIGHT ARM  Final   Special Requests   Final    BOTTLES DRAWN AEROBIC AND ANAEROBIC Blood Culture results may not be optimal due to an excessive volume of blood received in culture bottles   Culture   Final    NO GROWTH 2 DAYS Performed at [facility] [address] [place], [place] [address]    Report Status PENDING  Incomplete  Culture, blood (routine x 2)     Status: None (Preliminary result)   Collection Time: 06/04/18 10:45 PM  Result Value Ref Range Status   Specimen Description BLOOD RIGHT HAND  Final   Special Requests   Final    BOTTLES DRAWN AEROBIC ONLY Blood Culture results may not be optimal due to an excessive volume of blood received in culture bottles   Culture   Final    NO GROWTH 2 DAYS Performed at [facility], [place] [address] [place], [place] [address]    Report Status PENDING  Incomplete  Culture, Urine     Status: Abnormal (Preliminary result)   Collection Time: 06/05/18  2:38 PM  Result Value Ref Range Status   Specimen Description URINE, CLEAN CATCH  Final   Special Requests   Final    NONE Performed at [facility], [place] [address], [place],  [address]    Culture 10,000 COLONIES/mL GRAM NEGATIVE RODS (A)  Final   Report Status PENDING  Incomplete     Labs: BNP (last 3 results) No results for input(s): BNP in the last 8760 hours. Basic Metabolic Panel: Recent Labs  Lab 06/04/18 1751 06/04/18 1806 06/05/18 0555  NA 138 140 140  K 4.3 4.2 4.0  CL 104 105 104  CO2 23  --  25  GLUCOSE 104* 98 85  BUN 22 23 12   CREATININE 1.00 1.00 0.84  CALCIUM 9.4  --  9.5   Liver Function Tests: Recent Labs  Lab 06/04/18 1751 06/05/18 0555  AST 19 17  ALT 22 20  ALKPHOS 77  68  BILITOT 0.6 0.4  PROT 6.9 6.2*  ALBUMIN 4.0 3.5   No results for input(s): LIPASE, AMYLASE in the last 168 hours. Recent Labs  Lab 06/04/18 2248  AMMONIA <9*   CBC: Recent Labs  Lab 06/04/18 1751 06/04/18 1806 06/05/18 0555  WBC 7.5  --  8.2  NEUTROABS 5.3  --   --   HGB 12.7 13.9 12.0  HCT 39.1 41.0 37.3  MCV 96.5  --  91.9  PLT 249  --  226   Cardiac Enzymes: No results for input(s): CKTOTAL, CKMB, CKMBINDEX, TROPONINI in the last 168 hours. BNP: Invalid input(s): POCBNP CBG: No results for input(s): GLUCAP in the last 168 hours. D-Dimer No results for input(s): DDIMER in the last 72 hours. Hgb A1c No results for input(s): HGBA1C in the last 72 hours. Lipid Profile No results for input(s): CHOL, HDL, LDLCALC, TRIG, CHOLHDL, LDLDIRECT in the last 72 hours. Thyroid function studies Recent Labs    06/04/18 2248  TSH 1.880   Anemia work up No results for input(s): VITAMINB12, FOLATE, FERRITIN,  TIBC, IRON, RETICCTPCT in the last 72 hours. Urinalysis    Component Value Date/Time   COLORURINE YELLOW 06/04/2018 2022   APPEARANCEUR CLEAR 06/04/2018 2022   LABSPEC 1.017 06/04/2018 2022   PHURINE 6.0 06/04/2018 2022   GLUCOSEU NEGATIVE 06/04/2018 2022   GLUCOSEU NEGATIVE 02/10/2018 1356   HGBUR NEGATIVE 06/04/2018 2022   [place] NEGATIVE 06/04/2018 2022   KETONESUR 5 (A) 06/04/2018 2022   PROTEINUR NEGATIVE 06/04/2018 2022   UROBILINOGEN 0.2 02/10/2018 1356   NITRITE NEGATIVE 06/04/2018 2022   LEUKOCYTESUR MODERATE (A) 06/04/2018 2022   Sepsis Labs Invalid input(s): PROCALCITONIN,  WBC,  LACTICIDVEN Microbiology Recent Results (from the past 240 hour(s))  Culture, blood (routine x 2)     Status: None (Preliminary result)   Collection Time: 06/04/18 10:30 PM  Result Value Ref Range Status   Specimen Description BLOOD RIGHT ARM  Final   Special Requests   Final    BOTTLES DRAWN AEROBIC AND ANAEROBIC Blood Culture results may not be optimal due to an  excessive volume of blood received in culture bottles   Culture   Final    NO GROWTH 2 DAYS Performed at [facility], [place] [address] [place], [place] [address]    Report Status PENDING  Incomplete  Culture, blood (routine x 2)     Status: None (Preliminary result)   Collection Time: 06/04/18 10:45 PM  Result Value Ref Range Status   Specimen Description BLOOD RIGHT HAND  Final   Special Requests   Final    BOTTLES DRAWN AEROBIC ONLY Blood Culture results may not be optimal due to an excessive volume of blood received in culture bottles   Culture   Final    NO GROWTH 2 DAYS Performed at [facility], [place] [address] [place], [place] [address]    Report Status PENDING  Incomplete  Culture, Urine     Status: Abnormal (Preliminary result)   Collection Time: 06/05/18  2:38 PM  Result Value Ref Range Status   Specimen Description URINE, CLEAN CATCH  Final   Special Requests   Final    NONE Performed at [facility], [place] [address] [address] [place],  [address]    Culture 10,000 COLONIES/mL GRAM NEGATIVE RODS (A)  Final   Report Status PENDING  Incomplete     Time coordinating discharge:  I have spent 35 minutes face to face with the patient and on the ward discussing the patients care, assessment, plan and disposition with other care givers. >50% of the time was devoted counseling the patient about the risks and benefits of treatment/Discharge disposition and coordinating care.   SIGNED:   Damita Lack, MD  [facility] 06/06/2018, 1:23 PM Pager   If 7PM-7AM, please contact night-coverage [email or web address] Password [email or web address]

## 2018-06-06 NOTE — Progress Notes (Signed)
NURSING PROGRESS NOTE  [name] [id number] Discharge Data: 06/06/2018 6:05 PM Attending Provider: No att. providers found XBL:[id number], Evie Lacks, MD     Scarlette Calico to be D/C'd Home per MD order.  Discussed with the patient the After Visit Summary and all questions fully answered. All IV's discontinued with no bleeding noted. All belongings returned to patient for patient to take home.   Last Vital Signs:  Blood pressure (!) 142/86, pulse 66, temperature (!) 97.5 F (36.4 C), temperature source Oral, resp. rate 16, height 5\' 5"  (1.651 m), weight 70.8 kg, SpO2 92 %.  Discharge Medication List Allergies as of 06/06/2018      Reactions   Ampicillin Nausea And Vomiting   Erythromycin Nausea And Vomiting, Other (See Comments)   Can take Z pac   Hctz [hydrochlorothiazide] Other (See Comments)   dizzy   Metoprolol Other (See Comments)   doublevision    Molds & Smuts Other (See Comments)   Pt coughs and uses an inhaler.   Morphine And Related Nausea And Vomiting   Penicillin G Other (See Comments)   Unknown Has patient had a PCN reaction causing immediate rash, facial/tongue/throat swelling, SOB or lightheadedness with hypotension: Unknown Has patient had a PCN reaction causing severe rash involving mucus membranes or skin necrosis: Unknown Has patient had a PCN reaction that required hospitalization: Unknown Has patient had a PCN reaction occurring within the last 10 years: Unknown If all of the above answers are "NO", then may proceed with Cephalosporin use.      Medication List    TAKE these medications   ALPRAZolam 0.5 MG tablet Commonly known as:  XANAX Take 1 tablet (0.5 mg total) by mouth 2 (two) times daily as needed for anxiety or sleep.   amLODipine-valsartan 5-160 MG tablet Commonly known as:  EXFORGE Take 1 tablet by mouth daily.   buPROPion 300 MG 24 hr tablet Commonly known as:  WELLBUTRIN XL Take 1 tablet (300 mg total) by mouth daily.    cefpodoxime 100 MG tablet Commonly known as:  VANTIN Take 1 tablet (100 mg total) by mouth 2 (two) times daily for 4 days.   CONCERTA 36 MG CR tablet Generic drug:  methylphenidate Take 36 mg by mouth daily.   FLUoxetine 20 MG capsule Commonly known as:  PROZAC Take 3 capsules (60 mg total) by mouth daily.   lamoTRIgine 100 MG tablet Commonly known as:  LAMICTAL Take 100 mg by mouth 3 (three) times daily.   pantoprazole 40 MG tablet Commonly known as:  PROTONIX TAKE ONE TABLET TWICE DAILY   SYNTHROID 50 MCG tablet Generic drug:  levothyroxine TAKE 2 TABLETS EVERY MORNING What changed:    how much to take  when to take this   traMADol 50 MG tablet Commonly known as:  ULTRAM Take 1-2 tablets (50-100 mg total) by mouth every 6 (six) hours as needed for severe pain.   VENTOLIN HFA 108 (90 Base) MCG/ACT inhaler Generic drug:  albuterol ONE PUFF FOUR TIMES DAILY AS NEEDED What changed:  See the new instructions.   VITAMIN B-12 PO Take 1 tablet by mouth daily.   VITAMIN D PO Take 1 tablet by mouth daily.

## 2018-06-06 NOTE — Care Management Note (Signed)
Case Management Note  Patient Details  Name: [name] MRN: [id number] Date of Birth: 1946-03-30  Subjective/Objective:                    Action/Plan: Discussed DC plan w patient. She would like to DC to home w [facility] services. Referral placed to [facility]. She also requested RW as rec by PT. Order placed and it will be delivered to room prior to DC.   Expected Discharge Date:  06/06/18               Expected Discharge Plan:  [place]  In-House Referral:     Discharge planning Services  CM Consult  Post Acute Care Choice:  Home Health Choice offered to:  Patient  DME Arranged:  Walker rolling DME Agency:  [place]:  PT [facility] Agency:  [place]  Status of Service:  Completed, signed off  If discussed at [place] of Stay Meetings, dates discussed:    Additional Comments:  Carles Collet, RN 06/06/2018, 10:02 AM

## 2018-06-06 NOTE — Progress Notes (Signed)
Physical Therapy Treatment Patient Details Name: [name] MRN: [id number] DOB: [birth year] Today's Date: 06/06/2018    History of Present Illness Pt is a 72 y/o female admitted secondary to AMS, dizziness and fall. Thought to have acute encephalopathy, possibly due to polypharmacy. MRI negative for acute abnormality. PMH inclues HTN, ADD, depression, and asthma.     PT Comments    Pt making steady progress, requiring less assistance this session with no visual disturbances noted. Pt would continue to benefit from skilled physical therapy services at this time while admitted and after d/c to address the below listed limitations in order to improve overall safety and independence with functional mobility.    Follow Up Recommendations  Home health PT;Supervision for mobility/OOB     Equipment Recommendations  Rolling walker with 5" wheels    Recommendations for Other Services       Precautions / Restrictions Precautions Precautions: Fall Restrictions Weight Bearing Restrictions: No    Mobility  Bed Mobility Overal bed mobility: Modified Independent             General bed mobility comments: pt sitting EOB upon arrival  Transfers Overall transfer level: Needs assistance Equipment used: Rolling walker (2 wheeled) Transfers: Sit to/from Stand Sit to Stand: Supervision         General transfer comment: supervision for safety  Ambulation/Gait Ambulation/Gait assistance: Min guard Gait Distance (Feet): 100 Feet Assistive device: Rolling walker (2 wheeled) Gait Pattern/deviations: Step-through pattern;Decreased stride length;Trunk flexed Gait velocity: Decreased   General Gait Details: pt with mild instability but no overt LOB or need for physical assistance, close min guard for safety, cueing to maintain a safe distance from [employer] Mobility    Modified Rankin (Stroke Patients Only)       Balance Overall balance  assessment: Needs assistance Sitting-balance support: No upper extremity supported;Feet supported Sitting balance-Leahy Scale: Good     Standing balance support: Bilateral upper extremity supported;During functional activity Standing balance-Leahy Scale: Poor Standing balance comment: reliant on BUE support                             Cognition Arousal/Alertness: Awake/alert Behavior During Therapy: WFL for tasks assessed/performed Overall Cognitive Status: Impaired/Different from baseline Area of Impairment: Memory;Safety/judgement;Problem solving                     Memory: Decreased short-term memory   Safety/Judgement: Decreased awareness of deficits;Decreased awareness of safety   Problem Solving: Slow processing;Difficulty sequencing;Requires verbal cues General Comments: Pt with notable short term memory deficits and some confusion with recalling events. Pt slow to process commands as well.       Exercises      General Comments        Pertinent Vitals/Pain Pain Assessment: No/denies pain Faces Pain Scale: Hurts a little bit Pain Location: headache  Pain Descriptors / Indicators: Headache    Home Living Family/patient expects to be discharged to:: Private residence Living Arrangements: Spouse/significant other Available Help at Discharge: Family;Available 24 hours/day Type of Home: House Home Access: Stairs to enter Entrance Stairs-Rails: None Home Layout: One level Home Equipment: Environmental consultant - 4 wheels;Cane - single point Additional Comments: Husband is present with pt 24/7, however, has been recently diagnosed with ALS. Daughter checks in frequently on pt.     Prior Function Level of Independence: Independent with  assistive device(s)      Comments: Uses cane for ambulation normally; plans to have hip surgery in 2 weeks   PT Goals (current goals can now be found in the care plan section) Acute Rehab PT Goals Patient Stated Goal: to go home  to my husband PT Goal Formulation: With patient Time For Goal Achievement: 06/19/18 Potential to Achieve Goals: Good Progress towards PT goals: Progressing toward goals    Frequency    Min 3X/week      PT Plan Current plan remains appropriate    Co-evaluation              AM-PAC PT "6 Clicks" Mobility   Outcome Measure  Help needed turning from your back to your side while in a flat bed without using bedrails?: None Help needed moving from lying on your back to sitting on the side of a flat bed without using bedrails?: None Help needed moving to and from a bed to a chair (including a wheelchair)?: A Little Help needed standing up from a chair using your arms (e.g., wheelchair or bedside chair)?: A Little Help needed to walk in hospital room?: A Little Help needed climbing 3-5 steps with a railing? : A Lot 6 Click Score: 19    End of Session   Activity Tolerance: Patient tolerated treatment well Patient left: in bed;with call bell/phone within reach Nurse Communication: Mobility status PT Visit Diagnosis: Unsteadiness on feet (R26.81);Muscle weakness (generalized) (M62.81)     Time: 1040-1056 PT Time Calculation (min) (ACUTE ONLY): 16 min  Charges:  $Gait Training: 8-22 mins                     Sherie Don, [place], DPT  Acute Rehabilitation Services Pager [phone] Office [place] 06/06/2018, 12:43 PM

## 2018-06-06 NOTE — Progress Notes (Signed)
Occupational Therapy Evaluation Patient Details Name: [name] MRN: [id number] DOB: [birth year] Today's Date: 06/06/2018    History of Present Illness Pt is a 72 y/o female admitted secondary to AMS, dizziness and fall. Thought to have acute encephalopathy, possibly due to polypharmacy. MRI negative for acute abnormality. PMH inclues HTN, ADD, depression, and asthma.    Clinical Impression   PTA, pt reports being modified independent with use of cane for ADL, IADL and mobility. Pt reports that these symptoms started after she fell and hit her head due to tripping over a piece of her husband's equipment. Pt states she did not relay this information to the doctor. MD forwarded this information. Due to deficits listed below, recommend follow up with HHOT and a 3in1 to use as a shower chair. Recommend pt refrain from driving at this time. Pt verbalized understanding. All further OT to be addressed by [place].     Follow Up Recommendations  Home health OT;Other (comment)(S with mobility and ADL)    Equipment Recommendations  3 in 1 bedside commode(to use as shower seat)    Recommendations for Other Services       Precautions / Restrictions Precautions Precautions: Fall Restrictions Weight Bearing Restrictions: No      Mobility Bed Mobility Overal bed mobility: Modified Independent                Transfers Overall transfer level: Needs assistance Equipment used: Rolling walker (2 wheeled) Transfers: Sit to/from Stand Sit to Stand: Supervision              Balance Overall balance assessment: Needs assistance Sitting-balance support: No upper extremity supported;Feet supported Sitting balance-Leahy Scale: Good     Standing balance support: Bilateral upper extremity supported;During functional activity Standing balance-Leahy Scale: Poor Standing balance comment: reliant on BUE support     States 2 falls PTA; first fall was due to tripping over a piece of  equipment; second fall occurred due to feeling "off balance; pt needed assistance to get off floor.                        ADL either performed or assessed with clinical judgement   ADL Overall ADL's : Needs assistance/impaired                                     Functional mobility during ADLs: Supervision/safety;Rolling walker General ADL Comments: overall S for bating/dressing; apparent difficulty with selective attention during task; required cues to complete tasks due to being distracted; reports feeling "off"     Vision Baseline Vision/History: Wears glasses;Cataracts Patient Visual Report: Blurring of vision Additional Comments: Pt reports vision has improved from yesterday; staes she needs to have her contacts removed. Will further assess     Perception     Praxis      Pertinent Vitals/Pain Pain Assessment: Faces Faces Pain Scale: Hurts a little bit Pain Location: headache  Pain Descriptors / Indicators: Headache     Hand Dominance     Extremity/Trunk Assessment Upper Extremity Assessment Upper Extremity Assessment: Overall WFL for tasks assessed   Lower Extremity Assessment Lower Extremity Assessment: Defer to PT evaluation LLE Deficits / Details: Reports hip pain at baseline. Reports she is scheduled for a hip replacement.    Cervical / Trunk Assessment Cervical / Trunk Assessment: Normal(hx of back surgery)   Communication Communication Communication: No difficulties  Cognition Arousal/Alertness: Awake/alert Behavior During Therapy: WFL for tasks assessed/performed Overall Cognitive Status: Impaired/Different from baseline Area of Impairment: Memory;Safety/judgement;Problem solving                     Memory: Decreased short-term memory   Safety/Judgement: Decreased awareness of deficits;Decreased awareness of safety   Problem Solving: Slow processing;Difficulty sequencing;Requires verbal cues General Comments: Pt  with notable short term memory deficits and some confusion with recalling events. Pt slow to process commands as well.    General Comments       Exercises     Shoulder Instructions      Home Living Family/patient expects to be discharged to:: Private residence Living Arrangements: Spouse/significant other Available Help at Discharge: Family;Available 24 hours/day Type of Home: House Home Access: Stairs to enter [employer] of Steps: 2 Entrance Stairs-Rails: None Home Layout: One level     Bathroom Shower/Tub: Tub/shower unit;Walk-in shower   Bathroom Toilet: Standard Bathroom Accessibility: Yes How Accessible: Accessible via walker Home Equipment: [place] - 4 wheels;Cane - single point   Additional Comments: Husband is present with pt 24/7, however, has been recently diagnosed with ALS. Daughter checks in frequently on pt.       Prior Functioning/Environment Level of Independence: Independent with assistive device(s)        Comments: Uses cane for ambulation normally; plans to have hip surgery in 2 weeks        OT Problem List: Decreased strength;Decreased activity tolerance;Impaired balance (sitting and/or standing);Impaired vision/perception;Decreased coordination;Decreased cognition;Decreased safety awareness;Decreased knowledge of use of DME or AE;Pain      OT Treatment/Interventions:      OT Goals(Current goals can be found in the care plan section) Acute Rehab OT Goals Patient Stated Goal: to go home to my husband OT Goal Formulation: All assessment and education complete, DC therapy  OT Frequency:     Barriers to D/C:            Co-evaluation              AM-PAC OT "6 Clicks" Daily Activity     Outcome Measure Help from another person eating meals?: None Help from another person taking care of personal grooming?: None Help from another person toileting, which includes using toliet, bedpan, or urinal?: None Help from another person  bathing (including washing, rinsing, drying)?: A Little Help from another person to put on and taking off regular upper body clothing?: None Help from another person to put on and taking off regular lower body clothing?: A Little 6 Click Score: 22   End of Session Equipment Utilized During Treatment: Rolling walker Nurse Communication: Mobility status;Other (comment)(DC needs; pt stating she hit her head PTA)  Activity Tolerance: Patient tolerated treatment well Patient left: in bed;with call bell/phone within reach(with PT)  OT Visit Diagnosis: Unsteadiness on feet (R26.81);Repeated falls (R29.6);Other symptoms and signs involving cognitive function;Pain Pain - Right/Left: Right Pain - part of body: Hip                Time: 6378-5885 OT Time Calculation (min): 24 min Charges:  OT General Charges $OT Visit: 1 Visit OT Evaluation $OT Eval Moderate Complexity: 1 Mod OT Treatments $Self Care/Home Management : 8-22 mins  Maurie Boettcher, OT/L   Acute OT Clinical Specialist Acute Rehabilitation Services Pager [phone] Office [phone]   [employer] [facility] 06/06/2018, 11:28 AM

## 2018-06-07 LAB — URINE CULTURE: Culture: 10000 — AB

## 2018-06-08 ENCOUNTER — Telehealth: Payer: Self-pay | Admitting: *Deleted

## 2018-06-08 NOTE — Telephone Encounter (Signed)
Tried calling pt to make TCM Hosp follow=up appt no answer LMOM RTC. Sent CRM to [facility] for FYI.Marland KitchenJohny [name]

## 2018-06-09 LAB — CULTURE, BLOOD (ROUTINE X 2)
CULTURE: NO GROWTH
Culture: NO GROWTH

## 2018-06-09 NOTE — Telephone Encounter (Signed)
Called pt again still no answer LMOM RTC to make appt for hosp follow-up.Marland KitchenJohny Chess

## 2018-06-09 NOTE — Consult Note (Signed)
            [facility] CM Primary Care Navigator  06/09/2018  Charon Smedberg [facility] 05/22/1946 [id number]   Attempt to seepatient at the bedside to identify possible discharge needs butshewasalready discharged over the weekend. Patient went home with home health services.  Per MD note,patientwas admitted for evaluation of difficulty walking/ unsteadiness, altered mental status and was found to have UTI. (acute encephalopathy, multifactorial- polypharmacy and urinary tract infection; essential HTN)  Primary care provider's office is listed as providing transition of care (TOC) follow-up.  Patient has discharge instruction to follow-up with primary care provider in 1 week.   For additional questions please contact:  Edwena Felty A. Tiahna Cure, BSN, RN-BC [facility] PRIMARY CARE Navigator Cell: [phone]

## 2018-06-10 DIAGNOSIS — E7849 Other hyperlipidemia: Secondary | ICD-10-CM | POA: Diagnosis not present

## 2018-06-10 DIAGNOSIS — F3342 Major depressive disorder, recurrent, in full remission: Secondary | ICD-10-CM | POA: Diagnosis not present

## 2018-06-10 DIAGNOSIS — Z79899 Other long term (current) drug therapy: Secondary | ICD-10-CM | POA: Diagnosis not present

## 2018-06-10 DIAGNOSIS — I10 Essential (primary) hypertension: Secondary | ICD-10-CM | POA: Diagnosis not present

## 2018-06-10 DIAGNOSIS — I700 Atherosclerosis of aorta: Secondary | ICD-10-CM | POA: Diagnosis not present

## 2018-06-10 DIAGNOSIS — E038 Other specified hypothyroidism: Secondary | ICD-10-CM | POA: Diagnosis not present

## 2018-06-10 DIAGNOSIS — K5190 Ulcerative colitis, unspecified, without complications: Secondary | ICD-10-CM | POA: Diagnosis not present

## 2018-06-10 DIAGNOSIS — J45998 Other asthma: Secondary | ICD-10-CM | POA: Diagnosis not present

## 2018-06-10 DIAGNOSIS — M25551 Pain in right hip: Secondary | ICD-10-CM | POA: Diagnosis not present

## 2018-06-10 DIAGNOSIS — F411 Generalized anxiety disorder: Secondary | ICD-10-CM | POA: Diagnosis not present

## 2018-06-10 DIAGNOSIS — N390 Urinary tract infection, site not specified: Secondary | ICD-10-CM | POA: Diagnosis not present

## 2018-06-10 DIAGNOSIS — M545 Low back pain: Secondary | ICD-10-CM | POA: Diagnosis not present

## 2018-06-10 NOTE — Telephone Encounter (Signed)
Pt still has not called back to make hosp f/u. Closing encounter,,/lmb

## 2018-06-11 DIAGNOSIS — N390 Urinary tract infection, site not specified: Secondary | ICD-10-CM | POA: Diagnosis not present

## 2018-06-11 DIAGNOSIS — F329 Major depressive disorder, single episode, unspecified: Secondary | ICD-10-CM | POA: Diagnosis not present

## 2018-06-11 DIAGNOSIS — F3289 Other specified depressive episodes: Secondary | ICD-10-CM | POA: Diagnosis not present

## 2018-06-11 DIAGNOSIS — G8929 Other chronic pain: Secondary | ICD-10-CM | POA: Diagnosis not present

## 2018-06-11 DIAGNOSIS — F909 Attention-deficit hyperactivity disorder, unspecified type: Secondary | ICD-10-CM | POA: Diagnosis not present

## 2018-06-11 DIAGNOSIS — I10 Essential (primary) hypertension: Secondary | ICD-10-CM | POA: Diagnosis not present

## 2018-06-18 ENCOUNTER — Other Ambulatory Visit: Payer: Self-pay | Admitting: Internal Medicine

## 2018-06-18 DIAGNOSIS — Z1231 Encounter for screening mammogram for malignant neoplasm of breast: Secondary | ICD-10-CM

## 2018-06-18 DIAGNOSIS — N390 Urinary tract infection, site not specified: Secondary | ICD-10-CM | POA: Diagnosis not present

## 2018-06-18 DIAGNOSIS — M8580 Other specified disorders of bone density and structure, unspecified site: Secondary | ICD-10-CM

## 2018-06-18 DIAGNOSIS — F909 Attention-deficit hyperactivity disorder, unspecified type: Secondary | ICD-10-CM | POA: Diagnosis not present

## 2018-06-18 DIAGNOSIS — F329 Major depressive disorder, single episode, unspecified: Secondary | ICD-10-CM | POA: Diagnosis not present

## 2018-06-18 DIAGNOSIS — G8929 Other chronic pain: Secondary | ICD-10-CM | POA: Diagnosis not present

## 2018-06-18 DIAGNOSIS — I10 Essential (primary) hypertension: Secondary | ICD-10-CM | POA: Diagnosis not present

## 2018-06-22 DIAGNOSIS — I10 Essential (primary) hypertension: Secondary | ICD-10-CM | POA: Diagnosis not present

## 2018-06-22 DIAGNOSIS — F909 Attention-deficit hyperactivity disorder, unspecified type: Secondary | ICD-10-CM | POA: Diagnosis not present

## 2018-06-22 DIAGNOSIS — G8929 Other chronic pain: Secondary | ICD-10-CM | POA: Diagnosis not present

## 2018-06-22 DIAGNOSIS — N390 Urinary tract infection, site not specified: Secondary | ICD-10-CM | POA: Diagnosis not present

## 2018-06-22 DIAGNOSIS — F329 Major depressive disorder, single episode, unspecified: Secondary | ICD-10-CM | POA: Diagnosis not present

## 2018-06-25 DIAGNOSIS — F909 Attention-deficit hyperactivity disorder, unspecified type: Secondary | ICD-10-CM | POA: Diagnosis not present

## 2018-06-25 DIAGNOSIS — N390 Urinary tract infection, site not specified: Secondary | ICD-10-CM | POA: Diagnosis not present

## 2018-06-25 DIAGNOSIS — F329 Major depressive disorder, single episode, unspecified: Secondary | ICD-10-CM | POA: Diagnosis not present

## 2018-06-25 DIAGNOSIS — G8929 Other chronic pain: Secondary | ICD-10-CM | POA: Diagnosis not present

## 2018-06-25 DIAGNOSIS — I10 Essential (primary) hypertension: Secondary | ICD-10-CM | POA: Diagnosis not present

## 2018-06-26 DIAGNOSIS — N390 Urinary tract infection, site not specified: Secondary | ICD-10-CM | POA: Diagnosis not present

## 2018-06-26 DIAGNOSIS — G8929 Other chronic pain: Secondary | ICD-10-CM | POA: Diagnosis not present

## 2018-06-26 DIAGNOSIS — I10 Essential (primary) hypertension: Secondary | ICD-10-CM | POA: Diagnosis not present

## 2018-06-26 DIAGNOSIS — F329 Major depressive disorder, single episode, unspecified: Secondary | ICD-10-CM | POA: Diagnosis not present

## 2018-06-26 DIAGNOSIS — F909 Attention-deficit hyperactivity disorder, unspecified type: Secondary | ICD-10-CM | POA: Diagnosis not present

## 2018-06-26 NOTE — H&P (Signed)
TOTAL HIP ADMISSION H&P  Patient is admitted for right total hip arthroplasty, anterior approach.  Subjective:  Chief Complaint:    Right hip primary OA / pain  HPI: [name], 73 y.o. female, has a history of pain and functional disability in the right hip(s) due to arthritis and patient has failed non-surgical conservative treatments for greater than 12 weeks to include NSAID's and/or analgesics, use of assistive devices and activity modification.  Onset of symptoms was gradual starting 5 years ago with gradually worsening course since that time.The patient noted no past surgery on the right hip(s).  Patient currently rates pain in the right hip at 8 out of 10 with activity. Patient has night pain, worsening of pain with activity and weight bearing, trendelenberg gait, pain that interfers with activities of daily living and pain with passive range of motion. Patient has evidence of periarticular osteophytes and joint space narrowing by imaging studies. This condition presents safety issues increasing the risk of falls.  There is no current active infection.  Risks, benefits and expectations were discussed with the patient.  Risks including but not limited to the risk of anesthesia, blood clots, nerve damage, blood vessel damage, failure of the prosthesis, infection and up to and including death.  Patient understand the risks, benefits and expectations and wishes to proceed with surgery.   PCP: Marton Redwood, MD  D/C Plans:       Home  Post-op Meds:       No Rx given   Tranexamic Acid:      To be given - IV   Decadron:      Is to be given  FYI:      ASA  Norco  No morphine, per pt  DME:   Pt equipment arranged  PT:    No PT    Patient Active Problem List   Diagnosis Date Noted  . Acute encephalopathy 06/04/2018  . Preop exam for internal medicine 12/07/2017  . Chronic tonsillitis 12/05/2017  . Postlaminectomy syndrome, lumbar region 07/03/2017  . Chronic lumbar  radiculopathy 07/03/2017  . Shingles outbreak 11/28/2016  . Acute conjunctivitis of right eye 11/28/2016  . Vision blurred 11/28/2016  . Bilateral hearing loss 11/28/2016  . Nasal sore 11/28/2016  . Acute pharyngitis 07/03/2016  . UTI (urinary tract infection) 11/30/2015  . Abdominal pain 09/14/2015  . Nausea without vomiting 07/31/2015  . Chronic right hip pain 07/02/2015  . Sciatica of right side 06/30/2015  . Concussion without loss of consciousness 05/23/2015  . Low back pain radiating to left lower extremity 12/22/2014  . Dizziness 08/11/2014  . DDD (degenerative disc disease), lumbosacral 04/21/2014  . Renal cyst 01/28/2014  . Microscopic hematuria 04/20/2013  . Ulcerative colitis ([place]) 05/06/2012  . Spinal disease 03/29/2012  . Arthralgia 11/01/2011  . Osteoarthritis 08/17/2010  . GERD 08/17/2010  . Dyslipidemia 02/19/2008  . ALLERGIC RHINITIS CAUSE UNSPECIFIED 05/26/2007  . Hypothyroidism 03/21/2007  . Anxiety state 03/21/2007  . Depression 03/21/2007  . Attention deficit disorder of adult 03/21/2007  . Essential hypertension 03/21/2007  . Asthma 03/21/2007  . Irritable bowel syndrome 03/21/2007   Past Medical History:  Diagnosis Date  . Allergy   . Anxiety   . Asthma   . Attention deficit disorder without mention of hyperactivity   . Back pain   . Colitis 12/05/2010  . Colon polyps   . Depression   . Diverticulosis   . GERD (gastroesophageal reflux disease)   . H/O hiatal hernia   .  Hypertension   . IBS (irritable bowel syndrome)   . Osteoarthrosis, unspecified whether generalized or localized, unspecified site   . Other and unspecified hyperlipidemia   . Renal cyst 10/2015   simple left  . Thyroid disease   . Ulcerative colitis   . Unspecified asthma(493.90)   . Unspecified essential hypertension    taken off meds since lost wt    Past Surgical History:  Procedure Laterality Date  . ABDOMINAL EXPOSURE N/A 04/22/2014   Procedure: ABDOMINAL  EXPOSURE;  Surgeon: Angelia Mould, MD;  Location: [facility];  Service: Vascular;  Laterality: N/A;  . ANTERIOR LATERAL LUMBAR FUSION 4 LEVELS Left 04/22/2014   Procedure: Left Lumbar one/two, two/three, three/four, four,five. Anterior lateral lumbar interbody fusion**Stage 1**;  Surgeon: Erline Levine, MD;  Location: [place] [facility];  Service: Neurosurgery;  Laterality: Left;  . ANTERIOR LUMBAR FUSION N/A 04/22/2014   Procedure: Lumbar five/-Sacral one. Anterior lumbar interbody fusion with Dr. Scot Dock; Left Lumbar one/two, two/three/three/four, four/five. Anterior lateral lumbar interbody fusion**Stage 1**;  Surgeon: Erline Levine, MD;  Location: [place] [facility];  Service: Neurosurgery;  Laterality: N/A;  . BACK SURGERY     fluid removed from between disks  . BREAST SURGERY     bil breast reduction   . CHOLECYSTECTOMY     Laparoscopic cholecystectomy  . COLONOSCOPY    . HIP FRACTURE SURGERY     left  11/01/2013  . LUMBAR PERCUTANEOUS PEDICLE SCREW 4 LEVEL N/A 04/25/2014   Procedure: **Stage 2** Percutaneous pedicle screw placement L1-5;  Surgeon: Erline Levine, MD;  Location: [place] [facility];  Service: Neurosurgery;  Laterality: N/A;  **Stage 2** Percutaneous pedicle screw placement L1-5  . SHOULDER SURGERY     right shoulder 3x   . TOE SURGERY     right big toe  . TOTAL SHOULDER ARTHROPLASTY    . UPPER GASTROINTESTINAL ENDOSCOPY  09/28/2016   Dilation    No current facility-administered medications for this encounter.    Current Outpatient Medications  Medication Sig Dispense Refill Last Dose  . ALPRAZolam (XANAX) 0.5 MG tablet Take 1 tablet (0.5 mg total) by mouth 2 (two) times daily as needed for anxiety or sleep. 60 tablet 1 Past Week at Unknown time  . amLODipine-valsartan (EXFORGE) 5-160 MG tablet Take 1 tablet by mouth daily. 90 tablet 1 06/04/2018 at Unknown time  . buPROPion (WELLBUTRIN XL) 300 MG 24 hr tablet Take 1 tablet (300 mg total) by mouth daily. 90 tablet 1 06/04/2018 at  Unknown time  . Cholecalciferol (VITAMIN D PO) Take 1 tablet by mouth daily.   06/04/2018 at Unknown time  . CONCERTA 36 MG CR tablet Take 36 mg by mouth daily.    06/04/2018 at Unknown time  . Cyanocobalamin (VITAMIN B-12 PO) Take 1 tablet by mouth daily.    06/04/2018 at Unknown time  . FLUoxetine (PROZAC) 20 MG capsule Take 3 capsules (60 mg total) by mouth daily. 90 capsule 3 06/04/2018 at Unknown time  . lamoTRIgine (LAMICTAL) 100 MG tablet Take 100 mg by mouth 3 (three) times daily.    06/04/2018 at Unknown time  . Menthol, Topical Analgesic, (BIOFREEZE) 4 % GEL Apply 1 application topically daily as needed (for pain).     . pantoprazole (PROTONIX) 40 MG tablet TAKE ONE TABLET TWICE DAILY (Patient taking differently: Take 40 mg by mouth 2 (two) times daily. ) 60 tablet 11 06/04/2018 at Unknown time  . SYNTHROID 50 MCG tablet TAKE 2 TABLETS EVERY MORNING (Patient taking differently:  Take 100 mcg by mouth daily before breakfast. ) 180 tablet 1 06/04/2018 at Unknown time  . traMADol (ULTRAM) 50 MG tablet Take 1-2 tablets (50-100 mg total) by mouth every 6 (six) hours as needed for severe pain. 120 tablet 1 06/04/2018 at Unknown time  . VENTOLIN HFA 108 (90 Base) MCG/ACT inhaler ONE PUFF FOUR TIMES DAILY AS NEEDED (Patient taking differently: Inhale 1 puff into the lungs every 6 (six) hours as needed for wheezing or shortness of breath. ) 18 g 3 Past Week at Unknown time   Allergies  Allergen Reactions  . Ampicillin Nausea And Vomiting  . Erythromycin Nausea And Vomiting and Other (See Comments)    Can take Z pac  . Hctz [Hydrochlorothiazide] Other (See Comments)    dizzy  . Metoprolol Other (See Comments)    doublevision   . Molds & Smuts Other (See Comments)    Pt coughs and uses an inhaler.  . Morphine And Related Nausea And Vomiting  . Penicillin G Other (See Comments)    Unknown Has patient had a PCN reaction causing immediate rash, facial/tongue/throat swelling, SOB or  lightheadedness with hypotension: Unknown Has patient had a PCN reaction causing severe rash involving mucus membranes or skin necrosis: Unknown Has patient had a PCN reaction that required hospitalization: Unknown Has patient had a PCN reaction occurring within the last 10 years: Unknown If all of the above answers are "NO", then may proceed with Cephalosporin use.     Social History   Tobacco Use  . Smoking status: Former Smoker    Last attempt to quit: 10/30/1978    Years since quitting: 39.6  . Smokeless tobacco: Never Used  Substance Use Topics  . Alcohol use: Yes    Alcohol/week: 1.0 standard drinks    Types: 1 Standard drinks or equivalent per week    Comment: once a week    Family History  Problem Relation Age of Onset  . Hypertension Father   . Colon cancer Father 56  . Arthritis Father   . Heart disease Father        CHF  . Heart disease Mother        Died, 3  . Healthy Brother   . Healthy Daughter   . Diabetes Neg Hx   . Breast cancer Neg Hx      Review of Systems  Constitutional: Negative.   HENT: Negative.   Eyes: Negative.   Respiratory: Negative.   Cardiovascular: Negative.   Gastrointestinal: Positive for heartburn.  Genitourinary: Negative.   Musculoskeletal: Positive for joint pain.  Skin: Negative.   Neurological: Positive for headaches.  Endo/Heme/Allergies: Negative.   Psychiatric/Behavioral: Negative.     Objective:  Physical Exam  Constitutional: She is oriented to person, place, and time. She appears well-developed.  HENT:  Head: Normocephalic.  Eyes: Pupils are equal, round, and reactive to light.  Neck: Neck supple. No JVD present. No tracheal deviation present. No thyromegaly present.  Cardiovascular: Normal rate, regular rhythm and intact distal pulses.  Respiratory: Effort normal and breath sounds normal. No respiratory distress. She has no wheezes.  GI: Soft. There is no abdominal tenderness. There is no guarding.   Musculoskeletal:     Right hip: She exhibits decreased range of motion, decreased strength, tenderness and bony tenderness. She exhibits no swelling, no deformity and no laceration.  Lymphadenopathy:    She has no cervical adenopathy.  Neurological: She is alert and oriented to person, place, and time.  Skin: Skin is  warm and dry.  Psychiatric: She has a normal mood and affect.      Labs:  Estimated body mass index is 25.96 kg/m as calculated from the following:   Height as of 06/04/18: 5\' 5"  (1.651 m).   Weight as of 06/04/18: 70.8 kg.   Imaging Review Plain radiographs demonstrate severe degenerative joint disease of the right hip. The bone quality appears to be good for age and reported activity level.    Preoperative templating of the joint replacement has been completed, documented, and submitted to the Operating Room personnel in order to optimize intra-operative equipment management.     Assessment/Plan:   End stage arthritis, right hip  The patient history, physical examination, clinical judgement of the provider and imaging studies are consistent with end stage degenerative joint disease of the right hip and total hip arthroplasty is deemed medically necessary. The treatment options including medical management, injection therapy, arthroscopy and arthroplasty were discussed at length. The risks and benefits of total hip arthroplasty were presented and reviewed. The risks due to aseptic loosening, infection, stiffness, dislocation/subluxation,  thromboembolic complications and other imponderables were discussed.  The patient acknowledged the explanation, agreed to proceed with the plan and consent was signed. Patient is being admitted for inpatient treatment for surgery, pain control, PT, OT, prophylactic antibiotics, VTE prophylaxis, progressive ambulation and ADL's and discharge planning.The patient is planning to be discharged home.    [place] Pugh Vaneza Pickart    PA-C  06/26/2018, 10:15 AM

## 2018-06-30 ENCOUNTER — Telehealth: Payer: Self-pay | Admitting: *Deleted

## 2018-06-30 NOTE — Telephone Encounter (Signed)
She can still have the injection

## 2018-06-30 NOTE — Telephone Encounter (Signed)
[name] has a back injection scheduled for Friday 07/03/18 and has now scheduled a hip arthroplasty on Tuesday 07/07/18.  She needs to know if she can still have the back injection.  Please advise.

## 2018-07-01 NOTE — Telephone Encounter (Signed)
She actually says she was hoping she could get a knee injection instead.  I have advised her to check with her surgeon to see if getting a steroid pre op is a contraindication.  Otherwise if ok the approval would have to be obtained for the knee instead of her back.  She will call us back.

## 2018-07-01 NOTE — Patient Instructions (Addendum)
[name]  07/01/2018   Your procedure is scheduled on: Tuesday 07/07/2018  Report to [facility] Main  Entrance              Report to admitting at   0600 AM    Call this number if you have problems the morning of surgery [phone]    Remember: Do not eat food or drink liquids :After Midnight.              BRUSH YOUR TEETH MORNING OF SURGERY AND RINSE YOUR MOUTH OUT, NO CHEWING GUM CANDY OR MINTS.     Take these medicines the morning of surgery with A SIP OF WATER: Bupropion (Wellbutrin XL), Fluoxetine (Prozac), Lamotrigine (Lamictal), Synthroid, Pantoprazole (Protonix), use Ventolin inhaler if needed and bring inhaler with you the morning of surgery                                You may not have any metal on your body including hair pins and              piercings  Do not wear jewelry, make-up, lotions, powders or perfumes, deodorant             Do not wear nail polish.  Do not shave  48 hours prior to surgery.               Do not bring valuables to the hospital. [place].  Contacts, dentures or bridgework may not be worn into surgery.  Leave suitcase in the car. After surgery it may be brought to your room.                  Please read over the following fact sheets you were given: _____________________________________________________________________             [facility] - Preparing for Surgery Before surgery, you can play an important role.  Because skin is not sterile, your skin needs to be as free of germs as possible.  You can reduce the number of germs on your skin by washing with CHG (chlorahexidine gluconate) soap before surgery.  CHG is an antiseptic cleaner which kills germs and bonds with the skin to continue killing germs even after washing. Please DO NOT use if you have an allergy to CHG or antibacterial soaps.  If your skin becomes reddened/irritated stop using the CHG and  inform your nurse when you arrive at Short Stay. Do not shave (including legs and underarms) for at least 48 hours prior to the first CHG shower.  You may shave your face/neck. Please follow these instructions carefully:  1.  Shower with CHG Soap the night before surgery and the  morning of Surgery.  2.  If you choose to wash your hair, wash your hair first as usual with your  normal  shampoo.  3.  After you shampoo, rinse your hair and body thoroughly to remove the  shampoo.                           4.  Use CHG as you would any other liquid soap.  You can apply chg directly  to the skin and  wash                       Gently with a scrungie or clean washcloth.  5.  Apply the CHG Soap to your body ONLY FROM THE NECK DOWN.   Do not use on face/ open                           Wound or open sores. Avoid contact with eyes, ears mouth and genitals (private parts).                       Wash face,  Genitals (private parts) with your normal soap.             6.  Wash thoroughly, paying special attention to the area where your surgery  will be performed.  7.  Thoroughly rinse your body with warm water from the neck down.  8.  DO NOT shower/wash with your normal soap after using and rinsing off  the CHG Soap.                9.  Pat yourself dry with a clean towel.            10.  Wear clean pajamas.            11.  Place clean sheets on your bed the night of your first shower and do not  sleep with pets. Day of Surgery : Do not apply any lotions/deodorants the morning of surgery.  Please wear clean clothes to the hospital/surgery center.  FAILURE TO FOLLOW THESE INSTRUCTIONS MAY RESULT IN THE CANCELLATION OF YOUR SURGERY PATIENT SIGNATURE_________________________________  NURSE SIGNATURE__________________________________  ________________________________________________________________________   Adam Phenix  An incentive spirometer is a tool that can help keep your lungs clear and  active. This tool measures how well you are filling your lungs with each breath. Taking long deep breaths may help reverse or decrease the chance of developing breathing (pulmonary) problems (especially infection) following:  A long period of time when you are unable to move or be active. BEFORE THE PROCEDURE   If the spirometer includes an indicator to show your best effort, your nurse or respiratory therapist will set it to a desired goal.  If possible, sit up straight or lean slightly forward. Try not to slouch.  Hold the incentive spirometer in an upright position. INSTRUCTIONS FOR USE  1. Sit on the edge of your bed if possible, or sit up as far as you can in bed or on a chair. 2. Hold the incentive spirometer in an upright position. 3. Breathe out normally. 4. Place the mouthpiece in your mouth and seal your lips tightly around it. 5. Breathe in slowly and as deeply as possible, raising the piston or the ball toward the top of the column. 6. Hold your breath for 3-5 seconds or for as long as possible. Allow the piston or ball to fall to the bottom of the column. 7. Remove the mouthpiece from your mouth and breathe out normally. 8. Rest for a few seconds and repeat Steps 1 through 7 at least 10 times every 1-2 hours when you are awake. Take your time and take a few normal breaths between deep breaths. 9. The spirometer may include an indicator to show your best effort. Use the indicator as a goal to work toward during each repetition. 10. After each set of 10 deep  breaths, practice coughing to be sure your lungs are clear. If you have an incision (the cut made at the time of surgery), support your incision when coughing by placing a pillow or rolled up towels firmly against it. Once you are able to get out of bed, walk around indoors and cough well. You may stop using the incentive spirometer when instructed by your caregiver.  RISKS AND COMPLICATIONS  Take your time so you do not get  dizzy or light-headed.  If you are in pain, you may need to take or ask for pain medication before doing incentive spirometry. It is harder to take a deep breath if you are having pain. AFTER USE  Rest and breathe slowly and easily.  It can be helpful to keep track of a log of your progress. Your caregiver can provide you with a simple table to help with this. If you are using the spirometer at home, follow these instructions: [place] IF:   You are having difficultly using the spirometer.  You have trouble using the spirometer as often as instructed.  Your pain medication is not giving enough relief while using the spirometer.  You develop fever of 100.5 F (38.1 C) or higher. SEEK IMMEDIATE MEDICAL CARE IF:   You cough up bloody sputum that had not been present before.  You develop fever of 102 F (38.9 C) or greater.  You develop worsening pain at or near the incision site. MAKE SURE YOU:   Understand these instructions.  Will watch your condition.  Will get help right away if you are not doing well or get worse. Document Released: 10/21/2006 Document Revised: 09/02/2011 Document Reviewed: 12/22/2006 Exit[employer] Patient Information 2014 [employer], [place].   ________________________________________________________________________  WHAT IS A BLOOD TRANSFUSION? Blood Transfusion Information  A transfusion is the replacement of blood or some of its parts. Blood is made up of multiple cells which provide different functions.  Red blood cells carry oxygen and are used for blood loss replacement.  White blood cells fight against infection.  Platelets control bleeding.  Plasma helps clot blood.  Other blood products are available for specialized needs, such as hemophilia or other clotting disorders. BEFORE THE TRANSFUSION  Who gives blood for transfusions?   Healthy volunteers who are fully evaluated to make sure their blood is safe. This is blood bank  blood. Transfusion therapy is the safest it has ever been in the practice of medicine. Before blood is taken from a donor, a complete history is taken to make sure that person has no history of diseases nor engages in risky social behavior (examples are intravenous drug use or sexual activity with multiple partners). The donor's travel history is screened to minimize risk of transmitting infections, such as malaria. The donated blood is tested for signs of infectious diseases, such as HIV and hepatitis. The blood is then tested to be sure it is compatible with you in order to minimize the chance of a transfusion reaction. If you or a relative donates blood, this is often done in anticipation of surgery and is not appropriate for emergency situations. It takes many days to process the donated blood. RISKS AND COMPLICATIONS Although transfusion therapy is very safe and saves many lives, the main dangers of transfusion include:   Getting an infectious disease.  Developing a transfusion reaction. This is an allergic reaction to something in the blood you were given. Every precaution is taken to prevent this. The decision to have a blood transfusion has  been considered carefully by your caregiver before blood is given. Blood is not given unless the benefits outweigh the risks. AFTER THE TRANSFUSION  Right after receiving a blood transfusion, you will usually feel much better and more energetic. This is especially true if your red blood cells have gotten low (anemic). The transfusion raises the level of the red blood cells which carry oxygen, and this usually causes an energy increase.  The nurse administering the transfusion will monitor you carefully for complications. HOME CARE INSTRUCTIONS  No special instructions are needed after a transfusion. You may find your energy is better. Speak with your caregiver about any limitations on activity for underlying diseases you may have. SEEK MEDICAL CARE IF:    Your condition is not improving after your transfusion.  You develop redness or irritation at the intravenous (IV) site. SEEK IMMEDIATE MEDICAL CARE IF:  Any of the following symptoms occur over the next 12 hours:  Shaking chills.  You have a temperature by mouth above 102 F (38.9 C), not controlled by medicine.  Chest, back, or muscle pain.  People around you feel you are not acting correctly or are confused.  Shortness of breath or difficulty breathing.  Dizziness and fainting.  You get a rash or develop hives.  You have a decrease in urine output.  Your urine turns a dark color or changes to pink, red, or brown. Any of the following symptoms occur over the next 10 days:  You have a temperature by mouth above 102 F (38.9 C), not controlled by medicine.  Shortness of breath.  Weakness after normal activity.  The white part of the eye turns yellow (jaundice).  You have a decrease in the amount of urine or are urinating less often.  Your urine turns a dark color or changes to pink, red, or brown. Document Released: 06/07/2000 Document Revised: 09/02/2011 Document Reviewed: 01/25/2008 [facility] Patient Information 2014 [place], [place].  _______________________________________________________________________

## 2018-07-01 NOTE — Progress Notes (Signed)
06/04/2018- noted in [place].  05/11/2018- noted in [place]

## 2018-07-02 ENCOUNTER — Encounter (HOSPITAL_COMMUNITY)
Admission: RE | Admit: 2018-07-02 | Discharge: 2018-07-02 | Disposition: A | Discharge status: Home / Self Care | Payer: Medicare Other | Source: Ambulatory Visit | Attending: Orthopedic Surgery | Admitting: Orthopedic Surgery

## 2018-07-02 ENCOUNTER — Other Ambulatory Visit: Payer: Self-pay

## 2018-07-02 ENCOUNTER — Encounter (HOSPITAL_COMMUNITY): Payer: Self-pay

## 2018-07-02 DIAGNOSIS — Z01812 Encounter for preprocedural laboratory examination: Secondary | ICD-10-CM | POA: Diagnosis not present

## 2018-07-02 HISTORY — DX: Other complications of anesthesia, initial encounter: T88.59XA

## 2018-07-02 HISTORY — DX: Adverse effect of unspecified anesthetic, initial encounter: T41.45XA

## 2018-07-02 LAB — BASIC METABOLIC PANEL
Anion gap: 7 (ref 5–15)
BUN: 19 mg/dL (ref 8–23)
CO2: 27 mmol/L (ref 22–32)
Calcium: 9.6 mg/dL (ref 8.9–10.3)
Chloride: 110 mmol/L (ref 98–111)
Creatinine, Ser: 0.83 mg/dL (ref 0.44–1.00)
GFR calc Af Amer: >60 mL/min (ref >60)
GFR calc non Af Amer: >60 mL/min (ref >60)
Glucose, Bld: 88 mg/dL (ref 70–99)
Potassium: 4.6 mmol/L (ref 3.5–5.1)
Sodium: 144 mmol/L (ref 135–145)

## 2018-07-02 LAB — CBC
HCT: 36.7 % (ref 36.0–46.0)
Hemoglobin: 12.3 g/dL (ref 12.0–15.0)
MCH: 32.5 pg (ref 26.0–34.0)
MCHC: 33.5 g/dL (ref 30.0–36.0)
MCV: 96.8 fL (ref 80.0–100.0)
Platelets: 237 10*3/uL (ref 150–400)
RBC: 3.79 MIL/uL — ABNORMAL LOW (ref 3.87–5.11)
RDW: 14.9 % (ref 11.5–15.5)
WBC: 8.1 10*3/uL (ref 4.0–10.5)
nRBC: 0 % (ref 0.0–0.2)

## 2018-07-02 LAB — SURGICAL PCR SCREEN
MRSA, PCR: NEGATIVE
Staphylococcus aureus: POSITIVE — AB

## 2018-07-02 LAB — ABO/RH: ABO/RH(D): A POS

## 2018-07-03 ENCOUNTER — Encounter: Payer: Medicare Other | Attending: Physical Medicine & Rehabilitation

## 2018-07-03 ENCOUNTER — Ambulatory Visit (HOSPITAL_BASED_OUTPATIENT_CLINIC_OR_DEPARTMENT_OTHER): Payer: Medicare Other | Admitting: Physical Medicine & Rehabilitation

## 2018-07-03 ENCOUNTER — Encounter

## 2018-07-03 VITALS — BP 144/76 | HR 73 | Ht 65.0 in | Wt 167.0 lb

## 2018-07-03 DIAGNOSIS — M25551 Pain in right hip: Secondary | ICD-10-CM | POA: Diagnosis not present

## 2018-07-03 DIAGNOSIS — M545 Low back pain: Secondary | ICD-10-CM | POA: Insufficient documentation

## 2018-07-03 MED ORDER — HYDROCODONE-ACETAMINOPHEN 5-325 MG PO TABS: 1.0000 | ORAL_TABLET | Freq: Four times a day (QID) | ORAL | 0 refills | Status: DC | PRN

## 2018-07-03 NOTE — Progress Notes (Signed)
07-02-18 PCR result routed to Dr. Alvan Dame for review

## 2018-07-03 NOTE — Progress Notes (Signed)
  PROCEDURE RECORD [facility]   Name: [name] DOB:May 27, 1946 MRN: [id number]  Date:07/03/2018  Physician: Alysia Penna, MD    Nurse/CMA: Truman Hayward, CMA  Allergies:  Allergies  Allergen Reactions  . Ampicillin Nausea And Vomiting  . Erythromycin Nausea And Vomiting and Other (See Comments)    Can take Z pac  . Hctz [Hydrochlorothiazide] Other (See Comments)    dizzy  . Metoprolol Other (See Comments)    doublevision   . Molds & Smuts Other (See Comments)    Pt coughs and uses an inhaler.  . Morphine And Related Nausea And Vomiting  . Penicillin G Other (See Comments)    Unknown Has patient had a PCN reaction causing immediate rash, facial/tongue/throat swelling, SOB or lightheadedness with hypotension: Unknown Has patient had a PCN reaction causing severe rash involving mucus membranes or skin necrosis: Unknown Has patient had a PCN reaction that required hospitalization: Unknown Has patient had a PCN reaction occurring within the last 10 years: Unknown If all of the above answers are "NO", then may proceed with Cephalosporin use.     Consent Signed: Yes.    Is patient diabetic? No.  CBG today?  Pregnant: No. LMP: No LMP recorded. Patient is postmenopausal. (age 29-55)  Anticoagulants: no Anti-inflammatory: yes (Arthrotec) Antibiotics: no  Procedure:Right L5 dorsal ramus/S1-3 Lateral Branch Block  Position: Prone Start Time:  End Time:   Fluoro Time:   RN/CMA Truman Hayward, CMA Raunel Dimartino, CMA    Time 10:25am     BP 144/76     Pulse 73     Respirations 16 16    O2 Sat 95     S/S 6 6    Pain Level 8/10      D/C home with husband, patient A & O X 3, D/C instructions reviewed, and sits independently.

## 2018-07-03 NOTE — Progress Notes (Signed)
  Pt c/o severe right sided hip pain.  Scheduled for RIght THA with Dr Alvan Dame on 07/07/2018.  States that pain became more severe in the RIght hip and groin pain since Christmas.  Had family in town and was very busy. Patient denies any falls or trauma to that area.  She has been taking tramadol but this has not been effective. She is wondering whether any type of injections might be helpful.  She states that she also has pain in the right knee as well as the right side of the low back. She is walking mainly with a cane she does have a walker at home but has not been using this consistently She states that her husband has ALS  Exam General no acute distress Mood and affect appropriate She is able to ambulate with a cane she has a forward flexed posture with decreased weightbearing in the right lower extremity. No pain to palpation over the right knee she has good right knee range of motion. Hip range of motion on the right is reduced with internal extra rotation as well as extension has good flexion. She has some tenderness palpation in the lower back paraspinals as well as the lateral hip area.  Impression 1.  End-stage osteoarthritis right hip.  She has been putting off total hip arthroplasty but has now scheduled for next week.  She has had exacerbation of pain likely related to increase activity levels over the holidays.  She has not had relief with tramadol Will give 5-day supply of hydrocodone 5 mg 1 p.o. 4 times daily as needed She will have postoperative pain management with Dr. Alvan Dame If she has residual significant pain and requires more intensive pain management I'll be happy to see her again in follow-up

## 2018-07-03 NOTE — Patient Instructions (Signed)
Dr Alvan Dame will prescribe pain meds after the surgery  Your back pain may improve as your walking starts to normalize post op  Please use walker !

## 2018-07-07 ENCOUNTER — Inpatient Hospital Stay (HOSPITAL_COMMUNITY): Payer: Medicare Other

## 2018-07-07 ENCOUNTER — Inpatient Hospital Stay (HOSPITAL_COMMUNITY): Payer: Medicare Other | Admitting: Certified Registered"

## 2018-07-07 ENCOUNTER — Inpatient Hospital Stay (HOSPITAL_COMMUNITY): Payer: Medicare Other | Admitting: Physician Assistant

## 2018-07-07 ENCOUNTER — Observation Stay (HOSPITAL_COMMUNITY): Payer: Medicare Other

## 2018-07-07 ENCOUNTER — Observation Stay (HOSPITAL_COMMUNITY)
Admission: RE | Admit: 2018-07-07 | Discharge: 2018-07-09 | Disposition: A | Discharge status: Home / Self Care | Payer: Medicare Other | Source: Ambulatory Visit | Attending: Orthopedic Surgery | Admitting: Orthopedic Surgery

## 2018-07-07 ENCOUNTER — Encounter (HOSPITAL_COMMUNITY)
Admission: RE | Disposition: A | Discharge status: Home / Self Care | Payer: Self-pay | Source: Ambulatory Visit | Attending: Orthopedic Surgery

## 2018-07-07 ENCOUNTER — Encounter (HOSPITAL_COMMUNITY): Payer: Self-pay | Admitting: *Deleted

## 2018-07-07 ENCOUNTER — Other Ambulatory Visit: Payer: Self-pay

## 2018-07-07 DIAGNOSIS — Z96641 Presence of right artificial hip joint: Secondary | ICD-10-CM | POA: Diagnosis not present

## 2018-07-07 DIAGNOSIS — M1611 Unilateral primary osteoarthritis, right hip: Principal | ICD-10-CM | POA: Insufficient documentation

## 2018-07-07 DIAGNOSIS — Z96619 Presence of unspecified artificial shoulder joint: Secondary | ICD-10-CM | POA: Insufficient documentation

## 2018-07-07 DIAGNOSIS — J45909 Unspecified asthma, uncomplicated: Secondary | ICD-10-CM | POA: Diagnosis not present

## 2018-07-07 DIAGNOSIS — Z79899 Other long term (current) drug therapy: Secondary | ICD-10-CM | POA: Insufficient documentation

## 2018-07-07 DIAGNOSIS — Z419 Encounter for procedure for purposes other than remedying health state, unspecified: Secondary | ICD-10-CM

## 2018-07-07 DIAGNOSIS — E785 Hyperlipidemia, unspecified: Secondary | ICD-10-CM | POA: Diagnosis not present

## 2018-07-07 DIAGNOSIS — K219 Gastro-esophageal reflux disease without esophagitis: Secondary | ICD-10-CM | POA: Diagnosis not present

## 2018-07-07 DIAGNOSIS — I10 Essential (primary) hypertension: Secondary | ICD-10-CM | POA: Diagnosis not present

## 2018-07-07 DIAGNOSIS — Z87891 Personal history of nicotine dependence: Secondary | ICD-10-CM | POA: Diagnosis not present

## 2018-07-07 DIAGNOSIS — E039 Hypothyroidism, unspecified: Secondary | ICD-10-CM | POA: Insufficient documentation

## 2018-07-07 DIAGNOSIS — M25751 Osteophyte, right hip: Secondary | ICD-10-CM | POA: Insufficient documentation

## 2018-07-07 DIAGNOSIS — F419 Anxiety disorder, unspecified: Secondary | ICD-10-CM | POA: Insufficient documentation

## 2018-07-07 DIAGNOSIS — F329 Major depressive disorder, single episode, unspecified: Secondary | ICD-10-CM | POA: Insufficient documentation

## 2018-07-07 DIAGNOSIS — E663 Overweight: Secondary | ICD-10-CM | POA: Diagnosis present

## 2018-07-07 DIAGNOSIS — Z471 Aftercare following joint replacement surgery: Secondary | ICD-10-CM | POA: Diagnosis not present

## 2018-07-07 DIAGNOSIS — Z96649 Presence of unspecified artificial hip joint: Secondary | ICD-10-CM

## 2018-07-07 HISTORY — PX: TOTAL HIP ARTHROPLASTY: SHX124

## 2018-07-07 LAB — TYPE AND SCREEN
ABO/RH(D): A POS
Antibody Screen: NEGATIVE

## 2018-07-07 SURGERY — ARTHROPLASTY, HIP, TOTAL, ANTERIOR APPROACH
Anesthesia: General | Laterality: Right

## 2018-07-07 MED ORDER — HYDROCODONE-ACETAMINOPHEN 7.5-325 MG PO TABS: 1.0000 | ORAL_TABLET | ORAL | 0 refills | Status: DC | PRN

## 2018-07-07 MED ORDER — AMLODIPINE BESYLATE 5 MG PO TABS
5.0000 mg | ORAL_TABLET | Freq: Every day | ORAL | Status: DC
  Filled 2018-07-07 (×2): qty 1

## 2018-07-07 MED ORDER — SODIUM CHLORIDE 0.9 % IR SOLN
Status: DC | PRN
  Administered 2018-07-07: 1000 mL

## 2018-07-07 MED ORDER — DOCUSATE SODIUM 100 MG PO CAPS: 100.0000 mg | ORAL_CAPSULE | Freq: Two times a day (BID) | ORAL | 0 refills | Status: DC

## 2018-07-07 MED ORDER — EPHEDRINE SULFATE-NACL 50-0.9 MG/10ML-% IV SOSY
PREFILLED_SYRINGE | INTRAVENOUS | Status: DC | PRN
  Administered 2018-07-07 (×2): 5 mg via INTRAVENOUS

## 2018-07-07 MED ORDER — LEVOTHYROXINE SODIUM 100 MCG PO TABS
100.0000 ug | ORAL_TABLET | Freq: Every day | ORAL | Status: DC
  Administered 2018-07-08 – 2018-07-09 (×2): 100 ug via ORAL
  Filled 2018-07-07 (×2): qty 1

## 2018-07-07 MED ORDER — METHOCARBAMOL 500 MG IVPB - SIMPLE MED
500.0000 mg | Freq: Four times a day (QID) | INTRAVENOUS | Status: DC | PRN
  Administered 2018-07-07: 500 mg via INTRAVENOUS
  Filled 2018-07-07: qty 50

## 2018-07-07 MED ORDER — DIPHENHYDRAMINE HCL 12.5 MG/5ML PO ELIX: 12.5000 mg | ORAL_SOLUTION | ORAL | Status: DC | PRN

## 2018-07-07 MED ORDER — METHOCARBAMOL 500 MG IVPB - SIMPLE MED
INTRAVENOUS | Status: AC
  Filled 2018-07-07: qty 50

## 2018-07-07 MED ORDER — FENTANYL CITRATE (PF) 100 MCG/2ML IJ SOLN
INTRAMUSCULAR | Status: DC | PRN
  Administered 2018-07-07 (×2): 50 ug via INTRAVENOUS
  Administered 2018-07-07: 25 ug via INTRAVENOUS
  Administered 2018-07-07: 50 ug via INTRAVENOUS
  Administered 2018-07-07: 25 ug via INTRAVENOUS

## 2018-07-07 MED ORDER — ACETAMINOPHEN 10 MG/ML IV SOLN
INTRAVENOUS | Status: AC
  Filled 2018-07-07: qty 100

## 2018-07-07 MED ORDER — ALBUTEROL SULFATE (2.5 MG/3ML) 0.083% IN NEBU: 3.0000 mL | INHALATION_SOLUTION | Freq: Four times a day (QID) | RESPIRATORY_TRACT | Status: DC | PRN

## 2018-07-07 MED ORDER — LACTATED RINGERS IV SOLN: INTRAVENOUS | Status: DC

## 2018-07-07 MED ORDER — LACTATED RINGERS IV SOLN
INTRAVENOUS | Status: DC
  Administered 2018-07-07: 07:00:00 via INTRAVENOUS

## 2018-07-07 MED ORDER — HYDROMORPHONE HCL 1 MG/ML IJ SOLN
INTRAMUSCULAR | Status: AC
  Filled 2018-07-07: qty 2

## 2018-07-07 MED ORDER — METHOCARBAMOL 500 MG PO TABS: 500.0000 mg | ORAL_TABLET | Freq: Four times a day (QID) | ORAL | 0 refills | Status: DC | PRN

## 2018-07-07 MED ORDER — FERROUS SULFATE 325 (65 FE) MG PO TABS: 325.0000 mg | ORAL_TABLET | Freq: Three times a day (TID) | ORAL | 3 refills | Status: DC

## 2018-07-07 MED ORDER — FLUOXETINE HCL 20 MG PO CAPS
60.0000 mg | ORAL_CAPSULE | Freq: Every day | ORAL | Status: DC
  Administered 2018-07-08 – 2018-07-09 (×2): 60 mg via ORAL
  Filled 2018-07-07 (×2): qty 3

## 2018-07-07 MED ORDER — PROMETHAZINE HCL 25 MG/ML IJ SOLN: 6.2500 mg | INTRAMUSCULAR | Status: DC | PRN

## 2018-07-07 MED ORDER — ASPIRIN 81 MG PO CHEW
81.0000 mg | CHEWABLE_TABLET | Freq: Two times a day (BID) | ORAL | Status: DC
  Administered 2018-07-07 – 2018-07-09 (×4): 81 mg via ORAL
  Filled 2018-07-07 (×4): qty 1

## 2018-07-07 MED ORDER — SUGAMMADEX SODIUM 200 MG/2ML IV SOLN
INTRAVENOUS | Status: AC
  Filled 2018-07-07: qty 2

## 2018-07-07 MED ORDER — DOCUSATE SODIUM 100 MG PO CAPS
100.0000 mg | ORAL_CAPSULE | Freq: Two times a day (BID) | ORAL | Status: DC
  Administered 2018-07-07 – 2018-07-09 (×4): 100 mg via ORAL
  Filled 2018-07-07 (×4): qty 1

## 2018-07-07 MED ORDER — CEFAZOLIN SODIUM-DEXTROSE 2-4 GM/100ML-% IV SOLN
2.0000 g | Freq: Four times a day (QID) | INTRAVENOUS | Status: AC
  Administered 2018-07-07 (×2): 2 g via INTRAVENOUS
  Filled 2018-07-07 (×2): qty 100

## 2018-07-07 MED ORDER — METOCLOPRAMIDE HCL 5 MG/ML IJ SOLN: 5.0000 mg | Freq: Three times a day (TID) | INTRAMUSCULAR | Status: DC | PRN

## 2018-07-07 MED ORDER — CHLORHEXIDINE GLUCONATE 4 % EX LIQD: 60.0000 mL | Freq: Once | CUTANEOUS | Status: DC

## 2018-07-07 MED ORDER — METHOCARBAMOL 500 MG PO TABS
500.0000 mg | ORAL_TABLET | Freq: Four times a day (QID) | ORAL | Status: DC | PRN
  Administered 2018-07-07 – 2018-07-09 (×3): 500 mg via ORAL
  Filled 2018-07-07 (×3): qty 1

## 2018-07-07 MED ORDER — TRANEXAMIC ACID-NACL 1000-0.7 MG/100ML-% IV SOLN
1000.0000 mg | INTRAVENOUS | Status: AC
  Administered 2018-07-07: 1000 mg via INTRAVENOUS

## 2018-07-07 MED ORDER — ONDANSETRON HCL 4 MG PO TABS
4.0000 mg | ORAL_TABLET | Freq: Four times a day (QID) | ORAL | Status: DC | PRN
  Filled 2018-07-07: qty 1

## 2018-07-07 MED ORDER — TRANEXAMIC ACID-NACL 1000-0.7 MG/100ML-% IV SOLN
1000.0000 mg | Freq: Once | INTRAVENOUS | Status: AC
  Administered 2018-07-07: 1000 mg via INTRAVENOUS
  Filled 2018-07-07: qty 100

## 2018-07-07 MED ORDER — HYDROMORPHONE HCL 1 MG/ML IJ SOLN
0.2500 mg | INTRAMUSCULAR | Status: DC | PRN
  Administered 2018-07-07 (×4): 0.5 mg via INTRAVENOUS

## 2018-07-07 MED ORDER — PROPOFOL 10 MG/ML IV BOLUS
INTRAVENOUS | Status: AC
  Filled 2018-07-07: qty 20

## 2018-07-07 MED ORDER — FENTANYL CITRATE (PF) 100 MCG/2ML IJ SOLN
INTRAMUSCULAR | Status: AC
  Filled 2018-07-07: qty 2

## 2018-07-07 MED ORDER — BISACODYL 10 MG RE SUPP
10.0000 mg | Freq: Every day | RECTAL | Status: DC | PRN
  Administered 2018-07-09: 10 mg via RECTAL
  Filled 2018-07-07: qty 1

## 2018-07-07 MED ORDER — SUGAMMADEX SODIUM 200 MG/2ML IV SOLN
INTRAVENOUS | Status: DC | PRN
  Administered 2018-07-07: 150 mg via INTRAVENOUS
  Administered 2018-07-07: 151.6 mg via INTRAVENOUS

## 2018-07-07 MED ORDER — ROCURONIUM BROMIDE 10 MG/ML (PF) SYRINGE
PREFILLED_SYRINGE | INTRAVENOUS | Status: AC
  Filled 2018-07-07: qty 10

## 2018-07-07 MED ORDER — MAGNESIUM CITRATE PO SOLN: 1.0000 | Freq: Once | ORAL | Status: DC | PRN

## 2018-07-07 MED ORDER — FENTANYL CITRATE (PF) 100 MCG/2ML IJ SOLN
25.0000 ug | INTRAMUSCULAR | Status: DC | PRN
  Administered 2018-07-07: 50 ug via INTRAVENOUS
  Filled 2018-07-07: qty 2

## 2018-07-07 MED ORDER — METHYLPHENIDATE HCL ER 18 MG PO TB24
36.0000 mg | ORAL_TABLET | Freq: Every day | ORAL | Status: DC
  Administered 2018-07-08 – 2018-07-09 (×2): 36 mg via ORAL
  Filled 2018-07-07 (×2): qty 2

## 2018-07-07 MED ORDER — MENTHOL 3 MG MT LOZG: 1.0000 | LOZENGE | OROMUCOSAL | Status: DC | PRN

## 2018-07-07 MED ORDER — METHYLPHENIDATE HCL ER (OSM) 36 MG PO TBCR: 36.0000 mg | EXTENDED_RELEASE_TABLET | Freq: Every day | ORAL | Status: DC

## 2018-07-07 MED ORDER — PANTOPRAZOLE SODIUM 40 MG PO TBEC
40.0000 mg | DELAYED_RELEASE_TABLET | Freq: Two times a day (BID) | ORAL | Status: DC
  Administered 2018-07-07 – 2018-07-09 (×4): 40 mg via ORAL
  Filled 2018-07-07 (×4): qty 1

## 2018-07-07 MED ORDER — METOCLOPRAMIDE HCL 5 MG PO TABS
5.0000 mg | ORAL_TABLET | Freq: Three times a day (TID) | ORAL | Status: DC | PRN
  Filled 2018-07-07: qty 2

## 2018-07-07 MED ORDER — POLYETHYLENE GLYCOL 3350 17 G PO PACK
17.0000 g | PACK | Freq: Two times a day (BID) | ORAL | Status: DC
  Administered 2018-07-07 – 2018-07-09 (×4): 17 g via ORAL
  Filled 2018-07-07 (×4): qty 1

## 2018-07-07 MED ORDER — CEFAZOLIN SODIUM-DEXTROSE 2-4 GM/100ML-% IV SOLN
2.0000 g | INTRAVENOUS | Status: AC
  Administered 2018-07-07: 2 g via INTRAVENOUS

## 2018-07-07 MED ORDER — MIDAZOLAM HCL 5 MG/5ML IJ SOLN
INTRAMUSCULAR | Status: DC | PRN
  Administered 2018-07-07: 2 mg via INTRAVENOUS

## 2018-07-07 MED ORDER — DEXAMETHASONE SODIUM PHOSPHATE 10 MG/ML IJ SOLN
10.0000 mg | Freq: Once | INTRAMUSCULAR | Status: AC
  Administered 2018-07-08: 10 mg via INTRAVENOUS
  Filled 2018-07-07: qty 1

## 2018-07-07 MED ORDER — TRANEXAMIC ACID-NACL 1000-0.7 MG/100ML-% IV SOLN
INTRAVENOUS | Status: AC
  Filled 2018-07-07: qty 100

## 2018-07-07 MED ORDER — ONDANSETRON HCL 4 MG/2ML IJ SOLN: 4.0000 mg | Freq: Four times a day (QID) | INTRAMUSCULAR | Status: DC | PRN

## 2018-07-07 MED ORDER — HYDROCODONE-ACETAMINOPHEN 7.5-325 MG PO TABS
1.0000 | ORAL_TABLET | ORAL | Status: DC | PRN
  Administered 2018-07-07: 2 via ORAL
  Filled 2018-07-07: qty 2

## 2018-07-07 MED ORDER — ACETAMINOPHEN 10 MG/ML IV SOLN
INTRAVENOUS | Status: DC | PRN
  Administered 2018-07-07: 1000 mg via INTRAVENOUS

## 2018-07-07 MED ORDER — ASPIRIN 81 MG PO CHEW: 81.0000 mg | CHEWABLE_TABLET | Freq: Two times a day (BID) | ORAL | 0 refills | Status: AC

## 2018-07-07 MED ORDER — LIDOCAINE 2% (20 MG/ML) 5 ML SYRINGE
INTRAMUSCULAR | Status: DC | PRN
  Administered 2018-07-07: 80 mg via INTRAVENOUS

## 2018-07-07 MED ORDER — AMLODIPINE BESYLATE-VALSARTAN 5-160 MG PO TABS: 1.0000 | ORAL_TABLET | Freq: Every day | ORAL | Status: DC

## 2018-07-07 MED ORDER — BUPROPION HCL ER (XL) 300 MG PO TB24
300.0000 mg | ORAL_TABLET | Freq: Every day | ORAL | Status: DC
  Administered 2018-07-08 – 2018-07-09 (×2): 300 mg via ORAL
  Filled 2018-07-07 (×2): qty 1

## 2018-07-07 MED ORDER — MIDAZOLAM HCL 2 MG/2ML IJ SOLN
INTRAMUSCULAR | Status: AC
  Filled 2018-07-07: qty 2

## 2018-07-07 MED ORDER — DEXAMETHASONE SODIUM PHOSPHATE 10 MG/ML IJ SOLN
INTRAMUSCULAR | Status: AC
  Filled 2018-07-07: qty 1

## 2018-07-07 MED ORDER — ROCURONIUM BROMIDE 10 MG/ML (PF) SYRINGE
PREFILLED_SYRINGE | INTRAVENOUS | Status: DC | PRN
  Administered 2018-07-07: 10 mg via INTRAVENOUS
  Administered 2018-07-07: 50 mg via INTRAVENOUS

## 2018-07-07 MED ORDER — ALPRAZOLAM 0.5 MG PO TABS: 0.5000 mg | ORAL_TABLET | Freq: Two times a day (BID) | ORAL | Status: DC | PRN

## 2018-07-07 MED ORDER — PHENOL 1.4 % MT LIQD: 1.0000 | OROMUCOSAL | Status: DC | PRN

## 2018-07-07 MED ORDER — LAMOTRIGINE 100 MG PO TABS
100.0000 mg | ORAL_TABLET | Freq: Three times a day (TID) | ORAL | Status: DC
  Administered 2018-07-07 – 2018-07-09 (×6): 100 mg via ORAL
  Filled 2018-07-07 (×6): qty 1

## 2018-07-07 MED ORDER — CELECOXIB 200 MG PO CAPS
200.0000 mg | ORAL_CAPSULE | Freq: Two times a day (BID) | ORAL | Status: DC
  Administered 2018-07-07 – 2018-07-09 (×4): 200 mg via ORAL
  Filled 2018-07-07 (×4): qty 1

## 2018-07-07 MED ORDER — MEPERIDINE HCL 50 MG/ML IJ SOLN: 6.2500 mg | INTRAMUSCULAR | Status: DC | PRN

## 2018-07-07 MED ORDER — ONDANSETRON HCL 4 MG/2ML IJ SOLN
INTRAMUSCULAR | Status: AC
  Filled 2018-07-07: qty 2

## 2018-07-07 MED ORDER — CEFAZOLIN SODIUM-DEXTROSE 2-4 GM/100ML-% IV SOLN
INTRAVENOUS | Status: AC
  Filled 2018-07-07: qty 100

## 2018-07-07 MED ORDER — FERROUS SULFATE 325 (65 FE) MG PO TABS
325.0000 mg | ORAL_TABLET | Freq: Three times a day (TID) | ORAL | Status: DC
  Administered 2018-07-07 – 2018-07-09 (×6): 325 mg via ORAL
  Filled 2018-07-07 (×6): qty 1

## 2018-07-07 MED ORDER — EPHEDRINE 5 MG/ML INJ
INTRAVENOUS | Status: AC
  Filled 2018-07-07: qty 10

## 2018-07-07 MED ORDER — POLYETHYLENE GLYCOL 3350 17 G PO PACK: 17.0000 g | PACK | Freq: Two times a day (BID) | ORAL | 0 refills | Status: DC

## 2018-07-07 MED ORDER — HYDROCODONE-ACETAMINOPHEN 5-325 MG PO TABS
1.0000 | ORAL_TABLET | ORAL | Status: DC | PRN
  Administered 2018-07-07 – 2018-07-09 (×8): 2 via ORAL
  Administered 2018-07-09: 1 via ORAL
  Filled 2018-07-07 (×9): qty 2

## 2018-07-07 MED ORDER — ONDANSETRON HCL 4 MG/2ML IJ SOLN
INTRAMUSCULAR | Status: DC | PRN
  Administered 2018-07-07: 4 mg via INTRAVENOUS

## 2018-07-07 MED ORDER — PROPOFOL 10 MG/ML IV BOLUS
INTRAVENOUS | Status: DC | PRN
  Administered 2018-07-07: 140 mg via INTRAVENOUS

## 2018-07-07 MED ORDER — ACETAMINOPHEN 325 MG PO TABS: 325.0000 mg | ORAL_TABLET | Freq: Four times a day (QID) | ORAL | Status: DC | PRN

## 2018-07-07 MED ORDER — ALUM & MAG HYDROXIDE-SIMETH 200-200-20 MG/5ML PO SUSP: 15.0000 mL | ORAL | Status: DC | PRN

## 2018-07-07 MED ORDER — LIDOCAINE 2% (20 MG/ML) 5 ML SYRINGE
INTRAMUSCULAR | Status: AC
  Filled 2018-07-07: qty 5

## 2018-07-07 MED ORDER — IRBESARTAN 150 MG PO TABS
150.0000 mg | ORAL_TABLET | Freq: Every day | ORAL | Status: DC
  Filled 2018-07-07 (×2): qty 1

## 2018-07-07 MED ORDER — DEXAMETHASONE SODIUM PHOSPHATE 10 MG/ML IJ SOLN
10.0000 mg | Freq: Once | INTRAMUSCULAR | Status: AC
  Administered 2018-07-07: 8 mg via INTRAVENOUS

## 2018-07-07 MED ORDER — LIP MEDEX EX OINT
TOPICAL_OINTMENT | CUTANEOUS | Status: AC
  Administered 2018-07-07: 15:00:00
  Filled 2018-07-07: qty 7

## 2018-07-07 MED ORDER — SODIUM CHLORIDE 0.9 % IV SOLN
INTRAVENOUS | Status: DC
  Administered 2018-07-07 – 2018-07-08 (×2): via INTRAVENOUS

## 2018-07-07 SURGICAL SUPPLY — 42 items
BAG DECANTER FOR FLEXI CONT (MISCELLANEOUS) IMPLANT
BAG ZIPLOCK 12X15 (MISCELLANEOUS) IMPLANT
BLADE SAG 18X100X1.27 (BLADE) ×2 IMPLANT
BLADE SURG SZ10 CARB STEEL (BLADE) ×4 IMPLANT
COVER PERINEAL POST (MISCELLANEOUS) ×2 IMPLANT
COVER SURGICAL LIGHT HANDLE (MISCELLANEOUS) ×2 IMPLANT
COVER WAND RF STERILE (DRAPES) IMPLANT
CUP ACETBLR 48 OD SECTOR II (Hips) ×1 IMPLANT
DERMABOND ADVANCED (GAUZE/BANDAGES/DRESSINGS) ×1
DERMABOND ADVANCED .7 DNX12 (GAUZE/BANDAGES/DRESSINGS) ×1 IMPLANT
DRAPE STERI IOBAN 125X83 (DRAPES) ×2 IMPLANT
DRAPE U-SHAPE 47X51 STRL (DRAPES) ×4 IMPLANT
DRESSING AQUACEL AG SP 3.5X10 (GAUZE/BANDAGES/DRESSINGS) ×1 IMPLANT
DRSG AQUACEL AG ADV 3.5X10 (GAUZE/BANDAGES/DRESSINGS) ×1 IMPLANT
DRSG AQUACEL AG SP 3.5X10 (GAUZE/BANDAGES/DRESSINGS) ×2
DURAPREP 26ML APPLICATOR (WOUND CARE) ×2 IMPLANT
ELECT REM PT RETURN 15FT ADLT (MISCELLANEOUS) ×2 IMPLANT
ELIMINATOR HOLE APEX DEPUY (Hips) ×1 IMPLANT
GLOVE BIOGEL M STRL SZ7.5 (GLOVE) IMPLANT
GLOVE BIOGEL PI IND STRL 7.5 (GLOVE) ×1 IMPLANT
GLOVE BIOGEL PI IND STRL 8.5 (GLOVE) ×1 IMPLANT
GLOVE BIOGEL PI INDICATOR 7.5 (GLOVE) ×1
GLOVE BIOGEL PI INDICATOR 8.5 (GLOVE) ×1
GLOVE ECLIPSE 8.0 STRL XLNG CF (GLOVE) ×4 IMPLANT
GLOVE ORTHO TXT STRL SZ7.5 (GLOVE) ×2 IMPLANT
GOWN STRL REUS W/TWL 2XL LVL3 (GOWN DISPOSABLE) ×2 IMPLANT
GOWN STRL REUS W/TWL LRG LVL3 (GOWN DISPOSABLE) ×2 IMPLANT
HEAD FEMORAL 32 CERAMIC (Hips) ×1 IMPLANT
HOLDER FOLEY CATH W/STRAP (MISCELLANEOUS) ×2 IMPLANT
PACK ANTERIOR HIP CUSTOM (KITS) ×2 IMPLANT
PINN ALTRX NEUT ID X OD 32X48 ×1 IMPLANT
SCREW 6.5MMX35MM (Screw) ×1 IMPLANT
STEM FEMORAL SZ6 HIGH ACTIS (Stem) ×1 IMPLANT
SUT MNCRL AB 4-0 PS2 18 (SUTURE) ×2 IMPLANT
SUT STRATAFIX 0 PDS 27 VIOLET (SUTURE) ×2
SUT VIC AB 1 CT1 36 (SUTURE) ×6 IMPLANT
SUT VIC AB 2-0 CT1 27 (SUTURE) ×3
SUT VIC AB 2-0 CT1 TAPERPNT 27 (SUTURE) ×2 IMPLANT
SUTURE STRATFX 0 PDS 27 VIOLET (SUTURE) ×1 IMPLANT
TRAY FOLEY MTR SLVR 16FR STAT (SET/KITS/TRAYS/PACK) IMPLANT
WATER STERILE IRR 1000ML POUR (IV SOLUTION) ×2 IMPLANT
YANKAUER SUCT BULB TIP 10FT TU (MISCELLANEOUS) IMPLANT

## 2018-07-07 NOTE — Evaluation (Signed)
Physical Therapy Evaluation Patient Details Name: [name] MRN: [id number] DOB: [birth year] Today's Date: 07/07/2018   History of Present Illness  admitted for right direct ndterior THA, recently had a fall, H/O HTN ADD depression, L hip fracture, multiple back and shoulder surgeries  Clinical Impression  The patient mobilized and took a few steps. Really got more sleepy after IV pain meds . Assisted back into bed. Pt admitted with above diagnosis. Pt currently with functional limitations due to the deficits listed below (see PT Problem List).  Pt will benefit from skilled PT to increase their independence and safety with mobility to allow discharge to the venue listed below.       Follow Up Recommendations Follow surgeon's recommendation for DC plan and follow-up therapies    Equipment Recommendations  (need to check about 2 wheeled Rw)    Recommendations for Other Services       Precautions / Restrictions Precautions Precautions: Fall      Mobility  Bed Mobility Overal bed mobility: Needs Assistance Bed Mobility: Supine to Sit;Sit to Supine     Supine to sit: Min assist Sit to supine: Min assist   General bed mobility comments: assist with legs and trunk, trunk stiff  Transfers Overall transfer level: Needs assistance Equipment used: Rolling walker (2 wheeled) Transfers: Sit to/from Stand Sit to Stand: Min assist         General transfer comment: cue sfor hand  position  Ambulation/Gait Ambulation/Gait assistance: Min assist   Assistive device: Rolling walker (2 wheeled) Gait Pattern/deviations: Step-to pattern     General Gait Details: side steps along the bed.   Stairs            Wheelchair Mobility    Modified Rankin (Stroke Patients Only)       Balance                                             Pertinent Vitals/Pain Pain Assessment: 0-10 Pain Score: 5  Pain Descriptors / Indicators: Aching;Cramping Pain  Intervention(s): Premedicated before session;Monitored during session;Repositioned;Limited activity within patient's tolerance;Ice applied    Home Living Family/patient expects to be discharged to:: Private residence Living Arrangements: Spouse/significant other;Children Available Help at Discharge: Family;Available 24 hours/day Type of Home: House Home Access: Stairs to enter Entrance Stairs-Rails: None Entrance Stairs-Number of Steps: 2 Home Layout: One level Home Equipment: Walker - 4 wheels;Cane - single point Additional Comments: Husband is present with pt 24/7, however, has been recently diagnosed with ALS. Daughter checks in frequently on pt.     Prior Function Level of Independence: Independent with assistive device(s)         Comments: Uses cane for ambulation normally;      Hand Dominance        Extremity/Trunk Assessment   Upper Extremity Assessment Upper Extremity Assessment: RUE deficits/detail;LUE deficits/detail RUE Deficits / Details: decerased flexion LUE Deficits / Details: same    Lower Extremity Assessment Lower Extremity Assessment: RLE deficits/detail RLE Deficits / Details: flexed leg to 60 in supine    Cervical / Trunk Assessment Cervical / Trunk Assessment: Normal  Communication   Communication: No difficulties  Cognition Arousal/Alertness: Lethargic;Suspect due to medications;Awake/alert Behavior During Therapy: WFL for tasks assessed/performed Overall Cognitive Status: Within Functional Limits for tasks assessed  General Comments: more sleepy near end, IVpremedicated      General Comments      Exercises Total Joint Exercises Heel Slides: AROM;10 reps;Supine   Assessment/Plan    PT Assessment Patient needs continued PT services  PT Problem List Decreased strength;Decreased range of motion;Decreased activity tolerance;Decreased mobility;Decreased knowledge of precautions;Decreased  safety awareness;Decreased knowledge of use of DME;Pain       PT Treatment Interventions DME instruction;Therapeutic exercise;Gait training;Stair training;Functional mobility training;Therapeutic activities;Patient/family education    PT Goals (Current goals can be found in the Care Plan section)  Acute Rehab PT Goals Patient Stated Goal: to go home PT Goal Formulation: With patient Time For Goal Achievement: 07/11/18 Potential to Achieve Goals: Good    Frequency 7X/week   Barriers to discharge        Co-evaluation               AM-PAC PT "6 Clicks" Mobility  Outcome Measure Help needed turning from your back to your side while in a flat bed without using bedrails?: A Little Help needed moving from lying on your back to sitting on the side of a flat bed without using bedrails?: A Little Help needed moving to and from a bed to a chair (including a wheelchair)?: A Lot Help needed standing up from a chair using your arms (e.g., wheelchair or bedside chair)?: A Lot Help needed to walk in hospital room?: A Lot Help needed climbing 3-5 steps with a railing? : A Lot 6 Click Score: 14    End of Session   Activity Tolerance: Patient limited by fatigue Patient left: in bed;with call bell/phone within reach;with bed alarm set Nurse Communication: Mobility status PT Visit Diagnosis: Unsteadiness on feet (R26.81);Pain Pain - Right/Left: Right Pain - part of body: Hip    Time: 2111-5520 PT Time Calculation (min) (ACUTE ONLY): 17 min   Charges:   PT Evaluation $PT Eval Low Complexity: Reserve PT Acute Rehabilitation Services Pager [phone] Office [phone]    Claretha Cooper 07/07/2018, 5:41 PM

## 2018-07-07 NOTE — Transfer of Care (Signed)
Immediate Anesthesia Transfer of Care Note  Patient: [name]  Procedure(s) Performed: TOTAL HIP ARTHROPLASTY ANTERIOR APPROACH (Right )  Patient Location: PACU  Anesthesia Type:General  Level of Consciousness: awake, alert  and oriented  Airway & Oxygen Therapy: Patient Spontanous Breathing and Patient connected to face mask oxygen  Post-op Assessment: Report given to RN, Post -op Vital signs reviewed and stable and Patient moving all extremities  Post vital signs: Reviewed and stable  Last Vitals:  Vitals Value Taken Time  BP 169/74 07/07/2018 10:35 AM  Temp    Pulse 71 07/07/2018 10:40 AM  Resp 16 07/07/2018 10:40 AM  SpO2 100 % 07/07/2018 10:40 AM  Vitals shown include unvalidated device data.  Last Pain:  Vitals:   07/07/18 0635  TempSrc: Oral         Complications: No apparent anesthesia complications

## 2018-07-07 NOTE — Anesthesia Procedure Notes (Signed)
Procedure Name: Intubation Date/Time: 07/07/2018 8:32 AM Performed by: Victoriano Lain, CRNA Pre-anesthesia Checklist: Patient identified, Emergency Drugs available, Suction available, Timeout performed and Patient being monitored Patient Re-evaluated:Patient Re-evaluated prior to induction Oxygen Delivery Method: Circle system utilized Preoxygenation: Pre-oxygenation with 100% oxygen Induction Type: IV induction Ventilation: Mask ventilation without difficulty Laryngoscope Size: Mac and 3 Grade View: Grade I Tube type: Oral Tube size: 7.5 mm Number of attempts: 1 Airway Equipment and Method: Stylet Placement Confirmation: ETT inserted through vocal cords under direct vision,  positive ETCO2 and breath sounds checked- equal and bilateral Secured at: 21 cm Tube secured with: Tape Dental Injury: Teeth and Oropharynx as per pre-operative assessment  Comments: Smooth IV induction. DL x 1 with MAC 3. Grade 1 view. Upon DL, trace amount of bile colored fluid noted at back of pt's throat. Dr Smith Robert aware. Pt had taken Prilosec this am and last night. + ETCO2, BBS = ATOI. ETT secured at 21 cm at the lip.

## 2018-07-07 NOTE — Anesthesia Postprocedure Evaluation (Signed)
Anesthesia Post Note  Patient: [name]  Procedure(s) Performed: TOTAL HIP ARTHROPLASTY ANTERIOR APPROACH (Right )     Patient location during evaluation: PACU Anesthesia Type: General Level of consciousness: awake and alert Vital Signs Assessment: post-procedure vital signs reviewed and stable Respiratory status: spontaneous breathing, nonlabored ventilation, respiratory function stable and patient connected to nasal cannula oxygen Cardiovascular status: blood pressure returned to baseline and stable Postop Assessment: no apparent nausea or vomiting Anesthetic complications: no Comments: Actively treating pain.     Last Vitals:  Vitals:   07/07/18 1327 07/07/18 1426  BP: 112/64 112/62  Pulse: 78 76  Resp: 18 14  Temp: 36.4 C 36.7 C  SpO2: 97% 98%                 Effie Berkshire

## 2018-07-07 NOTE — Discharge Instructions (Signed)

## 2018-07-07 NOTE — Interval H&P Note (Signed)
History and Physical Interval Note:  07/07/2018 7:12 AM  [name]  has presented today for surgery, with the diagnosis of Right hip osteoarthritis  The various methods of treatment have been discussed with the patient and family. After consideration of risks, benefits and other options for treatment, the patient has consented to  Procedure(s) with comments: TOTAL HIP ARTHROPLASTY ANTERIOR APPROACH (Right) - 70 mins as a surgical intervention .  The patient's history has been reviewed, patient examined, no change in status, stable for surgery.  I have reviewed the patient's chart and labs.  Questions were answered to the patient's satisfaction.     Mauri Pole

## 2018-07-07 NOTE — Anesthesia Preprocedure Evaluation (Addendum)
Anesthesia Evaluation  Patient identified by MRN, date of birth, ID band Patient awake    Reviewed: Allergy & Precautions, NPO status , Patient's Chart, lab work & pertinent test results  Airway Mallampati: II  TM Distance: >3 FB Neck ROM: Full    Dental  (+) Chipped,    Pulmonary asthma , former smoker,    breath sounds clear to auscultation       Cardiovascular hypertension, Pt. on medications  Rhythm:Regular Rate:Normal     Neuro/Psych Anxiety Depression    GI/Hepatic hiatal hernia, PUD, GERD  Medicated,  Endo/Other  Hypothyroidism   Renal/GU      Musculoskeletal  (+) Arthritis , Osteoarthritis,    Abdominal Normal abdominal exam  (+)   Peds  Hematology   Anesthesia Other Findings - HLD  Reproductive/Obstetrics                            Lab Results  Component Value Date   WBC 8.1 07/02/2018   HGB 12.3 07/02/2018   HCT 36.7 07/02/2018   MCV 96.8 07/02/2018   PLT 237 07/02/2018   Lab Results  Component Value Date   INR 1.01 06/04/2018   INR 1.0 11/25/2008   INR 1.0 01/15/2008   Lab Results  Component Value Date   CREATININE 0.83 07/02/2018   BUN 19 07/02/2018   NA 144 07/02/2018   K 4.6 07/02/2018   CL 110 07/02/2018   CO2 27 07/02/2018   EKG: normal sinus rhythm.  Anesthesia Physical Anesthesia Plan  ASA: II  Anesthesia Plan: General   Post-op Pain Management:    Induction: Intravenous  PONV Risk Score and Plan: 4 or greater and Propofol infusion, Ondansetron, Dexamethasone and Treatment may vary due to age or medical condition  Airway Management Planned: Oral ETT  Additional Equipment: None  Intra-op Plan:   Post-operative Plan: Extubation in OR  Informed Consent: I have reviewed the patients History and Physical, chart, labs and discussed the procedure including the risks, benefits and alternatives for the proposed anesthesia with the patient or  authorized representative who has indicated his/her understanding and acceptance.   Dental advisory given  Plan Discussed with: CRNA  Anesthesia Plan Comments:        Anesthesia Quick Evaluation

## 2018-07-07 NOTE — Op Note (Signed)
NAME:  [name]                ACCOUNT NO.: [id number]      MEDICAL RECORD NO.: [id number]      FACILITY:  [facility]      PHYSICIAN:  Mauri Pole  DATE OF BIRTH:  05/14/1946     DATE OF PROCEDURE:  07/07/2018                                 OPERATIVE REPORT         PREOPERATIVE DIAGNOSIS: Right  hip osteoarthritis.      POSTOPERATIVE DIAGNOSIS:  Right hip osteoarthritis.      PROCEDURE:  Right total hip replacement through an anterior approach   utilizing DePuy THR system, component size 73m pinnacle cup, a size 32+4 neutral   Altrex liner, a size 6 Hi Actis stem with a 32+1 delta ceramic   ball.      SURGEON:  MPietro Cassis OAlvan Dame M.D.      ASSISTANT:  BNehemiah Massed PA-C     ANESTHESIA:  General.      SPECIMENS:  None.      COMPLICATIONS:  None.      BLOOD LOSS:  450 cc     DRAINS:  None.      INDICATION OF THE PROCEDURE:  Tammy ELIZARRARAZis a 73y.o. female who had   presented to office for evaluation of right hip pain.  Radiographs revealed   progressive degenerative changes with bone-on-bone   articulation of the  hip joint, including subchondral cystic changes and osteophytes.  The patient had painful limited range of   motion significantly affecting their overall quality of life and function.  The patient was failing to    respond to conservative measures including medications and/or injections and activity modification and at this point was ready   to proceed with more definitive measures.  Consent was obtained for   benefit of pain relief.  Specific risks of infection, DVT, component   failure, dislocation, neurovascular injury, and need for revision surgery were reviewed in the office as well discussion of   the anterior versus posterior approach were reviewed.     PROCEDURE IN DETAIL:  The patient was brought to operative theater.   Once adequate anesthesia, preoperative antibiotics, 2 gm of Acnef, 1 gm of Tranexamic Acid, and 10  mg of Decadron were administered, the patient was positioned supine on the OAtmos Energytable.  Once the patient was safely positioned with adequate padding of boney prominences we predraped out the hip, and used fluoroscopy to confirm orientation of the pelvis.      The right hip was then prepped and draped from proximal iliac crest to   mid thigh with a shower curtain technique.      Time-out was performed identifying the patient, planned procedure, and the appropriate extremity.     An incision was then made 2 cm lateral to the   anterior superior iliac spine extending over the orientation of the   tensor fascia lata muscle and sharp dissection was carried down to the   fascia of the muscle.      The fascia was then incised.  The muscle belly was identified and swept   laterally and retractor placed along the superior neck.  Following   cauterization of the circumflex vessels and removing some pericapsular  fat, a second cobra retractor was placed on the inferior neck.  A T-capsulotomy was made along the line of the   superior neck to the trochanteric fossa, then extended proximally and   distally.  Tag sutures were placed and the retractors were then placed   intracapsular.  We then identified the trochanteric fossa and   orientation of my neck cut and then made a neck osteotomy with the femur on traction.  The femoral   head was removed without difficulty or complication.  Traction was let   off and retractors were placed posterior and anterior around the   acetabulum.      The labrum and foveal tissue were debrided.  I began reaming with a 44 mm   reamer and reamed up to 47 mm reamer with good bony bed preparation and a 48 mm  cup was chosen.  The final 48 mm Pinnacle cup was then impacted under fluoroscopy to confirm the depth of penetration and orientation with respect to   Abduction and forward flexion.  A screw was placed into the ilium followed by the hole eliminator.  The final    32+4 neutral Altrex liner was impacted with good visualized rim fit.  The cup was positioned anatomically within the acetabular portion of the pelvis.      At this point, the femur was rolled to 100 degrees.  Further capsule was   released off the inferior aspect of the femoral neck.  I then   released the superior capsule proximally.  With the leg in a neutral position the hook was placed laterally   along the femur under the vastus lateralis origin and elevated manually and then held in position using the hook attachment on the bed.  The leg was then extended and adducted with the leg rolled to 100   degrees of external rotation.  Retractors were placed along the medial calcar and posteriorly over the greater trochanter.  Once the proximal femur was fully   exposed, I used a box osteotome to set orientation.  I then began   broaching with the starting chili pepper broach and passed this by hand and then broached up to 6.  With the 6 broach in place I chose a high offset neck and did several trial reductions.  The offset was appropriate, leg lengths   appeared to be equal best matched with the +1 head ball trial confirmed radiographically.   Given these findings, I went ahead and dislocated the hip, repositioned all   retractors and positioned the right hip in the extended and abducted position.  The final 6 Hi Actis femoral stem was   chosen and it was impacted down to the level of neck cut.  Based on this   and the trial reductions, a final 32+1 delta ceramic ball was chosen and   impacted onto a clean and dry trunnion, and the hip was reduced.  The   hip had been irrigated throughout the case again at this point.  I did   reapproximate the superior capsular leaflet to the anterior leaflet   using #1 Vicryl.  The fascia of the   tensor fascia lata muscle was then reapproximated using #1 Vicryl and #0 Stratafix sutures.  The   remaining wound was closed with 2-0 Vicryl and running 4-0  Monocryl.   The hip was cleaned, dried, and dressed sterilely using Dermabond and   Aquacel dressing.  The patient was then brought   to recovery room  in stable condition tolerating the procedure well.    Nehemiah Massed, PA-C was present for the entirety of the case involved from   preoperative positioning, perioperative retractor management, general   facilitation of the case, as well as primary wound closure as assistant.            Pietro Cassis Alvan Dame, M.D.        07/07/2018 10:01 AM

## 2018-07-07 NOTE — Care Plan (Signed)
Ortho Bundle Case Management Note  Patient Details  Name: [name] MRN: [id number] Date of Birth: January 30, 1946  R THA scheduled on 07-07-2018 DCP:  Home with spouse.  1 story home with 2 ste. DME:  No needs.  Has a RW and 3-in-1.  PT:  HEP                 DME Arranged:  N/A DME Agency:  NA  HH Arranged:  NA HH Agency:  NA  Additional Comments: Please contact me with any questions of if this plan should need to change.  Marianne Sofia, RN,CCM [facility]  [phone] 07/07/2018, 8:30 AM

## 2018-07-08 DIAGNOSIS — E663 Overweight: Secondary | ICD-10-CM | POA: Diagnosis present

## 2018-07-08 DIAGNOSIS — F329 Major depressive disorder, single episode, unspecified: Secondary | ICD-10-CM | POA: Diagnosis not present

## 2018-07-08 DIAGNOSIS — M25751 Osteophyte, right hip: Secondary | ICD-10-CM | POA: Diagnosis not present

## 2018-07-08 DIAGNOSIS — M1611 Unilateral primary osteoarthritis, right hip: Secondary | ICD-10-CM | POA: Diagnosis not present

## 2018-07-08 DIAGNOSIS — Z96619 Presence of unspecified artificial shoulder joint: Secondary | ICD-10-CM | POA: Diagnosis not present

## 2018-07-08 DIAGNOSIS — I10 Essential (primary) hypertension: Secondary | ICD-10-CM | POA: Diagnosis not present

## 2018-07-08 DIAGNOSIS — F419 Anxiety disorder, unspecified: Secondary | ICD-10-CM | POA: Diagnosis not present

## 2018-07-08 LAB — CBC
HCT: 30.5 % — ABNORMAL LOW (ref 36.0–46.0)
Hemoglobin: 9.5 g/dL — ABNORMAL LOW (ref 12.0–15.0)
MCH: 29.8 pg (ref 26.0–34.0)
MCHC: 31.1 g/dL (ref 30.0–36.0)
MCV: 95.6 fL (ref 80.0–100.0)
Platelets: 163 10*3/uL (ref 150–400)
RBC: 3.19 MIL/uL — ABNORMAL LOW (ref 3.87–5.11)
RDW: 14.3 % (ref 11.5–15.5)
WBC: 12.4 10*3/uL — ABNORMAL HIGH (ref 4.0–10.5)
nRBC: 0 % (ref 0.0–0.2)

## 2018-07-08 LAB — BASIC METABOLIC PANEL
Anion gap: 5 (ref 5–15)
BUN: 13 mg/dL (ref 8–23)
CO2: 25 mmol/L (ref 22–32)
Calcium: 8.6 mg/dL — ABNORMAL LOW (ref 8.9–10.3)
Chloride: 109 mmol/L (ref 98–111)
Creatinine, Ser: 0.76 mg/dL (ref 0.44–1.00)
GFR calc Af Amer: >60 mL/min (ref >60)
GFR calc non Af Amer: >60 mL/min (ref >60)
Glucose, Bld: 111 mg/dL — ABNORMAL HIGH (ref 70–99)
Potassium: 4.3 mmol/L (ref 3.5–5.1)
Sodium: 139 mmol/L (ref 135–145)

## 2018-07-08 NOTE — Care Management Obs Status (Signed)
[place] NOTIFICATION   Patient Details  Name: [name] MRN: [id number] Date of Birth: 02/11/1946   Medicare Observation Status Notification Given:  Yes    Guadalupe Maple, RN 07/08/2018, 10:55 AM

## 2018-07-08 NOTE — Progress Notes (Signed)
Physical Therapy Treatment Patient Details Name: [name] MRN: [id number] DOB: 01/12/1946 Today's Date: 07/08/2018    History of Present Illness admitted for right direct ndterior THA, recently had a fall, H/O HTN ADD depression, L hip fracture, multiple back and shoulder surgeries    PT Comments    Patient is very fatigued. Progressing in mobility. Plans DC tomorrow.  Continue PT/  Follow Up Recommendations  Follow surgeon's recommendation for DC plan and follow-up therapies(HEP in room)     Equipment Recommendations       Recommendations for Other Services       Precautions / Restrictions Precautions Precautions: Fall Restrictions Weight Bearing Restrictions: No    Mobility  Bed Mobility   Bed Mobility: Supine to Sit;Sit to Supine     Supine to sit: Min assist;Min guard Sit to supine: Min assist   General bed mobility comments: Practicing onto a high bed, instructed to keep feet together to move to bed edge and sit up. Attempted use of belt , not effective: use of feet crossed, not effective. PPatient required assist to get legs onto bed.  Transfers Overall transfer level: Needs assistance Equipment used: Rolling walker (2 wheeled) Transfers: Sit to/from Stand              Ambulation/Gait Ambulation/Gait assistance: Min guard Gait Distance (Feet): 100 Feet Assistive device: Rolling walker (2 wheeled) Gait Pattern/deviations: Step-to pattern;Step-through pattern     General Gait Details: cues for sequence and posture, tends to look at feet.   Stairs             Wheelchair Mobility    Modified Rankin (Stroke Patients Only)       Balance                                            Cognition Arousal/Alertness: Awake/alert                                            Exercises Total Joint Exercises Ankle Circles/Pumps: AROM;Both;10 reps;Supine Quad Sets: AROM;Both;10 reps;Supine Short Arc  Quad: AROM;Right;10 reps;Supine Heel Slides: AAROM;Right;10 reps;Supine Hip ABduction/ADduction: AAROM;Right;10 reps;Supine    General Comments        Pertinent Vitals/Pain Pain Score: 4  Pain Descriptors / Indicators: Aching;Cramping Pain Intervention(s): Monitored during session;Premedicated before session;Ice applied;Repositioned    Home Living                      Prior Function            PT Goals (current goals can now be found in the care plan section) Progress towards PT goals: Progressing toward goals    Frequency    7X/week      PT Plan Current plan remains appropriate    Co-evaluation              AM-PAC PT "6 Clicks" Mobility   Outcome Measure  Help needed turning from your back to your side while in a flat bed without using bedrails?: A Little Help needed moving from lying on your back to sitting on the side of a flat bed without using bedrails?: A Little Help needed moving to and from a bed to a chair (including a wheelchair)?: A Little Help  needed standing up from a chair using your arms (e.g., wheelchair or bedside chair)?: A Little Help needed to walk in hospital room?: A Little Help needed climbing 3-5 steps with a railing? : A Lot 6 Click Score: 17    End of Session Equipment Utilized During Treatment: Gait belt Activity Tolerance: Patient tolerated treatment well Patient left: in bed;with call bell/phone within reach;with bed alarm set Nurse Communication: Mobility status PT Visit Diagnosis: Unsteadiness on feet (R26.81);Pain Pain - Right/Left: Right Pain - part of body: Hip     Time: 0131-4388 PT Time Calculation (min) (ACUTE ONLY): 35 min  Charges:  $Gait Training: 8-22 mins $Therapeutic Exercise: 8-22 mins                     Tresa Endo PT Acute Rehabilitation Services Pager [phone] Office [phone]    Claretha Cooper 07/08/2018, 11:29 AM

## 2018-07-08 NOTE — Plan of Care (Signed)
  Problem: Clinical Measurements: Goal: Ability to maintain clinical measurements within normal limits will improve Outcome: Progressing Goal: Will remain free from infection Outcome: Progressing Goal: Respiratory complications will improve Outcome: Progressing Goal: Cardiovascular complication will be avoided Outcome: Progressing   Problem: Activity: Goal: Risk for activity intolerance will decrease Outcome: Progressing   Problem: Coping: Goal: Level of anxiety will decrease Outcome: Progressing   Problem: Pain Managment: Goal: General experience of comfort will improve Outcome: Progressing   

## 2018-07-08 NOTE — Progress Notes (Signed)
     Subjective: 1 Day Post-Op Procedure(s) (LRB): TOTAL HIP ARTHROPLASTY ANTERIOR APPROACH (Right)   Patient reports pain as mild, pain controlled.  No events throughout the night. States that she hasn't really had a good session with PT, but knows they will come this morning. We have discussed that if she does well with PT that she will be able to d/c home, otherwise if she stays it will be for pain control and need for inpatient therapy to meet goal of being discharged home safely with family/caregiver.    Objective:   VITALS:   Vitals:   07/08/18 0200 07/08/18 0602  BP: (!) 101/59 (!) 103/59  Pulse: 72 71  Resp: 16 18  Temp: 98.1 F (36.7 C) 98.1 F (36.7 C)  SpO2: 94% 96%    Dorsiflexion/Plantar flexion intact Incision: dressing C/D/I No cellulitis present Compartment soft  LABS Recent Labs    07/08/18 0424  HGB 9.5*  HCT 30.5*  WBC 12.4*  PLT 163    Recent Labs    07/08/18 0424  NA 139  K 4.3  BUN 13  CREATININE 0.76  GLUCOSE 111*     Assessment/Plan: 1 Day Post-Op Procedure(s) (LRB): TOTAL HIP ARTHROPLASTY ANTERIOR APPROACH (Right) Foley cath d/c'ed Advance diet Up with therapy D/C IV fluids Discharge home, if she does well with PT Follow up in 2 weeks at [facility] ([place]). Follow up with OLIN,Keonia Pasko D in 2 weeks.  Contact information:  [facility]) [address], [place] [phone]    Overweight (BMI 25-29.9) Estimated body mass index is 27.81 kg/m as calculated from the following:   Height as of this encounter: 5\' 5"  (1.651 m).   Weight as of this encounter: 75.8 kg. Patient also counseled that weight may inhibit the healing process Patient counseled that losing weight will help with future health issues        West Pugh. Lien Lyman   PAC  07/08/2018, 8:35 AM

## 2018-07-08 NOTE — Progress Notes (Signed)
Physical Therapy Treatment Patient Details Name: [name] MRN: [id number] DOB: [birth year] Today's Date: 07/08/2018    History of Present Illness admitted for right direct ndterior THA, recently had a fall, H/O HTN ADD depression, L hip fracture, multiple back and shoulder surgeries    PT Comments    pATIENTR IS PROGRESSING. iMPROVED ABILITY TO ADVANCE THE RIGHT LEG. pLANS dC TOMORROW.    Follow Up Recommendations  Follow surgeon's recommendation for DC plan and follow-up therapies     Equipment Recommendations  None recommended by PT    Recommendations for Other Services       Precautions / Restrictions Precautions Precautions: Fall    Mobility  Bed Mobility Overal bed mobility: Needs Assistance Bed Mobility: Supine to Sit;Sit to Supine     Supine to sit: Supervision Sit to supine: Min assist   General bed mobility comments: slight assist  for legs onto bed  Transfers Overall transfer level: Needs assistance Equipment used: Rolling walker (2 wheeled) Transfers: Sit to/from Stand           General transfer comment: cues for hand  position  Ambulation/Gait Ambulation/Gait assistance: Min guard Gait Distance (Feet): 180 Feet(20 X 2) Assistive device: Rolling walker (2 wheeled) Gait Pattern/deviations: Step-through pattern     General Gait Details: cues for sequence and posture, tends to look at feet.    Stairs             Wheelchair Mobility    Modified Rankin (Stroke Patients Only)       Balance                                            Cognition Arousal/Alertness: Awake/alert                                            Exercises Total Joint Exercises Ankle Circles/Pumps: AROM;Both;10 reps;Supine Quad Sets: AROM;Both;10 reps;Supine Short Arc Quad: AROM;Right;10 reps;Supine Heel Slides: AAROM;Right;10 reps;Supine Hip ABduction/ADduction: AAROM;Right;10 reps;Supine    General Comments         Pertinent Vitals/Pain Pain Score: 2  Pain Descriptors / Indicators: Discomfort;Guarding Pain Intervention(s): Repositioned;Premedicated before session;Monitored during session;Ice applied    Home Living                      Prior Function            PT Goals (current goals can now be found in the care plan section) Progress towards PT goals: Progressing toward goals    Frequency    7X/week      PT Plan Current plan remains appropriate    Co-evaluation              AM-PAC PT "6 Clicks" Mobility   Outcome Measure  Help needed turning from your back to your side while in a flat bed without using bedrails?: A Little Help needed moving from lying on your back to sitting on the side of a flat bed without using bedrails?: A Little Help needed moving to and from a bed to a chair (including a wheelchair)?: A Little Help needed standing up from a chair using your arms (e.g., wheelchair or bedside chair)?: A Little Help needed to walk in hospital room?: A Little Help  needed climbing 3-5 steps with a railing? : A Lot 6 Click Score: 14    End of Session Equipment Utilized During Treatment: Gait belt Activity Tolerance: Patient tolerated treatment well Patient left: in bed;with call bell/phone within reach;with bed alarm set Nurse Communication: Mobility status PT Visit Diagnosis: Unsteadiness on feet (R26.81);Pain Pain - Right/Left: Right Pain - part of body: Hip     Time: 8588-5027 PT Time Calculation (min) (ACUTE ONLY): 23 min  Charges:  $Gait Training: 8-22 mins $Self Care/Home Management: [place] Pager [phone] Office [phone]    Claretha Cooper 07/08/2018, 2:35 PM

## 2018-07-08 NOTE — Plan of Care (Signed)
°  Problem: Clinical Measurements: °Goal: Ability to maintain clinical measurements within normal limits will improve °Outcome: Progressing °Goal: Will remain free from infection °Outcome: Progressing °Goal: Diagnostic test results will improve °Outcome: Progressing °Goal: Respiratory complications will improve °Outcome: Progressing °  °

## 2018-07-09 ENCOUNTER — Encounter (HOSPITAL_COMMUNITY): Payer: Self-pay | Admitting: Orthopedic Surgery

## 2018-07-09 DIAGNOSIS — F329 Major depressive disorder, single episode, unspecified: Secondary | ICD-10-CM | POA: Diagnosis not present

## 2018-07-09 DIAGNOSIS — Z96619 Presence of unspecified artificial shoulder joint: Secondary | ICD-10-CM | POA: Diagnosis not present

## 2018-07-09 DIAGNOSIS — M25751 Osteophyte, right hip: Secondary | ICD-10-CM | POA: Diagnosis not present

## 2018-07-09 DIAGNOSIS — M1611 Unilateral primary osteoarthritis, right hip: Secondary | ICD-10-CM | POA: Diagnosis not present

## 2018-07-09 DIAGNOSIS — F419 Anxiety disorder, unspecified: Secondary | ICD-10-CM | POA: Diagnosis not present

## 2018-07-09 DIAGNOSIS — I10 Essential (primary) hypertension: Secondary | ICD-10-CM | POA: Diagnosis not present

## 2018-07-09 LAB — BASIC METABOLIC PANEL
Anion gap: 9 (ref 5–15)
BUN: 17 mg/dL (ref 8–23)
CO2: 25 mmol/L (ref 22–32)
Calcium: 9.2 mg/dL (ref 8.9–10.3)
Chloride: 107 mmol/L (ref 98–111)
Creatinine, Ser: 0.73 mg/dL (ref 0.44–1.00)
GFR calc Af Amer: >60 mL/min (ref >60)
GFR calc non Af Amer: >60 mL/min (ref >60)
Glucose, Bld: 106 mg/dL — ABNORMAL HIGH (ref 70–99)
Potassium: 4.5 mmol/L (ref 3.5–5.1)
SODIUM: 141 mmol/L (ref 135–145)

## 2018-07-09 LAB — CBC
HCT: 28.7 % — ABNORMAL LOW (ref 36.0–46.0)
Hemoglobin: 9 g/dL — ABNORMAL LOW (ref 12.0–15.0)
MCH: 29.9 pg (ref 26.0–34.0)
MCHC: 31.4 g/dL (ref 30.0–36.0)
MCV: 95.3 fL (ref 80.0–100.0)
Platelets: 162 10*3/uL (ref 150–400)
RBC: 3.01 MIL/uL — ABNORMAL LOW (ref 3.87–5.11)
RDW: 14.6 % (ref 11.5–15.5)
WBC: 12.7 10*3/uL — ABNORMAL HIGH (ref 4.0–10.5)
nRBC: 0 % (ref 0.0–0.2)

## 2018-07-09 NOTE — Progress Notes (Signed)
Physical Therapy Treatment Patient Details Name: [name] MRN: [id number] DOB: [birth year] Today's Date: 07/09/2018    History of Present Illness admitted for right direct ndterior THA, recently had a fall, H/O HTN ADD depression, L hip fracture, multiple back and shoulder surgeries    PT Comments    POD # 1 am session Assisted with amb to bathroom.  General transfer comment: 25% VC's on safety with turns pt slightly impulsive and quick.  Assisted with amb in hallway.  General Gait Details: 25% VC's on safety with turns pt, pt slightly impulsive and quick.  Then returned to room to Performed some TE's following HEP handout.  Instructed on proper tech, freq as well as use of ICE.   Pt will need another PT session to address stairs.    Follow Up Recommendations  Follow surgeon's recommendation for DC plan and follow-up therapies     Equipment Recommendations  None recommended by PT    Recommendations for Other Services       Precautions / Restrictions Precautions Precautions: Fall Restrictions Weight Bearing Restrictions: No    Mobility  Bed Mobility               General bed mobility comments: OOB in recliner   Transfers Overall transfer level: Needs assistance Equipment used: Rolling walker (2 wheeled) Transfers: Sit to/from Stand Sit to Stand: Supervision;Min guard         General transfer comment: 25% VC's on safety with turns pt slightly impulsive and quick  Ambulation/Gait Ambulation/Gait assistance: Min guard;Supervision Gait Distance (Feet): 125 Feet Assistive device: Rolling walker (2 wheeled) Gait Pattern/deviations: Step-through pattern Gait velocity: too quick   General Gait Details: 25% VC's on safety with turns pt, pt slightly impulsive and quick   Stairs             Wheelchair Mobility    Modified Rankin (Stroke Patients Only)       Balance                                            Cognition  Arousal/Alertness: Awake/alert Behavior During Therapy: WFL for tasks assessed/performed Overall Cognitive Status: Within Functional Limits for tasks assessed                                 General Comments: Sprite and ready to go      Exercises   Total Hip Replacement TE's 10 reps ankle pumps 10 reps knee presses 10 reps heel slides 10 reps SAQ's 10 reps ABD Followed by ICE    General Comments        Pertinent Vitals/Pain Pain Assessment: 0-10 Pain Score: 3  Pain Location: R thigh Pain Descriptors / Indicators: Discomfort;Guarding;Operative site guarding;Tightness Pain Intervention(s): Monitored during session;Repositioned;Premedicated before session;Ice applied    Home Living                      Prior Function            PT Goals (current goals can now be found in the care plan section) Progress towards PT goals: Progressing toward goals    Frequency    7X/week      PT Plan Current plan remains appropriate    Co-evaluation  AM-PAC PT "6 Clicks" Mobility   Outcome Measure  Help needed turning from your back to your side while in a flat bed without using bedrails?: A Little Help needed moving from lying on your back to sitting on the side of a flat bed without using bedrails?: A Little Help needed moving to and from a bed to a chair (including a wheelchair)?: A Little Help needed standing up from a chair using your arms (e.g., wheelchair or bedside chair)?: A Little Help needed to walk in hospital room?: A Little Help needed climbing 3-5 steps with a railing? : A Lot 6 Click Score: 17    End of Session Equipment Utilized During Treatment: Gait belt Activity Tolerance: Patient tolerated treatment well Patient left: in bed;with call bell/phone within reach Nurse Communication: Mobility status PT Visit Diagnosis: Unsteadiness on feet (R26.81);Pain Pain - Right/Left: Right Pain - part of body: Hip     Time:  0935-1000 PT Time Calculation (min) (ACUTE ONLY): 25 min  Charges:  $Gait Training: 8-22 mins $Therapeutic Exercise: 8-22 mins                     Rica Koyanagi  PTA Acute  Rehabilitation Services Pager      [phone] Office      [phone]

## 2018-07-09 NOTE — Progress Notes (Signed)
     Subjective: 2 Days Post-Op Procedure(s) (LRB): TOTAL HIP ARTHROPLASTY ANTERIOR APPROACH (Right)   Seen by Dr. Alvan Dame.  Patient reports pain as mild, pain controlled.  No events throughout the night. Feels more comfortable after receiving more PT.  Ready to be discharged home.    Objective:   VITALS:   Vitals:   07/09/18 0153 07/09/18 0517  BP: 108/67 121/67  Pulse: 78 74  Resp: 20 14  Temp: 97.7 F (36.5 C) 97.6 F (36.4 C)  SpO2: 96% 97%    Dorsiflexion/Plantar flexion intact Incision: dressing C/D/I No cellulitis present Compartment soft  LABS Recent Labs    07/08/18 0424 07/09/18 0357  HGB 9.5* 9.0*  HCT 30.5* 28.7*  WBC 12.4* 12.7*  PLT 163 162    Recent Labs    07/08/18 0424 07/09/18 0357  NA 139 141  K 4.3 4.5  BUN 13 17  CREATININE 0.76 0.73  GLUCOSE 111* 106*     Assessment/Plan: 2 Days Post-Op Procedure(s) (LRB): TOTAL HIP ARTHROPLASTY ANTERIOR APPROACH (Right) Up with therapy Discharge home Follow up in 2 weeks at [facility] ([place]). Follow up with OLIN,Mariajose Mow D in 2 weeks.  Contact information:  [facility]) [address] [place] Hamna Asa   PAC  07/09/2018, 7:45 AM

## 2018-07-09 NOTE — Plan of Care (Signed)
?  Problem: Clinical Measurements: ?Goal: Ability to maintain clinical measurements within normal limits will improve ?Outcome: Progressing ?Goal: Will remain free from infection ?Outcome: Progressing ?Goal: Diagnostic test results will improve ?Outcome: Progressing ?  ?

## 2018-07-09 NOTE — Progress Notes (Signed)
Physical Therapy Treatment Patient Details Name: [name] MRN: [id number] DOB: 10/21/1945 Today's Date: 07/09/2018    History of Present Illness admitted for right direct ndterior THA, recently had a fall, H/O HTN ADD depression, L hip fracture, multiple back and shoulder surgeries    PT Comments    POD # 1 pm session Assisted with amb in hallway a second time. Practiced stairs.  General stair comments: up forward with walker with instruction on assist level and position.  25% VC's on proper sequencing and safety.  Performeed twice.  Then returned to room to perform some TE's following HEP handout.  Instructed on proper tech, freq as well as use of ICE.   Addressed all mobility questions, discussed appropriate activity, educated on use of ICE.  Pt ready for D/C to home.   Follow Up Recommendations  Follow surgeon's recommendation for DC plan and follow-up therapies     Equipment Recommendations  None recommended by PT    Recommendations for Other Services       Precautions / Restrictions Precautions Precautions: Fall Restrictions Weight Bearing Restrictions: No    Mobility  Bed Mobility               General bed mobility comments: OOB in recliner   Transfers Overall transfer level: Needs assistance Equipment used: Rolling walker (2 wheeled) Transfers: Sit to/from Stand Sit to Stand: Supervision;Min guard         General transfer comment: 25% VC's on safety with turns pt slightly impulsive and quick  Ambulation/Gait Ambulation/Gait assistance: Min guard;Supervision Gait Distance (Feet): 85 Feet Assistive device: Rolling walker (2 wheeled) Gait Pattern/deviations: Step-through pattern Gait velocity: too quick   General Gait Details: 25% VC's on safety with turns pt, pt slightly impulsive and quick.     Stairs Stairs: Yes Stairs assistance: Min guard Stair Management: No rails;Step to pattern;Forwards;With walker Number of Stairs: 2 General  stair comments: up forward with walker with instruction on assist level and position.  25% VC's on proper sequencing and safety.  Performeed twice.     Wheelchair Mobility    Modified Rankin (Stroke Patients Only)       Balance                                            Cognition Arousal/Alertness: Awake/alert Behavior During Therapy: WFL for tasks assessed/performed Overall Cognitive Status: Within Functional Limits for tasks assessed                                 General Comments: Sprite and ready to go      Exercises   10 reps all standing TE's following HEP handout.    General Comments        Pertinent Vitals/Pain Pain Assessment: 0-10 Pain Score: 3  Pain Location: R thigh Pain Descriptors / Indicators: Discomfort;Guarding;Operative site guarding;Tightness Pain Intervention(s): Monitored during session;Repositioned;Premedicated before session;Ice applied    Home Living                      Prior Function            PT Goals (current goals can now be found in the care plan section) Progress towards PT goals: Progressing toward goals    Frequency    7X/week  PT Plan Current plan remains appropriate    Co-evaluation              AM-PAC PT "6 Clicks" Mobility   Outcome Measure  Help needed turning from your back to your side while in a flat bed without using bedrails?: A Little Help needed moving from lying on your back to sitting on the side of a flat bed without using bedrails?: A Little Help needed moving to and from a bed to a chair (including a wheelchair)?: A Little Help needed standing up from a chair using your arms (e.g., wheelchair or bedside chair)?: A Little Help needed to walk in hospital room?: A Little Help needed climbing 3-5 steps with a railing? : A Lot 6 Click Score: 17    End of Session Equipment Utilized During Treatment: Gait belt Activity Tolerance: Patient tolerated  treatment well Patient left: in bed;with call bell/phone within reach Nurse Communication: Mobility status PT Visit Diagnosis: Unsteadiness on feet (R26.81);Pain Pain - Right/Left: Right Pain - part of body: Hip     Time: 8088-1103 PT Time Calculation (min) (ACUTE ONLY): 24 min  Charges:  $Gait Training: 8-22 mins $Self Care/Home Management: 8-22                     {Maico Mulvehill  PTA Acute  Rehabilitation Services Pager      [phone] Office      [phone]

## 2018-07-14 NOTE — Discharge Summary (Signed)
Physician Discharge Summary  Patient ID: [name] MRN: [id number] DOB/AGE: 73-Aug-1947 73 y.o.  Admit date: 07/07/2018 Discharge date: 07/09/2018   Procedures:  Procedure(s) (LRB): TOTAL HIP ARTHROPLASTY ANTERIOR APPROACH (Right)  Attending Physician:  Dr. Paralee Cancel   Admission Diagnoses:   Right hip primary OA / pain  Discharge Diagnoses:  Principal Problem:   S/P right THA, AA Active Problems:   Status post right hip replacement   Overweight (BMI 25.0-29.9)  Past Medical History:  Diagnosis Date  . Allergy   . Anxiety   . Asthma   . Attention deficit disorder without mention of hyperactivity   . Back pain   . Colitis 12/05/2010  . Colon polyps   . Complication of anesthesia    problem with grogginess and a long time to get over Anesthesia  . Depression   . Diverticulosis   . GERD (gastroesophageal reflux disease)   . H/O hiatal hernia   . Hypertension   . IBS (irritable bowel syndrome)   . Osteoarthrosis, unspecified whether generalized or localized, unspecified site   . Other and unspecified hyperlipidemia   . Renal cyst 10/2015   simple left  . Thyroid disease   . Ulcerative colitis   . Unspecified asthma(493.90)   . Unspecified essential hypertension    taken off meds since lost wt    HPI:    [name], 73 y.o. female, has a history of pain and functional disability in the right hip(s) due to arthritis and patient has failed non-surgical conservative treatments for greater than 12 weeks to include NSAID's and/or analgesics, use of assistive devices and activity modification.  Onset of symptoms was gradual starting 5 years ago with gradually worsening course since that time.The patient noted no past surgery on the right hip(s).  Patient currently rates pain in the right hip at 8 out of 10 with activity. Patient has night pain, worsening of pain with activity and weight bearing, trendelenberg gait, pain that interfers with activities of daily  living and pain with passive range of motion. Patient has evidence of periarticular osteophytes and joint space narrowing by imaging studies. This condition presents safety issues increasing the risk of falls. There is no current active infection.  Risks, benefits and expectations were discussed with the patient.  Risks including but not limited to the risk of anesthesia, blood clots, nerve damage, blood vessel damage, failure of the prosthesis, infection and up to and including death.  Patient understand the risks, benefits and expectations and wishes to proceed with surgery.   PCP: Marton Redwood, MD   Discharged Condition: good  Hospital Course:  Patient underwent the above stated procedure on 07/07/2018. Patient tolerated the procedure well and brought to the recovery room in good condition and subsequently to the floor.  POD #1 BP: 103/59 ; Pulse: 71 ; Temp: 98.1 F (36.7 C) ; Resp: 18 Patient reports pain as mild, pain controlled.  No events throughout the night. States that she hasn't really had a good session with PT, but knows they will come this morning. We have discussed that if she does well with PT that she will be able to d/c home, otherwise if she stays it will be for pain control and need for inpatient therapy to meet goal of being discharged home safely with family/caregiver. Dorsiflexion/plantar flexion intact, incision: dressing C/D/I, no cellulitis present and compartment soft.   LABS  Basename    HGB     9.5  HCT  30.5   POD #2  BP: 121/67 ; Pulse: 74 ; Temp: 97.6 F (36.4 C) ; Resp: 14 Patient reports pain as mild, pain controlled.  No events throughout the night. Feels more comfortable after receiving more PT.  Ready to be discharged home.  Dorsiflexion/plantar flexion intact, incision: dressing C/D/I, no cellulitis present and compartment soft.   LABS  Basename    HGB     9.0  HCT     28.7    Discharge Exam: General appearance: alert, cooperative and no  distress Extremities: Homans sign is negative, no sign of DVT, no edema, redness or tenderness in the calves or thighs and no ulcers, gangrene or trophic changes  Disposition:  Home with follow up in 2 weeks   Follow-up Information    Danae Orleans, PA-C. Go on 07/22/2018.   Specialty:  Orthopedic Surgery Why:  Postop appointment scheduled on 07-22-2018 at 10:45 am.  Contact information: [address] [place] [address] [phone]           Discharge Instructions    Call MD / Call 911   Complete by:  As directed    If you experience chest pain or shortness of breath, CALL 911 and be transported to the hospital emergency room.  If you develope a fever above 101 F, pus (white drainage) or increased drainage or redness at the wound, or calf pain, call your surgeon's office.   Change dressing   Complete by:  As directed    Maintain surgical dressing until follow up in the clinic. If the edges start to pull up, may reinforce with tape. If the dressing is no longer working, may remove and cover with gauze and tape, but must keep the area dry and clean.  Call with any questions or concerns.   Constipation Prevention   Complete by:  As directed    Drink plenty of fluids.  Prune juice may be helpful.  You may use a stool softener, such as Colace (over the counter) 100 mg twice a day.  Use MiraLax (over the counter) for constipation as needed.   Diet - low sodium heart healthy   Complete by:  As directed    Discharge instructions   Complete by:  As directed    Maintain surgical dressing until follow up in the clinic. If the edges start to pull up, may reinforce with tape. If the dressing is no longer working, may remove and cover with gauze and tape, but must keep the area dry and clean.  Follow up in 2 weeks at [facility]. Call with any questions or concerns.   Increase activity slowly as tolerated   Complete by:  As directed    Weight bearing as  tolerated with assist device (walker, cane, etc) as directed, use it as long as suggested by your surgeon or therapist, typically at least 4-6 weeks.   TED hose   Complete by:  As directed    Use stockings (TED hose) for 2 weeks on both leg(s).  You may remove them at night for sleeping.      Allergies as of 07/09/2018      Reactions   Ampicillin Nausea And Vomiting   Erythromycin Nausea And Vomiting, Other (See Comments)   Can take Z pac   Hctz [hydrochlorothiazide] Other (See Comments)   dizzy   Metoprolol Other (See Comments)   doublevision    Molds & Smuts Other (See Comments)   Pt coughs and uses an  inhaler.   Morphine And Related Nausea And Vomiting   No problem with hydrocodone   Penicillin G Other (See Comments)   Unknown Has patient had a PCN reaction causing immediate rash, facial/tongue/throat swelling, SOB or lightheadedness with hypotension: Unknown Has patient had a PCN reaction causing severe rash involving mucus membranes or skin necrosis: Unknown Has patient had a PCN reaction that required hospitalization: Unknown Has patient had a PCN reaction occurring within the last 10 years: Unknown If all of the above answers are "NO", then may proceed with Cephalosporin use.      Medication List    STOP taking these medications   DICLOFENAC-MISOPROSTOL PO   HYDROcodone-acetaminophen 5-325 MG tablet Commonly known as:  NORCO/VICODIN Replaced by:  HYDROcodone-acetaminophen 7.5-325 MG tablet   traMADol 50 MG tablet Commonly known as:  ULTRAM     TAKE these medications   ALPRAZolam 0.5 MG tablet Commonly known as:  XANAX Take 1 tablet (0.5 mg total) by mouth 2 (two) times daily as needed for anxiety or sleep.   amLODipine-valsartan 5-160 MG tablet Commonly known as:  EXFORGE Take 1 tablet by mouth daily.   aspirin 81 MG chewable tablet Commonly known as:  ASPIRIN CHILDRENS Chew 1 tablet (81 mg total) by mouth 2 (two) times daily for 30 days. Take for 4 weeks,  then resume regular dose. Notes to patient:  Blood thinner   BIOFREEZE 4 % Gel Generic drug:  Menthol (Topical Analgesic) Apply 1 application topically daily as needed (for pain).   buPROPion 300 MG 24 hr tablet Commonly known as:  WELLBUTRIN XL Take 1 tablet (300 mg total) by mouth daily.   CONCERTA 36 MG CR tablet Generic drug:  methylphenidate Take 36 mg by mouth daily.   docusate sodium 100 MG capsule Commonly known as:  COLACE Take 1 capsule (100 mg total) by mouth 2 (two) times daily. Notes to patient:  Stool softner   ferrous sulfate 325 (65 FE) MG tablet Commonly known as:  FERROUSUL Take 1 tablet (325 mg total) by mouth 3 (three) times daily with meals.   FLUoxetine 20 MG capsule Commonly known as:  PROZAC Take 3 capsules (60 mg total) by mouth daily.   HYDROcodone-acetaminophen 7.5-325 MG tablet Commonly known as:  NORCO Take 1-2 tablets by mouth every 4 (four) hours as needed for moderate pain. Replaces:  HYDROcodone-acetaminophen 5-325 MG tablet Notes to patient:  Pain   HYDROcodone-acetaminophen 7.5-325 MG tablet Commonly known as:  NORCO Take 1-2 tablets by mouth every 4 (four) hours as needed for moderate pain.   lamoTRIgine 100 MG tablet Commonly known as:  LAMICTAL Take 100 mg by mouth 3 (three) times daily.   methocarbamol 500 MG tablet Commonly known as:  ROBAXIN Take 1 tablet (500 mg total) by mouth every 6 (six) hours as needed for muscle spasms. Notes to patient:  Muscle relaxer   pantoprazole 40 MG tablet Commonly known as:  PROTONIX TAKE ONE TABLET TWICE DAILY   polyethylene glycol packet Commonly known as:  MIRALAX / GLYCOLAX Take 17 g by mouth 2 (two) times daily. Notes to patient:  Mild laxative   SYNTHROID 50 MCG tablet Generic drug:  levothyroxine TAKE 2 TABLETS EVERY MORNING What changed:    how much to take  when to take this   VENTOLIN HFA 108 (90 Base) MCG/ACT inhaler Generic drug:  albuterol ONE PUFF FOUR TIMES DAILY  AS NEEDED What changed:  See the new instructions.   VITAMIN B-12 PO Take 1 tablet  by mouth daily.   VITAMIN D PO Take 1 tablet by mouth daily.            Discharge Care Instructions  (From admission, onward)         Start     Ordered   07/09/18 0000  Change dressing    Comments:  Maintain surgical dressing until follow up in the clinic. If the edges start to pull up, may reinforce with tape. If the dressing is no longer working, may remove and cover with gauze and tape, but must keep the area dry and clean.  Call with any questions or concerns.   07/09/18 0745           Signed: [place] Pugh. Raneem Mendolia   PA-C  07/14/2018, 8:38 AM

## 2018-08-11 ENCOUNTER — Other Ambulatory Visit: Payer: Medicare Other

## 2018-08-11 ENCOUNTER — Ambulatory Visit
Admission: RE | Admit: 2018-08-11 | Discharge: 2018-08-11 | Disposition: A | Discharge status: Home / Self Care | Payer: Medicare Other | Source: Ambulatory Visit | Attending: Internal Medicine | Admitting: Internal Medicine

## 2018-08-11 DIAGNOSIS — Z1231 Encounter for screening mammogram for malignant neoplasm of breast: Secondary | ICD-10-CM | POA: Diagnosis not present

## 2018-08-20 DIAGNOSIS — K5190 Ulcerative colitis, unspecified, without complications: Secondary | ICD-10-CM | POA: Diagnosis not present

## 2018-09-15 DIAGNOSIS — M859 Disorder of bone density and structure, unspecified: Secondary | ICD-10-CM | POA: Diagnosis not present

## 2018-09-15 DIAGNOSIS — E7849 Other hyperlipidemia: Secondary | ICD-10-CM | POA: Diagnosis not present

## 2018-09-15 DIAGNOSIS — E038 Other specified hypothyroidism: Secondary | ICD-10-CM | POA: Diagnosis not present

## 2018-09-15 DIAGNOSIS — I10 Essential (primary) hypertension: Secondary | ICD-10-CM | POA: Diagnosis not present

## 2018-09-16 DIAGNOSIS — D649 Anemia, unspecified: Secondary | ICD-10-CM | POA: Diagnosis not present

## 2018-09-16 DIAGNOSIS — R7989 Other specified abnormal findings of blood chemistry: Secondary | ICD-10-CM | POA: Diagnosis not present

## 2018-09-17 DIAGNOSIS — J45909 Unspecified asthma, uncomplicated: Secondary | ICD-10-CM | POA: Diagnosis not present

## 2018-09-17 DIAGNOSIS — R82998 Other abnormal findings in urine: Secondary | ICD-10-CM | POA: Diagnosis not present

## 2018-09-17 DIAGNOSIS — I10 Essential (primary) hypertension: Secondary | ICD-10-CM | POA: Diagnosis not present

## 2018-09-17 DIAGNOSIS — Z1331 Encounter for screening for depression: Secondary | ICD-10-CM | POA: Diagnosis not present

## 2018-09-17 DIAGNOSIS — M545 Low back pain: Secondary | ICD-10-CM | POA: Diagnosis not present

## 2018-09-17 DIAGNOSIS — E038 Other specified hypothyroidism: Secondary | ICD-10-CM | POA: Diagnosis not present

## 2018-09-17 DIAGNOSIS — E7849 Other hyperlipidemia: Secondary | ICD-10-CM | POA: Diagnosis not present

## 2018-09-17 DIAGNOSIS — I700 Atherosclerosis of aorta: Secondary | ICD-10-CM | POA: Diagnosis not present

## 2018-09-17 DIAGNOSIS — M25551 Pain in right hip: Secondary | ICD-10-CM | POA: Diagnosis not present

## 2018-09-17 DIAGNOSIS — Z1339 Encounter for screening examination for other mental health and behavioral disorders: Secondary | ICD-10-CM | POA: Diagnosis not present

## 2018-09-17 DIAGNOSIS — K5190 Ulcerative colitis, unspecified, without complications: Secondary | ICD-10-CM | POA: Diagnosis not present

## 2018-09-17 DIAGNOSIS — Z0000 Encounter for general adult medical examination without abnormal findings: Secondary | ICD-10-CM | POA: Diagnosis not present

## 2018-09-17 DIAGNOSIS — M8580 Other specified disorders of bone density and structure, unspecified site: Secondary | ICD-10-CM | POA: Diagnosis not present

## 2018-09-22 ENCOUNTER — Other Ambulatory Visit: Payer: Medicare Other

## 2018-09-30 DIAGNOSIS — M5412 Radiculopathy, cervical region: Secondary | ICD-10-CM | POA: Diagnosis not present

## 2018-09-30 DIAGNOSIS — I10 Essential (primary) hypertension: Secondary | ICD-10-CM | POA: Diagnosis not present

## 2018-09-30 DIAGNOSIS — R200 Anesthesia of skin: Secondary | ICD-10-CM | POA: Diagnosis not present

## 2018-10-01 ENCOUNTER — Other Ambulatory Visit: Payer: Self-pay | Admitting: Internal Medicine

## 2018-10-22 DIAGNOSIS — K5190 Ulcerative colitis, unspecified, without complications: Secondary | ICD-10-CM | POA: Diagnosis not present

## 2018-10-27 ENCOUNTER — Other Ambulatory Visit: Payer: Self-pay | Admitting: Internal Medicine

## 2018-10-27 DIAGNOSIS — M5412 Radiculopathy, cervical region: Secondary | ICD-10-CM

## 2018-11-05 ENCOUNTER — Other Ambulatory Visit: Payer: Self-pay

## 2018-11-05 ENCOUNTER — Ambulatory Visit
Admission: RE | Admit: 2018-11-05 | Discharge: 2018-11-05 | Disposition: A | Discharge status: Home / Self Care | Payer: Medicare Other | Source: Ambulatory Visit | Attending: Internal Medicine | Admitting: Internal Medicine

## 2018-11-05 DIAGNOSIS — M5412 Radiculopathy, cervical region: Secondary | ICD-10-CM

## 2018-11-05 DIAGNOSIS — M4802 Spinal stenosis, cervical region: Secondary | ICD-10-CM | POA: Diagnosis not present

## 2018-11-09 DIAGNOSIS — M5412 Radiculopathy, cervical region: Secondary | ICD-10-CM | POA: Diagnosis not present

## 2018-11-09 DIAGNOSIS — I10 Essential (primary) hypertension: Secondary | ICD-10-CM | POA: Diagnosis not present

## 2018-11-19 DIAGNOSIS — F3175 Bipolar disorder, in partial remission, most recent episode depressed: Secondary | ICD-10-CM | POA: Diagnosis not present

## 2018-11-19 DIAGNOSIS — F900 Attention-deficit hyperactivity disorder, predominantly inattentive type: Secondary | ICD-10-CM | POA: Diagnosis not present

## 2018-12-01 DIAGNOSIS — M19041 Primary osteoarthritis, right hand: Secondary | ICD-10-CM | POA: Diagnosis not present

## 2018-12-01 DIAGNOSIS — M79641 Pain in right hand: Secondary | ICD-10-CM | POA: Diagnosis not present

## 2018-12-01 DIAGNOSIS — G5603 Carpal tunnel syndrome, bilateral upper limbs: Secondary | ICD-10-CM | POA: Diagnosis not present

## 2018-12-01 DIAGNOSIS — M79642 Pain in left hand: Secondary | ICD-10-CM | POA: Diagnosis not present

## 2019-01-01 DIAGNOSIS — G5603 Carpal tunnel syndrome, bilateral upper limbs: Secondary | ICD-10-CM | POA: Diagnosis not present

## 2019-01-01 DIAGNOSIS — M19041 Primary osteoarthritis, right hand: Secondary | ICD-10-CM | POA: Diagnosis not present

## 2019-01-04 DIAGNOSIS — M1990 Unspecified osteoarthritis, unspecified site: Secondary | ICD-10-CM | POA: Diagnosis not present

## 2019-01-04 DIAGNOSIS — K5190 Ulcerative colitis, unspecified, without complications: Secondary | ICD-10-CM | POA: Diagnosis not present

## 2019-01-04 DIAGNOSIS — M0760 Enteropathic arthropathies, unspecified site: Secondary | ICD-10-CM | POA: Diagnosis not present

## 2019-01-04 DIAGNOSIS — M25569 Pain in unspecified knee: Secondary | ICD-10-CM | POA: Diagnosis not present

## 2019-01-04 DIAGNOSIS — M79643 Pain in unspecified hand: Secondary | ICD-10-CM | POA: Diagnosis not present

## 2019-01-04 DIAGNOSIS — Z79899 Other long term (current) drug therapy: Secondary | ICD-10-CM | POA: Diagnosis not present

## 2019-01-11 DIAGNOSIS — K5190 Ulcerative colitis, unspecified, without complications: Secondary | ICD-10-CM | POA: Diagnosis not present

## 2019-01-14 DIAGNOSIS — J45909 Unspecified asthma, uncomplicated: Secondary | ICD-10-CM | POA: Diagnosis not present

## 2019-01-14 DIAGNOSIS — D649 Anemia, unspecified: Secondary | ICD-10-CM | POA: Diagnosis not present

## 2019-01-14 DIAGNOSIS — E785 Hyperlipidemia, unspecified: Secondary | ICD-10-CM | POA: Diagnosis not present

## 2019-01-14 DIAGNOSIS — F3342 Major depressive disorder, recurrent, in full remission: Secondary | ICD-10-CM | POA: Diagnosis not present

## 2019-01-14 DIAGNOSIS — D849 Immunodeficiency, unspecified: Secondary | ICD-10-CM | POA: Diagnosis not present

## 2019-01-14 DIAGNOSIS — M5412 Radiculopathy, cervical region: Secondary | ICD-10-CM | POA: Diagnosis not present

## 2019-01-14 DIAGNOSIS — K5190 Ulcerative colitis, unspecified, without complications: Secondary | ICD-10-CM | POA: Diagnosis not present

## 2019-01-14 DIAGNOSIS — E039 Hypothyroidism, unspecified: Secondary | ICD-10-CM | POA: Diagnosis not present

## 2019-01-14 DIAGNOSIS — I10 Essential (primary) hypertension: Secondary | ICD-10-CM | POA: Diagnosis not present

## 2019-01-21 DIAGNOSIS — Z23 Encounter for immunization: Secondary | ICD-10-CM | POA: Diagnosis not present

## 2019-01-21 DIAGNOSIS — E7849 Other hyperlipidemia: Secondary | ICD-10-CM | POA: Diagnosis not present

## 2019-02-01 ENCOUNTER — Other Ambulatory Visit: Payer: Self-pay | Admitting: Internal Medicine

## 2019-03-09 DIAGNOSIS — K5190 Ulcerative colitis, unspecified, without complications: Secondary | ICD-10-CM | POA: Diagnosis not present

## 2019-04-06 ENCOUNTER — Other Ambulatory Visit: Payer: Self-pay

## 2019-04-06 ENCOUNTER — Encounter: Payer: Self-pay | Admitting: Family Medicine

## 2019-04-06 ENCOUNTER — Ambulatory Visit (INDEPENDENT_AMBULATORY_CARE_PROVIDER_SITE_OTHER): Payer: Medicare Other | Admitting: Family Medicine

## 2019-04-06 DIAGNOSIS — R5383 Other fatigue: Secondary | ICD-10-CM

## 2019-04-06 DIAGNOSIS — R200 Anesthesia of skin: Secondary | ICD-10-CM | POA: Diagnosis not present

## 2019-04-06 DIAGNOSIS — E559 Vitamin D deficiency, unspecified: Secondary | ICD-10-CM

## 2019-04-06 DIAGNOSIS — M2550 Pain in unspecified joint: Secondary | ICD-10-CM | POA: Diagnosis not present

## 2019-04-06 DIAGNOSIS — R202 Paresthesia of skin: Secondary | ICD-10-CM

## 2019-04-06 NOTE — Progress Notes (Signed)
Office Visit Note   Patient: [name]           Date of Birth: 1945/08/03           MRN: [id number] Visit Date: 04/06/2019 Requested by: Marton Redwood, MD [address] [place],  [place] [address] PCP: Marton Redwood, MD  Subjective: Chief Complaint  Patient presents with  . numbness bil hands/has seen hand specialist at [facility]    HPI: She is here with left arm numbness.  Symptoms started a couple months ago, no injury.  She had pain and stiffness in her hand and wrist as well as some numbness in the first, second, and third fingers.  She went to another orthopedic office and was given an injection but she states it did not help.  Denies any neck pain with this, denies any weakness.  She is mostly right-hand-dominant.  She is somewhat concerned because her husband has ALS diagnosed about a year ago.  Her symptoms are different than his.  She also asked me to draw some labs to evaluate fatigue.  Her PCP is apparently out of clinic right now.  She has a history of anemia.               ROS: No fevers or chills.  All other systems were reviewed and are negative.  Objective: Vital Signs: There were no vitals taken for this visit.  Physical Exam:  General:  Alert and oriented, in no acute distress. Pulm:  Breathing unlabored. Psy:  Normal mood, congruent affect. Skin: No rash on her skin. Left arm: Neck range of motion is full, negative Spurling's test.  Upper extremity strength and reflexes are normal.  She has positive Tinel's in the left carpal tunnel and a positive Phalen's test reproducing her symptoms.  No thenar atrophy, 5/5 intrinsic hand strength.  2+ radial pulse.   Imaging: None today.  Assessment & Plan: 1.  Left arm numbness, suspect carpal tunnel syndrome -Night splints for the next 6 weeks.  If symptoms do not improve quickly, then nerve conduction studies.  2.  Fatigue -Labs to evaluate, will copy these to her PCP.     Procedures: No  procedures performed  No notes on file     PMFS History: Patient Active Problem List   Diagnosis Date Noted  . Overweight (BMI 25.0-29.9) 07/08/2018  . 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 Status post right hip replacement 07/07/2018  . Acute encephalopathy 06/04/2018  . Preop exam for internal medicine 12/07/2017  . Chronic tonsillitis 12/05/2017  . Postlaminectomy syndrome, lumbar region 07/03/2017  . Chronic lumbar radiculopathy 07/03/2017  . Shingles outbreak 11/28/2016  . Acute conjunctivitis of right eye 11/28/2016  . Vision blurred 11/28/2016  . Bilateral hearing loss 11/28/2016  . Nasal sore 11/28/2016  . Acute pharyngitis 07/03/2016  . UTI (urinary tract infection) 11/30/2015  . Abdominal pain 09/14/2015  . Nausea without vomiting 07/31/2015  . Chronic right hip pain 07/02/2015  . Sciatica of right side 06/30/2015  . Concussion without loss of consciousness 05/23/2015  . Low back pain radiating to left lower extremity 12/22/2014  . Dizziness 08/11/2014  . DDD (degenerative disc disease), lumbosacral 04/21/2014  . Renal cyst 01/28/2014  . Microscopic hematuria 04/20/2013  . Ulcerative colitis ([place]) 05/06/2012  . Spinal disease 03/29/2012  . Arthralgia 11/01/2011  . Osteoarthritis 08/17/2010  . GERD 08/17/2010  . Dyslipidemia 02/19/2008  . ALLERGIC RHINITIS CAUSE UNSPECIFIED 05/26/2007  . Hypothyroidism 03/21/2007  . Anxiety state 03/21/2007  .  Depression 03/21/2007  . Attention deficit disorder of adult 03/21/2007  . Essential hypertension 03/21/2007  . Asthma 03/21/2007  . Irritable bowel syndrome 03/21/2007   Past Medical History:  Diagnosis Date  . Allergy   . Anxiety   . Asthma   . Attention deficit disorder without mention of hyperactivity   . Back pain   . Colitis 12/05/2010  . Colon polyps   . Complication of anesthesia    problem with grogginess and a long time to get over Anesthesia  . Depression   . Diverticulosis   . GERD (gastroesophageal  reflux disease)   . H/O hiatal hernia   . Hypertension   . IBS (irritable bowel syndrome)   . Osteoarthrosis, unspecified whether generalized or localized, unspecified site   . Other and unspecified hyperlipidemia   . Renal cyst 10/2015   simple left  . Thyroid disease   . Ulcerative colitis   . Unspecified asthma(493.90)   . Unspecified essential hypertension    taken off meds since lost wt    Family History  Problem Relation Age of Onset  . Hypertension Father   . Colon cancer Father 60  . Arthritis Father   . Heart disease Father        CHF  . Heart disease Mother        Died, 86  . Healthy Brother   . Healthy Daughter   . Diabetes Neg Hx   . Breast cancer Neg Hx     Past Surgical History:  Procedure Laterality Date  . ABDOMINAL EXPOSURE N/A 04/22/2014   Procedure: ABDOMINAL EXPOSURE;  Surgeon: Angelia Mould, MD;  Location: [facility];  Service: Vascular;  Laterality: N/A;  . ANTERIOR LATERAL LUMBAR FUSION 4 LEVELS Left 04/22/2014   Procedure: Left Lumbar one/two, two/three, three/four, four,five. Anterior lateral lumbar interbody fusion**Stage 1**;  Surgeon: Erline Levine, MD;  Location: [facility];  Service: Neurosurgery;  Laterality: Left;  . ANTERIOR LUMBAR FUSION N/A 04/22/2014   Procedure: Lumbar five/-Sacral one. Anterior lumbar interbody fusion with Dr. Scot Dock; Left Lumbar one/two, two/three/three/four, four/five. Anterior lateral lumbar interbody fusion**Stage 1**;  Surgeon: Erline Levine, MD;  Location: [facility];  Service: Neurosurgery;  Laterality: N/A;  . BACK SURGERY     fluid removed from between disks  . BREAST SURGERY     bil breast reduction   . CHOLECYSTECTOMY     Laparoscopic cholecystectomy  . COLONOSCOPY    . HIP FRACTURE SURGERY     left  11/01/2013  . LUMBAR PERCUTANEOUS PEDICLE SCREW 4 LEVEL N/A 04/25/2014   Procedure: **Stage 2** Percutaneous pedicle screw placement L1-5;  Surgeon: Erline Levine, MD;  Location: [facility];   Service: Neurosurgery;  Laterality: N/A;  **Stage 2** Percutaneous pedicle screw placement L1-5  . SHOULDER SURGERY     right shoulder 3x   . TOE SURGERY     right big toe  . TOTAL HIP ARTHROPLASTY Right 07/07/2018   Procedure: TOTAL HIP ARTHROPLASTY ANTERIOR APPROACH;  Surgeon: Paralee Cancel, MD;  Location: [facility];  Service: Orthopedics;  Laterality: Right;  70 mins  . TOTAL SHOULDER ARTHROPLASTY    . UPPER GASTROINTESTINAL ENDOSCOPY  09/28/2016   Dilation   Social History   Occupational History  . Occupation: Psychologist, counselling: SELF EMPLOYED    Comment: retired  Tobacco Use  . Smoking status: Former Smoker    Quit date: 10/30/1978    Years since quitting: 40.4  . Smokeless tobacco: Never  Used  Substance and Sexual Activity  . Alcohol use: Yes    Alcohol/week: 1.0 standard drinks    Types: 1 Standard drinks or equivalent per week    Comment: once a week  . Drug use: No  . Sexual activity: Yes

## 2019-04-07 ENCOUNTER — Telehealth: Payer: Self-pay | Admitting: Family Medicine

## 2019-04-07 LAB — CBC WITH DIFFERENTIAL/PLATELET
Absolute Monocytes: 694 cells/uL (ref 200–950)
Basophils Absolute: 70 cells/uL (ref 0–200)
Basophils Relative: 0.9 %
Eosinophils Absolute: 491 cells/uL (ref 15–500)
Eosinophils Relative: 6.3 %
HCT: 35.7 % (ref 35.0–45.0)
Hemoglobin: 11.6 g/dL — ABNORMAL LOW (ref 11.7–15.5)
Lymphs Abs: 1591 cells/uL (ref 850–3900)
MCH: 27 pg (ref 27.0–33.0)
MCHC: 32.5 g/dL (ref 32.0–36.0)
MCV: 83.2 fL (ref 80.0–100.0)
MPV: 10.1 fL (ref 7.5–12.5)
Monocytes Relative: 8.9 %
Neutro Abs: 4953 cells/uL (ref 1500–7800)
Neutrophils Relative %: 63.5 %
Platelets: 244 10*3/uL (ref 140–400)
RBC: 4.29 10*6/uL (ref 3.80–5.10)
RDW: 17.7 % — ABNORMAL HIGH (ref 11.0–15.0)
Total Lymphocyte: 20.4 %
WBC: 7.8 10*3/uL (ref 3.8–10.8)

## 2019-04-07 LAB — THYROID PANEL WITH TSH
Free Thyroxine Index: 2.8 (ref 1.4–3.8)
T3 Uptake: 30 % (ref 22–35)
T4, Total: 9.2 ug/dL (ref 5.1–11.9)
TSH: 1.83 mIU/L (ref 0.40–4.50)

## 2019-04-07 LAB — COMPREHENSIVE METABOLIC PANEL
AG Ratio: 1.6 (calc) (ref 1.0–2.5)
ALT: 17 U/L (ref 6–29)
AST: 18 U/L (ref 10–35)
Albumin: 4.5 g/dL (ref 3.6–5.1)
Alkaline phosphatase (APISO): 82 U/L (ref 37–153)
BUN/Creatinine Ratio: 19 (calc) (ref 6–22)
BUN: 19 mg/dL (ref 7–25)
CO2: 25 mmol/L (ref 20–32)
Calcium: 9.7 mg/dL (ref 8.6–10.4)
Chloride: 105 mmol/L (ref 98–110)
Creat: 0.98 mg/dL — ABNORMAL HIGH (ref 0.60–0.93)
Globulin: 2.8 g/dL (calc) (ref 1.9–3.7)
Glucose, Bld: 82 mg/dL (ref 65–99)
Potassium: 4.2 mmol/L (ref 3.5–5.3)
Sodium: 139 mmol/L (ref 135–146)
Total Bilirubin: 0.4 mg/dL (ref 0.2–1.2)
Total Protein: 7.3 g/dL (ref 6.1–8.1)

## 2019-04-07 LAB — VITAMIN B12: Vitamin B-12: >2000 pg/mL — ABNORMAL HIGH (ref 200–1100)

## 2019-04-07 LAB — IRON,TIBC AND FERRITIN PANEL
%SAT: 16 % (calc) (ref 16–45)
Ferritin: 16 ng/mL (ref 16–288)
Iron: 62 ug/dL (ref 45–160)
TIBC: 397 mcg/dL (calc) (ref 250–450)

## 2019-04-07 LAB — VITAMIN D 25 HYDROXY (VIT D DEFICIENCY, FRACTURES): Vit D, 25-Hydroxy: 32 ng/mL (ref 30–100)

## 2019-04-07 NOTE — Telephone Encounter (Signed)
Labs show:  Anemia with hemoglobin of 11.6 (mild) and ferritin of 16.  No need for transfusion.  Should improve with consumption of iron-rich foods or a daily iron (I.e. ferrous sulfate or gluconate) supplement at around 325 mg once daily.  Vitamin D borderline at 32.  I recommend increasing by 2,000 IU of vitamin D3 daily.  Creatinine (kiidney function) slightly elevated at 0.98.  Should be rechecked by Dr. Brigitte Pulse in the next month or two.  All else looks good.

## 2019-04-15 ENCOUNTER — Other Ambulatory Visit: Payer: Self-pay | Admitting: Internal Medicine

## 2019-04-22 DIAGNOSIS — G5603 Carpal tunnel syndrome, bilateral upper limbs: Secondary | ICD-10-CM | POA: Diagnosis not present

## 2019-05-03 DIAGNOSIS — G5603 Carpal tunnel syndrome, bilateral upper limbs: Secondary | ICD-10-CM | POA: Diagnosis not present

## 2019-05-03 DIAGNOSIS — G5623 Lesion of ulnar nerve, bilateral upper limbs: Secondary | ICD-10-CM | POA: Diagnosis not present

## 2019-05-04 DIAGNOSIS — K5190 Ulcerative colitis, unspecified, without complications: Secondary | ICD-10-CM | POA: Diagnosis not present

## 2019-05-05 IMAGING — MR MR HEAD W/O CM
9 of 10 series · 37 of 48 positions shown · non-contrast
Comparison: CTA head and neck 06/04/2018

Brain MRI 08/01/2015

CLINICAL DATA: Multiple recent falls. Dizziness. Double vision and
difficulty walking.

EXAM:
MRI HEAD WITHOUT CONTRAST
TECHNIQUE: Multiplanar, multiecho pulse sequences of the brain and surrounding
structures were obtained without intravenous contrast.

[Series 3: DWI · axial · 3.0mm · 0.94mm/px · z∈[-16,+130]mm · 8 of 100 slices shown (1 of 2)]
[im 1/100]
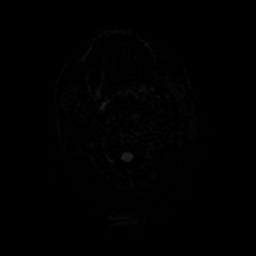
[im 12/100]
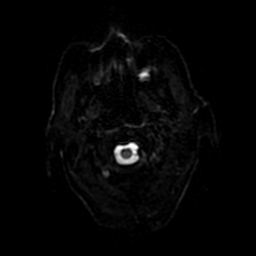
[im 34/100]
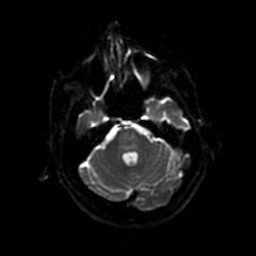
[im 45/100]
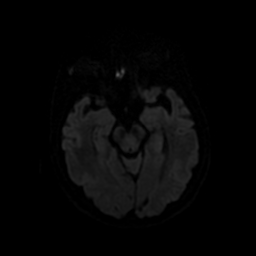
[im 56/100]
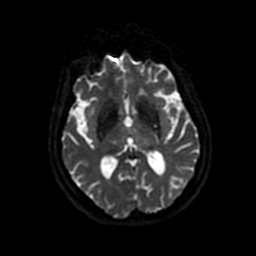
[im 67/100]
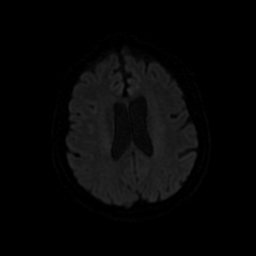
[im 89/100]
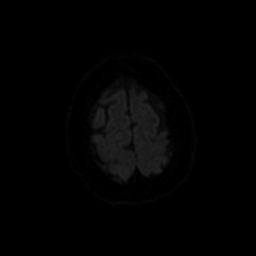
[im 100/100]
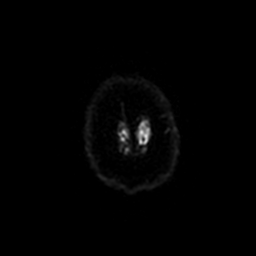

[Series 4: DWI · coronal · 4.0mm · 0.94mm/px · 7 of 72 slices shown (2 of 2)]
[im 1/72]
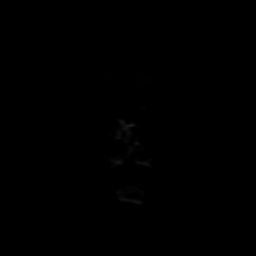
[im 12/72]
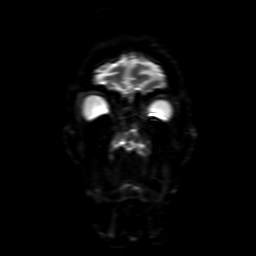
[im 24/72]
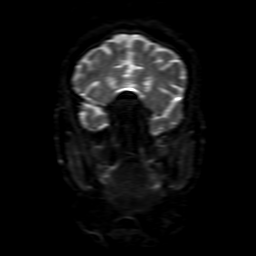
[im 36/72]
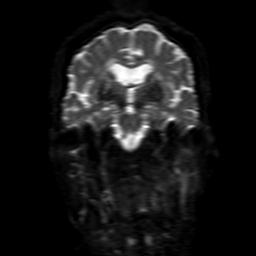
[im 48/72]
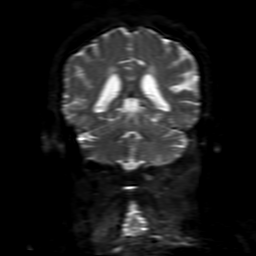
[im 60/72]
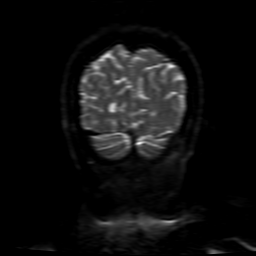
[im 72/72]
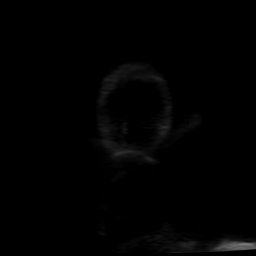

[Series 5: FLAIR · sagittal · 5.0mm · 0.47mm/px · 2 of 23 slices shown (1 of 2)]
[im 1/23]
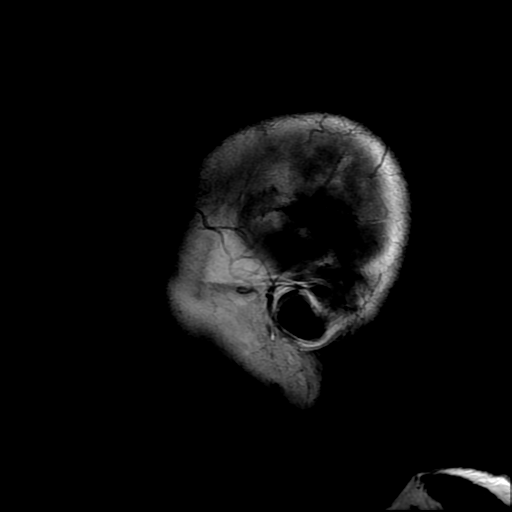
[im 23/23]
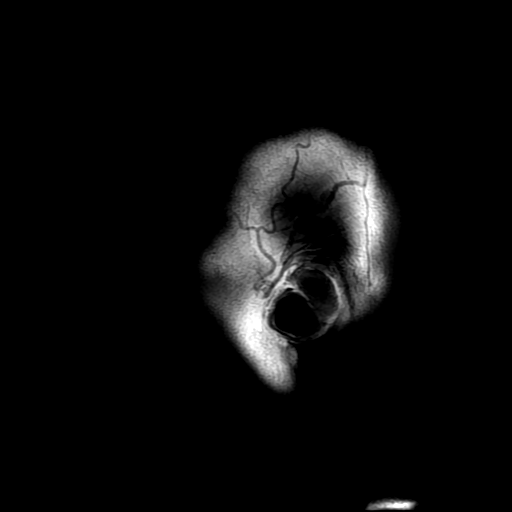

[Series 6: T2 · axial · 5.0mm · 0.47mm/px · z∈[-15,+128]mm · 2 of 25 slices shown (1 of 2)]
[im 1/25]
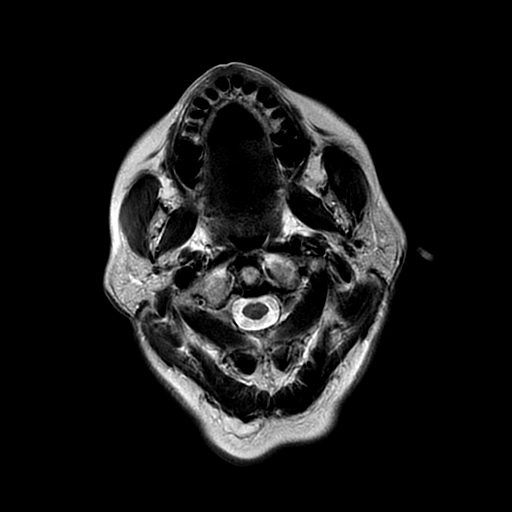
[im 25/25]
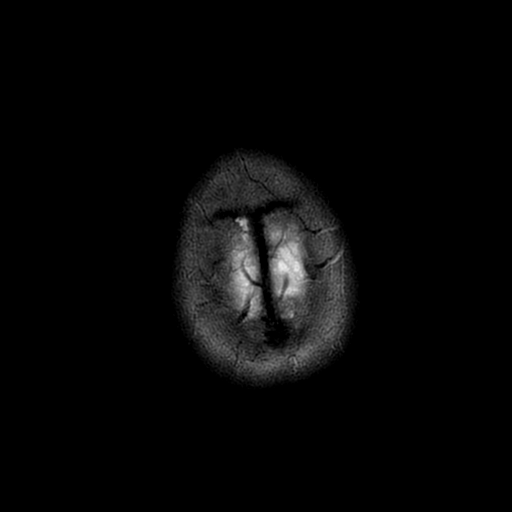

[Series 7: FLAIR · axial · 5.0mm · 0.47mm/px · z∈[-15,+128]mm · 2 of 25 slices shown (2 of 2)]
[im 1/25]
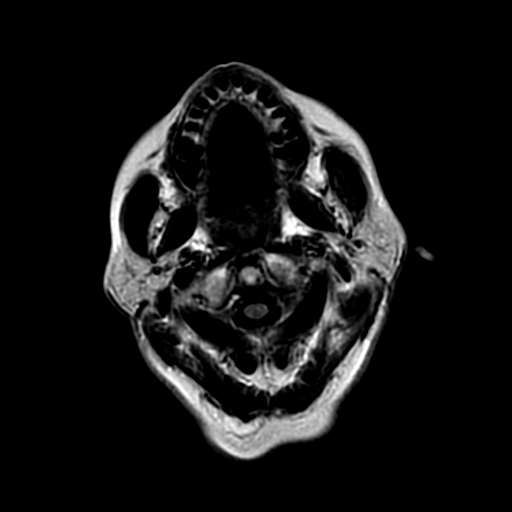
[im 25/25]
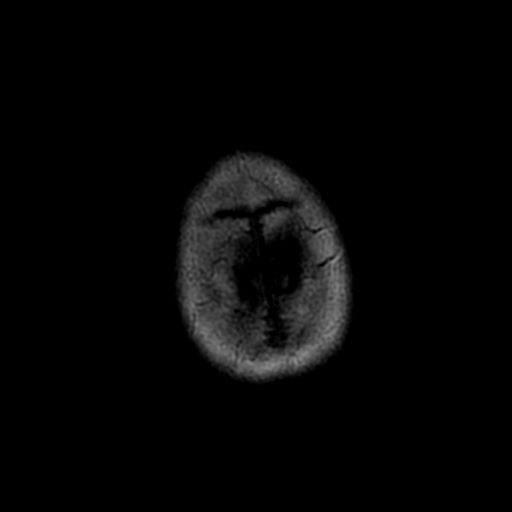

[Series 8: (person_name) · axial · 3.0mm · 0.47mm/px · z∈[-16,+75]mm · 5 of 100 slices shown]
[im 1/100]
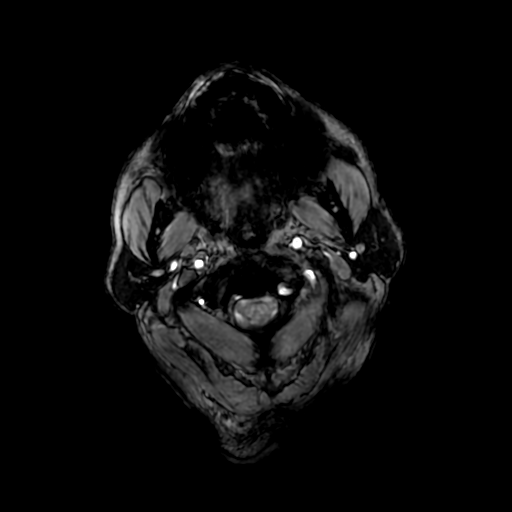
[im 13/100]
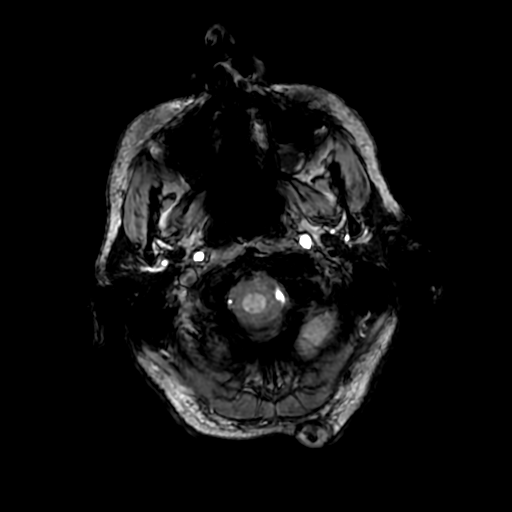
[im 25/100]
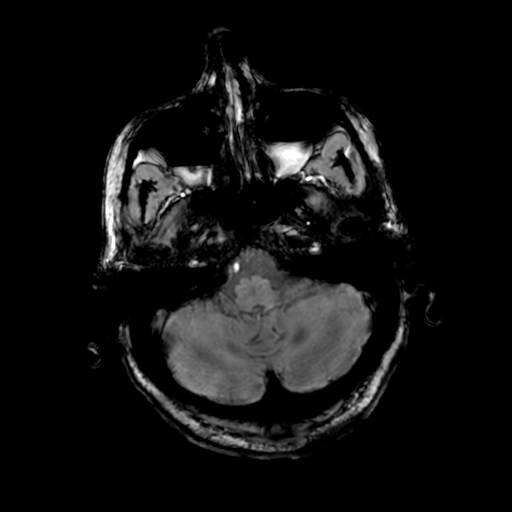
[im 38/100]
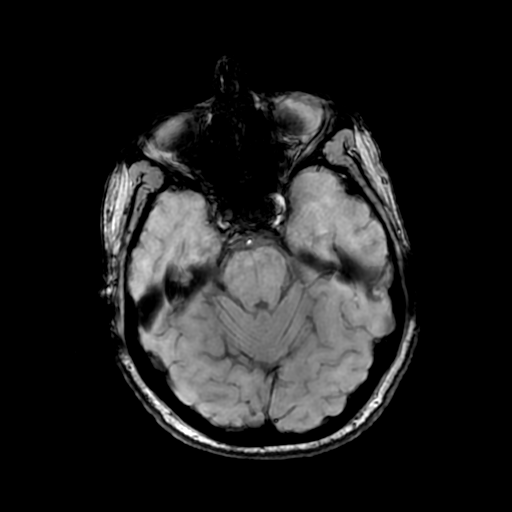
[im 62/100]
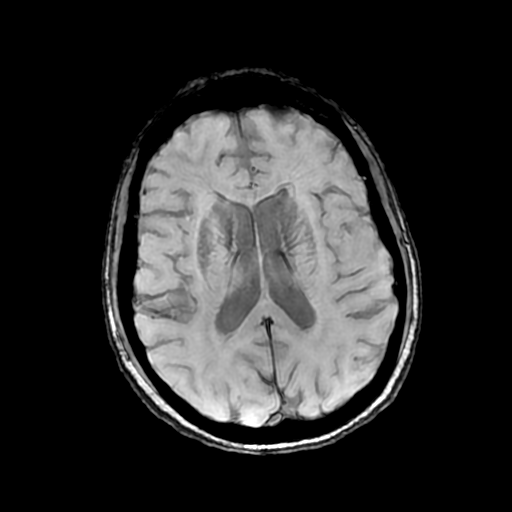

[Series 10: T2 · coronal · 5.0mm · 0.94mm/px · 3 of 30 slices shown (2 of 2)]
[im 1/30]
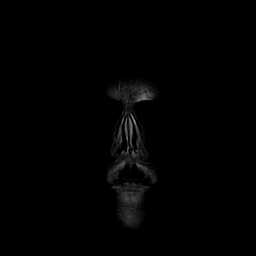
[im 15/30]
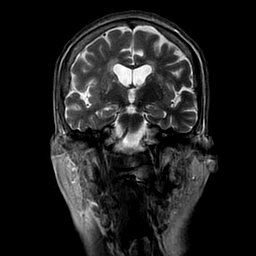
[im 30/30]
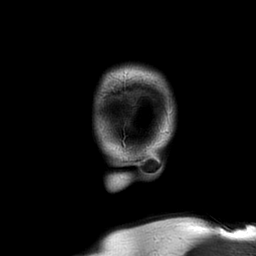

[Series 350: ADC · axial · 3.0mm · 0.94mm/px · z∈[-16,+130]mm · 5 of 50 slices shown (1 of 2)]
[im 1/50]
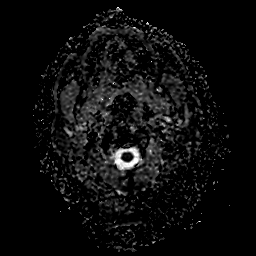
[im 13/50]
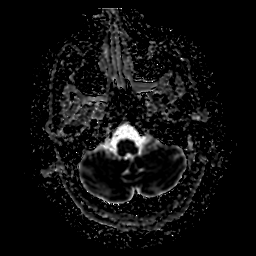
[im 25/50]
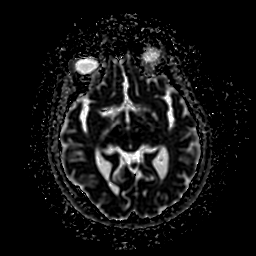
[im 37/50]
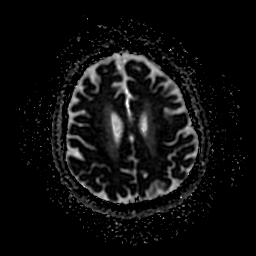
[im 50/50]
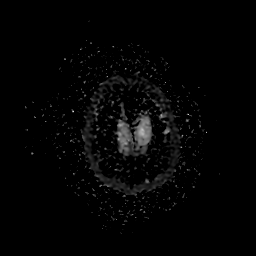

[Series 450: ADC · coronal · 4.0mm · 0.94mm/px · 3 of 36 slices shown (2 of 2)]
[im 1/36]
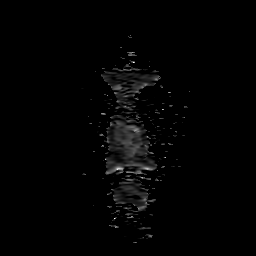
[im 18/36]
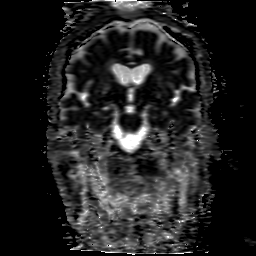
[im 36/36]
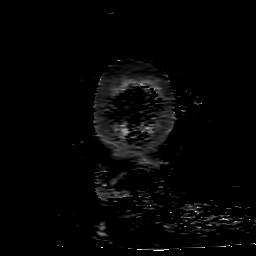

[37 of 48 positions shown; findings below may reference images not displayed]

FINDINGS: BRAIN: There is no acute infarct, acute hemorrhage, hydrocephalus or
extra-axial collection. The midline structures are normal. No
midline shift or other mass effect. There are no old infarcts. Early
confluent hyperintense T2-weighted signal of the periventricular and
deep white matter, most commonly due to chronic ischemic
microangiopathy. The cerebral and cerebellar volume are
age-appropriate. Susceptibility-sensitive sequences show no chronic
microhemorrhage or superficial siderosis.

VASCULAR: Major intracranial arterial and venous sinus flow voids
are normal.

SKULL AND UPPER CERVICAL SPINE: Calvarial bone marrow signal is
normal. There is no skull base mass. Visualized upper cervical spine
and soft tissues are normal.

SINUSES/ORBITS: Bilateral sphenoid and maxillary sinus fluid levels.
Left mastoid effusion. The orbits are normal.
IMPRESSION: 1. No acute intracranial abnormality.
2. Findings of chronic ischemic microangiopathy.
3. Fluid levels within the maxillary and sphenoid sinuses,
compatible with acute sinusitis in the appropriate context. Small
left mastoid effusion.

## 2019-05-27 DIAGNOSIS — F900 Attention-deficit hyperactivity disorder, predominantly inattentive type: Secondary | ICD-10-CM | POA: Diagnosis not present

## 2019-05-27 DIAGNOSIS — F3175 Bipolar disorder, in partial remission, most recent episode depressed: Secondary | ICD-10-CM | POA: Diagnosis not present

## 2019-06-02 DIAGNOSIS — G5603 Carpal tunnel syndrome, bilateral upper limbs: Secondary | ICD-10-CM | POA: Diagnosis not present

## 2019-06-07 IMAGING — RF DG HIP (WITH PELVIS) OPERATIVE*R*
1 series · 7 of 7 positions shown · non-contrast
Comparison: Right hip x-rays dated August 29, 2017.

CLINICAL DATA: Right hip arthroplasty.

EXAM:
OPERATIVE RIGHT HIP (WITH PELVIS IF PERFORMED)
TECHNIQUE: Fluoroscopic spot image(s) were submitted for interpretation
post-operatively.

[Series 1: run · 7 of 7 slices shown]
[im 1/7]
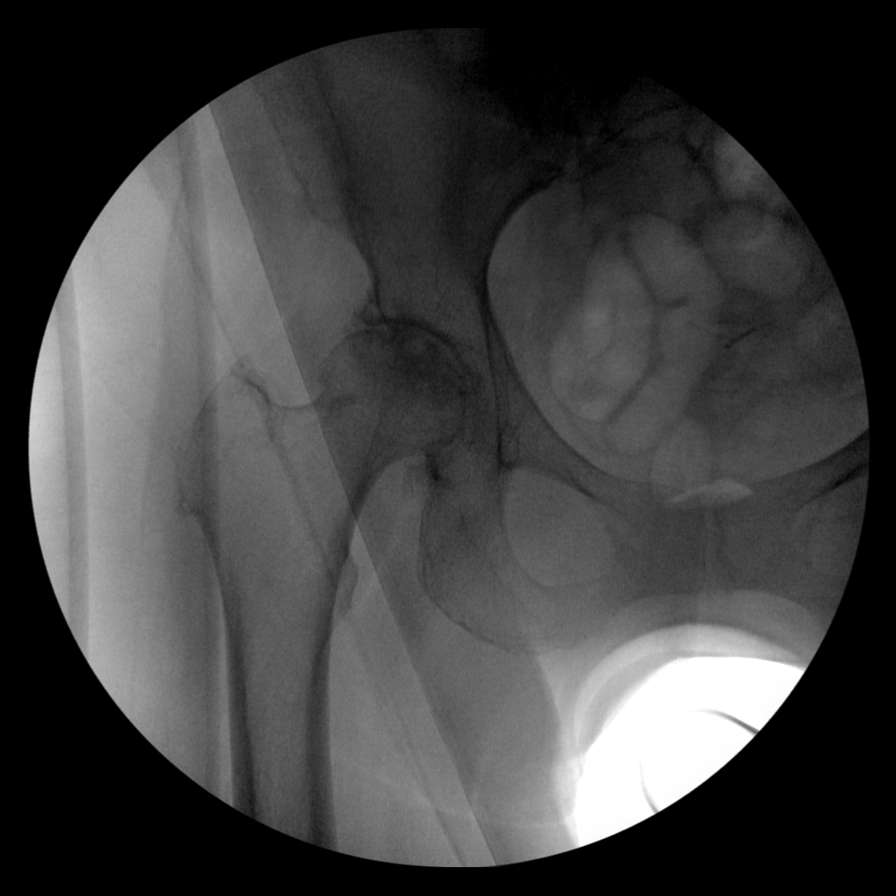
[im 2/7]
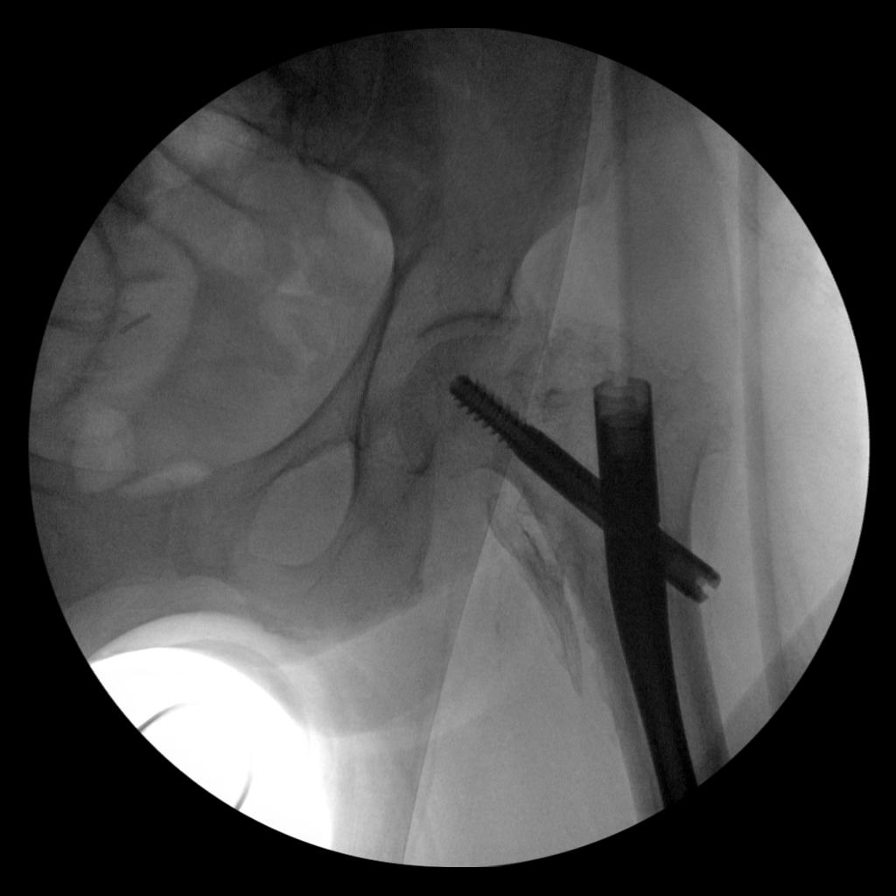
[im 3/7]
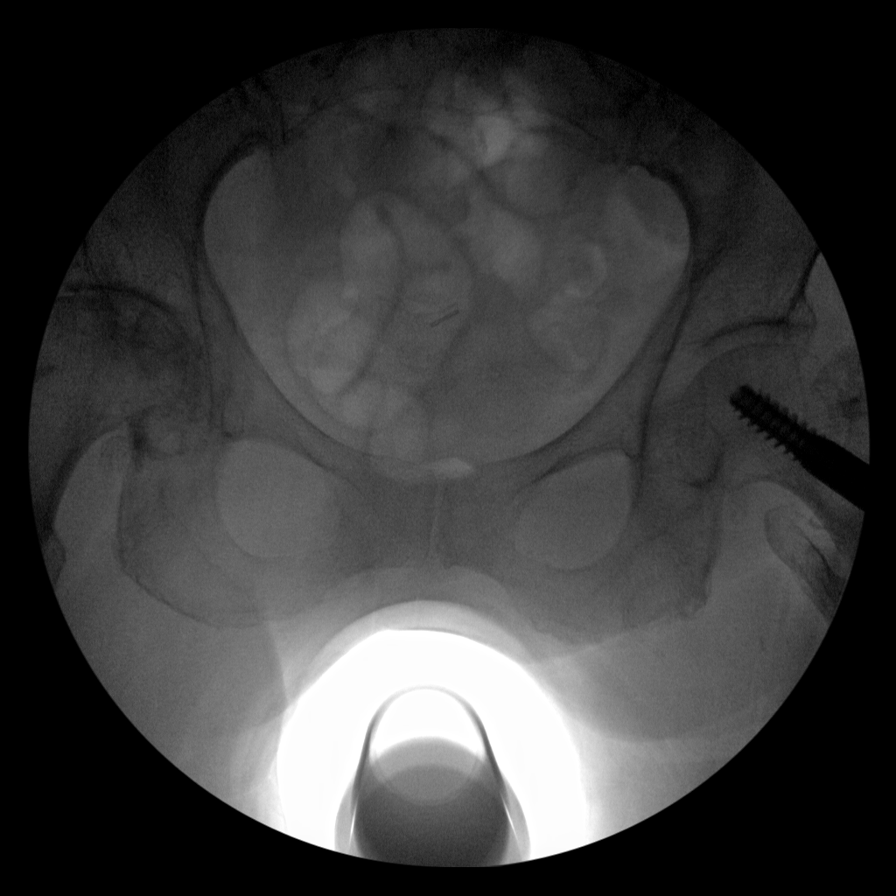
[im 4/7]
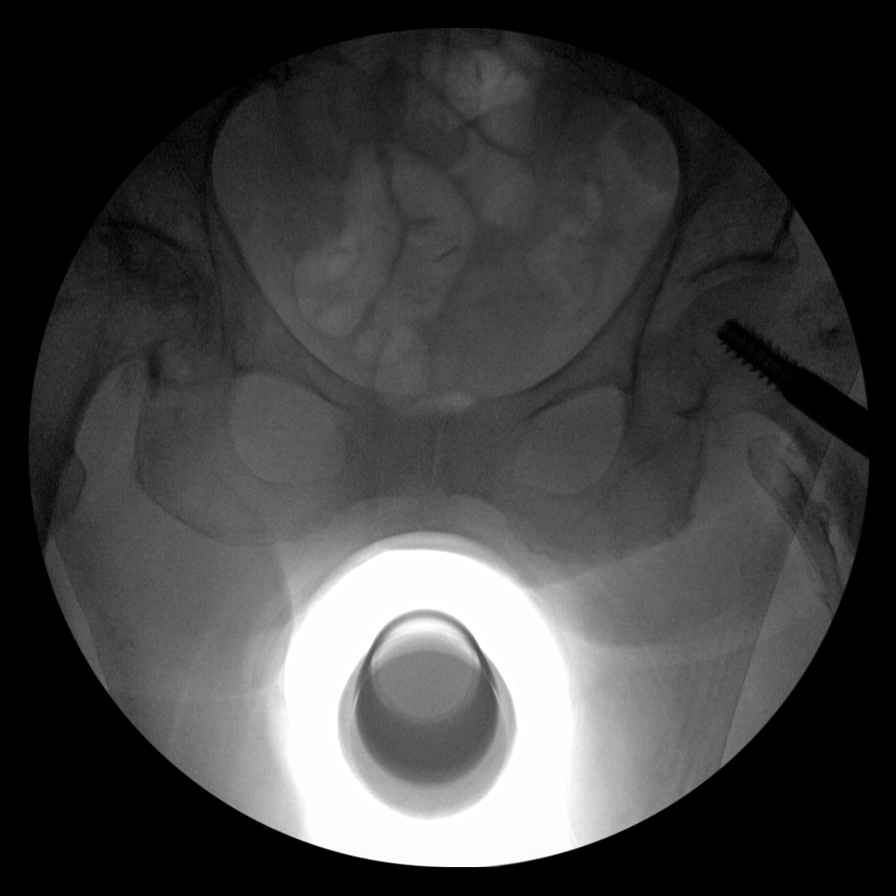
[im 5/7]
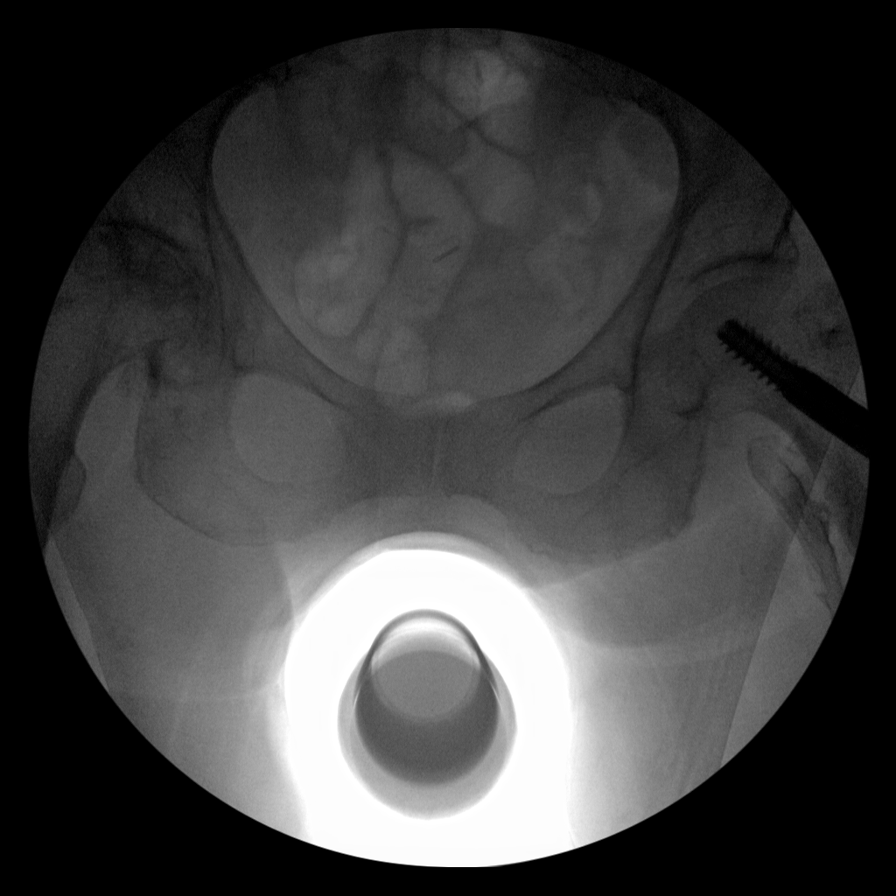
[im 6/7]
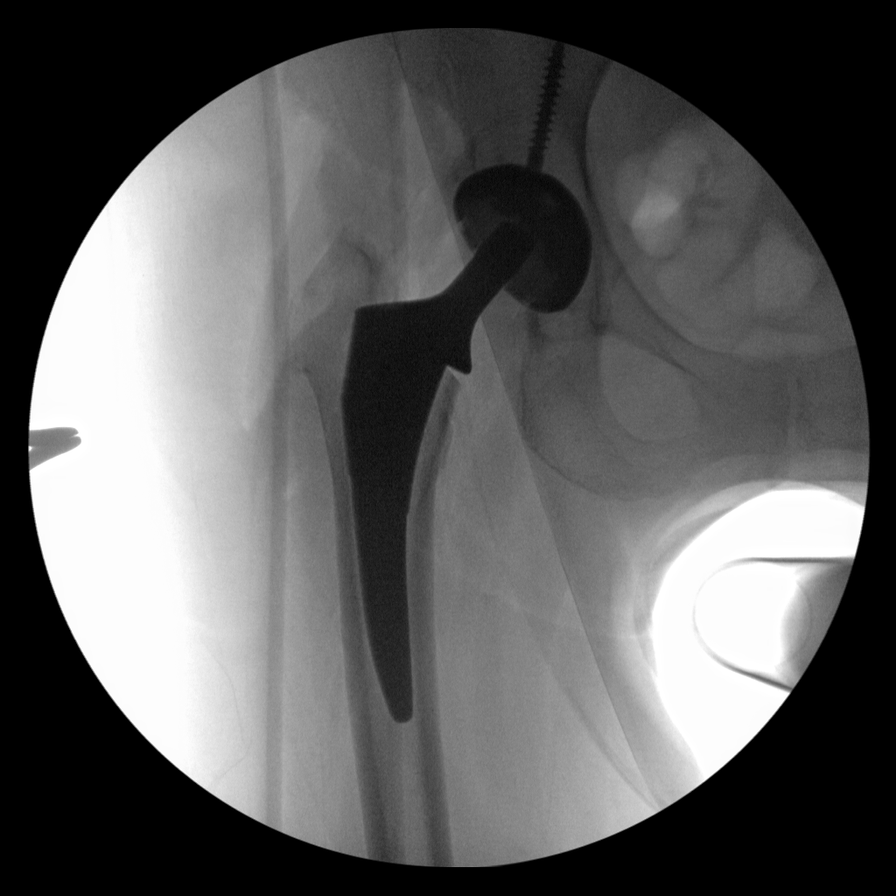
[im 7/7]
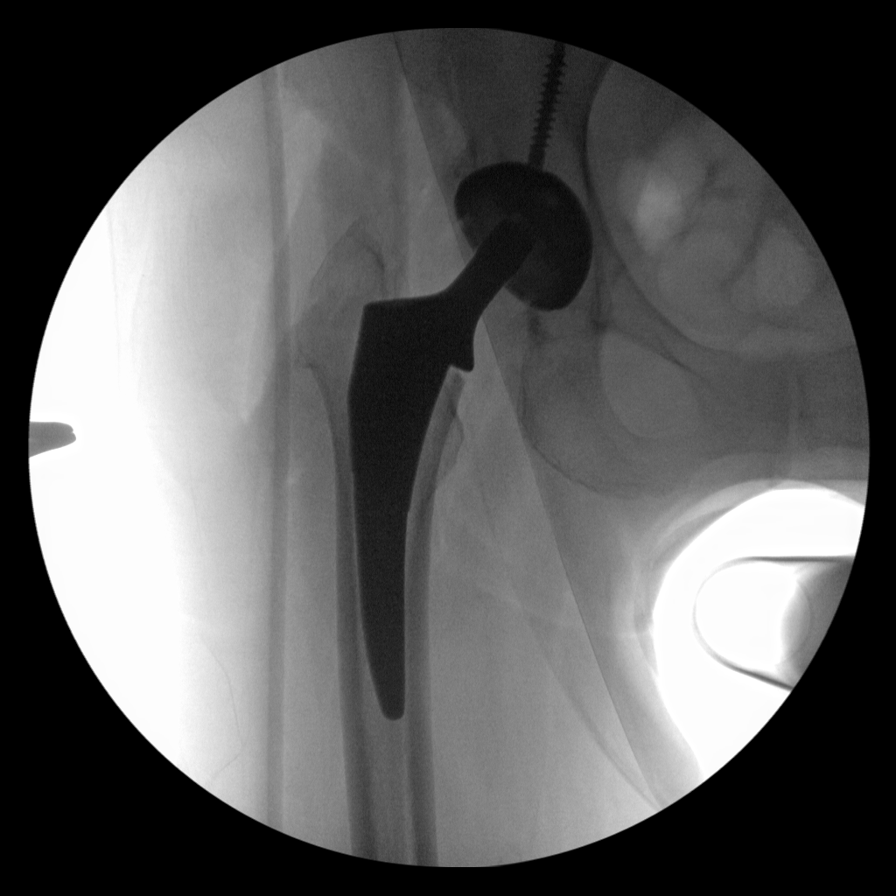

[7 of 7 positions shown; findings below may reference images not displayed]

FINDINGS: Multiple intraoperative fluoroscopic images demonstrate interval
right total hip arthroplasty. Components are well aligned. No acute
abnormality. Prior left hip ORIF.
IMPRESSION: Intraoperative fluoroscopic guidance for right total hip
arthroplasty.

## 2019-06-08 DIAGNOSIS — G5603 Carpal tunnel syndrome, bilateral upper limbs: Secondary | ICD-10-CM | POA: Diagnosis not present

## 2019-07-01 DIAGNOSIS — K5190 Ulcerative colitis, unspecified, without complications: Secondary | ICD-10-CM | POA: Diagnosis not present

## 2019-08-02 DIAGNOSIS — M7062 Trochanteric bursitis, left hip: Secondary | ICD-10-CM | POA: Diagnosis not present

## 2019-08-02 DIAGNOSIS — Z96641 Presence of right artificial hip joint: Secondary | ICD-10-CM | POA: Diagnosis not present

## 2019-08-02 DIAGNOSIS — M7061 Trochanteric bursitis, right hip: Secondary | ICD-10-CM | POA: Diagnosis not present

## 2019-08-10 DIAGNOSIS — G5602 Carpal tunnel syndrome, left upper limb: Secondary | ICD-10-CM | POA: Diagnosis not present

## 2019-09-01 DIAGNOSIS — K5190 Ulcerative colitis, unspecified, without complications: Secondary | ICD-10-CM | POA: Diagnosis not present

## 2019-09-21 DIAGNOSIS — E038 Other specified hypothyroidism: Secondary | ICD-10-CM | POA: Diagnosis not present

## 2019-09-21 DIAGNOSIS — M859 Disorder of bone density and structure, unspecified: Secondary | ICD-10-CM | POA: Diagnosis not present

## 2019-09-21 DIAGNOSIS — E7849 Other hyperlipidemia: Secondary | ICD-10-CM | POA: Diagnosis not present

## 2019-09-22 DIAGNOSIS — S72143A Displaced intertrochanteric fracture of unspecified femur, initial encounter for closed fracture: Secondary | ICD-10-CM | POA: Insufficient documentation

## 2019-09-28 DIAGNOSIS — D849 Immunodeficiency, unspecified: Secondary | ICD-10-CM | POA: Diagnosis not present

## 2019-09-28 DIAGNOSIS — R82998 Other abnormal findings in urine: Secondary | ICD-10-CM | POA: Diagnosis not present

## 2019-09-28 DIAGNOSIS — J45909 Unspecified asthma, uncomplicated: Secondary | ICD-10-CM | POA: Diagnosis not present

## 2019-09-28 DIAGNOSIS — M8580 Other specified disorders of bone density and structure, unspecified site: Secondary | ICD-10-CM | POA: Diagnosis not present

## 2019-09-28 DIAGNOSIS — K5190 Ulcerative colitis, unspecified, without complications: Secondary | ICD-10-CM | POA: Diagnosis not present

## 2019-09-28 DIAGNOSIS — G3184 Mild cognitive impairment, so stated: Secondary | ICD-10-CM | POA: Diagnosis not present

## 2019-09-28 DIAGNOSIS — E039 Hypothyroidism, unspecified: Secondary | ICD-10-CM | POA: Diagnosis not present

## 2019-09-28 DIAGNOSIS — I10 Essential (primary) hypertension: Secondary | ICD-10-CM | POA: Diagnosis not present

## 2019-09-28 DIAGNOSIS — E785 Hyperlipidemia, unspecified: Secondary | ICD-10-CM | POA: Diagnosis not present

## 2019-09-28 DIAGNOSIS — I700 Atherosclerosis of aorta: Secondary | ICD-10-CM | POA: Diagnosis not present

## 2019-09-28 DIAGNOSIS — Z23 Encounter for immunization: Secondary | ICD-10-CM | POA: Diagnosis not present

## 2019-09-28 DIAGNOSIS — Z0000 Encounter for general adult medical examination without abnormal findings: Secondary | ICD-10-CM | POA: Diagnosis not present

## 2019-09-28 DIAGNOSIS — Z1331 Encounter for screening for depression: Secondary | ICD-10-CM | POA: Diagnosis not present

## 2019-09-28 DIAGNOSIS — F3342 Major depressive disorder, recurrent, in full remission: Secondary | ICD-10-CM | POA: Diagnosis not present

## 2019-10-04 DIAGNOSIS — S0081XA Abrasion of other part of head, initial encounter: Secondary | ICD-10-CM | POA: Diagnosis not present

## 2019-10-04 DIAGNOSIS — W19XXXA Unspecified fall, initial encounter: Secondary | ICD-10-CM | POA: Diagnosis not present

## 2019-10-07 ENCOUNTER — Other Ambulatory Visit: Payer: Self-pay | Admitting: Internal Medicine

## 2019-10-07 DIAGNOSIS — M8580 Other specified disorders of bone density and structure, unspecified site: Secondary | ICD-10-CM

## 2019-10-08 ENCOUNTER — Ambulatory Visit
Admission: RE | Admit: 2019-10-08 | Discharge: 2019-10-08 | Disposition: A | Discharge status: Home / Self Care | Payer: Medicare Other | Source: Ambulatory Visit | Attending: Internal Medicine | Admitting: Internal Medicine

## 2019-10-08 ENCOUNTER — Other Ambulatory Visit: Payer: Self-pay

## 2019-10-08 DIAGNOSIS — Z780 Asymptomatic menopausal state: Secondary | ICD-10-CM | POA: Diagnosis not present

## 2019-10-08 DIAGNOSIS — M8580 Other specified disorders of bone density and structure, unspecified site: Secondary | ICD-10-CM

## 2019-10-08 DIAGNOSIS — M810 Age-related osteoporosis without current pathological fracture: Secondary | ICD-10-CM | POA: Diagnosis not present

## 2019-10-15 DIAGNOSIS — F3175 Bipolar disorder, in partial remission, most recent episode depressed: Secondary | ICD-10-CM | POA: Diagnosis not present

## 2019-10-15 DIAGNOSIS — Z96641 Presence of right artificial hip joint: Secondary | ICD-10-CM | POA: Diagnosis not present

## 2019-10-15 DIAGNOSIS — F900 Attention-deficit hyperactivity disorder, predominantly inattentive type: Secondary | ICD-10-CM | POA: Diagnosis not present

## 2019-10-15 DIAGNOSIS — M7061 Trochanteric bursitis, right hip: Secondary | ICD-10-CM | POA: Diagnosis not present

## 2019-10-17 ENCOUNTER — Emergency Department (HOSPITAL_COMMUNITY)
Admission: EM | Admit: 2019-10-17 | Discharge: 2019-10-17 | Disposition: A | Discharge status: Home / Self Care | Payer: Medicare Other | Attending: Emergency Medicine | Admitting: Emergency Medicine

## 2019-10-17 ENCOUNTER — Emergency Department (HOSPITAL_COMMUNITY): Payer: Medicare Other

## 2019-10-17 ENCOUNTER — Other Ambulatory Visit: Payer: Self-pay

## 2019-10-17 DIAGNOSIS — Z87891 Personal history of nicotine dependence: Secondary | ICD-10-CM | POA: Insufficient documentation

## 2019-10-17 DIAGNOSIS — R000 Tachycardia, unspecified: Secondary | ICD-10-CM | POA: Diagnosis not present

## 2019-10-17 DIAGNOSIS — W010XXA Fall on same level from slipping, tripping and stumbling without subsequent striking against object, initial encounter: Secondary | ICD-10-CM | POA: Diagnosis not present

## 2019-10-17 DIAGNOSIS — Y92018 Other place in single-family (private) house as the place of occurrence of the external cause: Secondary | ICD-10-CM | POA: Insufficient documentation

## 2019-10-17 DIAGNOSIS — J45909 Unspecified asthma, uncomplicated: Secondary | ICD-10-CM | POA: Insufficient documentation

## 2019-10-17 DIAGNOSIS — Y998 Other external cause status: Secondary | ICD-10-CM | POA: Diagnosis not present

## 2019-10-17 DIAGNOSIS — Y9301 Activity, walking, marching and hiking: Secondary | ICD-10-CM | POA: Diagnosis not present

## 2019-10-17 DIAGNOSIS — M25551 Pain in right hip: Secondary | ICD-10-CM | POA: Insufficient documentation

## 2019-10-17 DIAGNOSIS — Z96641 Presence of right artificial hip joint: Secondary | ICD-10-CM | POA: Insufficient documentation

## 2019-10-17 DIAGNOSIS — I959 Hypotension, unspecified: Secondary | ICD-10-CM | POA: Diagnosis not present

## 2019-10-17 DIAGNOSIS — I10 Essential (primary) hypertension: Secondary | ICD-10-CM | POA: Diagnosis not present

## 2019-10-17 DIAGNOSIS — R0902 Hypoxemia: Secondary | ICD-10-CM | POA: Diagnosis not present

## 2019-10-17 DIAGNOSIS — S79911A Unspecified injury of right hip, initial encounter: Secondary | ICD-10-CM | POA: Diagnosis not present

## 2019-10-17 DIAGNOSIS — R52 Pain, unspecified: Secondary | ICD-10-CM | POA: Diagnosis not present

## 2019-10-17 DIAGNOSIS — W19XXXA Unspecified fall, initial encounter: Secondary | ICD-10-CM

## 2019-10-17 DIAGNOSIS — Z79899 Other long term (current) drug therapy: Secondary | ICD-10-CM | POA: Diagnosis not present

## 2019-10-17 MED ORDER — HYDROCODONE-ACETAMINOPHEN 5-325 MG PO TABS
1.0000 | ORAL_TABLET | Freq: Once | ORAL | Status: AC
  Administered 2019-10-17: 12:00:00 1 via ORAL
  Filled 2019-10-17: qty 1

## 2019-10-17 NOTE — Discharge Instructions (Addendum)
Please use a walker for stability.  Take pain medications as directed.  Please follow-up with your doctors.  I've put in an order to see if we can get a physical therapist out to evaluate your balance at home.  They should contact you in the next few days.

## 2019-10-17 NOTE — ED Notes (Signed)
PT took hydrocodone this morning btwn 0500-0600 am for the pain and last night.

## 2019-10-17 NOTE — ED Triage Notes (Signed)
Pt to ED from home with c/o of fall. Pt fell last night on her right hip, after tripping over her dog. Pt is not on any blood thinners and no loc. Pt is not complaining of neck head or any other pain but hip.

## 2019-10-17 NOTE — ED Provider Notes (Signed)
[facility] DEPT Provider Note   CSN: [id number] Arrival date & time: 10/17/19  4098     History Chief Complaint  Patient presents with  . Fall    [name] is a 74 y.o. female.  Patient presents to the emergency department with a chief complaint of right hip pain.  She states that she tripped over her dog in the night and fell to the ground.  She has history of right hip replacement and left femur fracture.  She has a states that she has had difficulty with ambulation since the fall.  She reports having taken Vicodin with some relief.  She denies numbness or tingling.  Denies head injury.  Denies any other injuries.  The history is provided by the patient. No language interpreter was used.       Past Medical History:  Diagnosis Date  . Allergy   . Anxiety   . Asthma   . Attention deficit disorder without mention of hyperactivity   . Back pain   . Colitis 12/05/2010  . Colon polyps   . Complication of anesthesia    problem with grogginess and a long time to get over Anesthesia  . Depression   . Diverticulosis   . GERD (gastroesophageal reflux disease)   . H/O hiatal hernia   . Hypertension   . IBS (irritable bowel syndrome)   . Osteoarthrosis, unspecified whether generalized or localized, unspecified site   . Other and unspecified hyperlipidemia   . Renal cyst 10/2015   simple left  . Thyroid disease   . Ulcerative colitis   . Unspecified asthma(493.90)   . Unspecified essential hypertension    taken off meds since lost wt    Patient Active Problem List   Diagnosis Date Noted  . Intertrochanteric fracture ([place]) 09/22/2019  . Bilateral carpal tunnel syndrome 12/01/2018  . Overweight (BMI 25.0-29.9) 07/08/2018  . 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 Status post right hip replacement 07/07/2018  . Acute encephalopathy 06/04/2018  . Preop exam for internal medicine 12/07/2017  . Chronic tonsillitis 12/05/2017  .  Postlaminectomy syndrome, lumbar region 07/03/2017  . Chronic lumbar radiculopathy 07/03/2017  . Shingles outbreak 11/28/2016  . Acute conjunctivitis of right eye 11/28/2016  . Vision blurred 11/28/2016  . Bilateral hearing loss 11/28/2016  . Nasal sore 11/28/2016  . Acute pharyngitis 07/03/2016  . UTI (urinary tract infection) 11/30/2015  . Abdominal pain 09/14/2015  . Nausea without vomiting 07/31/2015  . Chronic right hip pain 07/02/2015  . Sciatica of right side 06/30/2015  . Concussion without loss of consciousness 05/23/2015  . Low back pain radiating to left lower extremity 12/22/2014  . Dizziness 08/11/2014  . DDD (degenerative disc disease), lumbosacral 04/21/2014  . Renal cyst 01/28/2014  . Microscopic hematuria 04/20/2013  . Ulcerative colitis ([place]) 05/06/2012  . Spinal disease 03/29/2012  . Arthralgia 11/01/2011  . Facial laceration 07/29/2011  . Fall from standing 07/29/2011  . Left shoulder pain 07/29/2011  . Right wrist pain 07/29/2011  . Osteoarthritis 08/17/2010  . GERD 08/17/2010  . Dyslipidemia 02/19/2008  . ALLERGIC RHINITIS CAUSE UNSPECIFIED 05/26/2007  . Hypothyroidism 03/21/2007  . Anxiety state 03/21/2007  . Depression 03/21/2007  . Attention deficit disorder of adult 03/21/2007  . Essential hypertension 03/21/2007  . Asthma 03/21/2007  . Irritable bowel syndrome 03/21/2007    Past Surgical History:  Procedure Laterality Date  . ABDOMINAL EXPOSURE N/A 04/22/2014   Procedure: ABDOMINAL EXPOSURE;  Surgeon: Angelia Mould, MD;  Location: [facility];  Service: Vascular;  Laterality: N/A;  . ANTERIOR LATERAL LUMBAR FUSION 4 LEVELS Left 04/22/2014   Procedure: Left Lumbar one/two, two/three, three/four, four,five. Anterior lateral lumbar interbody fusion**Stage 1**;  Surgeon: Erline Levine, MD;  Location: [facility];  Service: Neurosurgery;  Laterality: Left;  . ANTERIOR LUMBAR FUSION N/A 04/22/2014   Procedure: Lumbar five/-Sacral one.  Anterior lumbar interbody fusion with Dr. Scot Dock; Left Lumbar one/two, two/three/three/four, four/five. Anterior lateral lumbar interbody fusion**Stage 1**;  Surgeon: Erline Levine, MD;  Location: [facility];  Service: Neurosurgery;  Laterality: N/A;  . BACK SURGERY     fluid removed from between disks  . BREAST SURGERY     bil breast reduction   . CHOLECYSTECTOMY     Laparoscopic cholecystectomy  . COLONOSCOPY    . HIP FRACTURE SURGERY     left  11/01/2013  . LUMBAR PERCUTANEOUS PEDICLE SCREW 4 LEVEL N/A 04/25/2014   Procedure: **Stage 2** Percutaneous pedicle screw placement L1-5;  Surgeon: Erline Levine, MD;  Location: [facility];  Service: Neurosurgery;  Laterality: N/A;  **Stage 2** Percutaneous pedicle screw placement L1-5  . SHOULDER SURGERY     right shoulder 3x   . TOE SURGERY     right big toe  . TOTAL HIP ARTHROPLASTY Right 07/07/2018   Procedure: TOTAL HIP ARTHROPLASTY ANTERIOR APPROACH;  Surgeon: Paralee Cancel, MD;  Location: [facility];  Service: Orthopedics;  Laterality: Right;  70 mins  . TOTAL SHOULDER ARTHROPLASTY    . UPPER GASTROINTESTINAL ENDOSCOPY  09/28/2016   Dilation     OB History   No obstetric history on file.     Family History  Problem Relation Age of Onset  . Hypertension Father   . Colon cancer Father 44  . Arthritis Father   . Heart disease Father        CHF  . Heart disease Mother        Died, 38  . Healthy Brother   . Healthy Daughter   . Diabetes Neg Hx   . Breast cancer Neg Hx     Social History   Tobacco Use  . Smoking status: Former Smoker    Quit date: 10/30/1978    Years since quitting: 40.9  . Smokeless tobacco: Never Used  Substance Use Topics  . Alcohol use: Yes    Alcohol/week: 1.0 standard drinks    Types: 1 Standard drinks or equivalent per week    Comment: once a week  . Drug use: No    Home Medications Prior to Admission medications   Medication Sig Start Date End Date Taking? Authorizing Provider  ALPRAZolam  (XANAX) 0.25 MG tablet Take 0.25 mg by mouth 2 (two) times daily as needed for anxiety or sleep.  02/19/19  Yes [provider]  amLODipine-valsartan (EXFORGE) 5-160 MG tablet Take 1 tablet by mouth daily. 05/11/18  Yes Plotnikov, Evie Lacks, MD  buPROPion (WELLBUTRIN XL) 300 MG 24 hr tablet Take 1 tablet (300 mg total) by mouth daily. 12/22/14  Yes Plotnikov, Evie Lacks, MD  Cholecalciferol (VITAMIN D PO) Take 1 tablet by mouth daily.   Yes [provider]  CONCERTA 36 MG CR tablet Take 36 mg by mouth daily.  12/06/14  Yes [provider]  Diclofenac-miSOPROStol 75-0.2 MG TBEC Take 1 tablet by mouth 2 (two) times daily. 09/16/19  Yes [provider]  FLUoxetine (PROZAC) 20 MG capsule Take 3 capsules (60 mg total) by mouth daily. 04/23/17  Yes Plotnikov, Tyrone Apple  V, MD  hydrochlorothiazide (MICROZIDE) 12.5 MG capsule Take 12.5 mg by mouth daily.    Yes [provider]  HYDROcodone-acetaminophen (NORCO) 7.5-325 MG tablet Take 1-2 tablets by mouth every 4 (four) hours as needed for moderate pain. 07/12/18  Yes Babish, Rodman Key, PA-C  lamoTRIgine (LAMICTAL) 100 MG tablet Take 100 mg by mouth 3 (three) times daily.  05/31/15  Yes [provider]  Menthol, Topical Analgesic, (BIOFREEZE) 4 % GEL Apply 1 application topically daily as needed (for pain).    Yes [provider]  methocarbamol (ROBAXIN) 500 MG tablet Take 1 tablet (500 mg total) by mouth every 6 (six) hours as needed for muscle spasms. 07/07/18  Yes Babish, Rodman Key, PA-C  pantoprazole (PROTONIX) 40 MG tablet TAKE ONE TABLET TWICE DAILY Patient taking differently: Take 40 mg by mouth 2 (two) times daily.  01/13/18  Yes Plotnikov, Evie Lacks, MD  rosuvastatin (CRESTOR) 10 MG tablet Take 10 mg by mouth daily.    Yes [provider]  SYNTHROID 50 MCG tablet TAKE 2 TABLETS EVERY MORNING Patient taking differently: Take 100 mcg by mouth daily before breakfast.  10/01/18  Yes Plotnikov, Evie Lacks, MD  VENTOLIN HFA 108 (90 Base) MCG/ACT inhaler ONE PUFF FOUR TIMES DAILY AS NEEDED Patient taking differently: Inhale 1 puff into the lungs every 6 (six) hours as needed for wheezing or shortness of breath.  10/12/17  Yes Plotnikov, Evie Lacks, MD  ALPRAZolam (XANAX) 0.5 MG tablet Take 1 tablet (0.5 mg total) by mouth 2 (two) times daily as needed for anxiety or sleep. Patient not taking: Reported on 04/06/2019 02/10/18   Plotnikov, Evie Lacks, MD  ferrous sulfate (FERROUSUL) 325 (65 FE) MG tablet Take 1 tablet (325 mg total) by mouth 3 (three) times daily with meals. Patient not taking: Reported on 04/06/2019 07/07/18   Danae Orleans, PA-C  polyethylene glycol (MIRALAX / GLYCOLAX) packet Take 17 g by mouth 2 (two) times daily. Patient not taking: Reported on 10/17/2019 07/07/18   Danae Orleans, PA-C  losartan-hydrochlorothiazide (HYZAAR) 50-12.5 MG per tablet Take 1 tablet by mouth daily.  09/04/11  [provider]  simvastatin (ZOCOR) 20 MG tablet Take 20 mg by mouth every evening.  09/04/11  [provider]    Allergies    Ampicillin, Erythromycin, Hctz [hydrochlorothiazide], Metoprolol, Molds & smuts, Morphine and related, and Penicillin g  Review of Systems   Review of Systems  All other systems reviewed and are negative.   Physical Exam Updated Vital Signs BP 137/69 (BP Location: Right Arm)   Pulse 63   Temp 97.7 F (36.5 C) (Oral)   Resp 15   Ht 5' 5"  (1.651 m)   Wt 70.3 kg   SpO2 98%   BMI 25.79 kg/m   Physical Exam Vitals and nursing note reviewed.  Constitutional:      General: She is not in acute distress.    Appearance: She is well-developed.  HENT:     Head: Normocephalic and atraumatic.  Eyes:     Conjunctiva/sclera: Conjunctivae normal.  Cardiovascular:     Rate and Rhythm: Normal rate and regular rhythm.     Heart sounds: No murmur.  Pulmonary:     Effort: Pulmonary effort is normal. No respiratory distress.     Breath sounds: Normal  breath sounds.  Abdominal:     Palpations: Abdomen is soft.     Tenderness: There is no abdominal tenderness.  Musculoskeletal:        General: Normal range of  motion.     Cervical back: Neck supple.     Comments: ROM and strength of bilateral hips is normal Able to ambulate and bear weight, but reports increased pain.  Skin:    General: Skin is warm and dry.  Neurological:     Mental Status: She is alert and oriented to person, place, and time.  Psychiatric:        Mood and Affect: Mood normal.        Behavior: Behavior normal.     ED Results / Procedures / Treatments   Labs (all labs ordered are listed, but only abnormal results are displayed) Labs Reviewed - No data to display  EKG None  Radiology DG Hip Unilat  With Pelvis 2-3 Views Right  Result Date: 10/17/2019 CLINICAL DATA:  Pain in right hip after fall last night. Right hip replacement 1 year ago. Left hip pending 5 years ago. EXAM: DG HIP (WITH OR WITHOUT PELVIS) 2-3V RIGHT COMPARISON:  None. FINDINGS: A gamma nail and intramedullary rod are seen in the proximal left hip, stable since August 29, 2017. Patient is status post right hip replacement. The acetabular and femoral components are in good position. No fracture or dislocation identified. Pedicle rods and screws are seen in the lower lumbar spine. IMPRESSION: No acute fracture or dislocation. Electronically Signed   By: Dorise Bullion III M.D   On: 10/17/2019 10:51    Procedures Procedures (including critical care time)  Medications Ordered in ED Medications  HYDROcodone-acetaminophen (NORCO/VICODIN) 5-325 MG per tablet 1 tablet (1 tablet Oral Given 10/17/19 1136)    ED Course  I have reviewed the triage vital signs and the nursing notes.  Pertinent labs & imaging results that were available during my care of the patient were reviewed by me and considered in my medical decision making (see chart for details).    MDM Rules/Calculators/A&P                       This patient complains of right hip pain after a fall this morning, this involves an extensive number of treatment options, and is a complaint that carries with it a high risk of complications and morbidity.  The differential diagnosis includes hip dislocation, fx, muscle strain, contusion.   I ordered medication norco for pain. I ordered imaging studies which included x-ray and CT imaging and I independently visualized and interpreted imaging which showed reveal no fracture or radioccult fx on CT.  No dislocation. Additional history obtained from daughter. Previous records obtained and reviewed and show prior right hip replacement by Dr. Alvan Dame.  Patient able to ambulate and bear weight, but says it's painful.  Advised patient to use walker at home.  After the interventions stated above, I reevaluated the patient and found able to ambulate.  She is agreeable with discharge plan and will use home walker.    Final Clinical Impression(s) / ED Diagnoses Final diagnoses:  Fall, initial encounter  Hip pain, acute, right    Rx / DC Orders ED Discharge Orders    None       Montine Circle, PA-C 10/17/19 1439    Davonna Belling, MD 10/17/19 [phone]

## 2019-10-17 NOTE — ED Notes (Signed)
PT placed on purewick, PT placed in position of comfort, pillow placed under right leg.

## 2019-10-25 DIAGNOSIS — S0181XA Laceration without foreign body of other part of head, initial encounter: Secondary | ICD-10-CM | POA: Diagnosis not present

## 2019-10-25 DIAGNOSIS — W19XXXA Unspecified fall, initial encounter: Secondary | ICD-10-CM | POA: Diagnosis not present

## 2019-10-25 DIAGNOSIS — S01112A Laceration without foreign body of left eyelid and periocular area, initial encounter: Secondary | ICD-10-CM | POA: Diagnosis not present

## 2019-11-02 DIAGNOSIS — S0181XD Laceration without foreign body of other part of head, subsequent encounter: Secondary | ICD-10-CM | POA: Diagnosis not present

## 2019-11-02 DIAGNOSIS — S01112D Laceration without foreign body of left eyelid and periocular area, subsequent encounter: Secondary | ICD-10-CM | POA: Diagnosis not present

## 2019-11-02 DIAGNOSIS — Z4802 Encounter for removal of sutures: Secondary | ICD-10-CM | POA: Diagnosis not present

## 2019-11-03 DIAGNOSIS — K5190 Ulcerative colitis, unspecified, without complications: Secondary | ICD-10-CM | POA: Diagnosis not present

## 2019-11-30 DIAGNOSIS — M810 Age-related osteoporosis without current pathological fracture: Secondary | ICD-10-CM | POA: Diagnosis not present

## 2019-12-07 DIAGNOSIS — L03115 Cellulitis of right lower limb: Secondary | ICD-10-CM | POA: Diagnosis not present

## 2019-12-10 ENCOUNTER — Encounter (HOSPITAL_COMMUNITY): Payer: Self-pay | Admitting: Emergency Medicine

## 2019-12-10 ENCOUNTER — Other Ambulatory Visit: Payer: Self-pay

## 2019-12-10 ENCOUNTER — Ambulatory Visit (HOSPITAL_COMMUNITY)
Admission: EM | Admit: 2019-12-10 | Discharge: 2019-12-10 | Disposition: A | Discharge status: Home / Self Care | Payer: Medicare Other

## 2019-12-10 DIAGNOSIS — W19XXXD Unspecified fall, subsequent encounter: Secondary | ICD-10-CM | POA: Diagnosis not present

## 2019-12-10 DIAGNOSIS — Z5189 Encounter for other specified aftercare: Secondary | ICD-10-CM | POA: Diagnosis not present

## 2019-12-10 NOTE — ED Triage Notes (Signed)
Pt here for increased leg weakness and some swelling in right leg; pt with wound to right leg that she is taking doxycyline for currently; pt with some falls recently

## 2019-12-10 NOTE — ED Provider Notes (Signed)
[place]    CSN: [id number] Arrival date & time: 12/10/19  1558      History   Chief Complaint Chief Complaint  Patient presents with  . Wound Check  . Leg Swelling    HPI [name] is a 74 y.o. female with past medical history of hypertension, anxiety, OA presents to urgent care with daughter for follow-up of cellulitis treated at urgent care on 12/07/19.  Patient states she called PCP office requesting to be seen for lower extremity swelling and weakness.  They were unable to work her into schedule today and sent her to urgent care for evaluation.  Patient and daughter report frequent falls over the past several months.  In fact, cellulitis was due to a scrape from either bumping into something or falling. Pt and daughter state redness around wound has improved since starting doxy.  Patient now living alone as she lost her husband 3 weeks ago..  Patient describes generalized lower extremity weakness.  She denies any recent fever, cough, chest pain, shortness of breath, N/V/D, no focal weakness, headache or dizziness.  Patient denies recent change in medications aside from recently prescribed doxycycline.   Past Medical History:  Diagnosis Date  . Allergy   . Anxiety   . Asthma   . Attention deficit disorder without mention of hyperactivity   . Back pain   . Colitis 12/05/2010  . Colon polyps   . Complication of anesthesia    problem with grogginess and a long time to get over Anesthesia  . Depression   . Diverticulosis   . GERD (gastroesophageal reflux disease)   . H/O hiatal hernia   . Hypertension   . IBS (irritable bowel syndrome)   . Osteoarthrosis, unspecified whether generalized or localized, unspecified site   . Other and unspecified hyperlipidemia   . Renal cyst 10/2015   simple left  . Thyroid disease   . Ulcerative colitis   . Unspecified asthma(493.90)   . Unspecified essential hypertension    taken off meds since lost wt     Patient Active Problem List   Diagnosis Date Noted  . Intertrochanteric fracture ([place]) 09/22/2019  . Bilateral carpal tunnel syndrome 12/01/2018  . Overweight (BMI 25.0-29.9) 07/08/2018  . 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 Status post right hip replacement 07/07/2018  . Acute encephalopathy 06/04/2018  . Preop exam for internal medicine 12/07/2017  . Chronic tonsillitis 12/05/2017  . Postlaminectomy syndrome, lumbar region 07/03/2017  . Chronic lumbar radiculopathy 07/03/2017  . Shingles outbreak 11/28/2016  . Acute conjunctivitis of right eye 11/28/2016  . Vision blurred 11/28/2016  . Bilateral hearing loss 11/28/2016  . Nasal sore 11/28/2016  . Acute pharyngitis 07/03/2016  . UTI (urinary tract infection) 11/30/2015  . Abdominal pain 09/14/2015  . Nausea without vomiting 07/31/2015  . Chronic right hip pain 07/02/2015  . Sciatica of right side 06/30/2015  . Concussion without loss of consciousness 05/23/2015  . Low back pain radiating to left lower extremity 12/22/2014  . Dizziness 08/11/2014  . DDD (degenerative disc disease), lumbosacral 04/21/2014  . Renal cyst 01/28/2014  . Microscopic hematuria 04/20/2013  . Ulcerative colitis ([place]) 05/06/2012  . Spinal disease 03/29/2012  . Arthralgia 11/01/2011  . Facial laceration 07/29/2011  . Fall from standing 07/29/2011  . Left shoulder pain 07/29/2011  . Right wrist pain 07/29/2011  . Osteoarthritis 08/17/2010  . GERD 08/17/2010  . Dyslipidemia 02/19/2008  . ALLERGIC RHINITIS CAUSE UNSPECIFIED 05/26/2007  . Hypothyroidism 03/21/2007  .  Anxiety state 03/21/2007  . Depression 03/21/2007  . Attention deficit disorder of adult 03/21/2007  . Essential hypertension 03/21/2007  . Asthma 03/21/2007  . Irritable bowel syndrome 03/21/2007    Past Surgical History:  Procedure Laterality Date  . ABDOMINAL EXPOSURE N/A 04/22/2014   Procedure: ABDOMINAL EXPOSURE;  Surgeon: Angelia Mould, MD;  Location: [facility];   Service: Vascular;  Laterality: N/A;  . ANTERIOR LATERAL LUMBAR FUSION 4 LEVELS Left 04/22/2014   Procedure: Left Lumbar one/two, two/three, three/four, four,five. Anterior lateral lumbar interbody fusion**Stage 1**;  Surgeon: Erline Levine, MD;  Location: [facility];  Service: Neurosurgery;  Laterality: Left;  . ANTERIOR LUMBAR FUSION N/A 04/22/2014   Procedure: Lumbar five/-Sacral one. Anterior lumbar interbody fusion with Dr. Scot Dock; Left Lumbar one/two, two/three/three/four, four/five. Anterior lateral lumbar interbody fusion**Stage 1**;  Surgeon: Erline Levine, MD;  Location: [facility];  Service: Neurosurgery;  Laterality: N/A;  . BACK SURGERY     fluid removed from between disks  . BREAST SURGERY     bil breast reduction   . CHOLECYSTECTOMY     Laparoscopic cholecystectomy  . COLONOSCOPY    . HIP FRACTURE SURGERY     left  11/01/2013  . LUMBAR PERCUTANEOUS PEDICLE SCREW 4 LEVEL N/A 04/25/2014   Procedure: **Stage 2** Percutaneous pedicle screw placement L1-5;  Surgeon: Erline Levine, MD;  Location: [facility];  Service: Neurosurgery;  Laterality: N/A;  **Stage 2** Percutaneous pedicle screw placement L1-5  . SHOULDER SURGERY     right shoulder 3x   . TOE SURGERY     right big toe  . TOTAL HIP ARTHROPLASTY Right 07/07/2018   Procedure: TOTAL HIP ARTHROPLASTY ANTERIOR APPROACH;  Surgeon: Paralee Cancel, MD;  Location: [facility];  Service: Orthopedics;  Laterality: Right;  70 mins  . TOTAL SHOULDER ARTHROPLASTY    . UPPER GASTROINTESTINAL ENDOSCOPY  09/28/2016   Dilation    OB History   No obstetric history on file.      Home Medications    Prior to Admission medications   Medication Sig Start Date End Date Taking? Authorizing Provider  ALPRAZolam (XANAX) 0.25 MG tablet Take 0.25 mg by mouth 2 (two) times daily as needed for anxiety or sleep.  02/19/19   [provider]  ALPRAZolam Duanne Moron) 0.5 MG tablet Take 1 tablet (0.5 mg total) by mouth 2 (two) times daily as needed  for anxiety or sleep. Patient not taking: Reported on 04/06/2019 02/10/18   Plotnikov, Evie Lacks, MD  amLODipine-valsartan (EXFORGE) 5-160 MG tablet Take 1 tablet by mouth daily. 05/11/18   Plotnikov, Evie Lacks, MD  buPROPion (WELLBUTRIN XL) 300 MG 24 hr tablet Take 1 tablet (300 mg total) by mouth daily. 12/22/14   Plotnikov, Evie Lacks, MD  Cholecalciferol (VITAMIN D PO) Take 1 tablet by mouth daily.    [provider]  CONCERTA 36 MG CR tablet Take 36 mg by mouth daily.  12/06/14   [provider]  Diclofenac-miSOPROStol 75-0.2 MG TBEC Take 1 tablet by mouth 2 (two) times daily. 09/16/19   [provider]  ferrous sulfate (FERROUSUL) 325 (65 FE) MG tablet Take 1 tablet (325 mg total) by mouth 3 (three) times daily with meals. Patient not taking: Reported on 04/06/2019 07/07/18   Danae Orleans, PA-C  FLUoxetine (PROZAC) 20 MG capsule Take 3 capsules (60 mg total) by mouth daily. 04/23/17   Plotnikov, Evie Lacks, MD  hydrochlorothiazide (MICROZIDE) 12.5 MG capsule Take 12.5 mg by mouth daily.  [provider]  HYDROcodone-acetaminophen (NORCO) 7.5-325 MG tablet Take 1-2 tablets by mouth every 4 (four) hours as needed for moderate pain. 07/12/18   Danae Orleans, PA-C  lamoTRIgine (LAMICTAL) 100 MG tablet Take 100 mg by mouth 3 (three) times daily.  05/31/15   [provider]  Menthol, Topical Analgesic, (BIOFREEZE) 4 % GEL Apply 1 application topically daily as needed (for pain).     [provider]  methocarbamol (ROBAXIN) 500 MG tablet Take 1 tablet (500 mg total) by mouth every 6 (six) hours as needed for muscle spasms. 07/07/18   Danae Orleans, PA-C  pantoprazole (PROTONIX) 40 MG tablet TAKE ONE TABLET TWICE DAILY Patient taking differently: Take 40 mg by mouth 2 (two) times daily.  01/13/18   Plotnikov, Evie Lacks, MD  polyethylene glycol (MIRALAX / GLYCOLAX) packet Take 17 g by mouth 2 (two) times daily. Patient not taking: Reported on  10/17/2019 07/07/18   Danae Orleans, PA-C  rosuvastatin (CRESTOR) 10 MG tablet Take 10 mg by mouth daily.     [provider]  SYNTHROID 50 MCG tablet TAKE 2 TABLETS EVERY MORNING Patient taking differently: Take 100 mcg by mouth daily before breakfast.  10/01/18   Plotnikov, Evie Lacks, MD  VENTOLIN HFA 108 (90 Base) MCG/ACT inhaler ONE PUFF FOUR TIMES DAILY AS NEEDED Patient taking differently: Inhale 1 puff into the lungs every 6 (six) hours as needed for wheezing or shortness of breath.  10/12/17   Plotnikov, Evie Lacks, MD  losartan-hydrochlorothiazide (HYZAAR) 50-12.5 MG per tablet Take 1 tablet by mouth daily.  09/04/11  [provider]  simvastatin (ZOCOR) 20 MG tablet Take 20 mg by mouth every evening.  09/04/11  [provider]    Family History Family History  Problem Relation Age of Onset  . Hypertension Father   . Colon cancer Father 51  . Arthritis Father   . Heart disease Father        CHF  . Heart disease Mother        Died, 49  . Healthy Brother   . Healthy Daughter   . Diabetes Neg Hx   . Breast cancer Neg Hx     Social History Social History   Tobacco Use  . Smoking status: Former Smoker    Quit date: 10/30/1978    Years since quitting: 41.1  . Smokeless tobacco: Never Used  Vaping Use  . Vaping Use: Never used  Substance Use Topics  . Alcohol use: Yes    Alcohol/week: 1.0 standard drink    Types: 1 Standard drinks or equivalent per week    Comment: once a week  . Drug use: No     Allergies   Ampicillin, Erythromycin, Hctz [hydrochlorothiazide], Metoprolol, Molds & smuts, Morphine and related, and Penicillin g   Review of Systems As stated in HPI otherwise negative   Physical Exam Triage Vital Signs ED Triage Vitals  Enc Vitals Group     BP 12/10/19 1715 (!) 159/75     Pulse Rate 12/10/19 1715 74     Resp 12/10/19 1715 18     Temp 12/10/19 1715 98.3 F (36.8 C)     Temp Source 12/10/19 1715 Oral     SpO2 12/10/19 1715  95 %     Weight --      Height --      Head Circumference --      Peak Flow --      Pain Score 12/10/19 1716 0  Pain Loc --      Pain Edu? --      Excl. in [place]? --    No data found.  Updated Vital Signs BP (!) 159/75 (BP Location: Right Arm)   Pulse 74   Temp 98.3 F (36.8 C) (Oral)   Resp 18   SpO2 95%    Physical Exam Constitutional:      General: She is not in acute distress.    Appearance: Normal appearance. She is not ill-appearing.  HENT:     Head: Normocephalic and atraumatic.     Mouth/Throat:     Mouth: Mucous membranes are moist.     Pharynx: Oropharynx is clear.  Eyes:     Extraocular Movements: Extraocular movements intact.     Pupils: Pupils are equal, round, and reactive to light.  Cardiovascular:     Rate and Rhythm: Normal rate and regular rhythm.     Pulses: Normal pulses.     Heart sounds: Normal heart sounds. No murmur heard.  No friction rub. No gallop.   Pulmonary:     Effort: Pulmonary effort is normal.     Breath sounds: Normal breath sounds.  Abdominal:     General: Bowel sounds are normal.     Palpations: Abdomen is soft.  Musculoskeletal:     Cervical back: Normal range of motion and neck supple.     Comments: Mild swelling to right distal tibial area. Nontender, normal rom, no erythema.   Skin:    General: Skin is warm and dry.     Comments: Superficial wound to right lateral tibial area now scabbing over, no drainage or odor. Mild erythema surrounding wound.   Neurological:     General: No focal deficit present.     Mental Status: She is alert and oriented to person, place, and time. Mental status is at baseline.     Motor: No weakness.     Deep Tendon Reflexes: Reflexes normal.     Comments: No facial asymmetry, tongue midline, no pronator drift. 5/5 strength upper and lower extremities  Psychiatric:        Mood and Affect: Mood normal.        Behavior: Behavior normal.     UC Treatments / Results  Labs (all labs ordered  are listed, but only abnormal results are displayed) Labs Reviewed - No data to display  EKG   Radiology No results found.  Procedures Procedures (including critical care time)  Medications Ordered in UC Medications - No data to display  Initial Impression / Assessment and Plan / UC Course  I have reviewed the triage vital signs and the nursing notes.  Pertinent labs & imaging results that were available during my care of the patient were reviewed by me and considered in my medical decision making (see chart for details).  Recent cellulitis d/t scrape  -right lower leg. Started on doxy at another UC on 12/07/19. Wound and surrounding cellulitis significantly improved since then -Pt instructed to complete entire course of antibx -Mild swelling just below wound likely a result of above. No evidence of fracture/sprain  Weakness, falls -progressive over last several months with no acute issues appearing to be cause.  -no focal findings on neuro exam suggesting need for emergent imaging -f/u PCP to discuss PT -rolling walker for now. Pt has one at home  Final Clinical Impressions(s) / UC Diagnoses   Final diagnoses:  Visit for wound check  Fall, subsequent encounter     Discharge  Instructions     Use rolling walker for now. I want you to be very careful. Call Dr Raul Del office on Monday to make an appointment and discuss physical therapy.    ED Prescriptions    None     PDMP not reviewed this encounter.   Rudolpho Sevin, NP 12/12/19 1014

## 2019-12-10 NOTE — Discharge Instructions (Addendum)
Use rolling walker for now. I want you to be very careful. Call Dr Raul Del office on Monday to make an appointment and discuss physical therapy.

## 2020-01-05 ENCOUNTER — Other Ambulatory Visit (HOSPITAL_COMMUNITY): Payer: Self-pay | Admitting: *Deleted

## 2020-01-06 ENCOUNTER — Inpatient Hospital Stay (HOSPITAL_COMMUNITY): Admission: RE | Admit: 2020-01-06 | Payer: Medicare Other | Source: Ambulatory Visit

## 2020-01-07 DIAGNOSIS — Z9181 History of falling: Secondary | ICD-10-CM | POA: Diagnosis not present

## 2020-01-07 DIAGNOSIS — F418 Other specified anxiety disorders: Secondary | ICD-10-CM | POA: Diagnosis not present

## 2020-01-07 DIAGNOSIS — M6281 Muscle weakness (generalized): Secondary | ICD-10-CM | POA: Diagnosis not present

## 2020-01-07 DIAGNOSIS — E785 Hyperlipidemia, unspecified: Secondary | ICD-10-CM | POA: Diagnosis not present

## 2020-01-07 DIAGNOSIS — I10 Essential (primary) hypertension: Secondary | ICD-10-CM | POA: Diagnosis not present

## 2020-01-07 DIAGNOSIS — E039 Hypothyroidism, unspecified: Secondary | ICD-10-CM | POA: Diagnosis not present

## 2020-01-07 DIAGNOSIS — Z79899 Other long term (current) drug therapy: Secondary | ICD-10-CM | POA: Diagnosis not present

## 2020-01-07 DIAGNOSIS — M810 Age-related osteoporosis without current pathological fracture: Secondary | ICD-10-CM | POA: Diagnosis not present

## 2020-01-07 DIAGNOSIS — Z87891 Personal history of nicotine dependence: Secondary | ICD-10-CM | POA: Diagnosis not present

## 2020-01-07 DIAGNOSIS — J45909 Unspecified asthma, uncomplicated: Secondary | ICD-10-CM | POA: Diagnosis not present

## 2020-01-07 DIAGNOSIS — M170 Bilateral primary osteoarthritis of knee: Secondary | ICD-10-CM | POA: Diagnosis not present

## 2020-01-07 DIAGNOSIS — R296 Repeated falls: Secondary | ICD-10-CM | POA: Diagnosis not present

## 2020-01-07 DIAGNOSIS — Z96641 Presence of right artificial hip joint: Secondary | ICD-10-CM | POA: Diagnosis not present

## 2020-01-12 DIAGNOSIS — M170 Bilateral primary osteoarthritis of knee: Secondary | ICD-10-CM | POA: Diagnosis not present

## 2020-01-12 DIAGNOSIS — I10 Essential (primary) hypertension: Secondary | ICD-10-CM | POA: Diagnosis not present

## 2020-01-12 DIAGNOSIS — R296 Repeated falls: Secondary | ICD-10-CM | POA: Diagnosis not present

## 2020-01-12 DIAGNOSIS — F418 Other specified anxiety disorders: Secondary | ICD-10-CM | POA: Diagnosis not present

## 2020-01-12 DIAGNOSIS — M6281 Muscle weakness (generalized): Secondary | ICD-10-CM | POA: Diagnosis not present

## 2020-01-12 DIAGNOSIS — M810 Age-related osteoporosis without current pathological fracture: Secondary | ICD-10-CM | POA: Diagnosis not present

## 2020-01-14 DIAGNOSIS — M810 Age-related osteoporosis without current pathological fracture: Secondary | ICD-10-CM | POA: Diagnosis not present

## 2020-01-14 DIAGNOSIS — M6281 Muscle weakness (generalized): Secondary | ICD-10-CM | POA: Diagnosis not present

## 2020-01-14 DIAGNOSIS — I10 Essential (primary) hypertension: Secondary | ICD-10-CM | POA: Diagnosis not present

## 2020-01-14 DIAGNOSIS — R296 Repeated falls: Secondary | ICD-10-CM | POA: Diagnosis not present

## 2020-01-14 DIAGNOSIS — F418 Other specified anxiety disorders: Secondary | ICD-10-CM | POA: Diagnosis not present

## 2020-01-14 DIAGNOSIS — M170 Bilateral primary osteoarthritis of knee: Secondary | ICD-10-CM | POA: Diagnosis not present

## 2020-01-18 DIAGNOSIS — M810 Age-related osteoporosis without current pathological fracture: Secondary | ICD-10-CM | POA: Diagnosis not present

## 2020-01-18 DIAGNOSIS — I10 Essential (primary) hypertension: Secondary | ICD-10-CM | POA: Diagnosis not present

## 2020-01-18 DIAGNOSIS — M6281 Muscle weakness (generalized): Secondary | ICD-10-CM | POA: Diagnosis not present

## 2020-01-18 DIAGNOSIS — R296 Repeated falls: Secondary | ICD-10-CM | POA: Diagnosis not present

## 2020-01-18 DIAGNOSIS — F418 Other specified anxiety disorders: Secondary | ICD-10-CM | POA: Diagnosis not present

## 2020-01-18 DIAGNOSIS — M170 Bilateral primary osteoarthritis of knee: Secondary | ICD-10-CM | POA: Diagnosis not present

## 2020-01-19 DIAGNOSIS — K5190 Ulcerative colitis, unspecified, without complications: Secondary | ICD-10-CM | POA: Diagnosis not present

## 2020-01-20 DIAGNOSIS — M170 Bilateral primary osteoarthritis of knee: Secondary | ICD-10-CM | POA: Diagnosis not present

## 2020-01-20 DIAGNOSIS — F418 Other specified anxiety disorders: Secondary | ICD-10-CM | POA: Diagnosis not present

## 2020-01-20 DIAGNOSIS — M810 Age-related osteoporosis without current pathological fracture: Secondary | ICD-10-CM | POA: Diagnosis not present

## 2020-01-20 DIAGNOSIS — R296 Repeated falls: Secondary | ICD-10-CM | POA: Diagnosis not present

## 2020-01-20 DIAGNOSIS — M6281 Muscle weakness (generalized): Secondary | ICD-10-CM | POA: Diagnosis not present

## 2020-01-20 DIAGNOSIS — I10 Essential (primary) hypertension: Secondary | ICD-10-CM | POA: Diagnosis not present

## 2020-01-26 DIAGNOSIS — F418 Other specified anxiety disorders: Secondary | ICD-10-CM | POA: Diagnosis not present

## 2020-01-26 DIAGNOSIS — M170 Bilateral primary osteoarthritis of knee: Secondary | ICD-10-CM | POA: Diagnosis not present

## 2020-01-26 DIAGNOSIS — I10 Essential (primary) hypertension: Secondary | ICD-10-CM | POA: Diagnosis not present

## 2020-01-26 DIAGNOSIS — M6281 Muscle weakness (generalized): Secondary | ICD-10-CM | POA: Diagnosis not present

## 2020-01-26 DIAGNOSIS — M810 Age-related osteoporosis without current pathological fracture: Secondary | ICD-10-CM | POA: Diagnosis not present

## 2020-01-26 DIAGNOSIS — R296 Repeated falls: Secondary | ICD-10-CM | POA: Diagnosis not present

## 2020-01-28 DIAGNOSIS — M6281 Muscle weakness (generalized): Secondary | ICD-10-CM | POA: Diagnosis not present

## 2020-01-28 DIAGNOSIS — R296 Repeated falls: Secondary | ICD-10-CM | POA: Diagnosis not present

## 2020-01-28 DIAGNOSIS — I10 Essential (primary) hypertension: Secondary | ICD-10-CM | POA: Diagnosis not present

## 2020-01-28 DIAGNOSIS — M170 Bilateral primary osteoarthritis of knee: Secondary | ICD-10-CM | POA: Diagnosis not present

## 2020-01-28 DIAGNOSIS — F418 Other specified anxiety disorders: Secondary | ICD-10-CM | POA: Diagnosis not present

## 2020-01-28 DIAGNOSIS — M810 Age-related osteoporosis without current pathological fracture: Secondary | ICD-10-CM | POA: Diagnosis not present

## 2020-01-31 DIAGNOSIS — F418 Other specified anxiety disorders: Secondary | ICD-10-CM | POA: Diagnosis not present

## 2020-01-31 DIAGNOSIS — R296 Repeated falls: Secondary | ICD-10-CM | POA: Diagnosis not present

## 2020-01-31 DIAGNOSIS — M170 Bilateral primary osteoarthritis of knee: Secondary | ICD-10-CM | POA: Diagnosis not present

## 2020-01-31 DIAGNOSIS — I10 Essential (primary) hypertension: Secondary | ICD-10-CM | POA: Diagnosis not present

## 2020-01-31 DIAGNOSIS — M810 Age-related osteoporosis without current pathological fracture: Secondary | ICD-10-CM | POA: Diagnosis not present

## 2020-01-31 DIAGNOSIS — M6281 Muscle weakness (generalized): Secondary | ICD-10-CM | POA: Diagnosis not present

## 2020-02-03 DIAGNOSIS — M810 Age-related osteoporosis without current pathological fracture: Secondary | ICD-10-CM | POA: Diagnosis not present

## 2020-02-03 DIAGNOSIS — M170 Bilateral primary osteoarthritis of knee: Secondary | ICD-10-CM | POA: Diagnosis not present

## 2020-02-03 DIAGNOSIS — F418 Other specified anxiety disorders: Secondary | ICD-10-CM | POA: Diagnosis not present

## 2020-02-03 DIAGNOSIS — R296 Repeated falls: Secondary | ICD-10-CM | POA: Diagnosis not present

## 2020-02-03 DIAGNOSIS — I10 Essential (primary) hypertension: Secondary | ICD-10-CM | POA: Diagnosis not present

## 2020-02-03 DIAGNOSIS — M6281 Muscle weakness (generalized): Secondary | ICD-10-CM | POA: Diagnosis not present

## 2020-02-06 DIAGNOSIS — E039 Hypothyroidism, unspecified: Secondary | ICD-10-CM | POA: Diagnosis not present

## 2020-02-06 DIAGNOSIS — M810 Age-related osteoporosis without current pathological fracture: Secondary | ICD-10-CM | POA: Diagnosis not present

## 2020-02-06 DIAGNOSIS — Z9181 History of falling: Secondary | ICD-10-CM | POA: Diagnosis not present

## 2020-02-06 DIAGNOSIS — E785 Hyperlipidemia, unspecified: Secondary | ICD-10-CM | POA: Diagnosis not present

## 2020-02-06 DIAGNOSIS — Z79899 Other long term (current) drug therapy: Secondary | ICD-10-CM | POA: Diagnosis not present

## 2020-02-06 DIAGNOSIS — F418 Other specified anxiety disorders: Secondary | ICD-10-CM | POA: Diagnosis not present

## 2020-02-06 DIAGNOSIS — I10 Essential (primary) hypertension: Secondary | ICD-10-CM | POA: Diagnosis not present

## 2020-02-06 DIAGNOSIS — Z87891 Personal history of nicotine dependence: Secondary | ICD-10-CM | POA: Diagnosis not present

## 2020-02-06 DIAGNOSIS — Z96641 Presence of right artificial hip joint: Secondary | ICD-10-CM | POA: Diagnosis not present

## 2020-02-06 DIAGNOSIS — J45909 Unspecified asthma, uncomplicated: Secondary | ICD-10-CM | POA: Diagnosis not present

## 2020-02-06 DIAGNOSIS — R296 Repeated falls: Secondary | ICD-10-CM | POA: Diagnosis not present

## 2020-02-06 DIAGNOSIS — M6281 Muscle weakness (generalized): Secondary | ICD-10-CM | POA: Diagnosis not present

## 2020-02-06 DIAGNOSIS — M170 Bilateral primary osteoarthritis of knee: Secondary | ICD-10-CM | POA: Diagnosis not present

## 2020-02-08 DIAGNOSIS — R296 Repeated falls: Secondary | ICD-10-CM | POA: Diagnosis not present

## 2020-02-08 DIAGNOSIS — M6281 Muscle weakness (generalized): Secondary | ICD-10-CM | POA: Diagnosis not present

## 2020-02-08 DIAGNOSIS — M170 Bilateral primary osteoarthritis of knee: Secondary | ICD-10-CM | POA: Diagnosis not present

## 2020-02-08 DIAGNOSIS — F418 Other specified anxiety disorders: Secondary | ICD-10-CM | POA: Diagnosis not present

## 2020-02-08 DIAGNOSIS — M810 Age-related osteoporosis without current pathological fracture: Secondary | ICD-10-CM | POA: Diagnosis not present

## 2020-02-08 DIAGNOSIS — I10 Essential (primary) hypertension: Secondary | ICD-10-CM | POA: Diagnosis not present

## 2020-02-18 DIAGNOSIS — R296 Repeated falls: Secondary | ICD-10-CM | POA: Diagnosis not present

## 2020-02-18 DIAGNOSIS — M6281 Muscle weakness (generalized): Secondary | ICD-10-CM | POA: Diagnosis not present

## 2020-02-18 DIAGNOSIS — M170 Bilateral primary osteoarthritis of knee: Secondary | ICD-10-CM | POA: Diagnosis not present

## 2020-02-18 DIAGNOSIS — I10 Essential (primary) hypertension: Secondary | ICD-10-CM | POA: Diagnosis not present

## 2020-02-18 DIAGNOSIS — F418 Other specified anxiety disorders: Secondary | ICD-10-CM | POA: Diagnosis not present

## 2020-02-18 DIAGNOSIS — M810 Age-related osteoporosis without current pathological fracture: Secondary | ICD-10-CM | POA: Diagnosis not present

## 2020-02-20 ENCOUNTER — Other Ambulatory Visit: Payer: Self-pay | Admitting: Nurse Practitioner

## 2020-02-20 DIAGNOSIS — K5100 Ulcerative (chronic) pancolitis without complications: Secondary | ICD-10-CM

## 2020-02-20 DIAGNOSIS — I10 Essential (primary) hypertension: Secondary | ICD-10-CM

## 2020-02-20 DIAGNOSIS — U071 COVID-19: Secondary | ICD-10-CM

## 2020-02-20 NOTE — Progress Notes (Signed)
I connected by phone with [name] on 02/20/2020 at 5:21 PM to discuss the potential use of a new treatment for mild to moderate COVID-19 viral infection in non-hospitalized patients.  This patient is a 74 y.o. female that meets the FDA criteria for Emergency Use Authorization of COVID monoclonal antibody casirivimab/imdevimab.  Has a (+) direct SARS-CoV-2 viral test result  Has mild or moderate COVID-19   Is NOT hospitalized due to COVID-19  Is within 10 days of symptom onset  Has at least one of the high risk factor(s) for progression to severe COVID-19 and/or hospitalization as defined in EUA.  Specific high risk criteria : Older age (>/= 74 yo), Immunosuppressive Disease or Treatment and Cardiovascular disease or hypertension   I have spoken and communicated the following to the patient or parent/caregiver regarding COVID monoclonal antibody treatment:  1. FDA has authorized the emergency use for the treatment of mild to moderate COVID-19 in adults and pediatric patients with positive results of direct SARS-CoV-2 viral testing who are 64 years of age and older weighing at least 40 kg, and who are at high risk for progressing to severe COVID-19 and/or hospitalization.  2. The significant known and potential risks and benefits of COVID monoclonal antibody, and the extent to which such potential risks and benefits are unknown.  3. Information on available alternative treatments and the risks and benefits of those alternatives, including clinical trials.  4. Patients treated with COVID monoclonal antibody should continue to self-isolate and use infection control measures (e.g., wear mask, isolate, social distance, avoid sharing personal items, clean and disinfect "high touch" surfaces, and frequent handwashing) according to CDC guidelines.   5. The patient or parent/caregiver has the option to accept or refuse COVID monoclonal antibody treatment.  After reviewing this  information with the patient, The patient agreed to proceed with receiving casirivimab\imdevimab infusion and will be provided a copy of the Fact sheet prior to receiving the infusion.  Sx onset 02/18/20. Set up for infusion on 02/21/20. Directions given to [facility]. Pt is aware that insurance will be charged an infusion fee.   Ranae Pila 02/20/2020 5:21 PM

## 2020-02-21 ENCOUNTER — Ambulatory Visit (HOSPITAL_COMMUNITY)
Admission: RE | Admit: 2020-02-21 | Discharge: 2020-02-21 | Disposition: A | Discharge status: Home / Self Care | Payer: Medicare Other | Source: Ambulatory Visit | Attending: Pulmonary Disease | Admitting: Pulmonary Disease

## 2020-02-21 DIAGNOSIS — K5100 Ulcerative (chronic) pancolitis without complications: Secondary | ICD-10-CM | POA: Insufficient documentation

## 2020-02-21 DIAGNOSIS — U071 COVID-19: Secondary | ICD-10-CM | POA: Diagnosis not present

## 2020-02-21 DIAGNOSIS — Z23 Encounter for immunization: Secondary | ICD-10-CM | POA: Diagnosis not present

## 2020-02-21 DIAGNOSIS — I10 Essential (primary) hypertension: Secondary | ICD-10-CM | POA: Diagnosis not present

## 2020-02-21 MED ORDER — SODIUM CHLORIDE 0.9 % IV SOLN: INTRAVENOUS | Status: DC | PRN

## 2020-02-21 MED ORDER — SODIUM CHLORIDE 0.9 % IV SOLN
1200.0000 mg | Freq: Once | INTRAVENOUS | Status: AC
  Administered 2020-02-21: 1200 mg via INTRAVENOUS

## 2020-02-21 MED ORDER — DIPHENHYDRAMINE HCL 50 MG/ML IJ SOLN: 50.0000 mg | Freq: Once | INTRAMUSCULAR | Status: DC | PRN

## 2020-02-21 MED ORDER — EPINEPHRINE 0.3 MG/0.3ML IJ SOAJ: 0.3000 mg | Freq: Once | INTRAMUSCULAR | Status: DC | PRN

## 2020-02-21 MED ORDER — ALBUTEROL SULFATE HFA 108 (90 BASE) MCG/ACT IN AERS: 2.0000 | INHALATION_SPRAY | Freq: Once | RESPIRATORY_TRACT | Status: DC | PRN

## 2020-02-21 MED ORDER — METHYLPREDNISOLONE SODIUM SUCC 125 MG IJ SOLR: 125.0000 mg | Freq: Once | INTRAMUSCULAR | Status: DC | PRN

## 2020-02-21 MED ORDER — FAMOTIDINE IN NACL 20-0.9 MG/50ML-% IV SOLN: 20.0000 mg | Freq: Once | INTRAVENOUS | Status: DC | PRN

## 2020-02-21 NOTE — Discharge Instructions (Signed)

## 2020-02-21 NOTE — Progress Notes (Signed)
  Diagnosis: COVID-19  Physician: Dr Asencion Noble  Procedure: [facility] Med: casirivimab\imdevimab infusion - Provided patient with casirivimab\imdevimab fact sheet for patients, parents and caregivers prior to infusion.  Complications: No immediate complications noted.  Discharge: Discharged home   [name] 02/21/2020

## 2020-03-06 DIAGNOSIS — M6281 Muscle weakness (generalized): Secondary | ICD-10-CM | POA: Diagnosis not present

## 2020-03-06 DIAGNOSIS — R296 Repeated falls: Secondary | ICD-10-CM | POA: Diagnosis not present

## 2020-03-06 DIAGNOSIS — M170 Bilateral primary osteoarthritis of knee: Secondary | ICD-10-CM | POA: Diagnosis not present

## 2020-03-06 DIAGNOSIS — I10 Essential (primary) hypertension: Secondary | ICD-10-CM | POA: Diagnosis not present

## 2020-03-06 DIAGNOSIS — F418 Other specified anxiety disorders: Secondary | ICD-10-CM | POA: Diagnosis not present

## 2020-03-06 DIAGNOSIS — M810 Age-related osteoporosis without current pathological fracture: Secondary | ICD-10-CM | POA: Diagnosis not present

## 2020-03-07 DIAGNOSIS — R296 Repeated falls: Secondary | ICD-10-CM | POA: Diagnosis not present

## 2020-03-07 DIAGNOSIS — Z9181 History of falling: Secondary | ICD-10-CM | POA: Diagnosis not present

## 2020-03-07 DIAGNOSIS — Z96641 Presence of right artificial hip joint: Secondary | ICD-10-CM | POA: Diagnosis not present

## 2020-03-07 DIAGNOSIS — E039 Hypothyroidism, unspecified: Secondary | ICD-10-CM | POA: Diagnosis not present

## 2020-03-07 DIAGNOSIS — I10 Essential (primary) hypertension: Secondary | ICD-10-CM | POA: Diagnosis not present

## 2020-03-07 DIAGNOSIS — Z79899 Other long term (current) drug therapy: Secondary | ICD-10-CM | POA: Diagnosis not present

## 2020-03-07 DIAGNOSIS — Z87891 Personal history of nicotine dependence: Secondary | ICD-10-CM | POA: Diagnosis not present

## 2020-03-07 DIAGNOSIS — F418 Other specified anxiety disorders: Secondary | ICD-10-CM | POA: Diagnosis not present

## 2020-03-07 DIAGNOSIS — E785 Hyperlipidemia, unspecified: Secondary | ICD-10-CM | POA: Diagnosis not present

## 2020-03-07 DIAGNOSIS — M810 Age-related osteoporosis without current pathological fracture: Secondary | ICD-10-CM | POA: Diagnosis not present

## 2020-03-07 DIAGNOSIS — M170 Bilateral primary osteoarthritis of knee: Secondary | ICD-10-CM | POA: Diagnosis not present

## 2020-03-07 DIAGNOSIS — U071 COVID-19: Secondary | ICD-10-CM | POA: Diagnosis not present

## 2020-03-07 DIAGNOSIS — J45909 Unspecified asthma, uncomplicated: Secondary | ICD-10-CM | POA: Diagnosis not present

## 2020-03-10 DIAGNOSIS — R296 Repeated falls: Secondary | ICD-10-CM | POA: Diagnosis not present

## 2020-03-10 DIAGNOSIS — U071 COVID-19: Secondary | ICD-10-CM | POA: Diagnosis not present

## 2020-03-10 DIAGNOSIS — M810 Age-related osteoporosis without current pathological fracture: Secondary | ICD-10-CM | POA: Diagnosis not present

## 2020-03-10 DIAGNOSIS — F418 Other specified anxiety disorders: Secondary | ICD-10-CM | POA: Diagnosis not present

## 2020-03-10 DIAGNOSIS — I10 Essential (primary) hypertension: Secondary | ICD-10-CM | POA: Diagnosis not present

## 2020-03-10 DIAGNOSIS — M170 Bilateral primary osteoarthritis of knee: Secondary | ICD-10-CM | POA: Diagnosis not present

## 2020-03-14 DIAGNOSIS — U071 COVID-19: Secondary | ICD-10-CM | POA: Diagnosis not present

## 2020-03-14 DIAGNOSIS — F418 Other specified anxiety disorders: Secondary | ICD-10-CM | POA: Diagnosis not present

## 2020-03-14 DIAGNOSIS — M170 Bilateral primary osteoarthritis of knee: Secondary | ICD-10-CM | POA: Diagnosis not present

## 2020-03-14 DIAGNOSIS — I10 Essential (primary) hypertension: Secondary | ICD-10-CM | POA: Diagnosis not present

## 2020-03-14 DIAGNOSIS — M810 Age-related osteoporosis without current pathological fracture: Secondary | ICD-10-CM | POA: Diagnosis not present

## 2020-03-14 DIAGNOSIS — R296 Repeated falls: Secondary | ICD-10-CM | POA: Diagnosis not present

## 2020-03-15 DIAGNOSIS — M25569 Pain in unspecified knee: Secondary | ICD-10-CM | POA: Diagnosis not present

## 2020-03-15 DIAGNOSIS — M1990 Unspecified osteoarthritis, unspecified site: Secondary | ICD-10-CM | POA: Diagnosis not present

## 2020-03-15 DIAGNOSIS — M79643 Pain in unspecified hand: Secondary | ICD-10-CM | POA: Diagnosis not present

## 2020-03-15 DIAGNOSIS — M0760 Enteropathic arthropathies, unspecified site: Secondary | ICD-10-CM | POA: Diagnosis not present

## 2020-03-15 DIAGNOSIS — K5190 Ulcerative colitis, unspecified, without complications: Secondary | ICD-10-CM | POA: Diagnosis not present

## 2020-03-15 DIAGNOSIS — Z79899 Other long term (current) drug therapy: Secondary | ICD-10-CM | POA: Diagnosis not present

## 2020-03-16 DIAGNOSIS — R296 Repeated falls: Secondary | ICD-10-CM | POA: Diagnosis not present

## 2020-03-16 DIAGNOSIS — U071 COVID-19: Secondary | ICD-10-CM | POA: Diagnosis not present

## 2020-03-16 DIAGNOSIS — F418 Other specified anxiety disorders: Secondary | ICD-10-CM | POA: Diagnosis not present

## 2020-03-16 DIAGNOSIS — I10 Essential (primary) hypertension: Secondary | ICD-10-CM | POA: Diagnosis not present

## 2020-03-16 DIAGNOSIS — M170 Bilateral primary osteoarthritis of knee: Secondary | ICD-10-CM | POA: Diagnosis not present

## 2020-03-16 DIAGNOSIS — M810 Age-related osteoporosis without current pathological fracture: Secondary | ICD-10-CM | POA: Diagnosis not present

## 2020-03-20 DIAGNOSIS — M170 Bilateral primary osteoarthritis of knee: Secondary | ICD-10-CM | POA: Diagnosis not present

## 2020-03-20 DIAGNOSIS — M810 Age-related osteoporosis without current pathological fracture: Secondary | ICD-10-CM | POA: Diagnosis not present

## 2020-03-20 DIAGNOSIS — I10 Essential (primary) hypertension: Secondary | ICD-10-CM | POA: Diagnosis not present

## 2020-03-20 DIAGNOSIS — R296 Repeated falls: Secondary | ICD-10-CM | POA: Diagnosis not present

## 2020-03-20 DIAGNOSIS — F418 Other specified anxiety disorders: Secondary | ICD-10-CM | POA: Diagnosis not present

## 2020-03-20 DIAGNOSIS — U071 COVID-19: Secondary | ICD-10-CM | POA: Diagnosis not present

## 2020-03-21 DIAGNOSIS — F418 Other specified anxiety disorders: Secondary | ICD-10-CM | POA: Diagnosis not present

## 2020-03-21 DIAGNOSIS — M810 Age-related osteoporosis without current pathological fracture: Secondary | ICD-10-CM | POA: Diagnosis not present

## 2020-03-21 DIAGNOSIS — M170 Bilateral primary osteoarthritis of knee: Secondary | ICD-10-CM | POA: Diagnosis not present

## 2020-03-21 DIAGNOSIS — R296 Repeated falls: Secondary | ICD-10-CM | POA: Diagnosis not present

## 2020-03-21 DIAGNOSIS — I10 Essential (primary) hypertension: Secondary | ICD-10-CM | POA: Diagnosis not present

## 2020-03-21 DIAGNOSIS — U071 COVID-19: Secondary | ICD-10-CM | POA: Diagnosis not present

## 2020-03-28 DIAGNOSIS — M7061 Trochanteric bursitis, right hip: Secondary | ICD-10-CM | POA: Diagnosis not present

## 2020-03-29 DIAGNOSIS — M170 Bilateral primary osteoarthritis of knee: Secondary | ICD-10-CM | POA: Diagnosis not present

## 2020-03-29 DIAGNOSIS — R296 Repeated falls: Secondary | ICD-10-CM | POA: Diagnosis not present

## 2020-03-29 DIAGNOSIS — M810 Age-related osteoporosis without current pathological fracture: Secondary | ICD-10-CM | POA: Diagnosis not present

## 2020-03-29 DIAGNOSIS — F418 Other specified anxiety disorders: Secondary | ICD-10-CM | POA: Diagnosis not present

## 2020-03-29 DIAGNOSIS — I10 Essential (primary) hypertension: Secondary | ICD-10-CM | POA: Diagnosis not present

## 2020-03-29 DIAGNOSIS — U071 COVID-19: Secondary | ICD-10-CM | POA: Diagnosis not present

## 2020-03-31 DIAGNOSIS — U071 COVID-19: Secondary | ICD-10-CM | POA: Diagnosis not present

## 2020-03-31 DIAGNOSIS — M170 Bilateral primary osteoarthritis of knee: Secondary | ICD-10-CM | POA: Diagnosis not present

## 2020-03-31 DIAGNOSIS — I10 Essential (primary) hypertension: Secondary | ICD-10-CM | POA: Diagnosis not present

## 2020-03-31 DIAGNOSIS — F418 Other specified anxiety disorders: Secondary | ICD-10-CM | POA: Diagnosis not present

## 2020-03-31 DIAGNOSIS — R296 Repeated falls: Secondary | ICD-10-CM | POA: Diagnosis not present

## 2020-03-31 DIAGNOSIS — M810 Age-related osteoporosis without current pathological fracture: Secondary | ICD-10-CM | POA: Diagnosis not present

## 2020-04-03 DIAGNOSIS — I10 Essential (primary) hypertension: Secondary | ICD-10-CM | POA: Diagnosis not present

## 2020-04-03 DIAGNOSIS — F418 Other specified anxiety disorders: Secondary | ICD-10-CM | POA: Diagnosis not present

## 2020-04-03 DIAGNOSIS — M170 Bilateral primary osteoarthritis of knee: Secondary | ICD-10-CM | POA: Diagnosis not present

## 2020-04-03 DIAGNOSIS — R296 Repeated falls: Secondary | ICD-10-CM | POA: Diagnosis not present

## 2020-04-03 DIAGNOSIS — M810 Age-related osteoporosis without current pathological fracture: Secondary | ICD-10-CM | POA: Diagnosis not present

## 2020-04-03 DIAGNOSIS — U071 COVID-19: Secondary | ICD-10-CM | POA: Diagnosis not present

## 2020-04-04 DIAGNOSIS — F418 Other specified anxiety disorders: Secondary | ICD-10-CM | POA: Diagnosis not present

## 2020-04-04 DIAGNOSIS — I10 Essential (primary) hypertension: Secondary | ICD-10-CM | POA: Diagnosis not present

## 2020-04-04 DIAGNOSIS — M170 Bilateral primary osteoarthritis of knee: Secondary | ICD-10-CM | POA: Diagnosis not present

## 2020-04-04 DIAGNOSIS — R296 Repeated falls: Secondary | ICD-10-CM | POA: Diagnosis not present

## 2020-04-04 DIAGNOSIS — M810 Age-related osteoporosis without current pathological fracture: Secondary | ICD-10-CM | POA: Diagnosis not present

## 2020-04-04 DIAGNOSIS — U071 COVID-19: Secondary | ICD-10-CM | POA: Diagnosis not present

## 2020-04-06 DIAGNOSIS — E039 Hypothyroidism, unspecified: Secondary | ICD-10-CM | POA: Diagnosis not present

## 2020-04-06 DIAGNOSIS — F418 Other specified anxiety disorders: Secondary | ICD-10-CM | POA: Diagnosis not present

## 2020-04-06 DIAGNOSIS — Z9181 History of falling: Secondary | ICD-10-CM | POA: Diagnosis not present

## 2020-04-06 DIAGNOSIS — Z96641 Presence of right artificial hip joint: Secondary | ICD-10-CM | POA: Diagnosis not present

## 2020-04-06 DIAGNOSIS — Z87891 Personal history of nicotine dependence: Secondary | ICD-10-CM | POA: Diagnosis not present

## 2020-04-06 DIAGNOSIS — R296 Repeated falls: Secondary | ICD-10-CM | POA: Diagnosis not present

## 2020-04-06 DIAGNOSIS — J45909 Unspecified asthma, uncomplicated: Secondary | ICD-10-CM | POA: Diagnosis not present

## 2020-04-06 DIAGNOSIS — M170 Bilateral primary osteoarthritis of knee: Secondary | ICD-10-CM | POA: Diagnosis not present

## 2020-04-06 DIAGNOSIS — Z79899 Other long term (current) drug therapy: Secondary | ICD-10-CM | POA: Diagnosis not present

## 2020-04-06 DIAGNOSIS — I10 Essential (primary) hypertension: Secondary | ICD-10-CM | POA: Diagnosis not present

## 2020-04-06 DIAGNOSIS — U071 COVID-19: Secondary | ICD-10-CM | POA: Diagnosis not present

## 2020-04-06 DIAGNOSIS — E785 Hyperlipidemia, unspecified: Secondary | ICD-10-CM | POA: Diagnosis not present

## 2020-04-06 DIAGNOSIS — M810 Age-related osteoporosis without current pathological fracture: Secondary | ICD-10-CM | POA: Diagnosis not present

## 2020-04-10 DIAGNOSIS — F900 Attention-deficit hyperactivity disorder, predominantly inattentive type: Secondary | ICD-10-CM | POA: Diagnosis not present

## 2020-04-10 DIAGNOSIS — F3175 Bipolar disorder, in partial remission, most recent episode depressed: Secondary | ICD-10-CM | POA: Diagnosis not present

## 2020-04-14 DIAGNOSIS — F418 Other specified anxiety disorders: Secondary | ICD-10-CM | POA: Diagnosis not present

## 2020-04-14 DIAGNOSIS — M810 Age-related osteoporosis without current pathological fracture: Secondary | ICD-10-CM | POA: Diagnosis not present

## 2020-04-14 DIAGNOSIS — M170 Bilateral primary osteoarthritis of knee: Secondary | ICD-10-CM | POA: Diagnosis not present

## 2020-04-14 DIAGNOSIS — U071 COVID-19: Secondary | ICD-10-CM | POA: Diagnosis not present

## 2020-04-14 DIAGNOSIS — R296 Repeated falls: Secondary | ICD-10-CM | POA: Diagnosis not present

## 2020-04-14 DIAGNOSIS — I10 Essential (primary) hypertension: Secondary | ICD-10-CM | POA: Diagnosis not present

## 2020-04-20 DIAGNOSIS — I10 Essential (primary) hypertension: Secondary | ICD-10-CM | POA: Diagnosis not present

## 2020-04-20 DIAGNOSIS — M810 Age-related osteoporosis without current pathological fracture: Secondary | ICD-10-CM | POA: Diagnosis not present

## 2020-04-20 DIAGNOSIS — F418 Other specified anxiety disorders: Secondary | ICD-10-CM | POA: Diagnosis not present

## 2020-04-20 DIAGNOSIS — M170 Bilateral primary osteoarthritis of knee: Secondary | ICD-10-CM | POA: Diagnosis not present

## 2020-04-20 DIAGNOSIS — R296 Repeated falls: Secondary | ICD-10-CM | POA: Diagnosis not present

## 2020-04-20 DIAGNOSIS — U071 COVID-19: Secondary | ICD-10-CM | POA: Diagnosis not present

## 2020-04-25 DIAGNOSIS — M170 Bilateral primary osteoarthritis of knee: Secondary | ICD-10-CM | POA: Diagnosis not present

## 2020-04-25 DIAGNOSIS — M810 Age-related osteoporosis without current pathological fracture: Secondary | ICD-10-CM | POA: Diagnosis not present

## 2020-04-25 DIAGNOSIS — I10 Essential (primary) hypertension: Secondary | ICD-10-CM | POA: Diagnosis not present

## 2020-04-25 DIAGNOSIS — R296 Repeated falls: Secondary | ICD-10-CM | POA: Diagnosis not present

## 2020-04-25 DIAGNOSIS — U071 COVID-19: Secondary | ICD-10-CM | POA: Diagnosis not present

## 2020-04-25 DIAGNOSIS — F418 Other specified anxiety disorders: Secondary | ICD-10-CM | POA: Diagnosis not present

## 2020-05-09 DIAGNOSIS — D692 Other nonthrombocytopenic purpura: Secondary | ICD-10-CM | POA: Diagnosis not present

## 2020-05-09 DIAGNOSIS — L821 Other seborrheic keratosis: Secondary | ICD-10-CM | POA: Diagnosis not present

## 2020-05-09 DIAGNOSIS — L570 Actinic keratosis: Secondary | ICD-10-CM | POA: Diagnosis not present

## 2020-05-10 DIAGNOSIS — K5190 Ulcerative colitis, unspecified, without complications: Secondary | ICD-10-CM | POA: Diagnosis not present

## 2020-05-15 DIAGNOSIS — Z96641 Presence of right artificial hip joint: Secondary | ICD-10-CM | POA: Diagnosis not present

## 2020-05-15 DIAGNOSIS — M25551 Pain in right hip: Secondary | ICD-10-CM | POA: Diagnosis not present

## 2020-05-24 DIAGNOSIS — R5383 Other fatigue: Secondary | ICD-10-CM | POA: Diagnosis not present

## 2020-05-24 DIAGNOSIS — J029 Acute pharyngitis, unspecified: Secondary | ICD-10-CM | POA: Diagnosis not present

## 2020-05-24 DIAGNOSIS — D649 Anemia, unspecified: Secondary | ICD-10-CM | POA: Diagnosis not present

## 2020-05-24 DIAGNOSIS — J45909 Unspecified asthma, uncomplicated: Secondary | ICD-10-CM | POA: Diagnosis not present

## 2020-05-24 DIAGNOSIS — K5190 Ulcerative colitis, unspecified, without complications: Secondary | ICD-10-CM | POA: Diagnosis not present

## 2020-05-24 DIAGNOSIS — B370 Candidal stomatitis: Secondary | ICD-10-CM | POA: Diagnosis not present

## 2020-05-26 DIAGNOSIS — M25551 Pain in right hip: Secondary | ICD-10-CM | POA: Diagnosis not present

## 2020-05-30 DIAGNOSIS — M7061 Trochanteric bursitis, right hip: Secondary | ICD-10-CM | POA: Diagnosis not present

## 2020-06-01 DIAGNOSIS — M25551 Pain in right hip: Secondary | ICD-10-CM | POA: Diagnosis not present

## 2020-06-05 DIAGNOSIS — M25551 Pain in right hip: Secondary | ICD-10-CM | POA: Diagnosis not present

## 2020-06-07 DIAGNOSIS — M25551 Pain in right hip: Secondary | ICD-10-CM | POA: Diagnosis not present

## 2020-06-12 DIAGNOSIS — M25551 Pain in right hip: Secondary | ICD-10-CM | POA: Diagnosis not present

## 2020-06-14 DIAGNOSIS — M25551 Pain in right hip: Secondary | ICD-10-CM | POA: Diagnosis not present

## 2020-06-20 DIAGNOSIS — Z23 Encounter for immunization: Secondary | ICD-10-CM | POA: Diagnosis not present

## 2020-06-21 DIAGNOSIS — M25551 Pain in right hip: Secondary | ICD-10-CM | POA: Diagnosis not present

## 2020-07-04 DIAGNOSIS — D849 Immunodeficiency, unspecified: Secondary | ICD-10-CM | POA: Diagnosis not present

## 2020-07-04 DIAGNOSIS — K5190 Ulcerative colitis, unspecified, without complications: Secondary | ICD-10-CM | POA: Diagnosis not present

## 2020-07-04 DIAGNOSIS — H1033 Unspecified acute conjunctivitis, bilateral: Secondary | ICD-10-CM | POA: Diagnosis not present

## 2020-07-04 DIAGNOSIS — R059 Cough, unspecified: Secondary | ICD-10-CM | POA: Diagnosis not present

## 2020-07-04 DIAGNOSIS — J189 Pneumonia, unspecified organism: Secondary | ICD-10-CM | POA: Diagnosis not present

## 2020-07-04 DIAGNOSIS — Z1152 Encounter for screening for COVID-19: Secondary | ICD-10-CM | POA: Diagnosis not present

## 2020-07-06 DIAGNOSIS — R059 Cough, unspecified: Secondary | ICD-10-CM | POA: Diagnosis not present

## 2020-07-06 DIAGNOSIS — K5190 Ulcerative colitis, unspecified, without complications: Secondary | ICD-10-CM | POA: Diagnosis not present

## 2020-07-06 DIAGNOSIS — H1033 Unspecified acute conjunctivitis, bilateral: Secondary | ICD-10-CM | POA: Diagnosis not present

## 2020-07-06 DIAGNOSIS — J189 Pneumonia, unspecified organism: Secondary | ICD-10-CM | POA: Diagnosis not present

## 2020-07-06 DIAGNOSIS — D849 Immunodeficiency, unspecified: Secondary | ICD-10-CM | POA: Diagnosis not present

## 2020-07-11 DIAGNOSIS — M25551 Pain in right hip: Secondary | ICD-10-CM | POA: Diagnosis not present

## 2020-07-13 DIAGNOSIS — F3175 Bipolar disorder, in partial remission, most recent episode depressed: Secondary | ICD-10-CM | POA: Diagnosis not present

## 2020-07-13 DIAGNOSIS — F900 Attention-deficit hyperactivity disorder, predominantly inattentive type: Secondary | ICD-10-CM | POA: Diagnosis not present

## 2020-07-13 DIAGNOSIS — M25551 Pain in right hip: Secondary | ICD-10-CM | POA: Diagnosis not present

## 2020-07-14 DIAGNOSIS — Z96641 Presence of right artificial hip joint: Secondary | ICD-10-CM | POA: Diagnosis not present

## 2020-07-18 DIAGNOSIS — M25551 Pain in right hip: Secondary | ICD-10-CM | POA: Diagnosis not present

## 2020-07-28 DIAGNOSIS — M7061 Trochanteric bursitis, right hip: Secondary | ICD-10-CM | POA: Diagnosis not present

## 2020-08-10 DIAGNOSIS — M25551 Pain in right hip: Secondary | ICD-10-CM | POA: Diagnosis not present

## 2020-08-18 DIAGNOSIS — M25551 Pain in right hip: Secondary | ICD-10-CM | POA: Diagnosis not present

## 2020-08-18 DIAGNOSIS — M170 Bilateral primary osteoarthritis of knee: Secondary | ICD-10-CM | POA: Diagnosis not present

## 2020-08-29 DIAGNOSIS — M25551 Pain in right hip: Secondary | ICD-10-CM | POA: Diagnosis not present

## 2020-09-01 DIAGNOSIS — M25551 Pain in right hip: Secondary | ICD-10-CM | POA: Diagnosis not present

## 2020-09-05 DIAGNOSIS — M25551 Pain in right hip: Secondary | ICD-10-CM | POA: Diagnosis not present

## 2020-09-08 DIAGNOSIS — Z23 Encounter for immunization: Secondary | ICD-10-CM | POA: Diagnosis not present

## 2020-09-08 DIAGNOSIS — W228XXA Striking against or struck by other objects, initial encounter: Secondary | ICD-10-CM | POA: Diagnosis not present

## 2020-09-08 DIAGNOSIS — S71111A Laceration without foreign body, right thigh, initial encounter: Secondary | ICD-10-CM | POA: Diagnosis not present

## 2020-09-12 DIAGNOSIS — M25551 Pain in right hip: Secondary | ICD-10-CM | POA: Diagnosis not present

## 2020-09-14 DIAGNOSIS — M25551 Pain in right hip: Secondary | ICD-10-CM | POA: Diagnosis not present

## 2020-09-15 ENCOUNTER — Other Ambulatory Visit (HOSPITAL_COMMUNITY): Payer: Self-pay | Admitting: Registered Nurse

## 2020-09-15 ENCOUNTER — Other Ambulatory Visit: Payer: Self-pay | Admitting: Registered Nurse

## 2020-09-15 DIAGNOSIS — R413 Other amnesia: Secondary | ICD-10-CM | POA: Diagnosis not present

## 2020-09-15 DIAGNOSIS — I10 Essential (primary) hypertension: Secondary | ICD-10-CM | POA: Diagnosis not present

## 2020-09-15 DIAGNOSIS — R269 Unspecified abnormalities of gait and mobility: Secondary | ICD-10-CM

## 2020-09-15 DIAGNOSIS — R296 Repeated falls: Secondary | ICD-10-CM | POA: Diagnosis not present

## 2020-09-15 DIAGNOSIS — E785 Hyperlipidemia, unspecified: Secondary | ICD-10-CM | POA: Diagnosis not present

## 2020-09-16 ENCOUNTER — Ambulatory Visit (HOSPITAL_COMMUNITY)
Admission: RE | Admit: 2020-09-16 | Discharge: 2020-09-16 | Disposition: A | Discharge status: Home / Self Care | Payer: Medicare Other | Source: Ambulatory Visit | Attending: Registered Nurse | Admitting: Registered Nurse

## 2020-09-16 ENCOUNTER — Other Ambulatory Visit: Payer: Self-pay

## 2020-09-16 DIAGNOSIS — R413 Other amnesia: Secondary | ICD-10-CM | POA: Diagnosis not present

## 2020-09-16 DIAGNOSIS — R269 Unspecified abnormalities of gait and mobility: Secondary | ICD-10-CM

## 2020-09-16 DIAGNOSIS — R42 Dizziness and giddiness: Secondary | ICD-10-CM | POA: Diagnosis not present

## 2020-09-16 DIAGNOSIS — R296 Repeated falls: Secondary | ICD-10-CM

## 2020-09-16 MED ORDER — GADOBUTROL 1 MMOL/ML IV SOLN
7.0000 mL | Freq: Once | INTRAVENOUS | Status: AC | PRN
  Administered 2020-09-16: 7 mL via INTRAVENOUS

## 2020-09-18 DIAGNOSIS — D649 Anemia, unspecified: Secondary | ICD-10-CM | POA: Diagnosis not present

## 2020-09-18 DIAGNOSIS — G3184 Mild cognitive impairment, so stated: Secondary | ICD-10-CM | POA: Diagnosis not present

## 2020-09-20 DIAGNOSIS — Z4802 Encounter for removal of sutures: Secondary | ICD-10-CM | POA: Diagnosis not present

## 2020-09-20 DIAGNOSIS — S71111A Laceration without foreign body, right thigh, initial encounter: Secondary | ICD-10-CM | POA: Diagnosis not present

## 2020-09-21 DIAGNOSIS — M25551 Pain in right hip: Secondary | ICD-10-CM | POA: Diagnosis not present

## 2020-09-22 DIAGNOSIS — A530 Latent syphilis, unspecified as early or late: Secondary | ICD-10-CM | POA: Diagnosis not present

## 2020-09-27 DIAGNOSIS — M25551 Pain in right hip: Secondary | ICD-10-CM | POA: Diagnosis not present

## 2020-09-29 DIAGNOSIS — R269 Unspecified abnormalities of gait and mobility: Secondary | ICD-10-CM | POA: Diagnosis not present

## 2020-09-29 DIAGNOSIS — M25551 Pain in right hip: Secondary | ICD-10-CM | POA: Diagnosis not present

## 2020-09-29 DIAGNOSIS — R296 Repeated falls: Secondary | ICD-10-CM | POA: Diagnosis not present

## 2020-09-29 DIAGNOSIS — I10 Essential (primary) hypertension: Secondary | ICD-10-CM | POA: Diagnosis not present

## 2020-09-29 DIAGNOSIS — R413 Other amnesia: Secondary | ICD-10-CM | POA: Diagnosis not present

## 2020-09-29 DIAGNOSIS — E785 Hyperlipidemia, unspecified: Secondary | ICD-10-CM | POA: Diagnosis not present

## 2020-10-03 DIAGNOSIS — M25551 Pain in right hip: Secondary | ICD-10-CM | POA: Diagnosis not present

## 2020-10-04 ENCOUNTER — Encounter: Payer: Self-pay | Admitting: *Deleted

## 2020-10-06 DIAGNOSIS — M25551 Pain in right hip: Secondary | ICD-10-CM | POA: Diagnosis not present

## 2020-10-09 ENCOUNTER — Ambulatory Visit: Payer: Medicare Other | Admitting: Diagnostic Neuroimaging

## 2020-10-10 DIAGNOSIS — M25551 Pain in right hip: Secondary | ICD-10-CM | POA: Diagnosis not present

## 2020-10-11 DIAGNOSIS — E785 Hyperlipidemia, unspecified: Secondary | ICD-10-CM | POA: Diagnosis not present

## 2020-10-11 DIAGNOSIS — E039 Hypothyroidism, unspecified: Secondary | ICD-10-CM | POA: Diagnosis not present

## 2020-10-12 DIAGNOSIS — M25551 Pain in right hip: Secondary | ICD-10-CM | POA: Diagnosis not present

## 2020-10-16 ENCOUNTER — Ambulatory Visit (INDEPENDENT_AMBULATORY_CARE_PROVIDER_SITE_OTHER): Payer: Medicare Other

## 2020-10-16 ENCOUNTER — Other Ambulatory Visit: Payer: Self-pay

## 2020-10-16 ENCOUNTER — Encounter: Payer: Self-pay | Admitting: Pulmonary Disease

## 2020-10-16 ENCOUNTER — Ambulatory Visit (INDEPENDENT_AMBULATORY_CARE_PROVIDER_SITE_OTHER): Payer: Medicare Other | Admitting: Pulmonary Disease

## 2020-10-16 VITALS — BP 130/68 | HR 77 | Temp 97.4°F | Ht 65.0 in

## 2020-10-16 DIAGNOSIS — K5190 Ulcerative colitis, unspecified, without complications: Secondary | ICD-10-CM | POA: Diagnosis not present

## 2020-10-16 DIAGNOSIS — R062 Wheezing: Secondary | ICD-10-CM | POA: Diagnosis not present

## 2020-10-16 DIAGNOSIS — R0602 Shortness of breath: Secondary | ICD-10-CM | POA: Diagnosis not present

## 2020-10-16 LAB — CBC WITH DIFFERENTIAL/PLATELET
Basophils Absolute: 0.1 10*3/uL (ref 0.0–0.1)
Basophils Relative: 1 % (ref 0.0–3.0)
Eosinophils Absolute: 0.4 10*3/uL (ref 0.0–0.7)
Eosinophils Relative: 6.2 % — ABNORMAL HIGH (ref 0.0–5.0)
HCT: 36.5 % (ref 36.0–46.0)
Hemoglobin: 11.9 g/dL — ABNORMAL LOW (ref 12.0–15.0)
Lymphocytes Relative: 16.9 % (ref 12.0–46.0)
Lymphs Abs: 1.2 10*3/uL (ref 0.7–4.0)
MCHC: 32.7 g/dL (ref 30.0–36.0)
MCV: 90.7 fl (ref 78.0–100.0)
Monocytes Absolute: 0.8 10*3/uL (ref 0.1–1.0)
Monocytes Relative: 10.5 % (ref 3.0–12.0)
Neutro Abs: 4.7 10*3/uL (ref 1.4–7.7)
Neutrophils Relative %: 65.4 % (ref 43.0–77.0)
Platelets: 265 10*3/uL (ref 150.0–400.0)
RBC: 4.03 Mil/uL (ref 3.87–5.11)
RDW: 15.1 % (ref 11.5–15.5)
WBC: 7.2 10*3/uL (ref 4.0–10.5)

## 2020-10-16 MED ORDER — BUDESONIDE-FORMOTEROL FUMARATE 160-4.5 MCG/ACT IN AERO: 2.0000 | INHALATION_SPRAY | Freq: Two times a day (BID) | RESPIRATORY_TRACT | 5 refills | Status: DC

## 2020-10-16 NOTE — Patient Instructions (Signed)
We will schedule you for CBC with differential, IgE Get a chest x-ray today, schedule PFTs  Start Symbicort 160/4.5  Follow-up in 1 to 2 months for review.

## 2020-10-16 NOTE — Progress Notes (Signed)
[name]    [id number]    February 09, 1946  Primary Care Physician:Shaw, Emily Filbert., MD  Referring Physician: Ginger Organ., MD [address] [place],  [place] [address]  Chief complaint: Consult for wheezing  HPI: 75 year old with history of hypertension, asthma, hyperlipidemia, allergies Here for evaluation of wheezing. She has a history of asthma Albuterol which she uses about 1 time per month.  She states that she is doing okay But her 2 daughters who are here today tells me that they hear her breathing all the time.  Denies any cough, sputum production, fevers, chills.  She recently lost her husband to ALS and is adjusting to life on her own.  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  No feather pillows or comforters Smoking history: Never smoker.  Exposed to secondhand smoke Travel history: Originally from [place], no significant recent travel Relevant family history: Mother had COPD.  She was a non-smoker.  Outpatient Encounter Medications as of 10/16/2020  Medication Sig  . ALPRAZolam (XANAX) 0.25 MG tablet Take 0.25 mg by mouth 2 (two) times daily as needed for anxiety or sleep.   [name] buPROPion (WELLBUTRIN XL) 300 MG 24 hr tablet Take 1 tablet (300 mg total) by mouth daily.  . celecoxib (CELEBREX) 200 MG capsule Take 200 mg by mouth daily.  . Cholecalciferol (VITAMIN D PO) Take 1 tablet by mouth daily.  . Cholecalciferol (VITAMIN D3) 100000 UNIT/GM POWD Take 1 tablet by mouth daily.  . CONCERTA 36 MG CR tablet Take 36 mg by mouth daily.   [name] FLUoxetine (PROZAC) 20 MG capsule Take 3 capsules (60 mg total) by mouth daily.  [name] HYDROcodone-acetaminophen (NORCO) 7.5-325 MG tablet Take 1-2 tablets by mouth every 4 (four) hours as needed for moderate pain.  [name] inFLIXimab (REMICADE) 100 MG injection IV infusion every 2 months  . lamoTRIgine (LAMICTAL) 100 MG tablet Take 100  mg by mouth 3 (three) times daily.   . pantoprazole (PROTONIX) 40 MG tablet TAKE ONE TABLET TWICE DAILY (Patient taking differently: Take 40 mg by mouth 2 (two) times daily.)  . rosuvastatin (CRESTOR) 10 MG tablet Take 10 mg by mouth daily.   [name] amLODipine-valsartan (EXFORGE) 5-160 MG tablet Take 1 tablet by mouth daily.  . methocarbamol (ROBAXIN) 500 MG tablet Take 1 tablet (500 mg total) by mouth every 6 (six) hours as needed for muscle spasms. (Patient not taking: Reported on 10/16/2020)  . polyethylene glycol (MIRALAX / GLYCOLAX) packet Take 17 g by mouth 2 (two) times daily. (Patient not taking: Reported on 10/16/2020)  . SYNTHROID 50 MCG tablet TAKE 2 TABLETS EVERY MORNING (Patient taking differently: Take 100 mcg by mouth daily before breakfast. )  . VENTOLIN HFA 108 (90 Base) MCG/ACT inhaler ONE PUFF FOUR TIMES DAILY AS NEEDED (Patient taking differently: Inhale 1 puff into the lungs every 6 (six) hours as needed for wheezing or shortness of breath. )  . [DISCONTINUED] ALPRAZolam (XANAX) 0.5 MG tablet Take 1 tablet (0.5 mg total) by mouth 2 (two) times daily as needed for anxiety or sleep. (Patient not taking: Reported on 04/06/2019)  . [DISCONTINUED] Diclofenac-miSOPROStol 75-0.2 MG TBEC Take 1 tablet by mouth 2 (two) times daily.  . [DISCONTINUED] ferrous sulfate (FERROUSUL) 325 (65 FE) MG tablet Take 1 tablet (325 mg total) by mouth 3 (three) times daily with meals. (Patient not taking: Reported on 04/06/2019)  . [DISCONTINUED]  hydrochlorothiazide (MICROZIDE) 12.5 MG capsule Take 12.5 mg by mouth daily.   . [DISCONTINUED] losartan-hydrochlorothiazide (HYZAAR) 50-12.5 MG per tablet Take 1 tablet by mouth daily.  . [DISCONTINUED] Menthol, Topical Analgesic, (BIOFREEZE) 4 % GEL Apply 1 application topically daily as needed (for pain).   . [DISCONTINUED] simvastatin (ZOCOR) 20 MG tablet Take 20 mg by mouth every evening.   No facility-administered encounter medications on file as of 10/16/2020.     Allergies as of 10/16/2020 - Review Complete 10/16/2020  Allergen Reaction Noted  . Ampicillin Nausea And Vomiting 06/25/2013  . Bee pollen  10/04/2019  . Erythromycin Nausea And Vomiting and Other (See Comments) 09/04/2011  . Hctz [hydrochlorothiazide] Other (See Comments) 05/08/2015  . Metoprolol Other (See Comments) 05/08/2015  . Molds & smuts Other (See Comments) 07/29/2011  . Morphine and related Nausea And Vomiting 08/09/2011  . Penicillin g Other (See Comments) 10/07/2014    Past Medical History:  Diagnosis Date  . ADD (attention deficit disorder)   . Allergy   . Anxiety   . Asthma   . Attention deficit disorder without mention of hyperactivity   . Back pain   . Colitis 12/05/2010  . Colon polyps   . Complication of anesthesia    problem with grogginess and a long time to get over Anesthesia  . Depression   . Diverticulosis   . GERD (gastroesophageal reflux disease)   . H/O hiatal hernia   . Hypertension   . IBS (irritable bowel syndrome)   . MCI (mild cognitive impairment)   . Memory changes   . Osteoarthrosis, unspecified whether generalized or localized, unspecified site   . Other and unspecified hyperlipidemia   . Renal cyst 10/2015   simple left  . Repeated falls   . Thyroid disease   . Ulcerative colitis   . Unspecified asthma(493.90)   . Unspecified essential hypertension    taken off meds since lost wt    Past Surgical History:  Procedure Laterality Date  . ABDOMINAL EXPOSURE N/A 04/22/2014   Procedure: ABDOMINAL EXPOSURE;  Surgeon: Angelia Mould, MD;  Location: [facility];  Service: Vascular;  Laterality: N/A;  . ANTERIOR LATERAL LUMBAR FUSION 4 LEVELS Left 04/22/2014   Procedure: Left Lumbar one/two, two/three, three/four, four,five. Anterior lateral lumbar interbody fusion**Stage 1**;  Surgeon: Erline Levine, MD;  Location: [facility];  Service: Neurosurgery;  Laterality: Left;  . ANTERIOR LUMBAR FUSION N/A 04/22/2014   Procedure:  Lumbar five/-Sacral one. Anterior lumbar interbody fusion with Dr. Scot Dock; Left Lumbar one/two, two/three/three/four, four/five. Anterior lateral lumbar interbody fusion**Stage 1**;  Surgeon: Erline Levine, MD;  Location: [facility];  Service: Neurosurgery;  Laterality: N/A;  . BACK SURGERY     fluid removed from between disks  . BREAST SURGERY     bil breast reduction   . CHOLECYSTECTOMY     Laparoscopic cholecystectomy  . COLONOSCOPY    . HIP FRACTURE SURGERY     left  11/01/2013  . LUMBAR PERCUTANEOUS PEDICLE SCREW 4 LEVEL N/A 04/25/2014   Procedure: **Stage 2** Percutaneous pedicle screw placement L1-5;  Surgeon: Erline Levine, MD;  Location: [facility];  Service: Neurosurgery;  Laterality: N/A;  **Stage 2** Percutaneous pedicle screw placement L1-5  . SHOULDER SURGERY     right shoulder 3x   . TOE SURGERY     right big toe  . TOTAL HIP ARTHROPLASTY Right 07/07/2018   Procedure: TOTAL HIP ARTHROPLASTY ANTERIOR APPROACH;  Surgeon: Paralee Cancel, MD;  Location: [facility];  Service: Orthopedics;  Laterality: Right;  70 mins  . TOTAL SHOULDER ARTHROPLASTY    . UPPER GASTROINTESTINAL ENDOSCOPY  09/28/2016   Dilation    Family History  Problem Relation Age of Onset  . Hypertension Father   . Colon cancer Father 89  . Arthritis Father   . Heart disease Father        CHF  . Heart disease Mother        Died, 80  . Healthy Brother   . Healthy Daughter   . Diabetes Neg Hx   . Breast cancer Neg Hx     Social History   Socioeconomic History  . Marital status: Married    Spouse name: Not on file  . Number of children: 3  . Years of education: 72  . Highest education level: Not on file  Occupational History  . Occupation: Psychologist, counselling: SELF EMPLOYED    Comment: retired  Tobacco Use  . Smoking status: Former Smoker    Quit date: 10/30/1978    Years since quitting: 41.9  . Smokeless tobacco: Never Used  Vaping Use  . Vaping Use: Never used  Substance and  Sexual Activity  . Alcohol use: Yes    Alcohol/week: 1.0 standard drink    Types: 1 Standard drinks or equivalent per week    Comment: once a week  . Drug use: No  . Sexual activity: Yes  Other Topics Concern  . Not on file  Social History Narrative   [employer] [employer]; post-graduate study [employer].  married '68.  3 children: 2 daughters - '70, '72; 1 son '82; 6 grand-children. Lost her mother 2008, elderly father lives alone-in [place] died in September 14, 2022. owner/operator interior Agricultural consultant business, semi-retired. Lives with husband. Marriage is a work in progress      Caffeine - coffee 1 cup daily   Social Determinants of Radio broadcast assistant Strain: Not on file  Food Insecurity: Not on file  Transportation Needs: Not on file  Physical Activity: Not on file  Stress: Not on file  Social Connections: Not on file  Intimate Partner Violence: Not on file    Review of systems: Review of Systems  Constitutional: Negative for fever and chills.  HENT: Negative.   Eyes: Negative for blurred vision.  Respiratory: as per HPI  Cardiovascular: Negative for chest pain and palpitations.  Gastrointestinal: Negative for vomiting, diarrhea, blood per rectum. Genitourinary: Negative for dysuria, urgency, frequency and hematuria.  Musculoskeletal: Negative for myalgias, back pain and joint pain.  Skin: Negative for itching and rash.  Neurological: Negative for dizziness, tremors, focal weakness, seizures and loss of consciousness.  Endo/Heme/Allergies: Negative for environmental allergies.  Psychiatric/Behavioral: Negative for depression, suicidal ideas and hallucinations.  All other systems reviewed and are negative.  Physical Exam: Blood pressure 130/68, pulse 77, temperature (!) 97.4 F (36.3 C), temperature source Temporal, height 5' 5"  (1.651 m), SpO2 99 %. Gen:      No acute distress HEENT:  EOMI, sclera anicteric Neck:     No masses; no thyromegaly Lungs:    Clear to auscultation  bilaterally; normal respiratory effort CV:         Regular rate and rhythm; no murmurs Abd:      + bowel sounds; soft, non-tender; no palpable masses, no distension Ext:    No edema; adequate peripheral perfusion Skin:      Warm and dry; no rash Neuro: alert and oriented x 3 Psych: normal mood  and affect  Data Reviewed: Imaging:   PFTs:  Labs:  Assessment:  Asthma Check CBC differential, IgE Chest x-ray today, schedule PFTs  Given symptoms of persistent wheeze will start on controller medication with Symbicort 160 Return to clinic in 1 to 2 months.  Plan/Recommendations: Labs, chest x-ray, PFTs Symbicort  Marshell Garfinkel MD  Pulmonary and Critical Care 10/16/2020, 10:28 AM  CC: Ginger Organ., MD

## 2020-10-17 LAB — IGE: IgE (Immunoglobulin E), Serum: 2 kU/L (ref <=114)

## 2020-10-18 DIAGNOSIS — I10 Essential (primary) hypertension: Secondary | ICD-10-CM | POA: Diagnosis not present

## 2020-10-18 DIAGNOSIS — M810 Age-related osteoporosis without current pathological fracture: Secondary | ICD-10-CM | POA: Diagnosis not present

## 2020-10-18 DIAGNOSIS — Z1331 Encounter for screening for depression: Secondary | ICD-10-CM | POA: Diagnosis not present

## 2020-10-18 DIAGNOSIS — I700 Atherosclerosis of aorta: Secondary | ICD-10-CM | POA: Diagnosis not present

## 2020-10-18 DIAGNOSIS — J45909 Unspecified asthma, uncomplicated: Secondary | ICD-10-CM | POA: Diagnosis not present

## 2020-10-18 DIAGNOSIS — E785 Hyperlipidemia, unspecified: Secondary | ICD-10-CM | POA: Diagnosis not present

## 2020-10-18 DIAGNOSIS — K5190 Ulcerative colitis, unspecified, without complications: Secondary | ICD-10-CM | POA: Diagnosis not present

## 2020-10-18 DIAGNOSIS — F3342 Major depressive disorder, recurrent, in full remission: Secondary | ICD-10-CM | POA: Diagnosis not present

## 2020-10-18 DIAGNOSIS — Z1339 Encounter for screening examination for other mental health and behavioral disorders: Secondary | ICD-10-CM | POA: Diagnosis not present

## 2020-10-18 DIAGNOSIS — R2681 Unsteadiness on feet: Secondary | ICD-10-CM | POA: Diagnosis not present

## 2020-10-18 DIAGNOSIS — Z0000 Encounter for general adult medical examination without abnormal findings: Secondary | ICD-10-CM | POA: Diagnosis not present

## 2020-10-18 DIAGNOSIS — F411 Generalized anxiety disorder: Secondary | ICD-10-CM | POA: Diagnosis not present

## 2020-10-18 DIAGNOSIS — D849 Immunodeficiency, unspecified: Secondary | ICD-10-CM | POA: Diagnosis not present

## 2020-10-18 DIAGNOSIS — G3184 Mild cognitive impairment, so stated: Secondary | ICD-10-CM | POA: Diagnosis not present

## 2020-10-19 DIAGNOSIS — M25551 Pain in right hip: Secondary | ICD-10-CM | POA: Diagnosis not present

## 2020-10-20 ENCOUNTER — Other Ambulatory Visit (HOSPITAL_COMMUNITY)
Admission: RE | Admit: 2020-10-20 | Discharge: 2020-10-20 | Disposition: A | Discharge status: Home / Self Care | Payer: Medicare Other | Source: Ambulatory Visit | Attending: Pulmonary Disease | Admitting: Pulmonary Disease

## 2020-10-20 DIAGNOSIS — Z01812 Encounter for preprocedural laboratory examination: Secondary | ICD-10-CM | POA: Insufficient documentation

## 2020-10-20 DIAGNOSIS — Z20822 Contact with and (suspected) exposure to covid-19: Secondary | ICD-10-CM | POA: Diagnosis not present

## 2020-10-21 LAB — SARS CORONAVIRUS 2 (TAT 6-24 HRS): SARS Coronavirus 2: NEGATIVE

## 2020-10-24 ENCOUNTER — Other Ambulatory Visit: Payer: Self-pay

## 2020-10-24 ENCOUNTER — Ambulatory Visit (INDEPENDENT_AMBULATORY_CARE_PROVIDER_SITE_OTHER): Payer: Medicare Other | Admitting: Pulmonary Disease

## 2020-10-24 DIAGNOSIS — R062 Wheezing: Secondary | ICD-10-CM

## 2020-10-24 LAB — PULMONARY FUNCTION TEST
DL/VA % pred: 97 %
DL/VA: 3.96 ml/min/mmHg/L
DLCO cor % pred: 97 %
DLCO cor: 19.29 ml/min/mmHg
DLCO unc % pred: 92 %
DLCO unc: 18.34 ml/min/mmHg
FEF 25-75 Post: 2.22 L/sec
FEF 25-75 Pre: 1.54 L/sec
FEF2575-%Change-Post: 43 %
FEF2575-%Pred-Post: 125 %
FEF2575-%Pred-Pre: 87 %
FEV1-%Change-Post: 8 %
FEV1-%Pred-Post: 101 %
FEV1-%Pred-Pre: 92 %
FEV1-Post: 2.26 L
FEV1-Pre: 2.08 L
FEV1FVC-%Change-Post: -2 %
FEV1FVC-%Pred-Pre: 98 %
FEV6-%Change-Post: 10 %
FEV6-%Pred-Post: 109 %
FEV6-%Pred-Pre: 99 %
FEV6-Post: 3.11 L
FEV6-Pre: 2.81 L
FEV6FVC-%Change-Post: 0 %
FEV6FVC-%Pred-Post: 104 %
FEV6FVC-%Pred-Pre: 104 %
FVC-%Change-Post: 11 %
FVC-%Pred-Post: 105 %
FVC-%Pred-Pre: 94 %
FVC-Post: 3.14 L
FVC-Pre: 2.81 L
Post FEV1/FVC ratio: 72 %
Post FEV6/FVC ratio: 99 %
Pre FEV1/FVC ratio: 74 %
Pre FEV6/FVC Ratio: 100 %
RV % pred: 108 %
RV: 2.52 L
TLC % pred: 108 %
TLC: 5.63 L

## 2020-10-24 NOTE — Progress Notes (Signed)
Full PFT performed today. °

## 2020-10-24 NOTE — Patient Instructions (Signed)
Full PFT performed today. °

## 2020-10-30 ENCOUNTER — Encounter: Payer: Self-pay | Admitting: *Deleted

## 2020-11-01 ENCOUNTER — Other Ambulatory Visit (HOSPITAL_COMMUNITY): Payer: Self-pay | Admitting: *Deleted

## 2020-11-02 ENCOUNTER — Inpatient Hospital Stay (HOSPITAL_COMMUNITY): Admission: RE | Admit: 2020-11-02 | Payer: Medicare Other | Source: Ambulatory Visit

## 2020-11-16 DIAGNOSIS — M25512 Pain in left shoulder: Secondary | ICD-10-CM | POA: Diagnosis not present

## 2020-11-23 DIAGNOSIS — M25512 Pain in left shoulder: Secondary | ICD-10-CM | POA: Diagnosis not present

## 2020-12-04 ENCOUNTER — Ambulatory Visit: Payer: Medicare Other | Admitting: Pulmonary Disease

## 2020-12-04 DIAGNOSIS — F900 Attention-deficit hyperactivity disorder, predominantly inattentive type: Secondary | ICD-10-CM | POA: Diagnosis not present

## 2020-12-04 DIAGNOSIS — F3175 Bipolar disorder, in partial remission, most recent episode depressed: Secondary | ICD-10-CM | POA: Diagnosis not present

## 2020-12-11 DIAGNOSIS — H532 Diplopia: Secondary | ICD-10-CM | POA: Diagnosis not present

## 2020-12-11 DIAGNOSIS — R296 Repeated falls: Secondary | ICD-10-CM | POA: Diagnosis not present

## 2020-12-11 DIAGNOSIS — R413 Other amnesia: Secondary | ICD-10-CM | POA: Diagnosis not present

## 2020-12-11 DIAGNOSIS — G3184 Mild cognitive impairment, so stated: Secondary | ICD-10-CM | POA: Diagnosis not present

## 2020-12-11 DIAGNOSIS — I10 Essential (primary) hypertension: Secondary | ICD-10-CM | POA: Diagnosis not present

## 2020-12-11 DIAGNOSIS — R270 Ataxia, unspecified: Secondary | ICD-10-CM | POA: Diagnosis not present

## 2020-12-11 DIAGNOSIS — R2681 Unsteadiness on feet: Secondary | ICD-10-CM | POA: Diagnosis not present

## 2020-12-15 DIAGNOSIS — H40013 Open angle with borderline findings, low risk, bilateral: Secondary | ICD-10-CM | POA: Diagnosis not present

## 2020-12-20 ENCOUNTER — Encounter: Payer: Self-pay | Admitting: Diagnostic Neuroimaging

## 2020-12-20 ENCOUNTER — Ambulatory Visit (INDEPENDENT_AMBULATORY_CARE_PROVIDER_SITE_OTHER): Payer: Medicare Other | Admitting: Diagnostic Neuroimaging

## 2020-12-20 ENCOUNTER — Other Ambulatory Visit: Payer: Self-pay

## 2020-12-20 ENCOUNTER — Ambulatory Visit: Payer: Medicare Other | Admitting: Diagnostic Neuroimaging

## 2020-12-20 VITALS — BP 138/78 | HR 64 | Ht 65.0 in | Wt 162.4 lb

## 2020-12-20 DIAGNOSIS — R269 Unspecified abnormalities of gait and mobility: Secondary | ICD-10-CM

## 2020-12-20 DIAGNOSIS — M5431 Sciatica, right side: Secondary | ICD-10-CM

## 2020-12-20 DIAGNOSIS — R413 Other amnesia: Secondary | ICD-10-CM

## 2020-12-20 DIAGNOSIS — M5416 Radiculopathy, lumbar region: Secondary | ICD-10-CM
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
## 2020-12-21 ENCOUNTER — Telehealth: Payer: Self-pay | Admitting: Diagnostic Neuroimaging

## 2020-12-21 NOTE — Telephone Encounter (Signed)
I reached out to [facility] with [facility] to see if they are able to take patient on.  [facility] [address], [place]  [place], [place] [address] Jenetta Downer [place]  [phone]

## 2020-12-26 NOTE — Telephone Encounter (Signed)
Patient will be starting with [facility] tomorrow, 7/6, per Meg with the company.

## 2020-12-28 DIAGNOSIS — K5190 Ulcerative colitis, unspecified, without complications: Secondary | ICD-10-CM | POA: Diagnosis not present

## 2021-01-09 DIAGNOSIS — F3175 Bipolar disorder, in partial remission, most recent episode depressed: Secondary | ICD-10-CM | POA: Diagnosis not present

## 2021-01-09 DIAGNOSIS — F900 Attention-deficit hyperactivity disorder, predominantly inattentive type: Secondary | ICD-10-CM | POA: Diagnosis not present

## 2021-01-09 DIAGNOSIS — F39 Unspecified mood [affective] disorder: Secondary | ICD-10-CM | POA: Diagnosis not present

## 2021-01-09 DIAGNOSIS — H40013 Open angle with borderline findings, low risk, bilateral: Secondary | ICD-10-CM | POA: Diagnosis not present

## 2021-01-09 DIAGNOSIS — H2513 Age-related nuclear cataract, bilateral: Secondary | ICD-10-CM | POA: Diagnosis not present

## 2021-01-10 DIAGNOSIS — W19XXXA Unspecified fall, initial encounter: Secondary | ICD-10-CM | POA: Diagnosis not present

## 2021-01-10 DIAGNOSIS — I10 Essential (primary) hypertension: Secondary | ICD-10-CM | POA: Diagnosis not present

## 2021-01-10 DIAGNOSIS — R296 Repeated falls: Secondary | ICD-10-CM | POA: Diagnosis not present

## 2021-01-10 DIAGNOSIS — R0781 Pleurodynia: Secondary | ICD-10-CM | POA: Diagnosis not present

## 2021-01-10 DIAGNOSIS — R2681 Unsteadiness on feet: Secondary | ICD-10-CM | POA: Diagnosis not present

## 2021-01-16 DIAGNOSIS — Z23 Encounter for immunization: Secondary | ICD-10-CM | POA: Diagnosis not present

## 2021-01-23 ENCOUNTER — Ambulatory Visit: Payer: Medicare Other | Admitting: Diagnostic Neuroimaging

## 2021-01-29 NOTE — Telephone Encounter (Signed)
Received message from [facility] with [facility]. She has questions regarding documentation for insurance and would like to speak to RN. She can be reached at [phone].

## 2021-01-30 NOTE — Telephone Encounter (Signed)
I called Meg back. She sts the clinical authorization department has reviewed the 12/20/2020 note from Dr. Leta Baptist and states more specific information is needed for note to be accepted. She states the H&P was not descriptive enough? I asked how this was the case since Dr. Mora Bellman states memory concerns, gait instability along with multiple falls. Meg states she did not understand why the note was not descriptive enough either, but would have one the clinical authorization reps call and advise on what is exactly needed in the note. After that we will either need to bring the pt back in for a revisit for documentation to be added or Dr. Leta Baptist might agree to an addendum for the 12/20/20 note.

## 2021-02-08 DIAGNOSIS — F3175 Bipolar disorder, in partial remission, most recent episode depressed: Secondary | ICD-10-CM | POA: Diagnosis not present

## 2021-02-15 NOTE — Telephone Encounter (Signed)
Per Meg w/ [facility]:   I need to ask Dr. Leta Baptist to amend the note from 12/20/20.  We can't bill insurance until the note lists a specific diagnosis related to the patient's gait difficulties and falls.  These came from her [facility] Problem List:     Sciatica of right side  Chronic lumbar radiculopathy  DDD (degenerative disc disease), lumbosacral Intertrochanteric fracture (HCC)  So, if Dr. Leta Baptist could add any or all of those in into the section where he talks about her gait, that would do the trick.

## 2021-02-19 NOTE — Telephone Encounter (Signed)
I e-mailed Meg to update her. Thank you!

## 2021-02-23 ENCOUNTER — Other Ambulatory Visit: Payer: Self-pay

## 2021-02-23 ENCOUNTER — Ambulatory Visit (INDEPENDENT_AMBULATORY_CARE_PROVIDER_SITE_OTHER): Payer: Medicare Other | Admitting: Podiatry

## 2021-02-23 ENCOUNTER — Encounter: Payer: Self-pay | Admitting: Podiatry

## 2021-02-23 ENCOUNTER — Ambulatory Visit (INDEPENDENT_AMBULATORY_CARE_PROVIDER_SITE_OTHER): Payer: Medicare Other

## 2021-02-23 DIAGNOSIS — L84 Corns and callosities: Secondary | ICD-10-CM | POA: Diagnosis not present

## 2021-02-23 DIAGNOSIS — M2041 Other hammer toe(s) (acquired), right foot: Secondary | ICD-10-CM

## 2021-02-26 NOTE — Progress Notes (Signed)
Subjective:   Patient ID: [name], female   DOB: 75 y.o.   MRN: [id number]   HPI Patient presents with a painful lesion on the third digit right foot that started in the last week and has been moderately swollen.  Patient's not been seen for fairly long time has poor foot structure poor health and she does not smoke and tries to be active   Review of Systems  All other systems reviewed and are negative.      Objective:  Physical Exam Vitals and nursing note reviewed.  Constitutional:      Appearance: She is well-developed.  Pulmonary:     Effort: Pulmonary effort is normal.  Musculoskeletal:        General: Normal range of motion.  Skin:    General: Skin is warm.  Neurological:     Mental Status: She is alert.    Neurovascular status intact muscle strength found to be adequate range of motion was adequate with patient found to have digital deformities of both feet with distal keratotic tissue formation worse on the third digit right foot with redness of the toe of a mild nature.  There is irritation of the tissue and patient is offloading this area with no other pathology noted and good digital perfusion     Assessment:  Distal infection third digit right secondary to position of the toe with structural keratotic lesion secondary to pressure     Plan:  H&P x-rays reviewed of the right foot and at this point debridement of lesion accomplished flushed the area applied sterile dressing and applied buttress padding to lift the toe and discussed possibility for arthroplasty possibility for infection and possibility long-term there may be be amputation necessary for this digit.  Patient will be seen back to recheck encouraged to call questions concerns  X-rays indicate significant structural malalignment of the digit with no current indication of osteolysis or resorption

## 2021-03-08 DIAGNOSIS — K5190 Ulcerative colitis, unspecified, without complications: Secondary | ICD-10-CM | POA: Diagnosis not present

## 2021-03-08 NOTE — Telephone Encounter (Signed)
Form signed and faxed back to [phone], confirmation received

## 2021-03-15 DIAGNOSIS — M1990 Unspecified osteoarthritis, unspecified site: Secondary | ICD-10-CM | POA: Diagnosis not present

## 2021-03-15 DIAGNOSIS — K5190 Ulcerative colitis, unspecified, without complications: Secondary | ICD-10-CM | POA: Diagnosis not present

## 2021-03-15 DIAGNOSIS — Z79899 Other long term (current) drug therapy: Secondary | ICD-10-CM | POA: Diagnosis not present

## 2021-03-15 DIAGNOSIS — M25569 Pain in unspecified knee: Secondary | ICD-10-CM | POA: Diagnosis not present

## 2021-03-15 DIAGNOSIS — M0760 Enteropathic arthropathies, unspecified site: Secondary | ICD-10-CM | POA: Diagnosis not present

## 2021-03-15 DIAGNOSIS — M79643 Pain in unspecified hand: Secondary | ICD-10-CM | POA: Diagnosis not present

## 2021-05-03 DIAGNOSIS — R5383 Other fatigue: Secondary | ICD-10-CM | POA: Diagnosis not present

## 2021-05-03 DIAGNOSIS — K5190 Ulcerative colitis, unspecified, without complications: Secondary | ICD-10-CM | POA: Diagnosis not present

## 2021-05-14 DIAGNOSIS — M810 Age-related osteoporosis without current pathological fracture: Secondary | ICD-10-CM | POA: Diagnosis not present

## 2021-05-14 DIAGNOSIS — M545 Low back pain, unspecified: Secondary | ICD-10-CM | POA: Diagnosis not present

## 2021-05-14 DIAGNOSIS — G3184 Mild cognitive impairment, so stated: Secondary | ICD-10-CM | POA: Diagnosis not present

## 2021-05-14 DIAGNOSIS — I700 Atherosclerosis of aorta: Secondary | ICD-10-CM | POA: Diagnosis not present

## 2021-05-14 DIAGNOSIS — F411 Generalized anxiety disorder: Secondary | ICD-10-CM | POA: Diagnosis not present

## 2021-05-14 DIAGNOSIS — D649 Anemia, unspecified: Secondary | ICD-10-CM | POA: Diagnosis not present

## 2021-05-14 DIAGNOSIS — F3342 Major depressive disorder, recurrent, in full remission: Secondary | ICD-10-CM | POA: Diagnosis not present

## 2021-05-14 DIAGNOSIS — K5190 Ulcerative colitis, unspecified, without complications: Secondary | ICD-10-CM | POA: Diagnosis not present

## 2021-05-14 DIAGNOSIS — R300 Dysuria: Secondary | ICD-10-CM | POA: Diagnosis not present

## 2021-05-14 DIAGNOSIS — D849 Immunodeficiency, unspecified: Secondary | ICD-10-CM | POA: Diagnosis not present

## 2021-05-14 DIAGNOSIS — I10 Essential (primary) hypertension: Secondary | ICD-10-CM | POA: Diagnosis not present

## 2021-05-14 DIAGNOSIS — E039 Hypothyroidism, unspecified: Secondary | ICD-10-CM | POA: Diagnosis not present

## 2021-05-23 DIAGNOSIS — Z23 Encounter for immunization: Secondary | ICD-10-CM | POA: Diagnosis not present

## 2021-05-30 DIAGNOSIS — R058 Other specified cough: Secondary | ICD-10-CM | POA: Diagnosis not present

## 2021-05-30 DIAGNOSIS — J101 Influenza due to other identified influenza virus with other respiratory manifestations: Secondary | ICD-10-CM | POA: Diagnosis not present

## 2021-05-30 DIAGNOSIS — R519 Headache, unspecified: Secondary | ICD-10-CM | POA: Diagnosis not present

## 2021-05-30 DIAGNOSIS — R0602 Shortness of breath: Secondary | ICD-10-CM | POA: Diagnosis not present

## 2021-05-30 DIAGNOSIS — Z1152 Encounter for screening for COVID-19: Secondary | ICD-10-CM | POA: Diagnosis not present

## 2021-05-30 DIAGNOSIS — R0981 Nasal congestion: Secondary | ICD-10-CM | POA: Diagnosis not present

## 2021-05-31 ENCOUNTER — Other Ambulatory Visit (HOSPITAL_COMMUNITY): Payer: Self-pay | Admitting: *Deleted

## 2021-06-01 ENCOUNTER — Inpatient Hospital Stay (HOSPITAL_COMMUNITY): Admission: RE | Admit: 2021-06-01 | Payer: Medicare Other | Source: Ambulatory Visit

## 2021-07-19 DIAGNOSIS — K5190 Ulcerative colitis, unspecified, without complications: Secondary | ICD-10-CM | POA: Diagnosis not present

## 2021-08-02 DIAGNOSIS — R059 Cough, unspecified: Secondary | ICD-10-CM | POA: Diagnosis not present

## 2021-08-08 DIAGNOSIS — D649 Anemia, unspecified: Secondary | ICD-10-CM | POA: Diagnosis not present

## 2021-08-08 DIAGNOSIS — R2681 Unsteadiness on feet: Secondary | ICD-10-CM | POA: Diagnosis not present

## 2021-08-08 DIAGNOSIS — D849 Immunodeficiency, unspecified: Secondary | ICD-10-CM | POA: Diagnosis not present

## 2021-08-08 DIAGNOSIS — E785 Hyperlipidemia, unspecified: Secondary | ICD-10-CM | POA: Diagnosis not present

## 2021-08-08 DIAGNOSIS — K5190 Ulcerative colitis, unspecified, without complications: Secondary | ICD-10-CM | POA: Diagnosis not present

## 2021-08-08 DIAGNOSIS — R5383 Other fatigue: Secondary | ICD-10-CM | POA: Diagnosis not present

## 2021-08-08 DIAGNOSIS — M810 Age-related osteoporosis without current pathological fracture: Secondary | ICD-10-CM | POA: Diagnosis not present

## 2021-08-08 DIAGNOSIS — E039 Hypothyroidism, unspecified: Secondary | ICD-10-CM | POA: Diagnosis not present

## 2021-08-08 DIAGNOSIS — J45909 Unspecified asthma, uncomplicated: Secondary | ICD-10-CM | POA: Diagnosis not present

## 2021-08-08 DIAGNOSIS — R0609 Other forms of dyspnea: Secondary | ICD-10-CM | POA: Diagnosis not present

## 2021-08-08 DIAGNOSIS — I10 Essential (primary) hypertension: Secondary | ICD-10-CM | POA: Diagnosis not present

## 2021-08-09 DIAGNOSIS — F3175 Bipolar disorder, in partial remission, most recent episode depressed: Secondary | ICD-10-CM | POA: Diagnosis not present

## 2021-08-09 DIAGNOSIS — F900 Attention-deficit hyperactivity disorder, predominantly inattentive type: Secondary | ICD-10-CM | POA: Diagnosis not present

## 2021-08-21 ENCOUNTER — Ambulatory Visit: Payer: Medicare Other | Admitting: Cardiology

## 2021-08-21 ENCOUNTER — Ambulatory Visit: Payer: No Typology Code available for payment source | Admitting: Cardiology

## 2021-08-21 ENCOUNTER — Other Ambulatory Visit: Payer: Self-pay

## 2021-08-21 ENCOUNTER — Encounter: Payer: Self-pay | Admitting: Cardiology

## 2021-08-21 VITALS — BP 140/74 | HR 67 | Temp 97.2°F | Resp 16 | Ht 65.0 in | Wt 164.8 lb

## 2021-08-21 DIAGNOSIS — K51919 Ulcerative colitis, unspecified with unspecified complications: Secondary | ICD-10-CM | POA: Diagnosis not present

## 2021-08-21 DIAGNOSIS — R0989 Other specified symptoms and signs involving the circulatory and respiratory systems: Secondary | ICD-10-CM | POA: Diagnosis not present

## 2021-08-21 DIAGNOSIS — E039 Hypothyroidism, unspecified: Secondary | ICD-10-CM

## 2021-08-21 DIAGNOSIS — I10 Essential (primary) hypertension: Secondary | ICD-10-CM

## 2021-08-21 DIAGNOSIS — E782 Mixed hyperlipidemia: Secondary | ICD-10-CM

## 2021-08-21 DIAGNOSIS — R0602 Shortness of breath: Secondary | ICD-10-CM | POA: Diagnosis not present

## 2021-08-21 DIAGNOSIS — F988 Other specified behavioral and emotional disorders with onset usually occurring in childhood and adolescence: Secondary | ICD-10-CM

## 2021-08-21 NOTE — Progress Notes (Signed)
Date:  08/21/2021   ID:  [name], DOB 1945-10-23, MRN [id number]  PCP:  Ginger Organ., MD  Cardiologist:  Rex Kras, DO, [facility] (established care 08/21/21)  REASON FOR CONSULT: Dyspnea   REQUESTING PHYSICIAN:  Ginger Organ., MD [address] [place],  [place] [address]  Chief Complaint  Patient presents with   [place] Patient (Initial Visit)    Referred by Reginold Agent    HPI  [name] is a 76 y.o. Caucasian female who presents to the office with a chief complaint of "shortness of breath." Patient's past medical history and cardiovascular risk factors include: Hypothyroidism, hypertension, hyperlipidemia asthma, anxiety/depression, ADD, former smoker.   She is referred to the office at the request of Ginger Organ., MD for evaluation of Dyspnea.  Patient is accompanied by her friend/caregiver [name] at today's office visit.  Patient provides verbal consent with regards to having Ms. Adams present during today's office encounter.  Patient states that she has been experiencing shortness of breath for the last 1 month.  According to her she had the flu back in November 2022 and pneumonia in December 2022 she was treated with antibiotics and other medications according to her.  She also has been evaluated pulmonary medicine and no unifying diagnosis of her shortness of breath has been identified.  Patient states that she had a chest x-ray recently which was also negative.  She denies orthopnea, paroxysmal nocturnal dyspnea or lower extremity.  Shortness of breath is more pronounced during the night prior to going to bed.  FUNCTIONAL STATUS: No structured exercise program or daily routine.   ALLERGIES: Allergies  Allergen Reactions   Ampicillin Nausea And Vomiting   Bee Pollen     Other reaction(s): Other (See Comments)   Erythromycin Nausea And Vomiting and Other (See Comments)    Can take Z pac   Hctz  [Hydrochlorothiazide] Other (See Comments)    dizzy   Metoprolol Other (See Comments)    doublevision    Molds & Smuts Other (See Comments)    Pt coughs and uses an inhaler.   Morphine And Related Nausea And Vomiting    No problem with hydrocodone   Penicillin G Other (See Comments)    Unknown Has patient had a PCN reaction causing immediate rash, facial/tongue/throat swelling, SOB or lightheadedness with hypotension: Unknown Has patient had a PCN reaction causing severe rash involving mucus membranes or skin necrosis: Unknown Has patient had a PCN reaction that required hospitalization: Unknown Has patient had a PCN reaction occurring within the last 10 years: Unknown If all of the above answers are "NO", then may proceed with Cephalosporin use.     MEDICATION LIST PRIOR TO VISIT: Current Meds  Medication Sig   ALPRAZolam (XANAX) 0.25 MG tablet Take 0.25 mg by mouth 2 (two) times daily as needed for anxiety or sleep.   amLODipine-valsartan (EXFORGE) 5-160 MG tablet Take 1 tablet by mouth daily.   budesonide-formoterol (SYMBICORT) 160-4.5 MCG/ACT inhaler Inhale 2 puffs into the lungs in the morning and at bedtime.   buPROPion (WELLBUTRIN XL) 300 MG 24 hr tablet Take 1 tablet (300 mg total) by mouth daily.   celecoxib (CELEBREX) 200 MG capsule Take 200 mg by mouth daily.   Cholecalciferol (VITAMIN D PO) Take 1 tablet by mouth daily.   CONCERTA 36 MG CR tablet Take 36 mg by mouth daily.    FLUoxetine (PROZAC) 20 MG capsule Take 3 capsules (  60 mg total) by mouth daily.   HYDROcodone-acetaminophen (NORCO) 7.5-325 MG tablet Take 1-2 tablets by mouth every 4 (four) hours as needed for moderate pain.   inFLIXimab (REMICADE) 100 MG injection IV infusion every 2 months   lamoTRIgine (LAMICTAL) 100 MG tablet Take 100 mg by mouth 3 (three) times daily.    methocarbamol (ROBAXIN) 500 MG tablet Take 1 tablet (500 mg total) by mouth every 6 (six) hours as needed for muscle spasms.   pantoprazole  (PROTONIX) 40 MG tablet TAKE ONE TABLET TWICE DAILY (Patient taking differently: Take 40 mg by mouth 2 (two) times daily.)   rosuvastatin (CRESTOR) 10 MG tablet Take 10 mg by mouth daily.    SYNTHROID 50 MCG tablet TAKE 2 TABLETS EVERY MORNING (Patient taking differently: Take 100 mcg by mouth daily before breakfast.)   VENTOLIN HFA 108 (90 Base) MCG/ACT inhaler ONE PUFF FOUR TIMES DAILY AS NEEDED (Patient taking differently: Inhale 1 puff into the lungs every 6 (six) hours as needed for wheezing or shortness of breath.)     PAST MEDICAL HISTORY: Past Medical History:  Diagnosis Date   ADD (attention deficit disorder)    Allergy    Anxiety    Asthma    Attention deficit disorder without mention of hyperactivity    Back pain    Colitis 12/05/2010   Colon polyps    Complication of anesthesia    problem with grogginess and a long time to get over Anesthesia   Depression    Diverticulosis    GERD (gastroesophageal reflux disease)    H/O hiatal hernia    Hypertension    IBS (irritable bowel syndrome)    MCI (mild cognitive impairment)    Memory changes    Osteoarthrosis, unspecified whether generalized or localized, unspecified site    Other and unspecified hyperlipidemia    Renal cyst 10/2015   simple left   Repeated falls    Thyroid disease    Ulcerative colitis    Unspecified asthma(493.90)    Unspecified essential hypertension    taken off meds since lost wt    PAST SURGICAL HISTORY: Past Surgical History:  Procedure Laterality Date   ABDOMINAL EXPOSURE N/A 04/22/2014   Procedure: ABDOMINAL EXPOSURE;  Surgeon: Angelia Mould, MD;  Location: [facility];  Service: Vascular;  Laterality: N/A;   ANTERIOR LATERAL LUMBAR FUSION 4 LEVELS Left 04/22/2014   Procedure: Left Lumbar one/two, two/three, three/four, four,five. Anterior lateral lumbar interbody fusion**Stage 1**;  Surgeon: Erline Levine, MD;  Location: [place] [facility];  Service: Neurosurgery;  Laterality: Left;    ANTERIOR LUMBAR FUSION N/A 04/22/2014   Procedure: Lumbar five/-Sacral one. Anterior lumbar interbody fusion with Dr. Scot Dock; Left Lumbar one/two, two/three/three/four, four/five. Anterior lateral lumbar interbody fusion**Stage 1**;  Surgeon: Erline Levine, MD;  Location: [place] [facility];  Service: Neurosurgery;  Laterality: N/A;   BACK SURGERY     fluid removed from between disks   BREAST SURGERY     bil breast reduction    CARPAL TUNNEL RELEASE     CHOLECYSTECTOMY     Laparoscopic cholecystectomy   COLONOSCOPY     HIP FRACTURE SURGERY     left  11/01/2013   LUMBAR PERCUTANEOUS PEDICLE SCREW 4 LEVEL N/A 04/25/2014   Procedure: **Stage 2** Percutaneous pedicle screw placement L1-5;  Surgeon: Erline Levine, MD;  Location: [place] [facility];  Service: Neurosurgery;  Laterality: N/A;  **Stage 2** Percutaneous pedicle screw placement L1-5   SHOULDER SURGERY     right shoulder  3x    TOE SURGERY     right big toe   TOTAL HIP ARTHROPLASTY Right 07/07/2018   Procedure: TOTAL HIP ARTHROPLASTY ANTERIOR APPROACH;  Surgeon: Paralee Cancel, MD;  Location: [facility];  Service: Orthopedics;  Laterality: Right;  70 mins   TOTAL SHOULDER ARTHROPLASTY     UPPER GASTROINTESTINAL ENDOSCOPY  09/28/2016   Dilation    FAMILY HISTORY: The patient family history includes Arthritis in her father; Colon cancer (age of onset: 60) in her father; Healthy in her brother and daughter; Heart disease in her father and mother; Hypertension in her father.  SOCIAL HISTORY:  The patient  reports that she quit smoking about 47 years ago. Her smoking use included cigarettes. She has never used smokeless tobacco. She reports current alcohol use of about 1.0 standard drink per week. She reports that she does not use drugs.  REVIEW OF SYSTEMS: Review of Systems  Constitutional: Positive for malaise/fatigue.  Cardiovascular:  Positive for dyspnea on exertion and paroxysmal nocturnal dyspnea. Negative for chest pain, leg swelling,  orthopnea, palpitations and syncope.  Respiratory:  Positive for shortness of breath.    PHYSICAL EXAM: Vitals with BMI 08/21/2021 12/20/2020 10/16/2020  Height 5\' 5"  5\' 5"  5\' 5"   Weight 164 lbs 13 oz 162 lbs 6 oz -  BMI 70.62 37.62 -  Systolic 831 517 616  Diastolic 74 78 68  Pulse 67 64 77    CONSTITUTIONAL: Well-developed and well-nourished. No acute distress. Ambulates with cane.  SKIN: Skin is warm and dry. No rash noted. No cyanosis. No pallor. No jaundice HEAD: Normocephalic and atraumatic.  EYES: No scleral icterus MOUTH/THROAT: Moist oral membranes.  NECK: No JVD present. No thyromegaly noted. Bilateral carotid bruits  LYMPHATIC: No visible cervical adenopathy.  CHEST Normal respiratory effort. No intercostal retractions  LUNGS: Clear to auscultation bilaterally.  No stridor. No wheezes. No rales.  CARDIOVASCULAR: Regular rate and rhythm, positive W7-P7, soft holosystolic murmur heard over the left lower sternal border, no rubs or gallops appreciated. ABDOMINAL: Soft, nontender, nondistended, positive bowel sounds in all 4 quadrants, no apparent ascites.  EXTREMITIES: No peripheral edema, warm to touch, 2+ bilateral DP and PT pulses HEMATOLOGIC: No significant bruising NEUROLOGIC: Oriented to person, place, and time. Nonfocal. Normal muscle tone.  PSYCHIATRIC: Normal mood and affect. Normal behavior. Cooperative  CARDIAC DATABASE: EKG: 08/21/2021: Normal sinus rhythm, 64 bpm, without underlying injury pattern.  Echocardiogram: No results found for this or any previous visit from the past 1095 days.    Stress Testing: No results found for this or any previous visit from the past 1095 days.   Heart Catheterization: None  LABORATORY DATA: External Labs: Collected: October 11, 2020 TSH 1.88  Collected: 08/08/2021: Hemoglobin 12.1 g/dL, hematocrit 37.9%. BUN 18, creatinine 0.8. Sodium 138, potassium 4.5, chloride 105, bicarb 22. AST 28, ALT 24, alkaline phosphatase  69 TSH 0.99  Collected 10/11/2020: Total cholesterol 165, triglycerides 94, HDL 61, LDL 85, non-HDL 104  IMPRESSION:    ICD-10-CM   1. Shortness of breath  R06.02 EKG 12-Lead    PCV ECHOCARDIOGRAM COMPLETE    Pro b natriuretic peptide (BNP)    Basic metabolic panel    2. Essential hypertension  I10 PCV ECHOCARDIOGRAM COMPLETE    3. Mixed hyperlipidemia  E78.2     4. Hypothyroidism, unspecified type  E03.9     5. Attention deficit disorder, unspecified hyperactivity presence  F98.8     6. Ulcerative colitis with complication, unspecified location [facility])  K51.919  7. Bilateral carotid bruits  R09.89 PCV CAROTID DUPLEX (BILATERAL)       RECOMMENDATIONS: Emmalynn Pinkham is a 76 y.o. Caucasian female whose past medical history and cardiac risk factors include: Hypothyroidism, hypertension, hyperlipidemia asthma, anxiety/depression, ADD, former smoker.   Shortness of breath Has been short of breath since her flu and pneumonia that occurred back in November/December 2022. Has been evaluated by pulmonary medicine according to the patient and her caregiver and the work-up has been essentially unremarkable. Now referred to cardiology for further evaluation and management. Clinically appears euvolemic and not in congestive heart failure. We will still check a BMP and NT proBNP. Echo will be ordered to evaluate for structural heart disease and left ventricular systolic function.  Also would like to evaluate her RVSP and screen her for possible pulmonary hypertension as she has been on stimulant medications for ADHD for several years. Patient is unable to exercise as she ambulates with a cane and does not complain of any anginal discomfort.  We will hold off on stress test at this time.  We will discuss at the next visit.  Essential hypertension Office blood pressures within acceptable range but currently not at goal. We emphasized the importance of low-salt diet. Currently  managed by primary care provider.  Mixed hyperlipidemia Currently on rosuvastatin.   Denies myalgia or other side effects. Most recent lipids dated April 2022 reviewed as noted above. Currently managed by primary care provider.  Bilateral carotid bruits Check carotid duplex to evaluate for atherosclerosis/stenosis.  As part of today's office visit reviewed outside office notes and labs provided by referring physician via [facility] pertinent information discussed above, discussed symptom management and current work-up additional diagnostic work-up ordered, further recommendations to follow.  FINAL MEDICATION LIST END OF ENCOUNTER: No orders of the defined types were placed in this encounter.   Medications Discontinued During This Encounter  Medication Reason   Cholecalciferol (VITAMIN D3) 100000 UNIT/GM POWD    polyethylene glycol (MIRALAX / GLYCOLAX) packet      Current Outpatient Medications:    ALPRAZolam (XANAX) 0.25 MG tablet, Take 0.25 mg by mouth 2 (two) times daily as needed for anxiety or sleep., Disp: , Rfl:    amLODipine-valsartan (EXFORGE) 5-160 MG tablet, Take 1 tablet by mouth daily., Disp: 90 tablet, Rfl: 1   budesonide-formoterol (SYMBICORT) 160-4.5 MCG/ACT inhaler, Inhale 2 puffs into the lungs in the morning and at bedtime., Disp: 10.2 g, Rfl: 5   buPROPion (WELLBUTRIN XL) 300 MG 24 hr tablet, Take 1 tablet (300 mg total) by mouth daily., Disp: 90 tablet, Rfl: 1   celecoxib (CELEBREX) 200 MG capsule, Take 200 mg by mouth daily., Disp: , Rfl:    Cholecalciferol (VITAMIN D PO), Take 1 tablet by mouth daily., Disp: , Rfl:    CONCERTA 36 MG CR tablet, Take 36 mg by mouth daily. , Disp: , Rfl:    FLUoxetine (PROZAC) 20 MG capsule, Take 3 capsules (60 mg total) by mouth daily., Disp: 90 capsule, Rfl: 3   HYDROcodone-acetaminophen (NORCO) 7.5-325 MG tablet, Take 1-2 tablets by mouth every 4 (four) hours as needed for moderate pain., Disp: 60 tablet, Rfl: 0    inFLIXimab (REMICADE) 100 MG injection, IV infusion every 2 months, Disp: , Rfl:    lamoTRIgine (LAMICTAL) 100 MG tablet, Take 100 mg by mouth 3 (three) times daily. , Disp: , Rfl:    methocarbamol (ROBAXIN) 500 MG tablet, Take 1 tablet (500 mg total) by mouth every 6 (six) hours as  needed for muscle spasms., Disp: 40 tablet, Rfl: 0   pantoprazole (PROTONIX) 40 MG tablet, TAKE ONE TABLET TWICE DAILY (Patient taking differently: Take 40 mg by mouth 2 (two) times daily.), Disp: 60 tablet, Rfl: 11   rosuvastatin (CRESTOR) 10 MG tablet, Take 10 mg by mouth daily. , Disp: , Rfl:    SYNTHROID 50 MCG tablet, TAKE 2 TABLETS EVERY MORNING (Patient taking differently: Take 100 mcg by mouth daily before breakfast.), Disp: 180 tablet, Rfl: 1   VENTOLIN HFA 108 (90 Base) MCG/ACT inhaler, ONE PUFF FOUR TIMES DAILY AS NEEDED (Patient taking differently: Inhale 1 puff into the lungs every 6 (six) hours as needed for wheezing or shortness of breath.), Disp: 18 g, Rfl: 3  Orders Placed This Encounter  Procedures   Pro b natriuretic peptide (BNP)   Basic metabolic panel   EKG 69-GEXB   PCV ECHOCARDIOGRAM COMPLETE   PCV CAROTID DUPLEX (BILATERAL)    There are no Patient Instructions on file for this visit.   --Continue cardiac medications as reconciled in final medication list. --Return in about 4 weeks (around 09/18/2021) for Follow up, Dyspnea. Or sooner if needed. --Continue follow-up with your primary care physician regarding the management of your other chronic comorbid conditions.  Patient's questions and concerns were addressed to her satisfaction. She voices understanding of the instructions provided during this encounter.   This note was created using a voice recognition software as a result there may be grammatical errors inadvertently enclosed that do not reflect the nature of this encounter. Every attempt is made to correct such errors.  Rex Kras, [place], [facility]  Pager: [phone] Office:  [phone]

## 2021-08-31 ENCOUNTER — Other Ambulatory Visit: Payer: Self-pay

## 2021-08-31 DIAGNOSIS — R0602 Shortness of breath: Secondary | ICD-10-CM

## 2021-09-07 ENCOUNTER — Ambulatory Visit: Payer: Medicare Other

## 2021-09-07 ENCOUNTER — Other Ambulatory Visit: Payer: Self-pay

## 2021-09-07 DIAGNOSIS — I10 Essential (primary) hypertension: Secondary | ICD-10-CM | POA: Diagnosis not present

## 2021-09-07 DIAGNOSIS — R0989 Other specified symptoms and signs involving the circulatory and respiratory systems: Secondary | ICD-10-CM

## 2021-09-07 DIAGNOSIS — R0602 Shortness of breath: Secondary | ICD-10-CM | POA: Diagnosis not present

## 2021-09-11 ENCOUNTER — Other Ambulatory Visit: Payer: Self-pay | Admitting: Cardiology

## 2021-09-11 DIAGNOSIS — R0602 Shortness of breath: Secondary | ICD-10-CM | POA: Diagnosis not present

## 2021-09-12 LAB — BASIC METABOLIC PANEL
BUN/Creatinine Ratio: 18 (ref 12–28)
BUN: 18 mg/dL (ref 8–27)
CO2: 23 mmol/L (ref 20–29)
Calcium: 10 mg/dL (ref 8.7–10.3)
Chloride: 105 mmol/L (ref 96–106)
Creatinine, Ser: 0.98 mg/dL (ref 0.57–1.00)
Glucose: 77 mg/dL (ref 70–99)
Potassium: 4.5 mmol/L (ref 3.5–5.2)
Sodium: 144 mmol/L (ref 134–144)
eGFR: 60 mL/min/{1.73_m2} (ref >59)

## 2021-09-12 LAB — PRO B NATRIURETIC PEPTIDE: NT-Pro BNP: 234 pg/mL (ref 0–738)

## 2021-09-13 DIAGNOSIS — K5190 Ulcerative colitis, unspecified, without complications: Secondary | ICD-10-CM | POA: Diagnosis not present

## 2021-09-18 ENCOUNTER — Other Ambulatory Visit: Payer: Self-pay

## 2021-09-18 ENCOUNTER — Ambulatory Visit: Payer: Medicare Other | Admitting: Cardiology

## 2021-09-18 ENCOUNTER — Encounter: Payer: Self-pay | Admitting: Cardiology

## 2021-09-18 VITALS — BP 152/72 | HR 71 | Temp 98.0°F | Resp 10 | Ht 65.0 in | Wt 171.4 lb

## 2021-09-18 DIAGNOSIS — R0602 Shortness of breath: Secondary | ICD-10-CM

## 2021-09-18 DIAGNOSIS — E782 Mixed hyperlipidemia: Secondary | ICD-10-CM

## 2021-09-18 DIAGNOSIS — I10 Essential (primary) hypertension: Secondary | ICD-10-CM

## 2021-09-18 DIAGNOSIS — I6523 Occlusion and stenosis of bilateral carotid arteries: Secondary | ICD-10-CM

## 2021-09-18 MED ORDER — ASPIRIN EC 81 MG PO TBEC: 81.0000 mg | DELAYED_RELEASE_TABLET | Freq: Every day | ORAL | 11 refills | Status: DC

## 2021-09-18 NOTE — Progress Notes (Signed)
? ?Date:  09/18/2021  ? ?ID:  [name], DOB 1945-09-26, MRN [id number] ? ?PCP:  Ginger Organ., MD  ?Cardiologist:  Rex Kras, DO, [facility] (established care 08/21/21) ? ?Date: 09/18/21 ?Last Office Visit: 08/21/2021 ? ?Chief Complaint  ?Patient presents with  ? Follow-up  ?  Reevaluation of dyspnea and discuss test results.  ? ? ?HPI  ?[name] is a 76 y.o. Caucasian female whose past medical history and cardiovascular risk factors include: Hypothyroidism, hypertension, hyperlipidemia asthma, anxiety/depression, ADD, former smoker.  ? ?She is referred to the office at the request of Ginger Organ., MD for evaluation of Dyspnea. ? ?This patient is accompanied in the office by her daughter,[name] . [name] provides verbal consent with regards to having her present during today's encounter. ? ?Patient had the flu back in November 2022 and pneumonia following that in December 2022 and since then has been short of breath.  She is been evaluated pulmonary medicine without any unifying diagnosis to explain her underlying shortness of breath.  And now referred to cardiology for further evaluation and management.  Since last office visit labs were performed and independently reviewed which note normal NT proBNP.  Clinically she is also euvolemic not suggestive of congestive heart failure.  Echocardiogram was performed which notes preserved LVEF, grade 1 diastolic impairment, mild aortic stenosis (new finding) and no pulmonary hypertension.  Results reviewed with the patient and her daughter in great detail at today's office visit and noted below for further reference. ? ?Office blood pressures are not well controlled.  Home blood pressures are possibly better controlled but not often checked. ? ?During initial consultation carotid bruits were appreciated and carotid duplex was ordered.  She is noted to have bilateral carotid artery stenosis as noted below.  Currently on  rosuvastatin and has follow-up labs in April 2023 with PCP. ? ?FUNCTIONAL STATUS: ?No structured exercise program or daily routine.  ? ?ALLERGIES: ?Allergies  ?Allergen Reactions  ? Ampicillin Nausea And Vomiting  ? Bee Pollen   ?  Other reaction(s): Other (See Comments)  ? Erythromycin Nausea And Vomiting and Other (See Comments)  ?  Can take Z pac  ? Hctz [Hydrochlorothiazide] Other (See Comments)  ?  dizzy  ? Metoprolol Other (See Comments)  ?  doublevision   ? Molds & Smuts Other (See Comments)  ?  Pt coughs and uses an inhaler.  ? Morphine And Related Nausea And Vomiting  ?  No problem with hydrocodone  ? Penicillin G Other (See Comments)  ?  Unknown ?Has patient had a PCN reaction causing immediate rash, facial/tongue/throat swelling, SOB or lightheadedness with hypotension: Unknown ?Has patient had a PCN reaction causing severe rash involving mucus membranes or skin necrosis: Unknown ?Has patient had a PCN reaction that required hospitalization: Unknown ?Has patient had a PCN reaction occurring within the last 10 years: Unknown ?If all of the above answers are "NO", then may proceed with Cephalosporin use. ?  ? ? ?MEDICATION LIST PRIOR TO VISIT: ?Current Meds  ?Medication Sig  ? aspirin EC 81 MG tablet Take 1 tablet (81 mg total) by mouth daily. Swallow whole.  ?  ? ?PAST MEDICAL HISTORY: ?Past Medical History:  ?Diagnosis Date  ? ADD (attention deficit disorder)   ? Allergy   ? Anxiety   ? Asthma   ? Attention deficit disorder without mention of hyperactivity   ? Back pain   ? Colitis 12/05/2010  ? Colon polyps   ?  Complication of anesthesia   ? problem with grogginess and a long time to get over Anesthesia  ? Depression   ? Diverticulosis   ? GERD (gastroesophageal reflux disease)   ? H/O hiatal hernia   ? Hypertension   ? IBS (irritable bowel syndrome)   ? MCI (mild cognitive impairment)   ? Memory changes   ? Osteoarthrosis, unspecified whether generalized or localized, unspecified site   ? Other and  unspecified hyperlipidemia   ? Renal cyst 10/2015  ? simple left  ? Repeated falls   ? Thyroid disease   ? Ulcerative colitis   ? Unspecified asthma(493.90)   ? Unspecified essential hypertension   ? taken off meds since lost wt  ? ? ?PAST SURGICAL HISTORY: ?Past Surgical History:  ?Procedure Laterality Date  ? ABDOMINAL EXPOSURE N/A 04/22/2014  ? Procedure: ABDOMINAL EXPOSURE;  Surgeon: Angelia Mould, MD;  Location: [facility];  Service: Vascular;  Laterality: N/A;  ? ANTERIOR LATERAL LUMBAR FUSION 4 LEVELS Left 04/22/2014  ? Procedure: Left Lumbar one/two, two/three, three/four, four,five. Anterior lateral lumbar interbody fusion**Stage 1**;  Surgeon: Erline Levine, MD;  Location: [place] [facility];  Service: Neurosurgery;  Laterality: Left;  ? ANTERIOR LUMBAR FUSION N/A 04/22/2014  ? Procedure: Lumbar five/-Sacral one. Anterior lumbar interbody fusion with Dr. Scot Dock; Left Lumbar one/two, two/three/three/four, four/five. Anterior lateral lumbar interbody fusion**Stage 1**;  Surgeon: Erline Levine, MD;  Location: [place] [facility];  Service: Neurosurgery;  Laterality: N/A;  ? BACK SURGERY    ? fluid removed from between disks  ? BREAST SURGERY    ? bil breast reduction   ? CARPAL TUNNEL RELEASE    ? CHOLECYSTECTOMY    ? Laparoscopic cholecystectomy  ? COLONOSCOPY    ? HIP FRACTURE SURGERY    ? left  11/01/2013  ? LUMBAR PERCUTANEOUS PEDICLE SCREW 4 LEVEL N/A 04/25/2014  ? Procedure: **Stage 2** Percutaneous pedicle screw placement L1-5;  Surgeon: Erline Levine, MD;  Location: [place] [facility];  Service: Neurosurgery;  Laterality: N/A;  **Stage 2** Percutaneous pedicle screw placement L1-5  ? SHOULDER SURGERY    ? right shoulder 3x   ? TOE SURGERY    ? right big toe  ? TOTAL HIP ARTHROPLASTY Right 07/07/2018  ? Procedure: TOTAL HIP ARTHROPLASTY ANTERIOR APPROACH;  Surgeon: Paralee Cancel, MD;  Location: [facility];  Service: Orthopedics;  Laterality: Right;  70 mins  ? TOTAL SHOULDER ARTHROPLASTY    ? UPPER GASTROINTESTINAL  ENDOSCOPY  09/28/2016  ? Dilation  ? ? ?FAMILY HISTORY: ?The patient family history includes Arthritis in her father; Colon cancer (age of onset: 31) in her father; Healthy in her brother and daughter; Heart disease in her father and mother; Hypertension in her father. ? ?SOCIAL HISTORY:  ?The patient  reports that she quit smoking about 47 years ago. Her smoking use included cigarettes. She has never used smokeless tobacco. She reports current alcohol use of about 1.0 standard drink per week. She reports that she does not use drugs. ? ?REVIEW OF SYSTEMS: ?Review of Systems  ?Constitutional: Positive for malaise/fatigue.  ?Cardiovascular:  Positive for dyspnea on exertion and paroxysmal nocturnal dyspnea. Negative for chest pain, leg swelling, orthopnea, palpitations and syncope.  ?Respiratory:  Positive for shortness of breath.   ? ?PHYSICAL EXAM: ? ?  09/18/2021  ?  3:55 PM 08/21/2021  ?  9:15 AM 12/20/2020  ?  8:52 AM  ?Vitals with BMI  ?Height 5' 5"  5' 5"  5' 5"   ?Weight 171 lbs 6  oz 164 lbs 13 oz 162 lbs 6 oz  ?BMI 28.52 27.42 27.02  ?Systolic 414 436 016  ?Diastolic 72 74 78  ?Pulse 71 67 64  ? ? ?CONSTITUTIONAL: Well-developed and well-nourished. No acute distress. Ambulates with cane.  ?SKIN: Skin is warm and dry. No rash noted. No cyanosis. No pallor. No jaundice ?HEAD: Normocephalic and atraumatic.  ?EYES: No scleral icterus ?MOUTH/THROAT: Moist oral membranes.  ?NECK: No JVD present. No thyromegaly noted. Bilateral carotid bruits  ?LYMPHATIC: No visible cervical adenopathy.  ?CHEST Normal respiratory effort. No intercostal retractions  ?LUNGS: Clear to auscultation bilaterally.  No stridor. No wheezes. No rales.  ?CARDIOVASCULAR: Regular rate and rhythm, positive D8-K0, soft holosystolic murmur heard over the left lower sternal border, no rubs or gallops appreciated. ?ABDOMINAL: Soft, nontender, nondistended, positive bowel sounds in all 4 quadrants, no apparent ascites.  ?EXTREMITIES: No peripheral edema,  warm to touch, 2+ bilateral DP and PT pulses ?HEMATOLOGIC: No significant bruising ?NEUROLOGIC: Oriented to person, place, and time. Nonfocal. Normal muscle tone.  ?PSYCHIATRIC: Normal mood and affect. Constance Holster

## 2021-09-25 DIAGNOSIS — M25551 Pain in right hip: Secondary | ICD-10-CM | POA: Diagnosis not present

## 2021-10-03 DIAGNOSIS — F3175 Bipolar disorder, in partial remission, most recent episode depressed: Secondary | ICD-10-CM | POA: Diagnosis not present

## 2021-10-10 DIAGNOSIS — M25551 Pain in right hip: Secondary | ICD-10-CM | POA: Diagnosis not present

## 2021-10-15 ENCOUNTER — Ambulatory Visit: Payer: Medicare Other

## 2021-10-17 DIAGNOSIS — M25551 Pain in right hip: Secondary | ICD-10-CM | POA: Diagnosis not present

## 2021-10-19 DIAGNOSIS — M25551 Pain in right hip: Secondary | ICD-10-CM | POA: Diagnosis not present

## 2021-10-22 DIAGNOSIS — M25551 Pain in right hip: Secondary | ICD-10-CM | POA: Diagnosis not present

## 2021-10-24 ENCOUNTER — Ambulatory Visit: Payer: Medicare Other

## 2021-10-24 DIAGNOSIS — R0602 Shortness of breath: Secondary | ICD-10-CM | POA: Diagnosis not present

## 2021-10-24 DIAGNOSIS — I6523 Occlusion and stenosis of bilateral carotid arteries: Secondary | ICD-10-CM | POA: Diagnosis not present

## 2021-10-24 DIAGNOSIS — I10 Essential (primary) hypertension: Secondary | ICD-10-CM | POA: Diagnosis not present

## 2021-10-24 DIAGNOSIS — E782 Mixed hyperlipidemia: Secondary | ICD-10-CM | POA: Diagnosis not present

## 2021-10-29 DIAGNOSIS — M810 Age-related osteoporosis without current pathological fracture: Secondary | ICD-10-CM | POA: Diagnosis not present

## 2021-10-29 DIAGNOSIS — E785 Hyperlipidemia, unspecified: Secondary | ICD-10-CM | POA: Diagnosis not present

## 2021-10-29 DIAGNOSIS — I10 Essential (primary) hypertension: Secondary | ICD-10-CM | POA: Diagnosis not present

## 2021-10-29 DIAGNOSIS — R7989 Other specified abnormal findings of blood chemistry: Secondary | ICD-10-CM | POA: Diagnosis not present

## 2021-10-29 DIAGNOSIS — R5383 Other fatigue: Secondary | ICD-10-CM | POA: Diagnosis not present

## 2021-10-29 DIAGNOSIS — E039 Hypothyroidism, unspecified: Secondary | ICD-10-CM | POA: Diagnosis not present

## 2021-10-29 DIAGNOSIS — M25551 Pain in right hip: Secondary | ICD-10-CM | POA: Diagnosis not present

## 2021-11-01 DIAGNOSIS — F3175 Bipolar disorder, in partial remission, most recent episode depressed: Secondary | ICD-10-CM | POA: Diagnosis not present

## 2021-11-01 DIAGNOSIS — F900 Attention-deficit hyperactivity disorder, predominantly inattentive type: Secondary | ICD-10-CM | POA: Diagnosis not present

## 2021-11-05 DIAGNOSIS — I700 Atherosclerosis of aorta: Secondary | ICD-10-CM | POA: Diagnosis not present

## 2021-11-05 DIAGNOSIS — E785 Hyperlipidemia, unspecified: Secondary | ICD-10-CM | POA: Diagnosis not present

## 2021-11-05 DIAGNOSIS — Z0000 Encounter for general adult medical examination without abnormal findings: Secondary | ICD-10-CM | POA: Diagnosis not present

## 2021-11-05 DIAGNOSIS — J45909 Unspecified asthma, uncomplicated: Secondary | ICD-10-CM | POA: Diagnosis not present

## 2021-11-05 DIAGNOSIS — Z1331 Encounter for screening for depression: Secondary | ICD-10-CM | POA: Diagnosis not present

## 2021-11-05 DIAGNOSIS — K5190 Ulcerative colitis, unspecified, without complications: Secondary | ICD-10-CM | POA: Diagnosis not present

## 2021-11-05 DIAGNOSIS — F411 Generalized anxiety disorder: Secondary | ICD-10-CM | POA: Diagnosis not present

## 2021-11-05 DIAGNOSIS — Z1339 Encounter for screening examination for other mental health and behavioral disorders: Secondary | ICD-10-CM | POA: Diagnosis not present

## 2021-11-05 DIAGNOSIS — M810 Age-related osteoporosis without current pathological fracture: Secondary | ICD-10-CM | POA: Diagnosis not present

## 2021-11-05 DIAGNOSIS — I6523 Occlusion and stenosis of bilateral carotid arteries: Secondary | ICD-10-CM | POA: Diagnosis not present

## 2021-11-05 DIAGNOSIS — F3342 Major depressive disorder, recurrent, in full remission: Secondary | ICD-10-CM | POA: Diagnosis not present

## 2021-11-05 DIAGNOSIS — E039 Hypothyroidism, unspecified: Secondary | ICD-10-CM | POA: Diagnosis not present

## 2021-11-05 DIAGNOSIS — D849 Immunodeficiency, unspecified: Secondary | ICD-10-CM | POA: Diagnosis not present

## 2021-11-05 DIAGNOSIS — I10 Essential (primary) hypertension: Secondary | ICD-10-CM | POA: Diagnosis not present

## 2021-11-06 ENCOUNTER — Encounter: Payer: Self-pay | Admitting: Cardiology

## 2021-11-06 ENCOUNTER — Ambulatory Visit: Payer: Medicare Other | Admitting: Cardiology

## 2021-11-06 ENCOUNTER — Other Ambulatory Visit: Payer: Self-pay

## 2021-11-06 VITALS — BP 121/63 | HR 70 | Temp 97.9°F | Resp 16 | Ht 65.0 in | Wt 168.0 lb

## 2021-11-06 DIAGNOSIS — R0602 Shortness of breath: Secondary | ICD-10-CM

## 2021-11-06 DIAGNOSIS — E782 Mixed hyperlipidemia: Secondary | ICD-10-CM | POA: Diagnosis not present

## 2021-11-06 DIAGNOSIS — I6523 Occlusion and stenosis of bilateral carotid arteries: Secondary | ICD-10-CM | POA: Diagnosis not present

## 2021-11-06 DIAGNOSIS — M810 Age-related osteoporosis without current pathological fracture: Secondary | ICD-10-CM | POA: Insufficient documentation

## 2021-11-06 DIAGNOSIS — R5383 Other fatigue: Secondary | ICD-10-CM

## 2021-11-06 NOTE — Progress Notes (Signed)
? ?Date:  11/06/2021  ? ?ID:  [name], DOB 06/22/1946, MRN [id number] ? ?PCP:  Tammy Organ., MD  ?Cardiologist:  Tammy Kras, DO, [facility] (established care 08/21/21) ? ?Date: 11/06/21 ?Last Office Visit: 09/18/2021 ? ?Chief Complaint  ?Patient presents with  ? Shortness of Breath  ? Results  ? Follow-up  ? ? ?HPI  ?[name] is a 76 y.o. Caucasian female whose past medical history and cardiovascular risk factors include: Carotid artery disease, mild aortic stenosis, Hypothyroidism, hypertension, hyperlipidemia asthma, anxiety/depression, ADD, former smoker.  ? ?She is referred to the office at the request of Tammy Organ., MD for evaluation of Dyspnea. ? ?This patient is accompanied in the office by her daughter,[name] . [name] provides verbal consent with regards to having her present during today's encounter. ? ?In November 2022 patient had a flu followed by pneumonia in December 2022 and since then she has been short of breath and slow to recover she was referred to the practice for evaluation of dyspnea.  Echocardiogram notes preserved LVEF, grade 1 diastolic impairment, and mild aortic stenosis (new finding) and no pulmonary hypertension. Her Pro-BNP w/n normal limits as well. Patient's blood pressures were elevated during prior office visits and has been enrolled into principal care management for ambulatory blood pressure monitoring.  Her blood pressures at home have improved on current medical therapy.  She is also undergone ischemic work-up including a stress test at last office visit which was reported to be low risk. ? ?Despite a comprehensive cardiovascular examination/evaluation no unifying diagnoses that would explain her underlying dyspnea and decreased stamina. ? ?She had a pulmonary function test in the past and follows up with Dr. Vaughan [name] from pulmonary standpoint.  But has not seen him for reevaluation in some time.  She also has nonspecific  symptoms of expiratory wheezing and has been using Symbicort as her rescue inhaler. ? ?She gets very fragmented sleep, feels tired and fatigue during the day, nocturia, and appears drowsy on clinical examination.  I suspect she may have a degree of sleep apnea which should be evaluated as it can contribute to her reduced stamina.   ? ?FUNCTIONAL STATUS: ?No structured exercise program or daily routine.  ? ?ALLERGIES: ?Allergies  ?Allergen Reactions  ? Ampicillin Nausea And Vomiting  ? Bee Pollen   ?  Other reaction(s): Other (See Comments)  ? Erythromycin Nausea And Vomiting and Other (See Comments)  ?  Can take Z pac  ? Hctz [Hydrochlorothiazide] Other (See Comments)  ?  dizzy  ? Metoprolol Other (See Comments)  ?  doublevision   ? Molds & Smuts Other (See Comments)  ?  Pt coughs and uses an inhaler.  ? Morphine And Related Nausea And Vomiting  ?  No problem with hydrocodone  ? Penicillin G Other (See Comments)  ?  Unknown ?Has patient had a PCN reaction causing immediate rash, facial/tongue/throat swelling, SOB or lightheadedness with hypotension: Unknown ?Has patient had a PCN reaction causing severe rash involving mucus membranes or skin necrosis: Unknown ?Has patient had a PCN reaction that required hospitalization: Unknown ?Has patient had a PCN reaction occurring within the last 10 years: Unknown ?If all of the above answers are "NO", then may proceed with Cephalosporin use. ?  ? ? ?MEDICATION LIST PRIOR TO VISIT: ?Current Meds  ?Medication Sig  ? ALPRAZolam (XANAX) 0.25 MG tablet Take 0.25 mg by mouth 2 (two) times daily as needed for anxiety or sleep.  ?  amLODipine-valsartan (EXFORGE) 5-160 MG tablet Take 1 tablet by mouth daily.  ? aspirin EC 81 MG tablet Take 1 tablet (81 mg total) by mouth daily. Swallow whole.  ? budesonide-formoterol (SYMBICORT) 160-4.5 MCG/ACT inhaler Inhale 2 puffs into the lungs in the morning and at bedtime.  ? buPROPion (WELLBUTRIN XL) 300 MG 24 hr tablet Take 1 tablet (300 mg  total) by mouth daily.  ? celecoxib (CELEBREX) 200 MG capsule Take 200 mg by mouth daily.  ? Cholecalciferol (VITAMIN D PO) Take 1 tablet by mouth daily.  ? CONCERTA 36 MG CR tablet Take 36 mg by mouth daily.   ? FLUoxetine (PROZAC) 20 MG capsule Take 3 capsules (60 mg total) by mouth daily.  ? HYDROcodone-acetaminophen (NORCO) 7.5-325 MG tablet Take 1-2 tablets by mouth every 4 (four) hours as needed for moderate pain.  ? inFLIXimab (REMICADE) 100 MG injection IV infusion every 2 months  ? lamoTRIgine (LAMICTAL) 100 MG tablet Take 100 mg by mouth 3 (three) times daily.   ? methocarbamol (ROBAXIN) 500 MG tablet Take 1 tablet (500 mg total) by mouth every 6 (six) hours as needed for muscle spasms.  ? pantoprazole (PROTONIX) 40 MG tablet TAKE ONE TABLET TWICE DAILY (Patient taking differently: Take 40 mg by mouth 2 (two) times daily.)  ? rosuvastatin (CRESTOR) 10 MG tablet Take 10 mg by mouth daily.   ? SYNTHROID 50 MCG tablet TAKE 2 TABLETS EVERY MORNING (Patient taking differently: Take 100 mcg by mouth daily before breakfast.)  ? VENTOLIN HFA 108 (90 Base) MCG/ACT inhaler ONE PUFF FOUR TIMES DAILY AS NEEDED (Patient taking differently: Inhale 1 puff into the lungs every 6 (six) hours as needed for wheezing or shortness of breath.)  ? [DISCONTINUED] losartan-hydrochlorothiazide (HYZAAR) 50-12.5 MG per tablet Take 1 tablet by mouth daily.  ?  ? ?PAST MEDICAL HISTORY: ?Past Medical History:  ?Diagnosis Date  ? ADD (attention deficit disorder)   ? Allergy   ? Anxiety   ? Asthma   ? Attention deficit disorder without mention of hyperactivity   ? Back pain   ? Colitis 12/05/2010  ? Colon polyps   ? Complication of anesthesia   ? problem with grogginess and a long time to get over Anesthesia  ? Depression   ? Diverticulosis   ? GERD (gastroesophageal reflux disease)   ? H/O hiatal hernia   ? Hypertension   ? IBS (irritable bowel syndrome)   ? MCI (mild cognitive impairment)   ? Memory changes   ? Osteoarthrosis,  unspecified whether generalized or localized, unspecified site   ? Other and unspecified hyperlipidemia   ? Renal cyst 10/2015  ? simple left  ? Repeated falls   ? Thyroid disease   ? Ulcerative colitis   ? Unspecified asthma(493.90)   ? Unspecified essential hypertension   ? taken off meds since lost wt  ? ? ?PAST SURGICAL HISTORY: ?Past Surgical History:  ?Procedure Laterality Date  ? ABDOMINAL EXPOSURE N/A 04/22/2014  ? Procedure: ABDOMINAL EXPOSURE;  Surgeon: Angelia Mould, MD;  Location: [facility];  Service: Vascular;  Laterality: N/A;  ? ANTERIOR LATERAL LUMBAR FUSION 4 LEVELS Left 04/22/2014  ? Procedure: Left Lumbar one/two, two/three, three/four, four,five. Anterior lateral lumbar interbody fusion**Stage 1**;  Surgeon: Erline Levine, MD;  Location: [facility];  Service: Neurosurgery;  Laterality: Left;  ? ANTERIOR LUMBAR FUSION N/A 04/22/2014  ? Procedure: Lumbar five/-Sacral one. Anterior lumbar interbody fusion with Dr. Scot Dock; Left Lumbar one/two, two/three/three/four, four/five. Anterior lateral lumbar  interbody fusion**Stage 1**;  Surgeon: Erline Levine, MD;  Location: [facility];  Service: Neurosurgery;  Laterality: N/A;  ? BACK SURGERY    ? fluid removed from between disks  ? BREAST SURGERY    ? bil breast reduction   ? CARPAL TUNNEL RELEASE    ? CHOLECYSTECTOMY    ? Laparoscopic cholecystectomy  ? COLONOSCOPY    ? HIP FRACTURE SURGERY    ? left  11/01/2013  ? LUMBAR PERCUTANEOUS PEDICLE SCREW 4 LEVEL N/A 04/25/2014  ? Procedure: **Stage 2** Percutaneous pedicle screw placement L1-5;  Surgeon: Erline Levine, MD;  Location: [facility];  Service: Neurosurgery;  Laterality: N/A;  **Stage 2** Percutaneous pedicle screw placement L1-5  ? SHOULDER SURGERY    ? right shoulder 3x   ? TOE SURGERY    ? right big toe  ? TOTAL HIP ARTHROPLASTY Right 07/07/2018  ? Procedure: TOTAL HIP ARTHROPLASTY ANTERIOR APPROACH;  Surgeon: Paralee Cancel, MD;  Location: [facility];  Service: Orthopedics;  Laterality:  Right;  70 mins  ? TOTAL SHOULDER ARTHROPLASTY    ? UPPER GASTROINTESTINAL ENDOSCOPY  09/28/2016  ? Dilation  ? ? ?FAMILY HISTORY: ?The patient family history includes Arthritis in her father; Colon cancer (age

## 2021-11-08 DIAGNOSIS — M25551 Pain in right hip: Secondary | ICD-10-CM | POA: Diagnosis not present

## 2021-11-09 ENCOUNTER — Encounter: Payer: Self-pay | Admitting: Pulmonary Disease

## 2021-11-09 ENCOUNTER — Telehealth: Payer: Self-pay | Admitting: Pharmacy Technician

## 2021-11-09 ENCOUNTER — Ambulatory Visit (INDEPENDENT_AMBULATORY_CARE_PROVIDER_SITE_OTHER): Payer: Medicare Other | Admitting: Pulmonary Disease

## 2021-11-09 VITALS — BP 130/68 | HR 74 | Temp 97.9°F | Ht 65.0 in | Wt 169.0 lb

## 2021-11-09 DIAGNOSIS — Z8669 Personal history of other diseases of the nervous system and sense organs: Secondary | ICD-10-CM

## 2021-11-09 DIAGNOSIS — R062 Wheezing: Secondary | ICD-10-CM

## 2021-11-09 DIAGNOSIS — J4540 Moderate persistent asthma, uncomplicated: Secondary | ICD-10-CM | POA: Diagnosis not present

## 2021-11-09 DIAGNOSIS — R0600 Dyspnea, unspecified: Secondary | ICD-10-CM | POA: Diagnosis not present

## 2021-11-09 DIAGNOSIS — I6523 Occlusion and stenosis of bilateral carotid arteries: Secondary | ICD-10-CM | POA: Diagnosis not present

## 2021-11-09 MED ORDER — AEROCHAMBER MV MISC: 0 refills | Status: AC

## 2021-11-09 MED ORDER — ALBUTEROL SULFATE HFA 108 (90 BASE) MCG/ACT IN AERS: 2.0000 | INHALATION_SPRAY | Freq: Four times a day (QID) | RESPIRATORY_TRACT | 3 refills | Status: AC | PRN

## 2021-11-09 NOTE — Progress Notes (Signed)
[name]    [id number]    January 01, 1946  Primary Care Physician:Shaw, Emily Filbert., MD  Referring Physician: Ginger Organ., MD [address] [place],  [place] [address]  Chief complaint: Follow-up for wheezing, asthma  HPI: 76 year old with history of hypertension, asthma, hyperlipidemia, allergies Here for evaluation of wheezing. She has a history of asthma Albuterol which she uses about 1 time per month.  She states that she is doing okay But her 2 daughters who are here today tells me that they hear her breathing all the time.  Denies any cough, sputum production, fevers, chills.  She recently lost her husband to ALS and is adjusting to life on her own.  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  No feather pillows or comforters Smoking history: Never smoker.  Exposed to secondhand smoke Travel history: Originally from [place], no significant recent travel Relevant family history: Mother had COPD.  She was a non-smoker.  Interim history: Started on Symbicort inhaler at last visit but she was confused with instructions and is not using it regularly.  Continues to have dyspnea with wheezing.  Outpatient Encounter Medications as of 11/09/2021  Medication Sig   ALPRAZolam (XANAX) 0.25 MG tablet Take 0.25 mg by mouth 2 (two) times daily as needed for anxiety or sleep.   amLODipine-valsartan (EXFORGE) 5-160 MG tablet Take 1 tablet by mouth daily.   aspirin EC 81 MG tablet Take 1 tablet (81 mg total) by mouth daily. Swallow whole.   budesonide-formoterol (SYMBICORT) 160-4.5 MCG/ACT inhaler Inhale 2 puffs into the lungs in the morning and at bedtime.   buPROPion (WELLBUTRIN XL) 300 MG 24 hr tablet Take 1 tablet (300 mg total) by mouth daily.   celecoxib (CELEBREX) 200 MG capsule Take 200 mg by mouth daily.   Cholecalciferol (VITAMIN D PO) Take 1 tablet by mouth daily.    CONCERTA 36 MG CR tablet Take 36 mg by mouth daily.    FLUoxetine (PROZAC) 20 MG capsule Take 3 capsules (60 mg total) by mouth daily. (Patient taking differently: Take 20 mg by mouth daily.)   HYDROcodone-acetaminophen (NORCO) 7.5-325 MG tablet Take 1-2 tablets by mouth every 4 (four) hours as needed for moderate pain.   inFLIXimab (REMICADE) 100 MG injection IV infusion every 2 months   lamoTRIgine (LAMICTAL) 100 MG tablet Take 100 mg by mouth 3 (three) times daily.    methocarbamol (ROBAXIN) 500 MG tablet Take 1 tablet (500 mg total) by mouth every 6 (six) hours as needed for muscle spasms.   pantoprazole (PROTONIX) 40 MG tablet TAKE ONE TABLET TWICE DAILY (Patient taking differently: Take 40 mg by mouth 2 (two) times daily.)   rosuvastatin (CRESTOR) 10 MG tablet Take 10 mg by mouth daily.    SYNTHROID 50 MCG tablet TAKE 2 TABLETS EVERY MORNING (Patient taking differently: Take 100 mcg by mouth daily before breakfast.)   VENTOLIN HFA 108 (90 Base) MCG/ACT inhaler ONE PUFF FOUR TIMES DAILY AS NEEDED (Patient taking differently: Inhale 1 puff into the lungs every 6 (six) hours as needed for wheezing or shortness of breath.)   [DISCONTINUED] losartan-hydrochlorothiazide (HYZAAR) 50-12.5 MG per tablet Take 1 tablet by mouth daily.   [DISCONTINUED] simvastatin (ZOCOR) 20 MG tablet Take 20 mg by mouth every evening.   No facility-administered encounter medications on file as of 11/09/2021.   Physical Exam: Blood pressure 130/68, pulse 74, temperature 97.9  F (36.6 C), temperature source Oral, height 5' 5"  (1.651 m), weight 169 lb (76.7 kg), SpO2 97 %. Gen:      No acute distress HEENT:  EOMI, sclera anicteric Neck:     No masses; no thyromegaly Lungs:    Clear to auscultation bilaterally; normal respiratory effort CV:         Regular rate and rhythm; no murmurs Abd:      + bowel sounds; soft, non-tender; no palpable masses, no distension Ext:    No edema; adequate peripheral perfusion Skin:       Warm and dry; no rash Neuro: alert and oriented x 3 Psych: normal mood and affect   Data Reviewed: Imaging: Chest x-ray 10/16/2020-reticulonodular opacities of the lower lungs.  I have reviewed the images personally.  PFTs: 10/24/2020 FVC 3.14 [105%], FEV1 2.26 [101%], F/F 72, TLC 5.63 [108%], DLCO 18.34 [92%] Normal test  Labs: IgE 10/16/2020- 2 CBC/25/22-WBC 7.2, eos 6.2%, absolute eosinophil count 446  Assessment:  Asthma Although there is no overt obstruction there was some improvement in flow rates consistent with asthma.  She also has elevated peripheral eosinophils She is not taking her Symbicort regularly and have instructed to use it 2 puffs twice daily on a regular basis  CT from last year showed opacities at the lower lungs and she will need evaluation for ILD.  Order high-res CT  Suspected sleep apnea Has symptoms of daytime fatigue and somnolence.  Order home sleep study.  Plan/Recommendations: Start using Symbicort regularly, albuterol as needed High-res CT and home sleep study  Marshell Garfinkel MD  Pulmonary and Critical Care 11/09/2021, 9:02 AM  CC: Ginger Organ., MD

## 2021-11-09 NOTE — Telephone Encounter (Signed)
Auth Submission: no auth needed Payer: medicare a/b / [employer] [place] Medication & CPT/J Code(s) submitted: Prolia (Denosumab) [phone] Route of submission (phone, fax, portal): portal Auth type: Buy/Bill Units/visits requested: x1 dose Reference number: 0 Approval from: 11/09/21 to 06/11/22

## 2021-11-09 NOTE — Patient Instructions (Signed)
We will order albuterol inhaler to be sent to your pharmacy. Start using the Symbicort regularly.  Use 2 puffs twice daily We will order high-res CT for evaluation of dyspnea Order home sleep study for evaluation of sleep apnea Follow-up in clinic in 4 months.  Video visit okay.

## 2021-11-12 ENCOUNTER — Other Ambulatory Visit: Payer: Self-pay | Admitting: Internal Medicine

## 2021-11-12 DIAGNOSIS — M810 Age-related osteoporosis without current pathological fracture: Secondary | ICD-10-CM

## 2021-11-12 DIAGNOSIS — M25551 Pain in right hip: Secondary | ICD-10-CM | POA: Diagnosis not present

## 2021-11-14 ENCOUNTER — Ambulatory Visit: Payer: Self-pay

## 2021-11-14 ENCOUNTER — Ambulatory Visit (INDEPENDENT_AMBULATORY_CARE_PROVIDER_SITE_OTHER): Payer: Medicare Other | Admitting: Sports Medicine

## 2021-11-14 VITALS — BP 138/57 | Ht 65.0 in | Wt 160.0 lb

## 2021-11-14 DIAGNOSIS — I6523 Occlusion and stenosis of bilateral carotid arteries: Secondary | ICD-10-CM | POA: Diagnosis not present

## 2021-11-14 DIAGNOSIS — S76011S Strain of muscle, fascia and tendon of right hip, sequela: Secondary | ICD-10-CM | POA: Diagnosis not present

## 2021-11-14 DIAGNOSIS — R1031 Right lower quadrant pain: Secondary | ICD-10-CM

## 2021-11-14 MED ORDER — NITROGLYCERIN 0.2 MG/HR TD PT24: MEDICATED_PATCH | TRANSDERMAL | 1 refills | Status: DC

## 2021-11-14 NOTE — Progress Notes (Unsigned)
PCP: Ginger Organ., MD  Subjective:   HPI: [name] is a pleasant 76 y.o. female here for chronic right groin pain.  Patient has had on and off groin pain and anterior hip pain for the last 3-4 years.  She denied any specific inciting injury or trauma that may have caused this.  She did state that about 4 years ago she had a bad fall where she smashed her left femur and had to have a intramedullary nail fixated at that time.  In terms of the right hip, she follows with Dr. Alvan Dame and did have a right THA 2 years ago which they thought would help improve her groin pain, however it has continued.  The pain is worse with going up and down.  The pain is worse when getting in and out of the car or lifting the leg up to take off her shoes.  At times when she is simply lying on her back she feels a radiating pain into the groin as well.  She denies any palpable bulge, no redness fever or chills.  This has been on and off issue for few years, however over the last few months it has been more bothersome and more consistent.  She is doing formalized physical therapy for both of her lower extremities as she has had some right greater than left lower extremity weakness.  She does feel like the right hip feels weaker but is unsure if this is due to the pain with hip flexion.  She has noted some benefit from physical therapy in the past, but back in December she had a few illnesses that set her back.  She is not taking any medication for the pain consistently, occasional Motrin or Tylenol.  Past Medical History:  Diagnosis Date   ADD (attention deficit disorder)    Allergy    Anxiety    Asthma    Attention deficit disorder without mention of hyperactivity    Back pain    Colitis 12/05/2010   Colon polyps    Complication of anesthesia    problem with grogginess and a long time to get over Anesthesia   Depression    Diverticulosis    GERD (gastroesophageal reflux disease)    H/O hiatal hernia     Hypertension    IBS (irritable bowel syndrome)    MCI (mild cognitive impairment)    Memory changes    Osteoarthrosis, unspecified whether generalized or localized, unspecified site    Other and unspecified hyperlipidemia    Renal cyst 10/2015   simple left   Repeated falls    Thyroid disease    Ulcerative colitis    Unspecified asthma(493.90)    Unspecified essential hypertension    taken off meds since lost wt    Current Outpatient Medications on File Prior to Visit  Medication Sig Dispense Refill   albuterol (VENTOLIN HFA) 108 (90 Base) MCG/ACT inhaler Inhale 2 puffs into the lungs every 6 (six) hours as needed for wheezing or shortness of breath. 18 g 3   ALPRAZolam (XANAX) 0.25 MG tablet Take 0.25 mg by mouth 2 (two) times daily as needed for anxiety or sleep.     amLODipine-valsartan (EXFORGE) 5-160 MG tablet Take 1 tablet by mouth daily. 90 tablet 1   aspirin EC 81 MG tablet Take 1 tablet (81 mg total) by mouth daily. Swallow whole. 30 tablet 11   budesonide-formoterol (SYMBICORT) 160-4.5 MCG/ACT inhaler Inhale 2 puffs into the lungs in the morning and at  bedtime. 10.2 g 5   buPROPion (WELLBUTRIN XL) 300 MG 24 hr tablet Take 1 tablet (300 mg total) by mouth daily. 90 tablet 1   celecoxib (CELEBREX) 200 MG capsule Take 200 mg by mouth daily.     Cholecalciferol (VITAMIN D PO) Take 1 tablet by mouth daily.     CONCERTA 36 MG CR tablet Take 36 mg by mouth daily.      FLUoxetine (PROZAC) 20 MG capsule Take 3 capsules (60 mg total) by mouth daily. (Patient taking differently: Take 20 mg by mouth daily.) 90 capsule 3   HYDROcodone-acetaminophen (NORCO) 7.5-325 MG tablet Take 1-2 tablets by mouth every 4 (four) hours as needed for moderate pain. 60 tablet 0   inFLIXimab (REMICADE) 100 MG injection IV infusion every 2 months     lamoTRIgine (LAMICTAL) 100 MG tablet Take 100 mg by mouth 3 (three) times daily.      methocarbamol (ROBAXIN) 500 MG tablet Take 1 tablet (500 mg total) by  mouth every 6 (six) hours as needed for muscle spasms. 40 tablet 0   pantoprazole (PROTONIX) 40 MG tablet TAKE ONE TABLET TWICE DAILY (Patient taking differently: Take 40 mg by mouth 2 (two) times daily.) 60 tablet 11   rosuvastatin (CRESTOR) 10 MG tablet Take 10 mg by mouth daily.      Spacer/Aero-Holding Chambers (AEROCHAMBER MV) inhaler Use as instructed 1 each 0   SYNTHROID 50 MCG tablet TAKE 2 TABLETS EVERY MORNING (Patient taking differently: Take 100 mcg by mouth daily before breakfast.) 180 tablet 1   [DISCONTINUED] losartan-hydrochlorothiazide (HYZAAR) 50-12.5 MG per tablet Take 1 tablet by mouth daily.     [DISCONTINUED] simvastatin (ZOCOR) 20 MG tablet Take 20 mg by mouth every evening.     No current facility-administered medications on file prior to visit.    Past Surgical History:  Procedure Laterality Date   ABDOMINAL EXPOSURE N/A 04/22/2014   Procedure: ABDOMINAL EXPOSURE;  Surgeon: Angelia Mould, MD;  Location: [facility];  Service: Vascular;  Laterality: N/A;   ANTERIOR LATERAL LUMBAR FUSION 4 LEVELS Left 04/22/2014   Procedure: Left Lumbar one/two, two/three, three/four, four,five. Anterior lateral lumbar interbody fusion**Stage 1**;  Surgeon: Erline Levine, MD;  Location: [facility];  Service: Neurosurgery;  Laterality: Left;   ANTERIOR LUMBAR FUSION N/A 04/22/2014   Procedure: Lumbar five/-Sacral one. Anterior lumbar interbody fusion with Dr. Scot Dock; Left Lumbar one/two, two/three/three/four, four/five. Anterior lateral lumbar interbody fusion**Stage 1**;  Surgeon: Erline Levine, MD;  Location: [facility];  Service: Neurosurgery;  Laterality: N/A;   BACK SURGERY     fluid removed from between disks   BREAST SURGERY     bil breast reduction    CARPAL TUNNEL RELEASE     CHOLECYSTECTOMY     Laparoscopic cholecystectomy   COLONOSCOPY     HIP FRACTURE SURGERY     left  11/01/2013   LUMBAR PERCUTANEOUS PEDICLE SCREW 4 LEVEL N/A 04/25/2014   Procedure: **Stage  2** Percutaneous pedicle screw placement L1-5;  Surgeon: Erline Levine, MD;  Location: [facility];  Service: Neurosurgery;  Laterality: N/A;  **Stage 2** Percutaneous pedicle screw placement L1-5   SHOULDER SURGERY     right shoulder 3x    TOE SURGERY     right big toe   TOTAL HIP ARTHROPLASTY Right 07/07/2018   Procedure: TOTAL HIP ARTHROPLASTY ANTERIOR APPROACH;  Surgeon: Paralee Cancel, MD;  Location: [facility];  Service: Orthopedics;  Laterality: Right;  70 mins   TOTAL SHOULDER ARTHROPLASTY  UPPER GASTROINTESTINAL ENDOSCOPY  09/28/2016   Dilation    Allergies  Allergen Reactions   Ampicillin Nausea And Vomiting   Bee Pollen     Other reaction(s): Other (See Comments)   Erythromycin Nausea And Vomiting and Other (See Comments)    Can take Z pac   Hctz [Hydrochlorothiazide] Other (See Comments)    dizzy   Metoprolol Other (See Comments)    doublevision    Molds & Smuts Other (See Comments)    Pt coughs and uses an inhaler.   Morphine And Related Nausea And Vomiting    No problem with hydrocodone   Penicillin G Other (See Comments)    Unknown Has patient had a PCN reaction causing immediate rash, facial/tongue/throat swelling, SOB or lightheadedness with hypotension: Unknown Has patient had a PCN reaction causing severe rash involving mucus membranes or skin necrosis: Unknown Has patient had a PCN reaction that required hospitalization: Unknown Has patient had a PCN reaction occurring within the last 10 years: Unknown If all of the above answers are "NO", then may proceed with Cephalosporin use.     BP (!) 138/57   Ht 5' 5"  (1.651 m)   Wt 160 lb (72.6 kg)   BMI 26.63 kg/m       View : No data to display.              View : No data to display.              Objective:  Physical Exam:  Gen: Well-appearing, in no acute distress; non-toxic CV: Regular Rate. Well-perfused. Warm.  Resp: Breathing unlabored on room air; no wheezing. Psych: Fluid speech in  conversation; appropriate affect; normal thought process Neuro: Sensation intact throughout. No gross coordination deficits.  MSK:   - Right hip: Positive TTP noted over the inferior aspect of the right ASIS.  Mild TTP over the pubic symphysis, although patient states this is not her usual pain.  No TTP over the greater trochanter.  He has some relative tenderness with deep palpation throughout the hip flexor and groin muscles.  Inspection yields no obvious erythema, ecchymosis or swelling.  Passive logroll without pain or restriction on the right.  Active range of motion about the hip is full in all directions, although pain with endrange external rotation.  Negative FABER and FADIR testing.  There is pain with resisted hip flexion. + Hacky Sack test.  Negative abductor squeeze test.  Antalgic gait.  Neurovascular intact distally.  MSK Limited hip ultrasound performed, right   -Short and long axis evaluation of the ASIS does demonstrate some cortical irregularity of the inferior aspect.  This could reflect bony spurring versus very minor sartorius avulsion, this does appear chronic.  -Evaluation of the sartorius muscle and long axis as it inserts on the ASIS does show some mild hypoechoic change and tendinopathic change near the proximal insertion.  Mild hyperemia around this area. + Sonopalpation. -Evaluation of the right AIIS demonstrates no significant cortical irregularity.  Long axis evaluation of the rectus femoris muscle appears intact without significant tendinopathic changes. -Muscle bellies of the abductor longus, brevis and magnus was identified in long and short axis without notable pathology.   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 Concerning for bony spurring vs. Mild chronic avulsion injury.    Assessment & Plan:  1. Right chronic groin pain 2. Hx of R-THA x 2 years - did not resolve  groin pain 3. 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  She has seen her hip surgeon multiple times without any relief of her groin pain.  Independent review of her hip x-ray and CT does not show any evidence of hardware malfunction.  On her ultrasound today we did show some tendinopathic changes of the sartorius muscle near the insertion of the ASIS with some mild radiation into the groin as the tendon courses medially.  I did discuss this most certainly could be the cause of her pain or a contributing factor.  Her abductor tendons look fine on ultrasound.  We will start with showing her some home exercises for the sartorius muscle and the hip flexors.  She may do this at home as well as add these to her routine physical therapy.  We will also begin Nitropatch protocol, discussed risk benefits and indications.  She responds to this over 3 weeks.  If she is having benefit, we will continue the treatment course.  If she is not receiving benefit the next step would be doing a trial of an ASIS injection to see if this does relieve her pain.  Ultimately, we could consider pelvic floor physical therapy if these treatment modalities do not improve her pain.  Elba Barman, DO PGY-4, Sports Medicine Fellow [place]  This note was dictated using Dragon naturally speaking software and may contain errors in syntax, spelling, or content which have not been identified prior to signing this note.   Addendum:  I was the preceptor for this visit and available for immediate consultation.  Karlton Lemon MD [facility]

## 2021-11-14 NOTE — Patient Instructions (Signed)
Shaely - It was great meeting you today, thank you for letting me participate in your care!  Today, we discussed your right groin pain. On the Ultrasound, we did see some evidence of one of your hip muscles (sartorius) that has some degeneration of the quality of the tendon. This may the cause or certainly a contributor of your pain.  Things we will do: - perform home exercises for the hip muscles/sartorius.  You will perform these once daily.  You may also show these to your physical therapist to see if they can help incorporate into your already planned rehab. -We will do nitroglycerin patches.  He will place this over the painful area of the hip and groin, change each patch every 24 hours.  See instructions below. -We will see how you respond and about 3 weeks, if you are seeing improvement we will continue the course.  If it is not getting better at that 3-week appointment we will consider a cortisone injection into that area.  If you have any further questions, please give the clinic a call [phone].  Cheers,  [place]   Nitroglycerin Protocol  Apply 1/4 nitroglycerin patch to affected area daily. Change position of patch within the affected area every 24 hours. You may experience a headache during the first 1-2 weeks of using the patch, these should subside. If you experience headaches after beginning nitroglycerin patch treatment, you may take your preferred over the counter pain reliever. Another side effect of the nitroglycerin patch is skin irritation or rash related to patch adhesive. Please notify our office if you develop more severe headaches or rash, and stop the patch. Tendon healing with nitroglycerin patch may require 12 to 24 weeks depending on the extent of injury. Men should not use if taking Viagra, Cialis, or Levitra.  Do not use if you have migraines or rosacea.

## 2021-11-15 ENCOUNTER — Ambulatory Visit (HOSPITAL_COMMUNITY): Admission: RE | Admit: 2021-11-15 | Payer: Medicare Other | Source: Ambulatory Visit

## 2021-11-15 DIAGNOSIS — M25551 Pain in right hip: Secondary | ICD-10-CM | POA: Diagnosis not present

## 2021-11-22 DIAGNOSIS — M25551 Pain in right hip: Secondary | ICD-10-CM | POA: Diagnosis not present

## 2021-11-23 ENCOUNTER — Other Ambulatory Visit: Payer: Self-pay | Admitting: Pulmonary Disease

## 2021-11-26 DIAGNOSIS — M25551 Pain in right hip: Secondary | ICD-10-CM | POA: Diagnosis not present

## 2021-11-28 DIAGNOSIS — M25551 Pain in right hip: Secondary | ICD-10-CM | POA: Diagnosis not present

## 2021-11-29 ENCOUNTER — Ambulatory Visit (INDEPENDENT_AMBULATORY_CARE_PROVIDER_SITE_OTHER): Payer: Medicare Other

## 2021-11-29 VITALS — BP 129/74 | HR 69 | Temp 98.1°F | Resp 16 | Ht 65.0 in | Wt 167.6 lb

## 2021-11-29 DIAGNOSIS — M810 Age-related osteoporosis without current pathological fracture: Secondary | ICD-10-CM

## 2021-11-29 MED ORDER — DENOSUMAB 60 MG/ML ~~LOC~~ SOSY
60.0000 mg | PREFILLED_SYRINGE | Freq: Once | SUBCUTANEOUS | Status: AC
  Administered 2021-11-29: 60 mg via SUBCUTANEOUS

## 2021-11-29 NOTE — Progress Notes (Addendum)
Diagnosis: Osteoporosis  Provider:  Marshell Garfinkel, MD  Procedure: Injection  Prolia (Denosumab), Dose: 60 mg, Site: subcutaneous, Number of injections: 1  Discharge: Condition: Good, Destination: Home . AVS provided to patient.   Performed by:  Paul Dykes, RN

## 2021-11-30 DIAGNOSIS — M545 Low back pain, unspecified: Secondary | ICD-10-CM | POA: Diagnosis not present

## 2021-11-30 DIAGNOSIS — M25551 Pain in right hip: Secondary | ICD-10-CM | POA: Diagnosis not present

## 2021-11-30 DIAGNOSIS — M25552 Pain in left hip: Secondary | ICD-10-CM | POA: Diagnosis not present

## 2021-12-03 DIAGNOSIS — M25551 Pain in right hip: Secondary | ICD-10-CM | POA: Diagnosis not present

## 2021-12-05 DIAGNOSIS — M25551 Pain in right hip: Secondary | ICD-10-CM | POA: Diagnosis not present

## 2021-12-10 DIAGNOSIS — F3175 Bipolar disorder, in partial remission, most recent episode depressed: Secondary | ICD-10-CM | POA: Diagnosis not present

## 2021-12-11 DIAGNOSIS — M25551 Pain in right hip: Secondary | ICD-10-CM | POA: Diagnosis not present

## 2021-12-13 DIAGNOSIS — M25551 Pain in right hip: Secondary | ICD-10-CM | POA: Diagnosis not present

## 2021-12-20 DIAGNOSIS — M25551 Pain in right hip: Secondary | ICD-10-CM | POA: Diagnosis not present

## 2021-12-24 DIAGNOSIS — M25551 Pain in right hip: Secondary | ICD-10-CM | POA: Diagnosis not present

## 2021-12-26 DIAGNOSIS — K5190 Ulcerative colitis, unspecified, without complications: Secondary | ICD-10-CM | POA: Diagnosis not present

## 2022-01-03 DIAGNOSIS — M25551 Pain in right hip: Secondary | ICD-10-CM | POA: Diagnosis not present

## 2022-01-07 DIAGNOSIS — M25551 Pain in right hip: Secondary | ICD-10-CM | POA: Diagnosis not present

## 2022-01-17 DIAGNOSIS — M25551 Pain in right hip: Secondary | ICD-10-CM | POA: Diagnosis not present

## 2022-01-21 DIAGNOSIS — M25551 Pain in right hip: Secondary | ICD-10-CM | POA: Diagnosis not present

## 2022-01-23 DIAGNOSIS — F3175 Bipolar disorder, in partial remission, most recent episode depressed: Secondary | ICD-10-CM | POA: Diagnosis not present

## 2022-01-24 DIAGNOSIS — M25551 Pain in right hip: Secondary | ICD-10-CM | POA: Diagnosis not present

## 2022-01-28 DIAGNOSIS — M25551 Pain in right hip: Secondary | ICD-10-CM | POA: Diagnosis not present

## 2022-01-31 DIAGNOSIS — F900 Attention-deficit hyperactivity disorder, predominantly inattentive type: Secondary | ICD-10-CM | POA: Diagnosis not present

## 2022-01-31 DIAGNOSIS — F3175 Bipolar disorder, in partial remission, most recent episode depressed: Secondary | ICD-10-CM | POA: Diagnosis not present

## 2022-02-04 DIAGNOSIS — M25551 Pain in right hip: Secondary | ICD-10-CM | POA: Diagnosis not present

## 2022-02-07 DIAGNOSIS — M25551 Pain in right hip: Secondary | ICD-10-CM | POA: Diagnosis not present

## 2022-02-11 DIAGNOSIS — M25551 Pain in right hip: Secondary | ICD-10-CM | POA: Diagnosis not present

## 2022-02-14 ENCOUNTER — Telehealth: Payer: Self-pay | Admitting: Pulmonary Disease

## 2022-02-14 DIAGNOSIS — M25551 Pain in right hip: Secondary | ICD-10-CM | POA: Diagnosis not present

## 2022-02-14 NOTE — Telephone Encounter (Signed)
Patient called and canceled home sleep study. She states that she does not want to have it done.   Please advise sir

## 2022-02-18 DIAGNOSIS — M7061 Trochanteric bursitis, right hip: Secondary | ICD-10-CM | POA: Diagnosis not present

## 2022-02-20 DIAGNOSIS — K5190 Ulcerative colitis, unspecified, without complications: Secondary | ICD-10-CM | POA: Diagnosis not present

## 2022-02-21 DIAGNOSIS — M25551 Pain in right hip: Secondary | ICD-10-CM | POA: Diagnosis not present

## 2022-03-13 ENCOUNTER — Telehealth (INDEPENDENT_AMBULATORY_CARE_PROVIDER_SITE_OTHER): Payer: Medicare Other | Admitting: Pulmonary Disease

## 2022-03-13 DIAGNOSIS — R0600 Dyspnea, unspecified: Secondary | ICD-10-CM | POA: Diagnosis not present

## 2022-03-13 DIAGNOSIS — I6523 Occlusion and stenosis of bilateral carotid arteries: Secondary | ICD-10-CM

## 2022-03-13 DIAGNOSIS — J4540 Moderate persistent asthma, uncomplicated: Secondary | ICD-10-CM

## 2022-03-13 NOTE — Progress Notes (Unsigned)
[name]    [id number]    1946/03/27  Primary Care Physician:Shaw, Emily Filbert., MD  Referring Physician: Ginger Organ., MD [address] [place],  [place] [address]  Chief complaint: Follow-up for wheezing, asthma  HPI: 76 y.o. with history of hypertension, asthma, hyperlipidemia, allergies Here for evaluation of wheezing. She has a history of asthma Albuterol which she uses about 1 time per month.  She states that she is doing okay But her 2 daughters who are here today tells me that they hear her breathing all the time.  Denies any cough, sputum production, fevers, chills.  She recently lost her husband to ALS and is adjusting to life on her own.  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  No feather pillows or comforters Smoking history: Never smoker.  Exposed to secondhand smoke Travel history: Originally from [place], no significant recent travel Relevant family history: Mother had COPD.  She was a non-smoker.  Interim history: Started on Symbicort inhaler at last visit but she was confused with instructions and is not using it regularly.  Continues to have dyspnea with wheezing.  Outpatient Encounter Medications as of 03/13/2022  Medication Sig   albuterol (VENTOLIN HFA) 108 (90 Base) MCG/ACT inhaler Inhale 2 puffs into the lungs every 6 (six) hours as needed for wheezing or shortness of breath.   ALPRAZolam (XANAX) 0.25 MG tablet Take 0.25 mg by mouth 2 (two) times daily as needed for anxiety or sleep.   amLODipine-valsartan (EXFORGE) 5-160 MG tablet Take 1 tablet by mouth daily.   aspirin EC 81 MG tablet Take 1 tablet (81 mg total) by mouth daily. Swallow whole.   buPROPion (WELLBUTRIN XL) 300 MG 24 hr tablet Take 1 tablet (300 mg total) by mouth daily.   celecoxib (CELEBREX) 200 MG capsule Take 200 mg by mouth daily.   Cholecalciferol (VITAMIN D PO) Take 1  tablet by mouth daily.   CONCERTA 36 MG CR tablet Take 36 mg by mouth daily.    FLUoxetine (PROZAC) 20 MG capsule Take 3 capsules (60 mg total) by mouth daily. (Patient taking differently: Take 20 mg by mouth daily.)   HYDROcodone-acetaminophen (NORCO) 7.5-325 MG tablet Take 1-2 tablets by mouth every 4 (four) hours as needed for moderate pain.   inFLIXimab (REMICADE) 100 MG injection IV infusion every 2 months   lamoTRIgine (LAMICTAL) 100 MG tablet Take 100 mg by mouth 3 (three) times daily.    methocarbamol (ROBAXIN) 500 MG tablet Take 1 tablet (500 mg total) by mouth every 6 (six) hours as needed for muscle spasms.   nitroGLYCERIN (NITRODUR - DOSED IN MG/24 HR) 0.2 mg/hr patch Use 1/4 patch daily to the affected area.   pantoprazole (PROTONIX) 40 MG tablet TAKE ONE TABLET TWICE DAILY (Patient taking differently: Take 40 mg by mouth 2 (two) times daily.)   rosuvastatin (CRESTOR) 10 MG tablet Take 10 mg by mouth daily.    Spacer/Aero-Holding Chambers (AEROCHAMBER MV) inhaler Use as instructed   SYMBICORT 160-4.5 MCG/ACT inhaler INHALE 2 PUFFS INTO THE LUNGS IN THE MORNING AND AT BEDTIME   SYNTHROID 50 MCG tablet TAKE 2 TABLETS EVERY MORNING (Patient taking differently: Take 100 mcg by mouth daily before breakfast.)   [DISCONTINUED] losartan-hydrochlorothiazide (HYZAAR) 50-12.5 MG per tablet Take 1 tablet by mouth daily.   [DISCONTINUED] simvastatin (ZOCOR) 20 MG tablet Take 20 mg by mouth every evening.  No facility-administered encounter medications on file as of 03/13/2022.   Physical Exam: Blood pressure 130/68, pulse 74, temperature 97.9 F (36.6 C), temperature source Oral, height 5' 5"  (1.651 m), weight 169 lb (76.7 kg), SpO2 97 %. Gen:      No acute distress HEENT:  EOMI, sclera anicteric Neck:     No masses; no thyromegaly Lungs:    Clear to auscultation bilaterally; normal respiratory effort CV:         Regular rate and rhythm; no murmurs Abd:      + bowel sounds; soft,  non-tender; no palpable masses, no distension Ext:    No edema; adequate peripheral perfusion Skin:      Warm and dry; no rash Neuro: alert and oriented x 3 Psych: normal mood and affect   Data Reviewed: Imaging: Chest x-ray 10/16/2020-reticulonodular opacities of the lower lungs.  I have reviewed the images personally.  PFTs: 10/24/2020 FVC 3.14 [105%], FEV1 2.26 [101%], F/F 72, TLC 5.63 [108%], DLCO 18.34 [92%] Normal test  Labs: IgE 10/16/2020- 2 CBC/25/22-WBC 7.2, eos 6.2%, absolute eosinophil count 446  Assessment:  Asthma Although there is no overt obstruction there was some improvement in flow rates consistent with asthma.  She also has elevated peripheral eosinophils She is not taking her Symbicort regularly and have instructed to use it 2 puffs twice daily on a regular basis  CT from last year showed opacities at the lower lungs and she will need evaluation for ILD.  Order high-res CT  Suspected sleep apnea Has symptoms of daytime fatigue and somnolence.  Order home sleep study.  Plan/Recommendations: Start using Symbicort regularly, albuterol as needed High-res CT and home sleep study  Marshell Garfinkel MD [facility] 03/13/2022, 10:06 AM  CC: Ginger Organ., MD

## 2022-03-14 ENCOUNTER — Encounter: Payer: Self-pay | Admitting: Pulmonary Disease

## 2022-03-14 NOTE — Patient Instructions (Signed)
Continue the Symbicort as prescribed Will order high-resolution CT for evaluation of the lungs Follow-up in clinic in 6 months.

## 2022-03-14 NOTE — Addendum Note (Signed)
Addended by: Elton Sin on: 03/14/2022 03:29 PM   Modules accepted: Orders

## 2022-03-24 DIAGNOSIS — H6123 Impacted cerumen, bilateral: Secondary | ICD-10-CM | POA: Diagnosis not present

## 2022-03-27 DIAGNOSIS — F3175 Bipolar disorder, in partial remission, most recent episode depressed: Secondary | ICD-10-CM | POA: Diagnosis not present

## 2022-03-29 DIAGNOSIS — I10 Essential (primary) hypertension: Secondary | ICD-10-CM | POA: Diagnosis not present

## 2022-04-09 DIAGNOSIS — L570 Actinic keratosis: Secondary | ICD-10-CM | POA: Diagnosis not present

## 2022-04-09 DIAGNOSIS — L821 Other seborrheic keratosis: Secondary | ICD-10-CM | POA: Diagnosis not present

## 2022-04-09 DIAGNOSIS — D225 Melanocytic nevi of trunk: Secondary | ICD-10-CM | POA: Diagnosis not present

## 2022-04-09 DIAGNOSIS — D1801 Hemangioma of skin and subcutaneous tissue: Secondary | ICD-10-CM | POA: Diagnosis not present

## 2022-04-09 DIAGNOSIS — L814 Other melanin hyperpigmentation: Secondary | ICD-10-CM | POA: Diagnosis not present

## 2022-04-09 DIAGNOSIS — D692 Other nonthrombocytopenic purpura: Secondary | ICD-10-CM | POA: Diagnosis not present

## 2022-04-12 ENCOUNTER — Inpatient Hospital Stay: Admission: RE | Admit: 2022-04-12 | Payer: Medicare Other | Source: Ambulatory Visit

## 2022-04-17 DIAGNOSIS — K5190 Ulcerative colitis, unspecified, without complications: Secondary | ICD-10-CM | POA: Diagnosis not present

## 2022-04-29 DIAGNOSIS — I10 Essential (primary) hypertension: Secondary | ICD-10-CM | POA: Diagnosis not present

## 2022-05-02 DIAGNOSIS — F900 Attention-deficit hyperactivity disorder, predominantly inattentive type: Secondary | ICD-10-CM | POA: Diagnosis not present

## 2022-05-02 DIAGNOSIS — F3175 Bipolar disorder, in partial remission, most recent episode depressed: Secondary | ICD-10-CM | POA: Diagnosis not present

## 2022-05-09 ENCOUNTER — Ambulatory Visit: Payer: No Typology Code available for payment source | Admitting: Cardiology

## 2022-05-31 ENCOUNTER — Ambulatory Visit: Payer: Medicare Other

## 2022-05-31 MED ORDER — DENOSUMAB 60 MG/ML ~~LOC~~ SOSY: 60.0000 mg | PREFILLED_SYRINGE | Freq: Once | SUBCUTANEOUS | Status: DC

## 2022-06-12 DIAGNOSIS — K5190 Ulcerative colitis, unspecified, without complications: Secondary | ICD-10-CM | POA: Diagnosis not present

## 2022-06-12 DIAGNOSIS — Z1159 Encounter for screening for other viral diseases: Secondary | ICD-10-CM | POA: Diagnosis not present

## 2022-06-29 DIAGNOSIS — I10 Essential (primary) hypertension: Secondary | ICD-10-CM | POA: Diagnosis not present

## 2022-07-10 DIAGNOSIS — E039 Hypothyroidism, unspecified: Secondary | ICD-10-CM | POA: Diagnosis not present

## 2022-07-10 DIAGNOSIS — K5190 Ulcerative colitis, unspecified, without complications: Secondary | ICD-10-CM | POA: Diagnosis not present

## 2022-07-10 DIAGNOSIS — I700 Atherosclerosis of aorta: Secondary | ICD-10-CM | POA: Diagnosis not present

## 2022-07-10 DIAGNOSIS — M170 Bilateral primary osteoarthritis of knee: Secondary | ICD-10-CM | POA: Diagnosis not present

## 2022-07-10 DIAGNOSIS — J45909 Unspecified asthma, uncomplicated: Secondary | ICD-10-CM | POA: Diagnosis not present

## 2022-07-10 DIAGNOSIS — M810 Age-related osteoporosis without current pathological fracture: Secondary | ICD-10-CM | POA: Diagnosis not present

## 2022-07-10 DIAGNOSIS — D849 Immunodeficiency, unspecified: Secondary | ICD-10-CM | POA: Diagnosis not present

## 2022-07-10 DIAGNOSIS — I10 Essential (primary) hypertension: Secondary | ICD-10-CM | POA: Diagnosis not present

## 2022-07-10 DIAGNOSIS — E785 Hyperlipidemia, unspecified: Secondary | ICD-10-CM | POA: Diagnosis not present

## 2022-07-10 DIAGNOSIS — I6523 Occlusion and stenosis of bilateral carotid arteries: Secondary | ICD-10-CM | POA: Diagnosis not present

## 2022-07-10 DIAGNOSIS — R2681 Unsteadiness on feet: Secondary | ICD-10-CM | POA: Diagnosis not present

## 2022-07-30 DIAGNOSIS — I10 Essential (primary) hypertension: Secondary | ICD-10-CM | POA: Diagnosis not present

## 2022-08-07 DIAGNOSIS — M1990 Unspecified osteoarthritis, unspecified site: Secondary | ICD-10-CM | POA: Diagnosis not present

## 2022-08-07 DIAGNOSIS — K5190 Ulcerative colitis, unspecified, without complications: Secondary | ICD-10-CM | POA: Diagnosis not present

## 2022-08-07 DIAGNOSIS — M25569 Pain in unspecified knee: Secondary | ICD-10-CM | POA: Diagnosis not present

## 2022-08-07 DIAGNOSIS — M79643 Pain in unspecified hand: Secondary | ICD-10-CM | POA: Diagnosis not present

## 2022-08-07 DIAGNOSIS — M0760 Enteropathic arthropathies, unspecified site: Secondary | ICD-10-CM | POA: Diagnosis not present

## 2022-08-07 DIAGNOSIS — Z79899 Other long term (current) drug therapy: Secondary | ICD-10-CM | POA: Diagnosis not present

## 2022-08-29 DIAGNOSIS — I10 Essential (primary) hypertension: Secondary | ICD-10-CM | POA: Diagnosis not present

## 2022-09-19 ENCOUNTER — Telehealth: Payer: Self-pay | Admitting: Pulmonary Disease

## 2022-09-19 NOTE — Telephone Encounter (Signed)
ATC X1 LVM for patient to call the office back 

## 2022-09-28 DIAGNOSIS — I10 Essential (primary) hypertension: Secondary | ICD-10-CM | POA: Diagnosis not present

## 2022-09-30 NOTE — Telephone Encounter (Signed)
Reviewed patient's chart and medication list. It does not look like the patient has ever been prescribed montelukast from our office before. I called patient but she did not answer. Left message for her to call us back.

## 2022-10-08 ENCOUNTER — Other Ambulatory Visit: Payer: No Typology Code available for payment source

## 2022-10-10 DIAGNOSIS — K5190 Ulcerative colitis, unspecified, without complications: Secondary | ICD-10-CM | POA: Diagnosis not present

## 2022-10-10 NOTE — Telephone Encounter (Signed)
Attempted to call pt but unable to reach. Left message to return call.  Due to multiple attempts trying to reach pt and unable to do so, per protocol encounter will be closed.

## 2022-10-17 DIAGNOSIS — F900 Attention-deficit hyperactivity disorder, predominantly inattentive type: Secondary | ICD-10-CM | POA: Diagnosis not present

## 2022-10-17 DIAGNOSIS — F319 Bipolar disorder, unspecified: Secondary | ICD-10-CM | POA: Diagnosis not present

## 2022-10-18 ENCOUNTER — Ambulatory Visit (INDEPENDENT_AMBULATORY_CARE_PROVIDER_SITE_OTHER): Payer: Medicare Other | Admitting: Pulmonary Disease

## 2022-10-18 ENCOUNTER — Encounter: Payer: Self-pay | Admitting: Pulmonary Disease

## 2022-10-18 VITALS — BP 126/68 | HR 64 | Temp 98.1°F | Ht 65.0 in | Wt 166.2 lb

## 2022-10-18 DIAGNOSIS — R0600 Dyspnea, unspecified: Secondary | ICD-10-CM

## 2022-10-18 DIAGNOSIS — J4540 Moderate persistent asthma, uncomplicated: Secondary | ICD-10-CM | POA: Diagnosis not present

## 2022-10-18 NOTE — Patient Instructions (Signed)
Will get a high-resolution CT scan at next available since it got canceled last on the order Will give a letter to your insurance saying that we wanted back on brand-name Symbicort as you are not tolerating the generic well Will also send in a new prescription for Symbicort 160 Schedule PFTs in 6 months Return to clinic in 6 months

## 2022-10-18 NOTE — Progress Notes (Signed)
[name]    [id number]    [birth year]  Primary Care Physician:Shaw, Netta Corrigan., MD  Referring Physician: Cleatis Polka., MD [address] [place],  [place] [address]   Chief complaint: Follow-up for wheezing, asthma  HPI: 77 y.o. with history of hypertension, asthma, hyperlipidemia, allergies Here for evaluation of wheezing. She has a history of asthma Albuterol which she uses about 1 time per month.  She states that she is doing okay But her 2 daughters who are here today tells me that they hear her breathing all the time.  Denies any cough, sputum production, fevers, chills.  She recently lost her husband to ALS and is adjusting to life on her own.  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  No feather pillows or comforters Smoking history: Never smoker.  Exposed to secondhand smoke Travel history: Originally from [place], no significant recent travel Relevant family history: Mother had COPD.  She was a non-smoker.  Interim history: Her brand-name Symbicort has been changed to generic and she does not like the new inhaler and wants to go back on her previous 1 Complains of increasing dyspnea over the past week.  She is on a diet but has gained 15 pounds  Outpatient Encounter Medications as of 10/18/2022  Medication Sig   albuterol (VENTOLIN HFA) 108 (90 Base) MCG/ACT inhaler Inhale 2 puffs into the lungs every 6 (six) hours as needed for wheezing or shortness of breath.   ALPRAZolam (XANAX) 0.25 MG tablet Take 0.25 mg by mouth 2 (two) times daily as needed for anxiety or sleep.   amLODipine-valsartan (EXFORGE) 5-160 MG tablet Take 1 tablet by mouth daily.   aspirin EC 81 MG tablet Take 1 tablet (81 mg total) by mouth daily. Swallow whole.   buPROPion (WELLBUTRIN XL) 300 MG 24 hr tablet Take 1 tablet (300 mg total) by mouth daily.   celecoxib (CELEBREX) 200 MG  capsule Take 200 mg by mouth daily.   Cholecalciferol (VITAMIN D PO) Take 1 tablet by mouth daily.   CONCERTA 36 MG CR tablet Take 36 mg by mouth daily.    FLUoxetine (PROZAC) 20 MG capsule Take 3 capsules (60 mg total) by mouth daily. (Patient taking differently: Take 20 mg by mouth daily.)   HYDROcodone-acetaminophen (NORCO) 7.5-325 MG tablet Take 1-2 tablets by mouth every 4 (four) hours as needed for moderate pain.   inFLIXimab (REMICADE) 100 MG injection IV infusion every 2 months   lamoTRIgine (LAMICTAL) 100 MG tablet Take 100 mg by mouth 3 (three) times daily.    methocarbamol (ROBAXIN) 500 MG tablet Take 1 tablet (500 mg total) by mouth every 6 (six) hours as needed for muscle spasms.   pantoprazole (PROTONIX) 40 MG tablet TAKE ONE TABLET TWICE DAILY (Patient taking differently: Take 40 mg by mouth 2 (two) times daily.)   rosuvastatin (CRESTOR) 10 MG tablet Take 10 mg by mouth daily.    Spacer/Aero-Holding Chambers (AEROCHAMBER MV) inhaler Use as instructed   SYMBICORT 160-4.5 MCG/ACT inhaler INHALE 2 PUFFS INTO THE LUNGS IN THE MORNING AND AT BEDTIME   SYNTHROID 50 MCG tablet TAKE 2 TABLETS EVERY MORNING (Patient taking differently: Take 100 mcg by mouth daily before breakfast.)   nitroGLYCERIN (NITRODUR - DOSED IN MG/24 HR) 0.2 mg/hr patch Use 1/4 patch daily to the affected area. (Patient not taking: Reported on 10/18/2022)   [DISCONTINUED] losartan-hydrochlorothiazide (HYZAAR) 50-12.5  MG per tablet Take 1 tablet by mouth daily.   [DISCONTINUED] simvastatin (ZOCOR) 20 MG tablet Take 20 mg by mouth every evening.   Facility-Administered Encounter Medications as of 10/18/2022  Medication   denosumab (PROLIA) injection 60 mg   Physical Exam: Blood pressure 126/68, pulse 64, temperature 98.1 F (36.7 C), temperature source Oral, height 5\' 5"  (1.651 m), weight 166 lb 3.2 oz (75.4 kg), SpO2 99 %. Gen:      No acute distress HEENT:  EOMI, sclera anicteric Neck:     No masses; no  thyromegaly Lungs:    Clear to auscultation bilaterally; normal respiratory effort CV:         Regular rate and rhythm; no murmurs Abd:      + bowel sounds; soft, non-tender; no palpable masses, no distension Ext:    No edema; adequate peripheral perfusion Skin:      Warm and dry; no rash Neuro: alert and oriented x 3 Psych: normal mood and affect   Data Reviewed: Imaging: Chest x-ray 10/16/2020-reticulonodular opacities of the lower lungs.     PFTs: 10/24/2020 FVC 3.14 [105%], FEV1 2.26 [101%], F/F 72, TLC 5.63 [108%], DLCO 18.34 [92%] Normal test  Labs: IgE 10/16/2020- 2 CBC/25/22-WBC 7.2, eos 6.2%, absolute eosinophil count 446  Assessment:  Asthma Although there is no overt obstruction there was some improvement in flow rates consistent with asthma.  She also has elevated peripheral eosinophils Continue Symbicort  CT from last year showed opacities at the lower lungs and she will need evaluation for ILD.  We had ordered a high-resolution CT but she was did not show for 2 appointments.  I discussed that with her today and will reschedule it  Try to get her back on brand-name Symbicort with a letter to insurance company  Suspected sleep apnea Home sleep study ordered at last visit.  Patient canceled saying she does not want evaluation for sleep apnea  Plan/Recommendations: Switch back to Symbicort Order follow-up high-resolution CT PFTs and follow-up in 6 months   Chilton Greathouse MD [facility] 10/18/2022, 10:47 AM  CC: Cleatis Polka., MD         Chilton Greathouse, MD

## 2022-10-18 NOTE — Addendum Note (Signed)
Addended by: Jacquiline Doe on: 10/18/2022 11:09 AM   Modules accepted: Orders

## 2022-10-21 ENCOUNTER — Encounter: Payer: Self-pay | Admitting: *Deleted

## 2022-10-21 ENCOUNTER — Telehealth: Payer: Self-pay | Admitting: Pulmonary Disease

## 2022-10-21 NOTE — Telephone Encounter (Signed)
Called patient insurance [employer] [place] [phone] and spoke with [place].  I requested fax number to send letter requesting patient be covered for Symbicort name brand, because insurance will only cover generic and patient is not tolerating.   Lelon Mast stated best fax number is [phone].  Symbicort request letter with copy of insurance cards faxed and confirmation received.

## 2022-10-22 DIAGNOSIS — F3175 Bipolar disorder, in partial remission, most recent episode depressed: Secondary | ICD-10-CM | POA: Diagnosis not present

## 2022-10-28 DIAGNOSIS — I10 Essential (primary) hypertension: Secondary | ICD-10-CM | POA: Diagnosis not present

## 2022-11-20 DIAGNOSIS — M810 Age-related osteoporosis without current pathological fracture: Secondary | ICD-10-CM | POA: Diagnosis not present

## 2022-11-20 DIAGNOSIS — E785 Hyperlipidemia, unspecified: Secondary | ICD-10-CM | POA: Diagnosis not present

## 2022-11-20 DIAGNOSIS — E039 Hypothyroidism, unspecified: Secondary | ICD-10-CM | POA: Diagnosis not present

## 2022-11-20 DIAGNOSIS — D649 Anemia, unspecified: Secondary | ICD-10-CM | POA: Diagnosis not present

## 2022-11-20 DIAGNOSIS — I10 Essential (primary) hypertension: Secondary | ICD-10-CM | POA: Diagnosis not present

## 2022-11-21 ENCOUNTER — Encounter: Payer: Self-pay | Admitting: Pulmonary Disease

## 2022-11-21 ENCOUNTER — Ambulatory Visit: Payer: Medicare Other

## 2022-11-21 DIAGNOSIS — I6523 Occlusion and stenosis of bilateral carotid arteries: Secondary | ICD-10-CM

## 2022-11-22 ENCOUNTER — Ambulatory Visit
Admission: RE | Admit: 2022-11-22 | Discharge: 2022-11-22 | Disposition: A | Discharge status: Home / Self Care | Payer: Medicare Other | Source: Ambulatory Visit | Attending: Pulmonary Disease | Admitting: Pulmonary Disease

## 2022-11-22 DIAGNOSIS — R0600 Dyspnea, unspecified: Secondary | ICD-10-CM | POA: Diagnosis not present

## 2022-11-22 DIAGNOSIS — I2510 Atherosclerotic heart disease of native coronary artery without angina pectoris: Secondary | ICD-10-CM | POA: Diagnosis not present

## 2022-11-22 DIAGNOSIS — J4540 Moderate persistent asthma, uncomplicated: Secondary | ICD-10-CM

## 2022-11-22 DIAGNOSIS — I700 Atherosclerosis of aorta: Secondary | ICD-10-CM | POA: Diagnosis not present

## 2022-11-26 NOTE — Progress Notes (Signed)
Called patient no answer.

## 2022-11-27 DIAGNOSIS — I10 Essential (primary) hypertension: Secondary | ICD-10-CM | POA: Diagnosis not present

## 2022-11-28 DIAGNOSIS — F3175 Bipolar disorder, in partial remission, most recent episode depressed: Secondary | ICD-10-CM | POA: Diagnosis not present

## 2022-12-03 ENCOUNTER — Encounter: Payer: Self-pay | Admitting: Cardiology

## 2022-12-03 ENCOUNTER — Ambulatory Visit: Payer: Medicare Other | Admitting: Cardiology

## 2022-12-03 VITALS — BP 121/72 | HR 70 | Resp 16 | Ht 65.0 in | Wt 150.6 lb

## 2022-12-03 DIAGNOSIS — R0602 Shortness of breath: Secondary | ICD-10-CM

## 2022-12-03 DIAGNOSIS — I10 Essential (primary) hypertension: Secondary | ICD-10-CM | POA: Diagnosis not present

## 2022-12-03 DIAGNOSIS — I6523 Occlusion and stenosis of bilateral carotid arteries: Secondary | ICD-10-CM

## 2022-12-03 NOTE — Progress Notes (Signed)
Patient came in for an office visit.   After the medical assistants had performed EKG and vital signs she is waiting for the provider. For reasons unknown/miscommunication she may have thought that the office visit is over and she left without being seen.  No charge submitted for the office visit  Will reschedule her visit.  Tessa Lerner, [place], [facility]  Pager:  [phone] Office: [phone]

## 2023-01-16 DIAGNOSIS — F319 Bipolar disorder, unspecified: Secondary | ICD-10-CM | POA: Diagnosis not present

## 2023-01-16 DIAGNOSIS — F900 Attention-deficit hyperactivity disorder, predominantly inattentive type: Secondary | ICD-10-CM | POA: Diagnosis not present

## 2023-01-20 ENCOUNTER — Ambulatory Visit: Payer: Medicare Other | Admitting: Cardiology

## 2023-01-20 DIAGNOSIS — X58XXXA Exposure to other specified factors, initial encounter: Secondary | ICD-10-CM | POA: Diagnosis not present

## 2023-01-20 DIAGNOSIS — S60511A Abrasion of right hand, initial encounter: Secondary | ICD-10-CM | POA: Diagnosis not present

## 2023-01-24 DIAGNOSIS — F3342 Major depressive disorder, recurrent, in full remission: Secondary | ICD-10-CM | POA: Diagnosis not present

## 2023-01-24 DIAGNOSIS — E039 Hypothyroidism, unspecified: Secondary | ICD-10-CM | POA: Diagnosis not present

## 2023-01-24 DIAGNOSIS — Z0000 Encounter for general adult medical examination without abnormal findings: Secondary | ICD-10-CM | POA: Diagnosis not present

## 2023-01-24 DIAGNOSIS — G3184 Mild cognitive impairment, so stated: Secondary | ICD-10-CM | POA: Diagnosis not present

## 2023-01-24 DIAGNOSIS — R7301 Impaired fasting glucose: Secondary | ICD-10-CM | POA: Diagnosis not present

## 2023-01-24 DIAGNOSIS — Z1339 Encounter for screening examination for other mental health and behavioral disorders: Secondary | ICD-10-CM | POA: Diagnosis not present

## 2023-01-24 DIAGNOSIS — K5190 Ulcerative colitis, unspecified, without complications: Secondary | ICD-10-CM | POA: Diagnosis not present

## 2023-01-24 DIAGNOSIS — R82998 Other abnormal findings in urine: Secondary | ICD-10-CM | POA: Diagnosis not present

## 2023-01-24 DIAGNOSIS — I10 Essential (primary) hypertension: Secondary | ICD-10-CM | POA: Diagnosis not present

## 2023-01-24 DIAGNOSIS — N1831 Chronic kidney disease, stage 3a: Secondary | ICD-10-CM | POA: Diagnosis not present

## 2023-01-24 DIAGNOSIS — E785 Hyperlipidemia, unspecified: Secondary | ICD-10-CM | POA: Diagnosis not present

## 2023-01-24 DIAGNOSIS — Z1331 Encounter for screening for depression: Secondary | ICD-10-CM | POA: Diagnosis not present

## 2023-01-24 DIAGNOSIS — D849 Immunodeficiency, unspecified: Secondary | ICD-10-CM | POA: Diagnosis not present

## 2023-01-24 DIAGNOSIS — M810 Age-related osteoporosis without current pathological fracture: Secondary | ICD-10-CM | POA: Diagnosis not present

## 2023-01-24 DIAGNOSIS — I700 Atherosclerosis of aorta: Secondary | ICD-10-CM | POA: Diagnosis not present

## 2023-01-24 DIAGNOSIS — I129 Hypertensive chronic kidney disease with stage 1 through stage 4 chronic kidney disease, or unspecified chronic kidney disease: Secondary | ICD-10-CM | POA: Diagnosis not present

## 2023-01-28 ENCOUNTER — Other Ambulatory Visit: Payer: Self-pay | Admitting: Internal Medicine

## 2023-01-28 ENCOUNTER — Ambulatory Visit: Payer: Medicare Other | Admitting: Cardiology

## 2023-01-28 DIAGNOSIS — Z1231 Encounter for screening mammogram for malignant neoplasm of breast: Secondary | ICD-10-CM

## 2023-02-03 ENCOUNTER — Other Ambulatory Visit: Payer: Self-pay | Admitting: Internal Medicine

## 2023-02-03 DIAGNOSIS — M810 Age-related osteoporosis without current pathological fracture: Secondary | ICD-10-CM

## 2023-02-07 DIAGNOSIS — X58XXXD Exposure to other specified factors, subsequent encounter: Secondary | ICD-10-CM | POA: Diagnosis not present

## 2023-02-07 DIAGNOSIS — S60511D Abrasion of right hand, subsequent encounter: Secondary | ICD-10-CM | POA: Diagnosis not present

## 2023-02-25 ENCOUNTER — Ambulatory Visit: Payer: Medicare Other | Admitting: Cardiology

## 2023-02-25 ENCOUNTER — Encounter: Payer: Self-pay | Admitting: Cardiology

## 2023-02-25 VITALS — BP 129/70 | HR 52 | Resp 16 | Ht 65.0 in | Wt 150.0 lb

## 2023-02-25 DIAGNOSIS — I2510 Atherosclerotic heart disease of native coronary artery without angina pectoris: Secondary | ICD-10-CM | POA: Diagnosis not present

## 2023-02-25 DIAGNOSIS — R0602 Shortness of breath: Secondary | ICD-10-CM

## 2023-02-25 DIAGNOSIS — I6523 Occlusion and stenosis of bilateral carotid arteries: Secondary | ICD-10-CM | POA: Diagnosis not present

## 2023-02-25 DIAGNOSIS — I700 Atherosclerosis of aorta: Secondary | ICD-10-CM | POA: Diagnosis not present

## 2023-02-25 DIAGNOSIS — I10 Essential (primary) hypertension: Secondary | ICD-10-CM

## 2023-02-25 DIAGNOSIS — E782 Mixed hyperlipidemia: Secondary | ICD-10-CM

## 2023-02-25 DIAGNOSIS — I359 Nonrheumatic aortic valve disorder, unspecified: Secondary | ICD-10-CM | POA: Diagnosis not present

## 2023-02-25 MED ORDER — NEXLIZET 180-10 MG PO TABS
1.0000 | ORAL_TABLET | Freq: Every day | ORAL | 0 refills | Status: DC
Start: 2023-02-25 — End: 2023-06-24

## 2023-02-25 NOTE — Progress Notes (Signed)
Date:  02/25/2023   ID:  [name], DOB Sep 21, 1945, MRN [id number]  PCP:  Cleatis Polka., MD  Cardiologist:  Tessa Lerner, DO, [facility] (established care 08/21/21)  Date: 02/25/23 Last Office Visit: 11/06/2021  Chief Complaint  Patient presents with   Shortness of Breath    HPI  [name] is a 77 y.o. Caucasian female whose past medical history and cardiovascular risk factors include: Coronary artery calcification, aortic atherosclerosis, carotid artery disease, mild aortic stenosis, Hypothyroidism, hypertension, hyperlipidemia asthma, anxiety/depression, ADD, former smoker.   Patient was referred to the practice for evaluation of dyspnea back in 2023.  She has undergone appropriate cardiovascular workup as outlined below.  I am seeing her after 1 year for follow-up.  She denies any anginal chest pain or heart failure symptoms.  Her dyspnea is chronic and stable.  Patient states that she did follow-up with pulmonary medicine and was diagnosed with asthma and is currently on medical therapy.  She also had a CT of the chest based on EMR which noted aortic atherosclerosis as well as severe coronary artery calcification.  Overall functional capacity has not changed since last office visit.  Recent carotid duplex results reviewed with the patient as well.  Most recent lipid profile from [facility] noted below for further reference.   FUNCTIONAL STATUS: No structured exercise program or daily routine.   ALLERGIES: Allergies  Allergen Reactions   Ampicillin Nausea And Vomiting   Bee Pollen     Other reaction(s): Other (See Comments)   Erythromycin Nausea And Vomiting and Other (See Comments)    Can take Z pac   Hctz [Hydrochlorothiazide] Other (See Comments)    dizzy   Metoprolol Other (See Comments)    doublevision    Molds & Smuts Other (See Comments)    Pt coughs and uses an inhaler.   Morphine And Codeine Nausea And Vomiting    No  problem with hydrocodone   Penicillin G Other (See Comments)    Unknown Has patient had a PCN reaction causing immediate rash, facial/tongue/throat swelling, SOB or lightheadedness with hypotension: Unknown Has patient had a PCN reaction causing severe rash involving mucus membranes or skin necrosis: Unknown Has patient had a PCN reaction that required hospitalization: Unknown Has patient had a PCN reaction occurring within the last 10 years: Unknown If all of the above answers are "NO", then may proceed with Cephalosporin use.     MEDICATION LIST PRIOR TO VISIT: Current Meds  Medication Sig   albuterol (VENTOLIN HFA) 108 (90 Base) MCG/ACT inhaler Inhale 2 puffs into the lungs every 6 (six) hours as needed for wheezing or shortness of breath.   ALPRAZolam (XANAX) 0.25 MG tablet Take 0.25 mg by mouth 2 (two) times daily as needed for anxiety or sleep.   amLODipine-valsartan (EXFORGE) 5-160 MG tablet Take 1 tablet by mouth daily.   aspirin EC 81 MG tablet Take 1 tablet (81 mg total) by mouth daily. Swallow whole.   Bempedoic Acid-Ezetimibe (NEXLIZET) 180-10 MG TABS Take 1 tablet by mouth daily at 12 noon.   buPROPion (WELLBUTRIN XL) 300 MG 24 hr tablet Take 1 tablet (300 mg total) by mouth daily.   celecoxib (CELEBREX) 200 MG capsule Take 200 mg by mouth daily.   Cholecalciferol (VITAMIN D PO) Take 1 tablet by mouth daily.   CONCERTA 36 MG CR tablet Take 36 mg by mouth daily.    FLUoxetine (PROZAC) 20 MG capsule Take 3 capsules (60 mg total) by  mouth daily. (Patient taking differently: Take 20 mg by mouth daily.)   HYDROcodone-acetaminophen (NORCO) 7.5-325 MG tablet Take 1-2 tablets by mouth every 4 (four) hours as needed for moderate pain.   inFLIXimab (REMICADE) 100 MG injection IV infusion every 2 months   lamoTRIgine (LAMICTAL) 100 MG tablet Take 100 mg by mouth 3 (three) times daily.    methocarbamol (ROBAXIN) 500 MG tablet Take 1 tablet (500 mg total) by mouth every 6 (six) hours as  needed for muscle spasms.   pantoprazole (PROTONIX) 40 MG tablet TAKE ONE TABLET TWICE DAILY (Patient taking differently: Take 40 mg by mouth 2 (two) times daily.)   rosuvastatin (CRESTOR) 10 MG tablet Take 10 mg by mouth daily.    Spacer/Aero-Holding Chambers (AEROCHAMBER MV) inhaler Use as instructed   SYMBICORT 160-4.5 MCG/ACT inhaler INHALE 2 PUFFS INTO THE LUNGS IN THE MORNING AND AT BEDTIME   SYNTHROID 50 MCG tablet TAKE 2 TABLETS EVERY MORNING (Patient taking differently: Take 100 mcg by mouth daily before breakfast.)     PAST MEDICAL HISTORY: Past Medical History:  Diagnosis Date   ADD (attention deficit disorder)    Allergy    Anxiety    Asthma    Attention deficit disorder without mention of hyperactivity    Back pain    Colitis 12/05/2010   Colon polyps    Complication of anesthesia    problem with grogginess and a long time to get over Anesthesia   Depression    Diverticulosis    GERD (gastroesophageal reflux disease)    H/O hiatal hernia    Hypertension    IBS (irritable bowel syndrome)    MCI (mild cognitive impairment)    Memory changes    Osteoarthrosis, unspecified whether generalized or localized, unspecified site    Other and unspecified hyperlipidemia    Renal cyst 10/2015   simple left   Repeated falls    Thyroid disease    Ulcerative colitis    Unspecified asthma(493.90)    Unspecified essential hypertension    taken off meds since lost wt    PAST SURGICAL HISTORY: Past Surgical History:  Procedure Laterality Date   ABDOMINAL EXPOSURE N/A 04/22/2014   Procedure: ABDOMINAL EXPOSURE;  Surgeon: Chuck Hint, MD;  Location: [facility];  Service: Vascular;  Laterality: N/A;   ANTERIOR LATERAL LUMBAR FUSION 4 LEVELS Left 04/22/2014   Procedure: Left Lumbar one/two, two/three, three/four, four,five. Anterior lateral lumbar interbody fusion**Stage 1**;  Surgeon: Maeola Harman, MD;  Location: [facility];  Service: Neurosurgery;  Laterality:  Left;   ANTERIOR LUMBAR FUSION N/A 04/22/2014   Procedure: Lumbar five/-Sacral one. Anterior lumbar interbody fusion with Dr. Edilia Bo; Left Lumbar one/two, two/three/three/four, four/five. Anterior lateral lumbar interbody fusion**Stage 1**;  Surgeon: Maeola Harman, MD;  Location: [facility];  Service: Neurosurgery;  Laterality: N/A;   BACK SURGERY     fluid removed from between disks   BREAST SURGERY     bil breast reduction    CARPAL TUNNEL RELEASE     CHOLECYSTECTOMY     Laparoscopic cholecystectomy   COLONOSCOPY     HIP FRACTURE SURGERY     left  11/01/2013   LUMBAR PERCUTANEOUS PEDICLE SCREW 4 LEVEL N/A 04/25/2014   Procedure: **Stage 2** Percutaneous pedicle screw placement L1-5;  Surgeon: Maeola Harman, MD;  Location: [facility];  Service: Neurosurgery;  Laterality: N/A;  **Stage 2** Percutaneous pedicle screw placement L1-5   SHOULDER SURGERY     right shoulder 3x  TOE SURGERY     right big toe   TOTAL HIP ARTHROPLASTY Right 07/07/2018   Procedure: TOTAL HIP ARTHROPLASTY ANTERIOR APPROACH;  Surgeon: Durene Romans, MD;  Location: [facility];  Service: Orthopedics;  Laterality: Right;  70 mins   TOTAL SHOULDER ARTHROPLASTY     UPPER GASTROINTESTINAL ENDOSCOPY  09/28/2016   Dilation    FAMILY HISTORY: The patient family history includes Arthritis in her father; Colon cancer (age of onset: 81) in her father; Healthy in her brother and daughter; Heart disease in her father and mother; Hypertension in her father.  SOCIAL HISTORY:  The patient  reports that she quit smoking about 49 years ago. Her smoking use included cigarettes. She started smoking about 59 years ago. She has never used smokeless tobacco. She reports current alcohol use of about 1.0 standard drink of alcohol per week. She reports that she does not use drugs.  REVIEW OF SYSTEMS: Review of Systems  Cardiovascular:  Positive for dyspnea on exertion (chronic and stable). Negative for chest pain, claudication, irregular  heartbeat, leg swelling, near-syncope, orthopnea, palpitations, paroxysmal nocturnal dyspnea and syncope.  Respiratory:  Positive for shortness of breath (chronic and stable).   Hematologic/Lymphatic: Negative for bleeding problem.  Musculoskeletal:  Negative for muscle cramps and myalgias.  Neurological:  Negative for dizziness and light-headedness.    PHYSICAL EXAM:    02/25/2023   11:31 AM 12/03/2022   11:57 AM 10/18/2022   10:39 AM  Vitals with BMI  Height 5\' 5"  5\' 5"  5\' 5"   Weight 150 lbs 150 lbs 10 oz 166 lbs 3 oz  BMI 24.96 25.06 27.66  Systolic 129 121 956  Diastolic 70 72 68  Pulse 52 70 64   Physical Exam  Constitutional: No distress.  Age appropriate, hemodynamically stable.   Neck: No JVD present.  Cardiovascular: Normal rate, regular rhythm, S1 normal, S2 normal, intact distal pulses and normal pulses. Exam reveals no gallop, no S3 and no S4.  No murmur heard. Pulmonary/Chest: Effort normal and breath sounds normal. No stridor. She has no wheezes. She has no rales.  Abdominal: Soft. Bowel sounds are normal. She exhibits no distension. There is no abdominal tenderness.  Musculoskeletal:        General: No edema.     Cervical back: Neck supple.  Neurological: She is alert and oriented to person, place, and time. She has intact cranial nerves (2-12).  Skin: Skin is warm and moist.   CARDIAC DATABASE: EKG: February 25, 2023: Sinus rhythm, 65 bpm, without underlying injury pattern.  Echocardiogram: 09/07/2021: Left ventricle cavity is normal in size. Mild concentric hypertrophy of the left ventricle. Normal global wall motion. Normal LV systolic function with EF 74%. Doppler evidence of grade I (impaired) diastolic dysfunction, normal LAP.  Left atrial cavity is mildly dilated. Aortic valve not well visualized. At least mild calcification evident. Mild aortic valve stenosis. Vmax 2.8 m/sec, mean PG 15 mmHg, AVA 1.3 cm by continuity equation. No regurgitation. Mild  tricuspid regurgitation. No evidence of pulmonary hypertension.   Stress Testing: Lexiscan Nuclear stress test 10/24/2021: Nondiagnostic ECG stress. The heart rate response was consistent with Lexiscan. Myocardial perfusion is normal. Overall LV systolic function is normal without regional wall motion abnormalities. Stress LV EF: 72%.  No previous exam available for comparison. Low risk.   Heart Catheterization: None  Carotid artery duplex 09/07/2021:  Duplex suggests stenosis in the right internal carotid artery (16-49%).  Duplex suggests stenosis in the right external carotid artery (<50%).  Duplex  suggests stenosis in the left internal carotid artery (16-49%).  Duplex suggests stenosis in the left external carotid artery (<50%).  There is mild heterogeneous plaque in bilateral carotid arteries.  Antegrade right vertebral artery flow. Antegrade left vertebral artery flow.  Follow up in one year is appropriate if clinically indicated.  Carotid artery duplex 11/21/2022:  Duplex suggests stenosis in the right internal carotid artery (1-15%).  <50% stenosis in the left carotid vessels.  Antegrade right vertebral artery flow. Antegrade left vertebral artery flow.  Compared to the study done on 09/07/2021, there is regression of bilateral ICA stenosis from 16 to 49%.  There is mild heterogenous plaque noted in  bilateral carotid arteries.  Follow-up studies if clinically indicated.   RADIOLOGY CT chest high-resolution: May 2024 1. No evidence of fibrotic interstitial lung disease. 2. Severe coronary artery calcifications. 3. Moderate aortic Atherosclerosis (ICD10-I70.0).   LABORATORY DATA: External Labs: Collected: October 11, 2020 TSH 1.88  Collected: 08/08/2021: Hemoglobin 12.1 g/dL, hematocrit 16.1%. BUN 18, creatinine 0.8. Sodium 138, potassium 4.5, chloride 105, bicarb 22. AST 28, ALT 24, alkaline phosphatase 69 TSH 0.99  Collected 10/11/2020: Total cholesterol 165,  triglycerides 94, HDL 61, LDL 85, non-HDL 104  External Labs: Collected: Nov 21, 2022 available in [facility]. Hemoglobin 12.3, hematocrit 40.3% Sodium 135, potassium 5.4, chloride 105, bicarb 23. AST 36, ALT 23, alkaline phosphatase 70. Serum creatinine 1.0 Total cholesterol 152, triglycerides 67, HDL 42, LDL 97, non-HDL 110 Apolipoprotein B 62 (within normal limits.)  IMPRESSION:    ICD-10-CM   1. Atherosclerosis of aorta (HCC)  I70.0 Bempedoic Acid-Ezetimibe (NEXLIZET) 180-10 MG TABS    Lipid Panel With LDL/HDL Ratio    LDL cholesterol, direct    CMP14+EGFR    2. Coronary atherosclerosis due to calcified coronary lesion  I25.10 Bempedoic Acid-Ezetimibe (NEXLIZET) 180-10 MG TABS   I25.84 Lipid Panel With LDL/HDL Ratio    LDL cholesterol, direct    CMP14+EGFR    3. Shortness of breath  R06.02 EKG 12-Lead    ECHOCARDIOGRAM COMPLETE    4. Atherosclerosis of both carotid arteries  I65.23 Bempedoic Acid-Ezetimibe (NEXLIZET) 180-10 MG TABS    Lipid Panel With LDL/HDL Ratio    LDL cholesterol, direct    CMP14+EGFR    5. Essential hypertension  I10     6. Mixed hyperlipidemia  E78.2     7. Aortic valve disease  I35.9 ECHOCARDIOGRAM COMPLETE       RECOMMENDATIONS: Yuan Piela is a 77 y.o. Caucasian female whose past medical history and cardiac risk factors include: Coronary artery calcification, aortic atherosclerosis, carotid artery disease, mild aortic stenosis, Hypothyroidism, hypertension, hyperlipidemia asthma, anxiety/depression, ADD, former smoker.   Atherosclerosis of aorta (HCC) Coronary atherosclerosis due to calcified coronary lesion Atherosclerosis of both carotid arteries Denies anginal chest pain or heart failure symptoms. EKG is nonischemic. Has undergone echo and stress test in the past. Most recent carotid duplex notes improvement in disease burden.  Monitor for now. Continue aspirin and statin therapy. Will add Nexlizet to optimize her  lipids she is she has atherosclerosis in multiple vascular beds. Recheck labs in 6 weeks to reevaluate therapy.  Shortness of breath Aortic valve disease Chronic, stable Has undergone appropriate ischemic workup as outlined above. Does follow-up with pulmonary medicine -being treated for asthma. Noted to have a degree of aortic stenosis on prior study.  Her murmur is quite audible and therefore we will proceed with an echocardiogram prior to next office visit for reevaluation.  Essential hypertension Office blood  pressures are very well-controlled. No medication changes warranted at this time  Mixed hyperlipidemia Currently on rosuvastatin.   She denies myalgia or other side effects. Most recent lipids dated March 2024, independently reviewed as noted above. Will initiate Nexlizet and labs in 6 weeks to reevaluate therapy.  Medication profile discussed.  FINAL MEDICATION LIST END OF ENCOUNTER: Meds ordered this encounter  Medications   Bempedoic Acid-Ezetimibe (NEXLIZET) 180-10 MG TABS    Sig: Take 1 tablet by mouth daily at 12 noon.    Dispense:  90 tablet    Refill:  0    There are no discontinued medications.    Current Outpatient Medications:    albuterol (VENTOLIN HFA) 108 (90 Base) MCG/ACT inhaler, Inhale 2 puffs into the lungs every 6 (six) hours as needed for wheezing or shortness of breath., Disp: 18 g, Rfl: 3   ALPRAZolam (XANAX) 0.25 MG tablet, Take 0.25 mg by mouth 2 (two) times daily as needed for anxiety or sleep., Disp: , Rfl:    amLODipine-valsartan (EXFORGE) 5-160 MG tablet, Take 1 tablet by mouth daily., Disp: 90 tablet, Rfl: 1   aspirin EC 81 MG tablet, Take 1 tablet (81 mg total) by mouth daily. Swallow whole., Disp: 30 tablet, Rfl: 11   Bempedoic Acid-Ezetimibe (NEXLIZET) 180-10 MG TABS, Take 1 tablet by mouth daily at 12 noon., Disp: 90 tablet, Rfl: 0   buPROPion (WELLBUTRIN XL) 300 MG 24 hr tablet, Take 1 tablet (300 mg total) by mouth daily., Disp: 90  tablet, Rfl: 1   celecoxib (CELEBREX) 200 MG capsule, Take 200 mg by mouth daily., Disp: , Rfl:    Cholecalciferol (VITAMIN D PO), Take 1 tablet by mouth daily., Disp: , Rfl:    CONCERTA 36 MG CR tablet, Take 36 mg by mouth daily. , Disp: , Rfl:    FLUoxetine (PROZAC) 20 MG capsule, Take 3 capsules (60 mg total) by mouth daily. (Patient taking differently: Take 20 mg by mouth daily.), Disp: 90 capsule, Rfl: 3   HYDROcodone-acetaminophen (NORCO) 7.5-325 MG tablet, Take 1-2 tablets by mouth every 4 (four) hours as needed for moderate pain., Disp: 60 tablet, Rfl: 0   inFLIXimab (REMICADE) 100 MG injection, IV infusion every 2 months, Disp: , Rfl:    lamoTRIgine (LAMICTAL) 100 MG tablet, Take 100 mg by mouth 3 (three) times daily. , Disp: , Rfl:    methocarbamol (ROBAXIN) 500 MG tablet, Take 1 tablet (500 mg total) by mouth every 6 (six) hours as needed for muscle spasms., Disp: 40 tablet, Rfl: 0   pantoprazole (PROTONIX) 40 MG tablet, TAKE ONE TABLET TWICE DAILY (Patient taking differently: Take 40 mg by mouth 2 (two) times daily.), Disp: 60 tablet, Rfl: 11   rosuvastatin (CRESTOR) 10 MG tablet, Take 10 mg by mouth daily. , Disp: , Rfl:    Spacer/Aero-Holding Chambers (AEROCHAMBER MV) inhaler, Use as instructed, Disp: 1 each, Rfl: 0   SYMBICORT 160-4.5 MCG/ACT inhaler, INHALE 2 PUFFS INTO THE LUNGS IN THE MORNING AND AT BEDTIME, Disp: 10.2 g, Rfl: 5   SYNTHROID 50 MCG tablet, TAKE 2 TABLETS EVERY MORNING (Patient taking differently: Take 100 mcg by mouth daily before breakfast.), Disp: 180 tablet, Rfl: 1 No current facility-administered medications for this visit.  Facility-Administered Medications Ordered in Other Visits:    denosumab (PROLIA) injection 60 mg, 60 mg, Subcutaneous, Once, Cleatis Polka., MD  Orders Placed This Encounter  Procedures   Lipid Panel With LDL/HDL Ratio   LDL cholesterol, direct   [id number]  EKG 12-Lead   ECHOCARDIOGRAM COMPLETE    There are no Patient  Instructions on file for this visit.   --Continue cardiac medications as reconciled in final medication list. --Return in about 3 months (around 05/27/2023) for Follow up aortic valve disease, echo, and CAC. Or sooner if needed. --Continue follow-up with your primary care physician regarding the management of your other chronic comorbid conditions.  Patient's questions and concerns were addressed to her satisfaction. She voices understanding of the instructions provided during this encounter.   This note was created using a voice recognition software as a result there may be grammatical errors inadvertently enclosed that do not reflect the nature of this encounter. Every attempt is made to correct such errors.  Tessa Lerner, [place], [facility]  Pager:  [phone] Office: [phone]

## 2023-03-06 DIAGNOSIS — Z96611 Presence of right artificial shoulder joint: Secondary | ICD-10-CM | POA: Diagnosis not present

## 2023-03-06 DIAGNOSIS — M25511 Pain in right shoulder: Secondary | ICD-10-CM | POA: Diagnosis not present

## 2023-04-03 DIAGNOSIS — M79642 Pain in left hand: Secondary | ICD-10-CM | POA: Diagnosis not present

## 2023-04-25 ENCOUNTER — Telehealth: Payer: Self-pay | Admitting: Pharmacy Technician

## 2023-04-25 DIAGNOSIS — J181 Lobar pneumonia, unspecified organism: Secondary | ICD-10-CM | POA: Diagnosis not present

## 2023-04-25 NOTE — Telephone Encounter (Signed)
Auth Submission: NO AUTH NEEDED Site of care: Site of care: [facility] Payer: MEDICARE A/B & SUPP Medication & CPT/J Code(s) submitted: Prolia (Denosumab) [id number] Route of submission (phone, fax, portal):  Phone # Fax # Auth type: Buy/Bill PB Units/visits requested: X2 Reference number:  Approval from: 06/24/22 to 06/24/23

## 2023-05-16 ENCOUNTER — Encounter: Payer: Self-pay | Admitting: Pulmonary Disease

## 2023-05-20 ENCOUNTER — Encounter: Payer: Self-pay | Admitting: Pulmonary Disease

## 2023-05-28 ENCOUNTER — Ambulatory Visit (HOSPITAL_COMMUNITY): Payer: Medicare Other | Attending: Cardiology

## 2023-05-28 DIAGNOSIS — I359 Nonrheumatic aortic valve disorder, unspecified: Secondary | ICD-10-CM | POA: Diagnosis not present

## 2023-05-28 DIAGNOSIS — I350 Nonrheumatic aortic (valve) stenosis: Secondary | ICD-10-CM

## 2023-05-28 DIAGNOSIS — R0602 Shortness of breath: Secondary | ICD-10-CM | POA: Diagnosis not present

## 2023-05-28 LAB — ECHOCARDIOGRAM COMPLETE
AR max vel: 1.62 cm2
AV Area VTI: 1.5 cm2
AV Area mean vel: 1.3 cm2
AV Mean grad: 15 mm[Hg]
AV Peak grad: 22.3 mm[Hg]
Ao pk vel: 2.36 m/s
Area-P 1/2: 3.11 cm2
S' Lateral: 2.4 cm

## 2023-06-05 DIAGNOSIS — L570 Actinic keratosis: Secondary | ICD-10-CM | POA: Diagnosis not present

## 2023-06-05 DIAGNOSIS — L814 Other melanin hyperpigmentation: Secondary | ICD-10-CM | POA: Diagnosis not present

## 2023-06-05 DIAGNOSIS — L821 Other seborrheic keratosis: Secondary | ICD-10-CM | POA: Diagnosis not present

## 2023-06-11 DIAGNOSIS — S20219A Contusion of unspecified front wall of thorax, initial encounter: Secondary | ICD-10-CM | POA: Diagnosis not present

## 2023-06-11 DIAGNOSIS — W19XXXA Unspecified fall, initial encounter: Secondary | ICD-10-CM | POA: Diagnosis not present

## 2023-06-11 DIAGNOSIS — R0789 Other chest pain: Secondary | ICD-10-CM | POA: Diagnosis not present

## 2023-06-12 ENCOUNTER — Encounter (HOSPITAL_COMMUNITY): Payer: Self-pay

## 2023-06-12 ENCOUNTER — Emergency Department (HOSPITAL_COMMUNITY): Payer: Medicare Other

## 2023-06-12 ENCOUNTER — Emergency Department (HOSPITAL_COMMUNITY)
Admission: EM | Admit: 2023-06-12 | Discharge: 2023-06-13 | Discharge status: Against Medical Advice | Payer: Medicare Other | Attending: Emergency Medicine | Admitting: Emergency Medicine

## 2023-06-12 DIAGNOSIS — F419 Anxiety disorder, unspecified: Secondary | ICD-10-CM | POA: Diagnosis not present

## 2023-06-12 DIAGNOSIS — R011 Cardiac murmur, unspecified: Secondary | ICD-10-CM | POA: Diagnosis not present

## 2023-06-12 DIAGNOSIS — K219 Gastro-esophageal reflux disease without esophagitis: Secondary | ICD-10-CM | POA: Diagnosis not present

## 2023-06-12 DIAGNOSIS — R079 Chest pain, unspecified: Secondary | ICD-10-CM | POA: Diagnosis not present

## 2023-06-12 DIAGNOSIS — R0602 Shortness of breath: Secondary | ICD-10-CM | POA: Insufficient documentation

## 2023-06-12 DIAGNOSIS — I10 Essential (primary) hypertension: Secondary | ICD-10-CM | POA: Diagnosis not present

## 2023-06-12 DIAGNOSIS — W19XXXA Unspecified fall, initial encounter: Secondary | ICD-10-CM | POA: Diagnosis not present

## 2023-06-12 DIAGNOSIS — E039 Hypothyroidism, unspecified: Secondary | ICD-10-CM | POA: Diagnosis not present

## 2023-06-12 DIAGNOSIS — Z79899 Other long term (current) drug therapy: Secondary | ICD-10-CM | POA: Diagnosis not present

## 2023-06-12 DIAGNOSIS — R0789 Other chest pain: Secondary | ICD-10-CM | POA: Diagnosis not present

## 2023-06-12 DIAGNOSIS — K589 Irritable bowel syndrome without diarrhea: Secondary | ICD-10-CM | POA: Diagnosis not present

## 2023-06-12 DIAGNOSIS — Z5321 Procedure and treatment not carried out due to patient leaving prior to being seen by health care provider: Secondary | ICD-10-CM | POA: Diagnosis not present

## 2023-06-12 DIAGNOSIS — I700 Atherosclerosis of aorta: Secondary | ICD-10-CM | POA: Diagnosis not present

## 2023-06-12 DIAGNOSIS — Z981 Arthrodesis status: Secondary | ICD-10-CM | POA: Diagnosis not present

## 2023-06-12 DIAGNOSIS — F32A Depression, unspecified: Secondary | ICD-10-CM | POA: Insufficient documentation

## 2023-06-12 DIAGNOSIS — M1990 Unspecified osteoarthritis, unspecified site: Secondary | ICD-10-CM | POA: Insufficient documentation

## 2023-06-12 DIAGNOSIS — J4489 Other specified chronic obstructive pulmonary disease: Secondary | ICD-10-CM | POA: Diagnosis not present

## 2023-06-12 DIAGNOSIS — S0990XA Unspecified injury of head, initial encounter: Secondary | ICD-10-CM | POA: Insufficient documentation

## 2023-06-12 DIAGNOSIS — R7889 Finding of other specified substances, not normally found in blood: Secondary | ICD-10-CM | POA: Diagnosis not present

## 2023-06-12 DIAGNOSIS — S199XXA Unspecified injury of neck, initial encounter: Secondary | ICD-10-CM | POA: Diagnosis not present

## 2023-06-12 DIAGNOSIS — I6529 Occlusion and stenosis of unspecified carotid artery: Secondary | ICD-10-CM | POA: Diagnosis not present

## 2023-06-12 DIAGNOSIS — R7989 Other specified abnormal findings of blood chemistry: Secondary | ICD-10-CM | POA: Diagnosis not present

## 2023-06-12 LAB — TROPONIN I (HIGH SENSITIVITY)
Troponin I (High Sensitivity): 32 ng/L — ABNORMAL HIGH (ref <18)
Troponin I (High Sensitivity): 35 ng/L — ABNORMAL HIGH (ref <18)

## 2023-06-12 LAB — CBC
HCT: 36.1 % (ref 36.0–46.0)
Hemoglobin: 12.6 g/dL (ref 12.0–15.0)
MCH: 34.1 pg — ABNORMAL HIGH (ref 26.0–34.0)
MCHC: 34.9 g/dL (ref 30.0–36.0)
MCV: 97.8 fL (ref 80.0–100.0)
Platelets: 295 10*3/uL (ref 150–400)
RBC: 3.69 MIL/uL — ABNORMAL LOW (ref 3.87–5.11)
RDW: 14.1 % (ref 11.5–15.5)
WBC: 21.1 10*3/uL — ABNORMAL HIGH (ref 4.0–10.5)
nRBC: 0 % (ref 0.0–0.2)

## 2023-06-12 LAB — BASIC METABOLIC PANEL
Anion gap: 13 (ref 5–15)
BUN: 34 mg/dL — ABNORMAL HIGH (ref 8–23)
CO2: 23 mmol/L (ref 22–32)
Calcium: 10.5 mg/dL — ABNORMAL HIGH (ref 8.9–10.3)
Chloride: 104 mmol/L (ref 98–111)
Creatinine, Ser: 1.18 mg/dL — ABNORMAL HIGH (ref 0.44–1.00)
GFR, Estimated: 48 mL/min — ABNORMAL LOW (ref >60)
Glucose, Bld: 98 mg/dL (ref 70–99)
Potassium: 4.7 mmol/L (ref 3.5–5.1)
Sodium: 140 mmol/L (ref 135–145)

## 2023-06-12 MED ORDER — TRAMADOL HCL 50 MG PO TABS: 50.0000 mg | ORAL_TABLET | Freq: Once | ORAL | Status: DC

## 2023-06-12 MED ORDER — TRAMADOL HCL 50 MG PO TABS
50.0000 mg | ORAL_TABLET | Freq: Once | ORAL | Status: AC
  Administered 2023-06-12: 50 mg via ORAL
  Filled 2023-06-12: qty 1

## 2023-06-12 MED ORDER — IOHEXOL 350 MG/ML SOLN
75.0000 mL | Freq: Once | INTRAVENOUS | Status: AC | PRN
  Administered 2023-06-12: 75 mL via INTRAVENOUS

## 2023-06-12 NOTE — ED Triage Notes (Signed)
Fall Saturday in which she hit her chest with her chin, went to UC they did blood work EKG, and did a lidocaine patch, she was called back because they said her cardiac enzymes was elevated. She has chest pain that able to be palpated, and it gets worse she takes a deep breath in as well.  No other complaints at this time.    Medic Vitals   152/98 75hr 142bgl 98%ra

## 2023-06-12 NOTE — ED Provider Triage Note (Signed)
Emergency Medicine Provider Triage Evaluation Note  [name] , a 77 y.o. female  was evaluated in triage.  Pt complains of fall. Fall several days ago and hit chin to chest. Went to urgent care yesterday, had labs and EKG and Trop was 29. Sent here for further evaluation. Patient had some SOB.   Review of Systems  Positive:  Negative:   Physical Exam  BP (!) 146/65 (BP Location: Left Arm)   Pulse 75   Temp 98.2 F (36.8 C) (Oral)   Resp 19   SpO2 99%  Gen:   Awake, no distress   Resp:  Normal effort, mild chest wall tenderness MSK:   Moves extremities without difficulty  Other:    Medical Decision Making  Medically screening exam initiated at 4:11 PM.  Appropriate orders placed.  [name] was informed that the remainder of the evaluation will be completed by another provider, this initial triage assessment does not replace that evaluation, and the importance of remaining in the ED until their evaluation is complete.  [name] is a 77 y.o. female here with SOB, head injury. Trop was 29, consider cardiac contusion vs PE. Will get ct head/neck, CTA chest, repeat troponin. Patient may wait in lobby     Charlynne Pander, MD 06/12/23 [phone]

## 2023-06-13 ENCOUNTER — Other Ambulatory Visit: Payer: Self-pay

## 2023-06-13 ENCOUNTER — Emergency Department (HOSPITAL_BASED_OUTPATIENT_CLINIC_OR_DEPARTMENT_OTHER)
Admission: EM | Admit: 2023-06-13 | Discharge: 2023-06-13 | Disposition: A | Discharge status: Home / Self Care | Payer: Medicare Other | Source: Home / Self Care | Attending: Emergency Medicine | Admitting: Emergency Medicine

## 2023-06-13 ENCOUNTER — Encounter (HOSPITAL_BASED_OUTPATIENT_CLINIC_OR_DEPARTMENT_OTHER): Payer: Self-pay | Admitting: Emergency Medicine

## 2023-06-13 DIAGNOSIS — R0602 Shortness of breath: Secondary | ICD-10-CM | POA: Insufficient documentation

## 2023-06-13 DIAGNOSIS — R0789 Other chest pain: Secondary | ICD-10-CM | POA: Insufficient documentation

## 2023-06-13 DIAGNOSIS — I10 Essential (primary) hypertension: Secondary | ICD-10-CM | POA: Insufficient documentation

## 2023-06-13 DIAGNOSIS — R7989 Other specified abnormal findings of blood chemistry: Secondary | ICD-10-CM | POA: Insufficient documentation

## 2023-06-13 DIAGNOSIS — E039 Hypothyroidism, unspecified: Secondary | ICD-10-CM | POA: Insufficient documentation

## 2023-06-13 DIAGNOSIS — Z79899 Other long term (current) drug therapy: Secondary | ICD-10-CM | POA: Insufficient documentation

## 2023-06-13 LAB — BASIC METABOLIC PANEL
Anion gap: 9 (ref 5–15)
BUN: 37 mg/dL — ABNORMAL HIGH (ref 8–23)
CO2: 24 mmol/L (ref 22–32)
Calcium: 9.8 mg/dL (ref 8.9–10.3)
Chloride: 107 mmol/L (ref 98–111)
Creatinine, Ser: 1.07 mg/dL — ABNORMAL HIGH (ref 0.44–1.00)
GFR, Estimated: 53 mL/min — ABNORMAL LOW (ref >60)
Glucose, Bld: 158 mg/dL — ABNORMAL HIGH (ref 70–99)
Potassium: 4.3 mmol/L (ref 3.5–5.1)
Sodium: 140 mmol/L (ref 135–145)

## 2023-06-13 LAB — CBC
HCT: 35 % — ABNORMAL LOW (ref 36.0–46.0)
Hemoglobin: 11.7 g/dL — ABNORMAL LOW (ref 12.0–15.0)
MCH: 31.9 pg (ref 26.0–34.0)
MCHC: 33.4 g/dL (ref 30.0–36.0)
MCV: 95.4 fL (ref 80.0–100.0)
Platelets: 265 10*3/uL (ref 150–400)
RBC: 3.67 MIL/uL — ABNORMAL LOW (ref 3.87–5.11)
RDW: 14.4 % (ref 11.5–15.5)
WBC: 11.5 10*3/uL — ABNORMAL HIGH (ref 4.0–10.5)
nRBC: 0 % (ref 0.0–0.2)

## 2023-06-13 LAB — TROPONIN I (HIGH SENSITIVITY): Troponin I (High Sensitivity): 18 ng/L — ABNORMAL HIGH (ref <18)

## 2023-06-13 NOTE — ED Notes (Signed)
Patient has decided to leave AMA. Patient was moved OTF.

## 2023-06-13 NOTE — ED Provider Notes (Signed)
[place] [facility]ERGENC[facility] DEPARTMENT AT [facility] Provider Note   CSN: [id number] Arrival date & time: 06/13/23  1029     History  Chief Complaint  Patient presents with   Shortness of Breath   Abnormal Labs    [name] is a 77 y.o. female.  Pt is a 77 yo female with pmhx significant for anxiety, depression, hypothyroidism, gerd, IBS, uc, arthritis, colitis, htn, and add.  Pt said she fell on 12/14 and her chin hit her chest.  She's been having pain in her chest since the fall.  Pt went to UC on 12/18 and was put on omnicef and prednisone.  She was not given anything for pain, so she went back to UC yesterday.  She was given tramadol.  She also had some labs which showed a mildly elevated troponin (29).  She was called and told to go to the ED.  She went to [facility] ED and waited in the [facility] for 12 hrs.  Labs were done while waiting and CT chest done as well.  Pt is mainly here because she wanted to see a doctor about her abnormal labs.  Pain has improved with tramadol.        Home Medications Prior to Admission medications   Medication Sig Start Date End Date Taking? Authorizing Provider  albuterol (VENTOLIN HFA) 108 (90 Base) MCG/ACT inhaler Inhale 2 puffs into the lungs every 6 (six) hours as needed for wheezing or shortness of breath. 11/09/21   Mannam, Colbert Coyer, MD  ALPRAZolam (XANAX) 0.25 MG tablet Take 0.25 mg by mouth 2 (two) times daily as needed for anxiety or sleep. 02/19/19   [provider]  amLODipine-valsartan (EXFORGE) 5-160 MG tablet Take 1 tablet by mouth daily. 05/11/18   Plotnikov, Georgina Quint, MD  aspirin EC 81 MG tablet Take 1 tablet (81 mg total) by mouth daily. Swallow whole. 09/18/21   Tolia, Sunit, DO  buPROPion (WELLBUTRIN XL) 300 MG 24 hr tablet Take 1 tablet (300 mg total) by mouth daily. 12/22/14   Plotnikov, Georgina Quint, MD  celecoxib (CELEBREX) 200 MG capsule Take 200 mg by mouth daily. 09/13/20   [provider]   Cholecalciferol (VITAMIN D PO) Take 1 tablet by mouth daily.    [provider]  CONCERTA 36 MG CR tablet Take 36 mg by mouth daily.  12/06/14   [provider]  FLUoxetine (PROZAC) 20 MG capsule Take 3 capsules (60 mg total) by mouth daily. Patient taking differently: Take 20 mg by mouth daily. 04/23/17   Plotnikov, Georgina Quint, MD  HYDROcodone-acetaminophen (NORCO) 7.5-325 MG tablet Take 1-2 tablets by mouth every 4 (four) hours as needed for moderate pain. 07/12/18   Lanney Gins, PA-C  inFLIXimab (REMICADE) 100 MG injection IV infusion every 2 months 06/10/18   [provider]  lamoTRIgine (LAMICTAL) 100 MG tablet Take 100 mg by mouth 3 (three) times daily.  05/31/15   [provider]  methocarbamol (ROBAXIN) 500 MG tablet Take 1 tablet (500 mg total) by mouth every 6 (six) hours as needed for muscle spasms. 07/07/18   Lanney Gins, PA-C  pantoprazole (PROTONIX) 40 MG tablet TAKE ONE TABLET TWICE DAILY Patient taking differently: Take 40 mg by mouth 2 (two) times daily. 01/13/18   Plotnikov, Georgina Quint, MD  rosuvastatin (CRESTOR) 10 MG tablet Take 10 mg by mouth daily.     [provider]  Spacer/Aero-Holding Chambers (AEROCHAMBER MV) inhaler Use as instructed 11/09/21   Chilton Greathouse, MD  SYMBICORT 160-4.5 MCG/ACT inhaler INHALE 2 PUFFS INTO THE LUNGS IN THE MORNING AND AT BEDTIME 11/23/21   Mannam, Praveen, MD  SYNTHROID 50 MCG tablet TAKE 2 TABLETS EVERY MORNING Patient taking differently: Take 100 mcg by mouth daily before breakfast. 10/01/18   Plotnikov, Georgina Quint, MD  losartan-hydrochlorothiazide (HYZAAR) 50-12.5 MG per tablet Take 1 tablet by mouth daily.  09/04/11  [provider]  simvastatin (ZOCOR) 20 MG tablet Take 20 mg by mouth every evening.  09/04/11  [provider]      Allergies    Ampicillin, Bee pollen, Erythromycin, Hctz [hydrochlorothiazide], Metoprolol, Molds & smuts, Morphine and codeine, and Penicillin g     Review of Systems   Review of Systems  Cardiovascular:  Positive for chest pain.  All other systems reviewed and are negative.   Physical Exam Updated Vital Signs BP 113/66 (BP Location: Right Arm)   Pulse 67   Temp 97.9 F (36.6 C) (Oral)   Resp 18   Ht 5\' 5"  (1.651 m)   Wt 68 kg   SpO2 99%   BMI 24.95 kg/m  Physical Exam Vitals and nursing note reviewed.  Constitutional:      Appearance: She is well-developed.  HENT:     Head: Normocephalic and atraumatic.     Mouth/Throat:     Mouth: Mucous membranes are moist.     Pharynx: Oropharynx is clear.  Eyes:     Extraocular Movements: Extraocular movements intact.     Pupils: Pupils are equal, round, and reactive to light.  Cardiovascular:     Rate and Rhythm: Normal rate and regular rhythm.  Pulmonary:     Effort: Pulmonary effort is normal.     Breath sounds: Normal breath sounds.  Chest:    Abdominal:     General: Bowel sounds are normal.     Palpations: Abdomen is soft.  Musculoskeletal:        General: Normal range of motion.     Cervical back: Normal range of motion and neck supple.  Skin:    General: Skin is warm.     Capillary Refill: Capillary refill takes less than 2 seconds.  Neurological:     General: No focal deficit present.     Mental Status: She is alert and oriented to person, place, and time.  Psychiatric:        Mood and Affect: Mood normal.        Behavior: Behavior normal.     ED Results / Procedures / Treatments   Labs (all labs ordered are listed, but only abnormal results are displayed) Labs Reviewed  BASIC METABOLIC PANEL - Abnormal; Notable for the following components:      Result Value   Glucose, Bld 158 (*)    BUN 37 (*)    Creatinine, Ser 1.07 (*)    GFR, Estimated 53 (*)    All other components within normal limits  CBC - Abnormal; Notable for the following components:   WBC 11.5 (*)    RBC 3.67 (*)    Hemoglobin 11.7 (*)    HCT 35.0 (*)    All other components  within normal limits  TROPONIN I (HIGH SENSITIVITY) - Abnormal; Notable for the following components:   Troponin I (High Sensitivity) 18 (*)    All other components within normal limits  TROPONIN I (HIGH SENSITIVITY)    EKG None  Radiology CT Angio Chest PE W and/or Wo Contrast Result Date: 06/12/2023 CLINICAL DATA:  Chest pain  history of fall EXAM: CT ANGIOGRAPHY CHEST WITH CONTRAST TECHNIQUE: Multidetector CT imaging of the chest was performed using the standard protocol during bolus administration of intravenous contrast. Multiplanar CT image reconstructions and MIPs were obtained to evaluate the vascular anatomy. RADIATION DOSE REDUCTION: This exam was performed according to the departmental dose-optimization program which includes automated exposure control, adjustment of the mA and/or kV according to patient size and/or use of iterative reconstruction technique. CONTRAST:  75mL OMNIPAQUE IOHEXOL 350 MG/ML SOLN COMPARISON:  Chest x-ray 06/12/2023, CT 11/22/2022 FINDINGS: Cardiovascular: Satisfactory opacification of the pulmonary arteries to the segmental level. No evidence of pulmonary embolism. Mild aortic atherosclerosis. No aneurysm. Coronary vascular calcification. Upper normal cardiac size. No pericardial effusion. Mediastinum/Nodes: Patent trachea. No thyroid mass. No suspicious lymph nodes. Esophagus within limits. Lungs/Pleura: Lungs are clear. No pleural effusion or pneumothorax. Upper Abdomen: No acute finding. Musculoskeletal: Sternum appears intact. Mild chronic compression deformity at T4. Right shoulder replacement Review of the MIP images confirms the above findings. IMPRESSION: 1. Negative. No CT evidence for acute pulmonary embolus. Clear lung fields. 2. Aortic atherosclerosis. Aortic Atherosclerosis (ICD10-I70.0). Electronically Signed   By: Jasmine Pang M.D.   On: 06/12/2023 21:56   DG Chest 2 View Result Date: 06/12/2023 CLINICAL DATA:  Chest pain fall 2 days ago EXAM:  CHEST - 2 VIEW COMPARISON:  Radiographs 10/16/2020 and CT 11/22/2022 FINDINGS: Stable cardiomediastinal silhouette. Aortic atherosclerotic calcification. Chronic bronchitic changes and hyperinflation. Bibasilar atelectasis/scarring. Otherwise no focal consolidation. No pleural effusion or pneumothorax. Right reverse TSA. Lumbar fusion hardware. IMPRESSION: No acute cardiopulmonary process.  Emphysema. Electronically Signed   By: Minerva Fester M.D.   On: 06/12/2023 20:02   CT HEAD WO CONTRAST ( ) Result Date: 06/12/2023 CLINICAL DATA:  Head trauma EXAM: CT HEAD WITHOUT CONTRAST CT CERVICAL SPINE WITHOUT CONTRAST TECHNIQUE: Multidetector CT imaging of the head and cervical spine was performed following the standard protocol without intravenous contrast. Multiplanar CT image reconstructions of the cervical spine were also generated. RADIATION DOSE REDUCTION: This exam was performed according to the departmental dose-optimization program which includes automated exposure control, adjustment of the mA and/or kV according to patient size and/or use of iterative reconstruction technique. COMPARISON:  11/05/2018 cervical spine MRI. FINDINGS: CT HEAD FINDINGS Brain: There is no mass, hemorrhage or extra-axial collection. The size and configuration of the ventricles and extra-axial CSF spaces are normal. There is hypoattenuation of the periventricular white matter, most commonly indicating chronic ischemic microangiopathy. Vascular: No abnormal hyperdensity of the major intracranial arteries or dural venous sinuses. No intracranial atherosclerosis. Skull: The visualized skull base, calvarium and extracranial soft tissues are normal. Sinuses/Orbits: No fluid levels or advanced mucosal thickening of the visualized paranasal sinuses. No mastoid or middle ear effusion. The orbits are normal. CT CERVICAL SPINE FINDINGS Alignment: Anterolisthesis at C4-5 (grade 1) is new since 11/05/2018. There is anterior translation of  the left facet joint. Skull base and vertebrae: Fusion of the C5 and C6 vertebral bodies and left facets. No acute fracture. Soft tissues and spinal canal: No prevertebral fluid or swelling. No visible canal hematoma. Disc levels: No advanced spinal canal or neural foraminal stenosis. Upper chest: No pneumothorax, pulmonary nodule or pleural effusion. Other: Carotid atherosclerosis. IMPRESSION: 1. No acute intracranial abnormality. 2. Chronic ischemic microangiopathy. 3. Grade 1 anterolisthesis at C4-5 is new since 11/05/2018 and associated with some anterior translation of the left C4 facet on C5, age indeterminate. MRI may be helpful to assess the integrity of the ligaments and to look for bone  marrow edema. Electronically Signed   By: Deatra Robinson M.D.   On: 06/12/2023 19:35   CT Cervical Spine Wo Contrast Result Date: 06/12/2023 CLINICAL DATA:  Head trauma EXAM: CT HEAD WITHOUT CONTRAST CT CERVICAL SPINE WITHOUT CONTRAST TECHNIQUE: Multidetector CT imaging of the head and cervical spine was performed following the standard protocol without intravenous contrast. Multiplanar CT image reconstructions of the cervical spine were also generated. RADIATION DOSE REDUCTION: This exam was performed according to the departmental dose-optimization program which includes automated exposure control, adjustment of the mA and/or kV according to patient size and/or use of iterative reconstruction technique. COMPARISON:  11/05/2018 cervical spine MRI. FINDINGS: CT HEAD FINDINGS Brain: There is no mass, hemorrhage or extra-axial collection. The size and configuration of the ventricles and extra-axial CSF spaces are normal. There is hypoattenuation of the periventricular white matter, most commonly indicating chronic ischemic microangiopathy. Vascular: No abnormal hyperdensity of the major intracranial arteries or dural venous sinuses. No intracranial atherosclerosis. Skull: The visualized skull base, calvarium and  extracranial soft tissues are normal. Sinuses/Orbits: No fluid levels or advanced mucosal thickening of the visualized paranasal sinuses. No mastoid or middle ear effusion. The orbits are normal. CT CERVICAL SPINE FINDINGS Alignment: Anterolisthesis at C4-5 (grade 1) is new since 11/05/2018. There is anterior translation of the left facet joint. Skull base and vertebrae: Fusion of the C5 and C6 vertebral bodies and left facets. No acute fracture. Soft tissues and spinal canal: No prevertebral fluid or swelling. No visible canal hematoma. Disc levels: No advanced spinal canal or neural foraminal stenosis. Upper chest: No pneumothorax, pulmonary nodule or pleural effusion. Other: Carotid atherosclerosis. IMPRESSION: 1. No acute intracranial abnormality. 2. Chronic ischemic microangiopathy. 3. Grade 1 anterolisthesis at C4-5 is new since 11/05/2018 and associated with some anterior translation of the left C4 facet on C5, age indeterminate. MRI may be helpful to assess the integrity of the ligaments and to look for bone marrow edema. Electronically Signed   By: Deatra Robinson M.D.   On: 06/12/2023 19:35    Procedures Procedures    Medications Ordered in ED Medications - No data to display  ED Course/ Medical Decision Making/ A&P                                 Medical Decision Making Amount and/or Complexity of Data Reviewed Labs: ordered. Radiology: ordered.   This patient presents to the ED for concern of cp, this involves an extensive number of treatment options, and is a complaint that carries with it a high risk of complications and morbidity.  The differential diagnosis includes cardiac, msk, pulm   Co morbidities that complicate the patient evaluation  anxiety, depression, hypothyroidism, gerd, IBS, uc, arthritis, colitis, htn, and add.   Additional history obtained:  Additional history obtained from epic chart review External records from outside source obtained and reviewed  including friend   Lab Tests:  I Ordered, and personally interpreted labs.  The pertinent results include:  cbc with with wbc 11.5 (21.1 yesterday, likely from steroids), trop stable yesterday and down to 18 today   Imaging Studies ordered:  I reviewed CT scans done yesterday.  CT head/C-spine/Chest CT head/neck: IMPRESSION: 1. No acute intracranial abnormality. 2. Chronic ischemic microangiopathy. 3. Grade 1 anterolisthesis at C4-5 is new since 11/05/2018 and associated with some anterior translation of the left C4 facet on C5, age indeterminate. MRI may be helpful to assess the integrity  of the ligaments and to look for bone marrow edema. CT chest: Negative. No CT evidence for acute pulmonary embolus. Clear lung fields. 2. Aortic atherosclerosis.   Aortic Atherosclerosis (ICD10-I70.0). I agree with the radiologist interpretation   Cardiac Monitoring:  The patient was maintained on a cardiac monitor.  I personally viewed and interpreted the cardiac monitored which showed an underlying rhythm of: nsr   Medicines ordered and prescription drug management:   I have reviewed the patients home medicines and have made adjustments as needed  Problem List / ED Course:  Elevated trop:  improved.  Cp after hitting it with her chin.  Very atypical for cardiac.  Pt is stable for d/c.  Return if worse.    Reevaluation:  After the interventions noted above, I reevaluated the patient and found that they have :improved   Social Determinants of Health:  Lives at home   Dispostion:  After consideration of the diagnostic results and the patients response to treatment, I feel that the patent would benefit from discharge with outpatient f/u.          Final Clinical Impression(s) / ED Diagnoses Final diagnoses:  Chest wall pain    Rx / DC Orders ED Discharge Orders     None         Jacalyn Lefevre, MD 06/13/23 1234

## 2023-06-13 NOTE — ED Triage Notes (Signed)
Pt via pov from home with sob and was called by UC and told that her labs were abnormal. Went to [facility] yesterday and LWBS after 12 hours. Pt alert & oriented, nad noted.

## 2023-06-17 ENCOUNTER — Encounter (HOSPITAL_BASED_OUTPATIENT_CLINIC_OR_DEPARTMENT_OTHER): Payer: Self-pay

## 2023-06-17 ENCOUNTER — Emergency Department (HOSPITAL_BASED_OUTPATIENT_CLINIC_OR_DEPARTMENT_OTHER)
Admission: EM | Admit: 2023-06-17 | Discharge: 2023-06-17 | Disposition: A | Discharge status: Home / Self Care | Payer: Medicare Other | Attending: Emergency Medicine | Admitting: Emergency Medicine

## 2023-06-17 ENCOUNTER — Other Ambulatory Visit (HOSPITAL_BASED_OUTPATIENT_CLINIC_OR_DEPARTMENT_OTHER): Payer: Self-pay

## 2023-06-17 ENCOUNTER — Emergency Department (HOSPITAL_BASED_OUTPATIENT_CLINIC_OR_DEPARTMENT_OTHER): Payer: Medicare Other

## 2023-06-17 ENCOUNTER — Encounter: Payer: Self-pay | Admitting: Pulmonary Disease

## 2023-06-17 ENCOUNTER — Other Ambulatory Visit: Payer: Self-pay

## 2023-06-17 DIAGNOSIS — N281 Cyst of kidney, acquired: Secondary | ICD-10-CM | POA: Diagnosis not present

## 2023-06-17 DIAGNOSIS — S299XXA Unspecified injury of thorax, initial encounter: Secondary | ICD-10-CM | POA: Diagnosis not present

## 2023-06-17 DIAGNOSIS — E875 Hyperkalemia: Secondary | ICD-10-CM | POA: Diagnosis not present

## 2023-06-17 DIAGNOSIS — K5190 Ulcerative colitis, unspecified, without complications: Secondary | ICD-10-CM | POA: Insufficient documentation

## 2023-06-17 DIAGNOSIS — E785 Hyperlipidemia, unspecified: Secondary | ICD-10-CM | POA: Insufficient documentation

## 2023-06-17 DIAGNOSIS — R0789 Other chest pain: Secondary | ICD-10-CM | POA: Diagnosis not present

## 2023-06-17 DIAGNOSIS — R0602 Shortness of breath: Secondary | ICD-10-CM | POA: Insufficient documentation

## 2023-06-17 DIAGNOSIS — M4854XA Collapsed vertebra, not elsewhere classified, thoracic region, initial encounter for fracture: Secondary | ICD-10-CM | POA: Diagnosis not present

## 2023-06-17 DIAGNOSIS — I10 Essential (primary) hypertension: Secondary | ICD-10-CM | POA: Insufficient documentation

## 2023-06-17 DIAGNOSIS — F419 Anxiety disorder, unspecified: Secondary | ICD-10-CM | POA: Insufficient documentation

## 2023-06-17 DIAGNOSIS — Z79899 Other long term (current) drug therapy: Secondary | ICD-10-CM | POA: Insufficient documentation

## 2023-06-17 DIAGNOSIS — F32A Depression, unspecified: Secondary | ICD-10-CM | POA: Insufficient documentation

## 2023-06-17 DIAGNOSIS — W19XXXA Unspecified fall, initial encounter: Secondary | ICD-10-CM | POA: Diagnosis not present

## 2023-06-17 DIAGNOSIS — K219 Gastro-esophageal reflux disease without esophagitis: Secondary | ICD-10-CM | POA: Diagnosis not present

## 2023-06-17 DIAGNOSIS — Z7982 Long term (current) use of aspirin: Secondary | ICD-10-CM | POA: Insufficient documentation

## 2023-06-17 DIAGNOSIS — Z20822 Contact with and (suspected) exposure to covid-19: Secondary | ICD-10-CM | POA: Insufficient documentation

## 2023-06-17 DIAGNOSIS — I700 Atherosclerosis of aorta: Secondary | ICD-10-CM | POA: Diagnosis not present

## 2023-06-17 DIAGNOSIS — G3184 Mild cognitive impairment, so stated: Secondary | ICD-10-CM | POA: Insufficient documentation

## 2023-06-17 LAB — BASIC METABOLIC PANEL
Anion gap: 7 (ref 5–15)
BUN: 38 mg/dL — ABNORMAL HIGH (ref 8–23)
CO2: 29 mmol/L (ref 22–32)
Calcium: 9.5 mg/dL (ref 8.9–10.3)
Chloride: 104 mmol/L (ref 98–111)
Creatinine, Ser: 1.13 mg/dL — ABNORMAL HIGH (ref 0.44–1.00)
GFR, Estimated: 50 mL/min — ABNORMAL LOW (ref >60)
Glucose, Bld: 91 mg/dL (ref 70–99)
Potassium: 5.3 mmol/L — ABNORMAL HIGH (ref 3.5–5.1)
Sodium: 140 mmol/L (ref 135–145)

## 2023-06-17 LAB — RESP PANEL BY RT-PCR (RSV, FLU A&B, COVID)  RVPGX2
Influenza A by PCR: NEGATIVE
Influenza B by PCR: NEGATIVE
Resp Syncytial Virus by PCR: NEGATIVE
SARS Coronavirus 2 by RT PCR: NEGATIVE

## 2023-06-17 LAB — CBC WITH DIFFERENTIAL/PLATELET
Abs Immature Granulocytes: 0.1 10*3/uL — ABNORMAL HIGH (ref 0.00–0.07)
Basophils Absolute: 0.1 10*3/uL (ref 0.0–0.1)
Basophils Relative: 1 %
Eosinophils Absolute: 0.6 10*3/uL — ABNORMAL HIGH (ref 0.0–0.5)
Eosinophils Relative: 6 %
HCT: 38.5 % (ref 36.0–46.0)
Hemoglobin: 12.4 g/dL (ref 12.0–15.0)
Immature Granulocytes: 1 %
Lymphocytes Relative: 21 %
Lymphs Abs: 2 10*3/uL (ref 0.7–4.0)
MCH: 30.5 pg (ref 26.0–34.0)
MCHC: 32.2 g/dL (ref 30.0–36.0)
MCV: 94.6 fL (ref 80.0–100.0)
Monocytes Absolute: 1 10*3/uL (ref 0.1–1.0)
Monocytes Relative: 11 %
Neutro Abs: 5.8 10*3/uL (ref 1.7–7.7)
Neutrophils Relative %: 60 %
Platelets: 275 10*3/uL (ref 150–400)
RBC: 4.07 MIL/uL (ref 3.87–5.11)
RDW: 14.2 % (ref 11.5–15.5)
WBC: 9.5 10*3/uL (ref 4.0–10.5)
nRBC: 0 % (ref 0.0–0.2)

## 2023-06-17 LAB — TROPONIN I (HIGH SENSITIVITY): Troponin I (High Sensitivity): 7 ng/L (ref <18)

## 2023-06-17 MED ORDER — IOHEXOL 300 MG/ML  SOLN
100.0000 mL | Freq: Once | INTRAMUSCULAR | Status: AC | PRN
  Administered 2023-06-17: 80 mL via INTRAVENOUS

## 2023-06-17 MED ORDER — FENTANYL CITRATE PF 50 MCG/ML IJ SOSY
50.0000 ug | PREFILLED_SYRINGE | Freq: Once | INTRAMUSCULAR | Status: AC
  Administered 2023-06-17: 50 ug via INTRAVENOUS
  Filled 2023-06-17: qty 1

## 2023-06-17 MED ORDER — HYDROCODONE-ACETAMINOPHEN 5-325 MG PO TABS
1.0000 | ORAL_TABLET | Freq: Four times a day (QID) | ORAL | 0 refills | Status: DC | PRN
Start: 2023-06-17 — End: 2024-03-12
  Filled 2023-06-17: qty 10, 2d supply, fill #0

## 2023-06-17 NOTE — ED Triage Notes (Addendum)
Patient arrives with complaints of worsening chest wall pain/upper body pain. Patient reports that she had a fall where her chin went into her chest. Patient states that she was seen at the ED and noted to have abnormal cardiac labs (had a chest pain workup). Rates her pain an 8/10. No relief with tramadol at home.

## 2023-06-17 NOTE — Discharge Instructions (Addendum)
Your cardiac enzyme was normal.  Your chest pain improved with IV opiates.  Will prescribe a short course of opiates for likely chest wall pain.  There was no evidence of acute traumatic injury seen on your CT imaging, there was evidence of an old compression fracture in your spine which is likely an incidental finding. IMPRESSION:  *No acute traumatic injury to the chest. Redemonstration of mild  anterior wedging deformity of T4 vertebral body without interval  change. No significant retropulsion or spinal canal compromise.  *No lung mass, consolidation, lung contusion, pneumothorax or  hemothorax.  *Multiple other nonacute observations, as described above.    Aortic Atherosclerosis (ICD10-I70.0).

## 2023-06-17 NOTE — ED Provider Notes (Signed)
[place] [facility] AT [facility] Provider Note   CSN: [id number] Arrival date & time: 06/17/23  4132     History  Chief Complaint  Patient presents with   Chest Wall Pain   Pain    [name] is a 77 y.o. female.  HPI   77 year old female with medical history significant for anxiety, depression, GERD, IBS, ulcerative colitis, HLD, HTN, mild cognitive impairment who presents to the emergency department with chest wall pain.  The patient was seen in the emergency department on 12/19 and 12/20 after a fall with a chief complaint of chest pain.  She had a negative chest pain workup and had imaging done to include CT imaging of the chest which had reportedly showed no evidence of sternal fracture.  The patient returns today because she has had persistent ongoing pain, worse with deep inspiration, feels like she may have fractured something.  Pain has been getting worse not better.  It is sharp, worse with any chest wall movement.  No new falls or trauma.  Home Medications Prior to Admission medications   Medication Sig Start Date End Date Taking? Authorizing Provider  HYDROcodone-acetaminophen (NORCO/VICODIN) 5-325 MG tablet Take 1-2 tablets by mouth every 6 (six) hours as needed for severe pain (pain score 7-10). 06/17/23  Yes Ernie Avena, MD  albuterol (VENTOLIN HFA) 108 (90 Base) MCG/ACT inhaler Inhale 2 puffs into the lungs every 6 (six) hours as needed for wheezing or shortness of breath. 11/09/21   Mannam, Colbert Coyer, MD  ALPRAZolam (XANAX) 0.25 MG tablet Take 0.25 mg by mouth 2 (two) times daily as needed for anxiety or sleep. 02/19/19   [provider]  amLODipine-valsartan (EXFORGE) 5-160 MG tablet Take 1 tablet by mouth daily. 05/11/18   Plotnikov, Georgina Quint, MD  aspirin EC 81 MG tablet Take 1 tablet (81 mg total) by mouth daily. Swallow whole. 09/18/21   Tolia, Sunit, DO  buPROPion (WELLBUTRIN XL) 300 MG 24 hr tablet Take 1 tablet (300  mg total) by mouth daily. 12/22/14   Plotnikov, Georgina Quint, MD  celecoxib (CELEBREX) 200 MG capsule Take 200 mg by mouth daily. 09/13/20   [provider]  Cholecalciferol (VITAMIN D PO) Take 1 tablet by mouth daily.    [provider]  CONCERTA 36 MG CR tablet Take 36 mg by mouth daily.  12/06/14   [provider]  FLUoxetine (PROZAC) 20 MG capsule Take 3 capsules (60 mg total) by mouth daily. Patient taking differently: Take 20 mg by mouth daily. 04/23/17   Plotnikov, Georgina Quint, MD  inFLIXimab (REMICADE) 100 MG injection IV infusion every 2 months 06/10/18   [provider]  lamoTRIgine (LAMICTAL) 100 MG tablet Take 100 mg by mouth 3 (three) times daily.  05/31/15   [provider]  methocarbamol (ROBAXIN) 500 MG tablet Take 1 tablet (500 mg total) by mouth every 6 (six) hours as needed for muscle spasms. 07/07/18   Lanney Gins, PA-C  pantoprazole (PROTONIX) 40 MG tablet TAKE ONE TABLET TWICE DAILY Patient taking differently: Take 40 mg by mouth 2 (two) times daily. 01/13/18   Plotnikov, Georgina Quint, MD  rosuvastatin (CRESTOR) 10 MG tablet Take 10 mg by mouth daily.     [provider]  Spacer/Aero-Holding Chambers (AEROCHAMBER MV) inhaler Use as instructed 11/09/21   Chilton Greathouse, MD  SYMBICORT 160-4.5 MCG/ACT inhaler INHALE 2 PUFFS INTO THE LUNGS IN THE MORNING AND AT BEDTIME 11/23/21   Mannam, Colbert Coyer, MD  SYNTHROID 50 MCG  tablet TAKE 2 TABLETS EVERY MORNING Patient taking differently: Take 100 mcg by mouth daily before breakfast. 10/01/18   Plotnikov, Georgina Quint, MD  losartan-hydrochlorothiazide (HYZAAR) 50-12.5 MG per tablet Take 1 tablet by mouth daily.  09/04/11  [provider]  simvastatin (ZOCOR) 20 MG tablet Take 20 mg by mouth every evening.  09/04/11  [provider]      Allergies    Ampicillin, Bee pollen, Erythromycin, Hctz [hydrochlorothiazide], Metoprolol, Molds & smuts, Morphine and codeine, and Penicillin g     Review of Systems   Review of Systems  Respiratory:  Positive for shortness of breath.   Cardiovascular:  Positive for chest pain.  All other systems reviewed and are negative.   Physical Exam Updated Vital Signs BP 108/64   Pulse 63   Temp 98.6 F (37 C) (Oral)   Resp 19   Ht 5\' 5"  (1.651 m)   Wt 68 kg   SpO2 96%   BMI 24.95 kg/m  Physical Exam Vitals and nursing note reviewed.  Constitutional:      General: She is not in acute distress. HENT:     Head: Normocephalic and atraumatic.  Eyes:     Conjunctiva/sclera: Conjunctivae normal.     Pupils: Pupils are equal, round, and reactive to light.  Cardiovascular:     Rate and Rhythm: Normal rate and regular rhythm.     Heart sounds: Normal heart sounds.  Pulmonary:     Effort: Pulmonary effort is normal. No respiratory distress.     Breath sounds: Normal breath sounds.  Chest:     Comments: Chest wall with tenderness about the sternum as well as bilateral parasternal tenderness, no rash, reproduces the patient's pain Abdominal:     General: There is no distension.     Tenderness: There is no guarding.  Musculoskeletal:        General: No deformity or signs of injury.     Cervical back: Neck supple.  Skin:    Findings: No lesion or rash.  Neurological:     General: No focal deficit present.     Mental Status: She is alert. Mental status is at baseline.     ED Results / Procedures / Treatments   Labs (all labs ordered are listed, but only abnormal results are displayed) Labs Reviewed  CBC WITH DIFFERENTIAL/PLATELET - Abnormal; Notable for the following components:      Result Value   Eosinophils Absolute 0.6 (*)    Abs Immature Granulocytes 0.10 (*)    All other components within normal limits  BASIC METABOLIC PANEL - Abnormal; Notable for the following components:   Potassium 5.3 (*)    BUN 38 (*)    Creatinine, Ser 1.13 (*)    GFR, Estimated 50 (*)    All other components within normal limits  RESP  PANEL BY RT-PCR (RSV, FLU A&B, COVID)  [id number]  TROPONIN I (HIGH SENSITIVITY)    EKG EKG Interpretation Date/Time:  Tuesday June 17 2023 12:11:52 EST Ventricular Rate:  63 PR Interval:  137 QRS Duration:  79 QT Interval:  449 QTC Calculation: 460 R Axis:   -13  Text Interpretation: Sinus rhythm Abnormal R-wave progression, early transition Confirmed by Ernie Avena ([id number]) on 06/17/2023 12:13:15 PM  Radiology CT Chest W Contrast Result Date: 06/17/2023 CLINICAL DATA:  Chest trauma.  Blunt.  Fall. EXAM: CT CHEST WITH CONTRAST TECHNIQUE: Multidetector CT imaging of the chest was performed during intravenous contrast administration. RADIATION DOSE REDUCTION: This exam was  performed according to the departmental dose-optimization program which includes automated exposure control, adjustment of the mA and/or kV according to patient size and/or use of iterative reconstruction technique. CONTRAST:  80mL OMNIPAQUE IOHEXOL 300 MG/ML  SOLN COMPARISON:  CT angiography chest from 06/12/2023. FINDINGS: Cardiovascular: The examination was not performed as per pulmonary embolism protocol however, there is satisfactory opacification of the pulmonary artery branches up to the proximal subsegmental level. No evidence of embolism. Normal cardiac size. No pericardial effusion. No aortic aneurysm. There are coronary artery calcifications, in keeping with coronary artery disease. There are also mild peripheral atherosclerotic vascular calcifications of thoracic aorta and its major branches. Mediastinum/Nodes: Visualized thyroid gland appears grossly unremarkable. No solid / cystic mediastinal masses. The esophagus is nondistended precluding optimal assessment. No axillary, mediastinal or hilar lymphadenopathy by size criteria. Lungs/Pleura: The central tracheo-bronchial tree is patent. There are dependent subpleural atelectatic changes noted mainly in the right lung lower lobe. No mass or consolidation. No pleural  effusion or pneumothorax. No suspicious lung nodules. Upper Abdomen: There is a partially imaged at least 3.7 x 4.3 cm partially exophytic cyst arising from the left kidney interpolar region. There are several additional smaller subcentimeter sized structures in the left kidney, which are too small to adequately characterize. Remaining visualized upper abdominal viscera within normal limits. Musculoskeletal: The visualized soft tissues of the chest wall are grossly unremarkable. No suspicious osseous lesions. There are mild to moderate multilevel degenerative changes in the visualized spine. Mild anterior wedging deformity of T4 vertebral body noted. No significant retropulsion or spinal canal compromise. Upper lumbar spinal fixation hardware and right shoulder arthroplasty are noted. Review of the MIP images confirms the above findings. IMPRESSION: *No acute traumatic injury to the chest. Redemonstration of mild anterior wedging deformity of T4 vertebral body without interval change. No significant retropulsion or spinal canal compromise. *No lung mass, consolidation, lung contusion, pneumothorax or hemothorax. *Multiple other nonacute observations, as described above. Aortic Atherosclerosis (ICD10-I70.0). Electronically Signed   By: Jules Schick M.D.   On: 06/17/2023 13:05    Procedures Procedures    Medications Ordered in ED Medications  fentaNYL (SUBLIMAZE) injection 50 mcg (50 mcg Intravenous Given 06/17/23 1226)  iohexol (OMNIPAQUE) 300 MG/ML solution 100 mL (80 mLs Intravenous Contrast Given 06/17/23 1239)    ED Course/ Medical Decision Making/ A&P                                 Medical Decision Making Amount and/or Complexity of Data Reviewed Labs: ordered. Radiology: ordered.  Risk Prescription drug management.     77 year old female with medical history significant for anxiety, depression, GERD, IBS, ulcerative colitis, HLD, HTN, mild cognitive impairment who presents to the  emergency department with chest wall pain.  The patient was seen in the emergency department on 12/19 and 12/20 after a fall with a chief complaint of chest pain.  She had a negative chest pain workup and had imaging done to include CT imaging of the chest which had reportedly showed no evidence of sternal fracture.  The patient returns today because she has had persistent ongoing pain, worse with deep inspiration, feels like she may have fractured something.  Pain has been getting worse not better.  It is sharp, worse with any chest wall movement.  No new falls or trauma.  On arrival, the patient was vitally stable.  Physical exam revealed reproducible chest wall tenderness to palpation.  Considered  missed fracture, costochondritis.  Symptoms are bilateral, low concern for shingles.  Symptoms appear to be more musculoskeletal in nature and are reproducible to palpation on exam, low concern for cardiac etiology.  EKG: Sinus rhythm, ventricular rate 63, no acute ST changes to indicate ischemia.  Labs: BMP with only mild elevated potassium to 5.3, serum creatinine appears to be at baseline at 1.13, COVID and influenza PCR testing negative, single troponin elevated at 7, CBC without a leukocytosis or anemia.  CT chest: IMPRESSION:  *No acute traumatic injury to the chest. Redemonstration of mild  anterior wedging deformity of T4 vertebral body without interval  change. No significant retropulsion or spinal canal compromise.  *No lung mass, consolidation, lung contusion, pneumothorax or  hemothorax.  *Multiple other nonacute observations, as described above.    Aortic Atherosclerosis (ICD10-I70.0).    Symptoms consistent with musculoskeletal chest wall pain, mild inflammation, potential costochondritis.  Will discharge on a short course of pain medication, advised PCP follow-up.  Stable for discharge.   Final Clinical Impression(s) / ED Diagnoses Final diagnoses:  Musculoskeletal chest pain     Rx / DC Orders ED Discharge Orders          Ordered    HYDROcodone-acetaminophen (NORCO/VICODIN) 5-325 MG tablet  Every 6 hours PRN        06/17/23 1317              Ernie Avena, MD 06/17/23 1319

## 2023-06-24 ENCOUNTER — Telehealth: Payer: Self-pay | Admitting: Pulmonary Disease

## 2023-06-24 ENCOUNTER — Other Ambulatory Visit: Payer: Self-pay | Admitting: Cardiology

## 2023-06-24 ENCOUNTER — Other Ambulatory Visit: Payer: Self-pay | Admitting: Pulmonary Disease

## 2023-06-24 DIAGNOSIS — I2510 Atherosclerotic heart disease of native coronary artery without angina pectoris: Secondary | ICD-10-CM

## 2023-06-24 DIAGNOSIS — I700 Atherosclerosis of aorta: Secondary | ICD-10-CM

## 2023-06-24 DIAGNOSIS — I6523 Occlusion and stenosis of bilateral carotid arteries: Secondary | ICD-10-CM

## 2023-06-24 NOTE — Telephone Encounter (Signed)
Patient needs a refill of Symbicort.  Pharm[facility]: [facility] in [place]

## 2023-06-26 DIAGNOSIS — R0789 Other chest pain: Secondary | ICD-10-CM | POA: Diagnosis not present

## 2023-06-26 MED ORDER — BUDESONIDE-FORMOTEROL FUMARATE 160-4.5 MCG/ACT IN AERO: 2.0000 | INHALATION_SPRAY | Freq: Two times a day (BID) | RESPIRATORY_TRACT | 5 refills | Status: AC

## 2023-06-26 NOTE — Telephone Encounter (Signed)
 Symbicort refill sent to requested pharmacy 06/26/23.  Detailed message left on patient VM (DPR).  Nothing further at this time.

## 2023-07-02 DIAGNOSIS — F319 Bipolar disorder, unspecified: Secondary | ICD-10-CM | POA: Diagnosis not present

## 2023-07-02 DIAGNOSIS — F900 Attention-deficit hyperactivity disorder, predominantly inattentive type: Secondary | ICD-10-CM | POA: Diagnosis not present

## 2023-07-03 DIAGNOSIS — K5190 Ulcerative colitis, unspecified, without complications: Secondary | ICD-10-CM | POA: Diagnosis not present

## 2023-08-28 DIAGNOSIS — K5190 Ulcerative colitis, unspecified, without complications: Secondary | ICD-10-CM | POA: Diagnosis not present

## 2023-08-28 DIAGNOSIS — M0760 Enteropathic arthropathies, unspecified site: Secondary | ICD-10-CM | POA: Diagnosis not present

## 2023-08-28 DIAGNOSIS — M810 Age-related osteoporosis without current pathological fracture: Secondary | ICD-10-CM | POA: Diagnosis not present

## 2023-09-05 ENCOUNTER — Encounter: Payer: Self-pay | Admitting: Cardiology

## 2023-09-05 ENCOUNTER — Ambulatory Visit: Payer: Medicare Other | Attending: Cardiology | Admitting: Cardiology

## 2023-09-05 VITALS — BP 130/74 | HR 71 | Resp 16 | Wt 165.2 lb

## 2023-09-05 DIAGNOSIS — I350 Nonrheumatic aortic (valve) stenosis: Secondary | ICD-10-CM | POA: Diagnosis not present

## 2023-09-05 DIAGNOSIS — I2584 Coronary atherosclerosis due to calcified coronary lesion: Secondary | ICD-10-CM | POA: Insufficient documentation

## 2023-09-05 DIAGNOSIS — I10 Essential (primary) hypertension: Secondary | ICD-10-CM | POA: Diagnosis not present

## 2023-09-05 DIAGNOSIS — I2510 Atherosclerotic heart disease of native coronary artery without angina pectoris: Secondary | ICD-10-CM | POA: Diagnosis not present

## 2023-09-05 DIAGNOSIS — E782 Mixed hyperlipidemia: Secondary | ICD-10-CM | POA: Insufficient documentation

## 2023-09-05 DIAGNOSIS — R0602 Shortness of breath: Secondary | ICD-10-CM | POA: Insufficient documentation

## 2023-09-05 DIAGNOSIS — I6523 Occlusion and stenosis of bilateral carotid arteries: Secondary | ICD-10-CM | POA: Diagnosis not present

## 2023-09-05 DIAGNOSIS — I700 Atherosclerosis of aorta: Secondary | ICD-10-CM | POA: Diagnosis present

## 2023-09-05 DIAGNOSIS — I359 Nonrheumatic aortic valve disorder, unspecified: Secondary | ICD-10-CM | POA: Diagnosis not present

## 2023-09-05 NOTE — Progress Notes (Signed)
 Cardiology Office Note:  .   Date:  09/05/2023  ID:  [name], DOB 1945-12-15, MRN [id number] PCP:  Cleatis Polka., MD  Former Cardiology Providers: None [facility] Providers Cardiologist:  Tessa Lerner, DO , [facility] (established care 08/21/21  Electrophysiologist:  None  Click to update primary MD,subspecialty MD or APP then REFRESH:1}    Chief Complaint  Patient presents with   Follow-up    5-month follow-up Dyspnea Aortic stenosis    History of Present Illness: .   [name] is a 78 y.o. Caucasian female whose past medical history and cardiovascular risk factors includes: Coronary artery calcification, aortic atherosclerosis, carotid artery atherosclerosis, mild to moderate aortic stenosis, Hypothyroidism, hypertension, hyperlipidemia asthma, anxiety/depression, ADD, former smoker.   Patient was referred to the practice for evaluation of dyspnea back in 2023.  Has undergone appropriate cardiac workup and at her 1 year follow-up visit in September 2024 patient's dyspnea and exertion was chronic and stable.  However on physical examination the severity of aortic stenosis murmur appeared to be more pronounced and therefore repeat echocardiogram was requested.  Results reviewed with her in detail.  He denies anginal chest pain, heart failure symptoms, near-syncope or syncopal events.  Given her overall atherosclerotic burden which includes CAC, aortic atherosclerosis, and carotid disease patient was recommended to optimize her lipids.  She is on maximally tolerated dose of statin which is Crestor 10 mg p.o. daily.  She was started on Nexlizet at the last office visit.  She has not had any repeat labs to reevaluate her lipids.  But she is tolerating the medication well.  She denies anginal chest pain.  Overall functional capacity remains relatively stable  Review of Systems: .   Review of Systems  Cardiovascular:  Positive for dyspnea on  exertion (Chronic and stable). Negative for chest pain, claudication, irregular heartbeat, leg swelling, near-syncope, orthopnea, palpitations, paroxysmal nocturnal dyspnea and syncope.  Hematologic/Lymphatic: Negative for bleeding problem.    Studies Reviewed:   EKG 06/17/2023: Sinus rhythm, 63 bpm, without underlying ischemia or injury pattern.  Echocardiogram: 05/28/2023  1. Left ventricular ejection fraction, by estimation, is 60 to 65%. The left ventricle has normal function. The left ventricle has no regional wall motion abnormalities. There is mild left ventricular hypertrophy. Left ventricular diastolic parameters are consistent with Grade I diastolic dysfunction (impaired relaxation).   2. Right ventricular systolic function is normal. The right ventricular size is normal. There is normal pulmonary artery systolic pressure.   3. Left atrial size was mild to moderately dilated.   4. Trivial mitral valve regurgitation. Moderate mitral annular calcification.   5. The aortic valve is calcified. Aortic valve regurgitation is not visualized. Mild to moderate aortic valve stenosis.   6. The inferior vena cava is normal in size with greater than 50% respiratory variability, suggesting right atrial pressure of 3 mmHg.   Stress Testing: Lexiscan Nuclear stress test 10/24/2021: Low risk.   Carotid artery duplex 11/21/2022:  Duplex suggests stenosis in the right internal carotid artery (1-15%).  <50% stenosis in the left carotid vessels.  Antegrade right vertebral artery flow. Antegrade left vertebral artery flow.  Compared to the study done on 09/07/2021, there is regression of bilateral ICA stenosis from 16 to 49%.  There is mild heterogenous plaque noted in  bilateral carotid arteries.  Follow-up studies if clinically indicated.   RADIOLOGY: CT chest high-resolution: May 2024 1. No evidence of fibrotic interstitial lung disease. 2. Severe coronary artery calcifications. 3. Moderate  aortic Atherosclerosis (ICD10-I70.0).  Risk Assessment/Calculations:   NA   Labs:       Latest Ref Rng & Units 06/17/2023   12:22 PM 06/13/2023   11:07 AM 06/12/2023    4:04 PM  CBC  WBC 4.0 - 10.5 K/uL 9.5  11.5  21.1   Hemoglobin 12.0 - 15.0 g/dL 54.0  98.1  19.1   Hematocrit 36.0 - 46.0 % 38.5  35.0  36.1   Platelets 150 - 400 K/uL 275  265  295        Latest Ref Rng & Units 06/17/2023   12:22 PM 06/13/2023   11:07 AM 06/12/2023    4:04 PM  BMP  Glucose 70 - 99 mg/dL 91  478  98   BUN 8 - 23 mg/dL 38  37  34   Creatinine 0.44 - 1.00 mg/dL 2.95  6.21  3.08   Sodium 135 - 145 mmol/L 140  140  140   Potassium 3.5 - 5.1 mmol/L 5.3  4.3  4.7   Chloride 98 - 111 mmol/L 104  107  104   CO2 22 - 32 mmol/L 29  24  23    Calcium 8.9 - 10.3 mg/dL 9.5  9.8  65.7       Latest Ref Rng & Units 06/17/2023   12:22 PM 06/13/2023   11:07 AM 06/12/2023    4:04 PM  CMP  Glucose 70 - 99 mg/dL 91  846  98   BUN 8 - 23 mg/dL 38  37  34   Creatinine 0.44 - 1.00 mg/dL 9.62  9.52  8.41   Sodium 135 - 145 mmol/L 140  140  140   Potassium 3.5 - 5.1 mmol/L 5.3  4.3  4.7   Chloride 98 - 111 mmol/L 104  107  104   CO2 22 - 32 mmol/L 29  24  23    Calcium 8.9 - 10.3 mg/dL 9.5  9.8  32.4     Lab Results  Component Value Date   CHOL 259 (H) 03/30/2014   HDL 47.40 03/30/2014   LDLCALC 176 (H) 03/30/2014   LDLDIRECT 182.5 02/02/2008   TRIG 177.0 (H) 03/30/2014   CHOLHDL 5 03/30/2014   No results for input(s): "LIPOA" in the last 8760 hours. No components found for: "NTPROBNP" No results for input(s): "PROBNP" in the last 8760 hours. No results for input(s): "TSH" in the last 8760 hours.  External Labs: Collected: October 11, 2020 TSH 1.88   Collected: 08/08/2021: Hemoglobin 12.1 g/dL, hematocrit 40.1%. BUN 18, creatinine 0.8. Sodium 138, potassium 4.5, chloride 105, bicarb 22. AST 28, ALT 24, alkaline phosphatase 69 TSH 0.99   Collected 10/11/2020: Total cholesterol 165,  triglycerides 94, HDL 61, LDL 85, non-HDL 104   External Labs: Collected: Nov 21, 2022 available in Care Everywhere. Hemoglobin 12.3, hematocrit 40.3% Sodium 135, potassium 5.4, chloride 105, bicarb 23. AST 36, ALT 23, alkaline phosphatase 70. Serum creatinine 1.0 Total cholesterol 152, triglycerides 67, HDL 42, LDL 97, non-HDL 110 Apolipoprotein B 62 (within normal limits.)    Physical Exam:    Today's Vitals   09/05/23 1127  BP: 130/74  Pulse: 71  Resp: 16  SpO2: 97%  Weight: 165 lb 3.2 oz (74.9 kg)   Body mass index is 27.49 kg/m. Wt Readings from Last 3 Encounters:  09/05/23 165 lb 3.2 oz (74.9 kg)  06/17/23 149 lb 14.6 oz (68 kg)  06/13/23 149 lb 14.6 oz (68 kg)    Physical Exam  Constitutional: No distress.  Age appropriate, hemodynamically stable.   Neck: No JVD present.  Cardiovascular: Normal rate, regular rhythm, S1 normal, S2 normal, intact distal pulses and normal pulses. Exam reveals no gallop, no S3 and no S4.  Murmur heard. Systolic murmur is present with a grade of 3/6 at the upper right sternal border. Pulses:      Carotid pulses are  on the right side with bruit and  on the left side with bruit. Pulmonary/Chest: Effort normal and breath sounds normal. No stridor. She has no wheezes. She has no rales.  Abdominal: Soft. Bowel sounds are normal. She exhibits no distension. There is no abdominal tenderness.  Musculoskeletal:        General: No edema.     Cervical back: Neck supple.  Neurological: She is alert and oriented to person, place, and time. She has intact cranial nerves (2-12).  Skin: Skin is warm and moist.     Impression & Recommendation(s):  Impression:   ICD-10-CM   1. Aortic valve disease  I35.9 ECHOCARDIOGRAM COMPLETE    2. Nonrheumatic aortic valve stenosis  I35.0     3. Shortness of breath  R06.02     4. Atherosclerosis of aorta (HCC)  I70.0 CANCELED: EKG 12-Lead    5. Coronary atherosclerosis due to calcified coronary lesion   I25.10    I25.84     6. Atherosclerosis of both carotid arteries  I65.23     7. Mixed hyperlipidemia  E78.2 Comprehensive metabolic panel    Lipid panel    Lipid panel    Comprehensive metabolic panel    8. Essential hypertension  I10        Recommendation(s):  Aortic valve disease Nonrheumatic aortic valve stenosis Clinically remains asymptomatic. On physical examination pronounced systolic ejection murmur. Had an echocardiogram since last office visit which notes progression of aortic stenosis from mild to mild/moderate. Denies anginal chest pain, heart failure symptoms, near-syncope or syncopal events. Will repeat an echocardiogram in 1 year to reevaluate disease progression  Shortness of breath Multifactorial: Valvular heart disease, underlying asthma, deconditioning, etc. Has undergone ischemic workup as outlined above. Follows up with pulmonary medicine as well. Monitor for now  Atherosclerosis of aorta (HCC) Coronary atherosclerosis due to calcified coronary lesion Atherosclerosis of both carotid arteries Continue aspirin 81 mg p.o. daily Continue rosuvastatin 10 mg p.o. nightly Continue Nexlizet Reemphasized the importance of secondary prevention with focus on improving her modifiable cardiovascular risk factors such as glycemic control, lipid management, blood pressure control  Mixed hyperlipidemia Continue rosuvastatin 10 mg p.o. nightly Continue Nexlizet Will check fasting lipid profile, has not been performed after starting Nexlizet to evaluate therapy. Discussed reducing foods that are high in cholesterol and triglyceride content as well.  Essential hypertension Office blood pressures are very well-controlled. Currently on amlodipine/valsartan 5/160 mg p.o. daily.  Discussed management of at least 2 chronic comorbid conditions, independently reviewed EKG from December 2024, echocardiogram from December 2024, follow-up ordered labs as discussed above,  medication management.  Orders Placed:  Orders Placed This Encounter  Procedures   Comprehensive metabolic panel    Standing Status:   Future    Number of Occurrences:   1    Expected Date:   09/12/2023    Expiration Date:   09/04/2024   Lipid panel    Standing Status:   Future    Number of Occurrences:   1    Expected Date:   09/12/2023    Expiration Date:   09/04/2024  ECHOCARDIOGRAM COMPLETE    Standing Status:   Future    Expected Date:   09/04/2024    Expiration Date:   09/04/2024    Where should this test be performed:   [facility])    Does the patient weigh less than or greater than 250 lbs?:   Patient weighs less than 250 lbs    Perflutren DEFINITY (image enhancing agent) should be administered unless hypersensitivity or allergy exist:   Administer Perflutren    Reason for exam-Echo:   Other-Full Diagnosis List    Full ICD-10/Reason for Exam:   Aortic valve disease [211043]     Final Medication List:   No orders of the defined types were placed in this encounter.   There are no discontinued medications.   Current Outpatient Medications:    albuterol (VENTOLIN HFA) 108 (90 Base) MCG/ACT inhaler, Inhale 2 puffs into the lungs every 6 (six) hours as needed for wheezing or shortness of breath., Disp: 18 g, Rfl: 3   ALPRAZolam (XANAX) 0.25 MG tablet, Take 0.25 mg by mouth 2 (two) times daily as needed for anxiety or sleep., Disp: , Rfl:    amLODipine-valsartan (EXFORGE) 5-160 MG tablet, Take 1 tablet by mouth daily., Disp: 90 tablet, Rfl: 1   aspirin EC 81 MG tablet, Take 1 tablet (81 mg total) by mouth daily. Swallow whole., Disp: 30 tablet, Rfl: 11   Bempedoic Acid-Ezetimibe (NEXLIZET) 180-10 MG TABS, TAKE ONE TABLET DAILY AT 12 NOON, Disp: 90 tablet, Rfl: 2   budesonide-formoterol (SYMBICORT) 160-4.5 MCG/ACT inhaler, Inhale 2 puffs into the lungs 2 (two) times daily. in the morning and at bedtime., Disp: 10.2 g, Rfl: 5   buPROPion (WELLBUTRIN XL) 300 MG  24 hr tablet, Take 1 tablet (300 mg total) by mouth daily., Disp: 90 tablet, Rfl: 1   celecoxib (CELEBREX) 200 MG capsule, Take 200 mg by mouth daily., Disp: , Rfl:    Cholecalciferol (VITAMIN D PO), Take 1 tablet by mouth daily., Disp: , Rfl:    CONCERTA 36 MG CR tablet, Take 36 mg by mouth daily. , Disp: , Rfl:    FLUoxetine (PROZAC) 20 MG capsule, Take 3 capsules (60 mg total) by mouth daily. (Patient taking differently: Take 20 mg by mouth daily.), Disp: 90 capsule, Rfl: 3   HYDROcodone-acetaminophen (NORCO/VICODIN) 5-325 MG tablet, Take 1-2 tablets by mouth every 6 (six) hours as needed for severe pain (pain score 7-10)., Disp: 10 tablet, Rfl: 0   inFLIXimab (REMICADE) 100 MG injection, IV infusion every 2 months, Disp: , Rfl:    lamoTRIgine (LAMICTAL) 100 MG tablet, Take 100 mg by mouth 3 (three) times daily. , Disp: , Rfl:    methocarbamol (ROBAXIN) 500 MG tablet, Take 1 tablet (500 mg total) by mouth every 6 (six) hours as needed for muscle spasms., Disp: 40 tablet, Rfl: 0   pantoprazole (PROTONIX) 40 MG tablet, TAKE ONE TABLET TWICE DAILY (Patient taking differently: Take 40 mg by mouth 2 (two) times daily.), Disp: 60 tablet, Rfl: 11   rosuvastatin (CRESTOR) 10 MG tablet, Take 10 mg by mouth daily. , Disp: , Rfl:    Spacer/Aero-Holding Chambers (AEROCHAMBER MV) inhaler, Use as instructed, Disp: 1 each, Rfl: 0   SYNTHROID 50 MCG tablet, TAKE 2 TABLETS EVERY MORNING (Patient taking differently: Take 100 mcg by mouth daily before breakfast.), Disp: 180 tablet, Rfl: 1 No current facility-administered medications for this visit.  Facility-Administered Medications Ordered in Other Visits:    denosumab (PROLIA) injection 60  mg, 60 mg, Subcutaneous, Once, Cleatis Polka., MD  Consent:   NA  Disposition:   March 2026-aortic stenosis, status post echo  Her questions and concerns were addressed to her satisfaction. She voices understanding of the recommendations provided during this  encounter.    Signed, Tessa Lerner, DO, [facility]  [facility]  [address] [place], [place] [address] 09/05/2023 12:28 PM

## 2023-09-05 NOTE — Patient Instructions (Signed)
 Medication Instructions:  Your physician recommends that you continue on your current medications as directed. Please refer to the Current Medication list given to you today.  *If you need a refill on your cardiac medications before your next appointment, please call your pharmacy*  Lab Work: To be completed in 1 week: FASTING lipid panel and CMP  If you have labs (blood work) drawn today and your tests are completely normal, you will receive your results only by: MyChart Message (if you have MyChart) OR A paper copy in the mail If you have any lab test that is abnormal or we need to change your treatment, we will call you to review the results.  Testing/Procedures: Your physician has requested that you have an echocardiogram prior to your 1 year follow-up with Dr. Odis Hollingshead. Echocardiography is a painless test that uses sound waves to create images of your heart. It provides your doctor with information about the size and shape of your heart and how well your heart's chambers and valves are working. This procedure takes approximately one hour. There are no restrictions for this procedure. Please do NOT wear cologne, perfume, aftershave, or lotions (deodorant is allowed). Please arrive 15 minutes prior to your appointment time.  Please note: We ask at that you not bring children with you during ultrasound (echo/ vascular) testing. Due to room size and safety concerns, children are not allowed in the ultrasound rooms during exams. Our front office staff cannot provide observation of children in our lobby area while testing is being conducted. An adult accompanying a patient to their appointment will only be allowed in the ultrasound room at the discretion of the ultrasound technician under special circumstances. We apologize for any inconvenience.   Follow-Up: At [facility], you and your health needs are our priority.  As part of our continuing mission to provide you with exceptional heart care,  we have created designated Provider Care Teams.  These Care Teams include your primary Cardiologist (physician) and Advanced Practice Providers (APPs -  Physician Assistants and Nurse Practitioners) who all work together to provide you with the care you need, when you need it.  We recommend signing up for the patient portal called "MyChart".  Sign up information is provided on this After Visit Summary.  MyChart is used to connect with patients for Virtual Visits (Telemedicine).  Patients are able to view lab/test results, encounter notes, upcoming appointments, etc.  Non-urgent messages can be sent to your provider as well.   To learn more about what you can do with MyChart, go to [email or web address].    Your next appointment:   1 year(s)  The format for your next appointment:   In Person  Provider:   Tessa Lerner, DO {

## 2023-09-16 ENCOUNTER — Other Ambulatory Visit: Payer: Self-pay

## 2023-09-16 ENCOUNTER — Emergency Department (HOSPITAL_BASED_OUTPATIENT_CLINIC_OR_DEPARTMENT_OTHER)
Admission: EM | Admit: 2023-09-16 | Discharge: 2023-09-16 | Disposition: A | Discharge status: Home / Self Care | Attending: Emergency Medicine | Admitting: Emergency Medicine

## 2023-09-16 ENCOUNTER — Other Ambulatory Visit (HOSPITAL_BASED_OUTPATIENT_CLINIC_OR_DEPARTMENT_OTHER): Payer: Self-pay

## 2023-09-16 ENCOUNTER — Encounter (HOSPITAL_BASED_OUTPATIENT_CLINIC_OR_DEPARTMENT_OTHER): Payer: Self-pay

## 2023-09-16 ENCOUNTER — Emergency Department (HOSPITAL_BASED_OUTPATIENT_CLINIC_OR_DEPARTMENT_OTHER)

## 2023-09-16 DIAGNOSIS — Z79899 Other long term (current) drug therapy: Secondary | ICD-10-CM | POA: Diagnosis not present

## 2023-09-16 DIAGNOSIS — I10 Essential (primary) hypertension: Secondary | ICD-10-CM | POA: Diagnosis not present

## 2023-09-16 DIAGNOSIS — L03116 Cellulitis of left lower limb: Secondary | ICD-10-CM | POA: Insufficient documentation

## 2023-09-16 DIAGNOSIS — Z87891 Personal history of nicotine dependence: Secondary | ICD-10-CM | POA: Diagnosis not present

## 2023-09-16 DIAGNOSIS — M7989 Other specified soft tissue disorders: Secondary | ICD-10-CM | POA: Diagnosis not present

## 2023-09-16 DIAGNOSIS — M19072 Primary osteoarthritis, left ankle and foot: Secondary | ICD-10-CM | POA: Diagnosis not present

## 2023-09-16 DIAGNOSIS — M25572 Pain in left ankle and joints of left foot: Secondary | ICD-10-CM | POA: Diagnosis not present

## 2023-09-16 LAB — CBC
HCT: 35.1 % — ABNORMAL LOW (ref 36.0–46.0)
Hemoglobin: 11.3 g/dL — ABNORMAL LOW (ref 12.0–15.0)
MCH: 31.3 pg (ref 26.0–34.0)
MCHC: 32.2 g/dL (ref 30.0–36.0)
MCV: 97.2 fL (ref 80.0–100.0)
Platelets: 221 10*3/uL (ref 150–400)
RBC: 3.61 MIL/uL — ABNORMAL LOW (ref 3.87–5.11)
RDW: 13.2 % (ref 11.5–15.5)
WBC: 6.8 10*3/uL (ref 4.0–10.5)
nRBC: 0 % (ref 0.0–0.2)

## 2023-09-16 LAB — BASIC METABOLIC PANEL
Anion gap: 9 (ref 5–15)
BUN: 21 mg/dL (ref 8–23)
CO2: 23 mmol/L (ref 22–32)
Calcium: 9.2 mg/dL (ref 8.9–10.3)
Chloride: 108 mmol/L (ref 98–111)
Creatinine, Ser: 1.03 mg/dL — ABNORMAL HIGH (ref 0.44–1.00)
Glucose, Bld: 103 mg/dL — ABNORMAL HIGH (ref 70–99)
Potassium: 4.2 mmol/L (ref 3.5–5.1)
Sodium: 140 mmol/L (ref 135–145)

## 2023-09-16 MED ORDER — CEPHALEXIN 250 MG PO CAPS
500.0000 mg | ORAL_CAPSULE | Freq: Once | ORAL | Status: AC
  Administered 2023-09-16: 500 mg via ORAL
  Filled 2023-09-16: qty 2

## 2023-09-16 MED ORDER — BACITRACIN ZINC 500 UNIT/GM EX OINT
1.0000 | TOPICAL_OINTMENT | Freq: Two times a day (BID) | CUTANEOUS | 0 refills | Status: DC
Start: 2023-09-16 — End: 2024-03-12

## 2023-09-16 MED ORDER — CEFADROXIL 500 MG PO CAPS: 500.0000 mg | ORAL_CAPSULE | Freq: Two times a day (BID) | ORAL | 0 refills | Status: DC

## 2023-09-16 NOTE — Discharge Instructions (Signed)
 As discussed, suspect that the area on your ankle is consistent with bacterial skin infection or cellulitis.  Will place on oral antibiotics for this.  Regarding your wound on your right shin area, we will send an antibiotic ointment to place over area.  Recommend follow-up with your primary care for reassessment.  If you develop fever, worsening pain, worsening redness, pus drainage, please return for repeat assessment.

## 2023-09-16 NOTE — ED Provider Notes (Signed)
 [place] EMERGENCY DEPARTMENT AT [facility] Provider Note   CSN: [id number] Arrival date & time: 09/16/23  1022     History  Chief Complaint  Patient presents with   Ankle Pain    [name] is a 78 y.o. female.   Ankle Pain   78 year old female presents emergency department with complaints of left ankle pain, redness.  Report noticing symptoms over the past couple of days.  Denies any known trauma to area but does state that she has been gardening more frequently over the past 2 to 3 weeks with potential trauma to foot/ankle.  States the area became red over the past day and a half with more pain prompting visit emergency department.  Denies any fevers, chills, weakness/sensory deficits in affected leg.  States that she has been ambulating on her foot without difficulty.  Patient is on immunosuppressive medication secondary to RA.  Denies history of gout.  Past medical history significant for osteoarthritis, ulcerative colitis, thyroid disease, ADHD, diverticulosis, hypertension  Home Medications Prior to Admission medications   Medication Sig Start Date End Date Taking? Authorizing Provider  albuterol (VENTOLIN HFA) 108 (90 Base) MCG/ACT inhaler Inhale 2 puffs into the lungs every 6 (six) hours as needed for wheezing or shortness of breath. 11/09/21   Mannam, Colbert Coyer, MD  ALPRAZolam (XANAX) 0.25 MG tablet Take 0.25 mg by mouth 2 (two) times daily as needed for anxiety or sleep. 02/19/19   [provider]  amLODipine-valsartan (EXFORGE) 5-160 MG tablet Take 1 tablet by mouth daily. 05/11/18   Plotnikov, Georgina Quint, MD  aspirin EC 81 MG tablet Take 1 tablet (81 mg total) by mouth daily. Swallow whole. 09/18/21   Tolia, Sunit, DO  Bempedoic Acid-Ezetimibe (NEXLIZET) 180-10 MG TABS TAKE ONE TABLET DAILY AT 12 NOON 06/24/23   Tolia, Sunit, DO  budesonide-formoterol (SYMBICORT) 160-4.5 MCG/ACT inhaler Inhale 2 puffs into the lungs 2 (two) times daily. in  the morning and at bedtime. 06/26/23   Mannam, Colbert Coyer, MD  buPROPion (WELLBUTRIN XL) 300 MG 24 hr tablet Take 1 tablet (300 mg total) by mouth daily. 12/22/14   Plotnikov, Georgina Quint, MD  celecoxib (CELEBREX) 200 MG capsule Take 200 mg by mouth daily. 09/13/20   [provider]  Cholecalciferol (VITAMIN D PO) Take 1 tablet by mouth daily.    [provider]  CONCERTA 36 MG CR tablet Take 36 mg by mouth daily.  12/06/14   [provider]  FLUoxetine (PROZAC) 20 MG capsule Take 3 capsules (60 mg total) by mouth daily. Patient taking differently: Take 20 mg by mouth daily. 04/23/17   Plotnikov, Georgina Quint, MD  HYDROcodone-acetaminophen (NORCO/VICODIN) 5-325 MG tablet Take 1-2 tablets by mouth every 6 (six) hours as needed for severe pain (pain score 7-10). 06/17/23   Ernie Avena, MD  inFLIXimab (REMICADE) 100 MG injection IV infusion every 2 months 06/10/18   [provider]  lamoTRIgine (LAMICTAL) 100 MG tablet Take 100 mg by mouth 3 (three) times daily.  05/31/15   [provider]  methocarbamol (ROBAXIN) 500 MG tablet Take 1 tablet (500 mg total) by mouth every 6 (six) hours as needed for muscle spasms. 07/07/18   Lanney Gins, PA-C  pantoprazole (PROTONIX) 40 MG tablet TAKE ONE TABLET TWICE DAILY Patient taking differently: Take 40 mg by mouth 2 (two) times daily. 01/13/18   Plotnikov, Georgina Quint, MD  rosuvastatin (CRESTOR) 10 MG tablet Take 10 mg by mouth daily.     [provider]  Spacer/Aero-Holding Chambers (AEROCHAMBER MV) inhaler Use as instructed 11/09/21   Mannam, Colbert Coyer, MD  SYNTHROID 50 MCG tablet TAKE 2 TABLETS EVERY MORNING Patient taking differently: Take 100 mcg by mouth daily before breakfast. 10/01/18   Plotnikov, Georgina Quint, MD  losartan-hydrochlorothiazide (HYZAAR) 50-12.5 MG per tablet Take 1 tablet by mouth daily.  09/04/11  [provider]  simvastatin (ZOCOR) 20 MG tablet Take 20 mg by mouth every evening.  09/04/11   [provider]      Allergies    Ampicillin, Bee pollen, Erythromycin, Hctz [hydrochlorothiazide], Metoprolol, Molds & smuts, Morphine and codeine, and Penicillin g    Review of Systems   Review of Systems  All other systems reviewed and are negative.   Physical Exam Updated Vital Signs BP 137/60 (BP Location: Left Arm)   Pulse 75   Temp 98.8 F (37.1 C)   Resp 17   Ht 5\' 5"  (1.651 m)   Wt 72.6 kg   SpO2 99%   BMI 26.63 kg/m  Physical Exam Vitals and nursing note reviewed.  Constitutional:      General: She is not in acute distress.    Appearance: She is well-developed.  HENT:     Head: Normocephalic and atraumatic.  Eyes:     Conjunctiva/sclera: Conjunctivae normal.  Cardiovascular:     Rate and Rhythm: Normal rate and regular rhythm.  Pulmonary:     Effort: Pulmonary effort is normal. No respiratory distress.     Breath sounds: Normal breath sounds.  Abdominal:     Palpations: Abdomen is soft.     Tenderness: There is no abdominal tenderness.  Musculoskeletal:        General: No swelling.     Cervical back: Neck supple.     Comments: Patient with localized swelling, erythema anterior medial aspect of left ankle.  Pin-hole size wound appreciated at border of erythema.  Area tender to the touch, palpably warm.  Full range of motion of left ankle/digits without reported pain.  Patient ambulates in the room with some discomfort of the left ankle.  Pedal and posterior tibial pulses 2+ bilateral.  No obvious bony tenderness to palpation of left foot/ankle.  No appreciable palpable fluctuance.  Skin:    General: Skin is warm and dry.     Capillary Refill: Capillary refill takes less than 2 seconds.  Neurological:     Mental Status: She is alert.  Psychiatric:        Mood and Affect: Mood normal.        ED Results / Procedures / Treatments   Labs (all labs ordered are listed, but only abnormal results are displayed) Labs Reviewed - No data to  display  EKG None  Radiology No results found.  Procedures Procedures    Medications Ordered in ED Medications - No data to display  ED Course/ Medical Decision Making/ A&P                                 Medical Decision Making Amount and/or Complexity of Data Reviewed Labs: ordered. Radiology: ordered.  Risk OTC drugs. Prescription drug management.   This patient presents to the ED for concern of ankle pain, this involves an extensive number of treatment options, and is a complaint that carries with it a high risk of complications and morbidity.  The differential diagnosis includes fracture, strain/pain, dislocation, septic arthritis, gout, osteoarthritis, cellulitis, erysipelas, abscess, necrotizing infection, ischemic  limb, other   Co morbidities that complicate the patient evaluation  See HPI   Additional history obtained:  Additional history obtained from EMR External records from outside source obtained and reviewed including hospital records   Lab Tests:  I Ordered, and personally interpreted labs.  The pertinent results include: No leukocytosis.  Anemia with a hemoglobin 11.3.  Platelets within range.  No Electra abnormalities.  Baseline creatinine 1.03.   Imaging Studies ordered:  I ordered imaging studies including left ankle x-ray I independently visualized and interpreted imaging which showed no acute fracture/dislocation.  Degenerative changes in ankle/hindfoot.  Thickening of inferior Achilles tendon with calcifications. I agree with the radiologist interpretation  Cardiac Monitoring: / EKG:  The patient was maintained on a cardiac monitor.  I personally viewed and interpreted the cardiac monitored which showed an underlying rhythm of: sinus rhythm   Consultations Obtained:  N/a   Problem List / ED Course / Critical interventions / Medication management  Cellulitis I ordered medication including keflex    Reevaluation of the patient  after these medicines showed that the patient improved I have reviewed the patients home medicines and have made adjustments as needed   Social Determinants of Health:  Former cigarette use.  Denies illicit drug use.   Test / Admission - Considered:  Cellulitis Vitals signs within normal range and stable throughout visit. Laboratory/imaging studies significant for: See above 78 year old female presents emergency department with complaints of left ankle pain, redness.  Report noticing symptoms over the past couple of days.  Denies any known trauma to area but does state that she has been gardening more frequently over the past 2 to 3 weeks with potential trauma to foot/ankle.  States the area became red over the past day and a half with more pain prompting visit emergency department.  Denies any fevers, chills, weakness/sensory deficits in affected leg.  States that she has been ambulating on her foot without difficulty.  Patient is on immunosuppressive medication secondary to RA.  Denies history of gout. On exam, erythema appreciated on anterior medial aspect of left ankle with pin sized opening in skin.  No pulse deficits to suggest ischemic limb.  No traumatic injury/bony tenderness concerning for osseous abnormality.  X-ray obtained which was negative for any acute abnormality.  Labs reassuring.  Suspect the patient has localized cellulitis from superficial wound and skin.  Will treat with antibiotics and recommend follow-up with PCP in the outpatient setting.  Treatment plan discussed at length with patient and she acknowledged understanding was agreeable to said plan.  Patient overall well-appearing, afebrile in no acute distress. Worrisome signs and symptoms were discussed with the patient, and the patient acknowledged understanding to return to the ED if noticed. Patient was stable upon discharge.          Final Clinical Impression(s) / ED Diagnoses Final diagnoses:  None    Rx /  DC Orders ED Discharge Orders     None         Peter Garter, [place] 09/16/23 1507    Rozelle Logan, DO 09/17/23 [id number]

## 2023-09-16 NOTE — ED Triage Notes (Signed)
 In for eval of erythema, pain, and swelling to left ankle. Denies injury. Onset yesterday.

## 2023-09-23 DIAGNOSIS — F3342 Major depressive disorder, recurrent, in full remission: Secondary | ICD-10-CM | POA: Diagnosis not present

## 2023-09-23 DIAGNOSIS — D849 Immunodeficiency, unspecified: Secondary | ICD-10-CM | POA: Diagnosis not present

## 2023-09-23 DIAGNOSIS — G3184 Mild cognitive impairment, so stated: Secondary | ICD-10-CM | POA: Diagnosis not present

## 2023-09-23 DIAGNOSIS — I129 Hypertensive chronic kidney disease with stage 1 through stage 4 chronic kidney disease, or unspecified chronic kidney disease: Secondary | ICD-10-CM | POA: Diagnosis not present

## 2023-09-23 DIAGNOSIS — I2510 Atherosclerotic heart disease of native coronary artery without angina pectoris: Secondary | ICD-10-CM | POA: Diagnosis not present

## 2023-09-23 DIAGNOSIS — F411 Generalized anxiety disorder: Secondary | ICD-10-CM | POA: Diagnosis not present

## 2023-09-23 DIAGNOSIS — K5190 Ulcerative colitis, unspecified, without complications: Secondary | ICD-10-CM | POA: Diagnosis not present

## 2023-09-23 DIAGNOSIS — N1831 Chronic kidney disease, stage 3a: Secondary | ICD-10-CM | POA: Diagnosis not present

## 2023-09-23 DIAGNOSIS — J45909 Unspecified asthma, uncomplicated: Secondary | ICD-10-CM | POA: Diagnosis not present

## 2023-09-23 DIAGNOSIS — R7301 Impaired fasting glucose: Secondary | ICD-10-CM | POA: Diagnosis not present

## 2023-09-23 DIAGNOSIS — E785 Hyperlipidemia, unspecified: Secondary | ICD-10-CM | POA: Diagnosis not present

## 2023-09-23 DIAGNOSIS — I10 Essential (primary) hypertension: Secondary | ICD-10-CM | POA: Diagnosis not present

## 2023-09-23 DIAGNOSIS — I700 Atherosclerosis of aorta: Secondary | ICD-10-CM | POA: Diagnosis not present

## 2023-09-23 LAB — COMPREHENSIVE METABOLIC PANEL WITH GFR: EGFR: 48.2

## 2023-09-30 DIAGNOSIS — F3175 Bipolar disorder, in partial remission, most recent episode depressed: Secondary | ICD-10-CM | POA: Diagnosis not present

## 2023-10-01 DIAGNOSIS — F319 Bipolar disorder, unspecified: Secondary | ICD-10-CM | POA: Diagnosis not present

## 2023-10-01 DIAGNOSIS — F900 Attention-deficit hyperactivity disorder, predominantly inattentive type: Secondary | ICD-10-CM | POA: Diagnosis not present

## 2023-11-06 DIAGNOSIS — Z79899 Other long term (current) drug therapy: Secondary | ICD-10-CM | POA: Diagnosis not present

## 2023-11-06 DIAGNOSIS — M810 Age-related osteoporosis without current pathological fracture: Secondary | ICD-10-CM | POA: Diagnosis not present

## 2023-11-06 DIAGNOSIS — M1990 Unspecified osteoarthritis, unspecified site: Secondary | ICD-10-CM | POA: Diagnosis not present

## 2023-11-06 DIAGNOSIS — M79643 Pain in unspecified hand: Secondary | ICD-10-CM | POA: Diagnosis not present

## 2023-11-06 DIAGNOSIS — M25569 Pain in unspecified knee: Secondary | ICD-10-CM | POA: Diagnosis not present

## 2023-11-06 DIAGNOSIS — M0760 Enteropathic arthropathies, unspecified site: Secondary | ICD-10-CM | POA: Diagnosis not present

## 2023-11-06 DIAGNOSIS — K5190 Ulcerative colitis, unspecified, without complications: Secondary | ICD-10-CM | POA: Diagnosis not present

## 2023-11-14 DIAGNOSIS — M170 Bilateral primary osteoarthritis of knee: Secondary | ICD-10-CM | POA: Diagnosis not present

## 2023-11-18 ENCOUNTER — Telehealth: Payer: Self-pay

## 2023-11-18 NOTE — Telephone Encounter (Signed)
   Pre-operative Risk Assessment    Patient Name: [name] [facility]  DOB: [birth year] MRN: [id number]   Date of last office visit: 09/05/23 [name], DO Date of next office visit: NONE   Request for Surgical Clearance    Procedure:  RIGHT TOTAL KNEE ARTHROPLASTY  Date of Surgery:  Clearance 02/10/24                                Surgeon:  DR Claiborne Crew Surgeon's Group or Practice Name:  [facility] Phone number:  [phone] Fax number:  [phone]  ATTN: Valinda Gault WILLS   Type of Clearance Requested:   - Medical  - Pharmacy:  Hold Aspirin      Type of Anesthesia:  Spinal   Additional requests/questions:    SignedCollin Deal   11/18/2023, 5:39 PM

## 2023-11-19 NOTE — Telephone Encounter (Signed)
 Left message for pt to call our office and ask for the Preop team to schedule TELE Preop Appt.

## 2023-11-19 NOTE — Telephone Encounter (Signed)
   Name: Sierrah Luevano [facility]  DOB: 1945/09/30  MRN: [id number]  Primary Cardiologist: [name], DO   Preoperative team, please contact this patient and set up a phone call appointment for further preoperative risk assessment. Please obtain consent and complete medication review. Thank you for your help.  I confirm that guidance regarding antiplatelet and oral anticoagulation therapy has been completed and, if necessary, noted below.  I also confirmed the patient resides in the state of [place] . As per [facility] telemedicine laws, the patient must reside in the state in which the provider is licensed.  Regarding ASA therapy, we recommend continuation of ASA throughout the perioperative period.  However, if the surgeon feels that cessation of ASA is required in the perioperative period, it may be stopped 5-7 days prior to surgery with a plan to resume it as soon as felt to be feasible from a surgical standpoint in the post-operative period.   Leala Prince, PA-C 11/19/2023, 8:36 AM [facility]

## 2023-11-20 NOTE — Telephone Encounter (Signed)
 2nd attempt to reach the pt to schedule tele preop appt.

## 2023-11-21 NOTE — Telephone Encounter (Signed)
 3rd attempt to reach the pt to schedule a tele preop appt in late July. I will update the requesting office to let the pt needs to call the office to set up tele visit for preop clearance.   I will remove from the preop call back at this time.

## 2023-11-24 ENCOUNTER — Telehealth: Payer: Self-pay

## 2023-11-24 NOTE — Telephone Encounter (Signed)
 S/W pt and schedule TELE Preop appt 12/08/23. Med Rec and consent done   Will update surgeons office.

## 2023-11-24 NOTE — Telephone Encounter (Signed)
 Med Rec and consent done     Patient Consent for Virtual Visit        [name] has provided verbal consent on 11/24/2023 for a virtual visit (video or telephone).   CONSENT FOR VIRTUAL VISIT FOR:  [name]  By participating in this virtual visit I agree to the following:  I hereby voluntarily request, consent and authorize [facility] and its employed or contracted physicians, physician assistants, nurse practitioners or other licensed health care professionals (the Practitioner), to provide me with telemedicine health care services (the "Services") as deemed necessary by the treating Practitioner. I acknowledge and consent to receive the Services by the Practitioner via telemedicine. I understand that the telemedicine visit will involve communicating with the Practitioner through live audiovisual communication technology and the disclosure of certain medical information by electronic transmission. I acknowledge that I have been given the opportunity to request an in-person assessment or other available alternative prior to the telemedicine visit and am voluntarily participating in the telemedicine visit.  I understand that I have the right to withhold or withdraw my consent to the use of telemedicine in the course of my care at any time, without affecting my right to future care or treatment, and that the Practitioner or I may terminate the telemedicine visit at any time. I understand that I have the right to inspect all information obtained and/or recorded in the course of the telemedicine visit and may receive copies of available information for a reasonable fee.  I understand that some of the potential risks of receiving the Services via telemedicine include:  Delay or interruption in medical evaluation due to technological equipment failure or disruption; Information transmitted may not be sufficient (e.g. poor resolution of images) to allow for  appropriate medical decision making by the Practitioner; and/or  In rare instances, security protocols could fail, causing a breach of personal health information.  Furthermore, I acknowledge that it is my responsibility to provide information about my medical history, conditions and care that is complete and accurate to the best of my ability. I acknowledge that Practitioner's advice, recommendations, and/or decision may be based on factors not within their control, such as incomplete or inaccurate data provided by me or distortions of diagnostic images or specimens that may result from electronic transmissions. I understand that the practice of medicine is not an exact science and that Practitioner makes no warranties or guarantees regarding treatment outcomes. I acknowledge that a copy of this consent can be made available to me via my patient portal [facility] MyChart), or I can request a printed copy by calling the office of [facility].    I understand that my insurance will be billed for this visit.   I have read or had this consent read to me. I understand the contents of this consent, which adequately explains the benefits and risks of the Services being provided via telemedicine.  I have been provided ample opportunity to ask questions regarding this consent and the Services and have had my questions answered to my satisfaction. I give my informed consent for the services to be provided through the use of telemedicine in my medical care

## 2023-11-24 NOTE — Telephone Encounter (Signed)
 Pt returning call

## 2023-12-02 DIAGNOSIS — M25569 Pain in unspecified knee: Secondary | ICD-10-CM | POA: Diagnosis not present

## 2023-12-08 ENCOUNTER — Ambulatory Visit (INDEPENDENT_AMBULATORY_CARE_PROVIDER_SITE_OTHER)

## 2023-12-08 DIAGNOSIS — Z01810 Encounter for preprocedural cardiovascular examination: Secondary | ICD-10-CM

## 2023-12-08 NOTE — Telephone Encounter (Signed)
 Per Morey Ar, NP pt was not able to complete TELE appt today.  Pt will need to be R/S for TELE Preop appt.

## 2023-12-08 NOTE — Progress Notes (Signed)
   Virtual Visit via Telephone Note   Because of [name]'s co-morbid illnesses, she is at least at moderate risk for complications without adequate follow up.  This format is felt to be most appropriate for this patient at this time.  The patient did not have access to video technology/had technical difficulties with video requiring transitioning to audio format only (telephone).  All issues noted in this document were discussed and addressed.  No physical exam could be performed with this format.  Please refer to the patient's chart for her consent to telehealth for [facility].  Evaluation Performed:  Preoperative cardiovascular risk assessment _____________   Date:  12/08/2023   Patient ID:  [name], DOB [birth year], MRN [id number] Patient Location:  Home Provider location:   Office  Primary Care Provider:  Jeannine Milroy., MD Primary Cardiologist:  [name], DO   Contacted patient for telephone visit. She states she changed her surgery date. She was getting on a plane and could not complete visit. Will have pre-op team contact her to reschedule.    Morey Ar, NP  12/08/2023, 10:23 AM

## 2023-12-08 NOTE — Telephone Encounter (Signed)
 Left message for pt to call back to reschedule her tele preop appt.

## 2023-12-11 NOTE — Telephone Encounter (Signed)
 2nd attempt contacting patient to reschedule tele preop appt no answer left a detailed vm to call back and schedule

## 2023-12-12 NOTE — Telephone Encounter (Signed)
 3rd attempt to schedule TELE Preop appt. Left message for pt to call our office and ask to speak with the preop team.

## 2023-12-12 NOTE — Telephone Encounter (Signed)
 Will update surgeons office  that patient still need TELE Preop appt. Pt as of 12/12/23 is NOT Scheduled for TELE Preop appt.

## 2023-12-17 ENCOUNTER — Telehealth: Payer: Self-pay | Admitting: Cardiology

## 2023-12-17 NOTE — Telephone Encounter (Signed)
 Patient calling to get her pre opp reschedule. Please advise

## 2023-12-17 NOTE — Telephone Encounter (Signed)
 I called patient to schedule telephone appt for preop clearance no answer left a detailed vm to call back and schedule. We have been constantly contacting patient since 6/16 to get patient rescheduled will try again at a later time

## 2023-12-18 NOTE — Telephone Encounter (Signed)
 S/W pt and R/S TELE Preop appt for 01/19/24  Pt stated that the new date for her procedure is 03/09/24.   Will update surgeons office.

## 2023-12-23 DIAGNOSIS — M25569 Pain in unspecified knee: Secondary | ICD-10-CM | POA: Diagnosis not present

## 2023-12-25 DIAGNOSIS — M25569 Pain in unspecified knee: Secondary | ICD-10-CM | POA: Diagnosis not present

## 2023-12-30 DIAGNOSIS — M25569 Pain in unspecified knee: Secondary | ICD-10-CM | POA: Diagnosis not present

## 2023-12-31 DIAGNOSIS — F900 Attention-deficit hyperactivity disorder, predominantly inattentive type: Secondary | ICD-10-CM | POA: Diagnosis not present

## 2023-12-31 DIAGNOSIS — F319 Bipolar disorder, unspecified: Secondary | ICD-10-CM | POA: Diagnosis not present

## 2024-01-01 DIAGNOSIS — M25569 Pain in unspecified knee: Secondary | ICD-10-CM | POA: Diagnosis not present

## 2024-01-02 DIAGNOSIS — M542 Cervicalgia: Secondary | ICD-10-CM | POA: Diagnosis not present

## 2024-01-07 DIAGNOSIS — K5190 Ulcerative colitis, unspecified, without complications: Secondary | ICD-10-CM | POA: Diagnosis not present

## 2024-01-08 DIAGNOSIS — M25569 Pain in unspecified knee: Secondary | ICD-10-CM | POA: Diagnosis not present

## 2024-01-15 DIAGNOSIS — M542 Cervicalgia: Secondary | ICD-10-CM | POA: Diagnosis not present

## 2024-01-19 ENCOUNTER — Ambulatory Visit: Attending: Internal Medicine | Admitting: Student

## 2024-01-19 DIAGNOSIS — Z01810 Encounter for preprocedural cardiovascular examination: Secondary | ICD-10-CM

## 2024-01-19 NOTE — Progress Notes (Signed)
 Virtual Visit via Telephone Note   Because of [name]'s co-morbid illnesses, she is at least at moderate risk for complications without adequate follow up.  This format is felt to be most appropriate for this patient at this time.  The patient did not have access to video technology/had technical difficulties with video requiring transitioning to audio format only (telephone).  All issues noted in this document were discussed and addressed.  No physical exam could be performed with this format.  Please refer to the patient's chart for her consent to telehealth for [name].  Evaluation Performed:  Preoperative cardiovascular risk assessment _____________   Date:  01/19/2024   Patient ID:  [name], DOB Jun 06, 1946, MRN [id number] Patient Location:  Home Provider location:   Office  Primary Care Provider:  [name], [name] Primary Cardiologist:  Tammy Large, [name]  Chief Complaint / Patient Profile   78 y.o. y/o female with a h/o coronary artery calcification/aortic atherosclerosis, carotid artery atherosclerosis, mild to moderate aortic stenosis, hypertension, hyperlipidemia, asthma, GERD, hypothyroidism who is pending right total knee arthroplasty by Dr. Ernie and presents today for telephonic preoperative cardiovascular risk assessment.  History of Present Illness    [name] is a 78 y.o. female who presents via audio/video conferencing for a telehealth visit today.  Pt was last seen in cardiology clinic on 09/05/2023 by Dr. Large.  At that time [name] was stable from a cardiac standpoint.  The patient is now pending procedure as outlined above. Since her last visit, she is doing well. Patient denies shortness of breath, dyspnea on exertion, lower extremity edema, orthopnea or PND. No chest pain, pressure, or tightness. No palpitations.  No lightheadedness or dizziness. Her activity is limited by knee  pain. Patient ambulates with a walker. She does some walking around her home. She performs seated exercises every other day. She is also attending physical therapy twice a week.   Past Medical History    Past Medical History:  Diagnosis Date   ADD (attention deficit disorder)    Allergy    Anxiety    Asthma    Attention deficit disorder without mention of hyperactivity    Back pain    Colitis 12/05/2010   Colon polyps    Complication of anesthesia    problem with grogginess and a long time to get over Anesthesia   Depression    Diverticulosis    GERD (gastroesophageal reflux disease)    H/O hiatal hernia    Hypertension    IBS (irritable bowel syndrome)    MCI (mild cognitive impairment)    Memory changes    Osteoarthrosis, unspecified whether generalized or localized, unspecified site    Other and unspecified hyperlipidemia    Renal cyst 10/2015   simple left   Repeated falls    Thyroid  disease    Ulcerative colitis    Unspecified asthma(493.90)    Unspecified essential hypertension    taken off meds since lost wt   Past Surgical History:  Procedure Laterality Date   ABDOMINAL EXPOSURE N/A 04/22/2014   Procedure: ABDOMINAL EXPOSURE;  Surgeon: Lonni GORMAN Blade, [name];  Location: [facility];  Service: Vascular;  Laterality: N/A;   ANTERIOR LATERAL LUMBAR FUSION 4 LEVELS Left 04/22/2014   Procedure: Left Lumbar one/two, two/three, three/four, four,five. Anterior lateral lumbar interbody fusion**Stage 1**;  Surgeon: Fairy Levels, [name];  Location: [facility];  Service: Neurosurgery;  Laterality: Left;   ANTERIOR LUMBAR FUSION  N/A 04/22/2014   Procedure: Lumbar five/-Sacral one. Anterior lumbar interbody fusion with Dr. Eliza; Left Lumbar one/two, two/three/three/four, four/five. Anterior lateral lumbar interbody fusion**Stage 1**;  Surgeon: Fairy Levels, [name];  Location: [facility];  Service: Neurosurgery;  Laterality: N/A;   BACK SURGERY     fluid removed from between  disks   BREAST SURGERY     bil breast reduction    CARPAL TUNNEL RELEASE     CHOLECYSTECTOMY     Laparoscopic cholecystectomy   COLONOSCOPY     HIP FRACTURE SURGERY     left  11/01/2013   LUMBAR PERCUTANEOUS PEDICLE SCREW 4 LEVEL N/A 04/25/2014   Procedure: **Stage 2** Percutaneous pedicle screw placement L1-5;  Surgeon: Fairy Levels, [name];  Location: [facility];  Service: Neurosurgery;  Laterality: N/A;  **Stage 2** Percutaneous pedicle screw placement L1-5   SHOULDER SURGERY     right shoulder 3x    TOE SURGERY     right big toe   TOTAL HIP ARTHROPLASTY Right 07/07/2018   Procedure: TOTAL HIP ARTHROPLASTY ANTERIOR APPROACH;  Surgeon: Ernie Cough, [name];  Location: [facility];  Service: Orthopedics;  Laterality: Right;  70 mins   TOTAL SHOULDER ARTHROPLASTY     UPPER GASTROINTESTINAL ENDOSCOPY  09/28/2016   Dilation    Allergies  Allergies  Allergen Reactions   Ampicillin Nausea And Vomiting   Bee Pollen     Other reaction(s): Other (See Comments)   Erythromycin Nausea And Vomiting and Other (See Comments)    Can take Z pac   Hctz [Hydrochlorothiazide ] Other (See Comments)    dizzy   Metoprolol  Other (See Comments)    doublevision    Molds & Smuts Other (See Comments)    Pt coughs and uses an inhaler.   Morphine And Codeine Nausea And Vomiting    No problem with hydrocodone    Penicillin G Other (See Comments)    Unknown Has patient had a PCN reaction causing immediate rash, facial/tongue/throat swelling, SOB or lightheadedness with hypotension: Unknown Has patient had a PCN reaction causing severe rash involving mucus membranes or skin necrosis: Unknown Has patient had a PCN reaction that required hospitalization: Unknown Has patient had a PCN reaction occurring within the last 10 years: Unknown If all of the above answers are NO, then may proceed with Cephalosporin use.     Home Medications    Prior to Admission medications   Medication Sig Start Date End Date  Taking? Authorizing Provider  albuterol  (VENTOLIN  HFA) 108 (90 Base) MCG/ACT inhaler Inhale 2 puffs into the lungs every 6 (six) hours as needed for wheezing or shortness of breath. 11/09/21   Mannam, Praveen, [name]  ALPRAZolam  (XANAX ) 0.25 MG tablet Take 0.25 mg by mouth 2 (two) times daily as needed for anxiety or sleep. 02/19/19   Provider, Historical, [name]  amLODipine -valsartan  (EXFORGE ) 5-160 MG tablet Take 1 tablet by mouth daily. 05/11/18   Plotnikov, Karlynn GAILS, [name]  aspirin  EC 81 MG tablet Take 1 tablet (81 mg total) by mouth daily. Swallow whole. 09/18/21   Tolia, Sunit, [name]  bacitracin  ointment Apply 1 Application topically 2 (two) times daily. 09/16/23   Silver Wonda LABOR, PA  Bempedoic Acid-Ezetimibe (NEXLIZET ) 180-10 MG TABS TAKE ONE TABLET DAILY AT 12 NOON 06/24/23   Tolia, Sunit, [name]  budesonide -formoterol  (SYMBICORT ) 160-4.5 MCG/ACT inhaler Inhale 2 puffs into the lungs 2 (two) times daily. in the morning and at bedtime. 06/26/23   Mannam, Praveen, [name]  buPROPion  (WELLBUTRIN  XL) 300 MG 24 hr tablet  Take 1 tablet (300 mg total) by mouth daily. 12/22/14   Plotnikov, Karlynn GAILS, [name]  cefadroxil  (DURICEF) 500 MG capsule Take 1 capsule (500 mg total) by mouth 2 (two) times daily. 09/16/23   Silver Wonda LABOR, PA  celecoxib  (CELEBREX ) 200 MG capsule Take 200 mg by mouth daily. 09/13/20   Provider, Historical, [name]  Cholecalciferol  (VITAMIN D  PO) Take 1 tablet by mouth daily.    Provider, Historical, [name]  CONCERTA  36 MG CR tablet Take 36 mg by mouth daily.  12/06/14   Provider, Historical, [name]  FLUoxetine  (PROZAC ) 20 MG capsule Take 3 capsules (60 mg total) by mouth daily. Patient taking differently: Take 20 mg by mouth daily. 04/23/17   Plotnikov, Aleksei V, [name]  HYDROcodone -acetaminophen  (NORCO/VICODIN) 5-325 MG tablet Take 1-2 tablets by mouth every 6 (six) hours as needed for severe pain (pain score 7-10). 06/17/23   Jerrol Agent, [name]  inFLIXimab  (REMICADE ) 100 MG injection IV infusion every 2 months 06/10/18    Provider, Historical, [name]  lamoTRIgine  (LAMICTAL ) 100 MG tablet Take 100 mg by mouth 3 (three) times daily.  05/31/15   Provider, Historical, [name]  methocarbamol  (ROBAXIN ) 500 MG tablet Take 1 tablet (500 mg total) by mouth every 6 (six) hours as needed for muscle spasms. 07/07/18   Danella Cough, PA-C  pantoprazole  (PROTONIX ) 40 MG tablet TAKE ONE TABLET TWICE DAILY Patient taking differently: Take 40 mg by mouth 2 (two) times daily. 01/13/18   Plotnikov, Aleksei V, [name]  rosuvastatin (CRESTOR) 10 MG tablet Take 10 mg by mouth daily.     Provider, Historical, [name]  Spacer/Aero-Holding Chambers (AEROCHAMBER MV) inhaler Use as instructed 11/09/21   Mannam, Praveen, [name]  SYNTHROID  50 MCG tablet TAKE 2 TABLETS EVERY MORNING Patient taking differently: Take 100 mcg by mouth daily before breakfast. 10/01/18   Plotnikov, Karlynn GAILS, [name]  losartan -hydrochlorothiazide  (HYZAAR) 50-12.5 MG per tablet Take 1 tablet by mouth daily.  09/04/11  Provider, Historical, [name]  simvastatin  (ZOCOR ) 20 MG tablet Take 20 mg by mouth every evening.  09/04/11  Provider, Historical, [name]    Physical Exam    Vital Signs:  [name] does not have vital signs available for review today.  Given telephonic nature of communication, physical exam is limited. AAOx3. NAD. Normal affect.  Speech and respirations are unlabored.   Assessment & Plan    Primary Cardiologist: Tammy Large, [name]  Preoperative cardiovascular risk assessment.  Right total knee arthroplasty by Dr. Ernie.  Chart reviewed as part of pre-operative protocol coverage. According to the [facility], patient has a 0.5% risk of MACE. Patient reports activity equivalent to 4.0 METS (walks with walker around her home, performs seated exercises every other day, participates in PT twice a week).   Given past medical history and time since last visit, based on ACC/AHA guidelines, [name] would be at acceptable risk for the planned procedure without further  cardiovascular testing.   Patient was advised that if she develops new symptoms prior to surgery to contact our office to arrange a follow-up appointment.  she verbalized understanding.  Patient reports she is not taking aspirin .   I will route this recommendation to the requesting party via Epic fax function.  Please call with questions.  Time:   Today, I have spent 6 minutes with the patient with telehealth technology discussing medical history, symptoms, and management plan.     Barnie Hila, NP  01/19/2024, 7:37 AM

## 2024-01-20 DIAGNOSIS — M25569 Pain in unspecified knee: Secondary | ICD-10-CM | POA: Diagnosis not present

## 2024-01-22 DIAGNOSIS — M25569 Pain in unspecified knee: Secondary | ICD-10-CM | POA: Diagnosis not present

## 2024-01-26 DIAGNOSIS — M25569 Pain in unspecified knee: Secondary | ICD-10-CM | POA: Diagnosis not present

## 2024-02-03 DIAGNOSIS — M25569 Pain in unspecified knee: Secondary | ICD-10-CM | POA: Diagnosis not present

## 2024-02-04 DIAGNOSIS — M25512 Pain in left shoulder: Secondary | ICD-10-CM | POA: Diagnosis not present

## 2024-02-04 DIAGNOSIS — M542 Cervicalgia: Secondary | ICD-10-CM | POA: Diagnosis not present

## 2024-02-04 DIAGNOSIS — M25661 Stiffness of right knee, not elsewhere classified: Secondary | ICD-10-CM | POA: Diagnosis not present

## 2024-02-04 DIAGNOSIS — M25561 Pain in right knee: Secondary | ICD-10-CM | POA: Diagnosis not present

## 2024-02-05 DIAGNOSIS — M25569 Pain in unspecified knee: Secondary | ICD-10-CM | POA: Diagnosis not present

## 2024-02-10 DIAGNOSIS — E7849 Other hyperlipidemia: Secondary | ICD-10-CM | POA: Diagnosis not present

## 2024-02-10 DIAGNOSIS — I129 Hypertensive chronic kidney disease with stage 1 through stage 4 chronic kidney disease, or unspecified chronic kidney disease: Secondary | ICD-10-CM | POA: Diagnosis not present

## 2024-02-10 DIAGNOSIS — M25569 Pain in unspecified knee: Secondary | ICD-10-CM | POA: Diagnosis not present

## 2024-02-10 DIAGNOSIS — N1831 Chronic kidney disease, stage 3a: Secondary | ICD-10-CM | POA: Diagnosis not present

## 2024-02-10 DIAGNOSIS — M810 Age-related osteoporosis without current pathological fracture: Secondary | ICD-10-CM | POA: Diagnosis not present

## 2024-02-10 DIAGNOSIS — R7301 Impaired fasting glucose: Secondary | ICD-10-CM | POA: Diagnosis not present

## 2024-02-10 DIAGNOSIS — E785 Hyperlipidemia, unspecified: Secondary | ICD-10-CM | POA: Diagnosis not present

## 2024-02-10 DIAGNOSIS — E039 Hypothyroidism, unspecified: Secondary | ICD-10-CM | POA: Diagnosis not present

## 2024-02-12 DIAGNOSIS — M810 Age-related osteoporosis without current pathological fracture: Secondary | ICD-10-CM | POA: Diagnosis not present

## 2024-02-12 DIAGNOSIS — E785 Hyperlipidemia, unspecified: Secondary | ICD-10-CM | POA: Diagnosis not present

## 2024-02-12 DIAGNOSIS — N1831 Chronic kidney disease, stage 3a: Secondary | ICD-10-CM | POA: Diagnosis not present

## 2024-02-12 DIAGNOSIS — E039 Hypothyroidism, unspecified: Secondary | ICD-10-CM | POA: Diagnosis not present

## 2024-02-12 DIAGNOSIS — Z1331 Encounter for screening for depression: Secondary | ICD-10-CM | POA: Diagnosis not present

## 2024-02-12 DIAGNOSIS — M170 Bilateral primary osteoarthritis of knee: Secondary | ICD-10-CM | POA: Diagnosis not present

## 2024-02-12 DIAGNOSIS — D849 Immunodeficiency, unspecified: Secondary | ICD-10-CM | POA: Diagnosis not present

## 2024-02-12 DIAGNOSIS — J45909 Unspecified asthma, uncomplicated: Secondary | ICD-10-CM | POA: Diagnosis not present

## 2024-02-12 DIAGNOSIS — F3342 Major depressive disorder, recurrent, in full remission: Secondary | ICD-10-CM | POA: Diagnosis not present

## 2024-02-12 DIAGNOSIS — Z23 Encounter for immunization: Secondary | ICD-10-CM | POA: Diagnosis not present

## 2024-02-12 DIAGNOSIS — Z1339 Encounter for screening examination for other mental health and behavioral disorders: Secondary | ICD-10-CM | POA: Diagnosis not present

## 2024-02-12 DIAGNOSIS — K5190 Ulcerative colitis, unspecified, without complications: Secondary | ICD-10-CM | POA: Diagnosis not present

## 2024-02-12 DIAGNOSIS — I129 Hypertensive chronic kidney disease with stage 1 through stage 4 chronic kidney disease, or unspecified chronic kidney disease: Secondary | ICD-10-CM | POA: Diagnosis not present

## 2024-02-12 DIAGNOSIS — I10 Essential (primary) hypertension: Secondary | ICD-10-CM | POA: Diagnosis not present

## 2024-02-12 DIAGNOSIS — I6523 Occlusion and stenosis of bilateral carotid arteries: Secondary | ICD-10-CM | POA: Diagnosis not present

## 2024-02-12 DIAGNOSIS — F411 Generalized anxiety disorder: Secondary | ICD-10-CM | POA: Diagnosis not present

## 2024-02-12 DIAGNOSIS — R82998 Other abnormal findings in urine: Secondary | ICD-10-CM | POA: Diagnosis not present

## 2024-02-12 DIAGNOSIS — Z0000 Encounter for general adult medical examination without abnormal findings: Secondary | ICD-10-CM | POA: Diagnosis not present

## 2024-02-13 ENCOUNTER — Telehealth (HOSPITAL_COMMUNITY): Payer: Self-pay

## 2024-02-13 DIAGNOSIS — M542 Cervicalgia: Secondary | ICD-10-CM | POA: Diagnosis not present

## 2024-02-13 NOTE — Telephone Encounter (Signed)
 Auth Submission: NO AUTH NEEDED Site of care: Site of care: [facility] Payer: Medicare A/B, [employer] Supplement Medication & CPT/J Code(s) submitted: Prolia  (Denosumab ) [id number] Diagnosis Code: M81.0 Route of submission (phone, fax, portal):  Phone # Fax # Auth type: Buy/Bill HB Units/visits requested: 60mg  q 6 months x  2 doses Reference number:  Approval from: 02/13/24 to 07/24/24

## 2024-02-19 DIAGNOSIS — M25569 Pain in unspecified knee: Secondary | ICD-10-CM | POA: Diagnosis not present

## 2024-02-25 DIAGNOSIS — M75102 Unspecified rotator cuff tear or rupture of left shoulder, not specified as traumatic: Secondary | ICD-10-CM | POA: Diagnosis not present

## 2024-03-02 ENCOUNTER — Other Ambulatory Visit (HOSPITAL_COMMUNITY): Payer: Self-pay | Admitting: Internal Medicine

## 2024-03-02 NOTE — Patient Instructions (Signed)
 SURGICAL WAITING ROOM VISITATION Patients having surgery or a procedure may have no more than 2 support people in the waiting area - these visitors may rotate in the visitor waiting room.   If the patient needs to stay at the hospital during part of their recovery, the visitor guidelines for inpatient rooms apply.  PRE-OP VISITATION  Pre-op nurse will coordinate an appropriate time for 1 support person to accompany the patient in pre-op.  This support person may not rotate.  This visitor will be contacted when the time is appropriate for the visitor to come back in the pre-op area.  Please refer to the [facility] website for the visitor guidelines for Inpatients (after your surgery is over and you are in a regular room).  You are not required to quarantine at this time prior to your surgery. However, you must do this: Hand Hygiene often Do NOT share personal items Notify your provider if you are in close contact with someone who has COVID or you develop fever 100.4 or greater, new onset of sneezing, cough, sore throat, shortness of breath or body aches.  If you test positive for Covid or have been in contact with anyone that has tested positive in the last 10 days please notify you surgeon.    Your procedure is scheduled on:  TUESDAY  March 09, 2024  Report to [facility] Main Entrance: [address] entrance where the [employer] is available.   Report to admitting at:  08:45 AM  Call this number if you have any questions or problems the morning of surgery [phone]  Do not eat food after Midnight the night prior to your surgery/procedure.  After Midnight you may have the following liquids until  08:15  AM DAY OF SURGERY  Clear Liquid Diet Water Black Coffee (sugar ok, NO MILK/CREAM OR CREAMERS)  Tea (sugar ok, NO MILK/CREAM OR CREAMERS) regular and decaf                             Plain Jell-O  with no fruit (NO RED)                                           Fruit  ices (not with fruit pulp, NO RED)                                     Popsicles (NO RED)                                                                  Juice: NO CITRUS JUICES: only apple, WHITE grape, WHITE cranberry Sports drinks like Gatorade or Powerade (NO RED)                The day of surgery:  Drink ONE (1) Pre-Surgery Clear Ensure at 08:15 AM the morning of surgery. Drink in one sitting. Do not sip.  This drink was given to you during your hospital pre-op appointment visit. Nothing else to drink after completing the Pre-Surgery Clear Ensure or  G2 : No candy, chewing gum or throat lozenges.    FOLLOW ANY ADDITIONAL PRE OP INSTRUCTIONS YOU RECEIVED FROM YOUR SURGEON'S OFFICE!!!   Oral Hygiene is also important to reduce your risk of infection.        Remember - BRUSH YOUR TEETH THE MORNING OF SURGERY WITH YOUR REGULAR TOOTHPASTE  Do NOT smoke after Midnight the night before surgery.  STOP TAKING all Vitamins, Herbs and supplements 1 week before your surgery.   REMICADE - next injection has been postponed until after her surgery  Take ONLY these medicines the morning of surgery with A SIP OF WATER: Pantoprazole , Synthroid , Loratadine, fluoxetine , Bupropion  and Alprazolam  if needed. You may use your inhalers if need; please bring your Albuterol  inhaler with you on the day of surgery.   DO NOT TAKE Concerta  or amlodipine -valsartan , the morning of your surgery.                    You may not have any metal on your body including hair pins, jewelry, and body piercing  Do not wear make-up, lotions, powders, perfumes or deodorant  Do not wear nail polish including gel and S&S, artificial / acrylic nails, or any other type of covering on natural nails including finger and toenails. If you have artificial nails, gel coating, etc., that needs to be removed by a nail salon, Please have this removed prior to surgery. Not doing so may mean that your surgery could be cancelled or delayed  if the Surgeon or anesthesia staff feels like they are unable to monitor you safely.   Do not shave 48 hours prior to surgery to avoid nicks in your skin which may contribute to postoperative infections.   Contacts, Hearing Aids, dentures or bridgework may not be worn into surgery. DENTURES WILL BE REMOVED PRIOR TO SURGERY PLEASE DO NOT APPLY Poly grip OR ADHESIVES!!!  You may bring a small overnight bag with you on the day of surgery, only pack items that are not valuable. [facility] IS NOT RESPONSIBLE   FOR VALUABLES THAT ARE LOST OR STOLEN.   Do not bring your home medications to the hospital. The Pharmacy will dispense medications listed on your medication list to you during your admission in the Hospital.  Special Instructions: Bring a copy of your healthcare power of attorney and living will documents the day of surgery, if you wish to have them scanned into your [facility] Records- EPIC  Please read over the following fact sheets you were given: IF YOU HAVE QUESTIONS ABOUT YOUR PRE-OP INSTRUCTIONS, PLEASE CALL [phone].     Pre-operative 5 CHG Bath Instructions   You can play a key role in reducing the risk of infection after surgery. Your skin needs to be as free of germs as possible. You can reduce the number of germs on your skin by washing with CHG (chlorhexidine  gluconate) soap before surgery. CHG is an antiseptic soap that kills germs and continues to kill germs even after washing.   DO NOT use if you have an allergy to chlorhexidine /CHG or antibacterial soaps. If your skin becomes reddened or irritated, stop using the CHG and notify one of our RNs at [phone]  Please shower with the CHG soap starting 4 days before surgery using the following schedule: START SHOWERS ON   FRIDAY March 05, 2024  Please keep in mind the following:  DO NOT shave, including legs and underarms, starting the day of your first shower.   You may shave your face at any point before/day of surgery.   Place clean sheets on your bed the day you start using CHG soap. Use a clean washcloth (not used since being washed) for each shower. DO NOT sleep with pets once you start using the CHG.   CHG Shower Instructions:  If you choose to wash your hair and private area, wash first with your normal shampoo/soap.  After you use shampoo/soap, rinse your hair and body thoroughly to remove shampoo/soap residue.  Turn the water OFF and apply about 3 tablespoons (45 ml) of CHG soap to a CLEAN washcloth.  Apply CHG soap ONLY FROM YOUR NECK DOWN TO YOUR TOES (washing for 3-5 minutes)  DO NOT use CHG soap on face, private areas, open wounds, or sores.  Pay special attention to the area where your surgery is being performed.  If you are having back surgery, having someone wash your back for you may be helpful.  Wait 2 minutes after CHG soap is applied, then you may rinse off the CHG soap.  Pat dry with a clean towel  Put on clean clothes/pajamas   If you choose to wear lotion, please use ONLY the CHG-compatible lotions on the back of this paper.     Additional instructions for the day of surgery: DO NOT APPLY any lotions, deodorants, cologne, or perfumes.   Put on clean/comfortable clothes.  Brush your teeth.  Ask your nurse before applying any prescription medications to the skin.      CHG Compatible Lotions   Aveeno Moisturizing lotion  Cetaphil Moisturizing Cream  Cetaphil Moisturizing Lotion  Clairol Herbal Essence Moisturizing Lotion, Dry Skin  Clairol Herbal Essence Moisturizing Lotion, Extra Dry Skin  Clairol Herbal Essence Moisturizing Lotion, Normal Skin  Curel Age Defying Therapeutic Moisturizing Lotion with Alpha Hydroxy  Curel Extreme Care Body Lotion  Curel Soothing Hands Moisturizing Hand  Lotion  Curel Therapeutic Moisturizing Cream, Fragrance-Free  Curel Therapeutic Moisturizing Lotion, Fragrance-Free  Curel Therapeutic Moisturizing Lotion, Original Formula  Eucerin Daily Replenishing Lotion  Eucerin Dry Skin Therapy Plus Alpha Hydroxy Crme  Eucerin Dry Skin Therapy Plus Alpha Hydroxy Lotion  Eucerin Original Crme  Eucerin Original Lotion  Eucerin Plus Crme Eucerin Plus Lotion  Eucerin TriLipid Replenishing Lotion  Keri Anti-Bacterial Hand Lotion  Keri Deep Conditioning Original Lotion Dry Skin Formula Softly Scented  Keri Deep Conditioning Original Lotion, Fragrance Free Sensitive Skin Formula  Keri Lotion Fast Absorbing Fragrance Free Sensitive Skin Formula  Keri Lotion Fast Absorbing Softly Scented Dry Skin Formula  Keri Original Lotion  Keri Skin Renewal Lotion Keri Silky Smooth Lotion  Keri Silky Smooth Sensitive Skin Lotion  Nivea Body Creamy Conditioning Oil  Nivea Body Extra Enriched Lotion  Nivea Body Original Lotion  Nivea Body Sheer Moisturizing Lotion Nivea Crme  Nivea Skin Firming Lotion  NutraDerm 30 Skin Lotion  NutraDerm Skin Lotion  NutraDerm Therapeutic Skin Cream  NutraDerm Therapeutic Skin Lotion  ProShield Protective Hand Cream  Provon moisturizing lotion   FAILURE TO FOLLOW THESE INSTRUCTIONS MAY RESULT IN THE CANCELLATION OF YOUR SURGERY  PATIENT SIGNATURE_________________________________  NURSE SIGNATURE__________________________________  ________________________________________________________________________       [name]    An incentive spirometer is a tool that can help keep your lungs clear and active. This tool measures how well you are filling your lungs with each breath. Taking long  deep breaths may help reverse or decrease the chance of developing breathing (pulmonary) problems (especially infection) following: A long period of time when you are unable to move or be active. BEFORE THE PROCEDURE  If  the spirometer includes an indicator to show your best effort, your nurse or respiratory therapist will set it to a desired goal. If possible, sit up straight or lean slightly forward. Try not to slouch. Hold the incentive spirometer in an upright position. INSTRUCTIONS FOR USE  Sit on the edge of your bed if possible, or sit up as far as you can in bed or on a chair. Hold the incentive spirometer in an upright position. Breathe out normally. Place the mouthpiece in your mouth and seal your lips tightly around it. Breathe in slowly and as deeply as possible, raising the piston or the ball toward the top of the column. Hold your breath for 3-5 seconds or for as long as possible. Allow the piston or ball to fall to the bottom of the column. Remove the mouthpiece from your mouth and breathe out normally. Rest for a few seconds and repeat Steps 1 through 7 at least 10 times every 1-2 hours when you are awake. Take your time and take a few normal breaths between deep breaths. The spirometer may include an indicator to show your best effort. Use the indicator as a goal to work toward during each repetition. After each set of 10 deep breaths, practice coughing to be sure your lungs are clear. If you have an incision (the cut made at the time of surgery), support your incision when coughing by placing a pillow or rolled up towels firmly against it. Once you are able to get out of bed, walk around indoors and cough well. You may stop using the incentive spirometer when instructed by your caregiver.  RISKS AND COMPLICATIONS Take your time so you do not get dizzy or light-headed. If you are in pain, you may need to take or ask for pain medication before doing incentive spirometry. It is harder to take a deep breath if you are having pain. AFTER USE Rest and breathe slowly and easily. It can be helpful to keep track of a log of your progress. Your caregiver can provide you with a simple table to help with  this. If you are using the spirometer at home, follow these instructions: SEEK MEDICAL CARE IF:  You are having difficultly using the spirometer. You have trouble using the spirometer as often as instructed. Your pain medication is not giving enough relief while using the spirometer. You develop fever of 100.5 F (38.1 C) or higher.                                                                                                    SEEK IMMEDIATE MEDICAL CARE IF:  You cough up bloody sputum that had not been present before. You develop fever of 102 F (38.9 C) or greater. You develop worsening pain at or near the incision site. MAKE SURE YOU:  Understand these instructions. Will watch your condition. Will  get help right away if you are not doing well or get worse. Document Released: 10/21/2006 Document Revised: 09/02/2011 Document Reviewed: 12/22/2006 [facility] Patient Information 2014 [place], [place].       If you would like to see a video about joint replacement:   [email or web address]

## 2024-03-02 NOTE — Progress Notes (Signed)
 COVID Vaccine received:  []  No []  Yes Date of any COVID positive Test in last 90 days:  PCP - Elsie Gentry, MD clearance scanned to Media.  Cardiologist - Madonna Large, DO  Barnie Hila, NP  cardiac clearance in 01-19-24 note Rheumatology- Cindy Setter, MD  Chest x-ray - 06-12-2023  2v  Epic EKG - 06-20-2023  Epic  Stress Test - Lexiscan   10-24-21  Epic ECHO - 09-05-2023  Epic Cardiac Cath -  CT Coronary Calcium  score:   Pacemaker / ICD device [x]  No []  Yes   Spinal Cord Stimulator:[x]  No []  Yes       History of Sleep Apnea? [x]  No []  Yes   CPAP used?- [x]  No []  Yes    Patient has: [x]  NO Hx DM   []  Pre-DM   []  DM1  []   DM2 Does the patient monitor blood sugar?   [x]  N/A   []  No []  Yes  Last A1c was:  5.2 normal on  01-24-24      Blood Thinner / Instructions:  none Aspirin  Instructions:  none  ERAS Protocol Ordered: []  No  [x]  Yes PRE-SURGERY [x]  ENSURE  []  G2   Patient is to be NPO after: 0815  Dental hx: []  Dentures:  []  N/A      []  Bridge or Partial:                   [x]  Loose or Damaged teeth:   Comments: Patient was given the 5 CHG shower / bath instructions for  TKA surgery along with 2 bottles of the CHG soap. Patient will start this on:   Friday 03-05-24           Activity level: Able to walk up 2 flights of stairs without becoming significantly short of breath or having chest pain?  [x]  No   []    Yes  Patient can perform ADLs without assistance. []  No   [x]   Yes  Anesthesia review:  ADHD, HTN, Mild- Mod. AS, hard to wake up, very groggy, some memory loss- mild cognitive impairment.   Patient was lucid and appropriate with her answers to all my PST questions. Her Care Partner/ Aide, Sharlet Clause, was present and did not correct any of her answers. She has been taking all her medications as directed with no lapses.   Patient denies any S&S of respiratory illness or Covid - no shortness of breath, fever, cough or chest pain at PAT appointment.  Patient verbalized  understanding and agreement to the Pre-Surgical Instructions that were given to them at this PAT appointment. Patient was also educated of the need to review these PAT instructions again prior to her surgery.I reviewed the appropriate phone numbers to call if they have any and questions or concerns.

## 2024-03-03 ENCOUNTER — Encounter (HOSPITAL_COMMUNITY): Payer: Self-pay

## 2024-03-03 ENCOUNTER — Encounter (HOSPITAL_COMMUNITY)
Admission: RE | Admit: 2024-03-03 | Discharge: 2024-03-03 | Disposition: A | Discharge status: Home / Self Care | Source: Ambulatory Visit | Attending: Orthopedic Surgery | Admitting: Orthopedic Surgery

## 2024-03-03 ENCOUNTER — Other Ambulatory Visit: Payer: Self-pay

## 2024-03-03 VITALS — HR 66 | Temp 98.1°F | Resp 16 | Ht 65.0 in | Wt 160.0 lb

## 2024-03-03 DIAGNOSIS — M1712 Unilateral primary osteoarthritis, left knee: Secondary | ICD-10-CM | POA: Insufficient documentation

## 2024-03-03 DIAGNOSIS — Z7951 Long term (current) use of inhaled steroids: Secondary | ICD-10-CM | POA: Diagnosis not present

## 2024-03-03 DIAGNOSIS — I10 Essential (primary) hypertension: Secondary | ICD-10-CM | POA: Diagnosis not present

## 2024-03-03 DIAGNOSIS — E039 Hypothyroidism, unspecified: Secondary | ICD-10-CM | POA: Diagnosis not present

## 2024-03-03 DIAGNOSIS — F909 Attention-deficit hyperactivity disorder, unspecified type: Secondary | ICD-10-CM | POA: Insufficient documentation

## 2024-03-03 DIAGNOSIS — Z01818 Encounter for other preprocedural examination: Secondary | ICD-10-CM | POA: Insufficient documentation

## 2024-03-03 DIAGNOSIS — Z79899 Other long term (current) drug therapy: Secondary | ICD-10-CM | POA: Insufficient documentation

## 2024-03-03 HISTORY — DX: Hypothyroidism, unspecified: E03.9

## 2024-03-03 HISTORY — DX: Cardiac murmur, unspecified: R01.1

## 2024-03-03 LAB — CBC
HCT: 37.8 % (ref 36.0–46.0)
Hemoglobin: 12.5 g/dL (ref 12.0–15.0)
MCH: 32.9 pg (ref 26.0–34.0)
MCHC: 33.1 g/dL (ref 30.0–36.0)
MCV: 99.5 fL (ref 80.0–100.0)
Platelets: 219 K/uL (ref 150–400)
RBC: 3.8 MIL/uL — ABNORMAL LOW (ref 3.87–5.11)
RDW: 13.2 % (ref 11.5–15.5)
WBC: 6.3 K/uL (ref 4.0–10.5)
nRBC: 0 % (ref 0.0–0.2)

## 2024-03-03 LAB — COMPREHENSIVE METABOLIC PANEL WITH GFR
ALT: 18 U/L (ref 0–44)
AST: 27 U/L (ref 15–41)
Albumin: 4.5 g/dL (ref 3.5–5.0)
Alkaline Phosphatase: 46 U/L (ref 38–126)
Anion gap: 11 (ref 5–15)
BUN: 21 mg/dL (ref 8–23)
CO2: 25 mmol/L (ref 22–32)
Calcium: 10.2 mg/dL (ref 8.9–10.3)
Chloride: 105 mmol/L (ref 98–111)
Creatinine, Ser: 1.04 mg/dL — ABNORMAL HIGH (ref 0.44–1.00)
GFR, Estimated: 55 mL/min — ABNORMAL LOW (ref >60)
Glucose, Bld: 76 mg/dL (ref 70–99)
Potassium: 5.1 mmol/L (ref 3.5–5.1)
Sodium: 141 mmol/L (ref 135–145)
Total Bilirubin: 0.4 mg/dL (ref 0.0–1.2)
Total Protein: 7.3 g/dL (ref 6.5–8.1)

## 2024-03-03 LAB — SURGICAL PCR SCREEN
MRSA, PCR: NEGATIVE
Staphylococcus aureus: NEGATIVE

## 2024-03-05 NOTE — Anesthesia Preprocedure Evaluation (Addendum)
 Anesthesia Evaluation  Patient identified by MRN, date of birth, ID band Patient awake    Reviewed: Allergy & Precautions, NPO status , Patient's Chart, lab work & pertinent test results  History of Anesthesia Complications (+) PROLONGED EMERGENCE and history of anesthetic complications  Airway Mallampati: III  TM Distance: >3 FB Neck ROM: Full    Dental  (+) Teeth Intact, Dental Advisory Given   Pulmonary asthma , Patient abstained from smoking., former smoker Symbicort - last used yesterday  Last used albuterol  about a month ago   Pulmonary exam normal breath sounds clear to auscultation       Cardiovascular hypertension (156/84 preop), Pt. on medications Normal cardiovascular exam+ Valvular Problems/Murmurs (mild-mod AS) AS  Rhythm:Regular Rate:Normal  Echo 05/2023 Echo 05/28/2023  1. Left ventricular ejection fraction, by estimation, is 60 to 65%. The  left ventricle has normal function. The left ventricle has no regional  wall motion abnormalities. There is mild left ventricular hypertrophy.  Left ventricular diastolic parameters  are consistent with Grade I diastolic dysfunction (impaired relaxation).   2. Right ventricular systolic function is normal. The right ventricular  size is normal. There is normal pulmonary artery systolic pressure.   3. Left atrial size was mild to moderately dilated.   4. Trivial mitral valve regurgitation. Moderate mitral annular  calcification.   5. The aortic valve is calcified. Aortic valve regurgitation is not  visualized. Mild to moderate aortic valve stenosis.   6. The inferior vena cava is normal in size with greater than 50%  respiratory variability, suggesting right atrial pressure of 3 mmHg.     Neuro/Psych  PSYCHIATRIC DISORDERS Anxiety Depression    negative neurological ROS     GI/Hepatic Neg liver ROS, hiatal hernia, PUD,GERD  Controlled and Medicated,,  Endo/Other   Hypothyroidism    Renal/GU negative Renal ROS  negative genitourinary   Musculoskeletal  (+) Arthritis , Osteoarthritis,  2015 ant/post L1-5   Abdominal   Peds  Hematology negative hematology ROS (+)   Anesthesia Other Findings   Reproductive/Obstetrics negative OB ROS                              Anesthesia Physical Anesthesia Plan  ASA: 3  Anesthesia Plan: General   Post-op Pain Management: Tylenol  PO (pre-op)*   Induction: Intravenous  PONV Risk Score and Plan: 3 and Ondansetron , Dexamethasone , Treatment may vary due to age or medical condition, Propofol  infusion and TIVA  Airway Management Planned: Oral ETT  Additional Equipment: None  Intra-op Plan:   Post-operative Plan: Extubation in OR  Informed Consent: I have reviewed the patients History and Physical, chart, labs and discussed the procedure including the risks, benefits and alternatives for the proposed anesthesia with the patient or authorized representative who has indicated his/her understanding and acceptance.     Dental advisory given  Plan Discussed with: CRNA  Anesthesia Plan Comments: (2020 THR under GA, fused L1-5)         Anesthesia Quick Evaluation

## 2024-03-05 NOTE — Progress Notes (Signed)
 Anesthesia Chart Review   Case: [id number] Date/Time: 03/09/24 1100   Procedure: ARTHROPLASTY, KNEE, TOTAL (Left: Knee)   Anesthesia type: Spinal   Diagnosis: Primary osteoarthritis of left knee [M17.12]   Pre-op diagnosis: left knee osteoarthritis   Location: [facility] ROOM 10 / [facility]   Surgeons: Ernie Cough, MD       DISCUSSION:78 y.o. former smoker with h/o GERD, HTN, hypothyroidism, mild to moderate AS, left knee OA scheduled for above procedure 03/09/2024 with Dr. Cough Ernie.   Per cardiology preoperative evaluation 01/19/2024, Chart reviewed as part of pre-operative protocol coverage. According to the RCRI, patient has a 0.5% risk of MACE. Patient reports activity equivalent to 4.0 METS (walks with walker around her home, performs seated exercises every other day, participates in PT twice a week).    Given past medical history and time since last visit, based on ACC/AHA guidelines, [name] would be at acceptable risk for the planned procedure without further cardiovascular testing.  VS: Pulse 66   Temp 36.7 C (Oral)   Resp 16   Ht 5' 5 (1.651 m)   Wt 72.6 kg   BMI 26.63 kg/m   PROVIDERS: [name]., MD is PCP    LABS: Labs reviewed: Acceptable for surgery. (all labs ordered are listed, but only abnormal results are displayed)  Labs Reviewed  COMPREHENSIVE METABOLIC PANEL WITH GFR - Abnormal; Notable for the following components:      Result Value   Creatinine, Ser 1.04 (*)    GFR, Estimated 55 (*)    All other components within normal limits  CBC - Abnormal; Notable for the following components:   RBC 3.80 (*)    All other components within normal limits  SURGICAL PCR SCREEN     IMAGES: Carotid artery duplex 11/21/2022:  Duplex suggests stenosis in the right internal carotid artery (1-15%).  <50% stenosis in the left carotid vessels.  Antegrade right vertebral artery flow. Antegrade left vertebral artery  flow.  Compared to the  study done on 09/07/2021, there is regression of bilateral  ICA stenosis from 16 to 49%.  There is mild heterogenous plaque noted in  bilateral carotid arteries.  Follow-up studies if clinically indicated.   EKG:   CV: Echo 05/28/2023  1. Left ventricular ejection fraction, by estimation, is 60 to 65%. The  left ventricle has normal function. The left ventricle has no regional  wall motion abnormalities. There is mild left ventricular hypertrophy.  Left ventricular diastolic parameters  are consistent with Grade I diastolic dysfunction (impaired relaxation).   2. Right ventricular systolic function is normal. The right ventricular  size is normal. There is normal pulmonary artery systolic pressure.   3. Left atrial size was mild to moderately dilated.   4. Trivial mitral valve regurgitation. Moderate mitral annular  calcification.   5. The aortic valve is calcified. Aortic valve regurgitation is not  visualized. Mild to moderate aortic valve stenosis.   6. The inferior vena cava is normal in size with greater than 50%  respiratory variability, suggesting right atrial pressure of 3 mmHg.  Past Medical History:  Diagnosis Date   ADD (attention deficit disorder)    Allergy    Anxiety    Asthma    Attention deficit disorder without mention of hyperactivity    Back pain    Colitis 12/05/2010   Colon polyps    Complication of anesthesia    problem with grogginess and a long time to get over Anesthesia  Depression    Diverticulosis    GERD (gastroesophageal reflux disease)    H/O hiatal hernia    Heart murmur    mild to moderate AS per ECHO 05-2023   Hypertension    Hypothyroidism    IBS (irritable bowel syndrome)    MCI (mild cognitive impairment)    Memory changes    Osteoarthrosis, unspecified whether generalized or localized, unspecified site    RA   Other and unspecified hyperlipidemia    Renal cyst 10/2015   simple left   Repeated falls    Thyroid  disease     Ulcerative colitis    Unspecified asthma(493.90)    Unspecified essential hypertension    taken off meds since lost wt    Past Surgical History:  Procedure Laterality Date   ABDOMINAL EXPOSURE N/A 04/22/2014   Procedure: ABDOMINAL EXPOSURE;  Surgeon: Lonni GORMAN Blade, MD;  Location: [facility];  Service: Vascular;  Laterality: N/A;   ANTERIOR LATERAL LUMBAR FUSION 4 LEVELS Left 04/22/2014   Procedure: Left Lumbar one/two, two/three, three/four, four,five. Anterior lateral lumbar interbody fusion**Stage 1**;  Surgeon: Fairy Levels, MD;  Location: [facility];  Service: Neurosurgery;  Laterality: Left;   ANTERIOR LUMBAR FUSION N/A 04/22/2014   Procedure: Lumbar five/-Sacral one. Anterior lumbar interbody fusion with Dr. Blade; Left Lumbar one/two, two/three/three/four, four/five. Anterior lateral lumbar interbody fusion**Stage 1**;  Surgeon: Fairy Levels, MD;  Location: [facility];  Service: Neurosurgery;  Laterality: N/A;   BACK SURGERY     fluid removed from between disks   BREAST SURGERY     bil breast reduction    CARPAL TUNNEL RELEASE Bilateral    CHOLECYSTECTOMY     Laparoscopic cholecystectomy   COLONOSCOPY     HIP FRACTURE SURGERY     left  11/01/2013   LUMBAR PERCUTANEOUS PEDICLE SCREW 4 LEVEL N/A 04/25/2014   Procedure: **Stage 2** Percutaneous pedicle screw placement L1-5;  Surgeon: Fairy Levels, MD;  Location: [facility];  Service: Neurosurgery;  Laterality: N/A;  **Stage 2** Percutaneous pedicle screw placement L1-5   SHOULDER SURGERY     right shoulder 3x    TOE SURGERY     right big toe   TOTAL HIP ARTHROPLASTY Right 07/07/2018   Procedure: TOTAL HIP ARTHROPLASTY ANTERIOR APPROACH;  Surgeon: Ernie Cough, MD;  Location: [facility];  Service: Orthopedics;  Laterality: Right;  70 mins   TOTAL SHOULDER ARTHROPLASTY Right    Reverse total   UPPER GASTROINTESTINAL ENDOSCOPY  09/28/2016   Dilation    MEDICATIONS:  albuterol  (VENTOLIN  HFA) 108 (90 Base) MCG/ACT  inhaler   ALPRAZolam  (XANAX ) 0.25 MG tablet   amLODipine -valsartan  (EXFORGE ) 5-160 MG tablet   aspirin  EC 81 MG tablet   bacitracin  ointment   Bempedoic Acid-Ezetimibe (NEXLIZET ) 180-10 MG TABS   budesonide -formoterol  (SYMBICORT ) 160-4.5 MCG/ACT inhaler   buPROPion  (WELLBUTRIN  XL) 300 MG 24 hr tablet   cefadroxil  (DURICEF) 500 MG capsule   celecoxib  (CELEBREX ) 200 MG capsule   Cholecalciferol  (VITAMIN D  PO)   CONCERTA  36 MG CR tablet   FLUoxetine  (PROZAC ) 10 MG capsule   FLUoxetine  (PROZAC ) 20 MG capsule   FLUoxetine  (PROZAC ) 40 MG capsule   HYDROcodone -acetaminophen  (NORCO/VICODIN) 5-325 MG tablet   inFLIXimab  (REMICADE ) 100 MG injection   lamoTRIgine  (LAMICTAL ) 100 MG tablet   loratadine (CLARITIN) 10 MG tablet   methocarbamol  (ROBAXIN ) 500 MG tablet   pantoprazole  (PROTONIX ) 40 MG tablet   rosuvastatin (CRESTOR) 10 MG tablet   Spacer/Aero-Holding Chambers (AEROCHAMBER MV) inhaler  SYNTHROID  50 MCG tablet   No current facility-administered medications for this encounter.    Harlene Hoots Ward, PA-C [facility] [phone]

## 2024-03-07 DIAGNOSIS — M5416 Radiculopathy, lumbar region: Secondary | ICD-10-CM | POA: Diagnosis not present

## 2024-03-07 DIAGNOSIS — M069 Rheumatoid arthritis, unspecified: Secondary | ICD-10-CM | POA: Diagnosis not present

## 2024-03-09 ENCOUNTER — Ambulatory Visit (HOSPITAL_COMMUNITY): Payer: Self-pay | Admitting: Anesthesiology

## 2024-03-09 ENCOUNTER — Inpatient Hospital Stay (HOSPITAL_COMMUNITY)
Admission: RE | Admit: 2024-03-09 | Discharge: 2024-03-12 | DRG: 470 | Disposition: A | Discharge status: Home Health | Attending: Orthopedic Surgery | Admitting: Orthopedic Surgery

## 2024-03-09 ENCOUNTER — Ambulatory Visit (HOSPITAL_COMMUNITY): Payer: Self-pay | Admitting: Physician Assistant

## 2024-03-09 ENCOUNTER — Encounter (HOSPITAL_COMMUNITY)
Admission: RE | Disposition: A | Discharge status: Home Health | Payer: Self-pay | Source: Home / Self Care | Attending: Orthopedic Surgery

## 2024-03-09 ENCOUNTER — Encounter (HOSPITAL_COMMUNITY): Payer: Self-pay | Admitting: Orthopedic Surgery

## 2024-03-09 ENCOUNTER — Other Ambulatory Visit: Payer: Self-pay

## 2024-03-09 ENCOUNTER — Ambulatory Visit (HOSPITAL_COMMUNITY): Admission: RE | Admit: 2024-03-09 | Source: Ambulatory Visit | Attending: Cardiology | Admitting: Cardiology

## 2024-03-09 DIAGNOSIS — Z91030 Bee allergy status: Secondary | ICD-10-CM

## 2024-03-09 DIAGNOSIS — I10 Essential (primary) hypertension: Secondary | ICD-10-CM | POA: Diagnosis not present

## 2024-03-09 DIAGNOSIS — Z888 Allergy status to other drugs, medicaments and biological substances status: Secondary | ICD-10-CM

## 2024-03-09 DIAGNOSIS — I2510 Atherosclerotic heart disease of native coronary artery without angina pectoris: Secondary | ICD-10-CM | POA: Diagnosis not present

## 2024-03-09 DIAGNOSIS — Z96611 Presence of right artificial shoulder joint: Secondary | ICD-10-CM | POA: Diagnosis not present

## 2024-03-09 DIAGNOSIS — Z881 Allergy status to other antibiotic agents status: Secondary | ICD-10-CM

## 2024-03-09 DIAGNOSIS — K219 Gastro-esophageal reflux disease without esophagitis: Secondary | ICD-10-CM | POA: Diagnosis not present

## 2024-03-09 DIAGNOSIS — Z981 Arthrodesis status: Secondary | ICD-10-CM

## 2024-03-09 DIAGNOSIS — Z79890 Hormone replacement therapy (postmenopausal): Secondary | ICD-10-CM

## 2024-03-09 DIAGNOSIS — E785 Hyperlipidemia, unspecified: Secondary | ICD-10-CM | POA: Diagnosis not present

## 2024-03-09 DIAGNOSIS — Z96641 Presence of right artificial hip joint: Secondary | ICD-10-CM | POA: Diagnosis present

## 2024-03-09 DIAGNOSIS — G8918 Other acute postprocedural pain: Secondary | ICD-10-CM | POA: Diagnosis not present

## 2024-03-09 DIAGNOSIS — J309 Allergic rhinitis, unspecified: Secondary | ICD-10-CM | POA: Diagnosis present

## 2024-03-09 DIAGNOSIS — Z87891 Personal history of nicotine dependence: Secondary | ICD-10-CM | POA: Diagnosis not present

## 2024-03-09 DIAGNOSIS — Z7951 Long term (current) use of inhaled steroids: Secondary | ICD-10-CM | POA: Diagnosis not present

## 2024-03-09 DIAGNOSIS — E039 Hypothyroidism, unspecified: Secondary | ICD-10-CM | POA: Diagnosis not present

## 2024-03-09 DIAGNOSIS — Z7982 Long term (current) use of aspirin: Secondary | ICD-10-CM

## 2024-03-09 DIAGNOSIS — Z885 Allergy status to narcotic agent status: Secondary | ICD-10-CM | POA: Diagnosis not present

## 2024-03-09 DIAGNOSIS — F411 Generalized anxiety disorder: Secondary | ICD-10-CM | POA: Diagnosis not present

## 2024-03-09 DIAGNOSIS — Z880 Allergy status to penicillin: Secondary | ICD-10-CM

## 2024-03-09 DIAGNOSIS — F32A Depression, unspecified: Secondary | ICD-10-CM | POA: Diagnosis present

## 2024-03-09 DIAGNOSIS — G3184 Mild cognitive impairment, so stated: Secondary | ICD-10-CM | POA: Diagnosis not present

## 2024-03-09 DIAGNOSIS — R32 Unspecified urinary incontinence: Secondary | ICD-10-CM | POA: Diagnosis not present

## 2024-03-09 DIAGNOSIS — M1712 Unilateral primary osteoarthritis, left knee: Secondary | ICD-10-CM

## 2024-03-09 DIAGNOSIS — Z9049 Acquired absence of other specified parts of digestive tract: Secondary | ICD-10-CM

## 2024-03-09 DIAGNOSIS — H9193 Unspecified hearing loss, bilateral: Secondary | ICD-10-CM | POA: Diagnosis present

## 2024-03-09 DIAGNOSIS — Z8249 Family history of ischemic heart disease and other diseases of the circulatory system: Secondary | ICD-10-CM | POA: Diagnosis not present

## 2024-03-09 DIAGNOSIS — Z96652 Presence of left artificial knee joint: Principal | ICD-10-CM

## 2024-03-09 DIAGNOSIS — Z79899 Other long term (current) drug therapy: Secondary | ICD-10-CM

## 2024-03-09 DIAGNOSIS — M25762 Osteophyte, left knee: Secondary | ICD-10-CM | POA: Diagnosis not present

## 2024-03-09 HISTORY — PX: TOTAL KNEE ARTHROPLASTY: SHX125

## 2024-03-09 SURGERY — ARTHROPLASTY, KNEE, TOTAL
Anesthesia: General | Site: Knee | Laterality: Left

## 2024-03-09 MED ORDER — TRANEXAMIC ACID-NACL 1000-0.7 MG/100ML-% IV SOLN
1000.0000 mg | INTRAVENOUS | Status: AC
  Administered 2024-03-09: 1000 mg via INTRAVENOUS
  Filled 2024-03-09: qty 100

## 2024-03-09 MED ORDER — FLUOXETINE HCL 10 MG PO CAPS: 10.0000 mg | ORAL_CAPSULE | Freq: Every day | ORAL | Status: DC

## 2024-03-09 MED ORDER — OXYCODONE HCL 5 MG PO TABS
2.5000 mg | ORAL_TABLET | ORAL | Status: DC | PRN
  Administered 2024-03-09 – 2024-03-12 (×11): 5 mg via ORAL
  Filled 2024-03-09 (×9): qty 1

## 2024-03-09 MED ORDER — METOCLOPRAMIDE HCL 5 MG PO TABS: 5.0000 mg | ORAL_TABLET | Freq: Three times a day (TID) | ORAL | Status: DC | PRN

## 2024-03-09 MED ORDER — MENTHOL 3 MG MT LOZG: 1.0000 | LOZENGE | OROMUCOSAL | Status: DC | PRN

## 2024-03-09 MED ORDER — MIDAZOLAM HCL 2 MG/2ML IJ SOLN
INTRAMUSCULAR | Status: AC
  Filled 2024-03-09: qty 2

## 2024-03-09 MED ORDER — POVIDONE-IODINE 10 % EX SWAB
2.0000 | Freq: Once | CUTANEOUS | Status: AC
  Administered 2024-03-09: 2 via TOPICAL

## 2024-03-09 MED ORDER — CEFAZOLIN SODIUM-DEXTROSE 2-4 GM/100ML-% IV SOLN
2.0000 g | INTRAVENOUS | Status: AC
  Administered 2024-03-09: 2 g via INTRAVENOUS
  Filled 2024-03-09: qty 100

## 2024-03-09 MED ORDER — HYDROMORPHONE HCL 1 MG/ML IJ SOLN: 0.2500 mg | INTRAMUSCULAR | Status: DC | PRN

## 2024-03-09 MED ORDER — AMISULPRIDE (ANTIEMETIC) 5 MG/2ML IV SOLN: 10.0000 mg | Freq: Once | INTRAVENOUS | Status: DC | PRN

## 2024-03-09 MED ORDER — METHOCARBAMOL 1000 MG/10ML IJ SOLN: 500.0000 mg | Freq: Four times a day (QID) | INTRAMUSCULAR | Status: DC | PRN

## 2024-03-09 MED ORDER — METOCLOPRAMIDE HCL 5 MG/ML IJ SOLN: 5.0000 mg | Freq: Three times a day (TID) | INTRAMUSCULAR | Status: DC | PRN

## 2024-03-09 MED ORDER — ACETAMINOPHEN 500 MG PO TABS
1000.0000 mg | ORAL_TABLET | Freq: Four times a day (QID) | ORAL | Status: DC
Start: 2024-03-09 — End: 2024-03-13
  Administered 2024-03-09 – 2024-03-12 (×10): 1000 mg via ORAL
  Filled 2024-03-09 (×12): qty 2

## 2024-03-09 MED ORDER — 0.9 % SODIUM CHLORIDE (POUR BTL) OPTIME
TOPICAL | Status: DC | PRN
  Administered 2024-03-09: 1000 mL

## 2024-03-09 MED ORDER — SODIUM CHLORIDE (PF) 0.9 % IJ SOLN
INTRAMUSCULAR | Status: DC | PRN
  Administered 2024-03-09: 61 mL

## 2024-03-09 MED ORDER — PROPOFOL 10 MG/ML IV BOLUS
INTRAVENOUS | Status: AC
  Filled 2024-03-09: qty 20

## 2024-03-09 MED ORDER — KETOROLAC TROMETHAMINE 15 MG/ML IJ SOLN
7.5000 mg | Freq: Four times a day (QID) | INTRAMUSCULAR | Status: AC
  Administered 2024-03-09 (×2): 7.5 mg via INTRAVENOUS
  Filled 2024-03-09 (×2): qty 1

## 2024-03-09 MED ORDER — ACETAMINOPHEN 500 MG PO TABS
1000.0000 mg | ORAL_TABLET | Freq: Once | ORAL | Status: AC
  Administered 2024-03-09: 1000 mg via ORAL
  Filled 2024-03-09: qty 2

## 2024-03-09 MED ORDER — HYDROMORPHONE HCL 1 MG/ML IJ SOLN: 0.5000 mg | INTRAMUSCULAR | Status: DC | PRN

## 2024-03-09 MED ORDER — LACTATED RINGERS IV SOLN: INTRAVENOUS | Status: DC

## 2024-03-09 MED ORDER — ONDANSETRON HCL 4 MG/2ML IJ SOLN: 4.0000 mg | Freq: Four times a day (QID) | INTRAMUSCULAR | Status: DC | PRN

## 2024-03-09 MED ORDER — DEXAMETHASONE SODIUM PHOSPHATE 10 MG/ML IJ SOLN
8.0000 mg | Freq: Once | INTRAMUSCULAR | Status: AC
  Administered 2024-03-09: 8 mg via INTRAVENOUS

## 2024-03-09 MED ORDER — CEFAZOLIN SODIUM-DEXTROSE 2-4 GM/100ML-% IV SOLN
2.0000 g | Freq: Four times a day (QID) | INTRAVENOUS | Status: AC
  Administered 2024-03-09 (×2): 2 g via INTRAVENOUS
  Filled 2024-03-09 (×2): qty 100

## 2024-03-09 MED ORDER — METHOCARBAMOL 500 MG PO TABS
500.0000 mg | ORAL_TABLET | Freq: Four times a day (QID) | ORAL | Status: DC | PRN
  Administered 2024-03-10 – 2024-03-12 (×4): 500 mg via ORAL
  Filled 2024-03-09 (×4): qty 1

## 2024-03-09 MED ORDER — ONDANSETRON HCL 4 MG/2ML IJ SOLN
INTRAMUSCULAR | Status: DC | PRN
  Administered 2024-03-09: 4 mg via INTRAVENOUS

## 2024-03-09 MED ORDER — ROCURONIUM BROMIDE 100 MG/10ML IV SOLN
INTRAVENOUS | Status: DC | PRN
  Administered 2024-03-09: 10 mg via INTRAVENOUS
  Administered 2024-03-09: 50 mg via INTRAVENOUS

## 2024-03-09 MED ORDER — PROPOFOL 10 MG/ML IV BOLUS
INTRAVENOUS | Status: DC | PRN
  Administered 2024-03-09: 100 mg via INTRAVENOUS
  Administered 2024-03-09: 100 ug/kg/min via INTRAVENOUS

## 2024-03-09 MED ORDER — ONDANSETRON HCL 4 MG PO TABS: 4.0000 mg | ORAL_TABLET | Freq: Four times a day (QID) | ORAL | Status: DC | PRN

## 2024-03-09 MED ORDER — PANTOPRAZOLE SODIUM 40 MG PO TBEC
40.0000 mg | DELAYED_RELEASE_TABLET | Freq: Two times a day (BID) | ORAL | Status: DC
  Administered 2024-03-09 – 2024-03-12 (×6): 40 mg via ORAL
  Filled 2024-03-09 (×6): qty 1

## 2024-03-09 MED ORDER — SODIUM CHLORIDE (PF) 0.9 % IJ SOLN
INTRAMUSCULAR | Status: AC
  Filled 2024-03-09: qty 50

## 2024-03-09 MED ORDER — FENTANYL CITRATE (PF) 250 MCG/5ML IJ SOLN
INTRAMUSCULAR | Status: AC
  Filled 2024-03-09: qty 5

## 2024-03-09 MED ORDER — SENNA 8.6 MG PO TABS
2.0000 | ORAL_TABLET | Freq: Every day | ORAL | Status: DC
  Administered 2024-03-09 – 2024-03-11 (×3): 17.2 mg via ORAL
  Filled 2024-03-09 (×3): qty 2

## 2024-03-09 MED ORDER — POLYETHYLENE GLYCOL 3350 17 G PO PACK
17.0000 g | PACK | Freq: Two times a day (BID) | ORAL | Status: DC
  Administered 2024-03-09 – 2024-03-11 (×5): 17 g via ORAL
  Filled 2024-03-09 (×6): qty 1

## 2024-03-09 MED ORDER — SODIUM CHLORIDE 0.9 % IR SOLN
Status: DC | PRN
  Administered 2024-03-09: 1000 mL

## 2024-03-09 MED ORDER — AMLODIPINE BESYLATE-VALSARTAN 5-160 MG PO TABS: 1.0000 | ORAL_TABLET | Freq: Every day | ORAL | Status: DC

## 2024-03-09 MED ORDER — HYDRALAZINE HCL 20 MG/ML IJ SOLN
INTRAMUSCULAR | Status: AC
  Filled 2024-03-09: qty 1

## 2024-03-09 MED ORDER — ROSUVASTATIN CALCIUM 10 MG PO TABS
10.0000 mg | ORAL_TABLET | Freq: Every day | ORAL | Status: DC
  Administered 2024-03-10 – 2024-03-12 (×3): 10 mg via ORAL
  Filled 2024-03-09 (×3): qty 1

## 2024-03-09 MED ORDER — FENTANYL CITRATE PF 50 MCG/ML IJ SOSY
PREFILLED_SYRINGE | INTRAMUSCULAR | Status: AC
  Administered 2024-03-09: 50 ug
  Filled 2024-03-09: qty 1

## 2024-03-09 MED ORDER — DEXAMETHASONE SODIUM PHOSPHATE 10 MG/ML IJ SOLN
INTRAMUSCULAR | Status: DC | PRN
  Administered 2024-03-09: 10 mg via PERINEURAL

## 2024-03-09 MED ORDER — MOMETASONE FURO-FORMOTEROL FUM 200-5 MCG/ACT IN AERO: 2.0000 | INHALATION_SPRAY | Freq: Two times a day (BID) | RESPIRATORY_TRACT | Status: DC

## 2024-03-09 MED ORDER — BUPIVACAINE-EPINEPHRINE (PF) 0.25% -1:200000 IJ SOLN
INTRAMUSCULAR | Status: AC
  Filled 2024-03-09: qty 30

## 2024-03-09 MED ORDER — DEXAMETHASONE SODIUM PHOSPHATE 10 MG/ML IJ SOLN
10.0000 mg | Freq: Once | INTRAMUSCULAR | Status: AC
  Administered 2024-03-10: 10 mg via INTRAVENOUS
  Filled 2024-03-09: qty 1

## 2024-03-09 MED ORDER — HYDRALAZINE HCL 20 MG/ML IJ SOLN
5.0000 mg | INTRAMUSCULAR | Status: DC | PRN
Start: 2024-03-09 — End: 2024-03-09
  Administered 2024-03-09: 5 mg via INTRAVENOUS

## 2024-03-09 MED ORDER — KETOROLAC TROMETHAMINE 30 MG/ML IJ SOLN
INTRAMUSCULAR | Status: AC
  Filled 2024-03-09: qty 1

## 2024-03-09 MED ORDER — FLUOXETINE HCL 40 MG PO CAPS: 40.0000 mg | ORAL_CAPSULE | Freq: Every day | ORAL | Status: DC

## 2024-03-09 MED ORDER — BISACODYL 10 MG RE SUPP: 10.0000 mg | Freq: Every day | RECTAL | Status: DC | PRN

## 2024-03-09 MED ORDER — ALUM & MAG HYDROXIDE-SIMETH 200-200-20 MG/5ML PO SUSP: 30.0000 mL | ORAL | Status: DC | PRN

## 2024-03-09 MED ORDER — ROCURONIUM BROMIDE 10 MG/ML (PF) SYRINGE
PREFILLED_SYRINGE | INTRAVENOUS | Status: AC
  Filled 2024-03-09: qty 10

## 2024-03-09 MED ORDER — OXYCODONE HCL 5 MG/5ML PO SOLN: 5.0000 mg | Freq: Once | ORAL | Status: DC | PRN

## 2024-03-09 MED ORDER — HYDRALAZINE HCL 20 MG/ML IJ SOLN
INTRAMUSCULAR | Status: AC
Start: 2024-03-09 — End: 2024-03-09
  Filled 2024-03-09: qty 1

## 2024-03-09 MED ORDER — CHLORHEXIDINE GLUCONATE 0.12 % MT SOLN: 15.0000 mL | Freq: Once | OROMUCOSAL | Status: AC

## 2024-03-09 MED ORDER — DIPHENHYDRAMINE HCL 12.5 MG/5ML PO ELIX: 12.5000 mg | ORAL_SOLUTION | ORAL | Status: DC | PRN

## 2024-03-09 MED ORDER — DEXAMETHASONE SODIUM PHOSPHATE 10 MG/ML IJ SOLN
INTRAMUSCULAR | Status: AC
  Filled 2024-03-09: qty 1

## 2024-03-09 MED ORDER — LIDOCAINE HCL (CARDIAC) PF 100 MG/5ML IV SOSY
PREFILLED_SYRINGE | INTRAVENOUS | Status: DC | PRN
  Administered 2024-03-09: 60 mg via INTRAVENOUS

## 2024-03-09 MED ORDER — PHENOL 1.4 % MT LIQD: 1.0000 | OROMUCOSAL | Status: DC | PRN

## 2024-03-09 MED ORDER — LAMOTRIGINE 100 MG PO TABS
200.0000 mg | ORAL_TABLET | Freq: Every day | ORAL | Status: DC
  Administered 2024-03-09 – 2024-03-11 (×3): 200 mg via ORAL
  Filled 2024-03-09 (×3): qty 2

## 2024-03-09 MED ORDER — IRBESARTAN 150 MG PO TABS
150.0000 mg | ORAL_TABLET | Freq: Every day | ORAL | Status: DC
  Administered 2024-03-09 – 2024-03-12 (×4): 150 mg via ORAL
  Filled 2024-03-09 (×4): qty 1

## 2024-03-09 MED ORDER — PROPOFOL 1000 MG/100ML IV EMUL
INTRAVENOUS | Status: AC
  Filled 2024-03-09: qty 100

## 2024-03-09 MED ORDER — FLUOXETINE HCL 10 MG PO CAPS
10.0000 mg | ORAL_CAPSULE | Freq: Every day | ORAL | Status: DC
Start: 2024-03-09 — End: 2024-03-12
  Administered 2024-03-10 – 2024-03-12 (×3): 10 mg via ORAL
  Filled 2024-03-09 (×4): qty 1

## 2024-03-09 MED ORDER — LAMOTRIGINE 100 MG PO TABS
100.0000 mg | ORAL_TABLET | Freq: Every day | ORAL | Status: DC
  Administered 2024-03-10 – 2024-03-12 (×3): 100 mg via ORAL
  Filled 2024-03-09 (×3): qty 1

## 2024-03-09 MED ORDER — TRANEXAMIC ACID-NACL 1000-0.7 MG/100ML-% IV SOLN
1000.0000 mg | Freq: Once | INTRAVENOUS | Status: AC
  Administered 2024-03-09: 1000 mg via INTRAVENOUS
  Filled 2024-03-09: qty 100

## 2024-03-09 MED ORDER — FENTANYL CITRATE (PF) 100 MCG/2ML IJ SOLN
INTRAMUSCULAR | Status: DC | PRN
  Administered 2024-03-09: 25 ug via INTRAVENOUS
  Administered 2024-03-09: 50 ug via INTRAVENOUS
  Administered 2024-03-09 (×2): 25 ug via INTRAVENOUS
  Administered 2024-03-09 (×2): 50 ug via INTRAVENOUS
  Administered 2024-03-09: 25 ug via INTRAVENOUS

## 2024-03-09 MED ORDER — OXYCODONE HCL 5 MG PO TABS: 5.0000 mg | ORAL_TABLET | Freq: Once | ORAL | Status: DC | PRN

## 2024-03-09 MED ORDER — AMLODIPINE BESYLATE 5 MG PO TABS
5.0000 mg | ORAL_TABLET | Freq: Every day | ORAL | Status: DC
Start: 2024-03-09 — End: 2024-03-12
  Administered 2024-03-09 – 2024-03-12 (×4): 5 mg via ORAL
  Filled 2024-03-09 (×4): qty 1

## 2024-03-09 MED ORDER — LEVOTHYROXINE SODIUM 100 MCG PO TABS
100.0000 ug | ORAL_TABLET | Freq: Every day | ORAL | Status: DC
  Administered 2024-03-10 – 2024-03-12 (×3): 100 ug via ORAL
  Filled 2024-03-09 (×3): qty 1

## 2024-03-09 MED ORDER — ONDANSETRON HCL 4 MG/2ML IJ SOLN
INTRAMUSCULAR | Status: AC
  Filled 2024-03-09: qty 2

## 2024-03-09 MED ORDER — OXYCODONE HCL 5 MG PO TABS
5.0000 mg | ORAL_TABLET | ORAL | Status: DC | PRN
  Administered 2024-03-09: 5 mg via ORAL
  Administered 2024-03-11: 10 mg via ORAL
  Filled 2024-03-09 (×2): qty 2
  Filled 2024-03-09: qty 1

## 2024-03-09 MED ORDER — ALPRAZOLAM 0.25 MG PO TABS
0.2500 mg | ORAL_TABLET | Freq: Two times a day (BID) | ORAL | Status: DC | PRN
  Administered 2024-03-10: 0.25 mg via ORAL
  Filled 2024-03-09: qty 1

## 2024-03-09 MED ORDER — BUPROPION HCL ER (XL) 300 MG PO TB24
300.0000 mg | ORAL_TABLET | Freq: Every day | ORAL | Status: DC
  Administered 2024-03-10 – 2024-03-12 (×3): 300 mg via ORAL
  Filled 2024-03-09 (×3): qty 1

## 2024-03-09 MED ORDER — ALBUTEROL SULFATE (2.5 MG/3ML) 0.083% IN NEBU: 3.0000 mL | INHALATION_SOLUTION | Freq: Four times a day (QID) | RESPIRATORY_TRACT | Status: DC | PRN

## 2024-03-09 MED ORDER — SODIUM CHLORIDE 0.9 % IV SOLN: INTRAVENOUS | Status: DC

## 2024-03-09 MED ORDER — ONDANSETRON HCL 4 MG/2ML IJ SOLN: 4.0000 mg | Freq: Once | INTRAMUSCULAR | Status: DC | PRN

## 2024-03-09 MED ORDER — SUGAMMADEX SODIUM 200 MG/2ML IV SOLN
INTRAVENOUS | Status: AC
  Filled 2024-03-09: qty 4

## 2024-03-09 MED ORDER — FLUOXETINE HCL 20 MG PO CAPS
40.0000 mg | ORAL_CAPSULE | Freq: Every day | ORAL | Status: DC
Start: 2024-03-09 — End: 2024-03-12
  Administered 2024-03-10 – 2024-03-12 (×3): 40 mg via ORAL
  Filled 2024-03-09 (×4): qty 2

## 2024-03-09 MED ORDER — LIDOCAINE HCL (PF) 2 % IJ SOLN
INTRAMUSCULAR | Status: AC
  Filled 2024-03-09: qty 5

## 2024-03-09 MED ORDER — ORAL CARE MOUTH RINSE
15.0000 mL | Freq: Once | OROMUCOSAL | Status: AC
  Administered 2024-03-09: 15 mL via OROMUCOSAL

## 2024-03-09 MED ORDER — ASPIRIN 81 MG PO CHEW
81.0000 mg | CHEWABLE_TABLET | Freq: Two times a day (BID) | ORAL | Status: DC
  Administered 2024-03-09 – 2024-03-12 (×6): 81 mg via ORAL
  Filled 2024-03-09 (×6): qty 1

## 2024-03-09 MED ORDER — SUGAMMADEX SODIUM 200 MG/2ML IV SOLN
INTRAVENOUS | Status: DC | PRN
  Administered 2024-03-09: 300 mg via INTRAVENOUS

## 2024-03-09 MED ORDER — ROPIVACAINE HCL 5 MG/ML IJ SOLN
INTRAMUSCULAR | Status: DC | PRN
  Administered 2024-03-09: 30 mL via PERINEURAL

## 2024-03-09 SURGICAL SUPPLY — 42 items
ATTUNE MED ANAT PAT 32 KNEE (Knees) IMPLANT
BAG ZIPLOCK 12X15 (MISCELLANEOUS) ×1 IMPLANT
BASEPLATE TIB CMT FB PCKT SZ4 (Stem) IMPLANT
BLADE SAW SGTL 11.0X1.19X90.0M (BLADE) IMPLANT
BLADE SAW SGTL 13.0X1.19X90.0M (BLADE) ×1 IMPLANT
BNDG ELASTIC 6INX 5YD STR LF (GAUZE/BANDAGES/DRESSINGS) ×1 IMPLANT
BOWL SMART MIX CTS (DISPOSABLE) ×1 IMPLANT
CEMENT HV SMART SET (Cement) ×2 IMPLANT
COMPONENT FEM CMT ATTUNE 3 LT (Joint) IMPLANT
COOLER ICEMAN CLASSIC (MISCELLANEOUS) IMPLANT
COVER SURGICAL LIGHT HANDLE (MISCELLANEOUS) ×1 IMPLANT
CUFF TRNQT CYL 34X4.125X (TOURNIQUET CUFF) ×1 IMPLANT
DERMABOND ADVANCED .7 DNX12 (GAUZE/BANDAGES/DRESSINGS) ×1 IMPLANT
DRAPE U-SHAPE 47X51 STRL (DRAPES) ×1 IMPLANT
DRESSING AQUACEL AG SP 3.5X10 (GAUZE/BANDAGES/DRESSINGS) ×1 IMPLANT
DURAPREP 26ML APPLICATOR (WOUND CARE) ×2 IMPLANT
ELECT REM PT RETURN 15FT ADLT (MISCELLANEOUS) ×1 IMPLANT
GLOVE BIO SURGEON STRL SZ 6 (GLOVE) ×1 IMPLANT
GLOVE BIOGEL PI IND STRL 6.5 (GLOVE) ×1 IMPLANT
GLOVE BIOGEL PI IND STRL 7.5 (GLOVE) ×1 IMPLANT
GLOVE ORTHO TXT STRL SZ7.5 (GLOVE) ×2 IMPLANT
GOWN STRL REUS W/ TWL LRG LVL3 (GOWN DISPOSABLE) ×2 IMPLANT
KIT TURNOVER KIT A (KITS) ×1 IMPLANT
LINER TIB ATT KNEE MED 3 5 LT (Liner) IMPLANT
MANIFOLD NEPTUNE II (INSTRUMENTS) ×1 IMPLANT
NDL SAFETY ECLIPSE 18X1.5 (NEEDLE) IMPLANT
NS IRRIG 1000ML POUR BTL (IV SOLUTION) ×1 IMPLANT
PACK TOTAL KNEE CUSTOM (KITS) ×1 IMPLANT
PAD COLD SHLDR WRAP-ON (PAD) IMPLANT
PENCIL SMOKE EVACUATOR (MISCELLANEOUS) ×1 IMPLANT
PIN FIX SIGMA LCS THRD HI (PIN) IMPLANT
PROTECTOR NERVE ULNAR (MISCELLANEOUS) ×1 IMPLANT
SET HNDPC FAN SPRY TIP SCT (DISPOSABLE) ×1 IMPLANT
SET PAD KNEE POSITIONER (MISCELLANEOUS) ×1 IMPLANT
SPONGE T-LAP 18X18 ~~LOC~~+RFID (SPONGE) IMPLANT
SUT MNCRL AB 4-0 PS2 18 (SUTURE) ×1 IMPLANT
SUT STRATAFIX PDS+ 0 24IN (SUTURE) ×1 IMPLANT
SUT VIC AB 1 CT1 36 (SUTURE) ×1 IMPLANT
SUT VIC AB 2-0 CT1 TAPERPNT 27 (SUTURE) ×2 IMPLANT
TOWEL GREEN STERILE FF (TOWEL DISPOSABLE) ×1 IMPLANT
TUBE SUCTION HIGH CAP CLEAR NV (SUCTIONS) ×1 IMPLANT
WATER STERILE IRR 1000ML POUR (IV SOLUTION) ×2 IMPLANT

## 2024-03-09 NOTE — Transfer of Care (Signed)
 Immediate Anesthesia Transfer of Care Note  Patient: [name] [facility]  Procedure(s) Performed: ARTHROPLASTY, KNEE, TOTAL (Left: Knee)  Patient Location: PACU  Anesthesia Type:General  Level of Consciousness: drowsy  Airway & Oxygen Therapy: Patient Spontanous Breathing and Patient connected to nasal cannula oxygen  Post-op Assessment: Report given to RN and Post -op Vital signs reviewed and stable  Post vital signs: Reviewed and stable  Last Vitals:  Vitals Value Taken Time  BP 184/84 03/09/24 12:32  Temp    Pulse 71 03/09/24 12:34  Resp 15 03/09/24 12:34  SpO2 94 % 03/09/24 12:34  Vitals shown include unfiled device data.  Last Pain:  Vitals:   03/09/24 1000  TempSrc:   PainSc: Asleep         Complications: No notable events documented.

## 2024-03-09 NOTE — Op Note (Addendum)
 NAME:  [name] [facility]                      MEDICAL RECORD NO.:  [id number]                             FACILITY:  [facility]      PHYSICIAN:  [name], M.D.  DATE OF BIRTH:  04-04-1946      DATE OF PROCEDURE:  03/09/2024                                     OPERATIVE REPORT         PREOPERATIVE DIAGNOSIS:  Left knee osteoarthritis.      POSTOPERATIVE DIAGNOSIS:  Left knee osteoarthritis.      FINDINGS:  The patient was noted to have complete loss of cartilage and   bone-on-bone arthritis with associated osteophytes in the lateral and patellofemoral compartments of   the knee with a significant synovitis and associated effusion.  The patient had failed months of conservative treatment including medications, injection therapy, activity modification.     PROCEDURE:  Left total knee replacement.      COMPONENTS USED:  DePuy Attune FB CR MS knee   system, a size 3 femur, 4 tibia, size 5 mm CR MS AOX insert, and 32 anatomic patellar   button.      SURGEON:  [name], M.D.      ASSISTANT:  Tammy Calin, PA-C.      ANESTHESIA:  General and Regional.      SPECIMENS:  None.      COMPLICATION:  None.      DRAINS:  None.  EBL: 500 cc     TOURNIQUET TIME:  10 min at 225 mmHg     The patient was stable to the recovery room.      INDICATION FOR PROCEDURE:  [name] is a 78 y.o. female patient of   mine.  The patient had been seen, evaluated, and treated for months conservatively in the   office with medication, activity modification, and injections.  The patient had   radiographic changes of bone-on-bone arthritis with endplate sclerosis and osteophytes noted.  Based on the radiographic changes and failed conservative measures, the patient   decided to proceed with definitive treatment, total knee replacement.  Risks of infection, DVT, component failure, need for revision surgery, neurovascular injury were reviewed in the office setting.  The  postop course was reviewed stressing the efforts to maximize post-operative satisfaction and function.  Consent was obtained for benefit of pain   relief.      PROCEDURE IN DETAIL:  The patient was brought to the operative theater.   Once adequate anesthesia, preoperative antibiotics, 2 gm of Ancef ,1 gm of Tranexamic Acid , and 10 mg of Decadron  administered, the patient was positioned supine with a left thigh tourniquet placed.  The  left lower extremity was prepped and draped in sterile fashion.  A time-   out was performed identifying the patient, planned procedure, and the appropriate extremity.      The left lower extremity was placed in the [facility] leg holder.  The leg was   exsanguinated, tourniquet elevated to 225 mmHg.  A midline incision was   made followed by median parapatellar arthrotomy.  Following initial   exposure, attention was first  directed to the patella.  Precut   measurement was noted to be 20 mm.  I resected down to 13 mm and used a   32 anatomic patellar button to restore patellar height as well as cover the cut surface.      The lug holes were drilled and a metal shim was placed to protect the   patella from retractors and saw blade during the procedure.      At this point, attention was now directed to the femur.  The femoral   canal was opened with a drill, irrigated to try to prevent fat emboli.  An   intramedullary rod was passed at 3 degrees valgus, 9 mm of bone was   resected off the distal femur.  Following this resection, the tibia was   subluxated anteriorly.  Using the extramedullary guide, 2 mm of bone was resected off   the proximal medial tibia.  We confirmed the gap would be   stable medially and laterally with a size 5 spacer block as well as confirmed that the tibial cut was perpendicular in the coronal plane, checking with an alignment rod.      Once this was done, I sized the femur to be a size 3 in the anterior-   posterior dimension, chose a  standard component based on medial and   lateral dimension.  The size 3 rotation block was then pinned in   position anterior referenced using the C-clamp to set rotation.  The   anterior, posterior, and  chamfer cuts were made without difficulty nor   notching making certain that I was along the anterior cortex to help   with flexion gap stability.      The final box cut was made off the lateral aspect of distal femur.      At this point, the tibia was sized to be a size 4.  The size 4 tray was   then pinned in position through the medial third of the tubercle,   drilled, and keel punched.  Trial reduction was now carried with a 3 femur,  4 tibia, a size 5 mm CR MS insert, and the 32 anatomic patella botton.  The knee was brought to full extension with good flexion stability with the patella   tracking through the trochlea without application of pressure.  Given   all these findings the trial components removed.  Final components were   opened and cement was mixed.  The knee was irrigated with normal saline solution and pulse lavage.  The synovial lining was   then injected with 30 cc of 0.25% Marcaine  with epinephrine , 1 cc of Toradol  and 30 cc of NS for a total of 61 cc.     Final implants were then cemented onto cleaned and dried cut surfaces of bone with the knee brought to extension with a size 5 mm CR MS trial insert.      Once the cement had fully cured, excess cement was removed   throughout the knee.  I confirmed that I was satisfied with the range of   motion and stability, and the final size 5 mm CR MS AOX insert was chosen.  It was   placed into the knee.      The tourniquet had been let down at 10 minutes.  No significant   hemostasis was required.  The extensor mechanism was then reapproximated using #1 Vicryl and #1 Stratafix sutures with the knee   in flexion.  The   remaining wound was closed with 2-0 Vicryl and running 4-0 Monocryl.   The knee was cleaned, dried,  dressed sterilely using Dermabond and   Aquacel dressing.  The patient was then   brought to recovery room in stable condition, tolerating the procedure   well.   Please note that Physician Assistant, Tammy Calin, PA-C was present for the entirety of the case, and was utilized for pre-operative positioning, peri-operative retractor management, general facilitation of the procedure and for primary wound closure at the end of the case.              Tammy CORDOBA [name], M.D.    03/09/2024 9:49 AM

## 2024-03-09 NOTE — H&P (Signed)
 TOTAL KNEE ADMISSION H&P  Patient is being admitted for left total knee arthroplasty.  Therapy Plans: outpatient therapy at [facility] Disposition: Home alone (daughter for a few days) Planned DVT Prophylaxis: aspirin  81mg  BID DME needed: walker PCP: Dr. Loreli - clearance received Cardio: Clearance received TXA: IV Allergies: PCN, erythromycin, ampicillin - N/V Anesthesia Concerns: none BMI: 28.9 Last HgbA1c: Not diabetic   Other: - Has an aide from 9-3 post op - [facility] - No hx of VTE or cancer - Using tramadol  now - Oxycodone , robaxin , tylenol , celebrex   **Do not over medicate her**  Subjective:  Chief Complaint:left knee pain.  HPI: [name], 78 y.o. female, has a history of pain and functional disability in the left knee due to arthritis and has failed non-surgical conservative treatments for greater than 12 weeks to includeNSAID's and/or analgesics, corticosteriod injections, and activity modification.  Onset of symptoms was gradual, starting 2 years ago with gradually worsening course since that time. The patient noted no past surgery on the left knee(s).  Patient currently rates pain in the left knee(s) at 8 out of 10 with activity. Patient has worsening of pain with activity and weight bearing, pain that interferes with activities of daily living, and pain with passive range of motion.  Patient has evidence of joint space narrowing by imaging studies. There is no active infection.  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   Status post right hip replacement 07/07/2018   Acute encephalopathy 06/04/2018   Preop exam for internal medicine 12/07/2017   Chronic tonsillitis 12/05/2017   Postlaminectomy syndrome, lumbar region 07/03/2017   Chronic  lumbar radiculopathy 07/03/2017   Shingles outbreak 11/28/2016   Acute conjunctivitis of right eye 11/28/2016   Vision blurred 11/28/2016   Bilateral hearing loss 11/28/2016   Nasal sore 11/28/2016   Acute pharyngitis 07/03/2016   UTI (urinary tract infection) 11/30/2015   Abdominal pain 09/14/2015   Nausea without vomiting 07/31/2015   Chronic right hip pain 07/02/2015   Sciatica of right side 06/30/2015   Concussion without loss of consciousness 05/23/2015   Low back pain radiating to left lower extremity 12/22/2014   Dizziness 08/11/2014   DDD (degenerative disc disease), lumbosacral 04/21/2014   Renal cyst 01/28/2014   Microscopic hematuria 04/20/2013   Ulcerative colitis (HCC) 05/06/2012   Spinal disease 03/29/2012   Arthralgia 11/01/2011   Facial laceration 07/29/2011   Fall from standing 07/29/2011   Left shoulder pain 07/29/2011   Right wrist pain 07/29/2011   Osteoarthritis 08/17/2010   GERD 08/17/2010   Dyslipidemia 02/19/2008   ALLERGIC RHINITIS CAUSE UNSPECIFIED 05/26/2007   Hypothyroidism 03/21/2007   Anxiety state 03/21/2007   Depression 03/21/2007   Attention deficit disorder of adult 03/21/2007   Essential hypertension 03/21/2007   Asthma 03/21/2007   Irritable bowel syndrome 03/21/2007   Past Medical History:  Diagnosis Date   ADD (attention deficit disorder)    Allergy    Anxiety    Asthma    Attention deficit disorder without mention of hyperactivity    Back pain    Colitis 12/05/2010   Colon polyps    Complication of anesthesia    problem with grogginess and a long time to get over Anesthesia   Depression    Diverticulosis    GERD (gastroesophageal reflux disease)  H/O hiatal hernia    Heart murmur    mild to moderate AS per ECHO 05-2023   Hypertension    Hypothyroidism    IBS (irritable bowel syndrome)    MCI (mild cognitive impairment)    Memory changes    Osteoarthrosis, unspecified whether generalized or localized, unspecified site     RA   Other and unspecified hyperlipidemia    Renal cyst 10/2015   simple left   Repeated falls    Thyroid  disease    Ulcerative colitis    Unspecified asthma(493.90)    Unspecified essential hypertension    taken off meds since lost wt    Past Surgical History:  Procedure Laterality Date   ABDOMINAL EXPOSURE N/A 04/22/2014   Procedure: ABDOMINAL EXPOSURE;  Surgeon: Lonni GORMAN Blade, MD;  Location: [facility];  Service: Vascular;  Laterality: N/A;   ANTERIOR LATERAL LUMBAR FUSION 4 LEVELS Left 04/22/2014   Procedure: Left Lumbar one/two, two/three, three/four, four,five. Anterior lateral lumbar interbody fusion**Stage 1**;  Surgeon: Fairy Levels, MD;  Location: [facility];  Service: Neurosurgery;  Laterality: Left;   ANTERIOR LUMBAR FUSION N/A 04/22/2014   Procedure: Lumbar five/-Sacral one. Anterior lumbar interbody fusion with Dr. Blade; Left Lumbar one/two, two/three/three/four, four/five. Anterior lateral lumbar interbody fusion**Stage 1**;  Surgeon: Fairy Levels, MD;  Location: [facility];  Service: Neurosurgery;  Laterality: N/A;   BACK SURGERY     fluid removed from between disks   BREAST SURGERY     bil breast reduction    CARPAL TUNNEL RELEASE Bilateral    CHOLECYSTECTOMY     Laparoscopic cholecystectomy   COLONOSCOPY     HIP FRACTURE SURGERY     left  11/01/2013   LUMBAR PERCUTANEOUS PEDICLE SCREW 4 LEVEL N/A 04/25/2014   Procedure: **Stage 2** Percutaneous pedicle screw placement L1-5;  Surgeon: Fairy Levels, MD;  Location: [facility];  Service: Neurosurgery;  Laterality: N/A;  **Stage 2** Percutaneous pedicle screw placement L1-5   SHOULDER SURGERY     right shoulder 3x    TOE SURGERY     right big toe   TOTAL HIP ARTHROPLASTY Right 07/07/2018   Procedure: TOTAL HIP ARTHROPLASTY ANTERIOR APPROACH;  Surgeon: Ernie Cough, MD;  Location: [facility];  Service: Orthopedics;  Laterality: Right;  70 mins   TOTAL SHOULDER ARTHROPLASTY Right    Reverse total    UPPER GASTROINTESTINAL ENDOSCOPY  09/28/2016   Dilation    No current facility-administered medications for this encounter.   Current Outpatient Medications  Medication Sig Dispense Refill Last Dose/Taking   albuterol  (VENTOLIN  HFA) 108 (90 Base) MCG/ACT inhaler Inhale 2 puffs into the lungs every 6 (six) hours as needed for wheezing or shortness of breath. 18 g 3 Taking As Needed   ALPRAZolam  (XANAX ) 0.25 MG tablet Take 0.25 mg by mouth 2 (two) times daily as needed for anxiety or sleep.   Taking As Needed   amLODipine -valsartan  (EXFORGE ) 5-160 MG tablet Take 1 tablet by mouth daily. 90 tablet 1 Taking   Bempedoic Acid-Ezetimibe (NEXLIZET ) 180-10 MG TABS TAKE ONE TABLET DAILY AT 12 NOON 90 tablet 2 Taking   budesonide -formoterol  (SYMBICORT ) 160-4.5 MCG/ACT inhaler Inhale 2 puffs into the lungs 2 (two) times daily. in the morning and at bedtime. 10.2 g 5 Taking   buPROPion  (WELLBUTRIN  XL) 300 MG 24 hr tablet Take 1 tablet (300 mg total) by mouth daily. 90 tablet 1 Taking   celecoxib  (CELEBREX ) 200 MG capsule Take 200 mg by mouth daily.  Taking   Cholecalciferol  (VITAMIN D  PO) Take 1 tablet by mouth daily.   Taking   CONCERTA  36 MG CR tablet Take 36 mg by mouth daily.    Taking   FLUoxetine  (PROZAC ) 10 MG capsule Take 10 mg by mouth daily.   Taking   FLUoxetine  (PROZAC ) 40 MG capsule Take 40 mg by mouth daily.   Taking   lamoTRIgine  (LAMICTAL ) 100 MG tablet Take 100-200 mg by mouth See admin instructions. Take 100 mg by mouth in the morning and 200 mg at bedtime   Taking   loratadine (CLARITIN) 10 MG tablet Take 10 mg by mouth daily.   Taking   methocarbamol  (ROBAXIN ) 500 MG tablet Take 1 tablet (500 mg total) by mouth every 6 (six) hours as needed for muscle spasms. 40 tablet 0 Taking As Needed   pantoprazole  (PROTONIX ) 40 MG tablet TAKE ONE TABLET TWICE DAILY 60 tablet 11 Taking   rosuvastatin  (CRESTOR ) 10 MG tablet Take 10 mg by mouth daily.    Taking   SYNTHROID  50 MCG tablet TAKE 2  TABLETS EVERY MORNING (Patient taking differently: Take 100 mcg by mouth daily before breakfast.) 180 tablet 1 Taking Differently   aspirin  EC 81 MG tablet Take 1 tablet (81 mg total) by mouth daily. Swallow whole. (Patient not taking: Reported on 02/27/2024) 30 tablet 11 Not Taking   bacitracin  ointment Apply 1 Application topically 2 (two) times daily. (Patient not taking: Reported on 02/27/2024) 120 g 0 Not Taking   cefadroxil  (DURICEF) 500 MG capsule Take 1 capsule (500 mg total) by mouth 2 (two) times daily. (Patient not taking: Reported on 02/27/2024) 12 capsule 0 Not Taking   FLUoxetine  (PROZAC ) 20 MG capsule Take 3 capsules (60 mg total) by mouth daily. (Patient not taking: Reported on 02/27/2024) 90 capsule 3 Not Taking   HYDROcodone -acetaminophen  (NORCO/VICODIN) 5-325 MG tablet Take 1-2 tablets by mouth every 6 (six) hours as needed for severe pain (pain score 7-10). (Patient not taking: Reported on 02/27/2024) 10 tablet 0 Not Taking   inFLIXimab  (REMICADE ) 100 MG injection IV infusion every 2 months      Spacer/Aero-Holding Chambers (AEROCHAMBER MV) inhaler Use as instructed 1 each 0    Allergies  Allergen Reactions   Ampicillin Nausea And Vomiting   Bee Pollen     Other reaction(s): Other (See Comments)   Erythromycin Nausea And Vomiting and Other (See Comments)    Can take Z pac   Hctz [Hydrochlorothiazide ] Other (See Comments)    dizzy   Metoprolol  Other (See Comments)    doublevision    Molds & Smuts Other (See Comments)    Pt coughs and uses an inhaler.   Morphine And Codeine Nausea And Vomiting    No problem with hydrocodone    Penicillin G Other (See Comments)    Unknown Has patient had a PCN reaction causing immediate rash, facial/tongue/throat swelling, SOB or lightheadedness with hypotension: Unknown Has patient had a PCN reaction causing severe rash involving mucus membranes or skin necrosis: Unknown Has patient had a PCN reaction that required hospitalization: Unknown Has  patient had a PCN reaction occurring within the last 10 years: Unknown If all of the above answers are NO, then may proceed with Cephalosporin use.     Social History   Tobacco Use   Smoking status: Former    Current packs/day: 0.00    Types: Cigarettes    Start date: 10/30/1963    Quit date: 10/29/1973    Years since quitting: 50.3  Smokeless tobacco: Never  Substance Use Topics   Alcohol  use: Yes    Alcohol /week: 1.0 standard drink of alcohol     Types: 1 Standard drinks or equivalent per week    Comment: once a week    Family History  Problem Relation Age of Onset   Heart disease Mother        Died, 8   Hypertension Father    Colon cancer Father 43   Arthritis Father    Heart disease Father        CHF   Healthy Brother    Healthy Daughter    Diabetes Neg Hx    Breast cancer Neg Hx      Review of Systems  Constitutional:  Negative for chills and fever.  Respiratory:  Negative for cough and shortness of breath.   Cardiovascular:  Negative for chest pain.  Gastrointestinal:  Negative for nausea and vomiting.  Musculoskeletal:  Positive for arthralgias.     Objective:  Physical Exam Musculoskeletal: Right knee exam: Right knee valgus with slight flexion contracture Passively correctable valgus with pain Pain over the lateral and anterior aspect knee Flexion close to 110 degrees with tightness over the anterior lateral aspect knee with associated pain Left knee exam: Neutral slight varus as compared to her right knee Tenderness over the medial and anterior aspect knee Slight flexion contracture with flexion of 110 degrees No significant lower extremity edema, erythema or calf tenderness  Vital signs in last 24 hours:    Labs:   Estimated body mass index is 26.63 kg/m as calculated from the following:   Height as of 03/03/24: 5' 5 (1.651 m).   Weight as of 03/03/24: 72.6 kg.   Imaging Review Plain radiographs demonstrate severe degenerative joint  disease of the left knee(s). The overall alignment isneutral. The bone quality appears to be adequate for age and reported activity level.      Assessment/Plan:  End stage arthritis, left knee   The patient history, physical examination, clinical judgment of the provider and imaging studies are consistent with end stage degenerative joint disease of the left knee(s) and total knee arthroplasty is deemed medically necessary. The treatment options including medical management, injection therapy arthroscopy and arthroplasty were discussed at length. The risks and benefits of total knee arthroplasty were presented and reviewed. The risks due to aseptic loosening, infection, stiffness, patella tracking problems, thromboembolic complications and other imponderables were discussed. The patient acknowledged the explanation, agreed to proceed with the plan and consent was signed. Patient is being admitted for inpatient treatment for surgery, pain control, PT, OT, prophylactic antibiotics, VTE prophylaxis, progressive ambulation and ADL's and discharge planning. The patient is planning to be discharged home.     Patient's anticipated LOS is less than 2 midnights, meeting these requirements: - Younger than 31 - Lives within 1 hour of care - Has a competent adult at home to recover with post-op recover - NO history of  - Chronic pain requiring opiods  - Diabetes  - Coronary Artery Disease  - Heart failure  - Heart attack  - Stroke  - DVT/VTE  - Cardiac arrhythmia  - Respiratory Failure/COPD  - Renal failure  - Anemia  - Advanced Liver disease  Rosina Calin, PA-C Orthopedic Surgery [facility] [place] [phone]

## 2024-03-09 NOTE — Evaluation (Signed)
 Physical Therapy Evaluation Patient Details Name: [name] MRN: [id number] DOB: Aug 05, 1945 Today's Date: 03/09/2024  History of Present Illness  78 yo female presents to therapy s/p L TKA on 03/09/2024 due to failure of conservative measures. Pt PMH includes but is not limited to: OP, carpal tunnel syndrome, R THA, AA (2020), HOH, chronic pain, DDD lumbosacral region s/p surgery, GERD, anxiety, asthma, IBS, HTN, hypothyroidism, falls, L hip fx and surgery (2015), and R reverse TSA.  Clinical Impression     Nealie Mchatton is a 78 y.o. female POD 0 s/p L TKA. Patient reports mod I for gait tasks and some assist with ADLs, household management and IADLs with mobility at baseline. Patient is now limited by functional impairments (see PT problem list below) and requires mod A x 2  for bed mobility and mod A x 2 for sit to stand  and total A to complete SPT to recliner. Patient will benefit from continued skilled PT interventions to address impairments and progress towards PLOF. Acute PT will follow to progress mobility and stair training in preparation for safe discharge home with family support, PCA and HH services.       If plan is discharge home, recommend the following: Two people to help with walking and/or transfers;Two people to help with bathing/dressing/bathroom;Assistance with cooking/housework;Assist for transportation   Can travel by private vehicle        Equipment Recommendations None recommended by PT  Recommendations for Other Services       Functional Status Assessment Patient has had a recent decline in their functional status and demonstrates the ability to make significant improvements in function in a reasonable and predictable amount of time.     Precautions / Restrictions Precautions Precautions: Fall;Knee Restrictions Weight Bearing Restrictions Per Provider Order: No      Mobility  Bed Mobility Overal bed mobility: Needs  Assistance Bed Mobility: Supine to Sit     Supine to sit: Mod assist, +2 for safety/equipment, +2 for physical assistance, HOB elevated, Used rails     General bed mobility comments: increased time and cues, once in sitting able to scoot to EOB with min A    Transfers Overall transfer level: Needs assistance Equipment used: Rolling walker (2 wheels) Transfers: Sit to/from Stand, Bed to chair/wheelchair/BSC Sit to Stand: Min assist, From elevated surface Stand pivot transfers: Total assist, +2 physical assistance, +2 safety/equipment         General transfer comment: min A x 2 for power up and stabiltiy with pt able to push to stand, pt self limting L LE WB and LE placed in slight hip abduction, pt reporting that her arms were too tired to help hold her up s/p 3 steps anteriorly, pt required mod A  x 2 for stepping tasks with increased time specific cues for weight shifting and LE to advace and PT and rehab tech blocking RW from rolling forward for improved stabiltiy, pt unable to compelte pivoting task to recliner, thus PT rehab tech assisted pt to recliner via switch transfer moving the bed and replacing the recliner directly behind pt.    Ambulation/Gait               General Gait Details: NT due to extensive assist for transer tasks, reported L LE pain and UE fatigue with pt demonstrating excessive trunk flexion prior to being assisted to Best boy  Tilt Bed    Modified Rankin (Stroke Patients Only)       Balance Overall balance assessment: History of Falls, Needs assistance Sitting-balance support: Feet supported Sitting balance-Leahy Scale: Good     Standing balance support: Bilateral upper extremity supported, During functional activity, Reliant on assistive device for balance Standing balance-Leahy Scale: Zero                               Pertinent Vitals/Pain Pain Assessment Pain Assessment:  Faces Faces Pain Scale: Hurts whole lot Pain Location: L knee and LE Pain Descriptors / Indicators: Aching, Discomfort, Dull, Operative site guarding Pain Intervention(s): Limited activity within patient's tolerance, Monitored during session, Premedicated before session, Repositioned, Ice applied    Home Living Family/patient expects to be discharged to:: Private residence Living Arrangements: Alone Available Help at Discharge: Family;Personal care attendant (9 a- 3 p x 4 d wk) Type of Home: House Home Access: Ramped entrance       Home Layout: One level Home Equipment: Rollator (4 wheels);Rolling Walker (2 wheels);Cane - single point (walking stick) Additional Comments: has some old equipment from spouse whom passed secondary to ALS including hospital bed and motorized wc (no longer functional)    Prior Function Prior Level of Function : Independent/Modified Independent             Mobility Comments: pt reports mod I with use of walking stick until about 2 wks ago then needing to use the RW due to L knee pain, pt states PAC assists around the house and with IADLs as pt no longer drives       Extremity/Trunk Assessment        Lower Extremity Assessment Lower Extremity Assessment: LLE deficits/detail LLE Deficits / Details: ankle DF 4/5 and PF 3/5; SLR with AA LLE Sensation: decreased proprioception;decreased light touch    Cervical / Trunk Assessment Cervical / Trunk Assessment: Back Surgery  Communication   Communication Communication: Impaired Factors Affecting Communication: Hearing impaired    Cognition Arousal: Alert Behavior During Therapy: WFL for tasks assessed/performed (slight confustion and attributed to general anesthesia and medications)   PT - Cognitive impairments: Attention, Memory                         Following commands: Impaired Following commands impaired: Follows multi-step commands inconsistently     Cueing       General  Comments      Exercises     Assessment/Plan    PT Assessment Patient needs continued PT services  PT Problem List Decreased strength;Decreased range of motion;Decreased activity tolerance;Decreased balance;Decreased mobility;Decreased coordination;Decreased safety awareness;Pain       PT Treatment Interventions DME instruction;Gait training;Functional mobility training;Therapeutic exercise;Therapeutic activities;Balance training;Neuromuscular re-education;Patient/family education;Modalities    PT Goals (Current goals can be found in the Care Plan section)  Acute Rehab PT Goals Patient Stated Goal: to be able to move better at home PT Goal Formulation: With patient Time For Goal Achievement: 03/23/24 Potential to Achieve Goals: Good    Frequency 7X/week     Co-evaluation               AM-PAC PT 6 Clicks Mobility  Outcome Measure Help needed turning from your back to your side while in a flat bed without using bedrails?: A Little Help needed moving from lying on your back to sitting on the side of a flat bed without using  bedrails?: A Lot Help needed moving to and from a bed to a chair (including a wheelchair)?: A Lot Help needed standing up from a chair using your arms (e.g., wheelchair or bedside chair)?: A Lot Help needed to walk in hospital room?: Total Help needed climbing 3-5 steps with a railing? : Total 6 Click Score: 11    End of Session Equipment Utilized During Treatment: Gait belt Activity Tolerance: Patient limited by pain;Patient limited by fatigue Patient left: in chair;with call bell/phone within reach;with family/visitor present Nurse Communication: Mobility status PT Visit Diagnosis: Unsteadiness on feet (R26.81);Other abnormalities of gait and mobility (R26.89);Muscle weakness (generalized) (M62.81);Difficulty in walking, not elsewhere classified (R26.2);Pain Pain - Right/Left: Left Pain - part of body: Leg;Knee    Time: 8340-8270 PT Time  Calculation (min) (ACUTE ONLY): 30 min   Charges:   PT Evaluation $PT Eval Low Complexity: 1 Low PT Treatments $Therapeutic Activity: 8-22 mins PT General Charges $$ ACUTE PT VISIT: 1 Visit         [place], PT Acute Rehab   [place] VEAR Drone 03/09/2024, 5:46 PM

## 2024-03-09 NOTE — Progress Notes (Signed)
 Physical Therapy Treatment Patient Details Name: [name] MRN: [id number] DOB: 1945/06/27 Today's Date: 03/09/2024   History of Present Illness 78 yo female presents to therapy s/p L TKA on 03/09/2024 due to failure of conservative measures. Pt PMH includes but is not limited to: OP, carpal tunnel syndrome, R THA, AA (2020), HOH, chronic pain, DDD lumbosacral region s/p surgery, GERD, anxiety, asthma, IBS, HTN, hypothyroidism, falls, L hip fx and surgery (2015), and R reverse TSA.    PT Comments   Pt admitted with above diagnosis.  Pt currently with functional limitations due to the deficits listed below (see PT Problem List). PT returned 1 hr later to assist pt and staff with transfer tasks to return to bed. Pt required mod A x 2 and strong cues for safety, posture, encouragement, A to manage RW and facilitation for weight shifting, improved L knee stability noted. Pt required mod A for sit to supine and able to reposition toward [facility] with cues. Pt left in bed, all needs in place and ice man machine donned. Pt will benefit from acute skilled PT to increase their independence and safety with mobility to allow discharge.      If plan is discharge home, recommend the following: Two people to help with walking and/or transfers;Two people to help with bathing/dressing/bathroom;Assistance with cooking/housework;Assist for transportation   Can travel by private vehicle        Equipment Recommendations  None recommended by PT    Recommendations for Other Services       Precautions / Restrictions Precautions Precautions: Fall;Knee Restrictions Weight Bearing Restrictions Per Provider Order: No     Mobility  Bed Mobility Overal bed mobility: Needs Assistance Bed Mobility: Sit to Supine     Supine to sit: Mod assist, +2 for safety/equipment, +2 for physical assistance, HOB elevated, Used rails Sit to supine: Mod assist   General bed mobility comments: increased time and  cues to flat surface pt able to scoot toward [facility]    Transfers Overall transfer level: Needs assistance Equipment used: Rolling walker (2 wheels) Transfers: Bed to chair/wheelchair/BSC Sit to Stand: Min assist, From elevated surface Stand pivot transfers: +2 physical assistance, +2 safety/equipment, Mod assist         General transfer comment: mod A x 2 with RW in lowest position with pt instructed to push though B UE, pt able to demonstrate improved abiltiy to lift and place LE to advace for pivoting task and take retrograde steps to EOB, pt required standing therapeutic rest break due to B UE fatigue, cues for encouragement and A for RW management    Ambulation/Gait               General Gait Details: NT due to extensive assist for transer tasks, reported L LE pain and UE fatigue with pt demonstrating excessive trunk flexion prior to being assisted to recliner   Stairs             Wheelchair Mobility     Tilt Bed    Modified Rankin (Stroke Patients Only)       Balance Overall balance assessment: History of Falls, Needs assistance Sitting-balance support: Feet supported Sitting balance-Leahy Scale: Good     Standing balance support: Bilateral upper extremity supported, During functional activity, Reliant on assistive device for balance Standing balance-Leahy Scale: Zero  Communication Communication Communication: Impaired Factors Affecting Communication: Hearing impaired  Cognition Arousal: Alert Behavior During Therapy: WFL for tasks assessed/performed (slight confustion and attributed to general anesthesia and medications)   PT - Cognitive impairments: Attention, Memory                         Following commands: Impaired Following commands impaired: Follows multi-step commands inconsistently    Cueing    Exercises      General Comments        Pertinent Vitals/Pain Pain Assessment Pain  Assessment: Faces Faces Pain Scale: Hurts little more Pain Location: L knee and LE Pain Descriptors / Indicators: Aching, Discomfort, Dull, Operative site guarding Pain Intervention(s): Limited activity within patient's tolerance, Monitored during session, Repositioned, Ice applied    Home Living Family/patient expects to be discharged to:: Private residence Living Arrangements: Alone Available Help at Discharge: Family;Personal care attendant (9 a- 3 p x 4 d wk) Type of Home: House Home Access: Ramped entrance       Home Layout: One level Home Equipment: Rollator (4 wheels);Rolling Walker (2 wheels);Cane - single point (walking stick) Additional Comments: has some old equipment from spouse whom passed secondary to ALS including hospital bed and motorized wc (no longer functional)    Prior Function            PT Goals (current goals can now be found in the care plan section) Acute Rehab PT Goals Patient Stated Goal: to be able to move better at home PT Goal Formulation: With patient Time For Goal Achievement: 03/23/24 Potential to Achieve Goals: Good Progress towards PT goals: Progressing toward goals    Frequency    7X/week      PT Plan      Co-evaluation              AM-PAC PT 6 Clicks Mobility   Outcome Measure  Help needed turning from your back to your side while in a flat bed without using bedrails?: A Little Help needed moving from lying on your back to sitting on the side of a flat bed without using bedrails?: A Lot Help needed moving to and from a bed to a chair (including a wheelchair)?: A Lot Help needed standing up from a chair using your arms (e.g., wheelchair or bedside chair)?: A Lot Help needed to walk in hospital room?: Total Help needed climbing 3-5 steps with a railing? : Total 6 Click Score: 11    End of Session Equipment Utilized During Treatment: Gait belt Activity Tolerance: Patient limited by pain;Patient limited by  fatigue Patient left: with call bell/phone within reach;in bed;with bed alarm set Nurse Communication: Mobility status PT Visit Diagnosis: Unsteadiness on feet (R26.81);Other abnormalities of gait and mobility (R26.89);Muscle weakness (generalized) (M62.81);Difficulty in walking, not elsewhere classified (R26.2);Pain Pain - Right/Left: Left Pain - part of body: Leg;Knee     Time: 8170-8145 PT Time Calculation (min) (ACUTE ONLY): 25 min  Charges:    $Therapeutic Activity: 23-37 mins PT General Charges $$ ACUTE PT VISIT: 1 Visit                     [place], PT Acute Rehab    [place] VEAR Drone 03/09/2024, 7:15 PM

## 2024-03-09 NOTE — Interval H&P Note (Signed)
 History and Physical Interval Note:  03/09/2024 9:48 AM  [name]  has presented today for surgery, with the diagnosis of left knee osteoarthritis.  The various methods of treatment have been discussed with the patient and family. After consideration of risks, benefits and other options for treatment, the patient has consented to  Procedure(s): ARTHROPLASTY, KNEE, TOTAL (Left) as a surgical intervention.  The patient's history has been reviewed, patient examined, no change in status, stable for surgery.  I have reviewed the patient's chart and labs.  Questions were answered to the patient's satisfaction.     [name]

## 2024-03-09 NOTE — Anesthesia Postprocedure Evaluation (Signed)
 Anesthesia Post Note  Patient: [facility]  Procedure(s) Performed: ARTHROPLASTY, KNEE, TOTAL (Left: Knee)     Patient location during evaluation: PACU Anesthesia Type: General and Regional Level of consciousness: awake and alert, oriented and patient cooperative Pain management: pain level controlled Vital Signs Assessment: post-procedure vital signs reviewed and stable Respiratory status: spontaneous breathing, nonlabored ventilation and respiratory function stable Cardiovascular status: blood pressure returned to baseline and stable Postop Assessment: no apparent nausea or vomiting Anesthetic complications: no   No notable events documented.  Last Vitals:  Vitals:   03/09/24 1318 03/09/24 1330  BP:  (!) 166/88  Pulse: 69   Resp: 19 16  Temp:  36.7 C  SpO2: 96% 96%    Last Pain:  Vitals:   03/09/24 1330  TempSrc:   PainSc: 0-No pain                 Almarie CHRISTELLA Marchi

## 2024-03-09 NOTE — Anesthesia Procedure Notes (Signed)
 Anesthesia Regional Block: Adductor canal block   Pre-Anesthetic Checklist: , timeout performed,  Correct Patient, Correct Site, Correct Laterality,  Correct Procedure, Correct Position, site marked,  Risks and benefits discussed,  Surgical consent,  Pre-op evaluation,  At surgeon's request and post-op pain management  Laterality: Left  Prep: Maximum Sterile Barrier Precautions used, chloraprep       Needles:  Injection technique: Single-shot  Needle Type: Echogenic Stimulator Needle     Needle Length: 9cm  Needle Gauge: 22     Additional Needles:   Procedures:,,,, ultrasound used (permanent image in chart),,    Narrative:  Start time: 03/09/2024 9:50 AM End time: 03/09/2024 9:55 AM Injection made incrementally with aspirations every 5 mL.  Performed by: Personally  Anesthesiologist: Merla Almarie HERO, DO  Additional Notes: Monitors applied. No increased pain on injection. No increased resistance to injection. Injection made in 5cc increments. Good needle visualization. Patient tolerated procedure well.

## 2024-03-09 NOTE — Care Plan (Signed)
 Ortho Bundle Case Management Note  Patient Details  Name: [name] MRN: [id number] Date of Birth: Dec 13, 1945  R TKA on 03/09/24.   DCP: Home with daughter  DME: RW ordered through [employer]  PT: EO                   DME Arranged:  Walker rolling DME Agency:  [employer]  HH Arranged:    HH Agency:     Additional Comments: Please contact me with any questions of if this plan should need to change.  Lyle Pepper, [place] [facility] [phone]   03/09/2024, 11:06 AM

## 2024-03-09 NOTE — Anesthesia Procedure Notes (Signed)
 Procedure Name: Intubation Date/Time: 03/09/2024 10:59 AM  Performed by: Belvie Valri NOVAK, CRNAPre-anesthesia Checklist: Patient identified, Emergency Drugs available, Suction available and Patient being monitored Patient Re-evaluated:Patient Re-evaluated prior to induction Oxygen Delivery Method: Circle System Utilized Preoxygenation: Pre-oxygenation with 100% oxygen Induction Type: IV induction Ventilation: Mask ventilation without difficulty Laryngoscope Size: Mac and 3 Grade View: Grade I Tube type: Oral Tube size: 7.0 mm Number of attempts: 1 Airway Equipment and Method: Stylet and Oral airway Placement Confirmation: ETT inserted through vocal cords under direct vision, positive ETCO2 and breath sounds checked- equal and bilateral Secured at: 22 cm Tube secured with: Tape Dental Injury: Teeth and Oropharynx as per pre-operative assessment

## 2024-03-09 NOTE — Discharge Instructions (Signed)

## 2024-03-10 ENCOUNTER — Encounter (HOSPITAL_COMMUNITY): Payer: Self-pay | Admitting: Orthopedic Surgery

## 2024-03-10 DIAGNOSIS — F32A Depression, unspecified: Secondary | ICD-10-CM | POA: Diagnosis present

## 2024-03-10 DIAGNOSIS — G3184 Mild cognitive impairment, so stated: Secondary | ICD-10-CM | POA: Diagnosis present

## 2024-03-10 DIAGNOSIS — I2510 Atherosclerotic heart disease of native coronary artery without angina pectoris: Secondary | ICD-10-CM | POA: Diagnosis present

## 2024-03-10 DIAGNOSIS — M25762 Osteophyte, left knee: Secondary | ICD-10-CM | POA: Diagnosis present

## 2024-03-10 DIAGNOSIS — Z7951 Long term (current) use of inhaled steroids: Secondary | ICD-10-CM | POA: Diagnosis not present

## 2024-03-10 DIAGNOSIS — F411 Generalized anxiety disorder: Secondary | ICD-10-CM | POA: Diagnosis present

## 2024-03-10 DIAGNOSIS — J309 Allergic rhinitis, unspecified: Secondary | ICD-10-CM | POA: Diagnosis present

## 2024-03-10 DIAGNOSIS — Z96611 Presence of right artificial shoulder joint: Secondary | ICD-10-CM | POA: Diagnosis present

## 2024-03-10 DIAGNOSIS — E785 Hyperlipidemia, unspecified: Secondary | ICD-10-CM | POA: Diagnosis present

## 2024-03-10 DIAGNOSIS — Z79890 Hormone replacement therapy (postmenopausal): Secondary | ICD-10-CM | POA: Diagnosis not present

## 2024-03-10 DIAGNOSIS — H9193 Unspecified hearing loss, bilateral: Secondary | ICD-10-CM | POA: Diagnosis present

## 2024-03-10 DIAGNOSIS — M1712 Unilateral primary osteoarthritis, left knee: Secondary | ICD-10-CM | POA: Diagnosis present

## 2024-03-10 DIAGNOSIS — Z87891 Personal history of nicotine dependence: Secondary | ICD-10-CM | POA: Diagnosis not present

## 2024-03-10 DIAGNOSIS — Z885 Allergy status to narcotic agent status: Secondary | ICD-10-CM | POA: Diagnosis not present

## 2024-03-10 DIAGNOSIS — R32 Unspecified urinary incontinence: Secondary | ICD-10-CM | POA: Diagnosis not present

## 2024-03-10 DIAGNOSIS — Z96641 Presence of right artificial hip joint: Secondary | ICD-10-CM | POA: Diagnosis present

## 2024-03-10 DIAGNOSIS — Z79899 Other long term (current) drug therapy: Secondary | ICD-10-CM | POA: Diagnosis not present

## 2024-03-10 DIAGNOSIS — Z981 Arthrodesis status: Secondary | ICD-10-CM | POA: Diagnosis not present

## 2024-03-10 DIAGNOSIS — Z7982 Long term (current) use of aspirin: Secondary | ICD-10-CM | POA: Diagnosis not present

## 2024-03-10 DIAGNOSIS — E039 Hypothyroidism, unspecified: Secondary | ICD-10-CM | POA: Diagnosis present

## 2024-03-10 DIAGNOSIS — Z9049 Acquired absence of other specified parts of digestive tract: Secondary | ICD-10-CM | POA: Diagnosis not present

## 2024-03-10 DIAGNOSIS — Z880 Allergy status to penicillin: Secondary | ICD-10-CM | POA: Diagnosis not present

## 2024-03-10 DIAGNOSIS — K219 Gastro-esophageal reflux disease without esophagitis: Secondary | ICD-10-CM | POA: Diagnosis present

## 2024-03-10 DIAGNOSIS — Z8249 Family history of ischemic heart disease and other diseases of the circulatory system: Secondary | ICD-10-CM | POA: Diagnosis not present

## 2024-03-10 LAB — BASIC METABOLIC PANEL WITH GFR
Anion gap: 14 (ref 5–15)
BUN: 26 mg/dL — ABNORMAL HIGH (ref 8–23)
CO2: 23 mmol/L (ref 22–32)
Calcium: 9.6 mg/dL (ref 8.9–10.3)
Chloride: 105 mmol/L (ref 98–111)
Creatinine, Ser: 1.09 mg/dL — ABNORMAL HIGH (ref 0.44–1.00)
GFR, Estimated: 52 mL/min — ABNORMAL LOW (ref >60)
Glucose, Bld: 137 mg/dL — ABNORMAL HIGH (ref 70–99)
Potassium: 4.5 mmol/L (ref 3.5–5.1)
Sodium: 141 mmol/L (ref 135–145)

## 2024-03-10 LAB — CBC
HCT: 31.1 % — ABNORMAL LOW (ref 36.0–46.0)
Hemoglobin: 9.6 g/dL — ABNORMAL LOW (ref 12.0–15.0)
MCH: 31 pg (ref 26.0–34.0)
MCHC: 30.9 g/dL (ref 30.0–36.0)
MCV: 100.3 fL — ABNORMAL HIGH (ref 80.0–100.0)
Platelets: 210 K/uL (ref 150–400)
RBC: 3.1 MIL/uL — ABNORMAL LOW (ref 3.87–5.11)
RDW: 13.2 % (ref 11.5–15.5)
WBC: 13.5 K/uL — ABNORMAL HIGH (ref 4.0–10.5)
nRBC: 0 % (ref 0.0–0.2)

## 2024-03-10 MED ORDER — CELECOXIB 200 MG PO CAPS
200.0000 mg | ORAL_CAPSULE | Freq: Two times a day (BID) | ORAL | Status: DC
  Administered 2024-03-10 – 2024-03-12 (×5): 200 mg via ORAL
  Filled 2024-03-10 (×5): qty 1

## 2024-03-10 NOTE — Progress Notes (Addendum)
 Physical Therapy Treatment Patient Details Name: [name] MRN: [id number] DOB: [birth year] Today's Date: 03/10/2024   History of Present Illness 78 yo female presents to therapy s/p L TKA on 03/09/2024 due to failure of conservative measures. Pt PMH includes but is not limited to: OP, carpal tunnel syndrome, R THA, AA (2020), HOH, chronic pain, DDD lumbosacral region s/p surgery, GERD, anxiety, asthma, IBS, HTN, hypothyroidism, falls, L hip fx and surgery (2015), and R reverse TSA.    PT Comments  Pt is progressing with mobility, she ambulated 60' with RW, no loss of balance, distance limited by pain/fatigue. Initiated TKA HEP, pt tolerated well. Pt's private caregiver Sharlet present during session.    If plan is discharge home, recommend the following: Assistance with cooking/housework;Assist for transportation;A little help with walking and/or transfers;A little help with bathing/dressing/bathroom   Can travel by private vehicle        Equipment Recommendations  None recommended by PT    Recommendations for Other Services       Precautions / Restrictions Precautions Precautions: Fall;Knee Precaution Booklet Issued: Yes (comment) Recall of Precautions/Restrictions: Intact Precaution/Restrictions Comments: reviewed no pillow under knee Restrictions Weight Bearing Restrictions Per Provider Order: No     Mobility  Bed Mobility Overal bed mobility: Modified Independent Bed Mobility: Supine to Sit     Supine to sit: Modified independent (Device/Increase time), HOB elevated, Used rails     General bed mobility comments: increased time and cues to scoot toward EOB    Transfers Overall transfer level: Needs assistance Equipment used: Rolling walker (2 wheels) Transfers: Sit to/from Stand Sit to Stand: Min assist Stand pivot transfers: Contact guard assist         General transfer comment: VCs hand placement, min steadying assist for STS     Ambulation/Gait Ambulation/Gait assistance: Contact guard assist Gait Distance (Feet): 18 Feet Assistive device: Rolling walker (2 wheels) Gait Pattern/deviations: Step-to pattern, Decreased step length - right, Decreased step length - left Gait velocity: decr     General Gait Details: VCs sequencing and to lift head, no loss of balance, distance limited by pain/fatigue   Stairs             Wheelchair Mobility     Tilt Bed    Modified Rankin (Stroke Patients Only)       Balance Overall balance assessment: History of Falls, Needs assistance Sitting-balance support: Feet supported Sitting balance-Leahy Scale: Good     Standing balance support: Bilateral upper extremity supported, During functional activity, Reliant on assistive device for balance Standing balance-Leahy Scale: Poor                              Communication Communication Communication: Impaired Factors Affecting Communication: Hearing impaired  Cognition Arousal: Alert Behavior During Therapy: WFL for tasks assessed/performed   PT - Cognitive impairments: No apparent impairments                         Following commands: Intact      Cueing Cueing Techniques: Verbal cues  Exercises Total Joint Exercises Ankle Circles/Pumps: AROM, Both, 10 reps, Supine Quad Sets: AROM, Both, 5 reps, Supine Short Arc Quad: AROM, Left, 5 reps, Supine Heel Slides: AAROM, Left, 5 reps, Supine    General Comments        Pertinent Vitals/Pain Pain Assessment Pain Assessment: 0-10 Pain Score: 4  Pain Location: L knee and LE  Pain Descriptors / Indicators: Aching, Discomfort, Dull, Operative site guarding Pain Intervention(s): Limited activity within patient's tolerance, Monitored during session, Premedicated before session, Ice applied    Home Living                          Prior Function            PT Goals (current goals can now be found in the care plan  section) Acute Rehab PT Goals Patient Stated Goal: to be able to move better at home PT Goal Formulation: With patient Time For Goal Achievement: 03/23/24 Potential to Achieve Goals: Good Progress towards PT goals: Progressing toward goals    Frequency    7X/week      PT Plan      Co-evaluation              AM-PAC PT 6 Clicks Mobility   Outcome Measure  Help needed turning from your back to your side while in a flat bed without using bedrails?: A Little Help needed moving from lying on your back to sitting on the side of a flat bed without using bedrails?: A Little Help needed moving to and from a bed to a chair (including a wheelchair)?: A Little Help needed standing up from a chair using your arms (e.g., wheelchair or bedside chair)?: A Little Help needed to walk in hospital room?: A Little Help needed climbing 3-5 steps with a railing? : A Lot 6 Click Score: 17    End of Session Equipment Utilized During Treatment: Gait belt Activity Tolerance: Patient limited by pain;Patient limited by fatigue Patient left: with call bell/phone within reach;in chair;with chair alarm set;with family/visitor present Nurse Communication: Mobility status PT Visit Diagnosis: Unsteadiness on feet (R26.81);Other abnormalities of gait and mobility (R26.89);Muscle weakness (generalized) (M62.81);Difficulty in walking, not elsewhere classified (R26.2);Pain Pain - Right/Left: Left Pain - part of body: Knee     Time: 8886-8856 PT Time Calculation (min) (ACUTE ONLY): 30 min  Charges:    $Gait Training: 8-22 mins $Therapeutic Exercise: 8-22 mins PT General Charges $$ ACUTE PT VISIT: 1 Visit                     Sylvan Nest Kistler PT 03/10/2024  Acute Rehabilitation Services  Office [phone]

## 2024-03-10 NOTE — Progress Notes (Signed)
 Subjective: 1 Day Post-Op Procedure(s) (LRB): ARTHROPLASTY, KNEE, TOTAL (Left) Patient reports pain as mild.   Patient seen in rounds for Dr. Ernie. Patient is resting in bed on exam this morning with daughter at the bedside. She had a very miserable night - mostly related to many episodes of urinary incontinence and frustration related to this. We discussed this. She has minimal pain in the knee, but some difficulty moving the leg.  We will start therapy today.   Objective: Vital signs in last 24 hours: Temp:  [97.7 F (36.5 C)-98.1 F (36.7 C)] 98.1 F (36.7 C) (09/17 0555) Pulse Rate:  [62-77] 71 (09/17 0555) Resp:  [14-19] 16 (09/16 1754) BP: (120-194)/(66-90) 126/66 (09/17 0555) SpO2:  [94 %-100 %] 96 % (09/17 0555)  Intake/Output from previous day:  Intake/Output Summary (Last 24 hours) at 03/10/2024 0831 Last data filed at 03/10/2024 0618 Gross per 24 hour  Intake 3075.55 ml  Output 725 ml  Net 2350.55 ml     Intake/Output this shift: No intake/output data recorded.  Labs: Recent Labs    03/10/24 0316  HGB 9.6*   Recent Labs    03/10/24 0316  WBC 13.5*  RBC 3.10*  HCT 31.1*  PLT 210   Recent Labs    03/10/24 0316  NA 141  K 4.5  CL 105  CO2 23  BUN 26*  CREATININE 1.09*  GLUCOSE 137*  CALCIUM  9.6   No results for input(s): LABPT, INR in the last 72 hours.  Exam: General - Patient is Alert and Appropriate, likely some chronic memory changes  Extremity - Neurologically intact Sensation intact distally Intact pulses distally Dorsiflexion/Plantar flexion intact Dressing - dressing C/D/I Motor Function - intact, moving foot and toes well on exam.   Past Medical History:  Diagnosis Date   ADD (attention deficit disorder)    Allergy    Anxiety    Asthma    Attention deficit disorder without mention of hyperactivity    Back pain    Colitis 12/05/2010   Colon polyps    Complication of anesthesia    problem with grogginess and a long  time to get over Anesthesia   Depression    Diverticulosis    GERD (gastroesophageal reflux disease)    H/O hiatal hernia    Heart murmur    mild to moderate AS per ECHO 05-2023   Hypertension    Hypothyroidism    IBS (irritable bowel syndrome)    MCI (mild cognitive impairment)    Memory changes    Osteoarthrosis, unspecified whether generalized or localized, unspecified site    RA   Other and unspecified hyperlipidemia    Renal cyst 10/2015   simple left   Repeated falls    Thyroid  disease    Ulcerative colitis    Unspecified asthma(493.90)    Unspecified essential hypertension    taken off meds since lost wt    Assessment/Plan: 1 Day Post-Op Procedure(s) (LRB): ARTHROPLASTY, KNEE, TOTAL (Left) Principal Problem:   S/P total knee arthroplasty, left  Estimated body mass index is 26.63 kg/m as calculated from the following:   Height as of this encounter: 5' 5 (1.651 m).   Weight as of 03/03/24: 72.6 kg. Advance diet Up with therapy  DVT Prophylaxis - Aspirin  Weight bearing as tolerated.  Hgb stable at 9.6 this AM  Patient had a very miserable night related to urinary incontinence We discussed reasoning for not ordering a foley with general anesthesia, but obviously did not anticipate  this level of issues   Will order purewick to use in bed today and tonight Otherwise hopefully will progress to getting up to commode today  Will require another night stay for safe mobility  She does likely have memory changes at baseline Her daughter is very helpful, but cannot manage her at home at this level today   Rosina Calin, PA-C Orthopedic Surgery [phone] 03/10/2024, 8:31 AM

## 2024-03-10 NOTE — Progress Notes (Signed)
 Physical Therapy Treatment Patient Details Name: [name] MRN: [id number] DOB: 26-Jul-1945 Today's Date: 03/10/2024   History of Present Illness 78 yo female presents to therapy s/p L TKA on 03/09/2024 due to failure of conservative measures. Pt PMH includes but is not limited to: OP, carpal tunnel syndrome, R THA, AA (2020), HOH, chronic pain, DDD lumbosacral region s/p surgery, GERD, anxiety, asthma, IBS, HTN, hypothyroidism, falls, L hip fx and surgery (2015), and R reverse TSA.    PT Comments  Pt tolerated increased ambulation distance of 28' with RW, distance limited by pain and fatigue. L TKA exercises performed with verbal cues for technique. Pt reports pain as minimal.     If plan is discharge home, recommend the following: Assistance with cooking/housework;Assist for transportation;A little help with walking and/or transfers;A little help with bathing/dressing/bathroom   Can travel by private vehicle        Equipment Recommendations  None recommended by PT    Recommendations for Other Services       Precautions / Restrictions Precautions Precautions: Fall;Knee Precaution Booklet Issued: Yes (comment) Recall of Precautions/Restrictions: Intact Precaution/Restrictions Comments: reviewed no pillow under knee Restrictions Weight Bearing Restrictions Per Provider Order: No     Mobility  Bed Mobility Overal bed mobility: Needs Assistance Bed Mobility: Sit to Supine     Supine to sit: Modified independent (Device/Increase time), HOB elevated, Used rails Sit to supine: Min assist   General bed mobility comments: assist for LLE into bed    Transfers Overall transfer level: Needs assistance Equipment used: Rolling walker (2 wheels) Transfers: Sit to/from Stand Sit to Stand: Min assist Stand pivot transfers: Contact guard assist         General transfer comment: VCs hand placement, min A to power up    Ambulation/Gait Ambulation/Gait assistance:  Contact guard assist Gait Distance (Feet): 28 Feet Assistive device: Rolling walker (2 wheels) Gait Pattern/deviations: Step-to pattern, Decreased step length - right, Decreased step length - left Gait velocity: decr     General Gait Details: VCs sequencing and to lift head, no loss of balance, distance limited by pain/fatigue   Stairs             Wheelchair Mobility     Tilt Bed    Modified Rankin (Stroke Patients Only)       Balance Overall balance assessment: History of Falls, Needs assistance Sitting-balance support: Feet supported Sitting balance-Leahy Scale: Good     Standing balance support: Bilateral upper extremity supported, During functional activity, Reliant on assistive device for balance Standing balance-Leahy Scale: Poor                              Communication Communication Communication: Impaired Factors Affecting Communication: Hearing impaired  Cognition Arousal: Alert Behavior During Therapy: WFL for tasks assessed/performed   PT - Cognitive impairments: No apparent impairments                         Following commands: Intact      Cueing Cueing Techniques: Verbal cues  Exercises Total Joint Exercises Ankle Circles/Pumps: AROM, Both, 10 reps, Supine Heel Slides: AAROM, Left, Supine, 10 reps Long Arc Quad: AROM, Right, 10 reps Knee Flexion: AAROM, Right, Seated, 10 reps    General Comments        Pertinent Vitals/Pain Pain Assessment Pain Assessment: 0-10 Pain Score: 4  Pain Location: L knee and LE Pain Descriptors /  Indicators: Aching, Discomfort, Operative site guarding Pain Intervention(s): Limited activity within patient's tolerance, Monitored during session, Patient requesting pain meds-RN notified, Ice applied, Repositioned    Home Living                          Prior Function            PT Goals (current goals can now be found in the care plan section) Acute Rehab PT  Goals Patient Stated Goal: to be able to move better at home PT Goal Formulation: With patient Time For Goal Achievement: 03/23/24 Potential to Achieve Goals: Good Progress towards PT goals: Progressing toward goals    Frequency    7X/week      PT Plan      Co-evaluation              AM-PAC PT 6 Clicks Mobility   Outcome Measure  Help needed turning from your back to your side while in a flat bed without using bedrails?: A Little Help needed moving from lying on your back to sitting on the side of a flat bed without using bedrails?: A Little Help needed moving to and from a bed to a chair (including a wheelchair)?: A Little Help needed standing up from a chair using your arms (e.g., wheelchair or bedside chair)?: A Little Help needed to walk in hospital room?: A Little Help needed climbing 3-5 steps with a railing? : A Lot 6 Click Score: 17    End of Session Equipment Utilized During Treatment: Gait belt Activity Tolerance: Patient limited by fatigue Patient left: with call bell/phone within reach;with family/visitor present;in bed;with bed alarm set Nurse Communication: Mobility status PT Visit Diagnosis: Unsteadiness on feet (R26.81);Other abnormalities of gait and mobility (R26.89);Muscle weakness (generalized) (M62.81);Difficulty in walking, not elsewhere classified (R26.2);Pain Pain - Right/Left: Left Pain - part of body: Knee     Time: 8743-8673 PT Time Calculation (min) (ACUTE ONLY): 30 min  Charges:    $Gait Training: 8-22 mins $Therapeutic Exercise: 8-22 mins PT General Charges $$ ACUTE PT VISIT: 1 Visit                     Sylvan Nest Kistler PT 03/10/2024  Acute Rehabilitation Services  Office [phone]

## 2024-03-10 NOTE — TOC Transition Note (Signed)
 Transition of Care [facility]) - Discharge Note   Patient Details  Name: [name] MRN: [id number] Date of Birth: 19-Oct-1945  [facility] CM/SW Contact:  NORMAN ASPEN, LCSW Phone Number: 03/10/2024, 9:21 AM   Clinical Narrative:     Met with pt and confirming she has received RW to room via [employer].  OPPT already arranged with [facility].  No further TOC needs.  Final next level of care: OP Rehab Barriers to Discharge: No Barriers Identified   Patient Goals and CMS Choice Patient states their goals for this hospitalization and ongoing recovery are:: return home          Discharge Placement                       Discharge Plan and Services Additional resources added to the After Visit Summary for                  DME Arranged: Walker rolling DME Agency: [employer]                  Social Drivers of Health (SDOH) Interventions SDOH Screenings   Food Insecurity: No Food Insecurity (03/09/2024)  Housing: Low Risk  (03/09/2024)  Transportation Needs: No Transportation Needs (03/09/2024)  Utilities: Not At Risk (03/09/2024)  Financial Resource Strain: Low Risk  (07/01/2017)  Social Connections: Moderately Integrated (03/09/2024)  Tobacco Use: Medium Risk (03/09/2024)     Readmission Risk Interventions     No data to display

## 2024-03-11 LAB — CBC
HCT: 26.9 % — ABNORMAL LOW (ref 36.0–46.0)
Hemoglobin: 8.3 g/dL — ABNORMAL LOW (ref 12.0–15.0)
MCH: 31.1 pg (ref 26.0–34.0)
MCHC: 30.9 g/dL (ref 30.0–36.0)
MCV: 100.7 fL — ABNORMAL HIGH (ref 80.0–100.0)
Platelets: 181 K/uL (ref 150–400)
RBC: 2.67 MIL/uL — ABNORMAL LOW (ref 3.87–5.11)
RDW: 13.7 % (ref 11.5–15.5)
WBC: 12.2 K/uL — ABNORMAL HIGH (ref 4.0–10.5)
nRBC: 0 % (ref 0.0–0.2)

## 2024-03-11 NOTE — Progress Notes (Signed)
 Physical Therapy Treatment Patient Details Name: [name] MRN: [id number] DOB: Feb 03, 1946 Today's Date: 03/11/2024   History of Present Illness 78 yo female presents to therapy s/p L TKA on 03/09/2024 due to failure of conservative measures. Pt PMH includes but is not limited to: OP, carpal tunnel syndrome, R THA, AA (2020), HOH, chronic pain, DDD lumbosacral region s/p surgery, GERD, anxiety, asthma, IBS, HTN, hypothyroidism, falls, L hip fx and surgery (2015), and R reverse TSA.    PT Comments  Pt ambulated 81' with RW, distance limited by fatigue (esp BUE fatigue with use of RW). Pt performed TKA HEP with verbal/manual cues. Pt requires ongoing cues for safety as she attempted to stand prior to having the walker in front of her, and needed cues for safe proximity to RW. Pt's private CG Sharlet present during session.   If plan is discharge home, recommend the following: Assistance with cooking/housework;Assist for transportation;A little help with walking and/or transfers;A little help with bathing/dressing/bathroom   Can travel by private vehicle        Equipment Recommendations  None recommended by PT    Recommendations for Other Services       Precautions / Restrictions Precautions Precautions: Fall;Knee Precaution Booklet Issued: Yes (comment) Recall of Precautions/Restrictions: Intact Precaution/Restrictions Comments: reviewed no pillow under knee Restrictions Weight Bearing Restrictions Per Provider Order: No     Mobility  Bed Mobility Overal bed mobility: Needs Assistance Bed Mobility: Supine to Sit     Supine to sit: Supervision, HOB elevated, Used rails          Transfers Overall transfer level: Needs assistance Equipment used: Rolling walker (2 wheels) Transfers: Sit to/from Stand Sit to Stand: Min assist           General transfer comment: VCs hand placement, min A to power up and to steady    Ambulation/Gait Ambulation/Gait  assistance: Contact guard assist Gait Distance (Feet): 44 Feet Assistive device: Rolling walker (2 wheels) Gait Pattern/deviations: Step-to pattern, Decreased step length - right, Decreased step length - left Gait velocity: decr     General Gait Details: VCs sequencing and to lift head, no loss of balance, distance limited by pain/fatigue   Stairs             Wheelchair Mobility     Tilt Bed    Modified Rankin (Stroke Patients Only)       Balance Overall balance assessment: History of Falls, Needs assistance Sitting-balance support: Feet supported Sitting balance-Leahy Scale: Good     Standing balance support: Bilateral upper extremity supported, During functional activity, Reliant on assistive device for balance Standing balance-Leahy Scale: Poor                              Communication Communication Communication: Impaired Factors Affecting Communication: Hearing impaired  Cognition Arousal: Alert Behavior During Therapy: WFL for tasks assessed/performed   PT - Cognitive impairments: Memory                       PT - Cognition Comments: decreased recall of instructions Following commands: Impaired Following commands impaired: Follows multi-step commands inconsistently    Cueing Cueing Techniques: Verbal cues  Exercises Total Joint Exercises Ankle Circles/Pumps: AROM, Both, 10 reps, Supine Quad Sets: AROM, Both, 5 reps, Supine Short Arc Quad: AROM, Left, Supine, 10 reps Heel Slides: AAROM, Left, Supine, 10 reps Hip ABduction/ADduction: AROM, Left, 10 reps, Supine  General Comments        Pertinent Vitals/Pain      Home Living                          Prior Function            PT Goals (current goals can now be found in the care plan section) Acute Rehab PT Goals Patient Stated Goal: to be able to move better at home PT Goal Formulation: With patient Time For Goal Achievement: 03/23/24 Potential to  Achieve Goals: Good Progress towards PT goals: Progressing toward goals    Frequency    7X/week      PT Plan      Co-evaluation              AM-PAC PT 6 Clicks Mobility   Outcome Measure  Help needed turning from your back to your side while in a flat bed without using bedrails?: A Little Help needed moving from lying on your back to sitting on the side of a flat bed without using bedrails?: A Little Help needed moving to and from a bed to a chair (including a wheelchair)?: A Little Help needed standing up from a chair using your arms (e.g., wheelchair or bedside chair)?: A Little Help needed to walk in hospital room?: A Little Help needed climbing 3-5 steps with a railing? : A Lot 6 Click Score: 17    End of Session Equipment Utilized During Treatment: Gait belt Activity Tolerance: Patient limited by fatigue;Patient limited by pain Patient left: with call bell/phone within reach;with family/visitor present;in chair;with chair alarm set Nurse Communication: Mobility status PT Visit Diagnosis: Unsteadiness on feet (R26.81);Other abnormalities of gait and mobility (R26.89);Muscle weakness (generalized) (M62.81);Difficulty in walking, not elsewhere classified (R26.2);Pain Pain - Right/Left: Left Pain - part of body: Knee     Time: 8990-8961 PT Time Calculation (min) (ACUTE ONLY): 29 min  Charges:    $Gait Training: 8-22 mins $Therapeutic Exercise: 8-22 mins PT General Charges $$ ACUTE PT VISIT: 1 Visit                    Sylvan Nest Kistler PT 03/11/2024  Acute Rehabilitation Services  Office [phone]

## 2024-03-11 NOTE — Plan of Care (Signed)
  Problem: Pain Management: Goal: Pain level will decrease with appropriate interventions Outcome: Progressing   Problem: Activity: Goal: Range of joint motion will improve Outcome: Progressing   Problem: Education: Goal: Knowledge of the prescribed therapeutic regimen will improve Outcome: Progressing   Problem: Safety: Goal: Ability to remain free from injury will improve Outcome: Progressing

## 2024-03-11 NOTE — Progress Notes (Signed)
 Physical Therapy Treatment Patient Details Name: [name] MRN: [id number] DOB: 1946/03/17 Today's Date: 03/11/2024   History of Present Illness 78 yo female presents to therapy s/p L TKA on 03/09/2024 due to failure of conservative measures. Pt PMH includes but is not limited to: OP, carpal tunnel syndrome, R THA, AA (2020), HOH, chronic pain, DDD lumbosacral region s/p surgery, GERD, anxiety, asthma, IBS, HTN, hypothyroidism, falls, L hip fx and surgery (2015), and R reverse TSA.    PT Comments  Pt is progressing with mobility, she ambulated 49' with RW, no loss of balance. Reviewed TKA HEP, pt tolerated exercises well. Activity tolerance limited by 8/10 pain, pt had pain medication prior to PT session.     If plan is discharge home, recommend the following: Assistance with cooking/housework;Assist for transportation;A little help with walking and/or transfers;A little help with bathing/dressing/bathroom   Can travel by private vehicle        Equipment Recommendations  None recommended by PT    Recommendations for Other Services       Precautions / Restrictions Precautions Precautions: Fall;Knee Precaution Booklet Issued: Yes (comment) Recall of Precautions/Restrictions: Intact Precaution/Restrictions Comments: reviewed no pillow under knee Restrictions Weight Bearing Restrictions Per Provider Order: No     Mobility  Bed Mobility Overal bed mobility: Needs Assistance Bed Mobility: Supine to Sit     Supine to sit: HOB elevated, Used rails, Min assist     General bed mobility comments: min A to pivot hips to edge of bed    Transfers Overall transfer level: Needs assistance Equipment used: Rolling walker (2 wheels) Transfers: Sit to/from Stand Sit to Stand: Min assist           General transfer comment: VCs hand placement, min A to power up and to steady    Ambulation/Gait Ambulation/Gait assistance: Contact guard assist Gait Distance (Feet):  70 Feet Assistive device: Rolling walker (2 wheels) Gait Pattern/deviations: Step-to pattern, Decreased step length - right, Decreased step length - left Gait velocity: decr     General Gait Details: VCs sequencing and to lift head, no loss of balance, distance limited by pain/fatigue   Stairs             Wheelchair Mobility     Tilt Bed    Modified Rankin (Stroke Patients Only)       Balance Overall balance assessment: History of Falls, Needs assistance Sitting-balance support: Feet supported Sitting balance-Leahy Scale: Good     Standing balance support: Bilateral upper extremity supported, During functional activity, Reliant on assistive device for balance Standing balance-Leahy Scale: Poor                              Communication Communication Communication: Impaired Factors Affecting Communication: Hearing impaired  Cognition Arousal: Alert Behavior During Therapy: WFL for tasks assessed/performed   PT - Cognitive impairments: Memory                       PT - Cognition Comments: decreased recall of instructions Following commands: Impaired Following commands impaired: Follows multi-step commands inconsistently    Cueing Cueing Techniques: Verbal cues  Exercises Total Joint Exercises Ankle Circles/Pumps: AROM, Both, 10 reps, Supine Quad Sets: AROM, Both, 5 reps, Supine Short Arc Quad: AROM, Left, Supine, 10 reps Heel Slides: AAROM, Left, Supine, 10 reps Hip ABduction/ADduction: AROM, Left, 10 reps, Supine Long Arc Quad: AROM, Left Knee Flexion: AAROM, Seated, 10  reps, Left Goniometric ROM: 5-80* AAROM L knee    General Comments        Pertinent Vitals/Pain Pain Assessment Pain Score: 8  Pain Location: L knee Pain Descriptors / Indicators: Aching, Discomfort, Operative site guarding Pain Intervention(s): Limited activity within patient's tolerance, Monitored during session, Ice applied, Repositioned, Premedicated  before session    Home Living                          Prior Function            PT Goals (current goals can now be found in the care plan section) Acute Rehab PT Goals Patient Stated Goal: to be able to move better at home PT Goal Formulation: With patient Time For Goal Achievement: 03/23/24 Potential to Achieve Goals: Good Progress towards PT goals: Progressing toward goals    Frequency    7X/week      PT Plan      Co-evaluation              AM-PAC PT 6 Clicks Mobility   Outcome Measure  Help needed turning from your back to your side while in a flat bed without using bedrails?: A Little Help needed moving from lying on your back to sitting on the side of a flat bed without using bedrails?: A Little Help needed moving to and from a bed to a chair (including a wheelchair)?: A Little Help needed standing up from a chair using your arms (e.g., wheelchair or bedside chair)?: A Little Help needed to walk in hospital room?: A Little Help needed climbing 3-5 steps with a railing? : A Lot 6 Click Score: 17    End of Session Equipment Utilized During Treatment: Gait belt Activity Tolerance: Patient limited by fatigue;Patient limited by pain Patient left: with call bell/phone within reach;in bed;with bed alarm set Nurse Communication: Mobility status PT Visit Diagnosis: Unsteadiness on feet (R26.81);Other abnormalities of gait and mobility (R26.89);Muscle weakness (generalized) (M62.81);Difficulty in walking, not elsewhere classified (R26.2);Pain Pain - Right/Left: Left Pain - part of body: Knee     Time: 8566-8541 PT Time Calculation (min) (ACUTE ONLY): 25 min  Charges:    $Gait Training: 8-22 mins $Therapeutic Exercise: 8-22 mins PT General Charges $$ ACUTE PT VISIT: 1 Visit                     Sylvan Nest Kistler PT 03/11/2024  Acute Rehabilitation Services  Office [phone]

## 2024-03-11 NOTE — Progress Notes (Signed)
 Subjective: 2 Days Post-Op Procedure(s) (LRB): ARTHROPLASTY, KNEE, TOTAL (Left) Patient reports pain as mild.   Patient seen in rounds by Dr. Ernie. Patient is well, and has had no acute complaints or problems. No acute events overnight. She ambulated 28 feet with PT yesterday.  We will continue therapy today.   Objective: Vital signs in last 24 hours: Temp:  [97.8 F (36.6 C)-98.1 F (36.7 C)] 98 F (36.7 C) (09/18 0523) Pulse Rate:  [70-79] 70 (09/18 0523) Resp:  [14-18] 14 (09/18 0523) BP: (115-141)/(61-72) 115/72 (09/18 0523) SpO2:  [96 %-100 %] 96 % (09/18 0523) Weight:  [75.4 kg] 75.4 kg (09/17 1117)  Intake/Output from previous day:  Intake/Output Summary (Last 24 hours) at 03/11/2024 0719 Last data filed at 03/11/2024 0600 Gross per 24 hour  Intake 1064.79 ml  Output 700 ml  Net 364.79 ml     Intake/Output this shift: No intake/output data recorded.  Labs: Recent Labs    03/10/24 0316 03/11/24 0323  HGB 9.6* 8.3*   Recent Labs    03/10/24 0316 03/11/24 0323  WBC 13.5* 12.2*  RBC 3.10* 2.67*  HCT 31.1* 26.9*  PLT 210 181   Recent Labs    03/10/24 0316  NA 141  K 4.5  CL 105  CO2 23  BUN 26*  CREATININE 1.09*  GLUCOSE 137*  CALCIUM  9.6   No results for input(s): LABPT, INR in the last 72 hours.  Exam: General - Patient is Alert and Oriented Extremity - Neurologically intact Sensation intact distally Intact pulses distally Dorsiflexion/Plantar flexion intact Dressing - dressing C/D/I Motor Function - intact, moving foot and toes well on exam.   Past Medical History:  Diagnosis Date   ADD (attention deficit disorder)    Allergy    Anxiety    Asthma    Attention deficit disorder without mention of hyperactivity    Back pain    Colitis 12/05/2010   Colon polyps    Complication of anesthesia    problem with grogginess and a long time to get over Anesthesia   Depression    Diverticulosis    GERD (gastroesophageal reflux  disease)    H/O hiatal hernia    Heart murmur    mild to moderate AS per ECHO 05-2023   Hypertension    Hypothyroidism    IBS (irritable bowel syndrome)    MCI (mild cognitive impairment)    Memory changes    Osteoarthrosis, unspecified whether generalized or localized, unspecified site    RA   Other and unspecified hyperlipidemia    Renal cyst 10/2015   simple left   Repeated falls    Thyroid  disease    Ulcerative colitis    Unspecified asthma(493.90)    Unspecified essential hypertension    taken off meds since lost wt    Assessment/Plan: 2 Days Post-Op Procedure(s) (LRB): ARTHROPLASTY, KNEE, TOTAL (Left) Principal Problem:   S/P total knee arthroplasty, left  Estimated body mass index is 27.66 kg/m as calculated from the following:   Height as of this encounter: 5' 5 (1.651 m).   Weight as of this encounter: 75.4 kg. Advance diet Up with therapy D/C IV fluids    DVT Prophylaxis - Aspirin  Weight bearing as tolerated.  Hgb stable at 8.3 this AM  Up with PT again today Will require another night stay in order to progress to a safe level for discharge home with her current home help    Rosina Calin, [place] [facility] [phone] 03/11/2024,  7:19 AM

## 2024-03-12 MED ORDER — OXYCODONE HCL 5 MG PO TABS: 5.0000 mg | ORAL_TABLET | ORAL | 0 refills | Status: AC | PRN

## 2024-03-12 MED ORDER — POLYETHYLENE GLYCOL 3350 17 G PO PACK: 17.0000 g | PACK | Freq: Two times a day (BID) | ORAL | 0 refills | Status: AC

## 2024-03-12 MED ORDER — CELECOXIB 200 MG PO CAPS: 200.0000 mg | ORAL_CAPSULE | Freq: Every day | ORAL | 1 refills | Status: AC

## 2024-03-12 MED ORDER — ASPIRIN 81 MG PO CHEW: 81.0000 mg | CHEWABLE_TABLET | Freq: Two times a day (BID) | ORAL | 0 refills | Status: AC

## 2024-03-12 MED ORDER — ACETAMINOPHEN 500 MG PO TABS: 1000.0000 mg | ORAL_TABLET | Freq: Four times a day (QID) | ORAL | Status: AC

## 2024-03-12 MED ORDER — SENNA 8.6 MG PO TABS: 2.0000 | ORAL_TABLET | Freq: Every day | ORAL | 0 refills | Status: AC

## 2024-03-12 MED ORDER — METHOCARBAMOL 500 MG PO TABS: 500.0000 mg | ORAL_TABLET | Freq: Four times a day (QID) | ORAL | 2 refills | Status: AC | PRN

## 2024-03-12 NOTE — Progress Notes (Signed)
 Subjective: 3 Days Post-Op Procedure(s) (LRB): ARTHROPLASTY, KNEE, TOTAL (Left) Patient reports pain as mild.   Patient seen in rounds for Dr. Ernie. Patient is well, and has had no acute complaints or problems. She is resting in the recliner this morning. Her daughter just left for work. She did well yesterday, and does feel ready to go home. She is requesting HHPT.  We will start therapy today.   Objective: Vital signs in last 24 hours: Temp:  [97.4 F (36.3 C)-98.3 F (36.8 C)] 98.3 F (36.8 C) (09/19 0640) Pulse Rate:  [70-71] 70 (09/19 0640) Resp:  [16-18] 18 (09/19 0640) BP: (118-138)/(55-61) 138/59 (09/19 0640) SpO2:  [96 %-98 %] 98 % (09/19 0640)  Intake/Output from previous day:  Intake/Output Summary (Last 24 hours) at 03/12/2024 0930 Last data filed at 03/12/2024 0600 Gross per 24 hour  Intake 240 ml  Output --  Net 240 ml     Intake/Output this shift: No intake/output data recorded.  Labs: Recent Labs    03/10/24 0316 03/11/24 0323  HGB 9.6* 8.3*   Recent Labs    03/10/24 0316 03/11/24 0323  WBC 13.5* 12.2*  RBC 3.10* 2.67*  HCT 31.1* 26.9*  PLT 210 181   Recent Labs    03/10/24 0316  NA 141  K 4.5  CL 105  CO2 23  BUN 26*  CREATININE 1.09*  GLUCOSE 137*  CALCIUM  9.6   No results for input(s): LABPT, INR in the last 72 hours.  Exam: General - Patient is Alert and Oriented Extremity - Neurologically intact Sensation intact distally Intact pulses distally Dorsiflexion/Plantar flexion intact Dressing - dressing C/D/I Motor Function - intact, moving foot and toes well on exam.   Past Medical History:  Diagnosis Date   ADD (attention deficit disorder)    Allergy    Anxiety    Asthma    Attention deficit disorder without mention of hyperactivity    Back pain    Colitis 12/05/2010   Colon polyps    Complication of anesthesia    problem with grogginess and a long time to get over Anesthesia   Depression    Diverticulosis     GERD (gastroesophageal reflux disease)    H/O hiatal hernia    Heart murmur    mild to moderate AS per ECHO 05-2023   Hypertension    Hypothyroidism    IBS (irritable bowel syndrome)    MCI (mild cognitive impairment)    Memory changes    Osteoarthrosis, unspecified whether generalized or localized, unspecified site    RA   Other and unspecified hyperlipidemia    Renal cyst 10/2015   simple left   Repeated falls    Thyroid  disease    Ulcerative colitis    Unspecified asthma(493.90)    Unspecified essential hypertension    taken off meds since lost wt    Assessment/Plan: 3 Days Post-Op Procedure(s) (LRB): ARTHROPLASTY, KNEE, TOTAL (Left) Principal Problem:   S/P total knee arthroplasty, left  Estimated body mass index is 27.66 kg/m as calculated from the following:   Height as of this encounter: 5' 5 (1.651 m).   Weight as of this encounter: 75.4 kg. Advance diet Up with therapy D/C IV fluids    DVT Prophylaxis - Aspirin  Weight bearing as tolerated.  Will order HHPT for first 2 weeks Messaged NCM to reschedule OPPT for 2 weeks out  Home today after PT Daughter at work - will pick up later   Rosina Calin, PA-C Orthopedic  Surgery [phone] 03/12/2024, 9:30 AM

## 2024-03-12 NOTE — TOC Transition Note (Signed)
 Transition of Care [facility]) - Discharge Note   Patient Details  Name: [name] MRN: [id number] Date of Birth: February 24, 1946  [facility] CM/SW Contact:  NORMAN ASPEN, LCSW Phone Number: 03/12/2024, 10:54 AM   Clinical Narrative:     Plan now for pt to have HHPT and no agency preference - referral placed with [facility].  Have alerted ortho bundle CM, Lyle Pepper.  Has received RW to room already from [facility].  No further TOC needs.   Final next level of care: Home w Home Health Services Barriers to Discharge: No Barriers Identified   Patient Goals and CMS Choice Patient states their goals for this hospitalization and ongoing recovery are:: return home          Discharge Placement                       Discharge Plan and Services Additional resources added to the After Visit Summary for                  DME Arranged: Walker rolling DME Agency: [facility]       HH Arranged: PT HH Agency: [facility] Date [facility] Agency Contacted: 03/12/24 Time HH Agency Contacted: 1053 Representative spoke with at [facility] Agency: Burnard Bucks  Social Drivers of Health (SDOH) Interventions SDOH Screenings   Food Insecurity: No Food Insecurity (03/09/2024)  Housing: Low Risk  (03/09/2024)  Transportation Needs: No Transportation Needs (03/09/2024)  Utilities: Not At Risk (03/09/2024)  Financial Resource Strain: Low Risk  (07/01/2017)  Social Connections: Moderately Integrated (03/09/2024)  Tobacco Use: Medium Risk (03/09/2024)     Readmission Risk Interventions     No data to display

## 2024-03-12 NOTE — Plan of Care (Signed)
  Problem: Pain Management: Goal: Pain level will decrease with appropriate interventions Outcome: Progressing   Problem: Clinical Measurements: Goal: Postoperative complications will be avoided or minimized Outcome: Progressing   Problem: Activity: Goal: Range of joint motion will improve Outcome: Progressing   Problem: Safety: Goal: Ability to remain free from injury will improve Outcome: Progressing   Problem: Elimination: Goal: Will not experience complications related to urinary retention Outcome: Progressing   Problem: Elimination: Goal: Will not experience complications related to bowel motility Outcome: Progressing

## 2024-03-12 NOTE — Progress Notes (Signed)
 Physical Therapy Treatment Patient Details Name: [name] MRN: [id number] DOB: July 23, 1945 Today's Date: 03/12/2024   History of Present Illness 78 yo female presents to therapy s/p L TKA on 03/09/2024 due to failure of conservative measures. Pt PMH includes but is not limited to: OP, carpal tunnel syndrome, R THA, AA (2020), HOH, chronic pain, DDD lumbosacral region s/p surgery, GERD, anxiety, asthma, IBS, HTN, hypothyroidism, falls, L hip fx and surgery (2015), and R reverse TSA.    PT Comments   Pt admitted with above diagnosis.  Pt currently with functional limitations due to the deficits listed below (see PT Problem List). Pt in bed when PT arrived. Pt indicated that she had been up in the recliner earlier and it was hurting her back. Pt agreeable to therapy intervention, PT guided pt through supine LE TE and then seated TE EOB with pt requiring multimodal cues for technique and encouraged to progress with L knee ROM grossly 5-80 degrees in supine, pt required increased time use of hospital bed and CGA for supine <> sit, pt required cues, set up and CGA for transfer tasks bed and commode, pt required CGA for gait tasks with RW in hallway 110 feet with 3 standing therapeutic rest breaks due to B UE fatigue, pt encouraged to increase WB through B LE, cues for posture, proper distance from Rw and safety with turns and approach to sitting surfaces. Pt left in bed, all needs in place, iceman machine donned and pt ed on progression toward d/c. PT to return later in the day if pt remains. Pt will have HH services, caregiver and family support. Pt will benefit from acute skilled PT to increase their independence and safety with mobility to allow discharge.      If plan is discharge home, recommend the following: Assistance with cooking/housework;Assist for transportation;A little help with walking and/or transfers;A little help with bathing/dressing/bathroom   Can travel by private vehicle         Equipment Recommendations  None recommended by PT    Recommendations for Other Services       Precautions / Restrictions Precautions Precautions: Fall;Knee Precaution Booklet Issued: Yes (comment) Recall of Precautions/Restrictions: Intact Restrictions Weight Bearing Restrictions Per Provider Order: No     Mobility  Bed Mobility Overal bed mobility: Needs Assistance Bed Mobility: Supine to Sit, Sit to Supine     Supine to sit: HOB elevated, Used rails, Contact guard Sit to supine: Contact guard assist, HOB elevated, Used rails   General bed mobility comments: min cues and increased time. pt use of B UE to place L LE to EOB and hooked L LE with R LE to return to bed    Transfers Overall transfer level: Needs assistance Equipment used: Rolling walker (2 wheels) Transfers: Sit to/from Stand Sit to Stand: Contact guard assist           General transfer comment: min cues for safety, UE and AD placement for bed and commode transfers    Ambulation/Gait Ambulation/Gait assistance: Contact guard assist Gait Distance (Feet): 110 Feet Assistive device: Rolling walker (2 wheels) Gait Pattern/deviations: Step-to pattern, Decreased step length - right, Decreased step length - left, Decreased stance time - left, Antalgic, Trunk flexed Gait velocity: decreased     General Gait Details: B UE support at RW with pt reporting B UE fatigue requiring standing theraputic rest break, pt encouraged to increase B LE WB vs UE WB at RW, cues for safety and RW management   Stairs  Wheelchair Mobility     Tilt Bed    Modified Rankin (Stroke Patients Only)       Balance Overall balance assessment: History of Falls, Needs assistance Sitting-balance support: Feet supported Sitting balance-Leahy Scale: Good     Standing balance support: Bilateral upper extremity supported, During functional activity, Reliant on assistive device for balance Standing  balance-Leahy Scale: Fair Standing balance comment: static standing no UE support and mild postural sway                            Communication Communication Communication: Impaired Factors Affecting Communication: Hearing impaired  Cognition Arousal: Alert Behavior During Therapy: WFL for tasks assessed/performed   PT - Cognitive impairments: No apparent impairments                       PT - Cognition Comments: pt attention and recall improving pt requires occational repettive cues for technique Following commands: Intact Following commands impaired: Follows multi-step commands with increased time    Cueing Cueing Techniques: Verbal cues  Exercises Total Joint Exercises Ankle Circles/Pumps: AROM, Both, 10 reps, Supine Quad Sets: AROM, 5 reps, Supine, Left Heel Slides: AAROM, Left, Supine, 5 reps Hip ABduction/ADduction: AROM, Left, Supine, 5 reps Long Arc Quad: AROM, Left, 5 reps, Seated Knee Flexion: Seated, Left, AROM, 5 reps Goniometric ROM: L knee AROM grossly 5-80 in supine    General Comments        Pertinent Vitals/Pain Pain Assessment Pain Assessment: 0-10 Pain Score: 5  Pain Location: L knee and LBP Pain Descriptors / Indicators: Aching, Discomfort, Operative site guarding Pain Intervention(s): Limited activity within patient's tolerance, Monitored during session, Repositioned, Ice applied    Home Living                          Prior Function            PT Goals (current goals can now be found in the care plan section) Acute Rehab PT Goals Patient Stated Goal: to be able to move better at home PT Goal Formulation: With patient Time For Goal Achievement: 03/23/24 Potential to Achieve Goals: Good Progress towards PT goals: Progressing toward goals    Frequency    7X/week      PT Plan      Co-evaluation              AM-PAC PT 6 Clicks Mobility   Outcome Measure  Help needed turning from your back  to your side while in a flat bed without using bedrails?: A Little Help needed moving from lying on your back to sitting on the side of a flat bed without using bedrails?: A Little Help needed moving to and from a bed to a chair (including a wheelchair)?: A Little Help needed standing up from a chair using your arms (e.g., wheelchair or bedside chair)?: A Little Help needed to walk in hospital room?: A Little Help needed climbing 3-5 steps with a railing? : A Lot 6 Click Score: 17    End of Session Equipment Utilized During Treatment: Gait belt Activity Tolerance: Patient limited by fatigue Patient left: with call bell/phone within reach;in bed;with bed alarm set Nurse Communication: Mobility status PT Visit Diagnosis: Unsteadiness on feet (R26.81);Other abnormalities of gait and mobility (R26.89);Muscle weakness (generalized) (M62.81);Difficulty in walking, not elsewhere classified (R26.2);Pain Pain - Right/Left: Left Pain - part of body: Knee  Time: 8970-8896 PT Time Calculation (min) (ACUTE ONLY): 34 min  Charges:    $Gait Training: 8-22 mins $Therapeutic Exercise: 8-22 mins PT General Charges $$ ACUTE PT VISIT: 1 Visit                     [place], PT Acute Rehab    [place] VEAR Drone 03/12/2024, 12:01 PM

## 2024-03-12 NOTE — Progress Notes (Signed)
 Physical Therapy Treatment Patient Details Name: [name] MRN: [id number] DOB: 06-19-1946 Today's Date: 03/12/2024   History of Present Illness 78 yo female presents to therapy s/p L TKA on 03/09/2024 due to failure of conservative measures. Pt PMH includes but is not limited to: OP, carpal tunnel syndrome, R THA, AA (2020), HOH, chronic pain, DDD lumbosacral region s/p surgery, GERD, anxiety, asthma, IBS, HTN, hypothyroidism, falls, L hip fx and surgery (2015), and R reverse TSA.    PT Comments   Pt admitted with above diagnosis.  Pt currently with functional limitations due to the deficits listed below (see PT Problem List). Pt seated EOB when PT arrived. Daughter present. Pt to d/c home today. Pt required cues and CGA for sit to stand  from EOB and from elevated commode seat cues for proper UE and AD placement, gait tasks in personal room 18 feet and in hallway 130 with cues, RW and CGA. Pt and family ed provided on lower body dressing, use of ice man machine, fall risk prevention, car transfers and no pillow under L knee. Pt has adequately met goals for safe transition to home setting with family support and [facility] services.     If plan is discharge home, recommend the following: Assistance with cooking/housework;Assist for transportation;A little help with walking and/or transfers;A little help with bathing/dressing/bathroom   Can travel by private vehicle        Equipment Recommendations  None recommended by PT    Recommendations for Other Services       Precautions / Restrictions Precautions Precautions: Fall;Knee Precaution Booklet Issued: Yes (comment) Recall of Precautions/Restrictions: Intact Precaution/Restrictions Comments: reviewed no pillow under knee, car transfers, use of ice man machine and threading LLE first with lower body dressing Restrictions Weight Bearing Restrictions Per Provider Order: No     Mobility  Bed Mobility Overal bed mobility: Needs  Assistance Bed Mobility: Supine to Sit, Sit to Supine     Supine to sit: HOB elevated, Used rails, Contact guard Sit to supine: Contact guard assist, HOB elevated, Used rails   General bed mobility comments: pt seated EOB when PT arrived.    Transfers Overall transfer level: Needs assistance Equipment used: Rolling walker (2 wheels) Transfers: Sit to/from Stand Sit to Stand: Contact guard assist           General transfer comment: min cues for safety, UE and AD placement for bed and commode transfers    Ambulation/Gait Ambulation/Gait assistance: Contact guard assist, Supervision Gait Distance (Feet): 1130 Feet Assistive device: Rolling walker (2 wheels) Gait Pattern/deviations: Step-to pattern, Decreased step length - right, Decreased step length - left, Decreased stance time - left, Antalgic, Trunk flexed Gait velocity: decreased     General Gait Details: B UE support at RW with pt reporting B UE fatigue pt encouraged to increase B LE WB vs UE WB at RW, cues for safety and RW management and L knee flexion with swing phase   Stairs             Wheelchair Mobility     Tilt Bed    Modified Rankin (Stroke Patients Only)       Balance Overall balance assessment: History of Falls, Needs assistance Sitting-balance support: Feet supported Sitting balance-Leahy Scale: Good     Standing balance support: Bilateral upper extremity supported, During functional activity, Reliant on assistive device for balance Standing balance-Leahy Scale: Fair Standing balance comment: static standing no UE support at sink  no overt LOB  Communication Communication Communication: Impaired Factors Affecting Communication: Hearing impaired  Cognition Arousal: Alert Behavior During Therapy: WFL for tasks assessed/performed   PT - Cognitive impairments: No apparent impairments                       PT - Cognition Comments: pt  attention and recall improving pt requires occational repettive cues for technique Following commands: Intact Following commands impaired: Follows multi-step commands with increased time    Cueing Cueing Techniques: Verbal cues  Exercises Total Joint Exercises Ankle Circles/Pumps: AROM, Both, 10 reps, Supine Quad Sets: AROM, 5 reps, Supine, Left Heel Slides: AAROM, Left, Supine, 5 reps Hip ABduction/ADduction: AROM, Left, Supine, 5 reps Long Arc Quad: AROM, Left, 5 reps, Seated Knee Flexion: Seated, Left, AROM, 5 reps Goniometric ROM: L knee AROM grossly 0-80 degrees with mobility tasks    General Comments        Pertinent Vitals/Pain Pain Assessment Pain Assessment: 0-10 Pain Score: 5  Faces Pain Scale: Hurts little more Pain Location: L knee and LBP Pain Descriptors / Indicators: Aching, Discomfort, Operative site guarding Pain Intervention(s): Limited activity within patient's tolerance, Monitored during session, Premedicated before session, Repositioned    Home Living                          Prior Function            PT Goals (current goals can now be found in the care plan section) Acute Rehab PT Goals Patient Stated Goal: to be able to move better at home PT Goal Formulation: With patient Time For Goal Achievement: 03/23/24 Potential to Achieve Goals: Good Progress towards PT goals: Progressing toward goals    Frequency    7X/week      PT Plan      Co-evaluation              AM-PAC PT 6 Clicks Mobility   Outcome Measure  Help needed turning from your back to your side while in a flat bed without using bedrails?: A Little Help needed moving from lying on your back to sitting on the side of a flat bed without using bedrails?: A Little Help needed moving to and from a bed to a chair (including a wheelchair)?: A Little Help needed standing up from a chair using your arms (e.g., wheelchair or bedside chair)?: A Little Help needed to  walk in hospital room?: A Little Help needed climbing 3-5 steps with a railing? : A Lot 6 Click Score: 17    End of Session Equipment Utilized During Treatment: Gait belt Activity Tolerance: Patient tolerated treatment well Patient left: with call bell/phone within reach;in bed;with family/visitor present (seated EOB) Nurse Communication: Mobility status PT Visit Diagnosis: Unsteadiness on feet (R26.81);Other abnormalities of gait and mobility (R26.89);Muscle weakness (generalized) (M62.81);Difficulty in walking, not elsewhere classified (R26.2);Pain Pain - Right/Left: Left Pain - part of body: Knee     Time: 8492-8464 PT Time Calculation (min) (ACUTE ONLY): 28 min  Charges:    $Gait Training: 8-22 mins $Therapeutic Activity: 8-22 mins PT General Charges $$ ACUTE PT VISIT: 1 Visit                     [place], PT Acute Rehab    [place] VEAR Drone 03/12/2024, 5:26 PM

## 2024-03-15 DIAGNOSIS — M533 Sacrococcygeal disorders, not elsewhere classified: Secondary | ICD-10-CM | POA: Diagnosis not present

## 2024-03-15 DIAGNOSIS — I10 Essential (primary) hypertension: Secondary | ICD-10-CM | POA: Diagnosis not present

## 2024-03-15 DIAGNOSIS — Z7982 Long term (current) use of aspirin: Secondary | ICD-10-CM | POA: Diagnosis not present

## 2024-03-15 DIAGNOSIS — K589 Irritable bowel syndrome without diarrhea: Secondary | ICD-10-CM | POA: Diagnosis not present

## 2024-03-15 DIAGNOSIS — Z791 Long term (current) use of non-steroidal anti-inflammatories (NSAID): Secondary | ICD-10-CM | POA: Diagnosis not present

## 2024-03-15 DIAGNOSIS — Z79891 Long term (current) use of opiate analgesic: Secondary | ICD-10-CM | POA: Diagnosis not present

## 2024-03-15 DIAGNOSIS — J45909 Unspecified asthma, uncomplicated: Secondary | ICD-10-CM | POA: Diagnosis not present

## 2024-03-15 DIAGNOSIS — Z7951 Long term (current) use of inhaled steroids: Secondary | ICD-10-CM | POA: Diagnosis not present

## 2024-03-15 DIAGNOSIS — R32 Unspecified urinary incontinence: Secondary | ICD-10-CM | POA: Diagnosis not present

## 2024-03-15 DIAGNOSIS — R296 Repeated falls: Secondary | ICD-10-CM | POA: Diagnosis not present

## 2024-03-15 DIAGNOSIS — M810 Age-related osteoporosis without current pathological fracture: Secondary | ICD-10-CM | POA: Diagnosis not present

## 2024-03-15 DIAGNOSIS — E7800 Pure hypercholesterolemia, unspecified: Secondary | ICD-10-CM | POA: Diagnosis not present

## 2024-03-15 DIAGNOSIS — Z452 Encounter for adjustment and management of vascular access device: Secondary | ICD-10-CM | POA: Diagnosis not present

## 2024-03-15 DIAGNOSIS — E039 Hypothyroidism, unspecified: Secondary | ICD-10-CM | POA: Diagnosis not present

## 2024-03-15 DIAGNOSIS — H9193 Unspecified hearing loss, bilateral: Secondary | ICD-10-CM | POA: Diagnosis not present

## 2024-03-15 DIAGNOSIS — K219 Gastro-esophageal reflux disease without esophagitis: Secondary | ICD-10-CM | POA: Diagnosis not present

## 2024-03-15 DIAGNOSIS — K5790 Diverticulosis of intestine, part unspecified, without perforation or abscess without bleeding: Secondary | ICD-10-CM | POA: Diagnosis not present

## 2024-03-15 DIAGNOSIS — Z79620 Long term (current) use of immunosuppressive biologic: Secondary | ICD-10-CM | POA: Diagnosis not present

## 2024-03-15 DIAGNOSIS — F411 Generalized anxiety disorder: Secondary | ICD-10-CM | POA: Diagnosis not present

## 2024-03-15 DIAGNOSIS — J3501 Chronic tonsillitis: Secondary | ICD-10-CM | POA: Diagnosis not present

## 2024-03-15 DIAGNOSIS — F32A Depression, unspecified: Secondary | ICD-10-CM | POA: Diagnosis not present

## 2024-03-15 DIAGNOSIS — G3184 Mild cognitive impairment, so stated: Secondary | ICD-10-CM | POA: Diagnosis not present

## 2024-03-15 DIAGNOSIS — M069 Rheumatoid arthritis, unspecified: Secondary | ICD-10-CM | POA: Diagnosis not present

## 2024-03-15 DIAGNOSIS — Z471 Aftercare following joint replacement surgery: Secondary | ICD-10-CM | POA: Diagnosis not present

## 2024-03-15 DIAGNOSIS — M5116 Intervertebral disc disorders with radiculopathy, lumbar region: Secondary | ICD-10-CM | POA: Diagnosis not present

## 2024-03-19 DIAGNOSIS — F411 Generalized anxiety disorder: Secondary | ICD-10-CM | POA: Diagnosis not present

## 2024-03-19 DIAGNOSIS — J45909 Unspecified asthma, uncomplicated: Secondary | ICD-10-CM | POA: Diagnosis not present

## 2024-03-19 DIAGNOSIS — I10 Essential (primary) hypertension: Secondary | ICD-10-CM | POA: Diagnosis not present

## 2024-03-19 DIAGNOSIS — M069 Rheumatoid arthritis, unspecified: Secondary | ICD-10-CM | POA: Diagnosis not present

## 2024-03-19 DIAGNOSIS — Z471 Aftercare following joint replacement surgery: Secondary | ICD-10-CM | POA: Diagnosis not present

## 2024-03-19 DIAGNOSIS — F32A Depression, unspecified: Secondary | ICD-10-CM | POA: Diagnosis not present

## 2024-03-19 NOTE — Discharge Summary (Signed)
 Patient ID: [name] MRN: [id number] DOB/AGE: 78/18/1947 78 y.o.  Admit date: 03/09/2024 Discharge date: 03/12/2024  Admission Diagnoses:  Left knee osteoarthritis  Discharge Diagnoses:  Principal Problem:   S/P total knee arthroplasty, left   Past Medical History:  Diagnosis Date   ADD (attention deficit disorder)    Allergy    Anxiety    Asthma    Attention deficit disorder without mention of hyperactivity    Back pain    Colitis 12/05/2010   Colon polyps    Complication of anesthesia    problem with grogginess and a long time to get over Anesthesia   Depression    Diverticulosis    GERD (gastroesophageal reflux disease)    H/O hiatal hernia    Heart murmur    mild to moderate AS per ECHO 05-2023   Hypertension    Hypothyroidism    IBS (irritable bowel syndrome)    MCI (mild cognitive impairment)    Memory changes    Osteoarthrosis, unspecified whether generalized or localized, unspecified site    RA   Other and unspecified hyperlipidemia    Renal cyst 10/2015   simple left   Repeated falls    Thyroid  disease    Ulcerative colitis    Unspecified asthma(493.90)    Unspecified essential hypertension    taken off meds since lost wt    Surgeries: Procedure(s): ARTHROPLASTY, KNEE, TOTAL on 03/09/2024   Consultants:   Discharged Condition: Improved  Hospital Course: [name] is an 78 y.o. female who was admitted 03/09/2024 for operative treatment ofS/P total knee arthroplasty, left. Patient has severe unremitting pain that affects sleep, daily activities, and work/hobbies. After pre-op clearance the patient was taken to the operating room on 03/09/2024 and underwent  Procedure(s): ARTHROPLASTY, KNEE, TOTAL.    Patient was given perioperative antibiotics:  Anti-infectives (From admission, onward)    Start     Dose/Rate Route Frequency Ordered Stop   03/09/24 1800  ceFAZolin  (ANCEF ) IVPB 2g/100 mL premix        2 g 200 mL/hr  over 30 Minutes Intravenous Every 6 hours 03/09/24 1536 03/09/24 2356   03/09/24 0915  ceFAZolin  (ANCEF ) IVPB 2g/100 mL premix        2 g 200 mL/hr over 30 Minutes Intravenous On call to O.R. 03/09/24 0901 03/09/24 1103        Patient was given sequential compression devices, early ambulation, and chemoprophylaxis to prevent DVT. Patient worked with PT and was meeting their goals regarding safe ambulation and transfers.  Patient benefited maximally from hospital stay and there were no complications.    Recent vital signs: No data found.   Recent laboratory studies: No results for input(s): WBC, HGB, HCT, PLT, NA, K, CL, CO2, BUN, CREATININE, GLUCOSE, INR, CALCIUM  in the last 72 hours.  Invalid input(s): PT, 2   Discharge Medications:   Allergies as of 03/12/2024       Reactions   Ampicillin Nausea And Vomiting   Bee Pollen    Other reaction(s): Other (See Comments)   Erythromycin Nausea And Vomiting, Other (See Comments)   Can take Z pac   Hctz [hydrochlorothiazide ] Other (See Comments)   dizzy   Metoprolol  Other (See Comments)   doublevision    Molds & Smuts Other (See Comments)   Pt coughs and uses an inhaler.   Morphine And Codeine Nausea And Vomiting   No problem with hydrocodone    Penicillin G Other (See Comments)   Unknown Has patient  had a PCN reaction causing immediate rash, facial/tongue/throat swelling, SOB or lightheadedness with hypotension: Unknown Has patient had a PCN reaction causing severe rash involving mucus membranes or skin necrosis: Unknown Has patient had a PCN reaction that required hospitalization: Unknown Has patient had a PCN reaction occurring within the last 10 years: Unknown If all of the above answers are NO, then may proceed with Cephalosporin use.        Medication List     STOP taking these medications    aspirin  EC 81 MG tablet Replaced by: aspirin  81 MG chewable tablet   bacitracin  ointment    cefadroxil  500 MG capsule Commonly known as: DURICEF   HYDROcodone -acetaminophen  5-325 MG tablet Commonly known as: NORCO/VICODIN       TAKE these medications    acetaminophen  500 MG tablet Commonly known as: TYLENOL  Take 2 tablets (1,000 mg total) by mouth every 6 (six) hours.   AeroChamber MV inhaler Use as instructed   albuterol  108 (90 Base) MCG/ACT inhaler Commonly known as: VENTOLIN  HFA Inhale 2 puffs into the lungs every 6 (six) hours as needed for wheezing or shortness of breath.   ALPRAZolam  0.25 MG tablet Commonly known as: XANAX  Take 0.25 mg by mouth 2 (two) times daily as needed for anxiety or sleep.   amLODipine -valsartan  5-160 MG tablet Commonly known as: EXFORGE  Take 1 tablet by mouth daily.   aspirin  81 MG chewable tablet Chew 1 tablet (81 mg total) by mouth 2 (two) times daily for 28 days. Replaces: aspirin  EC 81 MG tablet   budesonide -formoterol  160-4.5 MCG/ACT inhaler Commonly known as: Symbicort  Inhale 2 puffs into the lungs 2 (two) times daily. in the morning and at bedtime.   buPROPion  300 MG 24 hr tablet Commonly known as: WELLBUTRIN  XL Take 1 tablet (300 mg total) by mouth daily.   celecoxib  200 MG capsule Commonly known as: CELEBREX  Take 1 capsule (200 mg total) by mouth daily.   Concerta  36 MG CR tablet Generic drug: methylphenidate  Take 36 mg by mouth daily.   FLUoxetine  40 MG capsule Commonly known as: PROZAC  Take 40 mg by mouth daily.   FLUoxetine  10 MG capsule Commonly known as: PROZAC  Take 10 mg by mouth daily.   inFLIXimab  100 MG injection Commonly known as: REMICADE  IV infusion every 2 months   lamoTRIgine  100 MG tablet Commonly known as: LAMICTAL  Take 100-200 mg by mouth See admin instructions. Take 100 mg by mouth in the morning and 200 mg at bedtime   loratadine 10 MG tablet Commonly known as: CLARITIN Take 10 mg by mouth daily.   methocarbamol  500 MG tablet Commonly known as: ROBAXIN  Take 1 tablet (500 mg  total) by mouth every 6 (six) hours as needed for muscle spasms.   Nexlizet  180-10 MG Tabs Generic drug: Bempedoic Acid-Ezetimibe TAKE ONE TABLET DAILY AT 12 NOON   oxyCODONE  5 MG immediate release tablet Commonly known as: Oxy IR/ROXICODONE  Take 1 tablet (5 mg total) by mouth every 4 (four) hours as needed for severe pain (pain score 7-10).   pantoprazole  40 MG tablet Commonly known as: PROTONIX  TAKE ONE TABLET TWICE DAILY   polyethylene glycol 17 g packet Commonly known as: MIRALAX  / GLYCOLAX  Take 17 g by mouth 2 (two) times daily.   rosuvastatin  10 MG tablet Commonly known as: CRESTOR  Take 10 mg by mouth daily.   senna 8.6 MG Tabs tablet Commonly known as: SENOKOT Take 2 tablets (17.2 mg total) by mouth at bedtime for 14 days.   Synthroid   50 MCG tablet Generic drug: levothyroxine  TAKE 2 TABLETS EVERY MORNING What changed:  how much to take when to take this   VITAMIN D  PO Take 1 tablet by mouth daily.               Discharge Care Instructions  (From admission, onward)           Start     Ordered   03/12/24 0000  Change dressing       Comments: Maintain surgical dressing until follow up in the clinic. If the edges start to pull up, may reinforce with tape. If the dressing is no longer working, may remove and cover with gauze and tape, but must keep the area dry and clean.  Call with any questions or concerns.   03/12/24 0936            Diagnostic Studies: No results found.  Disposition: Discharge disposition: 01-Home or Self Care       Discharge Instructions     Call MD / Call 911   Complete by: As directed    If you experience chest pain or shortness of breath, CALL 911 and be transported to the hospital emergency room.  If you develope a fever above 101 F, pus (white drainage) or increased drainage or redness at the wound, or calf pain, call your surgeon's office.   Change dressing   Complete by: As directed    Maintain surgical  dressing until follow up in the clinic. If the edges start to pull up, may reinforce with tape. If the dressing is no longer working, may remove and cover with gauze and tape, but must keep the area dry and clean.  Call with any questions or concerns.   Constipation Prevention   Complete by: As directed    Drink plenty of fluids.  Prune juice may be helpful.  You may use a stool softener, such as Colace (over the counter) 100 mg twice a day.  Use MiraLax  (over the counter) for constipation as needed.   Diet - low sodium heart healthy   Complete by: As directed    Increase activity slowly as tolerated   Complete by: As directed    Weight bearing as tolerated with assist device (walker, cane, etc) as directed, use it as long as suggested by your surgeon or therapist, typically at least 4-6 weeks.   Post-operative opioid taper instructions:   Complete by: As directed    POST-OPERATIVE OPIOID TAPER INSTRUCTIONS: It is important to wean off of your opioid medication as soon as possible. If you do not need pain medication after your surgery it is ok to stop day one. Opioids include: Codeine, Hydrocodone (Norco, Vicodin), Oxycodone (Percocet, oxycontin ) and hydromorphone  amongst others.  Long term and even short term use of opiods can cause: Increased pain response Dependence Constipation Depression Respiratory depression And more.  Withdrawal symptoms can include Flu like symptoms Nausea, vomiting And more Techniques to manage these symptoms Hydrate well Eat regular healthy meals Stay active Use relaxation techniques(deep breathing, meditating, yoga) Do Not substitute Alcohol  to help with tapering If you have been on opioids for less than two weeks and do not have pain than it is ok to stop all together.  Plan to wean off of opioids This plan should start within one week post op of your joint replacement. Maintain the same interval or time between taking each dose and first decrease the  dose.  Cut the total daily intake of opioids by one  tablet each day Next start to increase the time between doses. The last dose that should be eliminated is the evening dose.      TED hose   Complete by: As directed    Use stockings (TED hose) for 2 weeks on both leg(s).  You may remove them at night for sleeping.        Follow-up Information     [facility] Go on 03/12/2024.   Why: You are scheduled for physical therapy Friday 03/12/24 at 10:30am; [address] Contact information: [address] [place] [address] [phone]         Patti Rosina SAUNDERS, PA-C. Go on 03/24/2024.   Specialty: Orthopedic Surgery Why: You are scheduled for a post op appointment on Wednesday 03/24/24 at 3:00pm Contact information: [address] [place] [address] [phone]                  Signed: Rosina SAUNDERS Patti 03/19/2024, 4:59 PM

## 2024-03-22 DIAGNOSIS — J45909 Unspecified asthma, uncomplicated: Secondary | ICD-10-CM | POA: Diagnosis not present

## 2024-03-22 DIAGNOSIS — M069 Rheumatoid arthritis, unspecified: Secondary | ICD-10-CM | POA: Diagnosis not present

## 2024-03-22 DIAGNOSIS — I10 Essential (primary) hypertension: Secondary | ICD-10-CM | POA: Diagnosis not present

## 2024-03-22 DIAGNOSIS — F32A Depression, unspecified: Secondary | ICD-10-CM | POA: Diagnosis not present

## 2024-03-22 DIAGNOSIS — F411 Generalized anxiety disorder: Secondary | ICD-10-CM | POA: Diagnosis not present

## 2024-03-22 DIAGNOSIS — Z471 Aftercare following joint replacement surgery: Secondary | ICD-10-CM | POA: Diagnosis not present

## 2024-03-23 DIAGNOSIS — F32A Depression, unspecified: Secondary | ICD-10-CM | POA: Diagnosis not present

## 2024-03-23 DIAGNOSIS — F411 Generalized anxiety disorder: Secondary | ICD-10-CM | POA: Diagnosis not present

## 2024-03-23 DIAGNOSIS — J45909 Unspecified asthma, uncomplicated: Secondary | ICD-10-CM | POA: Diagnosis not present

## 2024-03-23 DIAGNOSIS — I10 Essential (primary) hypertension: Secondary | ICD-10-CM | POA: Diagnosis not present

## 2024-03-23 DIAGNOSIS — M069 Rheumatoid arthritis, unspecified: Secondary | ICD-10-CM | POA: Diagnosis not present

## 2024-03-23 DIAGNOSIS — Z471 Aftercare following joint replacement surgery: Secondary | ICD-10-CM | POA: Diagnosis not present

## 2024-03-25 DIAGNOSIS — M069 Rheumatoid arthritis, unspecified: Secondary | ICD-10-CM | POA: Diagnosis not present

## 2024-03-25 DIAGNOSIS — F411 Generalized anxiety disorder: Secondary | ICD-10-CM | POA: Diagnosis not present

## 2024-03-25 DIAGNOSIS — Z471 Aftercare following joint replacement surgery: Secondary | ICD-10-CM | POA: Diagnosis not present

## 2024-03-25 DIAGNOSIS — I10 Essential (primary) hypertension: Secondary | ICD-10-CM | POA: Diagnosis not present

## 2024-03-25 DIAGNOSIS — F32A Depression, unspecified: Secondary | ICD-10-CM | POA: Diagnosis not present

## 2024-03-25 DIAGNOSIS — J45909 Unspecified asthma, uncomplicated: Secondary | ICD-10-CM | POA: Diagnosis not present

## 2024-03-29 DIAGNOSIS — Z471 Aftercare following joint replacement surgery: Secondary | ICD-10-CM | POA: Diagnosis not present

## 2024-03-29 DIAGNOSIS — M069 Rheumatoid arthritis, unspecified: Secondary | ICD-10-CM | POA: Diagnosis not present

## 2024-03-29 DIAGNOSIS — J45909 Unspecified asthma, uncomplicated: Secondary | ICD-10-CM | POA: Diagnosis not present

## 2024-03-29 DIAGNOSIS — F411 Generalized anxiety disorder: Secondary | ICD-10-CM | POA: Diagnosis not present

## 2024-03-29 DIAGNOSIS — I10 Essential (primary) hypertension: Secondary | ICD-10-CM | POA: Diagnosis not present

## 2024-03-29 DIAGNOSIS — F32A Depression, unspecified: Secondary | ICD-10-CM | POA: Diagnosis not present

## 2024-04-01 DIAGNOSIS — M25562 Pain in left knee: Secondary | ICD-10-CM | POA: Diagnosis not present

## 2024-04-07 DIAGNOSIS — D692 Other nonthrombocytopenic purpura: Secondary | ICD-10-CM | POA: Diagnosis not present

## 2024-04-07 DIAGNOSIS — L821 Other seborrheic keratosis: Secondary | ICD-10-CM | POA: Diagnosis not present

## 2024-04-07 DIAGNOSIS — L810 Postinflammatory hyperpigmentation: Secondary | ICD-10-CM | POA: Diagnosis not present

## 2024-04-07 DIAGNOSIS — L570 Actinic keratosis: Secondary | ICD-10-CM | POA: Diagnosis not present

## 2024-04-09 DIAGNOSIS — M25562 Pain in left knee: Secondary | ICD-10-CM | POA: Diagnosis not present

## 2024-04-13 ENCOUNTER — Ambulatory Visit (HOSPITAL_COMMUNITY)
Admission: RE | Admit: 2024-04-13 | Discharge: 2024-04-13 | Disposition: A | Discharge status: Home / Self Care | Source: Ambulatory Visit | Attending: Internal Medicine | Admitting: Internal Medicine

## 2024-04-13 VITALS — BP 92/74 | HR 68 | Temp 97.3°F | Resp 17

## 2024-04-13 DIAGNOSIS — M810 Age-related osteoporosis without current pathological fracture: Secondary | ICD-10-CM | POA: Diagnosis not present

## 2024-04-13 MED ORDER — DENOSUMAB 60 MG/ML ~~LOC~~ SOSY
PREFILLED_SYRINGE | SUBCUTANEOUS | Status: AC
  Filled 2024-04-13: qty 1

## 2024-04-13 MED ORDER — DENOSUMAB 60 MG/ML ~~LOC~~ SOSY
60.0000 mg | PREFILLED_SYRINGE | Freq: Once | SUBCUTANEOUS | Status: AC
  Administered 2024-04-13: 60 mg via SUBCUTANEOUS

## 2024-04-14 DIAGNOSIS — M25562 Pain in left knee: Secondary | ICD-10-CM | POA: Diagnosis not present

## 2024-04-15 DIAGNOSIS — K5190 Ulcerative colitis, unspecified, without complications: Secondary | ICD-10-CM | POA: Diagnosis not present

## 2024-04-16 DIAGNOSIS — M25562 Pain in left knee: Secondary | ICD-10-CM | POA: Diagnosis not present

## 2024-04-20 DIAGNOSIS — M25562 Pain in left knee: Secondary | ICD-10-CM | POA: Diagnosis not present

## 2024-04-23 DIAGNOSIS — M25562 Pain in left knee: Secondary | ICD-10-CM | POA: Diagnosis not present

## 2024-04-27 DIAGNOSIS — M25562 Pain in left knee: Secondary | ICD-10-CM | POA: Diagnosis not present

## 2024-04-29 DIAGNOSIS — M25562 Pain in left knee: Secondary | ICD-10-CM | POA: Diagnosis not present

## 2024-05-04 DIAGNOSIS — F900 Attention-deficit hyperactivity disorder, predominantly inattentive type: Secondary | ICD-10-CM | POA: Diagnosis not present

## 2024-05-04 DIAGNOSIS — F319 Bipolar disorder, unspecified: Secondary | ICD-10-CM | POA: Diagnosis not present

## 2024-05-28 DIAGNOSIS — M25562 Pain in left knee: Secondary | ICD-10-CM | POA: Diagnosis not present

## 2024-06-01 DIAGNOSIS — M25562 Pain in left knee: Secondary | ICD-10-CM | POA: Diagnosis not present

## 2024-06-03 DIAGNOSIS — L814 Other melanin hyperpigmentation: Secondary | ICD-10-CM | POA: Diagnosis not present

## 2024-06-03 DIAGNOSIS — L918 Other hypertrophic disorders of the skin: Secondary | ICD-10-CM | POA: Diagnosis not present

## 2024-06-03 DIAGNOSIS — L821 Other seborrheic keratosis: Secondary | ICD-10-CM | POA: Diagnosis not present

## 2024-06-03 DIAGNOSIS — L244 Irritant contact dermatitis due to drugs in contact with skin: Secondary | ICD-10-CM | POA: Diagnosis not present

## 2024-06-04 DIAGNOSIS — M25562 Pain in left knee: Secondary | ICD-10-CM | POA: Diagnosis not present

## 2024-06-07 ENCOUNTER — Other Ambulatory Visit: Payer: Self-pay | Admitting: Cardiology

## 2024-06-07 DIAGNOSIS — I6523 Occlusion and stenosis of bilateral carotid arteries: Secondary | ICD-10-CM

## 2024-06-07 DIAGNOSIS — I700 Atherosclerosis of aorta: Secondary | ICD-10-CM

## 2024-06-07 DIAGNOSIS — I2510 Atherosclerotic heart disease of native coronary artery without angina pectoris: Secondary | ICD-10-CM

## 2024-06-08 DIAGNOSIS — M25562 Pain in left knee: Secondary | ICD-10-CM | POA: Diagnosis not present

## 2024-07-01 ENCOUNTER — Telehealth (HOSPITAL_BASED_OUTPATIENT_CLINIC_OR_DEPARTMENT_OTHER): Payer: Self-pay

## 2024-07-01 NOTE — Telephone Encounter (Signed)
"  ° °  Pre-operative Risk Assessment    Patient Name: [name] [facility]  DOB: 1946/03/25 MRN: [id number]   Date of last office visit: 09/05/23 with Michele Date of next office visit: NA  Request for Surgical Clearance    Procedure:  Right Total knee Arthroplasty  Date of Surgery:  Clearance 09/23/24                                  Surgeon:  Dr. Ernie Socks Group or Practice Name:  [facility] Phone number:  [phone] Fax number:  [phone]   Type of Clearance Requested:   - Medical    Type of Anesthesia:  Spinal   Additional requests/questions:    SignedAugustin JONETTA Daring   07/01/2024, 3:44 PM   "

## 2024-07-02 NOTE — Telephone Encounter (Signed)
 Tried contacting patient to schedule IN OFFICE VISIT no answer left a detailed vm to call back and schedule

## 2024-07-02 NOTE — Telephone Encounter (Signed)
" ° °  Name: [name] [facility]  DOB: 1945-11-30  MRN: [id number]  Primary Cardiologist: Tammy Large, DO  Chart reviewed as part of pre-operative protocol coverage. Because of [name]'s past medical history and time since last visit, she will require a follow-up in-office visit in order to better assess preoperative cardiovascular risk.  Patient is due for 1 year follow-up with Dr. Large in 08/2024.  Procedure is not scheduled until 09/23/2024.  Pre-op covering staff: - Please schedule appointment and call patient to inform them. If patient already had an upcoming appointment within acceptable timeframe, please add pre-op clearance to the appointment notes so provider is aware. - Please contact requesting surgeon's office via preferred method (i.e, phone, fax) to inform them of need for appointment prior to surgery.    Tammy JAYSON Braver, NP  07/02/2024, 7:12 AM   "

## 2024-07-05 NOTE — Telephone Encounter (Signed)
 Called and left the patient a detailed message to schedule pre-op  clearance. 2nd attempt.

## 2024-07-06 ENCOUNTER — Encounter (HOSPITAL_BASED_OUTPATIENT_CLINIC_OR_DEPARTMENT_OTHER): Payer: Self-pay

## 2024-07-06 NOTE — Telephone Encounter (Signed)
 Unable to reach patient regarding appointment.  MyChart message sent

## 2024-07-06 NOTE — Telephone Encounter (Signed)
 Called patient, no current dpr. Didn't leave a voicemail. Did send a mychart message asking for a call back.

## 2024-07-15 NOTE — Telephone Encounter (Signed)
 Pt called back and she has been scheduled in office appt 09/09/24 @ 10:20 Thom Sluder, PAC. Pt has echo on 08/31/24 as well. Pt will need preop clearance at 09/09/24 appt.   I will update all parties involved.

## 2024-08-31 ENCOUNTER — Ambulatory Visit (HOSPITAL_COMMUNITY)

## 2024-09-09 ENCOUNTER — Ambulatory Visit: Admitting: Cardiology

## 2024-09-23 ENCOUNTER — Ambulatory Visit (HOSPITAL_COMMUNITY): Admit: 2024-09-23 | Admitting: Orthopedic Surgery

## 2024-09-23 SURGERY — ARTHROPLASTY, KNEE, TOTAL
Anesthesia: Spinal | Site: Knee | Laterality: Right

## 2024-10-13 ENCOUNTER — Encounter (HOSPITAL_COMMUNITY)
